# Patient Record
Sex: Male | Born: 1950 | Race: White | Hispanic: No | Marital: Married | Smoking: Former smoker
Health system: Southern US, Community
[De-identification: ages and names within clinical notes are randomized; demographics above are authoritative.]

## PROBLEM LIST (undated history)

## (undated) DIAGNOSIS — G459 Transient cerebral ischemic attack, unspecified: Secondary | ICD-10-CM

## (undated) DIAGNOSIS — E1140 Type 2 diabetes mellitus with diabetic neuropathy, unspecified: Secondary | ICD-10-CM

## (undated) DIAGNOSIS — R011 Cardiac murmur, unspecified: Secondary | ICD-10-CM

## (undated) DIAGNOSIS — I350 Nonrheumatic aortic (valve) stenosis: Secondary | ICD-10-CM

## (undated) DIAGNOSIS — K219 Gastro-esophageal reflux disease without esophagitis: Secondary | ICD-10-CM

## (undated) DIAGNOSIS — M7541 Impingement syndrome of right shoulder: Secondary | ICD-10-CM

## (undated) DIAGNOSIS — I872 Venous insufficiency (chronic) (peripheral): Secondary | ICD-10-CM

## (undated) DIAGNOSIS — N529 Male erectile dysfunction, unspecified: Secondary | ICD-10-CM

## (undated) DIAGNOSIS — I5189 Other ill-defined heart diseases: Secondary | ICD-10-CM

## (undated) DIAGNOSIS — IMO0001 Reserved for inherently not codable concepts without codable children: Secondary | ICD-10-CM

## (undated) DIAGNOSIS — R0989 Other specified symptoms and signs involving the circulatory and respiratory systems: Secondary | ICD-10-CM

## (undated) DIAGNOSIS — T4145XA Adverse effect of unspecified anesthetic, initial encounter: Secondary | ICD-10-CM

## (undated) DIAGNOSIS — E782 Mixed hyperlipidemia: Secondary | ICD-10-CM

## (undated) DIAGNOSIS — I639 Cerebral infarction, unspecified: Secondary | ICD-10-CM

## (undated) DIAGNOSIS — J189 Pneumonia, unspecified organism: Secondary | ICD-10-CM

## (undated) DIAGNOSIS — H547 Unspecified visual loss: Secondary | ICD-10-CM

## (undated) DIAGNOSIS — M1990 Unspecified osteoarthritis, unspecified site: Secondary | ICD-10-CM

## (undated) DIAGNOSIS — I2510 Atherosclerotic heart disease of native coronary artery without angina pectoris: Secondary | ICD-10-CM

## (undated) DIAGNOSIS — Z87891 Personal history of nicotine dependence: Secondary | ICD-10-CM

## (undated) DIAGNOSIS — R519 Headache, unspecified: Secondary | ICD-10-CM

## (undated) DIAGNOSIS — I6521 Occlusion and stenosis of right carotid artery: Secondary | ICD-10-CM

## (undated) DIAGNOSIS — G4733 Obstructive sleep apnea (adult) (pediatric): Secondary | ICD-10-CM

## (undated) DIAGNOSIS — R9439 Abnormal result of other cardiovascular function study: Secondary | ICD-10-CM

## (undated) DIAGNOSIS — E890 Postprocedural hypothyroidism: Secondary | ICD-10-CM

## (undated) DIAGNOSIS — R42 Dizziness and giddiness: Secondary | ICD-10-CM

## (undated) DIAGNOSIS — G709 Myoneural disorder, unspecified: Secondary | ICD-10-CM

## (undated) DIAGNOSIS — T8859XA Other complications of anesthesia, initial encounter: Secondary | ICD-10-CM

## (undated) DIAGNOSIS — T884XXA Failed or difficult intubation, initial encounter: Secondary | ICD-10-CM

## (undated) HISTORY — DX: Personal history of nicotine dependence: Z87.891

## (undated) HISTORY — DX: Unspecified osteoarthritis, unspecified site: M19.90

## (undated) HISTORY — DX: Mixed hyperlipidemia: E78.2

## (undated) HISTORY — DX: Other ill-defined heart diseases: I51.89

## (undated) HISTORY — DX: Male erectile dysfunction, unspecified: N52.9

## (undated) HISTORY — DX: Nonrheumatic aortic (valve) stenosis: I35.0

## (undated) HISTORY — PX: OTHER SURGICAL HISTORY: SHX169

## (undated) HISTORY — DX: Unspecified visual loss: H54.7

## (undated) HISTORY — DX: Venous insufficiency (chronic) (peripheral): I87.2

## (undated) HISTORY — PX: EYE SURGERY: SHX253

## (undated) HISTORY — DX: Abnormal result of other cardiovascular function study: R94.39

## (undated) HISTORY — DX: Impingement syndrome of right shoulder: M75.41

## (undated) HISTORY — DX: Gastro-esophageal reflux disease without esophagitis: K21.9

## (undated) HISTORY — DX: Type 2 diabetes mellitus with diabetic neuropathy, unspecified: E11.40

## (undated) HISTORY — DX: Atherosclerotic heart disease of native coronary artery without angina pectoris: I25.10

## (undated) HISTORY — DX: Other specified symptoms and signs involving the circulatory and respiratory systems: R09.89

## (undated) HISTORY — DX: Pneumonia, unspecified organism: J18.9

## (undated) HISTORY — PX: CATARACT EXTRACTION: SUR2

## (undated) HISTORY — DX: Obstructive sleep apnea (adult) (pediatric): G47.33

## (undated) HISTORY — PX: TRANSTHORACIC ECHOCARDIOGRAM: SHX275

## (undated) HISTORY — DX: Postprocedural hypothyroidism: E89.0

## (undated) HISTORY — PX: RETINAL DETACHMENT SURGERY: SHX105

## (undated) HISTORY — DX: Occlusion and stenosis of right carotid artery: I65.21

## (undated) HISTORY — DX: Transient cerebral ischemic attack, unspecified: G45.9

---

## 2000-01-04 HISTORY — PX: THYROID SURGERY: SHX805

## 2010-12-13 DIAGNOSIS — Z961 Presence of intraocular lens: Secondary | ICD-10-CM | POA: Insufficient documentation

## 2011-01-09 ENCOUNTER — Encounter: Payer: Self-pay | Admitting: *Deleted

## 2011-01-09 ENCOUNTER — Emergency Department (INDEPENDENT_AMBULATORY_CARE_PROVIDER_SITE_OTHER): Payer: PRIVATE HEALTH INSURANCE

## 2011-01-09 ENCOUNTER — Emergency Department (HOSPITAL_BASED_OUTPATIENT_CLINIC_OR_DEPARTMENT_OTHER)
Admission: EM | Admit: 2011-01-09 | Discharge: 2011-01-09 | Disposition: A | Discharge status: Home / Self Care | Payer: PRIVATE HEALTH INSURANCE | Attending: Emergency Medicine | Admitting: Emergency Medicine

## 2011-01-09 DIAGNOSIS — E119 Type 2 diabetes mellitus without complications: Secondary | ICD-10-CM | POA: Insufficient documentation

## 2011-01-09 DIAGNOSIS — R509 Fever, unspecified: Secondary | ICD-10-CM | POA: Insufficient documentation

## 2011-01-09 DIAGNOSIS — J111 Influenza due to unidentified influenza virus with other respiratory manifestations: Secondary | ICD-10-CM

## 2011-01-09 DIAGNOSIS — R05 Cough: Secondary | ICD-10-CM | POA: Insufficient documentation

## 2011-01-09 DIAGNOSIS — E7800 Pure hypercholesterolemia, unspecified: Secondary | ICD-10-CM | POA: Insufficient documentation

## 2011-01-09 DIAGNOSIS — R059 Cough, unspecified: Secondary | ICD-10-CM

## 2011-01-09 DIAGNOSIS — R0989 Other specified symptoms and signs involving the circulatory and respiratory systems: Secondary | ICD-10-CM

## 2011-01-09 DIAGNOSIS — E079 Disorder of thyroid, unspecified: Secondary | ICD-10-CM | POA: Insufficient documentation

## 2011-01-09 LAB — CBC
HCT: 40.1 % (ref 39.0–52.0)
MCH: 30.9 pg (ref 26.0–34.0)
MCV: 86.6 fL (ref 78.0–100.0)
RDW: 12.1 % (ref 11.5–15.5)
WBC: 9.1 10*3/uL (ref 4.0–10.5)

## 2011-01-09 LAB — COMPREHENSIVE METABOLIC PANEL
CO2: 27 mEq/L (ref 19–32)
Calcium: 8.2 mg/dL — ABNORMAL LOW (ref 8.4–10.5)
Creatinine, Ser: 0.9 mg/dL (ref 0.50–1.35)
GFR calc Af Amer: >90 mL/min (ref >90)
GFR calc non Af Amer: 89 mL/min — ABNORMAL LOW (ref >90)
Glucose, Bld: 231 mg/dL — ABNORMAL HIGH (ref 70–99)
Total Protein: 7 g/dL (ref 6.0–8.3)

## 2011-01-09 LAB — DIFFERENTIAL
Basophils Absolute: 0 10*3/uL (ref 0.0–0.1)
Eosinophils Absolute: 0.1 10*3/uL (ref 0.0–0.7)
Eosinophils Relative: 1 % (ref 0–5)
Lymphocytes Relative: 13 % (ref 12–46)
Lymphs Abs: 1.1 10*3/uL (ref 0.7–4.0)
Monocytes Absolute: 1.1 10*3/uL — ABNORMAL HIGH (ref 0.1–1.0)

## 2011-01-09 MED ORDER — OSELTAMIVIR PHOSPHATE 75 MG PO CAPS
75.0000 mg | ORAL_CAPSULE | Freq: Once | ORAL | Status: AC
  Administered 2011-01-09: 75 mg via ORAL
  Filled 2011-01-09: qty 1

## 2011-01-09 MED ORDER — OSELTAMIVIR PHOSPHATE 75 MG PO CAPS: 75.0000 mg | ORAL_CAPSULE | Freq: Two times a day (BID) | ORAL | Status: DC

## 2011-01-09 MED ORDER — ALBUTEROL SULFATE (5 MG/ML) 0.5% IN NEBU
5.0000 mg | INHALATION_SOLUTION | Freq: Once | RESPIRATORY_TRACT | Status: AC
  Administered 2011-01-09: 5 mg via RESPIRATORY_TRACT
  Filled 2011-01-09: qty 1

## 2011-01-09 MED ORDER — SODIUM CHLORIDE 0.9 % IV BOLUS (SEPSIS)
1000.0000 mL | Freq: Once | INTRAVENOUS | Status: AC
  Administered 2011-01-09: 1000 mL via INTRAVENOUS

## 2011-01-09 NOTE — ED Notes (Signed)
Patient states he has been experiencing congestion/cough since Wednesday, night chills, some loose stools, pale green mucus, took mucinex/ibuprofen last night around 20:00

## 2011-01-09 NOTE — ED Provider Notes (Signed)
History     CSN: [id number]  Arrival date & time 01/09/11  0818   First MD Initiated Contact with Patient 01/09/11 [phone]      Chief Complaint  Patient presents with  . URI    (Consider location/radiation/quality/duration/timing/severity/associated sxs/prior treatment) HPI Patient with cough, fever, uri symptoms began on Wednesday.  Cough with some productive tan sputum and some dyspnea.  Deep breaths cause coughing.  Patient with iddm with sugars running high 300s-400s.  Patient with decreased po, no nausea, vomiting, some loose stools.   Flu shot in October.  PMD- patient goes to Dr. Jenelle Mages at [facility] for diabetes management.   Past Medical History  Diagnosis Date  . Diabetes mellitus   . High cholesterol   . Thyroid disease     Past Surgical History  Procedure Date  . Thyroid surgery   . Lasix eye     No family history on file.  History  Substance Use Topics  . Smoking status: Former Games developer  . Smokeless tobacco: Not on file  . Alcohol Use: Yes      Review of Systems  All other systems reviewed and are negative.    Allergies  Review of patient's allergies indicates no known allergies.  Home Medications   Current Outpatient Rx  Name Route Sig Dispense Refill  . INSULIN ASPART 100 UNIT/ML [place] SOLN Subcutaneous Inject into the skin 3 (three) times daily before meals.      [name] LEVOTHROID PO Oral Take by mouth.      [name] SIMVASTATIN PO Oral Take by mouth.        BP 131/71  Pulse 108  Temp(Src) 100.4 F (38 C) (Oral)  Resp 20  SpO2 96%  Physical Exam  Nursing note and vitals reviewed. Constitutional: He is oriented to person, place, and time. He appears well-developed and well-nourished.  HENT:  Head: Normocephalic and atraumatic.  Right Ear: External ear normal.  Left Ear: External ear normal.  Nose: Nose normal.  Mouth/Throat: Oropharynx is clear and moist.  Eyes: Conjunctivae are normal. Pupils are equal, round, and reactive to light.       Right  eye to right of midline, finger counting only due to history of detached retina  Neck: Normal range of motion. Neck supple.  Cardiovascular: Normal rate, regular rhythm and normal heart sounds.   Pulmonary/Chest: Effort normal.       Coarse diffuse bs  Abdominal: Soft. Bowel sounds are normal.  Musculoskeletal: Normal range of motion.  Neurological: He is alert and oriented to person, place, and time. He has normal reflexes.  Skin: Skin is warm and dry.  Psychiatric: He has a normal mood and affect.    ED Course  Procedures (including critical care time)  Labs Reviewed - No data to display No results found.   No diagnosis found.    MDM  Patient without focal infiltrate on chest x-Gisela Lea. Labs are normal except for elevated blood sugar. He is given a liter of normal saline here. He is advised to watch his sugars more closely. He is able to take by mouth without difficulty. Given his influenza-like symptoms space and his diabetes, he is started on Tamiflu. He is advised that close followup with his primary care DrMarland Kitchen       Hilario Quarry, MD 01/09/11 [phone]

## 2011-01-12 ENCOUNTER — Ambulatory Visit (INDEPENDENT_AMBULATORY_CARE_PROVIDER_SITE_OTHER): Payer: PRIVATE HEALTH INSURANCE | Admitting: Family Medicine

## 2011-01-12 ENCOUNTER — Encounter: Payer: Self-pay | Admitting: Family Medicine

## 2011-01-12 DIAGNOSIS — E039 Hypothyroidism, unspecified: Secondary | ICD-10-CM

## 2011-01-12 DIAGNOSIS — R7309 Other abnormal glucose: Secondary | ICD-10-CM

## 2011-01-12 DIAGNOSIS — E109 Type 1 diabetes mellitus without complications: Secondary | ICD-10-CM

## 2011-01-12 DIAGNOSIS — R739 Hyperglycemia, unspecified: Secondary | ICD-10-CM

## 2011-01-12 DIAGNOSIS — R6889 Other general symptoms and signs: Secondary | ICD-10-CM

## 2011-01-12 DIAGNOSIS — J111 Influenza due to unidentified influenza virus with other respiratory manifestations: Secondary | ICD-10-CM

## 2011-01-12 LAB — POCT URINALYSIS DIPSTICK
Ketones, UA: 15
Leukocytes, UA: NEGATIVE
Protein, UA: NEGATIVE
pH, UA: 5.5

## 2011-01-12 LAB — COMPREHENSIVE METABOLIC PANEL
Albumin: 3.5 g/dL (ref 3.5–5.2)
Alkaline Phosphatase: 91 U/L (ref 39–117)
CO2: 31 mEq/L (ref 19–32)
Chloride: 103 mEq/L (ref 96–112)
Glucose, Bld: 288 mg/dL — ABNORMAL HIGH (ref 70–99)
Potassium: 5 mEq/L (ref 3.5–5.1)
Sodium: 140 mEq/L (ref 135–145)
Total Protein: 7 g/dL (ref 6.0–8.3)

## 2011-01-12 LAB — CBC WITH DIFFERENTIAL/PLATELET
Eosinophils Relative: 3.7 % (ref 0.0–5.0)
Lymphocytes Relative: 16.5 % (ref 12.0–46.0)
Monocytes Absolute: 0.7 10*3/uL (ref 0.1–1.0)
Monocytes Relative: 6.4 % (ref 3.0–12.0)
Neutrophils Relative %: 72.9 % (ref 43.0–77.0)
Platelets: 243 10*3/uL (ref 150.0–400.0)
WBC: 10.4 10*3/uL (ref 4.5–10.5)

## 2011-01-12 LAB — TSH: TSH: 3.44 u[IU]/mL (ref 0.35–5.50)

## 2011-01-12 MED ORDER — ALBUTEROL SULFATE HFA 108 (90 BASE) MCG/ACT IN AERS: 2.0000 | INHALATION_SPRAY | Freq: Four times a day (QID) | RESPIRATORY_TRACT | Status: DC | PRN

## 2011-01-12 MED ORDER — AZITHROMYCIN 250 MG PO TABS: ORAL_TABLET | ORAL | Status: DC

## 2011-01-12 MED ORDER — BENZONATATE 200 MG PO CAPS: 200.0000 mg | ORAL_CAPSULE | Freq: Three times a day (TID) | ORAL | Status: AC | PRN

## 2011-01-13 ENCOUNTER — Telehealth: Payer: Self-pay | Admitting: Family Medicine

## 2011-01-13 ENCOUNTER — Encounter: Payer: Self-pay | Admitting: Family Medicine

## 2011-01-13 DIAGNOSIS — E039 Hypothyroidism, unspecified: Secondary | ICD-10-CM | POA: Insufficient documentation

## 2011-01-13 DIAGNOSIS — E1049 Type 1 diabetes mellitus with other diabetic neurological complication: Secondary | ICD-10-CM | POA: Insufficient documentation

## 2011-01-13 DIAGNOSIS — J111 Influenza due to unidentified influenza virus with other respiratory manifestations: Secondary | ICD-10-CM | POA: Insufficient documentation

## 2011-01-13 NOTE — Progress Notes (Signed)
Office Note 01/13/2011  CC:  Chief Complaint  Patient presents with  . Establish Care    follow up cough, flu    HPI:  [name] is a 61 y.o. White male who is here to establish care, discuss acute flu-like illness as well. Patient's most recent primary MD: none.  He has seen a [facility] MD on occasion for acute illness, has seen an endocrinologist on and off over the years (lately more off than on)--latest endocrinologist is a Dr. Jenelle Mages in [place]. Old records from 01/09/11 E.D visit were reviewed prior to or during today's visit.  Pt with IDDM dx'd around age 78; had acute onset of severe cough, fever, loss of voice, and body aches 7d ago.  Slight diarrhea, no vomiting or abd pain.  Glucoses 300-400s.  Sx's persisted so he went to the ED 3 days ago and was dx'd with FLI and rx'd tamiflu. He feels like he has started to improve slightly over the last 24h.  Still no appetite.  Mucinex DM taken early in illness but none lately. Sounds like he has been either "lost to follow up" by his specialists or has fallen through the cracks regarding medical care, etc. Has not had a PMD, reason unclear other than he works all the time and doesn't make time for his health needs very well.  Past Medical History  Diagnosis Date  . Diabetes mellitus Dx'd age 4    Novolog via insulin pump; sub-optimal control x years  . High cholesterol     Takes 1/2 of 80mg  atorvastatin daily  . Hypothyroidism     Thyroidectomy 2002  . Erectile dysfunction     Sees urologist in [place]  . Retinal detachment, right     No vision in right eye  . History of tobacco abuse     Quit about 1990 (has 35 pack-yr hx)  . Heart murmur, systolic     Past Surgical History  Procedure Date  . Thyroid surgery 2002  . Lasix eye   . Eye surgery     Multiple laser surgeries for diabetic retinopathy, also cataract surgery OU.  [name] Retinal detachment surgery     Right eye    Family History  Problem Relation Age of Onset    . Cancer Mother     lung cancer  . Alcohol abuse Father   . Cancer Father     laryngeal cancer  . Cancer Brother     oldest brother had lung cancer and melanoma    History   Social History  . Marital Status: Married    Spouse Name: N/A    Number of Children: N/A  . Years of Education: N/A   Occupational History  . Not on file.   Social History Main Topics  . Smoking status: Former Games developer  . Smokeless tobacco: Never Used  . Alcohol Use: Yes  . Drug Use: No  . Sexually Active: Not on file   Other Topics Concern  . Not on file   Social History Narrative   Divorced x1, remarried.  Has 3 daughters from first marriage.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   MEDS: azithromycin, dulera, and tessalon listed below were rx'd at today's visit. Outpatient Encounter Prescriptions as of 01/12/2011  Medication Sig Dispense Refill  . atorvastatin (LIPITOR) 80 MG tablet Take  40 mg by mouth daily.       . insulin aspart (NOVOLOG) 100 UNIT/ML injection by Continuous infusion (non-IV) route.      [name] levothyroxine (SYNTHROID, LEVOTHROID) 137 MCG tablet Take 137 mcg by mouth daily.      [name] loperamide (IMODIUM) 2 MG capsule Take 2 mg by mouth as needed.      . mometasone-formoterol (DULERA) 100-5 MCG/ACT AERO Inhale 2 puffs into the lungs 2 (two) times daily.      [name] oseltamivir (TAMIFLU) 75 MG capsule Take 1 capsule (75 mg total) by mouth every 12 (twelve) hours.  10 capsule  0  . tadalafil (CIALIS) 20 MG tablet Take 20 mg by mouth daily as needed.      [name] albuterol (VENTOLIN HFA) 108 (90 BASE) MCG/ACT inhaler Inhale 2 puffs into the lungs every 6 (six) hours as needed for wheezing.  1 Inhaler  1  . azithromycin (ZITHROMAX) 250 MG tablet 2 tabs po qd x 1d, then 1 tab po qd x 4d  6 each  0  . benzonatate (TESSALON) 200  MG capsule Take 1 capsule (200 mg total) by mouth 3 (three) times daily as needed for cough.  30 capsule  1  . DISCONTD: insulin aspart (NOVOLOG) 100 UNIT/ML injection Inject into the skin 3 (three) times daily before meals.       [name] DISCONTD: Levothyroxine Sodium (LEVOTHROID PO) Take by mouth.       . DISCONTD: SIMVASTATIN PO Take by mouth.          No Known Allergies  ROS Review of Systems  Constitutional:       See hpi   HENT: Negative for ear pain, neck stiffness and dental problem.        See HPI  Eyes: Negative for discharge, redness and visual disturbance.  Respiratory:       See HPI  Cardiovascular: Negative for chest pain, palpitations and leg swelling.  Gastrointestinal: Positive for diarrhea (mild). Negative for nausea, vomiting, abdominal pain and blood in stool.  Genitourinary: Negative for dysuria, urgency, frequency, hematuria, flank pain and difficulty urinating.  Musculoskeletal: Negative for myalgias, back pain, joint swelling and arthralgias.  Skin: Negative for pallor and rash.  Neurological: Negative for dizziness, speech difficulty, weakness and headaches.  Hematological: Negative for adenopathy. Does not bruise/bleed easily.  Psychiatric/Behavioral: Negative for confusion and dysphoric mood. The patient is not nervous/anxious.     PE; Blood pressure 115/68, pulse 100, temperature 98.4 F (36.9 C), temperature source Temporal, weight 199 lb (90.266 kg), SpO2 94.00%.  Wt is 6 lb down from his reported baseline of 205 Gen: Alert, tired appearing but in NAD.  Patient is oriented to person, place, time, and situation.  Displays lucid thought and speech. VS: noted--normal. HEENT: eyes without injection, drainage, or swelling.  Ears: EACs clear, TMs with normal light reflex and landmarks.  Nose: Clear rhinorrhea, with some dried, crusty exudate adherent to mildly injected mucosa.  No purulent d/c.  No paranasal sinus TTP.  No facial swelling.  Throat and mouth without  focal lesion.  No pharyngial swelling, erythema, or exudate.   Neck: supple, no LAD.   LUNGS: CTA bilat, nonlabored resps.   CV: RRR, 1-2/6 systolic murmur heard best at apex EXT: no c/c/e SKIN: no rash  Pertinent labs:  None today  ASSESSMENT AND PLAN:   Influenza-like illness Not improving at day 7. Will start empiric abx: azithromycin x 5d.   Tessalon perles tid prn cough. Dulera 200/5,  2 puffs bid-sample given to last 2 wks.  Inhaler education done by nurse. Finish tamiflu.  DM type 1 (diabetes mellitus, type 1) Poor control, noncompliance, inadequate medical f/u, etc. Needs assistance with his insulin pump so he can at least know how to change his basal rate and have a good grasp of it's general functions. We called his diabetes educators at [place] and got an appt for him tomorrow at 9AM (they are the ones who actually taught him how to use the pump originally).  I recommended he start with at least a 15% increase in his basal novolog rate. I have ordered CBC, HbA1c, CMET, and TSH today.   Hypothyroidism Needs TSH monitoring: ordered today.   Heart murmur: will discuss further at future visits.  Colon cancer screening: has not had this.  Will discuss at future visit.  Prostate cancer screening: pt reports having this done at his urologist's, whom he sees for erectile dysfunction, in the summer of 2012--pt reports normal results.  Spent 45 min with pt today, with >50% of this time spent in counseling and care coordination for his diabetes  Return in about 1 week (around 01/19/2011) for f/u ILI and DM.

## 2011-01-13 NOTE — Assessment & Plan Note (Addendum)
Not improving at day 7. Will start empiric abx: azithromycin x 5d.   Tessalon perles tid prn cough. Dulera 200/5, 2 puffs bid-sample given to last 2 wks.  Inhaler education done by nurse. Finish tamiflu.

## 2011-01-13 NOTE — Telephone Encounter (Signed)
Pls request records from Dr. Jenelle Mages, and endocrinologist (male) in [place].  thx.

## 2011-01-13 NOTE — Assessment & Plan Note (Signed)
Poor control, noncompliance, inadequate medical f/u, etc. Needs assistance with his insulin pump so he can at least know how to change his basal rate and have a good grasp of it's general functions. We called his diabetes educators at [place] and got an appt for him tomorrow at 9AM (they are the ones who actually taught him how to use the pump originally).  I recommended he start with at least a 15% increase in his basal novolog rate. I have ordered CBC, HbA1c, CMET, and TSH today.

## 2011-01-13 NOTE — Assessment & Plan Note (Signed)
Needs TSH monitoring: ordered today.

## 2011-01-14 NOTE — Telephone Encounter (Signed)
Faxed request 01/14/11 °

## 2011-01-19 ENCOUNTER — Encounter: Payer: Self-pay | Admitting: Family Medicine

## 2011-01-19 ENCOUNTER — Ambulatory Visit (INDEPENDENT_AMBULATORY_CARE_PROVIDER_SITE_OTHER): Payer: PRIVATE HEALTH INSURANCE | Admitting: Family Medicine

## 2011-01-19 DIAGNOSIS — R6889 Other general symptoms and signs: Secondary | ICD-10-CM

## 2011-01-19 DIAGNOSIS — G609 Hereditary and idiopathic neuropathy, unspecified: Secondary | ICD-10-CM

## 2011-01-19 DIAGNOSIS — E1149 Type 2 diabetes mellitus with other diabetic neurological complication: Secondary | ICD-10-CM

## 2011-01-19 DIAGNOSIS — E1142 Type 2 diabetes mellitus with diabetic polyneuropathy: Secondary | ICD-10-CM | POA: Insufficient documentation

## 2011-01-19 DIAGNOSIS — E109 Type 1 diabetes mellitus without complications: Secondary | ICD-10-CM

## 2011-01-19 DIAGNOSIS — J111 Influenza due to unidentified influenza virus with other respiratory manifestations: Secondary | ICD-10-CM

## 2011-01-19 DIAGNOSIS — G2581 Restless legs syndrome: Secondary | ICD-10-CM

## 2011-01-19 MED ORDER — GABAPENTIN 300 MG PO CAPS: 300.0000 mg | ORAL_CAPSULE | Freq: Three times a day (TID) | ORAL | Status: DC

## 2011-01-19 NOTE — Assessment & Plan Note (Signed)
Lab Results  Component Value Date   HGBA1C 9.5* 01/12/2011   Working on slow up-titration of basal novolog insulin rate in his pump.  Discussed goal A1c of 6.5-7%. We'll check his urine microalb/cr today. He'll likely check in with his endocrinologist annually.  I'll see him again in 79mo and will check fasting lipids and recheck HbA1c.

## 2011-01-19 NOTE — Assessment & Plan Note (Signed)
Seems to have more daytime sx's than night time.  He also has excessive leg movement during sleep but it doesn't impair sleep initiation or maintenance.   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

## 2011-01-19 NOTE — Assessment & Plan Note (Signed)
Start neurontin 300mg  qd and titrate up over the next week to 300mg  tid. Therapeutic expectations and side effect profile of medication discussed today.  Patient's questions answered.

## 2011-01-19 NOTE — Assessment & Plan Note (Signed)
Resolving.  Expect residual cough to gradually subside over the next 1-2 wks.

## 2011-01-19 NOTE — Progress Notes (Signed)
OFFICE NOTE  01/19/2011  CC:  Chief Complaint  Patient presents with  . Follow-up    flu-like-illness, DM     HPI: Patient is a 61 y.o. Caucasian male who is here for f/u recent FLI, poor DM control. Feeling much improved, just some lingering cough and gravely voice.  Finished course of zithromax. Went to diab ed center in [place] as we arranged and he had his basal increased by 10% and his glucoses are coming down into the 200s.  He feels more confident now about adjusting his basal and bolus dosing with his pump, has f/u appt at diab ed center for future education as well.   Describes vague sensory paresthesias in both lower legs, some mild pressure/pain on bottom of feet, R>L, better when he gets up and walks around.  Vague unconfortable sensation in LL's diffusely, worse at rest, better with ambulation.  Interestingly, it doesn't bother him much when trying to sleep.  He says he moves his legs around a lot when sleeping per wife's report.  Has hx of OSA but could not tolerate CPAP.  Pertinent PMH:  Past Medical History  Diagnosis Date  . Diabetes mellitus Dx'd age 61    Novolog via insulin pump; sub-optimal control x years  . High cholesterol     Takes 1/2 of 80mg  atorvastatin daily  . Hypothyroidism     Thyroidectomy 2002  . Erectile dysfunction     Sees urologist in [place]  . Diabetic retinopathy     Hx of retinal detachment on right.  No vision in right eye  . History of tobacco abuse     Quit about 1990 (has 35 pack-yr hx)  . Heart murmur, systolic   . OSA (obstructive sleep apnea)     too claustrophobic to tolerate CPAP   Past surgical, social, and family history reviewed and no changes noted since last office visit.  MEDS:  Outpatient Prescriptions Prior to Visit  Medication Sig Dispense Refill  . atorvastatin (LIPITOR) 80 MG tablet Take 40 mg by mouth daily.       . benzonatate (TESSALON) 200 MG capsule Take 1 capsule (200 mg total) by mouth 3 (three) times daily as  needed for cough.  30 capsule  1  . insulin aspart (NOVOLOG) 100 UNIT/ML injection by Continuous infusion (non-IV) route.      [name] levothyroxine (SYNTHROID, LEVOTHROID) 137 MCG tablet Take 137 mcg by mouth daily.      [name] loperamide (IMODIUM) 2 MG capsule Take 2 mg by mouth as needed.      . mometasone-formoterol (DULERA) 100-5 MCG/ACT AERO Inhale 2 puffs into the lungs 2 (two) times daily.      . tadalafil (CIALIS) 20 MG tablet Take 20 mg by mouth daily as needed.      [name] albuterol (VENTOLIN HFA) 108 (90 BASE) MCG/ACT inhaler Inhale 2 puffs into the lungs every 6 (six) hours as needed for wheezing.  1 Inhaler  1  . azithromycin (ZITHROMAX) 250 MG tablet 2 tabs po qd x 1d, then 1 tab po qd x 4d  6 each  0  . oseltamivir (TAMIFLU) 75 MG capsule Take 1 capsule (75 mg total) by mouth every 12 (twelve) hours.  10 capsule  0    PE: Blood pressure 118/70, pulse 89, weight 199 lb (90.266 kg). His wt is unchanged from last week. Gen: Alert, well appearing.  Patient is oriented to person, place, time, and situation. ENT: Ears: EACs clear, normal epithelium.  TMs  with good light reflex and landmarks bilaterally.  Eyes: no injection, icteris, swelling, or exudate.  EOMI, PERRLA. Nose: no drainage or turbinate edema/swelling.  No injection or focal lesion.  Mouth: lips without lesion/swelling.  Oral mucosa pink and moist.  Dentition intact and without obvious caries or gingival swelling.  Oropharynx without erythema, exudate, or swelling.  Neck - No masses or thyromegaly or limitation in range of motion CV: RRR, no m/r/g.   LUNGS: CTA bilat, nonlabored resps, good aeration in all lung fields. EXT: no clubbing or cyanosis.  Trace edema bilat, a bit more hyperpigmentation/hemosiderin changes of venous stasis edema/dermatitis on right leg than left.  Full diabetic foot exam done: diminished sensation diffusely in both feet and lower legs with monofilament testing and with position sense.  Labs: none today    Chemistry      Component Value Date/Time   NA 140 01/12/2011 1136   K 5.0 01/12/2011 1136   CL 103 01/12/2011 1136   CO2 31 01/12/2011 1136   BUN 11 01/12/2011 1136   CREATININE 1.1 01/12/2011 1136      Component Value Date/Time   CALCIUM 8.5 01/12/2011 1136   ALKPHOS 91 01/12/2011 1136   AST 26 01/12/2011 1136   ALT 20 01/12/2011 1136   BILITOT 0.9 01/12/2011 1136     Lab Results  Component Value Date   WBC 10.4 01/12/2011   HGB 14.1 01/12/2011   HCT 40.6 01/12/2011   MCV 91.9 01/12/2011   PLT 243.0 01/12/2011   Lab Results  Component Value Date   TSH 3.44 01/12/2011   Lab Results  Component Value Date   HGBA1C 9.5* 01/12/2011    IMPRESSION AND PLAN: DM type 1 causing neurological disease, not at goal Lab Results  Component Value Date   HGBA1C 9.5* 01/12/2011   Working on slow up-titration of basal novolog insulin rate in his pump.  Discussed goal A1c of 6.5-7%. We'll check his urine microalb/cr today. He'll likely check in with his endocrinologist annually.  I'll see him again in 47mo and will check fasting lipids and recheck HbA1c.  Influenza-like illness Resolving.  Expect residual cough to gradually subside over the next 1-2 wks.  Diabetic peripheral neuropathy Start neurontin 300mg  qd and titrate up over the next week to 300mg  tid. Therapeutic expectations and side effect profile of medication discussed today.  Patient's questions answered.   Restless legs syndrome Seems to have more daytime sx's than night time.  He also has excessive leg movement during sleep but it doesn't impair sleep initiation or maintenance.   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      FOLLOW UP: 47mo

## 2011-03-21 ENCOUNTER — Encounter: Payer: Self-pay | Admitting: Family Medicine

## 2011-03-21 DIAGNOSIS — E785 Hyperlipidemia, unspecified: Secondary | ICD-10-CM | POA: Insufficient documentation

## 2011-04-20 ENCOUNTER — Ambulatory Visit: Payer: PRIVATE HEALTH INSURANCE | Admitting: Family Medicine

## 2011-04-21 ENCOUNTER — Encounter: Payer: Self-pay | Admitting: Family Medicine

## 2011-04-21 ENCOUNTER — Ambulatory Visit (INDEPENDENT_AMBULATORY_CARE_PROVIDER_SITE_OTHER): Payer: PRIVATE HEALTH INSURANCE | Admitting: Family Medicine

## 2011-04-21 VITALS — BP 129/81 | HR 91 | Temp 97.9°F | Wt 207.0 lb

## 2011-04-21 DIAGNOSIS — E1049 Type 1 diabetes mellitus with other diabetic neurological complication: Secondary | ICD-10-CM

## 2011-04-21 DIAGNOSIS — Z23 Encounter for immunization: Secondary | ICD-10-CM

## 2011-04-21 DIAGNOSIS — E1065 Type 1 diabetes mellitus with hyperglycemia: Secondary | ICD-10-CM

## 2011-04-21 DIAGNOSIS — Z1211 Encounter for screening for malignant neoplasm of colon: Secondary | ICD-10-CM

## 2011-04-21 DIAGNOSIS — E785 Hyperlipidemia, unspecified: Secondary | ICD-10-CM

## 2011-04-21 DIAGNOSIS — E1142 Type 2 diabetes mellitus with diabetic polyneuropathy: Secondary | ICD-10-CM

## 2011-04-21 DIAGNOSIS — R5381 Other malaise: Secondary | ICD-10-CM

## 2011-04-21 DIAGNOSIS — R5383 Other fatigue: Secondary | ICD-10-CM

## 2011-04-21 LAB — LIPID PANEL
Cholesterol: 245 mg/dL — ABNORMAL HIGH (ref 0–200)
HDL: 69.6 mg/dL (ref >39.00)
VLDL: 10 mg/dL (ref 0.0–40.0)

## 2011-04-21 LAB — HEMOGLOBIN A1C: Hgb A1c MFr Bld: 9.4 % — ABNORMAL HIGH (ref 4.6–6.5)

## 2011-04-21 LAB — VITAMIN B12: Vitamin B-12: 323 pg/mL (ref 211–911)

## 2011-04-21 NOTE — Assessment & Plan Note (Signed)
Likely related to poor control of DM, some deconditioning, mild emotional/mental apathy. However, will check vit B12 and testosterone levels today. His Hb and TSH were normal 01/2011 and this fatigue/lack of motivation has been a longstanding problem.

## 2011-04-21 NOTE — Assessment & Plan Note (Signed)
Due for recheck lipids today--he is fasting.

## 2011-04-21 NOTE — Assessment & Plan Note (Signed)
Control fair at best.   Check HbA1c today. Neuro sx's in feet not bothering him, he decided against trial of neurontin at this time. He'll try to be more consistent with bolusing his apidra.  We'll adjust basal rate if needed based on A1c result.

## 2011-04-21 NOTE — Assessment & Plan Note (Signed)
Refer to GI--ordered today.

## 2011-04-21 NOTE — Progress Notes (Signed)
Addended by: Luisa Dago on: 04/21/2011 08:58 AM   Modules accepted: Orders

## 2011-04-21 NOTE — Progress Notes (Signed)
OFFICE VISIT  04/21/2011   CC:  Chief Complaint  Patient presents with  . Follow-up    3 month, DM; no problems     HPI:    Patient is a 61 y.o. Caucasian male who presents for f/u DM 1, peripheral neuropathy. Feeling pretty good.  Just has c/o some longstanding "lack of get up and go", seems to have a lot he wants/needs to do but lacks the motivation/energy to get up and do it.  Denies outright depression. Reiterates he's not a person who wants to be on a bunch of meds unless he absolutely has to.  Says glucoses "okay" but very vague, cites some 200-300 numbers last 24h "b/c I forgot to bolus when I was supposed to" and apparently this is not that rare of an occurrence. Currently his apidra pump is set at basal rate of 0.8 U/hr.  Says he never filled the neurontin that I rx'd for his peripheral neuropathy sx's--says the drug store didn't have it ready when he needed it and he had to go and then he never picked it up.  Says his legs feel ok except restlessness when he's still and he says this really doesn't impair sleep so he's ok with doing nothing about it for now.  He says he is ready for colon cancer screening referral now.  Says he thinks he has gotten pneumovax in the distant past possibly (>5 yrs ago) but be for sure.    Past Medical History  Diagnosis Date  . Diabetes mellitus Dx'd age 25    Novolog via insulin pump; sub-optimal control x years  . High cholesterol     Takes 1/2 of 80mg  atorvastatin daily  . Hypothyroidism     Thyroidectomy 2002; multinodular goiter  . Erectile dysfunction     Se[place] urologist in [place]  . Diabetic retinopathy     Proliferative: Hx of retinal detachment on right-light perception only in right eye  . History of tobacco abuse     Quit about 1990 (has 35 pack-yr hx)  . Heart murmur, systolic   . OSA (obstructive sleep apnea)     too claustrophobic to tolerate CPAP  . Impaired vision     right eye light perception only; hx of retinal  detachment.  . Recurrent pneumonia   . GERD (gastroesophageal reflux disease)   . Venous insufficiency   . Diastolic dysfunction 2007    TEE with diastolic dysfunction, EF 74%.  . Diabetic neuropathy     Past Surgical History  Procedure Date  . Thyroid surgery 2002  . Lasix eye   . Eye surgery     Multiple laser surgeries for diabetic retinopathy, also cataract surgery OU.  [name] Retinal detachment surgery     Right eye  . Cataract extraction     left    Outpatient Prescriptions Prior to Visit  Medication Sig Dispense Refill  . albuterol (VENTOLIN HFA) 108 (90 BASE) MCG/ACT inhaler Inhale 2 puffs into the lungs every 6 (six) hours as needed for wheezing.  1 Inhaler  1  . atorvastatin (LIPITOR) 80 MG tablet Take 40 mg by mouth daily.       . insulin aspart (NOVOLOG) 100 UNIT/ML injection by Continuous infusion (non-IV) route.      [name] levothyroxine (SYNTHROID, LEVOTHROID) 137 MCG tablet Take 137 mcg by mouth daily.      [name] loperamide (IMODIUM) 2 MG capsule Take 2 mg by mouth as needed.      . tadalafil (CIALIS) 20  MG tablet Take 20 mg by mouth daily as needed.      . mometasone-formoterol (DULERA) 100-5 MCG/ACT AERO Inhale 2 puffs into the lungs 2 (two) times daily.      [name] gabapentin (NEURONTIN) 300 MG capsule Take 1 capsule (300 mg total) by mouth 3 (three) times daily.  90 capsule  3    Allergies  Allergen Reactions  . Tape Rash    Adhesive tape.  (Paper tape OK)    ROS As per HPI  PE: Blood pressure 129/81, pulse 91, temperature 97.9 F (36.6 C), temperature source Temporal, weight 207 lb (93.895 kg). Gen: Alert, well appearing.  Patient is oriented to person, place, time, and situation. CV: RRR, no m/r/g.   LUNGS: CTA bilat, nonlabored resps, good aeration in all lung fields.   LABS:  None today  IMPRESSION AND PLAN:  DM type 1 causing neurological disease, not at goal Control fair at best.   Check HbA1c today. Neuro sx's in feet not bothering him, he decided  against trial of neurontin at this time. He'll try to be more consistent with bolusing his apidra.  We'll adjust basal rate if needed based on A1c result.  Hyperlipidemia Due for recheck lipids today--he is fasting.  Fatigue Likely related to poor control of DM, some deconditioning, mild emotional/mental apathy. However, will check vit B12 and testosterone levels today. His Hb and TSH were normal 01/2011 and this fatigue/lack of motivation has been a longstanding problem.  Colon cancer screening Refer to GI--ordered today.   Preventative health: pneumovax given today.  FOLLOW UP: Return in about 4 months (around 08/21/2011) for f/u DM 1, hyperlipidemia, fatigue.

## 2011-04-22 ENCOUNTER — Encounter: Payer: Self-pay | Admitting: Gastroenterology

## 2011-05-09 ENCOUNTER — Encounter: Payer: Self-pay | Admitting: Family Medicine

## 2011-05-23 ENCOUNTER — Telehealth: Payer: Self-pay | Admitting: *Deleted

## 2011-05-23 DIAGNOSIS — G4733 Obstructive sleep apnea (adult) (pediatric): Secondary | ICD-10-CM

## 2011-05-23 NOTE — Telephone Encounter (Signed)
Message left at [facility] to return my call regarding titration requirements.

## 2011-05-23 NOTE — Telephone Encounter (Signed)
Pls check with [facility] and see if their sleep lab will accept the sleep study result from 1995 before doing their titration study or will they require a repeat of the sleep study?--thx

## 2011-05-23 NOTE — Telephone Encounter (Signed)
Pt called wanting to know if we received report from [facility] regarding sleep study.  Advised we have received report stating that he has mild to moderate sleep apnea, but he needs a CPAP titration.  Pt states he never had titration and has never had CPAP machine, but feels that he needs to do something now.   Please advise next step-repeat sleep study, overnight titration or auto titrate with home health company.

## 2011-05-26 NOTE — Telephone Encounter (Signed)
Patient called back to check to see if we found out anything about the CPAP machine. I advised that Judeth Cornfield had left a message at [facility] but had not heard back. He said that if he does have to have another sleep study done to please set it in [place] since he lives in [place].

## 2011-06-07 NOTE — Telephone Encounter (Signed)
I called [facility] earlier today and left another message.  No return call by the end of the day.  Pt would prefer not to have to start over, but is getting tired of waiting on [facility].  At this point can we see if a home auto titration can be done or if they will require full sleep study.  Please advise.

## 2011-06-07 NOTE — Telephone Encounter (Signed)
Please call [name] and ask if they will do home auto titration based on a sleep study report from 1995 that was done at [facility] won't call us back).  I suspect the answer is no, but want to find out before ordering a new sleep study.  Thx

## 2011-06-08 ENCOUNTER — Telehealth (HOSPITAL_BASED_OUTPATIENT_CLINIC_OR_DEPARTMENT_OTHER): Payer: Self-pay | Admitting: Family Medicine

## 2011-06-08 NOTE — Telephone Encounter (Signed)
Because the time lapse from a previous study is greater than 5 years, [facility] requires a new study before approving prescription for a CPAP titration and replacement of a CPAP devise. In addition, the patient's physiology has likely changed greatly since 1995 and a repeat study would be needed to capture the patient's current state.  We recommend an order for a Split Night study so that we can assess the patient's condition for two hours and then intervene with the appropriate CPAP therapy. We can also forward the results per your permission to [facility] so they can process the CPAP prescription once the study has been finalized.

## 2011-06-13 ENCOUNTER — Encounter: Payer: PRIVATE HEALTH INSURANCE | Admitting: Gastroenterology

## 2011-06-14 NOTE — Telephone Encounter (Signed)
Ok.  I just entered orders for sleep study at [facility] (+referral).  Needs to be split-night.--thx

## 2011-06-14 NOTE — Telephone Encounter (Signed)
Unfortunately the sleep study is too old

## 2011-06-17 ENCOUNTER — Other Ambulatory Visit: Payer: Self-pay | Admitting: Family Medicine

## 2011-06-17 MED ORDER — INSULIN ASPART 100 UNIT/ML ~~LOC~~ SOLN: SUBCUTANEOUS | Status: DC

## 2011-06-17 NOTE — Telephone Encounter (Signed)
RX sent

## 2011-07-06 ENCOUNTER — Ambulatory Visit (HOSPITAL_BASED_OUTPATIENT_CLINIC_OR_DEPARTMENT_OTHER): Payer: PRIVATE HEALTH INSURANCE | Attending: Family Medicine

## 2011-07-06 VITALS — Ht 74.0 in | Wt 210.0 lb

## 2011-07-06 DIAGNOSIS — G4733 Obstructive sleep apnea (adult) (pediatric): Secondary | ICD-10-CM | POA: Insufficient documentation

## 2011-07-06 DIAGNOSIS — Z79899 Other long term (current) drug therapy: Secondary | ICD-10-CM | POA: Insufficient documentation

## 2011-07-06 DIAGNOSIS — Z7901 Long term (current) use of anticoagulants: Secondary | ICD-10-CM | POA: Insufficient documentation

## 2011-07-06 DIAGNOSIS — I4891 Unspecified atrial fibrillation: Secondary | ICD-10-CM | POA: Insufficient documentation

## 2011-07-19 ENCOUNTER — Telehealth: Payer: Self-pay | Admitting: Family Medicine

## 2011-07-19 DIAGNOSIS — G4733 Obstructive sleep apnea (adult) (pediatric): Secondary | ICD-10-CM

## 2011-07-19 NOTE — Telephone Encounter (Signed)
Patient is checking to see if we got the results from his sleep study

## 2011-07-19 NOTE — Telephone Encounter (Signed)
Spoke to [place] at [facility].  Study has not been read yet.  He will move to top of stack and it should be read today or tomorrow.  Pt is notified of this.

## 2011-07-23 DIAGNOSIS — I4891 Unspecified atrial fibrillation: Secondary | ICD-10-CM

## 2011-07-23 DIAGNOSIS — Z7901 Long term (current) use of anticoagulants: Secondary | ICD-10-CM

## 2011-07-23 DIAGNOSIS — Z79899 Other long term (current) drug therapy: Secondary | ICD-10-CM

## 2011-07-23 DIAGNOSIS — G4733 Obstructive sleep apnea (adult) (pediatric): Secondary | ICD-10-CM

## 2011-07-24 NOTE — Procedures (Signed)
NAME:  [name], [name] NO.:  [id number]  MEDICAL RECORD NO.:  [id number]          PATIENT TYPE:  OUT  LOCATION:  SLEEP CENTER                 [facility]ACILITY[facility]  [facility]  PHYSICIAN:  Barbaraann Share, MD,FCCPDATE OF BIRTH:  05/16/48  DATE OF STUDY:                           NOCTURNAL POLYSOMNOGRAM  REFERRING PHYSICIAN:  [name], MD  LOCATION:  Sleep lab.  INDICATION FOR STUDY:  Hypersomnia with sleep apnea.  EPWORTH SLEEPINESS SCORE:  20.  SLEEP ARCHITECTURE:  The patient had a total sleep time of 300 minutes with no slow-wave sleep and decreased quantity of REM.  Sleep onset latency was normal at 2 minutes, and REM onset was very rapid at 10 minutes.  Sleep efficiency was 97% during the diagnostic portion of the study and only 75% during the titration portion of the study.  RESPIRATORY DATA:  The patient underwent a split night protocol where he was found to have 48 obstructive events in the first 124 minutes of sleep.  This gave him an apnea-hypopnea index of 23 events per hour during the diagnostic portion of the study.  The events occurred in all body positions and there was loud snoring noted throughout.  By split night protocol, the patient was fitted with a medium Respironics Amara full face mask, and CPAP titration was initiated.  At a CPAP pressure of 12 cm of water, the patient was found to have fairly good control of his obstructive events, however, all attempts to increase the patient's pressure beyond this level only resulted in pressure induced central apneas.  OXYGEN DATA:  The patient had O2 desaturation as low as 80%.  CARDIAC DATA:  Rare PAC noted, but no clinically significant arrhythmias were seen.  MOVEMENTS-PARASOMNIA:  The patient had no significant leg jerks or other abnormal behaviors noted.  IMPRESSION-RECOMMENDATION: 1. Split night study reveals moderate obstructive sleep apnea/hypopnea     syndrome, with an AHI of 23 events  per hour during the diagnostic     portion of the study, and O2 desaturation as low as 80%.  The     patient was then fitted with a medium Respironics Amara full face     mask, and found to have an optimal CPAP pressure of 12     cm of water.  The patient should also be encouraged to work     aggressively on weight loss. 2. Rare PAC noted, but no clinically significant arrhythmias were     seen.     Barbaraann Share, MD,FCCP Diplomate, American Board of Sleep Medicine    [facility]/MEDQ  D:  07/23/2011 15:51:30  T:  07/24/2011 01:31:24  Job:  [id number]

## 2011-07-25 ENCOUNTER — Encounter: Payer: Self-pay | Admitting: Family Medicine

## 2011-07-25 NOTE — Telephone Encounter (Signed)
Patient advised as instructed via telephone, I called [facility] and was advised that since patient has had a sleep study then he can be set up with [facility] because that is who they use.

## 2011-07-25 NOTE — Telephone Encounter (Signed)
Please notify pt that his sleep study confirmed obstructive sleep apnea and he needs CPAP set at 12 cm H2O. Will you call [facility] and see if they can set him up with this, or will we have to get an outside agency to get this??-thx

## 2011-07-25 NOTE — Telephone Encounter (Signed)
Pls set this up with [facility].--thx

## 2011-07-25 NOTE — Telephone Encounter (Signed)
Pt report is ready under Notes tabs.  Please advise followup.

## 2011-07-26 NOTE — Telephone Encounter (Signed)
Referral entered.  Melissa Stenson notified.

## 2011-08-01 ENCOUNTER — Other Ambulatory Visit: Payer: Self-pay | Admitting: Family Medicine

## 2011-08-02 MED ORDER — LEVOTHYROXINE SODIUM 137 MCG PO TABS: 137.0000 ug | ORAL_TABLET | Freq: Every day | ORAL | Status: DC

## 2011-08-02 NOTE — Telephone Encounter (Signed)
Refill request for Levothyroxine #100 to [facility] Last seen-04/21/11 Follow up -08/24/11 RX sent.

## 2011-08-24 ENCOUNTER — Ambulatory Visit: Payer: PRIVATE HEALTH INSURANCE | Admitting: Family Medicine

## 2011-08-24 ENCOUNTER — Telehealth: Payer: Self-pay | Admitting: *Deleted

## 2011-08-24 NOTE — Telephone Encounter (Signed)
Pt has follow up appt on 9/4.  He wants to know if he needs to bring his CPAP machine with him.  I advised him he does not need to bring machine.  Pt wants to know when you want to see usage information stored on chip in machine.  Pt has been using machine for 3-4 weeks.  Machine is through [facility].  Advised pt I would call [facility] to see how we would access info from machine.  Please advise when you want to see download info.

## 2011-08-29 ENCOUNTER — Telehealth: Payer: Self-pay | Admitting: Family Medicine

## 2011-08-29 DIAGNOSIS — G459 Transient cerebral ischemic attack, unspecified: Secondary | ICD-10-CM

## 2011-08-29 NOTE — Telephone Encounter (Signed)
I'd like to see the download info now please.-thx

## 2011-08-29 NOTE — Telephone Encounter (Signed)
Caller: [name]/Patient.  PCP: McGown,Philiip; Call regarding and episode of  neurolocial and vision changes.  Onset 08/28/11.  While pt was watching TV  he had an sudden onset of head pressure, a change of vision described as a blind spot in the center of his vision, and the inability to think or remember.  Urgent symptoms positive per  'Had recent episode (within the last 24 hrs) of confusion, disorientation,or change in behaviour and NOW RESOLVED'.  See ED immediately.  Instructed pt to go to the ED.  Pt says he is close to [facility] and he will head that way.

## 2011-09-04 DIAGNOSIS — I6521 Occlusion and stenosis of right carotid artery: Secondary | ICD-10-CM

## 2011-09-04 HISTORY — DX: Occlusion and stenosis of right carotid artery: I65.21

## 2011-09-07 ENCOUNTER — Ambulatory Visit: Payer: PRIVATE HEALTH INSURANCE | Admitting: Family Medicine

## 2011-09-12 NOTE — Telephone Encounter (Signed)
CPAP usage report received today and is on your desk.  Pt appt is on 09/28/11.

## 2011-09-13 ENCOUNTER — Encounter: Payer: Self-pay | Admitting: Family Medicine

## 2011-09-13 NOTE — Telephone Encounter (Signed)
Notified of CPAP report and pt is pleased.   Pt states he did not go to ER on 08/28/11.  Someone told him that if he was having mini-strokes they would not show up on any tests that were done in the ER.  Pt has not had any of those symptoms since that time. Pt had a previous episode 2 months ago at work.  Pt had nausea, visual disturbances and could not remember a person if told their name. Pt has started a regimen of 81 mg ASA and Fish Oil as a preventative.  Pt has decided if he has any further symptoms he will go to the ER immediately.

## 2011-09-13 NOTE — Telephone Encounter (Signed)
Ok.  Tell him I want him to get carotid dopplers and a transthoracic echo for further w/u of these symptoms. If he agrees, I would like to order these for him ASAP (before his next f/u visit with me). DEFINITELY keep scheduled appt with me later this month.  -thx

## 2011-09-13 NOTE — Telephone Encounter (Signed)
Pls call pt and see let him know that his CPAP compliance report was excellent. Also, see what came of these neurologic symptoms and if he did go to [facility] then please request records.-thx

## 2011-09-14 ENCOUNTER — Telehealth: Payer: Self-pay | Admitting: Family Medicine

## 2011-09-14 NOTE — Telephone Encounter (Signed)
Names of test documented by Dr. Milinda Cave in 08/29/11 phone note.  I will call Mr. Purohit with test names.

## 2011-09-14 NOTE — Telephone Encounter (Signed)
Caller: [name]/Patient; Patient Name: [name]; PCP: Earley Favor [facility]); Best Callback Phone Number: [phone].  Patient states Stephanine from office calle him today regarding further testing that needs to be done before his next appointment with Dr. Milinda Cave.  He does not know the names of the tests and wants to inform his wife. no notes found regarding this in Epic. Note to office for follow up per PCP Calls, No Triage protocol.

## 2011-09-14 NOTE — Telephone Encounter (Signed)
Pt is notified of recommendations and is agreeable with both of these tests.  Pt states he has had "chest [place]" about 5 years ago at [facility].  Pt states workup started after a spot on lung was seen on CXR while he had pneumonia.  During CT follow up of that spot a questionable calcification of the arteries was seen.  Tests were ordered through Dr. Cyndia Diver, and the pulmonary dept. Please enter orders.  Pt best days are 17th, 18, 23, 24.  Please schedule both tests on same day.

## 2011-09-21 ENCOUNTER — Ambulatory Visit (HOSPITAL_COMMUNITY)
Admission: RE | Admit: 2011-09-21 | Discharge: 2011-09-21 | Disposition: A | Discharge status: Home / Self Care | Payer: PRIVATE HEALTH INSURANCE | Source: Ambulatory Visit | Attending: Family Medicine | Admitting: Family Medicine

## 2011-09-21 DIAGNOSIS — G459 Transient cerebral ischemic attack, unspecified: Secondary | ICD-10-CM

## 2011-09-21 DIAGNOSIS — I359 Nonrheumatic aortic valve disorder, unspecified: Secondary | ICD-10-CM

## 2011-09-21 DIAGNOSIS — I6529 Occlusion and stenosis of unspecified carotid artery: Secondary | ICD-10-CM | POA: Insufficient documentation

## 2011-09-21 NOTE — Progress Notes (Signed)
*  PRELIMINARY RESULTS* Vascular Ultrasound Carotid Duplex (Doppler) has been completed.  Preliminary findings: Right= 60-79% ICA stenosis. Left= 40-59% ICA stenosis. Bilateral antegrade vertebral flow.  Farrel Demark, RDMS, RVT  09/21/2011, 2:36 PM

## 2011-09-28 ENCOUNTER — Encounter: Payer: Self-pay | Admitting: Family Medicine

## 2011-09-28 ENCOUNTER — Ambulatory Visit (INDEPENDENT_AMBULATORY_CARE_PROVIDER_SITE_OTHER): Payer: PRIVATE HEALTH INSURANCE | Admitting: Family Medicine

## 2011-09-28 VITALS — BP 138/75 | HR 80 | Wt 220.0 lb

## 2011-09-28 DIAGNOSIS — I350 Nonrheumatic aortic (valve) stenosis: Secondary | ICD-10-CM

## 2011-09-28 DIAGNOSIS — R29818 Other symptoms and signs involving the nervous system: Secondary | ICD-10-CM

## 2011-09-28 DIAGNOSIS — E1142 Type 2 diabetes mellitus with diabetic polyneuropathy: Secondary | ICD-10-CM

## 2011-09-28 DIAGNOSIS — E878 Other disorders of electrolyte and fluid balance, not elsewhere classified: Secondary | ICD-10-CM | POA: Insufficient documentation

## 2011-09-28 DIAGNOSIS — E1049 Type 1 diabetes mellitus with other diabetic neurological complication: Secondary | ICD-10-CM

## 2011-09-28 DIAGNOSIS — I6521 Occlusion and stenosis of right carotid artery: Secondary | ICD-10-CM | POA: Insufficient documentation

## 2011-09-28 DIAGNOSIS — I359 Nonrheumatic aortic valve disorder, unspecified: Secondary | ICD-10-CM

## 2011-09-28 DIAGNOSIS — R2990 Unspecified symptoms and signs involving the nervous system: Secondary | ICD-10-CM

## 2011-09-28 DIAGNOSIS — E039 Hypothyroidism, unspecified: Secondary | ICD-10-CM

## 2011-09-28 DIAGNOSIS — I872 Venous insufficiency (chronic) (peripheral): Secondary | ICD-10-CM

## 2011-09-28 DIAGNOSIS — I739 Peripheral vascular disease, unspecified: Secondary | ICD-10-CM | POA: Insufficient documentation

## 2011-09-28 DIAGNOSIS — I6529 Occlusion and stenosis of unspecified carotid artery: Secondary | ICD-10-CM

## 2011-09-28 MED ORDER — FUROSEMIDE 20 MG PO TABS: ORAL_TABLET | ORAL | Status: DC

## 2011-09-28 NOTE — Assessment & Plan Note (Signed)
I will refer to cardiologist for further evaluation and management of this condition.

## 2011-09-28 NOTE — Assessment & Plan Note (Signed)
Two brief episodes, very vague.  This sounds more like hypoglycemic rxn than TIA.  Migraine aura w/out HA is possible but this would be a very late age for onset of this disorder.  I don't see any need for neurologic imaging at this point.  Given his carotid doppler findings of about 70% stenosis on the right, it does make TIA a distinct possibility and I think it would be prudent to get a cardiologist's opinion on this problem.  In the meantime, he'll continue ASA daily.

## 2011-09-28 NOTE — Patient Instructions (Signed)
Eat low sodium diet. Wear knee high compression hose. Elevated legs above the level of the heart whenever possible. Take a fluid pill when your swelling is at it's worst and none of the other measures are helping (1-3 days in a row of fluid pill should be fine)

## 2011-09-28 NOTE — Assessment & Plan Note (Signed)
Discussed low Na diet, compression stockings, elevation of legs, and prn lasix use when things get the most severe. If he has to use lasix fairly regularly then I told him to arrange appt with our lab for a BMET.

## 2011-09-28 NOTE — Progress Notes (Signed)
OFFICE NOTE  09/28/2011  CC:  Chief Complaint  Patient presents with  . Follow-up    DM, hyperlipidemia     HPI: Patient is a 61 y.o. Caucasian male who is here for 74mo fu/ DM 1 and hyperlipidemia and recent vague neurologic sx's.  He went to his endocrinologist yesterday and got labs there, A1c a bit better at 8.6%, some other labs,too, sounds like CMET.  Apparently no titration of insulin pump was done yet b/c of some monitoring issues that made it unclear when he was having his highs.  They are in the process of straightening this out and he has further consult with dietitian again.  Has had some neurologic dysfunction lately: 1st episode: About 2 mo ago, he had onset of vision difficulty--"split image", no headache, felt nauseous and vomited once.  Some cognitive impairment that impaired his ability to talk---but he could physically talk fine.  He went home and slept x 1 hour and then woke up feeling normal.  BS unknown.  No focal sx's to indicate a stroke.  Second episode: Then about 1 mo later he had more vision changes, "brighter than normal", "wasn't feeling quite right", he went home and had the same memory problem/cognitive impairment like before.  He had no focal sx's to indicate a stroke.  No HA.  No nausea/vomiting.  BS was 140.  No headaches during or in between these episodes.  Due to these sx's reported to me by phone, I ordered carotid dopplers and a cardiac echo.  The dopplers showed about 70% stenosis on the right, less on the left. His echo showed LVH, diastolic dysfunction, normal EF, mild/mod aortic stenosis.  Of note, he does use his CPAP now and this seems to be going ok.  He has much less daytime somnolence.  C/o LE swelling for the last year at least, bothers him b/c legs/ankles/feet feel swollen.  He feels like right leg is a bit worse than left.  No pain or acute changes in this.    Pertinent PMH:  DM1 Hyperlipidemia Hypothyroidism Fatigue Diabetic  peripheral neuropathy OSA-uses CPAP  MEDS:  Outpatient Prescriptions Prior to Visit  Medication Sig Dispense Refill  . atorvastatin (LIPITOR) 80 MG tablet Take 40 mg by mouth daily.       . insulin aspart (NOVOLOG) 100 UNIT/ML injection As directed by continuous infusion (non-IV) route.  2 vial  3  . levothyroxine (SYNTHROID, LEVOTHROID) 137 MCG tablet Take 1 tablet (137 mcg total) by mouth daily.  100 tablet  0  . loperamide (IMODIUM) 2 MG capsule Take 2 mg by mouth as needed.      . tadalafil (CIALIS) 20 MG tablet Take 20 mg by mouth daily as needed.      [name] albuterol (VENTOLIN HFA) 108 (90 BASE) MCG/ACT inhaler Inhale 2 puffs into the lungs every 6 (six) hours as needed for wheezing.  1 Inhaler  1  . mometasone-formoterol (DULERA) 100-5 MCG/ACT AERO Inhale 2 puffs into the lungs 2 (two) times daily.      ASA 81mg  qd  PE: Blood pressure 138/75, pulse 80, weight 220 lb (99.791 kg). Gen: Alert, well appearing.  Patient is oriented to person, place, time, and situation. NECK: right carotid with faint bruit vs transmitted cardiac murmur.  Left carotid without bruit. CV: RRR, 2/6 systolic murmur heard best at base, no diastolic murmur. LUNGS: CTA bilat, nonlabored resps EXT: 2-3+ pitting edema bilat, without much stasis dermatitis to speak of.  IMPRESSION AND PLAN:  Neurological dysfunction Two brief episodes, very vague.  This sounds more like hypoglycemic rxn than TIA.  Migraine aura w/out HA is possible but this would be a very late age for onset of this disorder.  I don't see any need for neurologic imaging at this point.  Given his carotid doppler findings of about 70% stenosis on the right, it does make TIA a distinct possibility and I think it would be prudent to get a cardiologist's opinion on this problem.  In the meantime, he'll continue ASA daily.    Aortic stenosis, moderate I will refer to cardiologist for further evaluation and management of this condition.  Chronic venous  insufficiency Discussed low Na diet, compression stockings, elevation of legs, and prn lasix use when things get the most severe. If he has to use lasix fairly regularly then I told him to arrange appt with our lab for a BMET.  DM type 1 causing neurological disease, not at goal He is getting managed [place] He seems to be fairly unconcerned about his poor control.   He will continue f/u with endocrinology.  They got labs recently and he said they would send them to me.    Hypothyroidism His endo MD did labs yesterday and pt is unsure if TSH was done. Endo is supposed to send these to me.  If TSH was not done then he'll have to return for lab visit for TSH.     FOLLOW UP: 3 months

## 2011-09-28 NOTE — Assessment & Plan Note (Signed)
His endo MD did labs yesterday and pt is unsure if TSH was done. Endo is supposed to send these to me.  If TSH was not done then he'll have to return for lab visit for TSH.

## 2011-09-28 NOTE — Assessment & Plan Note (Signed)
He is getting managed by [place]ocrinologist [place]. He seems to be fairly unconcerned about his poor control.   He will continue f/u with endocrinology.  They got labs recently and he said they would send them to me.

## 2011-10-04 DIAGNOSIS — I2510 Atherosclerotic heart disease of native coronary artery without angina pectoris: Secondary | ICD-10-CM

## 2011-10-04 HISTORY — DX: Atherosclerotic heart disease of native coronary artery without angina pectoris: I25.10

## 2011-10-04 HISTORY — PX: CARDIAC CATHETERIZATION: SHX172

## 2011-10-14 DIAGNOSIS — I872 Venous insufficiency (chronic) (peripheral): Secondary | ICD-10-CM

## 2011-10-14 HISTORY — DX: Venous insufficiency (chronic) (peripheral): I87.2

## 2011-10-19 ENCOUNTER — Encounter: Payer: Self-pay | Admitting: Family Medicine

## 2011-10-27 ENCOUNTER — Other Ambulatory Visit: Payer: Self-pay | Admitting: Cardiovascular Disease

## 2011-10-27 ENCOUNTER — Ambulatory Visit
Admission: RE | Admit: 2011-10-27 | Discharge: 2011-10-27 | Disposition: A | Discharge status: Home / Self Care | Payer: PRIVATE HEALTH INSURANCE | Source: Ambulatory Visit | Attending: Cardiovascular Disease | Admitting: Cardiovascular Disease

## 2011-10-27 ENCOUNTER — Encounter (HOSPITAL_COMMUNITY): Payer: Self-pay | Admitting: Pharmacy Technician

## 2011-10-27 DIAGNOSIS — Z01811 Encounter for preprocedural respiratory examination: Secondary | ICD-10-CM

## 2011-10-27 DIAGNOSIS — R9439 Abnormal result of other cardiovascular function study: Secondary | ICD-10-CM

## 2011-10-27 HISTORY — DX: Abnormal result of other cardiovascular function study: R94.39

## 2011-10-28 ENCOUNTER — Ambulatory Visit (HOSPITAL_COMMUNITY)
Admission: RE | Admit: 2011-10-28 | Discharge: 2011-10-28 | Disposition: A | Discharge status: Home / Self Care | Payer: PRIVATE HEALTH INSURANCE | Source: Ambulatory Visit | Attending: Cardiovascular Disease | Admitting: Cardiovascular Disease

## 2011-10-28 ENCOUNTER — Encounter (HOSPITAL_COMMUNITY)
Admission: RE | Disposition: A | Discharge status: Home / Self Care | Payer: Self-pay | Source: Ambulatory Visit | Attending: Cardiovascular Disease

## 2011-10-28 DIAGNOSIS — Z794 Long term (current) use of insulin: Secondary | ICD-10-CM | POA: Insufficient documentation

## 2011-10-28 DIAGNOSIS — I350 Nonrheumatic aortic (valve) stenosis: Secondary | ICD-10-CM | POA: Diagnosis present

## 2011-10-28 DIAGNOSIS — E109 Type 1 diabetes mellitus without complications: Secondary | ICD-10-CM | POA: Insufficient documentation

## 2011-10-28 DIAGNOSIS — E1049 Type 1 diabetes mellitus with other diabetic neurological complication: Secondary | ICD-10-CM | POA: Diagnosis present

## 2011-10-28 DIAGNOSIS — E785 Hyperlipidemia, unspecified: Secondary | ICD-10-CM | POA: Diagnosis present

## 2011-10-28 DIAGNOSIS — I2510 Atherosclerotic heart disease of native coronary artery without angina pectoris: Secondary | ICD-10-CM | POA: Insufficient documentation

## 2011-10-28 DIAGNOSIS — R931 Abnormal findings on diagnostic imaging of heart and coronary circulation: Secondary | ICD-10-CM | POA: Diagnosis present

## 2011-10-28 DIAGNOSIS — I359 Nonrheumatic aortic valve disorder, unspecified: Secondary | ICD-10-CM | POA: Insufficient documentation

## 2011-10-28 DIAGNOSIS — E039 Hypothyroidism, unspecified: Secondary | ICD-10-CM | POA: Diagnosis present

## 2011-10-28 HISTORY — PX: LEFT AND RIGHT HEART CATHETERIZATION WITH CORONARY ANGIOGRAM: SHX5449

## 2011-10-28 LAB — GLUCOSE, CAPILLARY: Glucose-Capillary: 199 mg/dL — ABNORMAL HIGH (ref 70–99)

## 2011-10-28 LAB — POCT I-STAT 3, ART BLOOD GAS (G3+)
Acid-Base Excess: 1 mmol/L (ref 0.0–2.0)
Bicarbonate: 26.1 mEq/L — ABNORMAL HIGH (ref 20.0–24.0)
O2 Saturation: 95 %
pCO2 arterial: 42.5 mmHg (ref 35.0–45.0)
pO2, Arterial: 76 mmHg — ABNORMAL LOW (ref 80.0–100.0)

## 2011-10-28 LAB — POCT I-STAT 3, VENOUS BLOOD GAS (G3P V)
Bicarbonate: 24.8 mEq/L — ABNORMAL HIGH (ref 20.0–24.0)
TCO2: 26 mmol/L (ref 0–100)
pCO2, Ven: 47.9 mmHg (ref 45.0–50.0)
pH, Ven: 7.321 — ABNORMAL HIGH (ref 7.250–7.300)
pO2, Ven: 39 mmHg (ref 30.0–45.0)

## 2011-10-28 SURGERY — LEFT AND RIGHT HEART CATHETERIZATION WITH CORONARY ANGIOGRAM
Anesthesia: LOCAL

## 2011-10-28 MED ORDER — FENTANYL CITRATE 0.05 MG/ML IJ SOLN
INTRAMUSCULAR | Status: AC
  Filled 2011-10-28: qty 2

## 2011-10-28 MED ORDER — DIAZEPAM 5 MG PO TABS
5.0000 mg | ORAL_TABLET | ORAL | Status: AC
  Administered 2011-10-28: 5 mg via ORAL
  Filled 2011-10-28: qty 1

## 2011-10-28 MED ORDER — SODIUM CHLORIDE 0.9 % IV SOLN: INTRAVENOUS | Status: DC

## 2011-10-28 MED ORDER — MIDAZOLAM HCL 2 MG/2ML IJ SOLN
INTRAMUSCULAR | Status: AC
  Filled 2011-10-28: qty 2

## 2011-10-28 MED ORDER — SODIUM CHLORIDE 0.9 % IJ SOLN
3.0000 mL | Freq: Two times a day (BID) | INTRAMUSCULAR | Status: DC
Start: 2011-10-28 — End: 2011-10-28

## 2011-10-28 MED ORDER — ACETAMINOPHEN 325 MG PO TABS: 650.0000 mg | ORAL_TABLET | ORAL | Status: DC | PRN

## 2011-10-28 MED ORDER — NITROGLYCERIN 0.2 MG/ML ON CALL CATH LAB
INTRAVENOUS | Status: AC
  Filled 2011-10-28: qty 1

## 2011-10-28 MED ORDER — METOPROLOL TARTRATE 25 MG PO TABS: 25.0000 mg | ORAL_TABLET | Freq: Two times a day (BID) | ORAL | Status: DC

## 2011-10-28 MED ORDER — HEPARIN (PORCINE) IN NACL 2-0.9 UNIT/ML-% IJ SOLN
INTRAMUSCULAR | Status: AC
  Filled 2011-10-28: qty 1000

## 2011-10-28 MED ORDER — ASPIRIN EC 81 MG PO TBEC: 81.0000 mg | DELAYED_RELEASE_TABLET | Freq: Every day | ORAL | Status: DC

## 2011-10-28 MED ORDER — RANOLAZINE ER 500 MG PO TB12: 500.0000 mg | ORAL_TABLET | Freq: Two times a day (BID) | ORAL | Status: DC

## 2011-10-28 MED ORDER — ASPIRIN 81 MG PO CHEW
324.0000 mg | CHEWABLE_TABLET | ORAL | Status: AC
  Administered 2011-10-28: 324 mg via ORAL
  Filled 2011-10-28: qty 4

## 2011-10-28 MED ORDER — ONDANSETRON HCL 4 MG/2ML IJ SOLN: 4.0000 mg | Freq: Four times a day (QID) | INTRAMUSCULAR | Status: DC | PRN

## 2011-10-28 MED ORDER — LIDOCAINE HCL (PF) 1 % IJ SOLN
INTRAMUSCULAR | Status: AC
  Filled 2011-10-28: qty 30

## 2011-10-28 MED ORDER — SODIUM CHLORIDE 0.9 % IJ SOLN: 3.0000 mL | INTRAMUSCULAR | Status: DC | PRN

## 2011-10-28 MED ORDER — SODIUM CHLORIDE 0.9 % IV SOLN
INTRAVENOUS | Status: DC
  Administered 2011-10-28: 09:00:00 via INTRAVENOUS

## 2011-10-28 MED ORDER — SODIUM CHLORIDE 0.9 % IV SOLN: 250.0000 mL | INTRAVENOUS | Status: DC | PRN

## 2011-10-28 NOTE — H&P (Signed)
  Updated H&P: See complete office note from me yesterday 10/27/2011 and the initial note from Br. Berry on 10/12/2011. Patient presents for R and L heart cath after high risk nuclear stress test performed yesterday. No change in PEx. Labs reviewed. Discussed potential options with patient and family who understand risks/bemnefits of procedure. Jacquelynne Guedes A 10/28/2011 10:52 AM

## 2011-10-30 NOTE — CV Procedure (Signed)
Right and Left Heart Catheterization 10/28/2011  [name], 61 y.o., male  Full note dictated;  Se  Diagram in chart  DICTATION # [id number], [id number]  Mild AS with p-p gradient of 10 mm Hg and AVA 1.9 to 2.0 cm2  Calcified coronary arteries LM: superior calcification without significant luminal obstruction LAD: 20 - 30% ostial/prox, 70% mid LCX; diffuse 70 and 60% OM stenosis RCA: 50% mid, 50 - 60% before crux, 70% distal and 95% Ant RV branch with L to R collateral from LAD  Nl LV Fxn;  EF 60% No AR  Rec: initial medical therapy.   Lennette Bihari, MD, [facility] 10/30/2011 4:00 PM

## 2011-10-31 ENCOUNTER — Encounter: Payer: Self-pay | Admitting: Family Medicine

## 2011-10-31 NOTE — Cardiovascular Report (Signed)
NAME:  [name], [name] NO.:  [id number]  MEDICAL RECORD NO.:  [id number]  LOCATION:  [facility]                         FACILITY:  [facility]  PHYSICIAN:  Nicki Guadalajara, M.D.     DATE OF BIRTH:  12/18/1950  DATE OF PROCEDURE:  10/28/2011 DATE OF DISCHARGE:  10/28/2011                           CARDIAC CATHETERIZATION   PROCEDURE:  Right and left heart catheterization; Swan-Ganz catheterization; cardiac output determination; cine coronary angiography; left ventriculography, and central aortography.  [name] is a 61 year old gentleman who has a 50-year history of type 1 diabetes mellitus, currently on an insulin pump for approximately 10 years.  Recently, the patient has noticed shortness of breath and some vague symptoms.  He had initially been referred to Dr. [name] on October 12, 2011.  Subsequent echo Doppler study did show normal LV function, but raised the possibility of moderate aortic stenosis. The patient underwent a nuclear perfusion study in our office on October 27, 2011, which was high risk and early positive.  The patient did not develop any chest pain.  However, he did develop up to 4 mm of horizontal to downsloping ST-segment depression, which persisted for approximately 18 minutes into the recovery period.  Scintigraphic images suggested inferolateral ischemia.  I saw the patient in followup of his abnormal nuclear scan that same day, and scheduled him for right and left heart catheterization today.  Please refer to my dictated H and P from the office on October 27, 2011.  PROCEDURE:  After premedication with Versed 2 mg plus fentanyl 50 mcg, the patient was prepped and draped in usual fashion.  His right femoral artery and right femoral vein were punctured and a 6-French arterial sheath and 7-French venous sheath were inserted.  Initial attention was directed at the coronary arteries to make certain the patient did not have high-grade left main  equivalent disease in light of his early positive and profoundly abnormal stress test.  An FL4, 5-French catheter was used for selective angiography of the left coronary system.  A right FR4 catheter was used for selective angiography in the right coronary artery.  Swan-Ganz catheterization was then performed with hemodynamic recording in the RA, RV, PA, and PC positions.  Cardiac output by the thermodilution and Fick methods were obtained.  Pigtail catheter was inserted into the central aorta.  Using a straight wire, the aortic valve was able to be crossed and the pigtail was placed into the left ventricle.  Simultaneous left ventricular and pulmonary capillary wedge pressures were recorded.  Left ventriculography was then performed.  The catheter was then pulled back into the aorta for LVAO pressure recording.  Central aortography was performed to assess valve excursion and aortic insufficiency.  Hemostasis was obtained by direct manual pressure.  HEMODYNAMIC DATA:  RA pressure A-wave 13, V-wave 11, mean 8, RV pressure 42/8, PA pressure 42/13, mean pulmonary capillary wedge pressure 16.  On LV AO pullback, left ventricular pressure was 165/16, central aortic pressure 155/79 giving a peak pressure of 10 mm.  Initially, simultaneous LV and FA pressures were 158 and 141, giving a 17 mm initial gradient.  There was not found to be any difference in the FA AO  pressures once the LV catheter was pulled back into the AO, simultaneous AO FA pressures were recorded.  Cardiac output by thermodilution was 5.4, by the Fick method was 5.7 L/minute.  Cardiac index was 2.4 and 2.5 L/minute per metered square. Aortic valve estimated valve area was 1.9 to 2.0 cm2 concordant with only mild aortic stenosis.  ANGIOGRAPHIC DATA:  There was significant coronary calcification with calcification involving the superior aspect of the left main, calcification of the proximal LAD, circumflex, and right  coronary arteries.  The left main coronary artery did have a rim of superficial calcification, but did not have obstructive disease and bifurcated into an LAD and left circumflex system.  The LAD had 20% to 30% smooth area of proximal narrowing ostially to proximally.  There was 70% focal mid LAD stenosis.  The circumflex vessel had 20% ostial stenosis.  The circumflex marginal vessel had diffuse 70% stenosis before its bifurcation.  It was 60% narrowed diffusely for short duration after this branch bifurcation. The AV groove circumflex is free of significant disease.  The right coronary artery was calcified.  There was 50% stenosis proximal to the anterior RV marginal branch, 50% to 60% stenosis between the anterior RV marginal branch and acute margin with 70% stenosis in the distal RCA prior to the PDA takeoff.  The anterior marginal vessel had 95% stenosis and did appear to possibly have a flush and fill phenomena due to collateralization from the LAD system to this region.  Left ventriculography revealed an ejection fraction of 60%.  There was no focal segmental wall motion abnormalities.  Central aortography did not demonstrate any aortic insufficiency.  There was only very mild reduction of aortic valve excursion.  IMPRESSION: 1. Mild aortic valve stenosis and a trileaflet aortic valve with a     peak to peak gradient of approximately 10 mm and a calculated     aortic valve area of 1.9 to 2.0 cm2. 2. Significant coronary calcification involving all coronary arteries     with 20% to 30% ostial proximal left anterior descending narrowing,     70% mid left anterior descending narrowing, 20% ostial circumflex     stenosis, diffuse 70% circumflex marginal stenosis, and 50% to 60%     mid distal, and 70% distal RCA stenosis with 95% stenosis in the     anterior RV marginal branch with left-to-right collaterals.  RECOMMENDATION:  The patient will be started on medical therapy.   He was noted to have inferolateral ischemia on his nuclear scan, which is due to abnormalities in his RCA territory.  At present, his aortic stenosis is only mild.  Close followup will be done in the office on increased medical therapy.          ______________________________ Nicki Guadalajara, M.D.     TK/MEDQ  D:  10/30/2011  T:  10/31/2011  Job:  [id number]  cc:   [name], MD Nanetta Batty, M.D.

## 2011-11-01 ENCOUNTER — Encounter: Payer: Self-pay | Admitting: Family Medicine

## 2011-12-20 DIAGNOSIS — Z9889 Other specified postprocedural states: Secondary | ICD-10-CM | POA: Insufficient documentation

## 2011-12-26 ENCOUNTER — Ambulatory Visit: Payer: PRIVATE HEALTH INSURANCE | Admitting: Family Medicine

## 2012-01-06 ENCOUNTER — Encounter: Payer: Self-pay | Admitting: Family Medicine

## 2012-01-06 ENCOUNTER — Ambulatory Visit (INDEPENDENT_AMBULATORY_CARE_PROVIDER_SITE_OTHER): Payer: PRIVATE HEALTH INSURANCE | Admitting: Family Medicine

## 2012-01-06 VITALS — BP 151/77 | HR 85 | Ht 74.0 in | Wt 217.0 lb

## 2012-01-06 DIAGNOSIS — E785 Hyperlipidemia, unspecified: Secondary | ICD-10-CM

## 2012-01-06 DIAGNOSIS — I872 Venous insufficiency (chronic) (peripheral): Secondary | ICD-10-CM

## 2012-01-06 DIAGNOSIS — I2510 Atherosclerotic heart disease of native coronary artery without angina pectoris: Secondary | ICD-10-CM

## 2012-01-06 DIAGNOSIS — E039 Hypothyroidism, unspecified: Secondary | ICD-10-CM

## 2012-01-06 LAB — LIPID PANEL
LDL Cholesterol: 122 mg/dL — ABNORMAL HIGH (ref 0–99)
Total CHOL/HDL Ratio: 3
Triglycerides: 65 mg/dL (ref 0.0–149.0)

## 2012-01-06 LAB — COMPREHENSIVE METABOLIC PANEL
ALT: 30 U/L (ref 0–53)
AST: 23 U/L (ref 0–37)
CO2: 30 mEq/L (ref 19–32)
Calcium: 8.6 mg/dL (ref 8.4–10.5)
Chloride: 101 mEq/L (ref 96–112)
Creatinine, Ser: 0.9 mg/dL (ref 0.4–1.5)
Sodium: 137 mEq/L (ref 135–145)
Total Bilirubin: 1.1 mg/dL (ref 0.3–1.2)
Total Protein: 7.2 g/dL (ref 6.0–8.3)

## 2012-01-06 NOTE — Progress Notes (Signed)
OFFICE NOTE  01/06/2012  CC:  Chief Complaint  Patient presents with  . Follow-up    cardiac issues, venous insuff     HPI: Patient is a 62 y.o. Caucasian male who is here for 3 mo f/u LE venous insufficiency edema, vascular disease, hx of TIAs, and hyperlipidemia. He has been wearing compression hose and this has helped his edema significantly---he has not needed lasix at all. He has had some trouble with some of the meds that cardiology tried to get him to take (?renexa and metoprolol) and getting him off of these meds have helped (mood, sugars up). His PAD eval did not show any critical stenosis. His venous studies confirmed insufficiency.  Pertinent PMH:  Past Medical History  Diagnosis Date  . Diabetes mellitus Dx'd age 83    Novolog via insulin pump; sub-optimal control x years  . High cholesterol     Takes 1/2 of 80mg  atorvastatin daily  . Hypothyroidism     Thyroidectomy 2002; multinodular goiter  . Erectile dysfunction     [place]rolog[place]  . Diabetic retinopathy(362.0)     Proliferative: Hx of retinal detachment on right-light perception only in right eye  . History of tobacco abuse     Quit about 1990 (has 35 pack-yr hx)  . Heart murmur, systolic   . OSA (obstructive sleep apnea)     06/2011 sleep study: moderate OSA, CPAP at 12 cm H2O recommended.  . Impaired vision     right eye light perception only; hx of retinal detachment.  . Recurrent pneumonia   . GERD (gastroesophageal reflux disease)   . Venous insufficiency   . Diastolic dysfunction 2007    TEE with diastolic dysfunction, EF 74%.  . Diabetic neuropathy   . Aortic stenosis, mild Sept/Oct /2013    Mild/mod by echo; mild by cath 10/28/11.  . Carotid stenosis, right 09/2011    50-70% stenosis  . 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  Med mgmt recommended.    MEDS:  Outpatient Prescriptions Prior  to Visit  Medication Sig Dispense Refill  . aspirin EC 81 MG tablet Take 81 mg by mouth daily.      . Insulin Human (INSULIN PUMP) 100 unit/ml SOLN Inject 1 each into the skin continuous. Novolog in Pump      . levothyroxine (SYNTHROID, LEVOTHROID) 137 MCG tablet Take 1 tablet (137 mcg total) by mouth daily.  100 tablet  0  . Omega-3 Fatty Acids (FISH OIL) 500 MG CAPS Take 1 capsule by mouth daily.      . tadalafil (CIALIS) 20 MG tablet Take 20 mg by mouth daily as needed. For sexual activity      . [DISCONTINUED] rosuvastatin (CRESTOR) 5 MG tablet Take 5 mg by mouth every other day.      Last reviewed on 01/06/2012  8:16 AM by [name], MD  PE: Blood pressure 151/77, pulse 85, height 6\' 2"  (1.88 m), weight 217 lb (98.431 kg). Gen: Alert, well appearing.  Patient is oriented to person, place, time, and situation. LEGS: 2+ LE pitting edema, with minimal stasis skin changes.  IMPRESSION AND PLAN:  Hyperlipidemia Goal LDL <70.  Statin intolerance hx may make this unattainable.  Tolerating crestor 10 mg qd ok. Check FLP today.  Hypothyroidism He is unsure if his endocrinologist is checking this and he asks for TSH check today.  Chronic venous  insufficiency Improved. Continue compression hose.  Emphasized importance of low Na diet again. Elevate legs prn.  CAD (coronary artery disease) Diffuse disease, was not amenable to intervention so medical management maximization is of utmost importance. At this point he is on crestor and ASA.  He could not tolerate metoprolol due to increased irritability and increased glucoses.    An After Visit Summary was printed and given to the patient.  FOLLOW UP: 4 mo

## 2012-01-10 ENCOUNTER — Other Ambulatory Visit: Payer: Self-pay | Admitting: Family Medicine

## 2012-01-10 MED ORDER — ROSUVASTATIN CALCIUM 20 MG PO TABS: 20.0000 mg | ORAL_TABLET | Freq: Every day | ORAL | Status: DC

## 2012-01-10 NOTE — Progress Notes (Signed)
Crestor RX sent per MD instruction in lab result note.

## 2012-01-11 DIAGNOSIS — I2510 Atherosclerotic heart disease of native coronary artery without angina pectoris: Secondary | ICD-10-CM | POA: Insufficient documentation

## 2012-01-11 NOTE — Assessment & Plan Note (Signed)
Improved. Continue compression hose.  Emphasized importance of low Na diet again. Elevate legs prn.

## 2012-01-11 NOTE — Assessment & Plan Note (Signed)
He is unsure if his endocrinologist is checking this and he asks for TSH check today.

## 2012-01-11 NOTE — Assessment & Plan Note (Signed)
Diffuse disease, was not amenable to intervention so medical management maximization is of utmost importance. At this point he is on crestor and ASA.  He could not tolerate metoprolol due to increased irritability and increased glucoses.

## 2012-01-11 NOTE — Assessment & Plan Note (Addendum)
Goal LDL <70.  Statin intolerance hx may make this unattainable.  Tolerating crestor 10 mg qd ok. Check FLP today.

## 2012-01-13 ENCOUNTER — Telehealth: Payer: Self-pay | Admitting: *Deleted

## 2012-01-13 NOTE — Telephone Encounter (Signed)
Pt called stating that Crestor 20 mg tabs are too expensive.  States he has taken Atorvastatin 80 mg in the past and tolerated.  If this would be an option he would like to have this called to pharmacy.

## 2012-01-16 MED ORDER — ATORVASTATIN CALCIUM 80 MG PO TABS: 80.0000 mg | ORAL_TABLET | Freq: Every day | ORAL | Status: DC

## 2012-01-16 NOTE — Telephone Encounter (Signed)
RX sent.  D/C'd Crestor

## 2012-01-16 NOTE — Telephone Encounter (Signed)
Sure. D/C crestor. Please eRx Atorvastatin 80mg  qd, #30, RF x 2.  Recheck lipids 88mo.

## 2012-01-19 ENCOUNTER — Encounter: Payer: Self-pay | Admitting: *Deleted

## 2012-01-20 ENCOUNTER — Encounter: Payer: Self-pay | Admitting: Family Medicine

## 2012-01-20 ENCOUNTER — Ambulatory Visit (INDEPENDENT_AMBULATORY_CARE_PROVIDER_SITE_OTHER): Payer: PRIVATE HEALTH INSURANCE | Admitting: Family Medicine

## 2012-01-20 VITALS — BP 168/84 | HR 91 | Ht 74.0 in | Wt 218.0 lb

## 2012-01-20 DIAGNOSIS — L304 Erythema intertrigo: Secondary | ICD-10-CM

## 2012-01-20 DIAGNOSIS — L538 Other specified erythematous conditions: Secondary | ICD-10-CM

## 2012-01-20 MED ORDER — CLOTRIMAZOLE-BETAMETHASONE 1-0.05 % EX CREA: TOPICAL_CREAM | CUTANEOUS | Status: DC

## 2012-01-20 NOTE — Assessment & Plan Note (Signed)
Left groin crease, resolving with aeration and avoidance of friction. Will give lotrisone cream to have on hand for future flares.  Warned about possibility of bacterial secondary infxn if he lets the area get too macerated.  Pt expressed understanding.

## 2012-01-20 NOTE — Progress Notes (Signed)
OFFICE NOTE  01/20/2012  CC:  Chief Complaint  Patient presents with  . Follow-up    needs to talk to doctor about something     HPI: Patient is a 62 y.o. Caucasian male who is here for rash in left groin crease that came up a week ago, felt irritated when he walked, he let the area air-out and began wearing boxer/briefs that gave more aeration to the region and the rash is 75% better.  He describes it feeling wet but not sure if it was actually macerated or just sweaty.  He applied nothing to the area.    Pertinent PMH:  DM, insulin-requiring.  MEDS:  Outpatient Prescriptions Prior to Visit  Medication Sig Dispense Refill  . aspirin EC 81 MG tablet Take 81 mg by mouth daily.      [name] atorvastatin (LIPITOR) 80 MG tablet Take 1 tablet (80 mg total) by mouth daily.  30 tablet  2  . Coenzyme Q10 (CO Q 10) 100 MG CAPS Take 1 capsule by mouth daily.      . Insulin Human (INSULIN PUMP) 100 unit/ml SOLN Inject 1 each into the skin continuous. Novolog in Pump      . levothyroxine (SYNTHROID, LEVOTHROID) 137 MCG tablet Take 1 tablet (137 mcg total) by mouth daily.  100 tablet  0  . Omega-3 Fatty Acids (FISH OIL) 500 MG CAPS Take 1 capsule by mouth daily.      . tadalafil (CIALIS) 20 MG tablet Take 20 mg by mouth daily as needed. For sexual activity      . lisinopril (PRINIVIL,ZESTRIL) 2.5 MG tablet Take 2.5 mg by mouth daily.      . nebivolol (BYSTOLIC) 5 MG tablet Take 5 mg by mouth daily.      Last reviewed on 01/20/2012  8:23 AM by [name], MD  PE: Blood pressure 168/84, pulse 91, height 6\' 2"  (1.88 m), weight 218 lb (98.884 kg), SpO2 98.00%. Gen: Alert, well appearing.  Patient is oriented to person, place, time, and situation. Groin: left groin crease with mild pinkish macular rash without maceration or vesicles or pustules.   Testicles/scrotum/penis w/out rash or lesion.  IMPRESSION AND PLAN:  Intertrigo Left groin crease, resolving with aeration and avoidance of  friction. Will give lotrisone cream to have on hand for future flares.  Warned about possibility of bacterial secondary infxn if he lets the area get too macerated.  Pt expressed understanding.  An After Visit Summary was printed and given to the patient.  FOLLOW UP: prn

## 2012-02-18 ENCOUNTER — Other Ambulatory Visit: Payer: Self-pay

## 2012-05-01 ENCOUNTER — Other Ambulatory Visit (HOSPITAL_COMMUNITY): Payer: Self-pay | Admitting: Cardiovascular Disease

## 2012-05-01 DIAGNOSIS — I350 Nonrheumatic aortic (valve) stenosis: Secondary | ICD-10-CM

## 2012-05-01 DIAGNOSIS — I739 Peripheral vascular disease, unspecified: Secondary | ICD-10-CM

## 2012-05-08 ENCOUNTER — Ambulatory Visit (INDEPENDENT_AMBULATORY_CARE_PROVIDER_SITE_OTHER): Payer: PRIVATE HEALTH INSURANCE | Admitting: Family Medicine

## 2012-05-08 ENCOUNTER — Encounter: Payer: Self-pay | Admitting: Family Medicine

## 2012-05-08 VITALS — BP 144/78 | HR 84 | Temp 97.9°F | Resp 16 | Wt 215.2 lb

## 2012-05-08 DIAGNOSIS — E878 Other disorders of electrolyte and fluid balance, not elsewhere classified: Secondary | ICD-10-CM

## 2012-05-08 DIAGNOSIS — E1049 Type 1 diabetes mellitus with other diabetic neurological complication: Secondary | ICD-10-CM

## 2012-05-08 DIAGNOSIS — F09 Unspecified mental disorder due to known physiological condition: Secondary | ICD-10-CM

## 2012-05-08 DIAGNOSIS — G909 Disorder of the autonomic nervous system, unspecified: Secondary | ICD-10-CM

## 2012-05-08 DIAGNOSIS — E1042 Type 1 diabetes mellitus with diabetic polyneuropathy: Secondary | ICD-10-CM

## 2012-05-08 DIAGNOSIS — R4189 Other symptoms and signs involving cognitive functions and awareness: Secondary | ICD-10-CM

## 2012-05-08 DIAGNOSIS — R42 Dizziness and giddiness: Secondary | ICD-10-CM

## 2012-05-08 DIAGNOSIS — E785 Hyperlipidemia, unspecified: Secondary | ICD-10-CM

## 2012-05-08 LAB — COMPREHENSIVE METABOLIC PANEL
Albumin: 3.9 g/dL (ref 3.5–5.2)
BUN: 13 mg/dL (ref 6–23)
CO2: 30 mEq/L (ref 19–32)
Calcium: 8.8 mg/dL (ref 8.4–10.5)
GFR: 78.26 mL/min (ref >60.00)
Glucose, Bld: 302 mg/dL — ABNORMAL HIGH (ref 70–99)
Potassium: 5.2 mEq/L — ABNORMAL HIGH (ref 3.5–5.1)
Sodium: 134 mEq/L — ABNORMAL LOW (ref 135–145)
Total Protein: 6.9 g/dL (ref 6.0–8.3)

## 2012-05-08 LAB — LIPID PANEL
LDL Cholesterol: 120 mg/dL — ABNORMAL HIGH (ref 0–99)
Total CHOL/HDL Ratio: 3

## 2012-05-08 LAB — MICROALBUMIN / CREATININE URINE RATIO
Creatinine,U: 67.6 mg/dL
Microalb, Ur: 2.6 mg/dL — ABNORMAL HIGH (ref 0.0–1.9)

## 2012-05-08 NOTE — Progress Notes (Signed)
OFFICE NOTE  05/08/2012  CC:  Chief Complaint  Patient presents with  . Follow-up    5-mth [DM; Hypothyroid; Hyperlipid; CAD; Venous Insufficiency]     HPI: Patient is a 62 y.o. Caucasian male who is here for 5 mo f/u hyperlipidemia, CAD, DM 2. Taking 1/2 atorv 80 qd, tolerating it well.  Admits he is not eating a strict low chol/low fat and diabetic diet. Last visit I thought we had decided to get him on crestor but somehow this never happened--neither the patient or I can figure out what happened/why the confusion.  At any rate, he is due for repeat chol testing today.    He is no longer going to his endocrinologist, says he was not too impressed with the care there.  He still uses an insulin pump. I told him I did not feel comfortable managing an insulin pump so we agreed to refer him to a local endocrinologist for ongoing mgmt of his DM 1.  He is overdue for both HbA1c and TSH monitoring.  He has been compliant with his CPAP machine and says this has made him feel MUCH better regarding his excessive daytime somnolence.    New complaint today: approx 12-18 mo hx of "dizziness", best described as a sense of disequilibrium or "off balance" feeling, says it is constant.  Nothing in particular makes it better or worse.  No vertigo, no nausea, no hearing changes, no ringing in ears.  He says he feels like this feeling of imbalance is more in his head than in his feet.  He denies any feeling of numbness, tingling, or pain in his feet.   He is also concerned with a gradual decline in his short term memory over this same time period: things like word-finding difficulty and short term recall are at a level that he is getting worried that "something is wrong" in his head.  He is still functioning well at work, has no problems with ADLs.  Denies HAs.  Denies any new vision abnormalities but admits his chronic, permanent vision loss from his diabetic retinopathy is likely contributing some to his balance  complaints.    ROS: no focal weakness, no dysarthria, no swallowing problems, no tremor.   No postural dizziness/presyncope/syncope.   No CP, no SOB, no abd pain, no rash, no myalgias or arthralgias.  Pertinent PMH:  Past Medical History  Diagnosis Date  . Diabetes mellitus Dx'd age 62    Novolog via insulin pump; sub-optimal control x years  . High cholesterol     Takes 1/2 of 80mg  atorvastatin daily  . Hypothyroidism     Thyroidectomy 2002; multinodular goiter  . Erectile dysfunction     [place]rologist [place]  . Diabetic retinopathy(362.0)     Proliferative: Hx of retinal detachment on right-light perception only in right eye  . History of tobacco abuse     Quit about 1990 (has 35 pack-yr hx)  . Heart murmur, systolic   . OSA (obstructive sleep apnea)     06/2011 sleep study: moderate OSA, CPAP at 12 cm H2O recommended.  . Impaired vision     right eye light perception only; hx of retinal detachment.  . Recurrent pneumonia   . GERD (gastroesophageal reflux disease)   . Venous insufficiency   . Diastolic dysfunction 2007    TEE with diastolic dysfunction, EF 74%.  . Diabetic neuropathy   . Aortic stenosis, mild Sept/Oct /2013    Mild/mod by echo; mild by cath 10/28/11.  [name]  Carotid stenosis, right 09/2011    50-70% stenosis  . 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  Med mgmt recommended.  . Abnormal stress test 10/27/2011    cardiac cath 10/28/2011  . Venous reflux 10/14/2011    venous doppler-R GSV continuous reflux throughout; too small for VNUS closure  . Decreased pedal pulses     LE doppler 11/08/11- no evidence of arterial insufficiency    MEDS:  Outpatient Prescriptions Prior to Visit  Medication Sig Dispense Refill  . aspirin EC 81 MG tablet Take 81 mg by mouth daily.      [name] atorvastatin (LIPITOR) 80 MG tablet Take 1 tablet (80 mg total) by mouth daily.  30 tablet   2  . clotrimazole-betamethasone (LOTRISONE) cream Apply to affected area twice daily as needed for rash  15 g  2  . Coenzyme Q10 (CO Q 10) 100 MG CAPS Take 1 capsule by mouth daily.      . Insulin Human (INSULIN PUMP) 100 unit/ml SOLN Inject 1 each into the skin continuous. Novolog in Pump      . levothyroxine (SYNTHROID, LEVOTHROID) 137 MCG tablet Take 1 tablet (137 mcg total) by mouth daily.  100 tablet  0  . lisinopril (PRINIVIL,ZESTRIL) 2.5 MG tablet Take 2.5 mg by mouth daily.      . nebivolol (BYSTOLIC) 5 MG tablet Take 5 mg by mouth daily.      . Omega-3 Fatty Acids (FISH OIL) 500 MG CAPS Take 1 capsule by mouth daily.      . tadalafil (CIALIS) 20 MG tablet Take 20 mg by mouth daily as needed. For sexual activity       No facility-administered medications prior to visit.    PE: Blood pressure 144/78, pulse 84, temperature 97.9 F (36.6 C), temperature source Oral, resp. rate 16, weight 215 lb 4 oz (97.637 kg), SpO2 97.00%. Gen: Alert, well appearing.  Patient is oriented to person, place, time, and situation. AFFECT: pleasant, lucid thought and speech. Neck - No masses or thyromegaly or limitation in range of motion CV: RRR, 2/6 syst murmur, no diastolic murmur.  No rub or gallop. LUNGS: CTA bilat, nonlabored resps. EXT: trace to 1+ pitting edema bilat in pretibial regions/ankles. FEET: No skin breakdown or erythema.  Decreased sensation diffusely to monofilament testing and position sense testing.   MMSE: 29/30 (2 out of 3 on recall)  IMPRESSION AND PLAN:  Mild cognitive impairment Reassured pt. Could be combo of normal aging, neurologic microvascular ischemic dz, early alzheimer's dz, medication effects, and chronic erratic serum glucoses. However, since this has occurred coincidental with his gradually worsening disequilibrium problem, I feel neuroimaging is warranted to get more solid info on possible remote cerebellar CVA and/or evidence of microvascular neuro dz.   Will  order noncontrast brain MRI. He declines neurologic referral at this time.  Disequilibrium syndrome Likely multifactorial: hx of erratic/fluctuating serum glucoses, medication effects, vision impairment, and diabetic peripheral neuropathy. Would like to check noncontrast brain MRI to further eval for mass or remote cerebellar infarct.  DM type 1 with diabetic peripheral neuropathy Also with diabetic retinopathy. Patient has insulin pump and given his dissatisfaction with current endocrinologist in [place], he is ready for referral to new endocrinologist to manage his DM/insulin pump. Referral to [place] endo ordered today. Will check HbA1c and urine microalbumin/cr today.  Hyperlipidemia Due for repeat FLP today.   An After Visit  Summary was printed and given to the patient.  Spent 60 min with pt today, with >50% of this time spent in counseling and care coordination regarding the above problems.   FOLLOW UP: 37mo

## 2012-05-09 ENCOUNTER — Encounter: Payer: Self-pay | Admitting: Family Medicine

## 2012-05-09 DIAGNOSIS — E1059 Type 1 diabetes mellitus with other circulatory complications: Secondary | ICD-10-CM | POA: Insufficient documentation

## 2012-05-09 DIAGNOSIS — G3184 Mild cognitive impairment, so stated: Secondary | ICD-10-CM | POA: Insufficient documentation

## 2012-05-09 DIAGNOSIS — E1065 Type 1 diabetes mellitus with hyperglycemia: Secondary | ICD-10-CM | POA: Insufficient documentation

## 2012-05-09 NOTE — Assessment & Plan Note (Signed)
Also with diabetic retinopathy. Patient has insulin pump and given his dissatisfaction with current endocrinologist in [place], he is ready for referral to new endocrinologist to manage his DM/insulin pump. Referral to [place] endo ordered today. Will check HbA1c and urine microalbumin/cr today.

## 2012-05-09 NOTE — Assessment & Plan Note (Signed)
Due for repeat FLP today.

## 2012-05-09 NOTE — Assessment & Plan Note (Signed)
Reassured pt. Could be combo of normal aging, neurologic microvascular ischemic dz, early alzheimer's dz, medication effects, and chronic erratic serum glucoses. However, since this has occurred coincidental with his gradually worsening disequilibrium problem, I feel neuroimaging is warranted to get more solid info on possible remote cerebellar CVA and/or evidence of microvascular neuro dz.   Will order noncontrast brain MRI. He declines neurologic referral at this time.

## 2012-05-09 NOTE — Assessment & Plan Note (Signed)
Likely multifactorial: hx of erratic/fluctuating serum glucoses, medication effects, vision impairment, and diabetic peripheral neuropathy. Would like to check noncontrast brain MRI to further eval for mass or remote cerebellar infarct.

## 2012-05-10 ENCOUNTER — Telehealth: Payer: Self-pay | Admitting: *Deleted

## 2012-05-10 ENCOUNTER — Encounter: Payer: Self-pay | Admitting: *Deleted

## 2012-05-10 MED ORDER — ATORVASTATIN CALCIUM 40 MG PO TABS: 40.0000 mg | ORAL_TABLET | Freq: Every day | ORAL | Status: DC

## 2012-05-10 NOTE — Telephone Encounter (Signed)
Message copied by Regis Bill on Thu May 10, 2012  4:22 PM ------      Message from: [name]      Created: Thu May 10, 2012  2:18 PM       Pls notify pt that his recent labs showed a few abnormalities: his HbA1c was 10% (avg glucose over the last 3-4 months=240).  I recommend he increase his basal rate on his insulin pump by 15%.  He'll likely get further instructions regarding insulin adjustments when he sees the new endocrinologist.  Also, his potassium was borderline high--nothing to do at this time except make sure he is not taking any OTC potassium supplements.      Finally, his "bad cholesterol" number was 120.  He should TRY to take a whole 80mg  atorvastatin tab once daily.  If he doesn't tolerate this then he can go back to the 1/2 tab dosing.      All other tests were normal--thx ------

## 2012-05-10 NOTE — Telephone Encounter (Signed)
Patient informed, understood & agreed; new Rx for Atorvastatin to requested pharmacy [pt was previously taking 20 mg, provider informed & per VO, PHM, increase to 40 mg]/SLS

## 2012-05-14 ENCOUNTER — Telehealth: Payer: Self-pay | Admitting: Family Medicine

## 2012-05-14 MED ORDER — LISINOPRIL 2.5 MG PO TABS: 2.5000 mg | ORAL_TABLET | Freq: Every day | ORAL | Status: DC

## 2012-05-14 MED ORDER — ATORVASTATIN CALCIUM 40 MG PO TABS: 20.0000 mg | ORAL_TABLET | Freq: Every day | ORAL | Status: DC

## 2012-05-14 NOTE — Telephone Encounter (Addendum)
Spoke w/patient: reports flushing in face; lightheadedness; feeling pulse in top of head; SOB w/Exertion [NO H/A, Blurred vision, chest pain], pt also reports muscle pain believed to be d/t increase of Atorvastatin. Pt informed to Hold Bystolic & Atorvastatin until response from provider. Patient reports glucose: 118 am & 180 lunch [reports 240-250 is average]/SLS Patient understood & agreed/SLS

## 2012-05-14 NOTE — Telephone Encounter (Signed)
Per patient, d/c Bystolic & Atorvastatin in January '14 d/t increased CBGs; just resumed Bystolic last week [w/new dosage on Atorvastatin], not taking Lisinopril since January '14. Per provider VO, pt informed to resume lower dosage on atorvastatin [20 mg daily, changed in EMR], D/C Bystolic; if no symptoms and/or episodes for [3] days straight, then resume Lisinopril [new Rx to pharmacy]/SLS Patient understood & agreed/SLS

## 2012-05-14 NOTE — Telephone Encounter (Signed)
Patient informed, understood & agreed/SLS Patient states that he p/u medication [Bystolic & Atorvastatin] on Thursday or Friday of last week and the episodes began the following day "upon standing with movement, not really call it w/exertion"; the episodes now "seems to come with exertion"/SLS Please advise.

## 2012-05-14 NOTE — Telephone Encounter (Signed)
May resume prior (lower) dose of atorvastatin. Continue bystolic as per his normal b/c I don't think this med has anything to do with the episode. I am interested in knowing the sequence of events: did he begin to ambulate and THEN start having the flushing/SOB/dizziness/pulse in top of head, or was this episode "out of the blue"--without provocation like exertion or walking.  Also, have there been other episodes similar to this recently (in the last 3-4 wks)?

## 2012-05-29 ENCOUNTER — Ambulatory Visit: Payer: PRIVATE HEALTH INSURANCE | Admitting: Internal Medicine

## 2012-05-31 ENCOUNTER — Ambulatory Visit (HOSPITAL_COMMUNITY)
Admission: RE | Admit: 2012-05-31 | Discharge: 2012-05-31 | Disposition: A | Discharge status: Home / Self Care | Payer: PRIVATE HEALTH INSURANCE | Source: Ambulatory Visit | Attending: Cardiovascular Disease | Admitting: Cardiovascular Disease

## 2012-05-31 ENCOUNTER — Ambulatory Visit (HOSPITAL_BASED_OUTPATIENT_CLINIC_OR_DEPARTMENT_OTHER)
Admission: RE | Admit: 2012-05-31 | Discharge: 2012-05-31 | Disposition: A | Discharge status: Home / Self Care | Payer: PRIVATE HEALTH INSURANCE | Source: Ambulatory Visit | Attending: Cardiovascular Disease | Admitting: Cardiovascular Disease

## 2012-05-31 DIAGNOSIS — I739 Peripheral vascular disease, unspecified: Secondary | ICD-10-CM

## 2012-05-31 DIAGNOSIS — I517 Cardiomegaly: Secondary | ICD-10-CM | POA: Insufficient documentation

## 2012-05-31 DIAGNOSIS — I350 Nonrheumatic aortic (valve) stenosis: Secondary | ICD-10-CM

## 2012-05-31 DIAGNOSIS — I2510 Atherosclerotic heart disease of native coronary artery without angina pectoris: Secondary | ICD-10-CM | POA: Insufficient documentation

## 2012-05-31 DIAGNOSIS — I6529 Occlusion and stenosis of unspecified carotid artery: Secondary | ICD-10-CM

## 2012-05-31 DIAGNOSIS — I379 Nonrheumatic pulmonary valve disorder, unspecified: Secondary | ICD-10-CM | POA: Insufficient documentation

## 2012-05-31 DIAGNOSIS — I059 Rheumatic mitral valve disease, unspecified: Secondary | ICD-10-CM | POA: Insufficient documentation

## 2012-05-31 DIAGNOSIS — I6521 Occlusion and stenosis of right carotid artery: Secondary | ICD-10-CM

## 2012-05-31 DIAGNOSIS — I359 Nonrheumatic aortic valve disorder, unspecified: Secondary | ICD-10-CM

## 2012-05-31 NOTE — Progress Notes (Signed)
2D Echo Performed 05/31/2012    Ayden Hardwick, RCS  

## 2012-05-31 NOTE — Progress Notes (Signed)
Carotid Duplex Completed. Kavari Parrillo, RDMS, RVT  

## 2012-06-01 ENCOUNTER — Ambulatory Visit (INDEPENDENT_AMBULATORY_CARE_PROVIDER_SITE_OTHER): Payer: PRIVATE HEALTH INSURANCE | Admitting: Internal Medicine

## 2012-06-01 ENCOUNTER — Encounter: Payer: Self-pay | Admitting: Internal Medicine

## 2012-06-01 VITALS — BP 126/62 | HR 76 | Temp 97.6°F | Resp 12 | Ht 73.75 in | Wt 219.0 lb

## 2012-06-01 DIAGNOSIS — G909 Disorder of the autonomic nervous system, unspecified: Secondary | ICD-10-CM

## 2012-06-01 DIAGNOSIS — E1042 Type 1 diabetes mellitus with diabetic polyneuropathy: Secondary | ICD-10-CM

## 2012-06-01 DIAGNOSIS — E1049 Type 1 diabetes mellitus with other diabetic neurological complication: Secondary | ICD-10-CM

## 2012-06-01 MED ORDER — GLUCAGON (RDNA) 1 MG IJ KIT: 1.0000 mg | PACK | Freq: Once | INTRAMUSCULAR | Status: DC | PRN

## 2012-06-01 NOTE — Progress Notes (Signed)
Patient ID: [name], male   DOB: [birth year], 62 y.o.   MRN: [id number]  HPI: [name] is a 62 y.o.-year-old male, referred by his PCP, Dr. Milinda Cave, for management of DM1, uncontrolled, with complications (Coronary artery disease, proliferative diabetic retinopathy-history of retinal detachment OD, right carotid stenosis, PVD, peripheral neuropathy, ED).  Patient has been diagnosed with diabetes in 1964 (at 62 y/o); he has started insulin from dx. Last hemoglobin A1c was: Lab Results  Component Value Date   HGBA1C 10.0* 05/08/2012  previously 9.4, previously 9.5.  Pt is on an insulin pump: One Touch Ping [facility]) with NovoLog x 5 years.  Pump settings: - basal rate: 0.8 U/h - TDD19.45 - ICR: 14 - ISF: 12 am: 60, 5:30 am: 40, 10 pm: 60 - target 140 - IOB: 3 h  - primes ~q3 days He gets approximately 20 to 33 units TDD from boluses He boluses after he eats!  Pt checks his sugars 4-6 a day and they are: - am: 150-250 - before lunch: 180-275 - before dinner: 180-275 - bedtime: can go to 300s No lows. Lowest sugar was 50s - at night, 3 am; he has hypoglycemia awareness at 100. He had confusion x 3 this past year. He will have a brain MRI tomorrow.  Pt's meals are: - Breakfast: 1.5 scramble eggs, toast, milk - 65 g carbs - Lunch: sandwich, shaved chicken or [place] breast, cheese, mayo, chips, soda - 100 carbs - Dinner: meat, vegetables, bread, fruit, milk - 100 g carbs - Snacks: 2-3 a day: bagel midmorning, evening snack: chips, popcorn  Pt does not have chronic kidney disease, last BUN/creatinine was:  Lab Results  Component Value Date   BUN 13 05/08/2012   CREATININE 1.0 05/08/2012  Last microalbumin/creatinine ratio was 3.8.  Last set of lipids: Lab Results  Component Value Date   CHOL 200 05/08/2012   HDL 65.40 05/08/2012   LDLCALC 120* 05/08/2012   LDLDIRECT 159.5 04/21/2011   TRIG 71.0 05/08/2012   CHOLHDL 3 05/08/2012   Pt's last eye exam was in 12/2011. + DR - OD, s/p  detached retina. Has numbness and tingling in his legs.  I reviewed his chart and he also has a history of hyperlipidemia, disequilibrium, mild cognitive impairment, hypothyroidism-status post thyroidectomy for multinodular goiter - last TSH 0.78 on 05/08/2012, OSA-on CPAP, GERD.  No FH of DM.  ROS: Constitutional: + weight gain, +  fatigue, no subjective hyperthermia/hypothermia Eyes: no blurry vision, no xerophthalmia ENT: no sore throat, no nodules palpated in throat, no dysphagia/odynophagia, no hoarseness Cardiovascular: no CP/+SOB/palpitations/+ leg swelling - wears compression hoses Respiratory: no cough/+SOB Gastrointestinal: no N/V/D/C Musculoskeletal: + muscle aches/no joint aches Skin: no rashes Neurological: no tremors/numbness/tingling/dizziness Psychiatric: no depression/anxiety Diff. w/ erections Past Medical History  Diagnosis Date  . Diabetes mellitus Dx'd age 24    Novolog via insulin pump; sub-optimal control x years  . High cholesterol     Takes 1/2 of 80mg  atorvastatin daily  . Hypothyroidism     Thyroidectomy 2002; multinodular goiter  . Erectile dysfunction     [place]rologist [place]  . Diabetic retinopathy     Proliferative: Hx of retinal detachment on right-light perception only in right eye  . History of tobacco abuse     Quit about 1990 (has 35 pack-yr hx)  . Heart murmur, systolic   . OSA (obstructive sleep apnea)     06/2011 sleep study: moderate OSA, CPAP at 12 cm H2O.  . Impaired vision  right eye light perception only; hx of retinal detachment.  . Recurrent pneumonia   . GERD (gastroesophageal reflux disease)   . Venous insufficiency   . Diastolic dysfunction 2007    TEE with diastolic dysfunction, EF 74%.  . Diabetic neuropathy     fine touch and position sense affected  . Aortic stenosis, mild Sept/Oct /2013    Mild/mod by echo; mild by cath 10/28/11.  . Carotid stenosis, right 09/2011    50-70% stenosis  . CAD, multiple vessel 10/2011     Inferolateral perfusion defect + EKG abnormality during stress testing prompted Cath by [facility]; EF normal.  Med mgmt recommended.  . Abnormal stress test 10/27/2011    cardiac cath 10/28/2011  . Venous reflux 10/14/2011    venous doppler-R GSV continuous reflux throughout; too small for VNUS closure  . Decreased pedal pulses     LE doppler 11/08/11- no evidence of arterial insufficiency   Past Surgical History  Procedure Laterality Date  . Thyroid surgery  2002  . Lasix eye    . Eye surgery      Multiple laser surgeries for diabetic retinopathy, also cataract surgery [facility].  [name] Retinal detachment surgery      Right eye  . Cataract extraction      left  . Transthoracic echocardiogram  10/2011    mild/mod aortic stenosis--being followed by Dr. [name]  . Cardiac catheterization  10/2011    EF normal.  Diffuse 3 vessel CAD, no stents placed.  Medical mgmt per [facility].   History   Social History  . Marital Status: Married    Spouse Name: N/A    Number of Children: 3   Occupational History  . 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 Smoking status: Former Games developer, quit in 1989  . Smokeless tobacco: Never Used  . Alcohol Use: Yes, wine, once a  month  . Drug Use: No   Social History Narrative   Divorced x1, remarried.  Has 3 daughters from first marriage.   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   Distant tobacco abuse (35 pack-yr hx), no ETOH or drugs.   No exercise.   Current Outpatient Prescriptions on File Prior to Visit  Medication Sig Dispense Refill  . aspirin EC 81 MG tablet Take 81 mg by mouth daily.      [name] atorvastatin (LIPITOR) 40 MG tablet Take 0.5 tablets (20 mg total) by mouth daily.  30 tablet  2  . clotrimazole-betamethasone (LOTRISONE) cream Apply to affected area twice daily as needed for rash  15 g  2  .  Coenzyme Q10 (CO Q 10) 100 MG CAPS Take 1 capsule by mouth daily.      . Insulin Human (INSULIN PUMP) 100 unit/ml SOLN Inject 1 each into the skin continuous. Novolog in Pump      . levothyroxine (SYNTHROID, LEVOTHROID) 137 MCG tablet Take 1 tablet (137 mcg total) by mouth daily.  100 tablet  0  . lisinopril (PRINIVIL,ZESTRIL) 2.5 MG tablet Take 1 tablet (2.5 mg total) by mouth daily.  30 tablet  2  . Omega-3 Fatty Acids (FISH OIL) 500 MG CAPS Take 1 capsule by mouth daily.      . tadalafil (CIALIS) 20 MG tablet Take 20 mg by mouth daily as needed. For sexual activity  No current facility-administered medications on file prior to visit.   Allergies  Allergen Reactions  . Statins     Problem with higher dosages.  . Tape Rash    Adhesive tape.  (Paper tape OK)   Family History  Problem Relation Age of Onset  . Cancer Mother     lung cancer  . Alcohol abuse Father   . Cancer Father     laryngeal cancer  . Cancer Brother     oldest brother had lung cancer and melanoma   PE: BP 126/62  Pulse 76  Temp(Src) 97.6 F (36.4 C) (Oral)  Resp 12  Ht 6' 1.75" (1.873 m)  Wt 219 lb (99.338 kg)  BMI 28.32 kg/m2  SpO2 96% Wt Readings from Last 3 Encounters:  06/01/12 219 lb (99.338 kg)  05/08/12 215 lb 4 oz (97.637 kg)  01/20/12 218 lb (98.884 kg)   Constitutional: slightly overweight, in NAD Eyes: PERRLA, EOMI, no exophthalmos ENT: moist mucous membranes, no thyromegaly, no cervical lymphadenopathy Cardiovascular: RRR, No MRG Respiratory: CTA B Gastrointestinal: abdomen soft, NT, ND, BS+ Musculoskeletal: no deformities, strength intact in all 4 Skin: moist, warm, no rashes Neurological: no tremor with outstretched hands, DTR normal in all 4  ASSESSMENT: 1. DM1, uncontrolled, with complications - CAD, last 2-D echo 05/31/2012: EF 60-65%, mild LVF, moderate aortic stenosis, no LVWMA - proliferative diabetic retinopathy-history of retinal detachment OD - right carotid stenosis  - had carotid duplex study 05/31/2012 - PVD - peripheral neuropathy - ED  PLAN:  1.  Patient with long-standing type 1 diabetes, poorly controlled, with multiple complications, on an insulin pump, however without good understanding of the pump principles and settings. We obtained a settings from the pump together, and she did not quite know how to access them. - Sugars remained high throughout the day, but he might have some lows at night, although I am not quite sure how often these happen and if these do not happen due to his bolusing in the evening - he gets the same or more total daily dose of insulin from bolusing compared to basal, so he is not underbolusing, but more likely  the basal rate would need to be redistributed rather than decreased so that he does not get lows at night - The first thing that I advised him to do was to move his bolusing 15 minutes before he starts eating. He is bolusing after he finished eating, especially since he has slow boluses, I believe he was missing the after-meal CBG peaks - we also changed the insulin to carb ratio from 40 to 30. He had it set up for 60 in the middle of the night, however he does not eat then. - we kept the ISF at 14 for now - given sugar log and advised how to fill it and to bring it at next appt - given foot care handout and explained the principles - given instructions for hypoglycemia management "15-15 rule" - given general info Re: DM1 ( please see pt's instructions). - I sent a prescription for glucagon his pharmacy. He had a glucagon kit in the past, not anymore. He knows how to use it and already taught his wife how to do so - given brochure of One Touch Ping with forms and tel nrs needed to change his pump since he already has it for 5 years - at next visit we need to do a foot exam, and I would also need to refer him to diabetes  education - I will see him in 2 weeks with his sugar log. If he experiences any more lows at night, I  would decrease his basal rate from 12 to 3 or 4 AM and likely increase the morning basal rate

## 2012-06-01 NOTE — Patient Instructions (Addendum)
Please return in 2 weeks with your sugar log.  Please decrease the ICR to 30. Keep the ISF at 14. Increase the insulin on board (IOB) time to 4 h. Please start bolusing 15 minutes before a meal.   Basic Rules for Patients with Type I Diabetes Mellitus  1. The American Diabetes Association (ADA) recommended targets: - fasting sugar <130 - after meal sugar <180 - HbA1C <7%  2. Engage in ?150 min moderate exercise per week  3. Make sure you have ?8h of sleep every night as this helps both blood sugars and your weight.  4. Always keep a sugar log (not only record in your meter) and bring it to all appointments with [place].  5. If you are on a pump, know how to access the settings and to modify the parameters.  6.  Remember, you can always call the number on the back of the pump for emergencies related to the pump.  7. "15-15 rule" for hypoglycemia: if sugars are low, take 15 g of carbs** ("fast sugar" - e.g. 4 glucose tablets, 4 oz orange juice), wait 15 min, then check sugars again. If still <80, repeat. Continue  until your sugars >80, then eat a normal meal.   8. Teach family members and coworkers to inject glucagon. Have a glucagon set at home and one at work. They should call 911 after using the set.  9. If you are on a pump, set "insulin on board" time for 5 hours (if your sugars tend to be higher, can use 4 hours).   10. If you are on a pump, use the "dual wave bolus" setting for high fat foods (e.g. pizza). Start with a setting of 50%-50% (50% instant bolus and 50% prolonged bolus over 3h, for e.g.).    11. If you are on a pump, make sure the basal daily insulin dose is approximately equal (not larger) to the daily insulin you get from boluses, otherwise you are at risk for hypoglycemia.  12. Check sugar before driving. If <100, correct, and only start driving if sugars rise ?161. Check sugar every hour when on a long drive.  13. Check sugar before exercising. If <100, correct,  and only start exercising if sugars rise ?100. Check sugar every hour when on a long exercise routine and 1h after you finished exercising.   If >250, check urine for ketones. If you have moderate-large ketones in urine, do not start exercise. Hydrate yourself with clear liquids and correct the high sugar. Recheck sugars and ketones before attempting to exercise.  Be aware that you might need less insulin when exercising.  *intense, short, exercise bursts can increase your sugars, but  *less intense, longer (>1h), exercise routines can decrease your sugars.  If you are on a pump, you might need to decrease your basal rate by 10% or more (or even disconnect your pump) while you exercise to prevent low sugars. Do not disconnect your pump by more than 3 hours at a time! You also might need to decrease your insulin bolus for the meal prior to your exercise time by 20% or more.  14. Make sure you have a MedAlert bracelet or pendant mentioning "Type I Diabetes Mellitus". If you have a prior episode of severe hypoglycemia or hypoglycemia unawareness, it should also mention this.  15. Please do not walk barefoot. Inspect your feet for sores/cuts and let us know if you have them.  16. Please call [facility] with any questions and concerns (  [phone]).   **E.g. of "fast carbs":   first choice (15 g):  1 tube glucose gel, GlucoPouch 15, 2 oz glucose liquid   second choice (15-16 g):  3 or 4 glucose tablets (best taken  with water), 15 Dextrose Bits chewable   third choice (15-20 g):   cup fruit juice,  cup regular soda, 1 cup skim milk,  1 cup sports drink   fourth choice (15-20 g):  1 small tube [place] gel (not frosting), 2 tbsp raisins, 1 tbsp table sugar,  candy, jelly beans, gum drops - check package for carb amount   (adapted from: Juluis Rainier. "Insulin therapy and hypoglycemia" Endocrinol Metab Clin N Am 2012, 41: 57-87)

## 2012-06-02 ENCOUNTER — Ambulatory Visit (HOSPITAL_BASED_OUTPATIENT_CLINIC_OR_DEPARTMENT_OTHER)
Admission: RE | Admit: 2012-06-02 | Discharge: 2012-06-02 | Disposition: A | Discharge status: Home / Self Care | Payer: PRIVATE HEALTH INSURANCE | Source: Ambulatory Visit | Attending: Family Medicine | Admitting: Family Medicine

## 2012-06-02 DIAGNOSIS — R413 Other amnesia: Secondary | ICD-10-CM | POA: Insufficient documentation

## 2012-06-02 DIAGNOSIS — R9409 Abnormal results of other function studies of central nervous system: Secondary | ICD-10-CM | POA: Insufficient documentation

## 2012-06-02 DIAGNOSIS — R42 Dizziness and giddiness: Secondary | ICD-10-CM

## 2012-06-02 DIAGNOSIS — G3184 Mild cognitive impairment, so stated: Secondary | ICD-10-CM | POA: Insufficient documentation

## 2012-06-02 DIAGNOSIS — R269 Unspecified abnormalities of gait and mobility: Secondary | ICD-10-CM | POA: Insufficient documentation

## 2012-06-02 DIAGNOSIS — R4189 Other symptoms and signs involving cognitive functions and awareness: Secondary | ICD-10-CM

## 2012-06-18 ENCOUNTER — Encounter: Payer: Self-pay | Admitting: Cardiovascular Disease

## 2012-06-21 ENCOUNTER — Ambulatory Visit (INDEPENDENT_AMBULATORY_CARE_PROVIDER_SITE_OTHER): Payer: PRIVATE HEALTH INSURANCE | Admitting: Cardiovascular Disease

## 2012-06-21 ENCOUNTER — Encounter: Payer: Self-pay | Admitting: Cardiovascular Disease

## 2012-06-21 VITALS — BP 120/70 | HR 76 | Ht 74.0 in | Wt 216.0 lb

## 2012-06-21 DIAGNOSIS — I6529 Occlusion and stenosis of unspecified carotid artery: Secondary | ICD-10-CM

## 2012-06-21 DIAGNOSIS — E785 Hyperlipidemia, unspecified: Secondary | ICD-10-CM

## 2012-06-21 DIAGNOSIS — I350 Nonrheumatic aortic (valve) stenosis: Secondary | ICD-10-CM

## 2012-06-21 DIAGNOSIS — I6521 Occlusion and stenosis of right carotid artery: Secondary | ICD-10-CM

## 2012-06-21 DIAGNOSIS — Z79899 Other long term (current) drug therapy: Secondary | ICD-10-CM

## 2012-06-21 DIAGNOSIS — I359 Nonrheumatic aortic valve disorder, unspecified: Secondary | ICD-10-CM

## 2012-06-21 DIAGNOSIS — I2510 Atherosclerotic heart disease of native coronary artery without angina pectoris: Secondary | ICD-10-CM

## 2012-06-21 MED ORDER — EZETIMIBE 10 MG PO TABS: 10.0000 mg | ORAL_TABLET | Freq: Every day | ORAL | Status: DC

## 2012-06-21 NOTE — Assessment & Plan Note (Signed)
Recent 2-D echo reveals normal LV function with an aortic valve area of approximately 1 cm suggesting mild progression of disease. His mean gradient is 23 with a peak of 38. We'll continue to follow this on an annual basis.

## 2012-06-21 NOTE — Patient Instructions (Signed)
  Your physician wants you to follow-up with him in : 12 months                                            and with an extender in : 6 months                    You will receive a reminder letter in the mail one month in advance. If you don't receive a letter, please call our office to schedule the follow-up appointment.   Your physician recommends that you return for lab work in: 3 months, fasting   Your physician has recommended you make the following change in your medication: start Zetia 10mg  daily   Your physician has ordered the following tests: echocardiogram in 1 year and carotid dopplers in 6 months

## 2012-06-21 NOTE — Assessment & Plan Note (Signed)
Recent carotid Dopplers suggest moderate right ICA stenosis in the 50-70% range unchanged from his prior carotid Doppler study. He is on aspirin. He was neurologically asymptomatic. We'll continue to follow this on a semiannual basis.

## 2012-06-21 NOTE — Assessment & Plan Note (Signed)
The patient has known 3 vessel disease by cardiac catheterization performed by Dr. Daphene Jaeger 10/28/11. He had 50-70% blockages in his dominant RCA, circumflex obtuse marginal branch and mid LAD with normal LV function. A Myoview performed just prior to that cath did show mild lateral ischemia. He denies chest pain or shortness of breath. He is on adequate antianginal medications.

## 2012-06-21 NOTE — Assessment & Plan Note (Signed)
On atorvastatin 20 with lipid profile followed by his primary care physician. Apparently has not ankle. He did experience side effects when he was on Lipitor 40 mg a day. I'm going to add Zetia 10 mg and we'll recheck a lipid and liver profile in 2 months.

## 2012-06-21 NOTE — Progress Notes (Signed)
06/21/2012 [name]   Jan 26, 1950  [id number]  Primary Physician [name], MD Primary Cardiologist: Runell Gess MD [name]   HPI:  The patient returns today for followup. He is a 62 year old mildly overweight married Caucasian male, father of 3 and grandfather to 3 grandchildren, whom I initially saw October 12, 2011. He has a history of insulin-dependent diabetes on insulin pump, mild sleep apnea, and hyperlipidemia as well as mild aortic stenosis. He had a Myoview stress test that was abnormal which led to a heart catheterization performed by Dr. Tresa Endo in my absence revealing 70% disease in all 3 vessels with preserved LV function and mild AS. He also has moderate right ICA stenosis. He is neurologically asymptomatic. He was placed on beta-blocker and Ranexa. Apparently he is having mood swings, which may be a side effect of one of his medications. Telephone him 6 months ago he denies chest pain or shortness of breath. He has experienced some long-term memory loss and has had this worked up with an MRI of his brain. A concern this may be related to his statin therapy. We did recheck a 2-D echo which showed preserved LV function with mild progression of his aortic stenosis. Carotid Dopplers showed stable moderate right ICA stenosis.     Current Outpatient Prescriptions  Medication Sig Dispense Refill  . aspirin EC 81 MG tablet Take 81 mg by mouth daily.      [name] atorvastatin (LIPITOR) 40 MG tablet Take 0.5 tablets (20 mg total) by mouth daily.  30 tablet  2  . Coenzyme Q10 (CO Q 10) 100 MG CAPS Take 1 capsule by mouth daily.      . Insulin Human (INSULIN PUMP) 100 unit/ml SOLN Inject 1 each into the skin continuous. Novolog in Pump      . levothyroxine (SYNTHROID, LEVOTHROID) 137 MCG tablet Take 1 tablet (137 mcg total) by mouth daily.  100 tablet  0  . lisinopril (PRINIVIL,ZESTRIL) 2.5 MG tablet Take 1 tablet (2.5 mg total) by mouth daily.  30 tablet  2  .  Omega-3 Fatty Acids (FISH OIL) 500 MG CAPS Take 1 capsule by mouth daily.      . tadalafil (CIALIS) 20 MG tablet Take 20 mg by mouth daily as needed. For sexual activity      . clotrimazole-betamethasone (LOTRISONE) cream Apply to affected area twice daily as needed for rash  15 g  2  . glucagon (GLUCAGON EMERGENCY) 1 MG injection Inject 1 mg into the muscle once as needed.  2 each  prn   No current facility-administered medications for this visit.    Allergies  Allergen Reactions  . Statins     Problem with higher dosages.  . Tape Rash    Adhesive tape.  (Paper tape OK)    History   Social History  . Marital Status: Married    Spouse Name: N/A    Number of Children: N/A  . Years of Education: N/A   Occupational History  . Not on file.   Social History Main Topics  . Smoking status: Former Games developer  . Smokeless tobacco: Former Neurosurgeon    Quit date: 06/22/1987  . Alcohol Use: Yes  . Drug Use: No  . Sexually Active: Not on file   Other Topics Concern  . Not on file   Social History Narrative   Divorced x1, remarried.  Has 3 daughters from first marriage.   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  He admits he is a "workaholic".   Distant tobacco abuse (35 pack-yr hx), quit in the late 1990s, no ETOH or drugs.   No exercise.     Review of Systems: General: negative for chills, fever, night sweats or weight changes.  Cardiovascular: negative for chest pain, dyspnea on exertion, edema, orthopnea, palpitations, paroxysmal nocturnal dyspnea or shortness of breath Dermatological: negative for rash Respiratory: negative for cough or wheezing Urologic: negative for hematuria Abdominal: negative for nausea, vomiting, diarrhea, bright red blood per rectum, melena, or hematemesis Neurologic: negative for visual changes, syncope, or dizziness All other systems reviewed and are otherwise negative except as  noted above.    Blood pressure 120/70, pulse 76, height 6\' 2"  (1.88 m), weight 216 lb (97.977 kg).  General appearance: alert and no distress Neck: no adenopathy, no JVD, supple, symmetrical, trachea midline, thyroid not enlarged, symmetric, no tenderness/mass/nodules and mild bilateral internal carotid artery stenosis. Lungs: clear to auscultation bilaterally Heart: regular rate and rhythm and soft outflow tract murmur Extremities: extremities normal, atraumatic, no cyanosis or edema  EKG normal sinus rhythm at 76 with nonspecific ST and T wave changes  ASSESSMENT AND PLAN:   CAD (coronary artery disease) The patient has known 3 vessel disease by cardiac catheterization performed by Dr. Daphene Jaeger 10/28/11. He had 50-70% blockages in his dominant RCA, circumflex obtuse marginal branch and mid LAD with normal LV function. A Myoview performed just prior to that cath did show mild lateral ischemia. He denies chest pain or shortness of breath. He is on adequate antianginal medications.  Aortic stenosis, moderate Recent 2-D echo reveals normal LV function with an aortic valve area of approximately 1 cm suggesting mild progression of disease. His mean gradient is 23 with a peak of 38. We'll continue to follow this on an annual basis.  Hyperlipidemia On atorvastatin 20 with lipid profile followed by his primary care physician. Apparently has not ankle. He did experience side effects when he was on Lipitor 40 mg a day. I'm going to add Zetia 10 mg and we'll recheck a lipid and liver profile in 2 months.  Carotid stenosis, right Recent carotid Dopplers suggest moderate right ICA stenosis in the 50-70% range unchanged from his prior carotid Doppler study. He is on aspirin. He was neurologically asymptomatic. We'll continue to follow this on a semiannual basis.      Runell Gess MD FACP,FACC,FAHA, [facility] 06/21/2012 9:10 AM

## 2012-07-02 ENCOUNTER — Telehealth: Payer: Self-pay | Admitting: Family Medicine

## 2012-07-02 ENCOUNTER — Encounter: Payer: Self-pay | Admitting: Family Medicine

## 2012-07-02 NOTE — Telephone Encounter (Signed)
Pt states he needs his levothyroxine refill for 90 days Also, Pt concerned with memory loss.  Patient states that on Friday he had an episode where he even forgot what things were used for, such as lettuce.  He said that this episode lasted for about 1.5 hours.  Patient wondered if this could be associated with migraines?  Please advise what to tell patient.  He does not have an upcoming appt.

## 2012-07-03 MED ORDER — LEVOTHYROXINE SODIUM 137 MCG PO TABS: 137.0000 ug | ORAL_TABLET | Freq: Every day | ORAL | Status: DC

## 2012-07-03 NOTE — Telephone Encounter (Signed)
Levothyroxine RF'd x 90d.  Needs to keep September f/u appt so we can do routine TSH monitoring (this was last done about 7 mo ago). As far as the cognitive impairment he mentions, I suppose this could be a atypical symptom of migraine headache but this would be a stretch. Ask him to make sure he checks his glucose and blood pressure when this happens and also note any other symptoms that are occuring with these episodes (such as weakness in any area of his body, numbness or tingling anywhere, difficulty with speech or swallowing, palpitations, or headaches, etc.).--thx

## 2012-07-03 NOTE — Telephone Encounter (Signed)
Patient states that he does check his sugars and they're usually in the 150 range.  He does not check his BP.  I advised him to check BP if it occurs again.  Pt states that he gets "split vision" when this happens.   Only other symptom that patient notices is that it's hard to hold a conversation because he can't think of words to say.  Denies heart palpitations, body numbness, tingling or weakness.   I advised pt that if it occurs again and worries him to go to [facility] ER if he feels it's bad enough, so at least someone could see him and get an understanding of what is happening.  Please advise if you want him to come in or have any other recommendations.

## 2012-07-04 NOTE — Telephone Encounter (Signed)
Noted. No further recommendations.  

## 2012-07-05 NOTE — Telephone Encounter (Signed)
Left message for patient to follow my recommendations to report to ER if worse and to make sure to follow up with Dr. Milinda Cave in Sept.

## 2012-07-11 DIAGNOSIS — N400 Enlarged prostate without lower urinary tract symptoms: Secondary | ICD-10-CM | POA: Insufficient documentation

## 2012-07-11 DIAGNOSIS — N529 Male erectile dysfunction, unspecified: Secondary | ICD-10-CM | POA: Insufficient documentation

## 2012-07-11 DIAGNOSIS — F528 Other sexual dysfunction not due to a substance or known physiological condition: Secondary | ICD-10-CM | POA: Insufficient documentation

## 2012-07-12 ENCOUNTER — Other Ambulatory Visit: Payer: Self-pay

## 2012-07-18 ENCOUNTER — Other Ambulatory Visit: Payer: Self-pay | Admitting: Family Medicine

## 2012-07-18 NOTE — Telephone Encounter (Signed)
Refilled patients novolog for insulin pump x 6 refills per Dr Milinda Cave.

## 2012-07-25 ENCOUNTER — Telehealth: Payer: Self-pay | Admitting: Family Medicine

## 2012-07-25 NOTE — Telephone Encounter (Signed)
[facility]s call [facility] and ask how this is usually handled.  The patient got his sleep study through [facility] sleep lab 06/2011 as a split-night study and his setting was done at that time.  Can they do a home titration study now with the new nasal pillow?

## 2012-07-25 NOTE — Telephone Encounter (Signed)
Please advise 

## 2012-07-25 NOTE — Telephone Encounter (Signed)
Patient needs a new setting for his CPAP. He has switched to a nose pillow & the air is coming out too hard. He got his equipment through [facility].

## 2012-07-26 NOTE — Telephone Encounter (Signed)
I spoke with Melissa at [facility].  I ordered what she said to order, you just have to put in the pressure setting range and what kind of mask you want patient to have.  Please advise and then I will send Melissa a staff message for her to get this taken care of for Patient.

## 2012-08-15 ENCOUNTER — Telehealth: Payer: Self-pay | Admitting: Emergency Medicine

## 2012-08-15 NOTE — Telephone Encounter (Signed)
Patient left a VM for me stating that we need to rx a new pressure for his CPAP and fax it to [phone].  I thought we needed them to reevaluate this in order to decide what pressure to use.  Please advise.

## 2012-08-15 NOTE — Telephone Encounter (Signed)
Patient states he needs reduce pressure on his machine in the evenings and would like to speak to someone today as he has made several attempts,please advise pt.

## 2012-08-16 DIAGNOSIS — G4733 Obstructive sleep apnea (adult) (pediatric): Secondary | ICD-10-CM | POA: Insufficient documentation

## 2012-08-16 NOTE — Telephone Encounter (Signed)
Spoke to someone at [facility] and she stated to write a rx stating change pressure 4-20 for trial x 2 weeks with download and put our fax number on it so they will fax [place] results.  She stated that the results would basically state what pressure the patient needs to be on.

## 2012-08-16 NOTE — Telephone Encounter (Signed)
Done

## 2012-08-20 ENCOUNTER — Other Ambulatory Visit: Payer: Self-pay | Admitting: *Deleted

## 2012-08-20 MED ORDER — LISINOPRIL 2.5 MG PO TABS: 2.5000 mg | ORAL_TABLET | Freq: Every day | ORAL | Status: DC

## 2012-08-24 ENCOUNTER — Other Ambulatory Visit: Payer: Self-pay | Admitting: Family Medicine

## 2012-08-24 NOTE — Telephone Encounter (Signed)
Medication already refilled.

## 2012-08-27 ENCOUNTER — Other Ambulatory Visit: Payer: Self-pay | Admitting: Family Medicine

## 2012-08-27 MED ORDER — LISINOPRIL 2.5 MG PO TABS: 2.5000 mg | ORAL_TABLET | Freq: Every day | ORAL | Status: DC

## 2012-09-18 ENCOUNTER — Encounter: Payer: Self-pay | Admitting: Family Medicine

## 2012-09-18 ENCOUNTER — Ambulatory Visit (INDEPENDENT_AMBULATORY_CARE_PROVIDER_SITE_OTHER): Payer: PRIVATE HEALTH INSURANCE | Admitting: Family Medicine

## 2012-09-18 VITALS — BP 153/76 | HR 76 | Temp 98.2°F | Resp 16 | Ht 74.0 in | Wt 214.0 lb

## 2012-09-18 DIAGNOSIS — E1049 Type 1 diabetes mellitus with other diabetic neurological complication: Secondary | ICD-10-CM

## 2012-09-18 DIAGNOSIS — G909 Disorder of the autonomic nervous system, unspecified: Secondary | ICD-10-CM

## 2012-09-18 DIAGNOSIS — E785 Hyperlipidemia, unspecified: Secondary | ICD-10-CM

## 2012-09-18 DIAGNOSIS — E1042 Type 1 diabetes mellitus with diabetic polyneuropathy: Secondary | ICD-10-CM

## 2012-09-18 DIAGNOSIS — G3184 Mild cognitive impairment, so stated: Secondary | ICD-10-CM

## 2012-09-18 DIAGNOSIS — G4733 Obstructive sleep apnea (adult) (pediatric): Secondary | ICD-10-CM

## 2012-09-18 NOTE — Progress Notes (Signed)
OFFICE NOTE  09/18/2012  CC:  Chief Complaint  Patient presents with  . Diabetes  . Hypertension     HPI: Patient is a 62 y.o. Caucasian male who is here for 4 mo f/u DM, hyperlipidemia, hypothyroidism, OSA on CPAP. Saw Dr. Lafe Garin (Endo) for DM and got some pump adjustments, also has been adjusting diet quite a bit more than his usual plus he's been going to [employer] for workouts 3-5 times per week.  Compliance with his other meds has been an issue:  Not taking zetia for unclear reasons.  He associates his atorvastatin with memory and other cognitive problems and since d/c'ing this med 3 wks ago he has felt much better regarding these issues, says wife and coworkers have noticed as well. Has recently gotten home autotitration for 2 wks via [facility] (8/20-9/03, 2014), but has not received any info regarding recommendations from them about change in CPAP settings. Has stopped his lisinopril for unclear reason: he recalls being told by Dr. [name] to take this med in combo with his statin to try to help his cholesterol (?).   Has been taking "feverfew" for migraine prevention, although he denies any headaches.  He is interpreting some odd neurologic sx's as possibly migraine aura phenomenon: says he gets "brightening of lights in visual field, then memory issues/cognition problems such as "not knowing what coal is" for example.  NO HAs.  Pertinent PMH:  Past Medical History  Diagnosis Date  . Diabetes mellitus Dx'd age 17    Novolog via insulin pump; sub-optimal control x years  . High cholesterol     Takes 1/2 of 80mg  atorvastatin daily  . Hypothyroidism     Thyroidectomy 2002; multinodular goiter  . Erectile dysfunction     [place]  . Diabetic retinopathy     Proliferative: Hx of retinal detachment on right-light perception only in right eye  . History of tobacco abuse     Quit about 1990 (has 35 pack-yr hx)  . OSA (obstructive sleep apnea)     06/2011 sleep study: moderate OSA, CPAP at 12 cm H2O.  . Impaired vision     right eye light perception only; hx of retinal detachment.  . Recurrent pneumonia   . GERD (gastroesophageal reflux disease)   . Venous insufficiency   . Diastolic dysfunction 2007    TEE with diastolic dysfunction, EF 74%.  . Diabetic neuropathy     fine touch and position sense affected  . Aortic stenosis, mild Sept/Oct /2013    Mild/mod by echo; mild by cath 10/28/11.  Mild progression by echo 05/2012--f/u echo annually recommended (patient asymptomatic).  . Carotid stenosis, right 09/2011    50-70% stenosis, stable on repeat dopplers 05/2012--repeat 61mo  . 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  Med mgmt recommended.  . Abnormal stress test 10/27/2011    lat isch--cardiac cath 10/28/2011  . Venous reflux 10/14/2011    venous doppler-R GSV continuous reflux throughout; too small for VNUS closure  . Decreased pedal pulses     LE doppler 11/08/11- no evidence of arterial insufficiency   Past Surgical History  Procedure Laterality Date  . Thyroid surgery  2002  . Lasix eye    . Eye surgery      Multiple laser surgeries for diabetic retinopathy, also cataract surgery [facility].  [name] Retinal detachment surgery  Right eye  . Cataract extraction      left  . Transthoracic echocardiogram  10/2011    mild/mod aortic stenosis--being followed by Dr. [name]  . Cardiac catheterization  10/2011    EF normal.  Diffuse 3 vessel CAD, no stents placed.  Medical mgmt per [facility].   Past family and social history reviewed and there are no changes since the patient's last office visit with me.  MEDS:  Outpatient Prescriptions Prior to Visit  Medication Sig Dispense Refill  . aspirin EC 81 MG tablet Take 81 mg by mouth daily.      . clotrimazole-betamethasone (LOTRISONE) cream Apply to affected area twice daily as  needed for rash  15 g  2  . Coenzyme Q10 (CO Q 10) 100 MG CAPS Take 1 capsule by mouth daily.      . Insulin Human (INSULIN PUMP) 100 unit/ml SOLN Inject 1 each into the skin continuous. Novolog in Pump      . levothyroxine (SYNTHROID, LEVOTHROID) 137 MCG tablet Take 1 tablet (137 mcg total) by mouth daily.  90 tablet  0  . Omega-3 Fatty Acids (FISH OIL) 500 MG CAPS Take 1 capsule by mouth daily.      . tadalafil (CIALIS) 20 MG tablet Take 20 mg by mouth daily as needed. For sexual activity      . atorvastatin (LIPITOR) 40 MG tablet Take 0.5 tablets (20 mg total) by mouth daily.  30 tablet  2  . ezetimibe (ZETIA) 10 MG tablet Take 1 tablet (10 mg total) by mouth daily.  90 tablet  3  . glucagon (GLUCAGON EMERGENCY) 1 MG injection Inject 1 mg into the muscle once as needed.  2 each  prn  . lisinopril (PRINIVIL,ZESTRIL) 2.5 MG tablet Take 1 tablet (2.5 mg total) by mouth daily.  30 tablet  2   No facility-administered medications prior to visit.    PE: Blood pressure 153/76, pulse 76, temperature 98.2 F (36.8 C), temperature source Temporal, resp. rate 16, height 6\' 2"  (1.88 m), weight 214 lb (97.07 kg), SpO2 96.00%. Gen: Alert, well appearing.  Patient is oriented to person, place, time, and situation. No further exam today.  IMPRESSION AND PLAN:  1) DM 1 with insulin pump: pt doing better with compliance with bolusing at meals, also better with diabetic diet and regular exercise. He has plan to f/u with Dr. Lafe Garin, although he admits he has not called to set this appt up yet.   No labs today for this: will plan on him getting all routine diabetic care and monitoring through endo MD.  2) Hyperlipidemia: question of cognitive impairment from statin.  Short term trial off med so far is encouraging. He'll continue off med for now.  He seems resistant to addressing his hyperlipidemia with meds at this time. Fortunately, diet and exercise have improved.    3) Brief periods of mild cognitive  impairment: question of med related (statin).  I encouraged a neurologist's evaluation for these periods to help r/o partial seizures but he cites financial constraints and feels like he wants to simply continue off of his statin and continue "feverfew" and see how this goes.  4) OSA: will contact his CPAP supplier who recently did home auto-titrate and see if anything is needed from me to expedite a change in his settings.   Not fasting today: no labs done.  Pt declined flu vaccine today.  Spent 25 min with pt today, with >50% of this time spent in  counseling and care coordination regarding the above problems.  FOLLOW UP: 4 mo

## 2012-10-05 ENCOUNTER — Encounter: Payer: Self-pay | Admitting: Family Medicine

## 2012-10-08 ENCOUNTER — Other Ambulatory Visit: Payer: Self-pay | Admitting: Family Medicine

## 2012-10-08 MED ORDER — LEVOTHYROXINE SODIUM 137 MCG PO TABS: 137.0000 ug | ORAL_TABLET | Freq: Every day | ORAL | Status: DC

## 2012-11-08 ENCOUNTER — Other Ambulatory Visit: Payer: Self-pay

## 2012-11-20 ENCOUNTER — Telehealth (HOSPITAL_COMMUNITY): Payer: Self-pay | Admitting: *Deleted

## 2012-12-06 ENCOUNTER — Ambulatory Visit (HOSPITAL_COMMUNITY)
Admission: RE | Admit: 2012-12-06 | Discharge: 2012-12-06 | Disposition: A | Discharge status: Home / Self Care | Payer: PRIVATE HEALTH INSURANCE | Source: Ambulatory Visit | Attending: Cardiovascular Disease | Admitting: Cardiovascular Disease

## 2012-12-06 DIAGNOSIS — I2510 Atherosclerotic heart disease of native coronary artery without angina pectoris: Secondary | ICD-10-CM | POA: Insufficient documentation

## 2012-12-06 DIAGNOSIS — I359 Nonrheumatic aortic valve disorder, unspecified: Secondary | ICD-10-CM | POA: Insufficient documentation

## 2012-12-06 DIAGNOSIS — E785 Hyperlipidemia, unspecified: Secondary | ICD-10-CM | POA: Insufficient documentation

## 2012-12-06 DIAGNOSIS — I350 Nonrheumatic aortic (valve) stenosis: Secondary | ICD-10-CM

## 2012-12-06 NOTE — Progress Notes (Signed)
2D Echo Performed 12/06/2012    Yoshimi Sarr, RCS  

## 2012-12-10 ENCOUNTER — Telehealth: Payer: Self-pay | Admitting: *Deleted

## 2012-12-10 ENCOUNTER — Encounter: Payer: Self-pay | Admitting: *Deleted

## 2012-12-10 DIAGNOSIS — I359 Nonrheumatic aortic valve disorder, unspecified: Secondary | ICD-10-CM

## 2012-12-10 NOTE — Telephone Encounter (Signed)
Order placed for repeat echocardiogram in 1 year 

## 2012-12-10 NOTE — Telephone Encounter (Signed)
Message copied by Marella Bile on Mon Dec 10, 2012  4:32 PM ------      Message from: Runell Gess      Created: Sun Dec 09, 2012  7:24 PM       No change from prior study. Repeat in 12 months. ------

## 2012-12-18 LAB — HM DIABETES EYE EXAM

## 2012-12-21 ENCOUNTER — Encounter (HOSPITAL_COMMUNITY): Payer: PRIVATE HEALTH INSURANCE

## 2013-01-07 ENCOUNTER — Ambulatory Visit: Payer: PRIVATE HEALTH INSURANCE | Admitting: Internal Medicine

## 2013-01-08 ENCOUNTER — Ambulatory Visit: Payer: PRIVATE HEALTH INSURANCE | Admitting: Internal Medicine

## 2013-01-11 ENCOUNTER — Ambulatory Visit (HOSPITAL_COMMUNITY)
Admission: RE | Admit: 2013-01-11 | Discharge: 2013-01-11 | Disposition: A | Discharge status: Home / Self Care | Payer: PRIVATE HEALTH INSURANCE | Source: Ambulatory Visit | Attending: Cardiovascular Disease | Admitting: Cardiovascular Disease

## 2013-01-11 DIAGNOSIS — I6529 Occlusion and stenosis of unspecified carotid artery: Secondary | ICD-10-CM

## 2013-01-11 DIAGNOSIS — I672 Cerebral atherosclerosis: Secondary | ICD-10-CM | POA: Insufficient documentation

## 2013-01-11 DIAGNOSIS — I6521 Occlusion and stenosis of right carotid artery: Secondary | ICD-10-CM

## 2013-01-11 NOTE — Progress Notes (Signed)
Carotid duplex completed. GMG 

## 2013-01-15 ENCOUNTER — Ambulatory Visit (INDEPENDENT_AMBULATORY_CARE_PROVIDER_SITE_OTHER): Payer: PRIVATE HEALTH INSURANCE | Admitting: Internal Medicine

## 2013-01-15 ENCOUNTER — Encounter: Payer: Self-pay | Admitting: Internal Medicine

## 2013-01-15 VITALS — BP 118/66 | HR 81 | Temp 97.8°F | Resp 12 | Wt 216.0 lb

## 2013-01-15 DIAGNOSIS — E039 Hypothyroidism, unspecified: Secondary | ICD-10-CM

## 2013-01-15 DIAGNOSIS — E1049 Type 1 diabetes mellitus with other diabetic neurological complication: Secondary | ICD-10-CM

## 2013-01-15 DIAGNOSIS — E1042 Type 1 diabetes mellitus with diabetic polyneuropathy: Secondary | ICD-10-CM

## 2013-01-15 DIAGNOSIS — E1142 Type 2 diabetes mellitus with diabetic polyneuropathy: Secondary | ICD-10-CM

## 2013-01-15 LAB — TSH: TSH: 3.16 u[IU]/mL (ref 0.35–5.50)

## 2013-01-15 LAB — HEMOGLOBIN A1C: Hgb A1c MFr Bld: 9.6 % — ABNORMAL HIGH (ref 4.6–6.5)

## 2013-01-15 MED ORDER — INSULIN PUMP: 1.0000 | SUBCUTANEOUS | Status: DC

## 2013-01-15 NOTE — Progress Notes (Signed)
Patient ID: [name], male   DOB: 01/08/1950, 63 y.o.   MRN: [id number]  HPI: [name] is a 63 y.o.-year-old male, returning for f/u for DM1, dx 80 (63 y/o), uncontrolled, with complications (CAD, proliferative diabetic retinopathy-history of retinal detachment OD, right carotid stenosis, PVD, peripheral neuropathy, ED). He returns after an absence of 8 months.   Last hemoglobin A1c was: Lab Results  Component Value Date   HGBA1C 10.0* 05/08/2012   HGBA1C 9.4* 04/21/2011   HGBA1C 9.5* 01/12/2011   Pt is on an insulin pump: One Touch Ping ([employer]) with NovoLog. He recently got a new pump.  Pump settings: - basal rate:  MN 0.8 U/h 2 am: 0.85 7 am: 0.8 - ICR: 14 - ISF: 30 - target: pre-meal 70-110; postmeal 90-140: MN: 140 5:30: 120 22: 150 - IOB: 4 h  - primes ~q3 days - boluses 1-4x a day - TDD from boluses: 7+7+8 = 22 units He gets approximately 45 TDD. He boluses after he eats! Despite advice to bolus 15 min before.   Pt checks his sugars 4 a day and they are: - am: 150-250 >> 110-250s (yesterday: 55) - before lunch: 180-275 >>  200s - before dinner: 180-275 >> 200s - bedtime: can go to 300s >> 300-400s No lows. Lowest sugar was 50s in am; he has hypoglycemia awareness at 100. He had few 400s (sometimes b/c insertion site pbs). Highest 508 x 1.   Pt's meals are: - Breakfast: 1.5 scramble eggs, toast, milk - 65 g carbs - Lunch: sandwich, shaved chicken or [place] breast, cheese, mayo, chips, soda - 100 carbs - Dinner: meat, vegetables, bread, fruit, milk - 100 g carbs - Snacks: 2-3 a day: bagel midmorning, evening snack: chips, popcorn  Pt does not have chronic kidney disease, last BUN/creatinine was:  Lab Results  Component Value Date   BUN 13 05/08/2012   CREATININE 1.0 05/08/2012  Last microalbumin/creatinine ratio was 3.8.  Last set of lipids: Lab Results  Component Value Date   CHOL 200 05/08/2012   HDL 65.40 05/08/2012   LDLCALC 120* 05/08/2012   LDLDIRECT  159.5 04/21/2011   TRIG 71.0 05/08/2012   CHOLHDL 3 05/08/2012   Pt's last eye exam was in 12/2012. + DR - OD, s/p detached retina, but stable. Has numbness and tingling in his legs.  He also has a history of hyperlipidemia, disequilibrium, mild cognitive impairment, hypothyroidism-status post thyroidectomy for multinodular goiter, OSA-on CPAP, GERD.  I reviewed pt's medications, allergies, PMH, social hx, family hx and no changes required, except as mentioned above and also: "I stopped all heart and cholesterol medicines".   ROS: Constitutional: no weight gain, no fatigue, no subjective hyperthermia/hypothermia Eyes: no blurry vision, no xerophthalmia ENT: no sore throat, no nodules palpated in throat, no dysphagia/odynophagia, no hoarseness, + tinnitus Cardiovascular: no CP/SOB/palpitations/+ leg swelling - wears compression hoses Respiratory: no cough/SOB Gastrointestinal: no N/V/D/C Musculoskeletal: no muscle aches/+ joint aches Skin: no rashes Neurological: no tremors/numbness/tingling/dizziness Diff. w/ erections  PE: BP 118/66  Pulse 81  Temp(Src) 97.8 F (36.6 C) (Oral)  Resp 12  Wt 216 lb (97.977 kg)  SpO2 96% Wt Readings from Last 3 Encounters:  01/15/13 216 lb (97.977 kg)  09/18/12 214 lb (97.07 kg)  06/21/12 216 lb (97.977 kg)  Body mass index is 27.72 kg/(m^2).  Constitutional: slightly overweight, in NAD Eyes: PERRLA, EOMI, no exophthalmos ENT: moist mucous membranes, no thyromegaly, no cervical lymphadenopathy Cardiovascular: RRR, No MRG Respiratory: CTA B Gastrointestinal: abdomen soft, NT,  ND, BS+ Musculoskeletal: no deformities, strength intact in all 4 Skin: moist, warm, no rashes  ASSESSMENT: 1. DM1, uncontrolled, with complications - CAD, last 2-D echo 05/31/2012: EF 60-65%, mild LVF, moderate aortic stenosis, no LVWMA - proliferative diabetic retinopathy-history of retinal detachment OD - right carotid stenosis - had carotid duplex study  05/31/2012 - PVD - peripheral neuropathy - ED  PLAN:  1.  Patient with long-standing type 1 diabetes, poorly controlled, with multiple complications, on an insulin pump, however without good understanding of the pump principles and settings. Sugars are  high throughout the day, higher as the day goes on. At last visit, he had some low CBGs at night, not anymore. - he gets ~ the same total daily dose of insulin from bolusing as from basal, so he is not necessarily underbolusing - at last visit I advised him to move his bolusing 15 minutes before he starts eating, but he did not do this... He is bolusing after he finished eating, especially since he has slow boluses, I believe he was missing the after-meal CBG peaks.  - I advised him to change the following; Patient Instructions  Pump settings: - basal rate:  MN 0.8 U/h 2 am: 0.85 7 am: 0.8 >> 0.85 11 am: 0.85 - ICR: 14  - ISF: 30 >> MN: 30  11 am: 25 - target:  MN: 140 5:30: 120 22: 150 - IOB: 4 h  Please bolus for all your main meals - please bolus 15 min before you eat.  - I referred him to diabetes education - will check A1c and TSH today - I will see him in 1 mo  Office Visit on 01/15/2013  Component Date Value Range Status  . Hemoglobin A1C 01/15/2013 9.6* 4.6 - 6.5 % Final   Glycemic Control Guidelines for People with Diabetes:Non Diabetic:  <6%Goal of Therapy: <7%Additional Action Suggested:  >8%   . TSH 01/15/2013 3.16  0.35 - 5.50 uIU/mL Final  . HM Diabetic Eye Exam 12/18/2012 DR, stable - [facility] (Dr Marvis Repressraig Greven)   Final   Will let pt know.

## 2013-01-15 NOTE — Patient Instructions (Addendum)
Please return in 1 month with your sugar log.   Pump settings: - basal rate:  MN 0.8 U/h 2 am: 0.85 7 am: 0.8 >> 0.85 11 am: 0.85 - ICR: 14  - ISF: 30 >> MN: 30  11 am: 25 - target:  MN: 140 5:30: 120 22: 150 - IOB: 4 h   Please bolus for all your main meals - please bolus 15 min before you eat.

## 2013-01-16 ENCOUNTER — Telehealth: Payer: Self-pay | Admitting: Cardiovascular Disease

## 2013-01-16 ENCOUNTER — Telehealth: Payer: Self-pay | Admitting: *Deleted

## 2013-01-16 DIAGNOSIS — I6529 Occlusion and stenosis of unspecified carotid artery: Secondary | ICD-10-CM

## 2013-01-16 NOTE — Telephone Encounter (Signed)
Order placed for repeat carotid dopplers in 6 months  

## 2013-01-16 NOTE — Telephone Encounter (Signed)
Message copied by Marella BileVOGEL, Kentrel Clevenger W. on Wed Jan 16, 2013  1:00 PM ------      Message from: Runell GessBERRY, JONATHAN J      Created: Wed Jan 16, 2013  9:51 AM       Mild increase in LICA velocities. Repeat in 6 months ------

## 2013-01-16 NOTE — Telephone Encounter (Signed)
I spoke with patient and reviewed results.

## 2013-01-16 NOTE — Telephone Encounter (Signed)
Returning your call concerning his test results. °

## 2013-01-17 ENCOUNTER — Encounter: Payer: Self-pay | Admitting: Family Medicine

## 2013-01-18 ENCOUNTER — Ambulatory Visit (INDEPENDENT_AMBULATORY_CARE_PROVIDER_SITE_OTHER): Payer: PRIVATE HEALTH INSURANCE | Admitting: Family Medicine

## 2013-01-18 ENCOUNTER — Encounter: Payer: Self-pay | Admitting: Family Medicine

## 2013-01-18 VITALS — BP 145/83 | HR 79 | Temp 97.8°F | Ht 74.0 in | Wt 218.0 lb

## 2013-01-18 DIAGNOSIS — G3184 Mild cognitive impairment, so stated: Secondary | ICD-10-CM

## 2013-01-18 DIAGNOSIS — M76899 Other specified enthesopathies of unspecified lower limb, excluding foot: Secondary | ICD-10-CM

## 2013-01-18 DIAGNOSIS — M7062 Trochanteric bursitis, left hip: Secondary | ICD-10-CM | POA: Insufficient documentation

## 2013-01-18 DIAGNOSIS — Z23 Encounter for immunization: Secondary | ICD-10-CM

## 2013-01-18 DIAGNOSIS — E785 Hyperlipidemia, unspecified: Secondary | ICD-10-CM

## 2013-01-18 DIAGNOSIS — Z0000 Encounter for general adult medical examination without abnormal findings: Secondary | ICD-10-CM

## 2013-01-18 NOTE — Assessment & Plan Note (Signed)
Prevnar 13 given IM today. He declined flu once again today.

## 2013-01-18 NOTE — Progress Notes (Signed)
Pre-visit discussion using our clinic review tool. No additional management support is needed unless otherwise documented below in the visit note.  

## 2013-01-18 NOTE — Assessment & Plan Note (Signed)
Reassured. Relative rest, stretches handout given today to pt.

## 2013-01-18 NOTE — Progress Notes (Signed)
OFFICE NOTE  01/18/2013  CC:  Chief Complaint  Patient presents with  . Follow-up    4 month     HPI: Patient is a 63 y.o. Caucasian male who is here for 4 mo f/u hypothyroidism, hx of mild cognitive impairment, essentially multiple chronic med issues.  He does see an endocrinologist and a cardiologist. Has appt with nutritionist soon.  Says he has some new year's resolutions regarding TLC, esp given recent A1c and mild worsening in carotid dopplers. Wearing CPAP now, not real thrilled with it.  It does help him sleep.  Building a fish pond lately, lifting and digging a lot.  Feels pain in left lateral hip some.  Walking for a while helps it.  It hurts to lie on it and it hurts after prolonged sitting.  Pertinent PMH:  Past Medical History  Diagnosis Date  . Diabetes mellitus Dx'd age 19    Novolog via insulin pump; sub-optimal control x years  . High cholesterol     Takes 1/2 of 80mg  atorvastatin daily  . Hypothyroidism     Thyroidectomy 2002; multinodular goiter  . Erectile dysfunction     [place]  . Diabetic retinopathy     Proliferative: Hx of retinal detachment on right-light perception only in right eye  . History of tobacco abuse     Quit about 1990 (has 35 pack-yr hx)  . OSA (obstructive sleep apnea)     06/2011 sleep study: moderate OSA, CPAP at 12 cm H2O.  . Impaired vision     right eye light perception only; hx of retinal detachment.  . Recurrent pneumonia   . GERD (gastroesophageal reflux disease)   . Venous insufficiency   . Diastolic dysfunction 2007    TEE with diastolic dysfunction, EF 74%.  . Diabetic neuropathy     fine touch and position sense affected  . Aortic stenosis, mild Sept/Oct /2013    Mild/mod by echo; mild by cath 10/28/11.  Mild progression by echo 05/2012--f/u echo annually recommended (patient asymptomatic).  . Carotid stenosis, right 09/2011    50-70% stenosis, stable on repeat dopplers 05/2012 but left sided velocities  increased 01/2013. Dr. [name] suggests f/u doppler in 6 mo.  . 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  Med mgmt recommended.  . Abnormal stress test 10/27/2011    lat isch--cardiac cath 10/28/2011  . Venous reflux 10/14/2011    venous doppler-R GSV continuous reflux throughout; too small for VNUS closure  . Decreased pedal pulses     LE doppler 11/08/11- no evidence of arterial insufficiency   Past Surgical History  Procedure Laterality Date  . Thyroid surgery  2002  . Lasix eye    . Eye surgery      Multiple laser surgeries for diabetic retinopathy, also cataract surgery [facility].  [name] Retinal detachment surgery      Right eye  . Cataract extraction      left  . Transthoracic echocardiogram  10/2011;12/2012    Mod AS, mild LVH, EF 60-65%, no RWMA-being followed by Dr. [name]  . Cardiac catheterization  10/2011    EF normal.  Diffuse 3 vessel CAD, no stents placed.  Medical mgmt per [facility].    MEDS:  Outpatient Prescriptions Prior to Visit  Medication Sig Dispense Refill  . aspirin EC 81 MG tablet Take 81 mg by mouth daily.      [name]  clotrimazole-betamethasone (LOTRISONE) cream Apply to affected area twice daily as needed for rash  15 g  2  . glucagon (GLUCAGON EMERGENCY) 1 MG injection Inject 1 mg into the muscle once as needed.  2 each  prn  . Insulin Human (INSULIN PUMP) 100 unit/ml SOLN Inject 1 each into the skin continuous. Novolog - ~50 units a day.  60 each  3  . levothyroxine (SYNTHROID, LEVOTHROID) 137 MCG tablet Take 1 tablet (137 mcg total) by mouth daily.  90 tablet  1  . Omega-3 Fatty Acids (FISH OIL) 500 MG CAPS Take 1 capsule by mouth daily.      . tadalafil (CIALIS) 20 MG tablet Take 20 mg by mouth daily as needed. For sexual activity      . atorvastatin (LIPITOR) 40 MG tablet Take 0.5 tablets (20 mg total) by mouth daily.  30 tablet  2  . Coenzyme Q10 (CO Q 10) 100 MG  CAPS Take 1 capsule by mouth daily.      [name]. ezetimibe (ZETIA) 10 MG tablet Take 1 tablet (10 mg total) by mouth daily.  90 tablet  3  . lisinopril (PRINIVIL,ZESTRIL) 2.5 MG tablet Take 1 tablet (2.5 mg total) by mouth daily.  30 tablet  2   No facility-administered medications prior to visit.    PE: Blood pressure 145/83, pulse 79, temperature 97.8 F (36.6 C), temperature source Temporal, height 6\' 2"  (1.88 m), weight 218 lb (98.884 kg), SpO2 95.00%. Gen: Alert, well appearing.  Patient is oriented to person, place, time, and situation. AFFECT: pleasant, lucid thought and speech. LB and glut regions nontender.  ROM intact in L-spine. He has point tenderness over left greater trochanter region.  Mild pain with extreme of left hip external rotation. No pain in hip joint with active or passive ROM. LE strength 5/5 prox/dist bilat.  IMPRESSION AND PLAN:  Trochanteric bursitis of left hip Reassured. Relative rest, stretches handout given today to pt.  Preventative health care Prevnar 13 given IM today. He declined flu once again today.  Hyperlipidemia He will be trying harder with TLC over the next 57mo so we'll recheck chol at f/u in 57mo. He has been intolerant of statins and zetia historically.  Says he feels much better cognitively OFF of these meds.   An After Visit Summary was printed and given to the patient.  FOLLOW UP: 57mo

## 2013-01-18 NOTE — Assessment & Plan Note (Signed)
He will be trying harder with TLC over the next 77mo so we'll recheck chol at f/u in 77mo. He has been intolerant of statins and zetia historically.  Says he feels much better cognitively OFF of these meds.

## 2013-01-27 ENCOUNTER — Emergency Department (HOSPITAL_BASED_OUTPATIENT_CLINIC_OR_DEPARTMENT_OTHER)
Admission: EM | Admit: 2013-01-27 | Discharge: 2013-01-27 | Disposition: A | Discharge status: Home / Self Care | Payer: PRIVATE HEALTH INSURANCE | Attending: Emergency Medicine | Admitting: Emergency Medicine

## 2013-01-27 ENCOUNTER — Encounter (HOSPITAL_BASED_OUTPATIENT_CLINIC_OR_DEPARTMENT_OTHER): Payer: Self-pay | Admitting: Emergency Medicine

## 2013-01-27 ENCOUNTER — Emergency Department (HOSPITAL_BASED_OUTPATIENT_CLINIC_OR_DEPARTMENT_OTHER): Payer: PRIVATE HEALTH INSURANCE

## 2013-01-27 DIAGNOSIS — Z79899 Other long term (current) drug therapy: Secondary | ICD-10-CM | POA: Insufficient documentation

## 2013-01-27 DIAGNOSIS — Z8701 Personal history of pneumonia (recurrent): Secondary | ICD-10-CM | POA: Insufficient documentation

## 2013-01-27 DIAGNOSIS — Z87448 Personal history of other diseases of urinary system: Secondary | ICD-10-CM | POA: Insufficient documentation

## 2013-01-27 DIAGNOSIS — Z794 Long term (current) use of insulin: Secondary | ICD-10-CM | POA: Insufficient documentation

## 2013-01-27 DIAGNOSIS — I2510 Atherosclerotic heart disease of native coronary artery without angina pectoris: Secondary | ICD-10-CM | POA: Insufficient documentation

## 2013-01-27 DIAGNOSIS — E1139 Type 2 diabetes mellitus with other diabetic ophthalmic complication: Secondary | ICD-10-CM | POA: Insufficient documentation

## 2013-01-27 DIAGNOSIS — E1142 Type 2 diabetes mellitus with diabetic polyneuropathy: Secondary | ICD-10-CM | POA: Insufficient documentation

## 2013-01-27 DIAGNOSIS — S6000XA Contusion of unspecified finger without damage to nail, initial encounter: Secondary | ICD-10-CM | POA: Insufficient documentation

## 2013-01-27 DIAGNOSIS — Z8719 Personal history of other diseases of the digestive system: Secondary | ICD-10-CM | POA: Insufficient documentation

## 2013-01-27 DIAGNOSIS — G4733 Obstructive sleep apnea (adult) (pediatric): Secondary | ICD-10-CM | POA: Insufficient documentation

## 2013-01-27 DIAGNOSIS — Z87891 Personal history of nicotine dependence: Secondary | ICD-10-CM | POA: Insufficient documentation

## 2013-01-27 DIAGNOSIS — E11319 Type 2 diabetes mellitus with unspecified diabetic retinopathy without macular edema: Secondary | ICD-10-CM | POA: Insufficient documentation

## 2013-01-27 DIAGNOSIS — Z23 Encounter for immunization: Secondary | ICD-10-CM | POA: Insufficient documentation

## 2013-01-27 DIAGNOSIS — Y939 Activity, unspecified: Secondary | ICD-10-CM | POA: Insufficient documentation

## 2013-01-27 DIAGNOSIS — Z7982 Long term (current) use of aspirin: Secondary | ICD-10-CM | POA: Insufficient documentation

## 2013-01-27 DIAGNOSIS — Z9981 Dependence on supplemental oxygen: Secondary | ICD-10-CM | POA: Insufficient documentation

## 2013-01-27 DIAGNOSIS — S60029A Contusion of unspecified index finger without damage to nail, initial encounter: Secondary | ICD-10-CM

## 2013-01-27 DIAGNOSIS — E039 Hypothyroidism, unspecified: Secondary | ICD-10-CM | POA: Insufficient documentation

## 2013-01-27 DIAGNOSIS — Z95818 Presence of other cardiac implants and grafts: Secondary | ICD-10-CM | POA: Insufficient documentation

## 2013-01-27 DIAGNOSIS — Y929 Unspecified place or not applicable: Secondary | ICD-10-CM | POA: Insufficient documentation

## 2013-01-27 DIAGNOSIS — W230XXA Caught, crushed, jammed, or pinched between moving objects, initial encounter: Secondary | ICD-10-CM | POA: Insufficient documentation

## 2013-01-27 DIAGNOSIS — E1149 Type 2 diabetes mellitus with other diabetic neurological complication: Secondary | ICD-10-CM | POA: Insufficient documentation

## 2013-01-27 MED ORDER — TETANUS-DIPHTH-ACELL PERTUSSIS 5-2.5-18.5 LF-MCG/0.5 IM SUSP
0.5000 mL | Freq: Once | INTRAMUSCULAR | Status: AC
  Administered 2013-01-27: 0.5 mL via INTRAMUSCULAR
  Filled 2013-01-27: qty 0.5

## 2013-01-27 MED ORDER — HYDROCODONE-ACETAMINOPHEN 5-325 MG PO TABS: 1.0000 | ORAL_TABLET | ORAL | Status: DC | PRN

## 2013-01-27 NOTE — Discharge Instructions (Signed)
Contusion °A contusion is a deep bruise. Contusions are the result of an injury that caused bleeding under the skin. The contusion may turn blue, purple, or yellow. Minor injuries will give you a painless contusion, but more severe contusions may stay painful and swollen for a few weeks.  °CAUSES  °A contusion is usually caused by a blow, trauma, or direct force to an area of the body. °SYMPTOMS  °· Swelling and redness of the injured area. °· Bruising of the injured area. °· Tenderness and soreness of the injured area. °· Pain. °DIAGNOSIS  °The diagnosis can be made by taking a history and physical exam. An X-ray, CT scan, or MRI may be needed to determine if there were any associated injuries, such as fractures. °TREATMENT  °Specific treatment will depend on what area of the body was injured. In general, the best treatment for a contusion is resting, icing, elevating, and applying cold compresses to the injured area. Over-the-counter medicines may also be recommended for pain control. Ask your caregiver what the best treatment is for your contusion. °HOME CARE INSTRUCTIONS  °· Put ice on the injured area. °· Put ice in a plastic bag. °· Place a towel between your skin and the bag. °· Leave the ice on for 15-20 minutes, 03-04 times a day. °· Only take over-the-counter or prescription medicines for pain, discomfort, or fever as directed by your caregiver. Your caregiver may recommend avoiding anti-inflammatory medicines (aspirin, ibuprofen, and naproxen) for 48 hours because these medicines may increase bruising. °· Rest the injured area. °· If possible, elevate the injured area to reduce swelling. °SEEK IMMEDIATE MEDICAL CARE IF:  °· You have increased bruising or swelling. °· You have pain that is getting worse. °· Your swelling or pain is not relieved with medicines. °MAKE SURE YOU:  °· Understand these instructions. °· Will watch your condition. °· Will get help right away if you are not doing well or get  worse. °Document Released: 09/29/2004 Document Revised: 03/14/2011 Document Reviewed: 10/25/2010 °ExitCare® Patient Information ©2014 [employer]. ° °Fingertip Injuries and Amputations °Fingertip injuries are common and often get injured because they are last to escape when pulling your hand out of harm's way. You have amputated (cut off) part of your finger. How this turns out depends largely on how much was amputated. If just the tip is amputated, often the end of the finger will grow back and the finger may return to much the same as it was before the injury.  °If more of the finger is missing, your caregiver has done the best with the tissue remaining to allow you to keep as much finger as is possible. Your caregiver after checking your injury has tried to leave you with a painless fingertip that has durable, feeling skin. If possible, your caregiver has tried to maintain the finger's length and appearance and preserve its fingernail.  °Please read the instructions outlined below and refer to this sheet in the next few weeks. These instructions provide you with general information on caring for yourself. Your caregiver may also give you specific instructions. While your treatment has been done according to the most current medical practices available, unavoidable complications occasionally occur. If you have any problems or questions after discharge, please call your caregiver. °HOME CARE INSTRUCTIONS  °· You may resume normal diet and activities as directed or allowed. °· Keep your hand elevated above the level of your heart. This helps decrease pain and swelling. °· Keep ice packs (or a   bag of ice wrapped in a towel) on the injured area for 15-20 minutes, 03-04 times per day, for the first two days. °· Change dressings if necessary or as directed. °· Clean the wound daily or as directed. °· Only take over-the-counter or prescription medicines for pain, discomfort, or fever as directed by your  caregiver. °· Keep appointments as directed. °SEEK IMMEDIATE MEDICAL CARE IF: °· You develop redness, swelling, numbness or increasing pain in the wound. °· There is pus coming from the wound. °· You develop an unexplained oral temperature above 102° F (38.9° C) or as your caregiver suggests. °· There is a foul (bad) smell coming from the wound or dressing. °· There is a breaking open of the wound (edges not staying together) after sutures or staples have been removed. °MAKE SURE YOU:  °· Understand these instructions. °· Will watch your condition. °· Will get help right away if you are not doing well or get worse. °Document Released: 11/10/2004 Document Revised: 03/14/2011 Document Reviewed: 10/10/2007 °ExitCare® Patient Information ©2014 [employer]. ° °

## 2013-01-27 NOTE — ED Provider Notes (Signed)
Medical screening examination/treatment/procedure(s) were performed by non-physician practitioner and as supervising physician I was immediately available for consultation/collaboration.  EKG Interpretation   None         Shelda JakesScott W. Annabel Gibeau, MD 01/27/13 [phone]

## 2013-01-27 NOTE — ED Notes (Signed)
Left pointer finger slammed in a car door. Bleeding controlled at this time.

## 2013-01-27 NOTE — ED Provider Notes (Signed)
CSN: [id number]     Arrival date & time 01/27/13  1754 History   First MD Initiated Contact with Patient 01/27/13 1805     Chief Complaint  Patient presents with  . Finger Injury   (Consider location/radiation/quality/duration/timing/severity/associated sxs/prior Treatment) HPI Comments: Pt is a 63 y/o male who presents to the ED complaining of left index finger pain after slamming it in a car door. Pt states he slammed the tip of his finger and it started bleeding and throbbing, now starting to feel numb. No swelling but some bruising. Unsure of last tetanus shot.  The history is provided by the patient and the spouse.    Past Medical History  Diagnosis Date  . Diabetes mellitus Dx'd age 63    Novolog via insulin pump; sub-optimal control x years  . High cholesterol     Takes 1/2 of 80mg  atorvastatin daily  . Hypothyroidism     Thyroidectomy 2002; multinodular goiter  . Erectile dysfunction     Se[place]rologist [place]  . Diabetic retinopathy     Proliferative: Hx of retinal detachment on right-light perception only in right eye  . History of tobacco abuse     Quit about 1990 (has 35 pack-yr hx)  . OSA (obstructive sleep apnea)     06/2011 sleep study: moderate OSA, CPAP at 12 cm H2O.  . Impaired vision     right eye light perception only; hx of retinal detachment.  . Recurrent pneumonia   . GERD (gastroesophageal reflux disease)   . Venous insufficiency   . Diastolic dysfunction 2007    TEE with diastolic dysfunction, EF 74%.  . Diabetic neuropathy     fine touch and position sense affected  . Aortic stenosis, mild Sept/Oct /2013    Mild/mod by echo; mild by cath 10/28/11.  Mild progression by echo 05/2012--f/u echo annually recommended (patient asymptomatic).  . Carotid stenosis, right 09/2011    50-70% stenosis, stable on repeat dopplers 05/2012 but left sided velocities increased 01/2013. Dr. [name] suggests f/u doppler in 6 mo.  . 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  Med mgmt recommended.  . Abnormal stress test 10/27/2011    lat isch--cardiac cath 10/28/2011  . Venous reflux 10/14/2011    venous doppler-R GSV continuous reflux throughout; too small for VNUS closure  . Decreased pedal pulses     LE doppler 11/08/11- no evidence of arterial insufficiency   Past Surgical History  Procedure Laterality Date  . Thyroid surgery  2002  . Lasix eye    . Eye surgery      Multiple laser surgeries for diabetic retinopathy, also cataract surgery [facility].  [name] Retinal detachment surgery      Right eye  . Cataract extraction      left  . Transthoracic echocardiogram  10/2011;12/2012    Mod AS, mild LVH, EF 60-65%, no RWMA-being followed by Dr. [name]  . Cardiac catheterization  10/2011    EF normal.  Diffuse 3 vessel CAD, no stents placed.  Medical mgmt per [facility].   Family History  Problem Relation Age of Onset  . Cancer Mother     lung cancer  . Alcohol abuse Father   . Cancer Father     laryngeal cancer  . Cancer Brother     oldest brother had lung cancer and melanoma   History  Substance Use Topics  .  Smoking status: Former Games developer  . Smokeless tobacco: Former Neurosurgeon    Quit date: 06/22/1987  . Alcohol Use: Yes    Review of Systems  Constitutional: Negative.   Gastrointestinal: Negative for nausea.  Musculoskeletal:       Positive for left index finger pain.  Skin: Positive for color change and wound.  Neurological: Positive for numbness.  Hematological: Does not bruise/bleed easily.    Allergies  Statins and Tape  Home Medications   Current Outpatient Rx  Name  Route  Sig  Dispense  Refill  . aspirin EC 81 MG tablet   Oral   Take 81 mg by mouth daily.         . clotrimazole-betamethasone (LOTRISONE) cream      Apply to affected area twice daily as needed for rash   15 g   2   . glucagon (GLUCAGON EMERGENCY) 1 MG injection    Intramuscular   Inject 1 mg into the muscle once as needed.   2 each   prn   . Insulin Human (INSULIN PUMP) 100 unit/ml SOLN   Subcutaneous   Inject 1 each into the skin continuous. Novolog - ~50 units a day.   60 each   3   . levothyroxine (SYNTHROID, LEVOTHROID) 137 MCG tablet   Oral   Take 1 tablet (137 mcg total) by mouth daily.   90 tablet   1   . Omega-3 Fatty Acids (FISH OIL) 500 MG CAPS   Oral   Take 1 capsule by mouth daily.         . tadalafil (CIALIS) 20 MG tablet   Oral   Take 20 mg by mouth daily as needed. For sexual activity          BP 160/72  Pulse 74  Temp(Src) 97.5 F (36.4 C) (Oral)  Resp 18  SpO2 98% Physical Exam  Nursing note and vitals reviewed. Constitutional: He is oriented to person, place, and time. He appears well-developed and well-nourished. No distress.  HENT:  Head: Normocephalic and atraumatic.  Mouth/Throat: Oropharynx is clear and moist.  Eyes: Conjunctivae are normal.  Neck: Normal range of motion. Neck supple.  Cardiovascular: Normal rate, regular rhythm and normal heart sounds.   Pulmonary/Chest: Effort normal and breath sounds normal.  Musculoskeletal:       Hands: Neurological: He is alert and oriented to person, place, and time.  Skin: Skin is warm and dry. He is not diaphoretic.  Psychiatric: He has a normal mood and affect. His behavior is normal.    ED Course  Procedures (including critical care time) Labs Review Labs Reviewed - No data to display Imaging Review Dg Finger Index Left  01/27/2013   CLINICAL DATA:  Car door injury, laceration  EXAM: LEFT INDEX FINGER 2+V  COMPARISON:  None.  FINDINGS: There is no evidence of fracture or dislocation. There is no evidence of arthropathy or other focal bone abnormality. Soft tissues are unremarkable.  IMPRESSION: Negative.   Electronically Signed   By: Ruel Favors M.D.   On: 01/27/2013 18:50    EKG Interpretation   None       MDM   1. Contusion of index  finger    Neurovascularly intact. Xray normal, no fracture. Superficial laceration appears as a small cut, no wound to suture. Tdap given. Wound care given. Finger splint for comfort. Stable for discharge. Return precautions given. Patient states understanding of treatment care plan and is agreeable.     Melina Schools  Judeth CornfieldM Albert, PA-C 01/27/13 1906

## 2013-01-28 ENCOUNTER — Encounter: Payer: Self-pay | Admitting: Nutrition

## 2013-01-28 ENCOUNTER — Encounter: Payer: PRIVATE HEALTH INSURANCE | Attending: Internal Medicine | Admitting: Nutrition

## 2013-01-28 VITALS — BP 130/68

## 2013-01-28 DIAGNOSIS — E1042 Type 1 diabetes mellitus with diabetic polyneuropathy: Secondary | ICD-10-CM

## 2013-01-28 DIAGNOSIS — Z713 Dietary counseling and surveillance: Secondary | ICD-10-CM | POA: Insufficient documentation

## 2013-01-28 DIAGNOSIS — E1049 Type 1 diabetes mellitus with other diabetic neurological complication: Secondary | ICD-10-CM | POA: Insufficient documentation

## 2013-01-28 NOTE — Progress Notes (Signed)
Pt. Is here to review self care skills and blood sugar readings after pump changes.   Pt. Did not want to download his meter, but wanted to discuss the high blood sugar he had 2 nights ago. " My blood sugar went over 500 for no reason." At 6PM blood sugar was 287, at 11:30PM, it was 320.  He corrected this again.   At 3:15AM, blood sugar was 500,( he took 11u per pump),  at 3:30 it was 558.  At 4AM: 472, at 6:30 421.  He then changed his infusion set, but his cannula was not bent.  He was not sure why this happened.  We reviewed the protocol for high blood sugars.( One correction from the pump and then a infusion set change if no blood sugar drop.)  We also discussed the need to give a syringe of insulin after 2 corrections boluses.  He says that he has syringes.  Written protocol for this was given to him.  We also discussed the need to wait 2 hours before seeing a blood sugar drop, when he has not been getting basal insulin.  He reported good understanding of this, but said that he had never realized this.  Also discussed the need to give more that the correction bolus when he has not been getting basal insulin.  We agreed on 25% more.   We also reviewed all of the things to look at when blood sugars are going high and the cannula is not bent.  A list of these things was also given to him.   Discussed the importance of new sites to use for his infusion sets.  He admits that his legs have "some hardened tissues that he sometimes inject into".  Other options were given to him.   He has scheduled another appt. With me in 2 weeks to review blood sugar readings.

## 2013-01-29 NOTE — Patient Instructions (Signed)
Read over information given for high blood sugar protocols.  Call if quesitons

## 2013-01-30 ENCOUNTER — Telehealth: Payer: Self-pay | Admitting: Family Medicine

## 2013-01-30 MED ORDER — TERBINAFINE HCL 250 MG PO TABS: 250.0000 mg | ORAL_TABLET | Freq: Every day | ORAL | Status: DC

## 2013-01-30 NOTE — Telephone Encounter (Signed)
Ps call pt and tell him I sent a rx for a pill that he'll need to take every day for the next 3 months.  This will treat his toenail fungus.-thx

## 2013-01-30 NOTE — Telephone Encounter (Signed)
Patient aware.

## 2013-01-30 NOTE — Telephone Encounter (Signed)
Patient Information:  Caller Name: Gerlene BurdockRichard  Phone: [phone]  Patient: [name], [name]  Gender: Male  DOB: 10/04/1950  Age: [age] Years  PCP: Earley FavorMcGowen, [facility](Family Practice)  Office Follow Up:  Does the office need to follow up with this patient?: Yes  Instructions For The Office: Please follow up on script request - see note for details.  RN Note:  States toenails are yellow, thick, and brittle.  Patient states that this is something that has been ongoing for several years off and on with most recent episode starting inthe last six months.  States that wife has been trying to treat at home soaking in epsom salt and cleaning out from underneath toenails.  Patient spoke with pharmacist to get over the counter treatment recommendations and pharmacist informed patient that over the counter medications would not treat and he would need a prescription.  Wife removed a significant amount of stuff from under toenail the other day that patient states was unpleasant.  No redness, pain, tenderness to skin surrounding toe only toenail affected.  Symptoms  Reason For Call & Symptoms: Right large Toenail infection, lifting of the nail  Reviewed Health History In EMR: Yes  Reviewed Medications In EMR: Yes  Reviewed Allergies In EMR: Yes  Reviewed Surgeries / Procedures: Yes  Date of Onset of Symptoms: 07/03/2012  Treatments Tried: soaking in epsom salt, cleaning out drainage underneath the nail  Treatments Tried Worked: No  Guideline(s) Used:  No Protocol Available - Sick Adult  Disposition Per Guideline:   Discuss with PCP and Callback by Nurse Today  Reason For Disposition Reached:   Nursing judgment  Advice Given:  Call Back If:  New symptoms develop  You become worse.  Patient Refused Recommendation:  Patient Requests Prescription  Patient is requesting a script for treatment of fungal infection of toenail.  States that if evaluation is recommended before script today is only day he  can come in.

## 2013-01-30 NOTE — Telephone Encounter (Signed)
Please advise if patient needs OV or medication.

## 2013-02-21 ENCOUNTER — Ambulatory Visit: Payer: PRIVATE HEALTH INSURANCE | Admitting: Internal Medicine

## 2013-02-25 ENCOUNTER — Ambulatory Visit: Payer: PRIVATE HEALTH INSURANCE | Admitting: Nutrition

## 2013-02-28 ENCOUNTER — Ambulatory Visit: Payer: PRIVATE HEALTH INSURANCE | Admitting: Internal Medicine

## 2013-03-05 ENCOUNTER — Ambulatory Visit: Payer: PRIVATE HEALTH INSURANCE | Admitting: Nutrition

## 2013-03-18 ENCOUNTER — Ambulatory Visit (INDEPENDENT_AMBULATORY_CARE_PROVIDER_SITE_OTHER): Payer: PRIVATE HEALTH INSURANCE | Admitting: Cardiovascular Disease

## 2013-03-18 ENCOUNTER — Encounter: Payer: Self-pay | Admitting: Cardiovascular Disease

## 2013-03-18 VITALS — BP 132/72 | HR 77 | Ht 74.0 in | Wt 219.7 lb

## 2013-03-18 DIAGNOSIS — I6521 Occlusion and stenosis of right carotid artery: Secondary | ICD-10-CM

## 2013-03-18 DIAGNOSIS — Z79899 Other long term (current) drug therapy: Secondary | ICD-10-CM

## 2013-03-18 DIAGNOSIS — I359 Nonrheumatic aortic valve disorder, unspecified: Secondary | ICD-10-CM

## 2013-03-18 DIAGNOSIS — E785 Hyperlipidemia, unspecified: Secondary | ICD-10-CM

## 2013-03-18 DIAGNOSIS — I739 Peripheral vascular disease, unspecified: Secondary | ICD-10-CM

## 2013-03-18 DIAGNOSIS — I2510 Atherosclerotic heart disease of native coronary artery without angina pectoris: Secondary | ICD-10-CM

## 2013-03-18 DIAGNOSIS — I10 Essential (primary) hypertension: Secondary | ICD-10-CM | POA: Insufficient documentation

## 2013-03-18 DIAGNOSIS — I6529 Occlusion and stenosis of unspecified carotid artery: Secondary | ICD-10-CM

## 2013-03-18 DIAGNOSIS — I350 Nonrheumatic aortic (valve) stenosis: Secondary | ICD-10-CM

## 2013-03-18 NOTE — Assessment & Plan Note (Signed)
Patient has pain in his left calf at night while sleeping relieved with dependency. He has diminished pedal pulses bilaterally. He denies claudication. I am going to get lower extremity arterial Doppler studies.

## 2013-03-18 NOTE — Progress Notes (Signed)
03/18/2013 [name]   08-26-1950  [id number]  Primary Physician [name], MD Primary Cardiologist: Runell Gess MD [name]   HPI:  The patient returns today for followup. He is a 63 year old mildly overweight married Caucasian male, father of 3 and grandfather to 3 grandchildren, whom I initially saw October 12, 2011. He has a history of insulin-dependent diabetes on insulin pump, mild sleep apnea, and hyperlipidemia as well as mild aortic stenosis. He had a Myoview stress test that was abnormal which led to a heart catheterization performed by Dr. Tresa Endo in my absence revealing 70% disease in all 3 vessels with preserved LV function and mild AS. He also has moderate right ICA stenosis. He is neurologically asymptomatic. He was placed on beta-blocker and Ranexa. Apparently he is having mood swings, which may be a side effect of one of his medications. Telephone him 6 months ago he denies chest pain or shortness of breath. He has experienced some long-term memory loss and has had this worked up with an MRI of his brain. A concern this may be related to his statin therapy. We did recheck a 2-D echo which showed preserved LV function with mild progression of his aortic stenosis. Carotid Dopplers showed stable moderate right ICA stenosis. Since I saw him last 9 months ago he's remained critically stable. His only complaint is of discomfort in his left And night while sleeping relieved with dependency. He did stop the statin Cardizem because of intolerable side effects. He denies chest pain or shortness of breath. A 2-D echo revealed moderate aortic stenosis the valve area of 1.12 m and normal LV function. Carotid Dopplers remain stable.     Current Outpatient Prescriptions  Medication Sig Dispense Refill  . aspirin EC 81 MG tablet Take 81 mg by mouth daily.      . clotrimazole-betamethasone (LOTRISONE) cream Apply to affected area twice daily as needed for rash  15 g   2  . glucagon (GLUCAGON EMERGENCY) 1 MG injection Inject 1 mg into the muscle once as needed.  2 each  prn  . Insulin Human (INSULIN PUMP) 100 unit/ml SOLN Inject 1 each into the skin continuous. Novolog - ~50 units a day.  60 each  3  . levothyroxine (SYNTHROID, LEVOTHROID) 137 MCG tablet Take 1 tablet (137 mcg total) by mouth daily.  90 tablet  1  . Omega-3 Fatty Acids (FISH OIL) 500 MG CAPS Take 1 capsule by mouth daily.      . tadalafil (CIALIS) 20 MG tablet Take 20 mg by mouth daily as needed. For sexual activity       No current facility-administered medications for this visit.    Allergies  Allergen Reactions  . Statins     Problem with higher dosages.  . Tape Rash    Adhesive tape.  (Paper tape OK)    History   Social History  . Marital Status: Married    Spouse Name: N/A    Number of Children: N/A  . Years of Education: N/A   Occupational History  . Not on file.   Social History Main Topics  . Smoking status: Former Games developer  . Smokeless tobacco: Former Neurosurgeon    Quit date: 06/22/1987  . Alcohol Use: Yes  . Drug Use: No  . Sexual Activity: Not on file   Other Topics Concern  . Not on file   Social History Narrative   Divorced x1, remarried.  Has 3 daughters from first marriage.  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   Distant tobacco abuse (35 pack-yr hx), quit in the late 1990s, no ETOH or drugs.   No exercise.     Review of Systems: General: negative for chills, fever, night sweats or weight changes.  Cardiovascular: negative for chest pain, dyspnea on exertion, edema, orthopnea, palpitations, paroxysmal nocturnal dyspnea or shortness of breath Dermatological: negative for rash Respiratory: negative for cough or wheezing Urologic: negative for hematuria Abdominal: negative for nausea, vomiting, diarrhea, bright red blood per rectum, melena, or  hematemesis Neurologic: negative for visual changes, syncope, or dizziness All other systems reviewed and are otherwise negative except as noted above.    Blood pressure 132/72, pulse 77, height 6\' 2"  (1.88 m), weight 219 lb 11.2 oz (99.655 kg).  General appearance: alert and no distress Neck: no adenopathy, no JVD, supple, symmetrical, trachea midline, thyroid not enlarged, symmetric, no tenderness/mass/nodules and bilateral carotid bruits Lungs: clear to auscultation bilaterally Heart: 2/6 systolic ejection murmur consistent with aortic stenosis Extremities: diminished pedal pulses bilaterally  EKG not performed today  ASSESSMENT AND PLAN:   CAD (coronary artery disease) Known CAD by cath performed by Dr. Tresa EndoKelly 10/30/11 with moderate three-vessel disease and preserved LV function. Medical therapy was recommended. The patient denies chest pain or shortness of breath.  Aortic stenosis, moderate The patient has moderate aortic stenosis by 2-D echo which was recently was performed 12/10/12 revealed a normal early systolic function with an aortic valve area of 1.1 cm, peak gradient of 38 and mean of 21 mm mercury. This will be rechecked on an annual basis.  Carotid stenosis, right Patient has moderate stable bilateral carotid artery stenosis by duplex ultrasound was recently performed 01/11/13 with moderate right and mild to moderate left ICA stenosis. He is neurologically asymptomatic  Hyperlipidemia The patient does have hyperlipidemia and was on atorvastatin 40 mg a day back in June of last year. He stopped this on his own because of side effects. We will recheck a lipid and liver profile  Essential hypertension Controlled on current medications  Restless legs syndrome Patient has pain in his left calf at night while sleeping relieved with dependency. He has diminished pedal pulses bilaterally. He denies claudication. I am going to get lower extremity arterial Doppler  studies.      Runell GessJonathan J. Ameisha Mcclellan MD FACP,FACC,FAHA, [facility] 03/18/2013 3:01 PM

## 2013-03-18 NOTE — Assessment & Plan Note (Addendum)
The patient has moderate aortic stenosis by 2-D echo which was recently was performed 12/10/12 revealed a normal early systolic function with an aortic valve area of 1.1 cm, peak gradient of 38 and mean of 21 mm mercury. This will be rechecked on an annual basis.

## 2013-03-18 NOTE — Assessment & Plan Note (Signed)
Controlled on current medications 

## 2013-03-18 NOTE — Assessment & Plan Note (Signed)
Known CAD by cath performed by Dr. Tresa EndoKelly 10/30/11 with moderate three-vessel disease and preserved LV function. Medical therapy was recommended. The patient denies chest pain or shortness of breath.

## 2013-03-18 NOTE — Assessment & Plan Note (Signed)
The patient does have hyperlipidemia and was on atorvastatin 40 mg a day back in June of last year. He stopped this on his own because of side effects. We will recheck a lipid and liver profile

## 2013-03-18 NOTE — Assessment & Plan Note (Signed)
Patient has moderate stable bilateral carotid artery stenosis by duplex ultrasound was recently performed 01/11/13 with moderate right and mild to moderate left ICA stenosis. He is neurologically asymptomatic

## 2013-03-18 NOTE — Patient Instructions (Signed)
  Your physician wants you to follow-up with him in : 1 year with Dr Allyson SabalBerry                                            and with an extender in : 6 months                     You will receive a reminder letter in the mail one month in advance. If you don't receive a letter, please call our office to schedule the follow-up appointment.   Your physician recommends that you return for lab work in: 1-2 weeks, fasting   Your physician has ordered the following tests:  1. Lower extremity arterial dopplers 2. Carotid dopplers to be done in June 2015

## 2013-04-02 ENCOUNTER — Encounter: Payer: Self-pay | Admitting: Internal Medicine

## 2013-04-02 ENCOUNTER — Ambulatory Visit (INDEPENDENT_AMBULATORY_CARE_PROVIDER_SITE_OTHER): Payer: PRIVATE HEALTH INSURANCE | Admitting: Internal Medicine

## 2013-04-02 VITALS — BP 128/72 | HR 87 | Temp 97.9°F | Resp 12 | Wt 216.0 lb

## 2013-04-02 DIAGNOSIS — E1049 Type 1 diabetes mellitus with other diabetic neurological complication: Secondary | ICD-10-CM

## 2013-04-02 DIAGNOSIS — E1142 Type 2 diabetes mellitus with diabetic polyneuropathy: Secondary | ICD-10-CM

## 2013-04-02 DIAGNOSIS — E1042 Type 1 diabetes mellitus with diabetic polyneuropathy: Secondary | ICD-10-CM

## 2013-04-02 NOTE — Progress Notes (Signed)
Patient ID: [name], male   DOB: 1950/11/03, 63 y.o.   MRN: [id number]  HPI: [name] is a 63 y.o.-year-old male, returning for f/u for DM1, dx 8 (63 y/o), uncontrolled, with complications (CAD, proliferative diabetic retinopathy-history of retinal detachment OD, right carotid stenosis, PVD, peripheral neuropathy, ED). Last visit 2.5 mo ago, did not return after 1 mo as advised.  He has sinus drainage. No fever/HA. Has had drainage for 3 weeks. Also had a toe nail fungal infection >> tx with Lotrisone.  Last hemoglobin A1c was: Lab Results  Component Value Date   HGBA1C 9.6* 01/15/2013   HGBA1C 10.0* 05/08/2012   HGBA1C 9.4* 04/21/2011   Pt is on an insulin pump: One Touch Ping (Animas) with NovoLog.   He now uses the dual wave boluses after meeting with Cristy Folks for DM education.   He recently got a new pump.  Pump settings  - basal rate:  MN 0.8 U/h >> 0.7 2 am: 0.85 >> 0.75 7 am: 0.8 >> 0.85  11 am: 0.85 - ICR: 14  - ISF: 30 >> MN: 30  11 am: 25 - target:  MN: 140 5:30: 120 22: 150 - IOB: 4 h  TDD bolus 12-17 TDD basal 19 He boluses right before or right after he eats! Despite advice to bolus 15 min before.   Pt checks his sugars 4 a day and they are: - am: 150-250 >> 110-250s (yesterday: 55) >> 150-200 - 2h after b'fast: n/c - before lunch: 180-275 >>  200s >> 175-250 - 2h after lunch: 295 x 1 - before dinner: 180-275 >> 200s >> 175-250, few 300s - bedtime: can go to 300s >> 300-400s >> 112-450 No lows. Lowest sugar was 40s at 3 am; had a 76 at night  he has hypoglycemia awareness at 100. He had 400s x 4 (sometimes b/c insertion site pbs).   He has a new meter: [employer]. Pt's meals are: - Breakfast: 1.5 scramble eggs, toast, milk - 65 g carbs - Lunch: sandwich, shaved chicken or [place] breast, cheese, mayo, chips, soda - 100 carbs - Dinner: meat, vegetables, bread, fruit, milk - 100 g carbs - Snacks: 2-3 a day: bagel midmorning, evening snack:  chips, popcorn  Pt does not have chronic kidney disease, last BUN/creatinine was:  Lab Results  Component Value Date   BUN 13 05/08/2012   CREATININE 1.0 05/08/2012  Last microalbumin/creatinine ratio was 3.8.  Last set of lipids: Lab Results  Component Value Date   CHOL 200 05/08/2012   HDL 65.40 05/08/2012   LDLCALC 120* 05/08/2012   LDLDIRECT 159.5 04/21/2011   TRIG 71.0 05/08/2012   CHOLHDL 3 05/08/2012  He is not on a statin, this is listed as an intolerance. Pt's last eye exam was in 12/2012. + DR - OD, s/p detached retina, but stable. Has numbness and tingling in his legs.  He also has a history of hyperlipidemia, disequilibrium, mild cognitive impairment, hypothyroidism-status post thyroidectomy for multinodular goiter - last TSH in 01/2013, normal. He also has OSA-on CPAP, GERD.  I reviewed pt's medications, allergies, PMH, social hx, family hx and no changes required, except as mentioned above.  ROS: Constitutional: no weight gain, no fatigue, no subjective hyperthermia/hypothermia Eyes: no blurry vision, no xerophthalmia ENT: no sore throat, no nodules palpated in throat, no dysphagia/odynophagia, no hoarseness, + tinnitus Cardiovascular: no CP/SOB/palpitations/+ leg swelling - wears compression hoses Respiratory: no cough/SOB Gastrointestinal: no N/V/D/C Musculoskeletal: no muscle aches/+ joint aches Skin: no rashes Neurological:  no tremors/numbness/tingling/dizziness Diff. w/ erections  PE: BP 128/72  Pulse 87  Temp(Src) 97.9 F (36.6 C) (Oral)  Resp 12  Wt 216 lb (97.977 kg)  SpO2 96% Wt Readings from Last 3 Encounters:  04/02/13 216 lb (97.977 kg)  03/18/13 219 lb 11.2 oz (99.655 kg)  01/18/13 218 lb (98.884 kg)  There is no weight on file to calculate BMI.  Constitutional: slightly overweight, in NAD Eyes: PERRLA, EOMI, no exophthalmos ENT: moist mucous membranes, no thyromegaly, no cervical lymphadenopathy Cardiovascular: RRR, + 2/6 SEM, no RG Respiratory:  CTA B Gastrointestinal: abdomen soft, NT, ND, BS+ Musculoskeletal: no deformities, strength intact in all 4 Skin: moist, warm, no rashes  ASSESSMENT: 1. 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  Patient with long-standing type 1 diabetes, poorly controlled, with multiple complications, on an insulin pump, however without good understanding of the pump principles and settings. Sugars are  high throughout the day. At last visit, he had some low CBGs at night, not after decreasing the basal rates at night. - he gets ~ the same total daily dose of insulin from bolusing as from basal, so he is not necessarily underbolusing - at last visit I advised him to move his bolusing 15 minutes before he starts eating, but he only moved this right before eating or right after. He saw an improvement in sugars with this.  - I advised him to change the following; Patient Instructions  Pump settings  - basal rate:  MN: 0.7 2 am:  0.75 7 am: 0.85 >> 0.9 11 am: 0.85 >> 0.9 - ICR: 14 >> 12 - ISF: MN: 30   11 am: 25 - target:  MN: 140 5:30 am: 120 >> 110 22: 150 - IOB: 4 h  Please move the bolus insulin to 15 min before a meal.  - I referred him to diabetes education. He saw [name] once, did not return for f/u after 2 weeks as advised. - I will see him in 1.6 mo

## 2013-04-02 NOTE — Patient Instructions (Signed)
Pump settings  - basal rate:  MN: 0.7 2 am:  0.75 7 am: 0.85 >> 0.9 11 am: 0.85 >> 0.9 - ICR: 14 >> 12 - ISF: MN: 30   11 am: 25 - target:  MN: 140 5:30 am: 120 >> 110 22: 150 - IOB: 4 h  Please move the bolus insulin to 15 min before a meal.

## 2013-04-09 ENCOUNTER — Encounter: Payer: Self-pay | Admitting: Internal Medicine

## 2013-04-19 ENCOUNTER — Other Ambulatory Visit: Payer: Self-pay | Admitting: Family Medicine

## 2013-04-19 NOTE — Telephone Encounter (Signed)
Patient called requesting refill of terbinafine but no need for medication refill after 90 days the course is complete.  Patient aware that fungus may still be noticeable for months after medication use.

## 2013-04-22 ENCOUNTER — Other Ambulatory Visit: Payer: Self-pay | Admitting: Family Medicine

## 2013-04-22 MED ORDER — LEVOTHYROXINE SODIUM 137 MCG PO TABS: 137.0000 ug | ORAL_TABLET | Freq: Every day | ORAL | Status: DC

## 2013-04-22 NOTE — Telephone Encounter (Signed)
Patient called requesting 90 day Rx [facility] [place]  Rx sent per [place] protocol.

## 2013-05-10 ENCOUNTER — Telehealth: Payer: Self-pay | Admitting: Family Medicine

## 2013-05-10 ENCOUNTER — Telehealth: Payer: Self-pay | Admitting: Cardiovascular Disease

## 2013-05-10 NOTE — Telephone Encounter (Signed)
Patient Information:  Caller Name: Gerlene BurdockRichard  Phone: [phone]  Patient: [name], [name]  Gender: Male  DOB: 10/23/1950  Age: [age] Years  PCP: Earley FavorMcGowen, Phillip [facility](Family Practice)  Office Follow Up:  Does the office need to follow up with this patient?: Yes  Instructions For The Office: Pls see RN note.  RN Note:  Fainting spell, onset 5-6. Pt was walking across floor at work when first episode occured and again standing in [employer] on 5-7, Pt held on to shelving to stop him from falling. Pt states he heard buzzing in Ears, felt dizzy as though to pass out, Pt caught himself before falling w/ both episodes.  Pt works in lab setting, very calm environment per Pt.   Fainting protocol used, ED dispo d/t Fainting speels w/ hx of Heart problems.  Pt has 30% Carotid Artery and 70% Heart blockages w/ PAD.  Pt has appt w/ Cards in June for Vein Doppler study.  Offered to consulted w/ PCP, Pt is unable to leave work until 5pm on 5-8.  Advised Pt to f/u w/ Cards as well as go to ED for evaluation after he gets off work.  Please f/u w/ Pt if PCP wants to see Pt after 5pm on 5-8, otherwise, Pt will go to ED for eval after 5pm.  Symptoms  Reason For Call & Symptoms: Fainting spell, onset 5-6.  Reviewed Health History In EMR: Yes  Reviewed Medications In EMR: Yes  Reviewed Allergies In EMR: Yes  Reviewed Surgeries / Procedures: Yes  Date of Onset of Symptoms: 05/08/2013  Guideline(s) Used:  Fainting  Disposition Per Guideline:   Go to ED Now  Reason For Disposition Reached:   History of heart problems or congestive heart failure  Advice Given:  N/A  Patient Will Follow Care Advice:  YES

## 2013-05-10 NOTE — Telephone Encounter (Signed)
Returned call and pt verified x 2.  Pt with complaints below.  Wanted to know if he needed a sooner appt b/c PCPs office said to call here.  Denied passing out.  Appt scheduled for Monday at 4 pm w/ Wilburt FinlayBryan Hager, PA-C.  Pt advised to have someone take him or call 911 to ER for loss of consciousness or worsening symptoms.  Pt verbalized understanding and agreed w/ plan.

## 2013-05-10 NOTE — Telephone Encounter (Signed)
Please call, had 2 near fainting spells on Wednesday and Thursday.

## 2013-05-10 NOTE — Telephone Encounter (Signed)
Please advise 

## 2013-05-13 ENCOUNTER — Encounter: Payer: Self-pay | Admitting: Physician Assistant

## 2013-05-13 ENCOUNTER — Ambulatory Visit (INDEPENDENT_AMBULATORY_CARE_PROVIDER_SITE_OTHER): Payer: PRIVATE HEALTH INSURANCE | Admitting: Physician Assistant

## 2013-05-13 VITALS — BP 142/74 | Ht 74.0 in | Wt 217.0 lb

## 2013-05-13 DIAGNOSIS — I10 Essential (primary) hypertension: Secondary | ICD-10-CM

## 2013-05-13 DIAGNOSIS — R55 Syncope and collapse: Secondary | ICD-10-CM

## 2013-05-13 DIAGNOSIS — I6521 Occlusion and stenosis of right carotid artery: Secondary | ICD-10-CM

## 2013-05-13 DIAGNOSIS — I6529 Occlusion and stenosis of unspecified carotid artery: Secondary | ICD-10-CM

## 2013-05-13 NOTE — Assessment & Plan Note (Signed)
Patient reports 2 episodes one last Wednesday approximately 7:30 in the evening and the others the following Thursday approximately 1230 hours where he felt like he was going to pass out. He was walking and both times he felt his vision going black. He has not noticed any other associated symptoms.  We'll place a CardioNet monitor for 2 weeks. Also if his carotid Dopplers which were scheduled for June moved up next available. Check a magnesium and and basic metabolic panel. EKG shows normal sinus rhythm with a rate of 70 beats per minute nonspecific ST changes inferiorly and laterally

## 2013-05-13 NOTE — Progress Notes (Signed)
Date:  05/13/2013   ID:  [name], DOB 08/02/1950, MRN [id number]  PCP:  Jeoffrey MassedMCGOWEN,PHILIP H, MD  Primary Cardiologist:  Allyson SabalBerry     History of Present Illness: [name] is a 63 y.o.  mildly overweight married Caucasian male, father of 3 and grandfather to 3 grandchildren, whom saw Dr. Allyson SabalBerry initially on October 12, 2011. He has a history of insulin-dependent diabetes on insulin pump, mild sleep apnea, and hyperlipidemia as well as mild aortic stenosis. He had a Myoview stress test that was abnormal which led to a heart catheterization performed by Dr. Tresa EndoKelly in my absence revealing 70% disease in all 3 vessels with preserved LV function and mild AS. He also has moderate right ICA stenosis.  He was placed on beta-blocker and Ranexa. Apparently he is having mood swings, which may be a side effect of one of his medications.  He has experienced some long-term memory loss and has had this worked up with an MRI of his brain. A concern this may be related to his statin therapy. We did recheck a 2-D echo which showed preserved LV function with mild progression of his aortic stenosis. Carotid Dopplers showed stable moderate right ICA stenosis. Since I saw him last 9 months ago he's remained critically stable. A 2-D echo revealed moderate aortic stenosis the valve area of 1.12 m and normal LV function. Carotid Dopplers remain stable.  Patient reports 2 episodes of dizziness, one last Wednesday at approximately 7:30 in the evening and the other the following Thursday at approximately 1230 hours.  Each time he felt like he was going to pass out. He was walking both times and felt his vision going black. He has not noticed any other associated symptoms.  The patient currently denies nausea, vomiting, fever, chest pain, shortness of breath, orthopnea, PND, cough, congestion, abdominal pain, hematochezia, melena, lower extremity edema, claudication.  Wt Readings from Last 3 Encounters:  05/13/13 217 lb  (98.431 kg)  04/02/13 216 lb (97.977 kg)  03/18/13 219 lb 11.2 oz (99.655 kg)     Past Medical History  Diagnosis Date  . Diabetes mellitus Dx'd age 63    Novolog via insulin pump; sub-optimal control x years  . High cholesterol     Takes 1/2 of 80mg  atorvastatin daily  . Hypothyroidism     Thyroidectomy 2002; multinodular goiter  . Erectile dysfunction     [place]  . Diabetic retinopathy     Proliferative: Hx of retinal detachment on right-light perception only in right eye  . History of tobacco abuse     Quit about 1990 (has 35 pack-yr hx)  . OSA (obstructive sleep apnea)     06/2011 sleep study: moderate OSA, CPAP at 12 cm H2O.  . Impaired vision     right eye light perception only; hx of retinal detachment.  . Recurrent pneumonia   . GERD (gastroesophageal reflux disease)   . Venous insufficiency   . Diastolic dysfunction 2007    TEE with diastolic dysfunction, EF 74%.  . Diabetic neuropathy     fine touch and position sense affected  . Aortic stenosis, mild Sept/Oct /2013    Mild/mod by echo; mild by cath 10/28/11.  Mild progression by echo 05/2012--f/u echo annually recommended (patient asymptomatic).  . Carotid stenosis, right 09/2011    50-70% stenosis, stable on repeat dopplers 05/2012 but left sided velocities increased 01/2013. Dr. Allyson SabalBerry suggests f/u doppler in 6 mo.  . CAD, multiple vessel 10/2011  Inferolateral perfusion defect + EKG abnormality during stress testing prompted Cath by [facility]; 3V CAD, EF normal.  Med mgmt recommended.  . Abnormal stress test 10/27/2011    lat isch--cardiac cath 10/28/2011  . Venous reflux 10/14/2011    venous doppler-R GSV continuous reflux throughout; too small for VNUS closure  . Decreased pedal pulses     LE doppler 11/08/11- no evidence of arterial insufficiency    Current Outpatient Prescriptions  Medication Sig Dispense Refill  . aspirin EC 81 MG tablet Take 81 mg by mouth daily.      .  clotrimazole-betamethasone (LOTRISONE) cream Apply to affected area twice daily as needed for rash  15 g  2  . glucagon (GLUCAGON EMERGENCY) 1 MG injection Inject 1 mg into the muscle once as needed.  2 each  prn  . Insulin Human (INSULIN PUMP) 100 unit/ml SOLN Inject 1 each into the skin continuous. Novolog - ~50 units a day.  60 each  3  . levothyroxine (SYNTHROID, LEVOTHROID) 137 MCG tablet Take 1 tablet (137 mcg total) by mouth daily.  90 tablet  1  . Omega-3 Fatty Acids (FISH OIL) 500 MG CAPS Take 1 capsule by mouth daily.      . tadalafil (CIALIS) 20 MG tablet Take 20 mg by mouth daily as needed. For sexual activity       No current facility-administered medications for this visit.    Allergies:    Allergies  Allergen Reactions  . Statins     Problem with higher dosages.  . Tape Rash    Adhesive tape.  (Paper tape OK)    Social History:  The patient  reports that he has quit smoking. He quit smokeless tobacco use about 25 years ago. He reports that he drinks alcohol. He reports that he does not use illicit drugs.   Family history:   Family History  Problem Relation Age of Onset  . Cancer Mother     lung cancer  . Alcohol abuse Father   . Cancer Father     laryngeal cancer  . Cancer Brother     oldest brother had lung cancer and melanoma    ROS:  Please see the history of present illness.  All other systems reviewed and negative.   PHYSICAL EXAM: VS:  BP 142/74  Ht 6\' 2"  (1.88 m)  Wt 217 lb (98.431 kg)  BMI 27.85 kg/m2 Well nourished, well developed, in no acute distress HEENT: Pupils are equal round react to light accommodation extraocular movements are intact.  Neck: no JVDbilateral carotid bruits. Cardiac: Regular rate and rhythm with 2 out 6 systolic murmur Lungs:  clear to auscultation bilaterally, no wheezing, rhonchi or rales Ext: no lower extremity edema.  2+ radial and dorsalis pedis pulses. Skin: warm and dry Neuro:  Grossly normal  EKG:  Normal sinus  rhythm with nonspecific T wave changes inferiorly and laterally. Rate 78 beats per minute  ASSESSMENT AND PLAN:  Problem List Items Addressed This Visit   Carotid stenosis, right     Rechecking carotid Dopplers sooner than planned previously    Essential hypertension     Blood pressures mildly elevated but given neurological symptoms, I am not going to make any adjustments.    Pre-syncope     Patient reports 2 episodes one last Wednesday approximately 7:30 in the evening and the others the following Thursday approximately 1230 hours where he felt like he was going to pass out. He was walking and both times  he felt his vision going black. He has not noticed any other associated symptoms.  We'll place a CardioNet monitor for 2 weeks. Also if his carotid Dopplers which were scheduled for June moved up next available. Check a magnesium and and basic metabolic panel. EKG shows normal sinus rhythm with a rate of 70 beats per minute nonspecific ST changes inferiorly and laterally     Other Visit Diagnoses   Syncope    -  Primary    Relevant Orders       EKG 12-Lead       Basic Metabolic Panel (BMET)       Magnesium

## 2013-05-13 NOTE — Patient Instructions (Addendum)
1. Will check magnesium and basic metabolic panel. 2. Cardionet monitor for 2 weeks. 3. Carotid Dopplers at next available appointment. 4 schedule followup appointment

## 2013-05-13 NOTE — Assessment & Plan Note (Signed)
Blood pressures mildly elevated but given neurological symptoms, I am not going to make any adjustments.

## 2013-05-13 NOTE — Assessment & Plan Note (Signed)
Rechecking carotid Dopplers sooner than planned previously

## 2013-05-14 ENCOUNTER — Ambulatory Visit: Payer: PRIVATE HEALTH INSURANCE | Admitting: Internal Medicine

## 2013-05-14 LAB — LIPID PANEL
CHOL/HDL RATIO: 3.9 ratio
Cholesterol: 264 mg/dL — ABNORMAL HIGH (ref 0–200)
HDL: 67 mg/dL (ref >39)
LDL CALC: 183 mg/dL — AB (ref 0–99)
Triglycerides: 68 mg/dL (ref <150)
VLDL: 14 mg/dL (ref 0–40)

## 2013-05-14 LAB — BASIC METABOLIC PANEL
BUN: 15 mg/dL (ref 6–23)
CHLORIDE: 99 meq/L (ref 96–112)
CO2: 28 meq/L (ref 19–32)
Calcium: 8.9 mg/dL (ref 8.4–10.5)
Creat: 1.04 mg/dL (ref 0.50–1.35)
GLUCOSE: 256 mg/dL — AB (ref 70–99)
Potassium: 5 mEq/L (ref 3.5–5.3)
SODIUM: 135 meq/L (ref 135–145)

## 2013-05-14 LAB — HEPATIC FUNCTION PANEL
ALT: 16 U/L (ref 0–53)
AST: 18 U/L (ref 0–37)
Albumin: 3.8 g/dL (ref 3.5–5.2)
Alkaline Phosphatase: 75 U/L (ref 39–117)
BILIRUBIN DIRECT: 0.2 mg/dL (ref 0.0–0.3)
BILIRUBIN TOTAL: 0.8 mg/dL (ref 0.2–1.2)
Indirect Bilirubin: 0.6 mg/dL (ref 0.2–1.2)
Total Protein: 6.5 g/dL (ref 6.0–8.3)

## 2013-05-14 LAB — MAGNESIUM: Magnesium: 1.8 mg/dL (ref 1.5–2.5)

## 2013-05-15 ENCOUNTER — Telehealth: Payer: Self-pay | Admitting: *Deleted

## 2013-05-15 DIAGNOSIS — E785 Hyperlipidemia, unspecified: Secondary | ICD-10-CM

## 2013-05-15 NOTE — Telephone Encounter (Signed)
Message copied by Marella BileVOGEL, Brody Bonneau W. on Wed May 15, 2013  2:36 PM ------      Message from: Runell GessBERRY, JONATHAN J      Created: Tue May 14, 2013  9:21 PM       Refer to [employer]remy Smart ------

## 2013-05-15 NOTE — Telephone Encounter (Signed)
Noted  

## 2013-05-16 NOTE — Telephone Encounter (Signed)
I spoke with [name] and he will call and set up appt with [name] at a time that is convenient with his schedule.  He did not want me to set up an appt at this time.

## 2013-05-21 ENCOUNTER — Ambulatory Visit (HOSPITAL_COMMUNITY)
Admission: RE | Admit: 2013-05-21 | Discharge: 2013-05-21 | Disposition: A | Discharge status: Home / Self Care | Payer: PRIVATE HEALTH INSURANCE | Source: Ambulatory Visit | Attending: Internal Medicine | Admitting: Internal Medicine

## 2013-05-21 DIAGNOSIS — I6529 Occlusion and stenosis of unspecified carotid artery: Secondary | ICD-10-CM | POA: Insufficient documentation

## 2013-05-21 NOTE — Progress Notes (Signed)
Carotid Duplex Completed. °Brianna L Mazza,RVT °

## 2013-05-23 ENCOUNTER — Ambulatory Visit: Payer: PRIVATE HEALTH INSURANCE | Admitting: Pharmacist

## 2013-05-30 NOTE — Addendum Note (Signed)
Addended by: Vincenza Hews on: 05/30/2013 07:34 AM   Modules accepted: Orders

## 2013-06-06 ENCOUNTER — Other Ambulatory Visit: Payer: Self-pay

## 2013-06-06 DIAGNOSIS — R55 Syncope and collapse: Secondary | ICD-10-CM

## 2013-06-11 ENCOUNTER — Ambulatory Visit (INDEPENDENT_AMBULATORY_CARE_PROVIDER_SITE_OTHER): Payer: PRIVATE HEALTH INSURANCE | Admitting: Pharmacist

## 2013-06-11 ENCOUNTER — Encounter (HOSPITAL_COMMUNITY): Payer: PRIVATE HEALTH INSURANCE

## 2013-06-11 ENCOUNTER — Ambulatory Visit (HOSPITAL_COMMUNITY)
Admission: RE | Admit: 2013-06-11 | Discharge: 2013-06-11 | Disposition: A | Discharge status: Home / Self Care | Payer: PRIVATE HEALTH INSURANCE | Source: Ambulatory Visit | Attending: Cardiovascular Disease | Admitting: Cardiovascular Disease

## 2013-06-11 VITALS — Wt 218.0 lb

## 2013-06-11 DIAGNOSIS — Z79899 Other long term (current) drug therapy: Secondary | ICD-10-CM

## 2013-06-11 DIAGNOSIS — I739 Peripheral vascular disease, unspecified: Secondary | ICD-10-CM

## 2013-06-11 DIAGNOSIS — E785 Hyperlipidemia, unspecified: Secondary | ICD-10-CM

## 2013-06-11 MED ORDER — PRAVASTATIN SODIUM 10 MG PO TABS: 10.0000 mg | ORAL_TABLET | Freq: Every day | ORAL | Status: DC

## 2013-06-11 NOTE — Progress Notes (Signed)
Lower Extremity Arterial Duplex Completed. °Brianna L Mazza,RVT °

## 2013-06-11 NOTE — Progress Notes (Signed)
Patient is a 63 y.o. WM referred to lipid clinic by Dr. [name].  Patient has a h/o multi-vessel disease with >50% obstruction in multiple vessels on cath.  He also has a h/o Type 1 diabetes.  Patient took Lipitor 20 mg qd from 01/2012 to 12/2012 and eventually had to stop due to memory impairment.  He tells me it was so bad he nearly lost his job because he couldn't function.  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  It took nearly 3 months for his memory issues to resolve.  He stopped Zetia on his own.  Crestor was suggested at one point, but wasn't taken due to cost.  Patient is fearful of having memory issues with medication again, so is hesitant about taking cholesterol medication. Family history is strong for cancer, however it appears only his father had any history of CAD, but was a heavy smoker and died of cancer.    LDL goal < 70, non-HDL goal < 100 Meds:  Not on lipid lowering therapy currently. Intolerant:  Lipitor 20 mg qd (memory impairment)  Diet:  Breakfast consists of 2 fried eggs and toast. Lunch and dinner are typically sandwich from home.  He doesn't eat vegetables.  His wife is caring for her father in an assisted living facility, so he fends for himself for meals. Exercise:  None currently.  Goes to the gym only when his wife goes, but hasn't been going as she is caring for her father. Social history:  Denies alcohol use.  Quit smoking 30 years ago.  Labs: 05/2013:  TC 264, TG 68, HDL 67, LDL 183, LFTs normal (not on lipid lowering therapy)  Current Outpatient Prescriptions  Medication Sig Dispense Refill  . aspirin EC 81 MG tablet Take 81 mg by mouth daily.      . clotrimazole-betamethasone (LOTRISONE) cream Apply to affected area twice daily as needed for rash  15 g  2  . glucagon (GLUCAGON EMERGENCY) 1 MG injection Inject 1 mg into the muscle once as needed.  2 each  prn  . Insulin Human (INSULIN PUMP) 100 unit/ml SOLN Inject 1 each into the skin continuous. Novolog -  ~50 units a day.  60 each  3  . levothyroxine (SYNTHROID, LEVOTHROID) 137 MCG tablet Take 1 tablet (137 mcg total) by mouth daily.  90 tablet  1  . Omega-3 Fatty Acids (FISH OIL) 500 MG CAPS Take 1 capsule by mouth daily.      . tadalafil (CIALIS) 20 MG tablet Take 20 mg by mouth daily as needed. For sexual activity       No current facility-administered medications for this visit.   Allergies  Allergen Reactions  . Statins     Problem with higher dosages (Lipitor caused memory issues)  . Tape Rash    Adhesive tape.  (Paper tape OK)   Family History  Problem Relation Age of Onset  . Cancer Mother     lung cancer  . Alcohol abuse Father   . Cancer Father     laryngeal cancer  . Cancer Brother     oldest brother had lung cancer and melanoma

## 2013-06-11 NOTE — Assessment & Plan Note (Addendum)
Given patient's severe memory reaction to lipitor, and it almost costing him his job, he is hesitant to take lipid lowering therapy.  PCSK-9 inhibitor, and high potency statins will be avoided due to potential for memory problems.  Patient is agreeable to low dose pravastatin, and the possibility of increasing it in the future if LDL remains elevated and he tolerates it well.  Will also change breakfast as he needs to do a high fiber breakfast, or at least change to eggs whites.  Patient is to hopefully try and increase aerobic activity since high potency statin is likely not an option. Plan: 1.  Stop eating fried eggs in morning. Prefer high fiber cereal or oatmeal.  If need to do eggs, only use egg whites, and use Metamucil between breakfast and lunch.  1 tablespoon Metamucil powder with 8 ounces of water and drink. 2.  Start pravastatin 10 mg once daily in the evening. 3.  Recheck cholesterol and liver in 2-3 months (08/26/13 - fasting labs, show up anytime after 7:30 am), and see [name] the next day on 08/27/13 at 4:00 pm.

## 2013-06-11 NOTE — Patient Instructions (Signed)
1.  Stop eating fried eggs in morning. Prefer high fiber cereal or oatmeal.  If need to do eggs, only use egg whites, and use Metamucil between breakfast and lunch.  1 tablespoon Metamucil powder with 8 ounces of water and drink. 2.  Start pravastatin 10 mg once daily in the evening. 3.  Recheck cholesterol and liver in 2-3 months (08/26/13 - fasting labs, show up anytime after 7:30 am), and see Alfonse Ras the next day on 08/27/13 at 4:00 pm.

## 2013-06-21 ENCOUNTER — Ambulatory Visit (HOSPITAL_COMMUNITY): Payer: PRIVATE HEALTH INSURANCE

## 2013-06-24 ENCOUNTER — Telehealth: Payer: Self-pay | Admitting: Family Medicine

## 2013-06-24 MED ORDER — LEVOTHYROXINE SODIUM 137 MCG PO TABS: 137.0000 ug | ORAL_TABLET | Freq: Every day | ORAL | Status: DC

## 2013-06-24 NOTE — Telephone Encounter (Signed)
Pt requesting rf of levothyroxine.  Pt would like 90 day supply w/ enough rf's for a year.  Please advise.

## 2013-06-24 NOTE — Telephone Encounter (Signed)
Rx sent to pharmacy   

## 2013-07-01 ENCOUNTER — Telehealth: Payer: Self-pay | Admitting: Family Medicine

## 2013-07-01 MED ORDER — LEVOTHYROXINE SODIUM 137 MCG PO TABS: 137.0000 ug | ORAL_TABLET | Freq: Every day | ORAL | Status: DC

## 2013-07-01 NOTE — Telephone Encounter (Signed)
Patient will need to contact his new pharmacy.  They transfer Rx's.

## 2013-07-01 NOTE — Telephone Encounter (Signed)
Thank you, patient has a fu appt scheduled 07/17/13.

## 2013-07-01 NOTE — Telephone Encounter (Signed)
Patient states he already contacted the pharmacy. They told him they would fax the request. I've left a message on the pharmacy VM to call me back.

## 2013-07-01 NOTE — Telephone Encounter (Signed)
[facility]  Sent in 90 day supply of levothyroxine.  Patient will need f/u appointment in July.

## 2013-07-01 NOTE — Telephone Encounter (Signed)
[facility] [address] [place] It is much cheaper for patient to get it there.

## 2013-07-03 ENCOUNTER — Ambulatory Visit (INDEPENDENT_AMBULATORY_CARE_PROVIDER_SITE_OTHER): Payer: PRIVATE HEALTH INSURANCE | Admitting: Cardiovascular Disease

## 2013-07-03 ENCOUNTER — Encounter: Payer: Self-pay | Admitting: Cardiovascular Disease

## 2013-07-03 VITALS — BP 137/77 | HR 80 | Ht 74.0 in | Wt 218.7 lb

## 2013-07-03 DIAGNOSIS — E785 Hyperlipidemia, unspecified: Secondary | ICD-10-CM

## 2013-07-03 DIAGNOSIS — I10 Essential (primary) hypertension: Secondary | ICD-10-CM

## 2013-07-03 DIAGNOSIS — I359 Nonrheumatic aortic valve disorder, unspecified: Secondary | ICD-10-CM

## 2013-07-03 DIAGNOSIS — I6521 Occlusion and stenosis of right carotid artery: Secondary | ICD-10-CM

## 2013-07-03 DIAGNOSIS — I6529 Occlusion and stenosis of unspecified carotid artery: Secondary | ICD-10-CM

## 2013-07-03 DIAGNOSIS — I2583 Coronary atherosclerosis due to lipid rich plaque: Secondary | ICD-10-CM

## 2013-07-03 DIAGNOSIS — I350 Nonrheumatic aortic (valve) stenosis: Secondary | ICD-10-CM

## 2013-07-03 DIAGNOSIS — I2510 Atherosclerotic heart disease of native coronary artery without angina pectoris: Secondary | ICD-10-CM

## 2013-07-03 NOTE — Progress Notes (Signed)
07/03/2013 [name]   10/29/1950  [id number]  Primary Physician Jeoffrey MassedMCGOWEN,PHILIP H, MD Primary Cardiologist: Runell GessJonathan J. Alona Danford MD Roseanne RenoFACP,FACC,FAHA, FSCAI   HPI:  The patient returns today for followup. He is a 63 year old mildly overweight married Caucasian male, father of 3 and grandfather to 3 grandchildren, whom I initially saw October 12, 2011.I last saw him in the office 03/18/13. He has a history of insulin-dependent diabetes on insulin pump, mild sleep apnea, and hyperlipidemia as well as mild aortic stenosis. He had a Myoview stress test that was abnormal which led to a heart catheterization performed by Dr. Tresa EndoKelly in my absence revealing 70% disease in all 3 vessels with preserved LV function and mild AS. He also has moderate right ICA stenosis. He is neurologically asymptomatic. He was placed on beta-blocker and Ranexa. Apparently he is having mood swings, which may be a side effect of one of his medications. Telephone him 6 months ago he denies chest pain or shortness of breath. He has experienced some long-term memory loss and has had this worked up with an MRI of his brain. A concern this may be related to his statin therapy. We did recheck a 2-D echo which showed preserved LV function with mild progression of his aortic stenosis. Carotid Dopplers showed stable moderate right ICA stenosis. Since I saw him last 9 months ago he's remained critically stable. He had lower extremity Dopplers performed recently that did show progression of his left popliteal artery stenosis with a decline in his left ABI from 1-29. I cannot elicit symptoms of claudication however. He says several episodes of dizziness and saw Huey BienenstockBrian Hager PA-C for this. A. One month event monitor  was unrevealing. Carotid carotid Dopplers showed no progression of disease.    Current Outpatient Prescriptions  Medication Sig Dispense Refill  . aspirin EC 81 MG tablet Take 81 mg by mouth daily.      . clotrimazole-betamethasone  (LOTRISONE) cream Apply to affected area twice daily as needed for rash  15 g  2  . glucagon (GLUCAGON EMERGENCY) 1 MG injection Inject 1 mg into the muscle once as needed.  2 each  prn  . Insulin Human (INSULIN PUMP) 100 unit/ml SOLN Inject 1 each into the skin continuous. Novolog - ~50 units a day.  60 each  3  . levothyroxine (SYNTHROID, LEVOTHROID) 137 MCG tablet Take 1 tablet (137 mcg total) by mouth daily.  90 tablet  0  . Omega-3 Fatty Acids (FISH OIL) 500 MG CAPS Take 1 capsule by mouth daily.      . pravastatin (PRAVACHOL) 10 MG tablet Take 1 tablet (10 mg total) by mouth at bedtime.  30 tablet  5  . tadalafil (CIALIS) 20 MG tablet Take 20 mg by mouth daily as needed. For sexual activity       No current facility-administered medications for this visit.    Allergies  Allergen Reactions  . Statins     Problem with higher dosages (Lipitor caused memory issues)  . Tape Rash    Adhesive tape.  (Paper tape OK)    History   Social History  . Marital Status: Married    Spouse Name: N/A    Number of Children: N/A  . Years of Education: N/A   Occupational History  . Not on file.   Social History Main Topics  . Smoking status: Former Games developermoker  . Smokeless tobacco: Former NeurosurgeonUser    Quit date: 06/22/1987  . Alcohol Use: Yes  .  Drug Use: No  . Sexual Activity: Not on file   Other Topics Concern  . Not on file   Social History Narrative   Divorced x1, remarried.  Has 3 daughters from first marriage.   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   Distant tobacco abuse (35 pack-yr hx), quit in the late 1990s, no ETOH or drugs.   No exercise.     Review of Systems: General: negative for chills, fever, night sweats or weight changes.  Cardiovascular: negative for chest pain, dyspnea on exertion, edema, orthopnea, palpitations, paroxysmal nocturnal dyspnea or shortness of  breath Dermatological: negative for rash Respiratory: negative for cough or wheezing Urologic: negative for hematuria Abdominal: negative for nausea, vomiting, diarrhea, bright red blood per rectum, melena, or hematemesis Neurologic: negative for visual changes, syncope, or dizziness All other systems reviewed and are otherwise negative except as noted above.    Blood pressure 137/77, pulse 80, height 6\' 2"  (1.88 m), weight 218 lb 11.2 oz (99.202 kg).  General appearance: alert and no distress Neck: no adenopathy, no JVD, supple, symmetrical, trachea midline, thyroid not enlarged, symmetric, no tenderness/mass/nodules and soft bilateral carotid bruits Lungs: clear to auscultation bilaterally Heart: soft outflow tract murmur Extremities: extremities normal, atraumatic, no cyanosis or edema  EKG not performed today  ASSESSMENT AND PLAN:   Essential hypertension Controlled on current medications  Carotid stenosis, right Moderate right, mild left ICA stenosis by duplex ultrasound which follow on an annual basis. He is neurologically asymptomatic  Aortic stenosis, moderate Last 2-D echo performed December of last year showed normal LV function, aortic valve area of 1.2 cm,peak gradient of 30 mm of mercury, mean of 21. We will recheck this on an annual basis  CAD (coronary artery disease) Moderate coronary artery disease documented angiographically by Dr. Tresa EndoKelly to medically. The patient denies chest pain  Hyperlipidemia On low-dose statin therapy. He [facility] placed him on low-dose pravastatin. He admits to dietary indiscretion.      Runell GessJonathan J. Joee Iovine MD FACP,FACC,FAHA, [facility]SCAI 07/03/2013 4:22 PM

## 2013-07-03 NOTE — Assessment & Plan Note (Signed)
Moderate coronary artery disease documented angiographically by Dr. Tresa EndoKelly to medically. The patient denies chest pain

## 2013-07-03 NOTE — Assessment & Plan Note (Signed)
Controlled on current medications 

## 2013-07-03 NOTE — Assessment & Plan Note (Addendum)
Last 2-D echo performed December of last year showed normal LV function, aortic valve area of 1.2 cm,peak gradient of 30 mm of mercury, mean of 21. We will recheck this on an annual basis

## 2013-07-03 NOTE — Assessment & Plan Note (Signed)
Moderate right, mild left ICA stenosis by duplex ultrasound which follow on an annual basis. He is neurologically asymptomatic

## 2013-07-03 NOTE — Patient Instructions (Signed)
Your physician recommends that you schedule a follow-up appointment in: SIX MONTHS with Wilburt FinlayBryan Hager PA-C and ONE YEAR with Dr.Berry

## 2013-07-03 NOTE — Assessment & Plan Note (Signed)
On low-dose statin therapy. He has [facility] him on low-dose pravastatin. He admits to dietary indiscretion.

## 2013-07-17 ENCOUNTER — Ambulatory Visit (INDEPENDENT_AMBULATORY_CARE_PROVIDER_SITE_OTHER): Payer: PRIVATE HEALTH INSURANCE | Admitting: Family Medicine

## 2013-07-17 ENCOUNTER — Telehealth: Payer: Self-pay | Admitting: Family Medicine

## 2013-07-17 ENCOUNTER — Encounter: Payer: Self-pay | Admitting: Family Medicine

## 2013-07-17 VITALS — BP 138/79 | HR 78 | Temp 98.2°F | Resp 18 | Ht 74.0 in | Wt 218.0 lb

## 2013-07-17 DIAGNOSIS — E039 Hypothyroidism, unspecified: Secondary | ICD-10-CM

## 2013-07-17 DIAGNOSIS — R55 Syncope and collapse: Secondary | ICD-10-CM

## 2013-07-17 DIAGNOSIS — E1142 Type 2 diabetes mellitus with diabetic polyneuropathy: Secondary | ICD-10-CM

## 2013-07-17 DIAGNOSIS — E1049 Type 1 diabetes mellitus with other diabetic neurological complication: Secondary | ICD-10-CM

## 2013-07-17 DIAGNOSIS — E1042 Type 1 diabetes mellitus with diabetic polyneuropathy: Secondary | ICD-10-CM

## 2013-07-17 DIAGNOSIS — I10 Essential (primary) hypertension: Secondary | ICD-10-CM

## 2013-07-17 DIAGNOSIS — Z1211 Encounter for screening for malignant neoplasm of colon: Secondary | ICD-10-CM

## 2013-07-17 LAB — HEMOGLOBIN A1C: Hgb A1c MFr Bld: 9.6 % — ABNORMAL HIGH (ref 4.6–6.5)

## 2013-07-17 LAB — BASIC METABOLIC PANEL
BUN: 15 mg/dL (ref 6–23)
CALCIUM: 8.8 mg/dL (ref 8.4–10.5)
CHLORIDE: 100 meq/L (ref 96–112)
CO2: 30 meq/L (ref 19–32)
Creatinine, Ser: 0.9 mg/dL (ref 0.4–1.5)
GFR: 90.07 mL/min (ref >60.00)
GLUCOSE: 254 mg/dL — AB (ref 70–99)
POTASSIUM: 4.6 meq/L (ref 3.5–5.1)
SODIUM: 135 meq/L (ref 135–145)

## 2013-07-17 LAB — MICROALBUMIN / CREATININE URINE RATIO
CREATININE, U: 97 mg/dL
Microalb Creat Ratio: 2.5 mg/g (ref 0.0–30.0)
Microalb, Ur: 2.4 mg/dL — ABNORMAL HIGH (ref 0.0–1.9)

## 2013-07-17 LAB — TSH: TSH: 6.16 u[IU]/mL — ABNORMAL HIGH (ref 0.35–4.50)

## 2013-07-17 NOTE — Progress Notes (Signed)
Pre visit review using our clinic review tool, if applicable. No additional management support is needed unless otherwise documented below in the visit note. 

## 2013-07-17 NOTE — Patient Instructions (Signed)
Make appt with your endocrinologist, Dr. Elvera LennoxGherghe, in the next 4-6 weeks.

## 2013-07-17 NOTE — Telephone Encounter (Signed)
Relevant patient education assigned to patient using Emmi. ° °

## 2013-07-17 NOTE — Progress Notes (Signed)
OFFICE VISIT  07/17/2013   CC:  Chief Complaint  Patient presents with  . Follow-up    fasting   HPI:    Patient is a 63 y.o. Caucasian male who presents for f/u HTN and hyperlipidemia. His DM is followed by his endocrinologist, Dr. Cruzita Lederer. Saw lipidologist b/c of hx of statin intolerance (memory, energy, joints)--decided to try low dose pravastatin and says he just started it w/in the last week. Says he finished lamisil about 3 mo ago and he feels like he still has nail fungus. Says diet is going ok--"eating healthier" but sounds like nothing drastic.  Not exercising. Still hx of poor diabetic control --reviewed endo last note 03/2013.  Due for HbA1c and urine microalb/cr. Says most recent eye exam 12/2012 showed "no change"--has hx of diab retpthy.  No recent acute vision changes.  He had a routine/follow up urology appt last week and says prostate was fine.  Recent LE art doppler was ok given his lack of claudication sx's.  Dr.  Gwenlyn Found follows him for this. Has had presyncope problem that no cause was found for: he had cardiac monitoring that was unremarkable and repeat carotid dopplers that were stable compared to prev study--repeat 1 yr recommended by Dr. Gwenlyn Found.   Past Medical History  Diagnosis Date  . Diabetes mellitus Dx'd age 63    Novolog via insulin pump; sub-optimal control x years  . High cholesterol     Takes 1/2 of 19m atorvastatin daily  . Hypothyroidism     Thyroidectomy 2002; multinodular goiter  . Erectile dysfunction     [place]  . Diabetic retinopathy     Proliferative: Hx of retinal detachment on right-light perception only in right eye  . History of tobacco abuse     Quit about 1990 (has 35 pack-yr hx)  . OSA (obstructive sleep apnea)     06/2011 sleep study: moderate OSA, CPAP at 12 cm H2O.  . Impaired vision     right eye light perception only; hx of retinal detachment.  . Recurrent pneumonia   . GERD (gastroesophageal reflux disease)    . Venous insufficiency   . Diastolic dysfunction 24034   TEE with diastolic dysfunction, EF 774%  . Diabetic neuropathy     fine touch and position sense affected  . Aortic stenosis, mild Sept/Oct /2013    Mild/mod by echo; mild by cath 10/28/11.  Mild progression by echo 05/2012--f/u echo annually recommended (patient asymptomatic).  . Carotid stenosis, right 09/2011    50-70% stenosis, stable on repeat dopplers 05/2012 but left sided velocities increased 01/2013. Dr. BGwenlyn Foundsuggests f/u doppler in 6 mo.  . 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  Med mgmt recommended.  . Abnormal stress test 10/27/2011    lat isch--cardiac cath 10/28/2011  . Venous reflux 10/14/2011    venous doppler-R GSV continuous reflux throughout; too small for VNUS closure  . Decreased pedal pulses     LE doppler 11/08/11- no evidence of arterial insufficiency    Past Surgical History  Procedure Laterality Date  . Thyroid surgery  2002  . Lasix eye    . Eye surgery      Multiple laser surgeries for diabetic retinopathy, also cataract surgery [facility].  .Marland KitchenRetinal detachment surgery      Right eye  . Cataract extraction      left  .  Transthoracic echocardiogram  10/2011;12/2012    Mod AS, mild LVH, EF 60-65%, no RWMA-being followed by Dr. Gwenlyn Found  . Cardiac catheterization  10/2011    EF normal.  Diffuse 3 vessel CAD, no stents placed.  Medical mgmt per [facility].    Outpatient Prescriptions Prior to Visit  Medication Sig Dispense Refill  . aspirin EC 81 MG tablet Take 81 mg by mouth daily.      . clotrimazole-betamethasone (LOTRISONE) cream Apply to affected area twice daily as needed for rash  15 g  2  . Insulin Human (INSULIN PUMP) 100 unit/ml SOLN Inject 1 each into the skin continuous. Novolog - ~50 units a day.  60 each  3  . levothyroxine (SYNTHROID, LEVOTHROID) 137 MCG tablet Take 1 tablet (137 mcg  total) by mouth daily.  90 tablet  0  . Omega-3 Fatty Acids (FISH OIL) 500 MG CAPS Take 1 capsule by mouth daily.      . pravastatin (PRAVACHOL) 10 MG tablet Take 1 tablet (10 mg total) by mouth at bedtime.  30 tablet  5  . glucagon (GLUCAGON EMERGENCY) 1 MG injection Inject 1 mg into the muscle once as needed.  2 each  prn  . tadalafil (CIALIS) 20 MG tablet Take 20 mg by mouth daily as needed. For sexual activity       No facility-administered medications prior to visit.    Allergies  Allergen Reactions  . Statins     Problem with higher dosages (Lipitor caused memory issues)  . Tape Rash    Adhesive tape.  (Paper tape OK)   ROS As per HPI  PE: Blood pressure 138/79, pulse 78, temperature 98.2 F (36.8 C), temperature source Temporal, resp. rate 18, height 6' 2"  (1.88 m), weight 218 lb (98.884 kg), SpO2 97.00%. Gen: Alert, well appearing.  Patient is oriented to person, place, time, and situation. CV: RRR, 3/6 crescendo/decrescendo murmur.  No rub or gallop.  No diastolic murmur. Chest is clear, no wheezing or rales. Normal symmetric air entry throughout both lung fields. No chest wall deformities or tenderness. Toes: some thickening in great toenails but health nail about the prox 1/4 of nail, other nails normal.  LABS:  Lab Results  Component Value Date   HGBA1C 9.6* 01/15/2013   Lab Results  Component Value Date   CHOL 264* 05/14/2013   HDL 67 05/14/2013   LDLCALC 183* 05/14/2013   LDLDIRECT 159.5 04/21/2011   TRIG 68 05/14/2013   CHOLHDL 3.9 05/14/2013     Chemistry      Component Value Date/Time   NA 135 05/13/2013 0802   K 5.0 05/13/2013 0802   CL 99 05/13/2013 0802   CO2 28 05/13/2013 0802   BUN 15 05/13/2013 0802   CREATININE 1.04 05/13/2013 0802   CREATININE 1.0 05/08/2012 0907      Component Value Date/Time   CALCIUM 8.9 05/13/2013 0802   ALKPHOS 75 05/14/2013 0803   AST 18 05/14/2013 0803   ALT 16 05/14/2013 0803   BILITOT 0.8 05/14/2013 0803     Lab Results   Component Value Date   TSH 3.16 01/15/2013     IMPRESSION AND PLAN:  1) DM 1, poor control.  Managed per endo but pt often gets labs here b/c of infrequent/inconsistent endo f/u. Hba1c, urine microalb/cr today.  No changes in meds made today.  Encourage better TLC.  2) Hyperlipidemia: long hx of statin intol; just recently started prava at the urging of a lipidologist. We'll give  him a chance to see if he tolerates this before rechecking lipids.  3) HTN: stable off meds at this time.  4) Hypothyroidism: due for TSH recheck today.  5) Hx of onychomycosis: slowly resolving s/p lamisil PO treatment.  Pt reassured, emphasized patience with results as nails grow out.  6) hx of presyncope: resolved.  W/u unremarkable.  7) Colon ca screening: has once again declined colonoscopy but he agreed to iFOB today so kit was given.  An After Visit Summary was printed and given to the patient.  FOLLOW UP: 6 mo

## 2013-08-08 ENCOUNTER — Encounter: Payer: Self-pay | Admitting: Internal Medicine

## 2013-08-08 ENCOUNTER — Ambulatory Visit (INDEPENDENT_AMBULATORY_CARE_PROVIDER_SITE_OTHER): Payer: PRIVATE HEALTH INSURANCE | Admitting: Internal Medicine

## 2013-08-08 VITALS — BP 120/68 | HR 91 | Temp 97.8°F | Resp 12 | Wt 215.0 lb

## 2013-08-08 DIAGNOSIS — E1042 Type 1 diabetes mellitus with diabetic polyneuropathy: Secondary | ICD-10-CM

## 2013-08-08 DIAGNOSIS — E1142 Type 2 diabetes mellitus with diabetic polyneuropathy: Secondary | ICD-10-CM

## 2013-08-08 DIAGNOSIS — E1049 Type 1 diabetes mellitus with other diabetic neurological complication: Secondary | ICD-10-CM

## 2013-08-08 NOTE — Patient Instructions (Addendum)
Please change the following settings: - basal rate:  MN: 0.7 >> 0.55 2 am:  0.75 >> 0.55 7 am: 0.85   11 am: 0.85  - ICR: 14 >> 12   - ISF: MN: 30   11 am: 25 - target:  MN: 140 5:30 am: 120 >> 110  10 pm: 150 - IOB: 4 h   Please try not to take sugar at night.  Please return in 1 month with your sugar log.

## 2013-08-08 NOTE — Progress Notes (Signed)
Patient ID: [name], male   DOB: 05-15-1950, 63 y.o.   MRN: [id number]  HPI: [name] is a 63 y.o.-year-old male, returning for f/u for DM1, dx 63 (63 y/o), uncontrolled, with complications (CAD, proliferative diabetic retinopathy-history of retinal detachment OD, right carotid stenosis, PVD, peripheral neuropathy, ED). Last visit 4 mo ago.  Last hemoglobin A1c was: Lab Results  Component Value Date   HGBA1C 9.6* 07/17/2013   HGBA1C 9.6* 01/15/2013   HGBA1C 10.0* 05/08/2012   Pt is on an insulin pump: One Touch Ping ([employer]) with NovoLog.   Pump settings  - he did not change the settings as I suggested as he was afraid he would drop his sugars! He also takes sugar at night to avoid dropping sugars. - basal rate:  MN: 0.7 2 am:  0.75 7 am: 0.85 >> 0.9  (did not change as advised) 11 am: 0.85 >> 0.9 (did not change as advised) - ICR: 14 >> 12  (did not change as advised) - ISF: MN: 30   11 am: 25 - target:  MN: 140 5:30 am: 120 >> 110  (did not change as advised) 22: 150 - IOB: 4 h  TDD bolus 12-17 TDD basal 19 He boluses right before or right after he eats! Despite advice to bolus 15 min before.  He now uses the dual wave boluses after meeting with Cristy Folks for DM education.   He takes 2 tablespoons of sugar at night  Pt checks his sugars 3-4 a day and they are: - am: 150-250 >> 110-250s (yesterday: 55) >> 150-200 >> 98-160-261 - 2h after b'fast: n/c >> 191-284 - before lunch: 180-275 >>  200s >> 175-250 >> 125-230-291 - 2h after lunch: 295 x 1 >> 65 x1, 281, 410 x1 - before dinner: 180-275 >> 200s >> 175-250, few 300s >> 73-170-251 - bedtime: can go to 300s >> 300-400s >> 112-450 >> 84-315 - nighttime: 66 + lows. Lowest sugar was 65, 66  he has hypoglycemia awareness at 100. Highest: 315.    He has a new meter: [employer].  Pt's meals are: - Breakfast: 1.5 scramble eggs, toast, milk - 65 g carbs - Lunch: sandwich, shaved chicken or [place] breast, cheese,  mayo, chips, soda - 100 carbs - Dinner: meat, vegetables, bread, fruit, milk - 100 g carbs - Snacks: 2-3 a day: bagel midmorning, evening snack: chips, popcorn  Pt does not have chronic kidney disease, last BUN/creatinine was:  Lab Results  Component Value Date   BUN 15 07/17/2013   CREATININE 0.9 07/17/2013  Last microalbumin/creatinine ratio was 3.8.  Last set of lipids: Lab Results  Component Value Date   CHOL 264* 05/14/2013   HDL 67 05/14/2013   LDLCALC 183* 05/14/2013   LDLDIRECT 159.5 04/21/2011   TRIG 68 05/14/2013   CHOLHDL 3.9 05/14/2013  He is not on a statin, this is listed as an intolerance. Pt's last eye exam was in 12/2012. + DR - OD, s/p detached retina, but stable. Has numbness and tingling in his legs.  He also has a history of hyperlipidemia, disequilibrium, mild cognitive impairment, hypothyroidism-status post thyroidectomy for multinodular goiter - last TSH in 01/2013, normal. He also has OSA-on CPAP, GERD.  I reviewed pt's medications, allergies, PMH, social hx, family hx and no changes required, except as mentioned above.  ROS: Constitutional: no weight gain, no fatigue, no subjective hyperthermia/hypothermia Eyes: no blurry vision, no xerophthalmia ENT: no sore throat, no nodules palpated in throat, no dysphagia/odynophagia, no hoarseness  Cardiovascular: no CP/SOB/palpitations/+ leg swelling - wears compression hoses Respiratory: no cough/SOB Gastrointestinal: no N/V/D/C Musculoskeletal: no muscle aches/+ joint aches Skin: no rashes Neurological: no tremors/numbness/tingling/dizziness  PE: BP 120/68  Pulse 91  Temp(Src) 97.8 F (36.6 C) (Oral)  Resp 12  Wt 215 lb (97.523 kg)  SpO2 95% Wt Readings from Last 3 Encounters:  08/08/13 215 lb (97.523 kg)  07/17/13 218 lb (98.884 kg)  07/03/13 218 lb 11.2 oz (99.202 kg)  Body mass index is 27.59 kg/(m^2).  Constitutional: slightly overweight, in NAD Eyes: PERRLA, EOMI, no exophthalmos ENT: moist mucous  membranes, no thyromegaly, no cervical lymphadenopathy Cardiovascular: RRR, + 2/6 SEM, no RG Respiratory: CTA B Gastrointestinal: abdomen soft, NT, ND, BS+ Musculoskeletal: no deformities, strength intact in all 4 Skin: moist, warm, no rashes  ASSESSMENT: 1. 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  Patient with long-standing type 1 diabetes, poorly controlled, with multiple complications, on an insulin pump, however without good understanding of the pump principles and settings. He did not implement the changes that I suggested as he was afraid he would drop his sugars at night. He also takes 2 tablespoons of sugar at night even if he is in the 190s.  - at last visit I advised him to move his bolusing 15 minutes before he starts eating >> he is doing this now - I advised him to change the following; Patient Instructions  Please change the following settings: - basal rate:  MN: 0.7 >> 0.55 2 am:  0.75 >> 0.55 7 am: 0.85   11 am: 0.85  - ICR: 14 >> 12   - ISF: MN: 30   11 am: 25 - target:  MN: 140 5:30 am: 120 >> 110  10 pm: 150 - IOB: 4 h   Please try not to take sugar at night.  Please return in 1 month with your sugar log.  - I referred him to diabetes education. He saw [name] once, did not return for f/u after 2 weeks as advised. He needs hep with basal rate and ICR validation. - I will see him in 1 mo

## 2013-08-09 ENCOUNTER — Other Ambulatory Visit: Payer: Self-pay | Admitting: Family Medicine

## 2013-08-26 ENCOUNTER — Other Ambulatory Visit: Payer: PRIVATE HEALTH INSURANCE

## 2013-08-27 ENCOUNTER — Ambulatory Visit: Payer: PRIVATE HEALTH INSURANCE | Admitting: Pharmacist

## 2013-09-16 ENCOUNTER — Encounter: Payer: PRIVATE HEALTH INSURANCE | Attending: Internal Medicine | Admitting: Nutrition

## 2013-09-16 DIAGNOSIS — E1042 Type 1 diabetes mellitus with diabetic polyneuropathy: Secondary | ICD-10-CM

## 2013-09-16 DIAGNOSIS — E1049 Type 1 diabetes mellitus with other diabetic neurological complication: Secondary | ICD-10-CM | POA: Diagnosis not present

## 2013-09-16 DIAGNOSIS — E1142 Type 2 diabetes mellitus with diabetic polyneuropathy: Secondary | ICD-10-CM | POA: Diagnosis not present

## 2013-09-16 DIAGNOSIS — Z794 Long term (current) use of insulin: Secondary | ICD-10-CM | POA: Insufficient documentation

## 2013-09-16 DIAGNOSIS — Z713 Dietary counseling and surveillance: Secondary | ICD-10-CM | POA: Diagnosis not present

## 2013-09-16 NOTE — Progress Notes (Signed)
Patient is here today because his Hgb A 1C is over 9%.  He says it is because he is not very diligent in taking care of himself.   Problems identified: 1.  Drinking sweet drinks 2.  Not rotating sites appropriately, and using scared sites for infusion set sites. 3.  Taking insulin after meals, or not at all because he is forgetting.  Says he "forgets people's names and words he wants to say more frequently now", He says that he will ususally take his insulin at work after lunch, and sometimes is call back to work quickly, and forgets to take his insulin for that meal--"more than Half the time". 4.  Has not made changes in his basal rates per Dr. Charlean Sanfilippo order because he knows that if he starts taking better care of his diabetes, he will drop low. 5.  Is worried about dying of heart disease.  Family history of heart attacks at age 72--and he is now 63 yo.   6.  He thinks that his basal rates are not right and wants to know how to know if they are set correctly.  Plan:   1.  Stop the sweet drinks.  Explained to him that sweet drinks raise blood sugar so quickly, that the insulin he takes for this, doesn't work fast enough, causing his blood sugars to rise higher, making his insulin less effective--requiring more insulin, resulting in weight gain.  He admits he is gaining more weight.  Also told him the this will raise his triglycerides-cholesterol causing blockages.  He is at 75% blockage of aortic,and 70% 2 other major vessels.   2. He was shown different sites to use in upper buttocks, side of hips, and inner thighs.   3.  Discussed that the insulin dose not begin to work for 15-20 minutes after he takes it, and by then, his blood sugar has risen from his meal, and the higher his blood sugar goes, the less potent the insulin gets.  He agreed to take it before he eats.   4.  I explained that we test basal rates during the night first, and to do that requires a reading at bedtime, 3AM and morning.  He  agreed to do this and call results to me in the AM.

## 2013-09-17 ENCOUNTER — Telehealth: Payer: Self-pay | Admitting: Nutrition

## 2013-09-17 NOTE — Patient Instructions (Signed)
1.  Stop drinking sweet drinks 2.  Take insulin before all meals. 3. Test blood sugars before meals and at bedtime 4.  Do a night time basal test:  Test at bedtime (4 hours after last bolus, at 3AM and again in the AM)  Call those results.

## 2013-09-17 NOTE — Telephone Encounter (Signed)
Phone call from patient.  He tested his night time basal insulin:  MN: ( 4.5 hours after last bolus): 208,   3AM: 156, 7AM: 192 Blood sugars dropping 52 points from MN to 3AM, and rising 36 points from 3AM to 7AM:   Current basal rates:  MN: 0.55,  2AM: 0.50,  7AM: 0.85,  11AM: 0.85,   5PM: 0.60,  11PM: 0.50 PLan: Change  Basal rates:  MN: 0.45,  2AM: 0.60,  7AM: 0.85,  11AM: 0.85   5PM: 0.60,  11PM: 0.40  Do another night time basal testing at HS, 3AM, and 7AM  And call results in the AM.

## 2013-09-18 ENCOUNTER — Telehealth: Payer: Self-pay | Admitting: Nutrition

## 2013-09-18 NOTE — Telephone Encounter (Signed)
Reviewed. No change. Thank you, Bonita Quin.

## 2013-09-18 NOTE — Telephone Encounter (Signed)
Patient reports that he ate a large cheeseburger for supper and had water to drink.  Blood sugar at HS: 11PM: (4 hrs. After supper bolus): 157                                  4AM: 189,                                      6AM: 205 Believe blood sugar rise at 4AM is due to high fat supper meal with no additional insulin.   Plan:  Increase basal rate from 2AM to 6AM by 0.05u/hr  Or 0.65 Pt. Will retest and call me the results.   He reported that he would normally eat something when he goes to bed with a blood sugar of 157.  Discussed the fact that he should not have to eat something, and that this was why we are doing this.  To let him know that his blood sugars will not drop during the night.  Goal is to have blood sugars fluxuate less than 25 points from HS to morning.  Discussed the need to not eat high fat tonight.  He agreed to do this.  Pt. Very appreciative of the attention to this matter.

## 2013-09-24 ENCOUNTER — Ambulatory Visit: Payer: PRIVATE HEALTH INSURANCE | Admitting: Internal Medicine

## 2013-10-17 ENCOUNTER — Ambulatory Visit: Payer: PRIVATE HEALTH INSURANCE | Admitting: Internal Medicine

## 2013-10-28 ENCOUNTER — Telehealth: Payer: Self-pay | Admitting: Family Medicine

## 2013-10-28 NOTE — Telephone Encounter (Signed)
[facility] for all CPAP supplies and it asks me to LIST them on the rx. Pls call patient and get him to list every supply that he needs  (such as mask, headgear, cushions, tubing, filters, water chamber, etc).  Also, pls call [facility] and tell them this patient suspects that his pressure is set too high.   Ask if we can get a tech to check on this or see what I need to order so that we can confirm that he is on the right pressure.--thx!

## 2013-10-29 NOTE — Telephone Encounter (Signed)
Sent Henderson NewcomerMelissa Stenson staff message to find out what we need to do to get tech out to patient's house to check pressure.  Also, left detailed message on pt's phone [facility] needed for [facility].

## 2013-10-29 NOTE — Telephone Encounter (Signed)
Patient states he needs all supplies listed plus chin strap and nose pillows.

## 2013-10-30 NOTE — Telephone Encounter (Signed)
As per CMA, Faythe GheeLisa Albright, [facility] will take care of the pressure issue by sending a tech to pt's home. I wrote rx for all the supplies needed.

## 2013-11-07 ENCOUNTER — Telehealth: Payer: Self-pay | Admitting: Family Medicine

## 2013-11-07 NOTE — Telephone Encounter (Signed)
Sent staff message to Henderson [facility]com[facility]issa [facility] at [facility] stating : "We received the compliance report for patient and Dr. Milinda CaveMcGowen would like to change pressure to 5CM oxygen and then get a repeat compliance report in one month.    The paper that we received did not have anywhere to fax back these orders. Please let me know if this will work.   Thanks,  Misty StanleyLisa, CMA"  I'm not awaiting her response.

## 2013-11-07 NOTE — Telephone Encounter (Signed)
Pls call pt's resp therapy/CPAP provider and recommend that pressure be decreased to 5 cm H2O and repeat the compliance report in 1 month.-thx

## 2013-11-08 NOTE — Telephone Encounter (Signed)
Staff message from [employer]. [employer] Penney[employer]issa Stenson: "Got it [place]. In the future, if you want to put instructions like this on a referral order then In Basket me to let me know it's there, that will work too. That way the order will be electronically signed by the MD.   I'm forwarding this message to our respiratory department so this will work also.   Melissa"

## 2013-11-12 ENCOUNTER — Ambulatory Visit (INDEPENDENT_AMBULATORY_CARE_PROVIDER_SITE_OTHER): Payer: PRIVATE HEALTH INSURANCE | Admitting: Internal Medicine

## 2013-11-12 ENCOUNTER — Encounter: Payer: Self-pay | Admitting: Internal Medicine

## 2013-11-12 VITALS — BP 138/70 | HR 80 | Temp 97.5°F | Resp 12 | Wt 220.0 lb

## 2013-11-12 DIAGNOSIS — G990 Autonomic neuropathy in diseases classified elsewhere: Secondary | ICD-10-CM

## 2013-11-12 DIAGNOSIS — E1042 Type 1 diabetes mellitus with diabetic polyneuropathy: Secondary | ICD-10-CM

## 2013-11-12 LAB — HEMOGLOBIN A1C: Hgb A1c MFr Bld: 8.9 % — ABNORMAL HIGH (ref 4.6–6.5)

## 2013-11-12 NOTE — Patient Instructions (Signed)
Please change the pump settings as follows: - basal rate:  MN: 0.45 2 am: 0.65 7 am: 0.85 >> 0.95 11 am: 0.85 >> 0.95 5 PM: 0.60 >> 0.75 11pm 0.40 >> 0.50 - ICR:  12 am: 15 6 am: 12 10 am 14 >> 12 4 pm: 14 >> 12 - ISF:   MN: 30   11 am: 25 - target:  MN: 140 5:30 am: 120 >> 110  10 pm: 150 >> 140 - IOB: 4 h   Please stop at the lab. Please continue to see [name] Please return in 3 months.

## 2013-11-12 NOTE — Progress Notes (Signed)
Patient ID: [name], male   DOB: 04/30/1950, 63 y.o.   MRN: [id number]  HPI: [name] is a 63 y.o.-year-old male, returning for f/u for DM1, dx [id number] 62(63 y/o), uncontrolled, with complications (CAD, proliferative diabetic retinopathy-history of retinal detachment OD, right carotid stenosis, PVD, peripheral neuropathy, ED). Last visit 3 mo ago.  Last hemoglobin A1c was: Lab Results  Component Value Date   HGBA1C 9.6* 07/17/2013   HGBA1C 9.6* 01/15/2013   HGBA1C 10.0* 05/08/2012   Pt is on an insulin pump: One Touch Ping (Animas) with NovoLog. He got a replacement pump b/c errors in the old pump.   Pump settings  - he did not change the settings as I suggested as he was afraid he would drop his sugars! He also takes sugar at night to avoid dropping sugars. - basal rate:  MN: 0.7 >> 0.55 >> 0.45 2 am:  0.75 >> 0.55 >> 0.65 7 am: 0.85  11 am: 0.85  5 PM: 0.60 11pm: 0.40 - ICR: 14 >> 12 >> 12 am: 15 10 am 14 4 pm: 14 - ISF:   MN: 30   11 am: 25 - target:  MN: 140 5:30 am: 120 >> 110  10 pm: 150 - IOB: 4 h  He boluses right before or right after he eats! Despite advice to bolus 15 min before.  He now uses the dual wave boluses after meeting with [name] for DM education.   Pt checks his sugars 3-4 a day and they are worse!: - am: 150-250 >> 110-250s (yesterday: 55) >> 150-200 >> 98-160-261 >> 56-180, 236  - 2h after b'fast: n/c >> 191-284 >> 190, 441 - before lunch: 180-275 >>  200s >> 175-250 >> 125-230-291 >> 170-311 - 2h after lunch: 295 x 1 >> 65 x1, 281, 410 x1 >> 197-301 - before dinner: 180-275 >> 200s >> 175-250, few 300s >> 73-170-251 >> 190-466 - bedtime: can go to 300s >> 300-400s >> 112-450 >> 84-315 >> 125-391 - nighttime: 66 >> 56, 190-407 + few lows. Lowest sugar was 65, 66 >> 54 (after correction of a high); he has hypoglycemia awareness at 100. Highest: 315 >> 400 (pizza) - also changed the site after this.    He has a new meter:  [employer].  Pt's meals are: - Breakfast: 1.5 scramble eggs, toast, milk - 65 g carbs - Lunch: sandwich, shaved chicken or Mal[place]urkey breast, cheese, mayo, chips, soda - 100 carbs - Dinner: meat, vegetables, bread, fruit, milk - 100 g carbs - Snacks: 2-3 a day: bagel midmorning, evening snack: chips, popcorn  Pt does not have chronic kidney disease, last BUN/creatinine was:  Lab Results  Component Value Date   BUN 15 07/17/2013   CREATININE 0.9 07/17/2013  Last microalbumin/creatinine ratio was 3.8.  Last set of lipids: Lab Results  Component Value Date   CHOL 264* 05/14/2013   HDL 67 05/14/2013   LDLCALC 183* 05/14/2013   LDLDIRECT 159.5 04/21/2011   TRIG 68 05/14/2013   CHOLHDL 3.9 05/14/2013  He is not on a statin, this is listed as an intolerance. Pt's last eye exam was in 12/2012. + DR - OD, s/p detached retina, but stable. Has numbness and tingling in his legs.  He also has a history of hyperlipidemia, disequilibrium, mild cognitive impairment, hypothyroidism-status post thyroidectomy for multinodular goiter - last TSH in 01/2013, normal. He also has OSA-on CPAP, GERD.  I reviewed pt's medications, allergies, PMH, social hx, family hx and no changes required,  except as mentioned above.  ROS: Constitutional: no weight gain, no fatigue, no subjective hyperthermia/hypothermia Eyes: no blurry vision, no xerophthalmia ENT: no sore throat, no nodules palpated in throat, no dysphagia/odynophagia, no hoarseness Cardiovascular: no CP/SOB/palpitations/+ leg swelling - wears compression hoses Respiratory: no cough/SOB Gastrointestinal: no N/V/D/C Musculoskeletal: no muscle aches/+ joint aches Skin: no rashes Neurological: no tremors/numbness/tingling/dizziness  PE: BP 138/70 mmHg  Pulse 80  Temp(Src) 97.5 F (36.4 C) (Oral)  Resp 12  Wt 220 lb (99.791 kg)  SpO2 95% Wt Readings from Last 3 Encounters:  11/12/13 220 lb (99.791 kg)  08/08/13 215 lb (97.523 kg)  07/17/13 218 lb  (98.884 kg)  Body mass index is 28.23 kg/(m^2).  Constitutional: slightly overweight, in NAD Eyes: PERRLA, EOMI, no exophthalmos ENT: moist mucous membranes, no thyromegaly, no cervical lymphadenopathy Cardiovascular: RRR, + 2/6 SEM, no RG Respiratory: CTA B Gastrointestinal: abdomen soft, NT, ND, BS+ Musculoskeletal: no deformities, strength intact in all 4 Skin: moist, warm, no rashes  ASSESSMENT: 1. 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  Patient with long-standing type 1 diabetes, poorly controlled, with multiple complications, on an insulin pump, however without good understanding of the pump principles and settings. He is also working with [name] (DM education). - his sugars are better in am but increasing throughout the day - he is working on trying new pump sites: hip, arm as he had some scarring on his abdomen - at last visits I advised him to move his bolusing 15 minutes before he starts eating >> he is doing this now (mostly) - I advised him to change the following; Patient Instructions  Please change the pump settings as follows: - basal rate:  MN: 0.45 2 am: 0.65 7 am: 0.85 >> 0.95 11 am: 0.85 >> 0.95 5 PM: 0.60 >> 0.75 11pm 0.40 >> 0.50 - ICR:  12 am: 15 6 am: 12 10 am 14 >> 12 4 pm: 14 >> 12 - ISF:   MN: 30   11 am: 25 - target:  MN: 140 5:30 am: 120 >> 110  10 pm: 150 >> 140 - IOB: 4 h   Please stop at the lab. Please continue to see [name] Please return in 3 months. . - refuses flu vaccine today  - I will see him in 1 mo - will check a hemoglobin A1c today  - time spent with the patient: 40 min, of which >50% was spent in reviewing his pump and meter downloads, discussing  his hypo- and hyper-glycemic episodes, reviewing his previous labs and pump settings and developing a plan to avoid hypo- an hyper-glycemia.   Office Visit on 11/12/2013  Component Date Value Ref Range Status  . Hgb A1c MFr Bld 11/12/2013 8.9* 4.6 - 6.5 % Final   Glycemic Control Guidelines for People with Diabetes:Non Diabetic:  <6%Goal of Therapy: <7%Additional Action Suggested:  >8%    HbA1c is better!

## 2013-11-20 NOTE — Telephone Encounter (Signed)
New rx written and I put it on your keyboard.  Pls fax to his resp therapy/HH agency.-thx

## 2013-11-20 NOTE — Telephone Encounter (Signed)
Pt called back and needs another order for his CPap machine. He states his wife haqs noticed him snoring more and thinks 5CM is too low for him. Can we send an order to try 6 or 7Cms? Please advise.

## 2013-11-21 NOTE — Telephone Encounter (Signed)
Lisa faxed over order.

## 2013-11-25 ENCOUNTER — Telehealth (HOSPITAL_COMMUNITY): Payer: Self-pay | Admitting: *Deleted

## 2013-11-25 NOTE — Telephone Encounter (Signed)
Patient states they adjusted his CPAP from 5 to 6 on Friday 11/22/13. He feels like 6 is too low. Can orders be sent in to change it to 7?

## 2013-11-26 NOTE — Telephone Encounter (Signed)
Rx order faxed.  I also sent a staff message to Henderson NewcomerMelissa Stenson with [facility].

## 2013-11-26 NOTE — Telephone Encounter (Signed)
OK, will write order to change to 7 cm H2O..[name]

## 2013-11-26 NOTE — Telephone Encounter (Signed)
Left detailed message on pt's cell 

## 2013-11-27 ENCOUNTER — Telehealth (HOSPITAL_COMMUNITY): Payer: Self-pay | Admitting: *Deleted

## 2013-12-02 ENCOUNTER — Other Ambulatory Visit (HOSPITAL_COMMUNITY): Payer: Self-pay | Admitting: Cardiovascular Disease

## 2013-12-02 DIAGNOSIS — I359 Nonrheumatic aortic valve disorder, unspecified: Secondary | ICD-10-CM

## 2013-12-12 ENCOUNTER — Encounter (HOSPITAL_COMMUNITY): Payer: Self-pay | Admitting: Cardiovascular Disease

## 2013-12-16 ENCOUNTER — Other Ambulatory Visit (HOSPITAL_COMMUNITY): Payer: Self-pay | Admitting: Cardiovascular Disease

## 2013-12-16 DIAGNOSIS — I739 Peripheral vascular disease, unspecified: Secondary | ICD-10-CM

## 2013-12-18 ENCOUNTER — Ambulatory Visit (HOSPITAL_COMMUNITY): Payer: PRIVATE HEALTH INSURANCE

## 2013-12-18 ENCOUNTER — Encounter (HOSPITAL_COMMUNITY): Payer: PRIVATE HEALTH INSURANCE

## 2014-01-06 ENCOUNTER — Other Ambulatory Visit: Payer: Self-pay | Admitting: Family Medicine

## 2014-01-06 MED ORDER — CLOTRIMAZOLE-BETAMETHASONE 1-0.05 % EX CREA: TOPICAL_CREAM | CUTANEOUS | Status: DC

## 2014-01-07 ENCOUNTER — Ambulatory Visit (HOSPITAL_COMMUNITY)
Admission: RE | Admit: 2014-01-07 | Discharge: 2014-01-07 | Disposition: A | Discharge status: Home / Self Care | Payer: PRIVATE HEALTH INSURANCE | Source: Ambulatory Visit | Attending: Cardiovascular Disease | Admitting: Cardiovascular Disease

## 2014-01-07 DIAGNOSIS — I517 Cardiomegaly: Secondary | ICD-10-CM

## 2014-01-07 DIAGNOSIS — I359 Nonrheumatic aortic valve disorder, unspecified: Secondary | ICD-10-CM | POA: Insufficient documentation

## 2014-01-07 DIAGNOSIS — I739 Peripheral vascular disease, unspecified: Secondary | ICD-10-CM | POA: Insufficient documentation

## 2014-01-07 NOTE — Progress Notes (Signed)
Lower Extremity Arterial Duplex Completed. °Brianna L Mazza,RVT °

## 2014-01-07 NOTE — Progress Notes (Signed)
2D Echo Performed 01/07/2014    Reeve Turnley, RCS  

## 2014-01-12 ENCOUNTER — Inpatient Hospital Stay (HOSPITAL_BASED_OUTPATIENT_CLINIC_OR_DEPARTMENT_OTHER)
Admission: EM | Admit: 2014-01-12 | Discharge: 2014-01-17 | DRG: 039 | Disposition: A | Discharge status: Home / Self Care | Payer: PRIVATE HEALTH INSURANCE | Attending: Internal Medicine | Admitting: Internal Medicine

## 2014-01-12 ENCOUNTER — Encounter (HOSPITAL_BASED_OUTPATIENT_CLINIC_OR_DEPARTMENT_OTHER): Payer: Self-pay | Admitting: *Deleted

## 2014-01-12 ENCOUNTER — Emergency Department (HOSPITAL_BASED_OUTPATIENT_CLINIC_OR_DEPARTMENT_OTHER): Payer: PRIVATE HEALTH INSURANCE

## 2014-01-12 DIAGNOSIS — Z9641 Presence of insulin pump (external) (internal): Secondary | ICD-10-CM | POA: Diagnosis present

## 2014-01-12 DIAGNOSIS — G459 Transient cerebral ischemic attack, unspecified: Secondary | ICD-10-CM | POA: Diagnosis present

## 2014-01-12 DIAGNOSIS — E785 Hyperlipidemia, unspecified: Secondary | ICD-10-CM | POA: Diagnosis present

## 2014-01-12 DIAGNOSIS — Z7982 Long term (current) use of aspirin: Secondary | ICD-10-CM

## 2014-01-12 DIAGNOSIS — I10 Essential (primary) hypertension: Secondary | ICD-10-CM | POA: Diagnosis present

## 2014-01-12 DIAGNOSIS — R4781 Slurred speech: Secondary | ICD-10-CM | POA: Diagnosis present

## 2014-01-12 DIAGNOSIS — E890 Postprocedural hypothyroidism: Secondary | ICD-10-CM | POA: Diagnosis present

## 2014-01-12 DIAGNOSIS — E1059 Type 1 diabetes mellitus with other circulatory complications: Secondary | ICD-10-CM | POA: Diagnosis present

## 2014-01-12 DIAGNOSIS — R200 Anesthesia of skin: Secondary | ICD-10-CM | POA: Diagnosis not present

## 2014-01-12 DIAGNOSIS — I639 Cerebral infarction, unspecified: Secondary | ICD-10-CM

## 2014-01-12 DIAGNOSIS — I6521 Occlusion and stenosis of right carotid artery: Secondary | ICD-10-CM | POA: Diagnosis not present

## 2014-01-12 DIAGNOSIS — E10319 Type 1 diabetes mellitus with unspecified diabetic retinopathy without macular edema: Secondary | ICD-10-CM | POA: Diagnosis present

## 2014-01-12 DIAGNOSIS — G4733 Obstructive sleep apnea (adult) (pediatric): Secondary | ICD-10-CM | POA: Diagnosis present

## 2014-01-12 DIAGNOSIS — Z6827 Body mass index (BMI) 27.0-27.9, adult: Secondary | ICD-10-CM

## 2014-01-12 DIAGNOSIS — E1065 Type 1 diabetes mellitus with hyperglycemia: Secondary | ICD-10-CM | POA: Diagnosis present

## 2014-01-12 DIAGNOSIS — E663 Overweight: Secondary | ICD-10-CM | POA: Diagnosis present

## 2014-01-12 DIAGNOSIS — R739 Hyperglycemia, unspecified: Secondary | ICD-10-CM

## 2014-01-12 DIAGNOSIS — I959 Hypotension, unspecified: Secondary | ICD-10-CM | POA: Diagnosis not present

## 2014-01-12 DIAGNOSIS — Z87891 Personal history of nicotine dependence: Secondary | ICD-10-CM

## 2014-01-12 DIAGNOSIS — I6529 Occlusion and stenosis of unspecified carotid artery: Secondary | ICD-10-CM | POA: Insufficient documentation

## 2014-01-12 DIAGNOSIS — I350 Nonrheumatic aortic (valve) stenosis: Secondary | ICD-10-CM

## 2014-01-12 DIAGNOSIS — I2510 Atherosclerotic heart disease of native coronary artery without angina pectoris: Secondary | ICD-10-CM | POA: Diagnosis present

## 2014-01-12 DIAGNOSIS — K219 Gastro-esophageal reflux disease without esophagitis: Secondary | ICD-10-CM | POA: Diagnosis present

## 2014-01-12 LAB — RAPID URINE DRUG SCREEN, HOSP PERFORMED
Amphetamines: NOT DETECTED
Barbiturates: NOT DETECTED
Benzodiazepines: NOT DETECTED
Cocaine: NOT DETECTED
Opiates: NOT DETECTED
TETRAHYDROCANNABINOL: NOT DETECTED

## 2014-01-12 LAB — DIFFERENTIAL
BASOS ABS: 0 10*3/uL (ref 0.0–0.1)
BASOS PCT: 0 % (ref 0–1)
Eosinophils Absolute: 0.2 10*3/uL (ref 0.0–0.7)
Eosinophils Relative: 4 % (ref 0–5)
LYMPHS ABS: 2 10*3/uL (ref 0.7–4.0)
Lymphocytes Relative: 28 % (ref 12–46)
Monocytes Absolute: 0.5 10*3/uL (ref 0.1–1.0)
Monocytes Relative: 8 % (ref 3–12)
NEUTROS PCT: 60 % (ref 43–77)
Neutro Abs: 4.1 10*3/uL (ref 1.7–7.7)

## 2014-01-12 LAB — CBC
HCT: 40.1 % (ref 39.0–52.0)
Hemoglobin: 14.3 g/dL (ref 13.0–17.0)
MCH: 30.9 pg (ref 26.0–34.0)
MCHC: 35.7 g/dL (ref 30.0–36.0)
MCV: 86.6 fL (ref 78.0–100.0)
PLATELETS: 213 10*3/uL (ref 150–400)
RBC: 4.63 MIL/uL (ref 4.22–5.81)
RDW: 12.5 % (ref 11.5–15.5)
WBC: 6.9 10*3/uL (ref 4.0–10.5)

## 2014-01-12 LAB — COMPREHENSIVE METABOLIC PANEL
ALBUMIN: 3.8 g/dL (ref 3.5–5.2)
ALT: 18 U/L (ref 0–53)
AST: 23 U/L (ref 0–37)
Alkaline Phosphatase: 81 U/L (ref 39–117)
Anion gap: 10 (ref 5–15)
BUN: 21 mg/dL (ref 6–23)
CO2: 24 mmol/L (ref 19–32)
Calcium: 8.9 mg/dL (ref 8.4–10.5)
Chloride: 99 mEq/L (ref 96–112)
Creatinine, Ser: 1.01 mg/dL (ref 0.50–1.35)
GFR calc non Af Amer: 76 mL/min — ABNORMAL LOW (ref >90)
GFR, EST AFRICAN AMERICAN: 88 mL/min — AB (ref >90)
Glucose, Bld: 286 mg/dL — ABNORMAL HIGH (ref 70–99)
Potassium: 4.1 mmol/L (ref 3.5–5.1)
SODIUM: 133 mmol/L — AB (ref 135–145)
TOTAL PROTEIN: 7 g/dL (ref 6.0–8.3)
Total Bilirubin: 0.5 mg/dL (ref 0.3–1.2)

## 2014-01-12 LAB — URINE MICROSCOPIC-ADD ON

## 2014-01-12 LAB — URINALYSIS, ROUTINE W REFLEX MICROSCOPIC
Bilirubin Urine: NEGATIVE
Glucose, UA: >1000 mg/dL — AB
Hgb urine dipstick: NEGATIVE
KETONES UR: NEGATIVE mg/dL
LEUKOCYTES UA: NEGATIVE
Nitrite: NEGATIVE
Protein, ur: NEGATIVE mg/dL
Specific Gravity, Urine: 1.029 (ref 1.005–1.030)
Urobilinogen, UA: 1 mg/dL (ref 0.0–1.0)
pH: 5.5 (ref 5.0–8.0)

## 2014-01-12 LAB — CBG MONITORING, ED
Glucose-Capillary: 259 mg/dL — ABNORMAL HIGH (ref 70–99)
Glucose-Capillary: 351 mg/dL — ABNORMAL HIGH (ref 70–99)

## 2014-01-12 LAB — APTT: aPTT: 26 seconds (ref 24–37)

## 2014-01-12 LAB — TROPONIN I

## 2014-01-12 LAB — PROTIME-INR
INR: 0.9 (ref 0.00–1.49)
Prothrombin Time: 12.2 seconds (ref 11.6–15.2)

## 2014-01-12 LAB — ETHANOL: Alcohol, Ethyl (B): <5 mg/dL (ref 0–9)

## 2014-01-12 MED ORDER — INSULIN REGULAR HUMAN 100 UNIT/ML IJ SOLN
5.0000 [IU] | Freq: Once | INTRAMUSCULAR | Status: AC
  Administered 2014-01-12: 100 [IU] via SUBCUTANEOUS
  Filled 2014-01-12: qty 1

## 2014-01-12 MED ORDER — ONDANSETRON HCL 4 MG/2ML IJ SOLN: 4.0000 mg | Freq: Three times a day (TID) | INTRAMUSCULAR | Status: AC | PRN

## 2014-01-12 NOTE — ED Notes (Signed)
No changes. [facility] eating[facility]

## 2014-01-12 NOTE — ED Notes (Signed)
Pt just ate evening meal, alert, NAD, calm, interactive, no changes.

## 2014-01-12 NOTE — ED Provider Notes (Signed)
CSN: [id number]     Arrival date & time 01/12/14  1603 History  This chart was scribed for Nathaniel RollerBrian D Idrees Quam, MD by Evon Slackerrance Branch, ED Scribe. This patient was seen in room MHT14/MHT14 and the patient's care was started at 4:17 PM.      Chief Complaint  Patient presents with  . Numbness   The history is provided by the patient. No language interpreter was used.   HPI Comments: Nathaniel DemarkRichard Carter is a 64 y.o. male with PMHx of diabetes, high cholesterol, carotid stenosis and aortic stenosis who presents to the Emergency Department complaining of resolved sudden left arm numbness onset today at 11 AM. Pt describes the feeling as "weird and uncomfortable " but was still able to use the arm. Pt states PTA at 3:30 PM his jaw and tongue felt numb and swollen that is gradually improving almost completely resolved with no associated symptoms including CP or difficulty breathing. Pt states that he feels as if his speech is slightly slurred. Denies new vision changes, gait problem or weakness. Pt states that he has been compliant with taking all his medications. Pt has known coronary blockages and followed by cardiology and known carotid blockages up to 70 %.   Past Medical History  Diagnosis Date  . Diabetes mellitus Dx'd age 64    Novolog via insulin pump; sub-optimal control x years  . High cholesterol     Takes 1/2 of 80mg  atorvastatin daily  . Hypothyroidism     Thyroidectomy 2002; multinodular goiter  . Erectile dysfunction     Se[place]rologist [place]  . Diabetic retinopathy     Proliferative: Hx of retinal detachment on right-light perception only in right eye  . History of tobacco abuse     Quit about 1990 (has 35 pack-yr hx)  . OSA (obstructive sleep apnea)     06/2011 sleep study: moderate OSA, CPAP at 12 cm H2O.  . Impaired vision     right eye light perception only; hx of retinal detachment.  . Recurrent pneumonia   . GERD (gastroesophageal reflux disease)   . Venous insufficiency   .  Diastolic dysfunction 2007    TEE with diastolic dysfunction, EF 74%.  . Diabetic neuropathy     fine touch and position sense affected  . Aortic stenosis, mild Sept/Oct /2013    Mild/mod by echo; mild by cath 10/28/11.  Mild progression by echo 05/2012--f/u echo annually recommended (patient asymptomatic).  . Carotid stenosis, right 09/2011    50-70% stenosis, stable on repeat dopplers 05/2012 but left sided velocities increased 01/2013. Dr. Allyson SabalBerry suggests f/u doppler in 6 mo.  . 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  Med mgmt recommended.  . Abnormal stress test 10/27/2011    lat isch--cardiac cath 10/28/2011  . Venous reflux 10/14/2011    venous doppler-R GSV continuous reflux throughout; too small for VNUS closure  . Decreased pedal pulses     LE doppler 11/08/11- no evidence of arterial insufficiency   Past Surgical History  Procedure Laterality Date  . Thyroid surgery  2002  . Lasix eye    . Eye surgery      Multiple laser surgeries for diabetic retinopathy, also cataract surgery [facility].  [name]. Retinal detachment surgery      Right eye  . Cataract extraction      left  . Transthoracic echocardiogram  10/2011;12/2012  Mod AS, mild LVH, EF 60-65%, no RWMA-being followed by Dr. [name]  . Cardiac catheterization  10/2011    EF normal.  Diffuse 3 vessel CAD, no stents placed.  Medical mgmt per [facility].  [name] Left and right heart catheterization with coronary angiogram N/A 10/28/2011    Procedure: LEFT AND RIGHT HEART CATHETERIZATION WITH CORONARY ANGIOGRAM;  Surgeon: Lennette Bihari, MD;  Location: [facility] CATH LAB;  Service: Cardiovascular;  Laterality: N/A;   Family History  Problem Relation Age of Onset  . Cancer Mother     lung cancer  . Alcohol abuse Father   . Cancer Father     laryngeal cancer  . Cancer Brother     oldest brother had lung cancer and melanoma   History   Substance Use Topics  . Smoking status: Former Games developer  . Smokeless tobacco: Former Neurosurgeon    Quit date: 06/22/1987  . Alcohol Use: Yes    Review of Systems  All other systems reviewed and are negative.     Allergies  Statins and Tape  Home Medications   Prior to Admission medications   Medication Sig Start Date End Date Taking? Authorizing Provider  aspirin EC 81 MG tablet Take 81 mg by mouth daily.    Historical Provider, MD  clotrimazole-betamethasone (LOTRISONE) cream Apply to affected area twice daily as needed for rash 01/06/14   [name], MD  glucagon (GLUCAGON EMERGENCY) 1 MG injection Inject 1 mg into the muscle once as needed. 06/01/12   Carlus Pavlov, MD  Insulin Human (INSULIN PUMP) 100 unit/ml SOLN Inject 1 each into the skin continuous. Novolog - ~50 units a day. 01/15/13   Carlus Pavlov, MD  levothyroxine (SYNTHROID, LEVOTHROID) 137 MCG tablet Take 1 tablet (137 mcg total) by mouth daily. 07/01/13   [name], MD  NOVOLOG 100 UNIT/ML injection PUMP PARAMETERS: BASAL RATE 0.8 U/H, MEAL: 1:11, 1:12, 1:15 BF, L, D, 1 U/75 ABOVE 130 08/09/13   [name], MD  Omega-3 Fatty Acids (FISH OIL) 500 MG CAPS Take 1 capsule by mouth daily.    Historical Provider, MD  pravastatin (PRAVACHOL) 10 MG tablet Take 1 tablet (10 mg total) by mouth at bedtime. 06/11/13   Runell Gess, MD  tadalafil (CIALIS) 20 MG tablet Take 20 mg by mouth daily as needed. For sexual activity    Historical Provider, MD   Triage Vitals: BP 163/68 mmHg  Pulse 77  Temp(Src) 97.8 F (36.6 C) (Oral)  Resp 20  SpO2 96%  Physical Exam  Constitutional: He appears well-developed and well-nourished. No distress.  HENT:  Head: Normocephalic and atraumatic.  Mouth/Throat: Oropharynx is clear and moist. No oropharyngeal exudate.  Eyes: Conjunctivae and EOM are normal. Pupils are equal, round, and reactive to light. Right eye exhibits no discharge. Left eye exhibits no discharge. No scleral  icterus.  Neck: Normal range of motion. Neck supple. No JVD present. No thyromegaly present.  Cardiovascular: Normal rate, regular rhythm and intact distal pulses.  Exam reveals no gallop and no friction rub.   Murmur ( soft murmur) heard. CArotid Bruit L>R  Pulmonary/Chest: Effort normal and breath sounds normal. No respiratory distress. He has no wheezes. He has no rales.  Abdominal: Soft. Bowel sounds are normal. He exhibits no distension and no mass. There is no tenderness.  Musculoskeletal: Normal range of motion. He exhibits no edema or tenderness.  Lymphadenopathy:    He has no cervical adenopathy.  Neurological: He is alert. Coordination normal.  Neurologic exam:  Speech clear, pupils equal round reactive to light, extraocular movements intact  Normal peripheral visual fields Cranial nerves III through XII normal including no facial droop Follows commands, moves all extremities x4, normal strength to bilateral upper and lower extremities at all major muscle groups including grip Sensation normal to light touch and pinprick Coordination intact, no limb ataxia, finger-nose-finger normal Rapid alternating movements normal No pronator drift Gait normal   Skin: Skin is warm and dry. No rash noted. No erythema.  Psychiatric: He has a normal mood and affect. His behavior is normal.  Nursing note and vitals reviewed.   ED Course  Procedures (including critical care time) DIAGNOSTIC STUDIES: Oxygen Saturation is 96% on RA, adequate by my interpretation.    COORDINATION OF CARE: 4:27 PM-Discussed treatment plan with pt at bedside and pt agreed to plan.     Labs Review Labs Reviewed  COMPREHENSIVE METABOLIC PANEL - Abnormal; Notable for the following:    Sodium 133 (*)    Glucose, Bld 286 (*)    GFR calc non Af Amer 76 (*)    GFR calc Af Amer 88 (*)    All other components within normal limits  URINALYSIS, ROUTINE W REFLEX MICROSCOPIC - Abnormal; Notable for the following:     Glucose, UA >1000 (*)    All other components within normal limits  CBG MONITORING, ED - Abnormal; Notable for the following:    Glucose-Capillary 259 (*)    All other components within normal limits  ETHANOL  PROTIME-INR  APTT  CBC  DIFFERENTIAL  URINE RAPID DRUG SCREEN (HOSP PERFORMED)  TROPONIN I  URINE MICROSCOPIC-ADD ON  I-STAT TROPOININ, ED    Imaging Review Ct Head Wo Contrast  01/12/2014   CLINICAL DATA:  Numbness, slurred speech  EXAM: CT HEAD WITHOUT CONTRAST  TECHNIQUE: Contiguous axial images were obtained from the base of the skull through the vertex without intravenous contrast.  COMPARISON:  MRI brain 06/02/2012  FINDINGS: There is no evidence of mass effect, midline shift or extra-axial fluid collections. There is no evidence of a space-occupying lesion or intracranial hemorrhage. There is no evidence of a cortical-based area of acute infarction.  The ventricles and sulci are appropriate for the patient's age. The basal cisterns are patent.  Visualized portions of the orbits are unremarkable. The visualized portions of the paranasal sinuses and mastoid air cells are unremarkable.  The osseous structures are unremarkable.  IMPRESSION: Normal CT of the brain without intravenous contrast.   Electronically Signed   By: Elige Ko   On: 01/12/2014 16:53     EKG Interpretation   Date/Time:  Sunday January 12 2014 16:27:45 EST Ventricular Rate:  79 PR Interval:  160 QRS Duration: 88 QT Interval:  362 QTC Calculation: 415 R Axis:   30 Text Interpretation:  Normal sinus rhythm Nonspecific ST and T wave  abnormality Abnormal ECG No old tracing to compare Confirmed by Anysha Frappier   MD, Karrie Fluellen ([id number]) on 01/12/2014 4:35:18 PM      MDM   Final diagnoses:  Stroke  Hyperglycemia   Pt still has some ongoing facial numbness - feels like it is intraoral at this point - Stroke scale of 0 otherwise.  Will contact neurology.   Labs show hyperglycemia, CT scan without acute  findings, vital signs reassuring and only mild hypertension is present, discussed with the neurologist who recommends inpatient medical treatment and evaluation, the patient has not had a decompensation and appears stable, discussed with hospitalist at  [facility] who will admit.   I personally performed the services described in this documentation, which was scribed in my presence. The recorded information has been reviewed and is accurate.      Nathaniel Roller, MD 01/12/14 [phone]

## 2014-01-12 NOTE — ED Notes (Addendum)
At church his left arm became numb for about 20 min. Then they were driving and the left side of his face became numb, his tongue became numb. Symptoms in face continue, pt states he feels like his speech is slurred.

## 2014-01-12 NOTE — ED Notes (Signed)
Out with [facility], no changes, alert, NAD, calm, interactive. To 4N 08

## 2014-01-12 NOTE — Progress Notes (Signed)
Patient arrived to 4N09 from [facility]. Patient in a pleasant mood. Wife at bedside. Admissions paged about patients arrival. Orienting patient to room and unit. Waiting on admissions orders. Will continue to monitor patient closely. Monia PouchShakenna Siddalee Vanderheiden, RN

## 2014-01-12 NOTE — ED Notes (Signed)
[facility]. No changes. Pt alert, NAD, calm, interactive. VSS.

## 2014-01-13 ENCOUNTER — Observation Stay (HOSPITAL_COMMUNITY): Payer: PRIVATE HEALTH INSURANCE

## 2014-01-13 ENCOUNTER — Telehealth: Payer: Self-pay | Admitting: *Deleted

## 2014-01-13 ENCOUNTER — Encounter (HOSPITAL_COMMUNITY): Payer: Self-pay | Admitting: Radiology

## 2014-01-13 DIAGNOSIS — G459 Transient cerebral ischemic attack, unspecified: Secondary | ICD-10-CM

## 2014-01-13 DIAGNOSIS — G990 Autonomic neuropathy in diseases classified elsewhere: Secondary | ICD-10-CM

## 2014-01-13 DIAGNOSIS — I6521 Occlusion and stenosis of right carotid artery: Secondary | ICD-10-CM

## 2014-01-13 DIAGNOSIS — I350 Nonrheumatic aortic (valve) stenosis: Secondary | ICD-10-CM

## 2014-01-13 DIAGNOSIS — G451 Carotid artery syndrome (hemispheric): Secondary | ICD-10-CM

## 2014-01-13 DIAGNOSIS — I10 Essential (primary) hypertension: Secondary | ICD-10-CM

## 2014-01-13 DIAGNOSIS — I639 Cerebral infarction, unspecified: Secondary | ICD-10-CM

## 2014-01-13 DIAGNOSIS — E1042 Type 1 diabetes mellitus with diabetic polyneuropathy: Secondary | ICD-10-CM

## 2014-01-13 LAB — GLUCOSE, CAPILLARY
GLUCOSE-CAPILLARY: 200 mg/dL — AB (ref 70–99)
GLUCOSE-CAPILLARY: 335 mg/dL — AB (ref 70–99)
GLUCOSE-CAPILLARY: 231 mg/dL — AB (ref 70–99)
GLUCOSE-CAPILLARY: 141 mg/dL — AB (ref 70–99)

## 2014-01-13 LAB — HEMOGLOBIN A1C
Hgb A1c MFr Bld: 9.5 % — ABNORMAL HIGH (ref <5.7)
MEAN PLASMA GLUCOSE: 226 mg/dL — AB (ref <117)

## 2014-01-13 LAB — LIPID PANEL
CHOL/HDL RATIO: 4.5 ratio
Cholesterol: 298 mg/dL — ABNORMAL HIGH (ref 0–200)
HDL: 66 mg/dL (ref >39)
LDL Cholesterol: 211 mg/dL — ABNORMAL HIGH (ref 0–99)
Triglycerides: 105 mg/dL (ref <150)
VLDL: 21 mg/dL (ref 0–40)

## 2014-01-13 MED ORDER — IOHEXOL 350 MG/ML SOLN
50.0000 mL | Freq: Once | INTRAVENOUS | Status: AC | PRN
  Administered 2014-01-13: 50 mL via INTRAVENOUS

## 2014-01-13 MED ORDER — ASPIRIN 300 MG RE SUPP: 300.0000 mg | Freq: Every day | RECTAL | Status: DC

## 2014-01-13 MED ORDER — INSULIN PUMP
Freq: Three times a day (TID) | SUBCUTANEOUS | Status: DC
  Administered 2014-01-13 – 2014-01-14 (×6): via SUBCUTANEOUS
  Administered 2014-01-14: 11.75 via SUBCUTANEOUS
  Administered 2014-01-15 (×4): via SUBCUTANEOUS
  Administered 2014-01-16: 4 via SUBCUTANEOUS
  Administered 2014-01-17 (×2): via SUBCUTANEOUS
  Filled 2014-01-13: qty 1

## 2014-01-13 MED ORDER — HEPARIN SODIUM (PORCINE) 5000 UNIT/ML IJ SOLN
5000.0000 [IU] | Freq: Three times a day (TID) | INTRAMUSCULAR | Status: DC
  Administered 2014-01-13 – 2014-01-15 (×7): 5000 [IU] via SUBCUTANEOUS
  Filled 2014-01-13 (×7): qty 1

## 2014-01-13 MED ORDER — LEVOTHYROXINE SODIUM 137 MCG PO TABS
137.0000 ug | ORAL_TABLET | Freq: Every day | ORAL | Status: DC
  Administered 2014-01-13 – 2014-01-17 (×4): 137 ug via ORAL
  Filled 2014-01-13 (×8): qty 1

## 2014-01-13 MED ORDER — PRAVASTATIN SODIUM 10 MG PO TABS
10.0000 mg | ORAL_TABLET | Freq: Every day | ORAL | Status: DC
  Filled 2014-01-13 (×2): qty 1

## 2014-01-13 MED ORDER — STROKE: EARLY STAGES OF RECOVERY BOOK
Freq: Once | Status: AC
  Administered 2014-01-13: 1

## 2014-01-13 MED ORDER — ASPIRIN 325 MG PO TABS
325.0000 mg | ORAL_TABLET | Freq: Every day | ORAL | Status: DC
  Administered 2014-01-13 – 2014-01-14 (×2): 325 mg via ORAL
  Filled 2014-01-13 (×2): qty 1

## 2014-01-13 NOTE — Telephone Encounter (Signed)
Left detailed message for patient. Will leave encounter open for 24 hours in the even patient calls back.

## 2014-01-13 NOTE — H&P (Addendum)
[facility] History and Physical  [name] DOB: 10/01/1950 DOA: 01/12/2014  Referring physician: EDP PCP: Nathaniel MassedMCGOWEN,PHILIP H, MD   Chief Complaint: Right hand and face numbness   HPI: [name] is a 64 y.o. male with atherosclerotic vascular disease including a known 50-69% stenosis of his RIGHT carotid artery at the bulb and first part of the ICA.  [facility] after 2 episodes of LEFT sided numbness.  He states the first episode onset at 7611 AM, was located in his left arm, was of quality "numbness and tingling", this resolved.  Then later at 3:30 PM today he developed LEFT facial numbness of jaw and tongue which prompted him to go to the ED.  Symptoms gradually improved to resolved.  Felt like his speech was slightly slurred.  No vision changes, no gait abnormalities or weakness.  Review of Systems: Systems reviewed.  As above, otherwise negative  Past Medical History  Diagnosis Date  . Diabetes mellitus Dx'd age 64    Novolog via insulin pump; sub-optimal control x years  . High cholesterol     Takes 1/2 of 80mg  atorvastatin daily  . Hypothyroidism     Thyroidectomy 2002; multinodular goiter  . Erectile dysfunction     Se[place]rologist in [place]  . Diabetic retinopathy     Proliferative: Hx of retinal detachment on right-light perception only in right eye  . History of tobacco abuse     Quit about 1990 (has 35 pack-yr hx)  . OSA (obstructive sleep apnea)     06/2011 sleep study: moderate OSA, CPAP at 12 cm H2O.  . Impaired vision     right eye light perception only; hx of retinal detachment.  . Recurrent pneumonia   . GERD (gastroesophageal reflux disease)   . Venous insufficiency   . Diastolic dysfunction 2007    TEE with diastolic dysfunction, EF 74%.  . Diabetic neuropathy     fine touch and position sense affected  . Aortic stenosis, mild Sept/Oct /2013    Mild/mod by echo; mild by cath 10/28/11.  Mild progression by echo  05/2012--f/u echo annually recommended (patient asymptomatic).  . Carotid stenosis, right 09/2011    50-70% stenosis, stable on repeat dopplers 05/2012 but left sided velocities increased 01/2013. Dr. Allyson SabalBerry suggests f/u doppler in 6 mo.  . 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  Med mgmt recommended.  . Abnormal stress test 10/27/2011    lat isch--cardiac cath 10/28/2011  . Venous reflux 10/14/2011    venous doppler-R GSV continuous reflux throughout; too small for VNUS closure  . Decreased pedal pulses     LE doppler 11/08/11- no evidence of arterial insufficiency   Past Surgical History  Procedure Laterality Date  . Thyroid surgery  2002  . Lasix eye    . Eye surgery      Multiple laser surgeries for diabetic retinopathy, also cataract surgery [facility].  [name]. Retinal detachment surgery      Right eye  . Cataract extraction      left  . Transthoracic echocardiogram  10/2011;12/2012    Mod AS, mild LVH, EF 60-65%, no RWMA-being followed by Dr. Allyson SabalBerry  . Cardiac catheterization  10/2011    EF normal.  Diffuse 3 vessel CAD, no stents placed.  Medical mgmt per [facility].  [name]. Left and right heart catheterization with coronary angiogram N/A 10/28/2011  Procedure: LEFT AND RIGHT HEART CATHETERIZATION WITH CORONARY ANGIOGRAM;  Surgeon: Lennette Bihari, MD;  Location: [facility] CATH LAB;  Service: Cardiovascular;  Laterality: N/A;   Social History:  reports that he has quit smoking. He quit smokeless tobacco use about 26 years ago. He reports that he drinks alcohol. He reports that he does not use illicit drugs.  Allergies  Allergen Reactions  . Statins     Problem with higher dosages (Lipitor caused memory issues)  . Tape Rash    Adhesive tape.  (Paper tape OK)    Family History  Problem Relation Age of Onset  . Cancer Mother     lung cancer  . Alcohol abuse Father   . Cancer Father     laryngeal  cancer  . Cancer Brother     oldest brother had lung cancer and melanoma     Prior to Admission medications   Medication Sig Start Date End Date Taking? Authorizing Provider  aspirin EC 81 MG tablet Take 81 mg by mouth daily.    Historical Provider, MD  clotrimazole-betamethasone (LOTRISONE) cream Apply to affected area twice daily as needed for rash 01/06/14   Nathaniel Massed, MD  glucagon (GLUCAGON EMERGENCY) 1 MG injection Inject 1 mg into the muscle once as needed. 06/01/12   Carlus Pavlov, MD  Insulin Human (INSULIN PUMP) 100 unit/ml SOLN Inject 1 each into the skin continuous. Novolog - ~50 units a day. 01/15/13   Carlus Pavlov, MD  levothyroxine (SYNTHROID, LEVOTHROID) 137 MCG tablet Take 1 tablet (137 mcg total) by mouth daily. 07/01/13   Nathaniel Massed, MD  NOVOLOG 100 UNIT/ML injection PUMP PARAMETERS: BASAL RATE 0.8 U/Carter, MEAL: 1:11, 1:12, 1:15 BF, L, D, 1 U/75 ABOVE 130 08/09/13   Nathaniel Massed, MD  Omega-3 Fatty Acids (FISH OIL) 500 MG CAPS Take 1 capsule by mouth daily.    Historical Provider, MD  pravastatin (PRAVACHOL) 10 MG tablet Take 1 tablet (10 mg total) by mouth at bedtime. 06/11/13   Runell Gess, MD  tadalafil (CIALIS) 20 MG tablet Take 20 mg by mouth daily as needed. For sexual activity    Historical Provider, MD   Physical Exam: Filed Vitals:   01/12/14 2300  BP: 153/66  Pulse: 75  Temp: 98.2 F (36.8 C)  Resp: 16    BP 153/66 mmHg  Pulse 75  Temp(Src) 98.2 F (36.8 C) (Oral)  Resp 16  SpO2 94%  General Appearance:    Alert, oriented, no distress, appears stated age  Head:    Normocephalic, atraumatic  Eyes:    PERRL, EOMI, sclera non-icteric        Nose:   Nares without drainage or epistaxis. Mucosa, turbinates normal  Throat:   Moist mucous membranes. Oropharynx without erythema or exudate.  Neck:   Supple. No carotid bruits.  No thyromegaly.  No lymphadenopathy.   Back:     No CVA tenderness, no spinal tenderness  Lungs:     Clear to  auscultation bilaterally, without wheezes, rhonchi or rales  Chest wall:    No tenderness to palpitation  Heart:    Regular rate and rhythm without murmurs, gallops, rubs  Abdomen:     Soft, non-tender, nondistended, normal bowel sounds, no organomegaly  Genitalia:    deferred  Rectal:    deferred  Extremities:   No clubbing, cyanosis or edema.  Pulses:   2+ and symmetric all extremities  Skin:   Skin color, texture, turgor normal, no rashes  or lesions  Lymph nodes:   Cervical, supraclavicular, and axillary nodes normal  Neurologic:   CNII-XII intact. Normal strength, sensation and reflexes      throughout    Labs on Admission:  Basic Metabolic Panel:  Recent Labs Lab 01/12/14 1625  NA 133*  K 4.1  CL 99  CO2 24  GLUCOSE 286*  BUN 21  CREATININE 1.01  CALCIUM 8.9   Liver Function Tests:  Recent Labs Lab 01/12/14 1625  AST 23  ALT 18  ALKPHOS 81  BILITOT 0.5  PROT 7.0  ALBUMIN 3.8   No results for input(s): LIPASE, AMYLASE in the last 168 hours. No results for input(s): AMMONIA in the last 168 hours. CBC:  Recent Labs Lab 01/12/14 1625  WBC 6.9  NEUTROABS 4.1  HGB 14.3  HCT 40.1  MCV 86.6  PLT 213   Cardiac Enzymes:  Recent Labs Lab 01/12/14 1625  TROPONINI <0.03    BNP (last 3 results) No results for input(s): PROBNP in the last 8760 hours. CBG:  Recent Labs Lab 01/12/14 1624 01/12/14 2119  GLUCAP 259* 351*    Radiological Exams on Admission: Ct Head Wo Contrast  01/12/2014   CLINICAL DATA:  Numbness, slurred speech  EXAM: CT HEAD WITHOUT CONTRAST  TECHNIQUE: Contiguous axial images were obtained from the base of the skull through the vertex without intravenous contrast.  COMPARISON:  MRI brain 06/02/2012  FINDINGS: There is no evidence of mass effect, midline shift or extra-axial fluid collections. There is no evidence of a space-occupying lesion or intracranial hemorrhage. There is no evidence of a cortical-based area of acute infarction.   The ventricles and sulci are appropriate for the patient's age. The basal cisterns are patent.  Visualized portions of the orbits are unremarkable. The visualized portions of the paranasal sinuses and mastoid air cells are unremarkable.  The osseous structures are unremarkable.  IMPRESSION: Normal CT of the brain without intravenous contrast.   Electronically Signed   By: Elige Ko   On: 01/12/2014 16:53    EKG: Independently reviewed.  Assessment/Plan Principal Problem:   TIA (transient ischemic attack) Active Problems:   Aortic stenosis, moderate   Carotid stenosis, right   DM type 1 with diabetic peripheral neuropathy   Essential hypertension   1. TIA / Stroke - Patients LEFT sided symptoms are highly suspicious for his known RIGHT sided carotid stenosis causing a RIGHT sided stroke or TIA. 1. ASA dose increased to 325 daily 2. MRI/MRA ordered 3. Repeat carotid dopplers ordered, especially to take a better look at the R sided atherosclerotic disease which is already known to be in the 50-69% range as of last year. 4. Have held off on ordering a repeat of his 2d echo, as he just had one done 6 days ago (which demonstrated just grade 1 diastolic dysfunction and didn't even demonstrate aortic stenosis despite a prior history of moderate AS on his chart, does demonstrate moderately calcified aortic valve).  Order this if neurology feels it is needed. 5. Based on results of testing, may warrant Vascular surgery consultation for evaluation regarding R sided CEA. 2. DM1, with poor control - continue insulin pump    Code Status: Full Code  Family Communication: Family at bedside Disposition Plan: Admit to inpatient, converted from obs to inpatient given high suspicion of symptomatic carotid stenosis as discussed above.   Time spent: 70 min  Cristan Hout M. [facility] Pager [phone]  If 7AM-7PM, please contact the day team taking care  of the patient [email or web address] Password  [email or web address] 01/13/2014, 12:11 AM

## 2014-01-13 NOTE — Progress Notes (Signed)
Spoke with patient about his insulin pump. Has One Touch Ping. Sees Dr. Lafe GarinGherge as endocrinologist. Patient signed patient contract for insulin pump therapy in hospital and it was placed on shadow chart. Patient gave me his rates on the pump: Basal rates: 12A 0.45   2 am  0.65 U/hr.   7 am  0.95 U/hr.   11 am 0.95 U/hr.   5 pm  0.75 U/ hr.   11 pm 0.50 U/hr.  Total basal: 18.65 units CHO ratio: 12 am   1U/15 gms   10 am  1U/12 gms   4 pm 1U/13 gms ISF:  5:30 am  1U/30 mg  11 am  1U/25 mg  4 PM  1U/30 mg Target: 12 am: 140 mg/dl  1:615:30 am  096110 mg/dl  10 pm  045140 mg/dl  Patient is having wife get supplies from home and will be changing his site when she returns today.  States that he feels that his site needs changing and why blood sugar is elevated.  Will continue to follow while in hospital. Smith MinceKendra Sheri Gatchel RN BSN CDE

## 2014-01-13 NOTE — Consult Note (Addendum)
New Carotid Patient  Referred by:  [facility]  Reason for referral: TIA and R carotid stenosis  History of Present Illness  [name] is a 64 y.o. (02-10-1950) male who presents with chief complaint: possible mini-stroke.  Pt notes On Sunday developing left hand numbness <1 hour which resolved.  He then developed last night 3 hours of left facial anesthesia.   Previous carotid studies with Dr. Hazle Coca office (5/15) demonstrated: RICA 50-69% stenosis, LICA 0-49% stenosis.  Pt has had prior episode of problems with word finding.  The patient has never had amaurosis fugax or monocular blindness.  The patient has never had facial drooping or hemiplegia.  The patient's previous neurologic deficits have resolved.  The patient's risks factors for carotid disease include: IDDM, Hyperlipidemia and prior smoker.  Past Medical History  Diagnosis Date  . Diabetes mellitus Dx'd age 74    Novolog via insulin pump; sub-optimal control x years  . High cholesterol     Takes 1/2 of 80mg  atorvastatin daily  . Hypothyroidism     Thyroidectomy 2002; multinodular goiter  . Erectile dysfunction     [place]  . Diabetic retinopathy     Proliferative: Hx of retinal detachment on right-light perception only in right eye  . History of tobacco abuse     Quit about 1990 (has 35 pack-yr hx)  . OSA (obstructive sleep apnea)     06/2011 sleep study: moderate OSA, CPAP at 12 cm H2O.  . Impaired vision     right eye light perception only; hx of retinal detachment.  . Recurrent pneumonia   . GERD (gastroesophageal reflux disease)   . Venous insufficiency   . Diastolic dysfunction 2007    TEE with diastolic dysfunction, EF 74%.  . Diabetic neuropathy     fine touch and position sense affected  . Aortic stenosis, mild Sept/Oct /2013    Mild/mod by echo; mild by cath 10/28/11.  Mild progression by echo 05/2012--f/u echo annually recommended (patient asymptomatic).  . Carotid stenosis,  right 09/2011    50-70% stenosis, stable on repeat dopplers 05/2012 but left sided velocities increased 01/2013. Dr. [name] suggests f/u doppler in 6 mo.  . 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  Med mgmt recommended.  . Abnormal stress test 10/27/2011    lat isch--cardiac cath 10/28/2011  . Venous reflux 10/14/2011    venous doppler-R GSV continuous reflux throughout; too small for VNUS closure  . Decreased pedal pulses     LE doppler 11/08/11- no evidence of arterial insufficiency    Past Surgical History  Procedure Laterality Date  . Thyroid surgery  2002  . Lasix eye    . Eye surgery      Multiple laser surgeries for diabetic retinopathy, also cataract surgery [facility].  [name] Retinal detachment surgery      Right eye  . Cataract extraction      left  . Transthoracic echocardiogram  10/2011;12/2012    Mod AS, mild LVH, EF 60-65%, no RWMA-being followed by Dr. [name]  . Cardiac catheterization  10/2011    EF normal.  Diffuse 3 vessel CAD, no stents placed.  Medical mgmt per [facility].  [name] Left and right heart catheterization with coronary angiogram N/A 10/28/2011    Procedure: LEFT AND R0IHT HEART CATHETERIZATION WITH CORONARY ANGIOGRAM;  Surgeon: Lennette Bihari, MD;  Location: [facility] CATH  LAB;  Service: Cardiovascular;  Laterality: N/A;    History   Social History  . Marital Status: Married    Spouse Name: N/A    Number of Children: N/A  . Years of Education: N/A   Occupational History  . Not on file.   Social History Main Topics  . Smoking status: Former Games developermoker  . Smokeless tobacco: Former NeurosurgeonUser    Quit date: 06/22/1987  . Alcohol Use: Yes  . Drug Use: No  . Sexual Activity: Not on file   Other Topics Concern  . Not on file   Social History Narrative   Divorced x1, remarried.  Has 3 daughters from first marriage.   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   Distant tobacco abuse (35 pack-yr hx), quit in the late 1990s, no ETOH or drugs.   No exercise.    Family History  Problem Relation Age of Onset  . Cancer Mother     lung cancer  . Alcohol abuse Father   . Cancer Father     laryngeal cancer  . Cancer Brother     oldest brother had lung cancer and melanoma    No current facility-administered medications on file prior to encounter.   Current Outpatient Prescriptions on File Prior to Encounter  Medication Sig Dispense Refill  . aspirin EC 81 MG tablet Take 81 mg by mouth daily.    . clotrimazole-betamethasone (LOTRISONE) cream Apply to affected area twice daily as needed for rash (Patient taking differently: Apply 1 application topically 2 (two) times daily as needed (rash). ) 15 g 2  . levothyroxine (SYNTHROID, LEVOTHROID) 137 MCG tablet Take 1 tablet (137 mcg total) by mouth daily. 90 tablet 0  . tadalafil (CIALIS) 20 MG tablet Take 20 mg by mouth daily as needed for erectile dysfunction.     [name]. glucagon (GLUCAGON EMERGENCY) 1 MG injection Inject 1 mg into the muscle once as needed. (Patient not taking: Reported on 01/13/2014) 2 each prn  . Insulin Human (INSULIN PUMP) 100 unit/ml SOLN Inject 1 each into the skin continuous. Novolog - ~50 units a day. (Patient not taking: Reported on 01/13/2014) 60 each 3  . NOVOLOG 100 UNIT/ML injection PUMP PARAMETERS: BASAL RATE 0.8 U/H, MEAL: 1:11, 1:12, 1:15 BF, L, D, 1 U/75 ABOVE 130 (Patient not taking: Reported on 01/13/2014) 60 mL 1    Allergies  Allergen Reactions  . Statins Other (See Comments)    Problem with higher dosages (Lipitor caused memory issues), also caused leg pains and lethargy  . Tape Rash    Adhesive tape.  (Paper tape OK)    REVIEW OF SYSTEMS:  (Positives checked otherwise negative)  CARDIOVASCULAR:  []  chest pain, []  chest pressure, []  palpitations, []  shortness of breath when  laying flat, []  shortness of breath with exertion,  []  pain in feet when walking, []  pain in feet when laying flat, []  history of blood clot in veins (DVT), []  history of phlebitis, []  swelling in legs, []  varicose veins  PULMONARY:  []  productive cough, []  asthma, []  wheezing  NEUROLOGIC:  []  weakness in arms or legs, [x]  numbness in arms or legs, [x]  difficulty speaking or slurred speech, []  temporary loss of vision in one eye, []  dizziness  HEMATOLOGIC:  []  bleeding problems, []  problems with blood clotting too easily  MUSCULOSKEL:  []  joint pain, []  joint swelling  GASTROINTEST:  []   vomiting blood, []  blood in stool     GENITOURINARY:  []  burning with urination, []  blood in urine  PSYCHIATRIC:  []  history of major depression  INTEGUMENTARY:  []  rashes, []  ulcers  CONSTITUTIONAL:  []  fever, []  chills   For VQI Use Only  PRE-ADM LIVING: Home  AMB STATUS: Ambulatory  CAD Sx: History of MI, but no symptoms No MI within 6 months  PRIOR CHF: None  STRESS TEST: [x]  No, [ ]  Normal, [ ]  + ischemia, [ ]  + MI, [ ]  Both   Physical Examination  Filed Vitals:   01/13/14 0055 01/13/14 0328 01/13/14 0547 01/13/14 0952  BP: 141/70 122/58 117/55 134/62  Pulse: 71 64 65 71  Temp: 98.1 F (36.7 C) 97.9 F (36.6 C) 97.7 F (36.5 C) 98.2 F (36.8 C)  TempSrc: Oral Oral Oral Oral  Resp: 16 16 16 20   SpO2: 94% 97% 95% 94%   There is no weight on file to calculate BMI.  General: A&O x 3, WDWN  Head: [place]/AT  Ear/Nose/Throat: Hearing grossly intact, nares w/o erythema or drainage, oropharynx w/o Erythema/Exudate, Mallampati score: 3  Eyes: PERRLA, EOMI  Neck: Supple, no nuchal rigidity, no palpable LAD  Pulmonary: Sym exp, good air movt, CTAB, no rales, rhonchi, & wheezing  Cardiac: RRR, Nl S1, S2, no Murmurs, rubs or gallops  Vascular: Vessel Right Left  Radial Palpable Palpable  Brachial Palpable Palpable  Carotid Palpable, without bruit Palpable, without bruit  Aorta   Not palpable N/A  Femoral Palpable Palpable  Popliteal Not palpable Not palpable  PT Not Palpable Not Palpable  DP Not Palpable Not Palpable   Gastrointestinal: soft, NTND, -G/R, - HSM, - masses, - CVAT B  Musculoskeletal: M/S 5/5 throughout , Extremities without ischemic changes   Neurologic: CN 2-12 intact , Pain and light touch intact in extremities , Motor exam as listed above  Psychiatric: Judgment intact, Mood & affect appropriate for pt's clinical situation  Dermatologic: See M/S exam for extremity exam, no rashes otherwise noted  Lymph : No Cervical, Axillary, or Inguinal lymphadenopathy   Non-Invasive Vascular Imaging  Preliminary CAROTID DUPLEX (Date: 01/13/2014):   R ICA stenosis: 60-79%  L ICA stenosis: 1-39%  "No change from previous study done 05/21/13."  Radiology: Ct Head Wo Contrast  01/12/2014   CLINICAL DATA:  Numbness, slurred speech  EXAM: CT HEAD WITHOUT CONTRAST  TECHNIQUE: Contiguous axial images were obtained from the base of the skull through the vertex without intravenous contrast.  COMPARISON:  MRI brain 06/02/2012  FINDINGS: There is no evidence of mass effect, midline shift or extra-axial fluid collections. There is no evidence of a space-occupying lesion or intracranial hemorrhage. There is no evidence of a cortical-based area of acute infarction.  The ventricles and sulci are appropriate for the patient's age. The basal cisterns are patent.  Visualized portions of the orbits are unremarkable. The visualized portions of the paranasal sinuses and mastoid air cells are unremarkable.  The osseous structures are unremarkable.  IMPRESSION: Normal CT of the brain without intravenous contrast.   Electronically Signed   By: Elige Ko   On: 01/12/2014 16:53   Mr Brain Wo Contrast  01/13/2014   CLINICAL DATA:  Two episodes of LEFT-sided numbness today, gradually resolved. Possible slurred speech. Known RIGHT carotid artery stenosis.  EXAM: MRI HEAD WITHOUT  CONTRAST  MRA HEAD WITHOUT CONTRAST  TECHNIQUE: Multiplanar, multiecho pulse sequences of the brain and surrounding structures were obtained without intravenous contrast. Angiographic images of  the head were obtained using MRA technique without contrast.  COMPARISON:  CT of the head January 12, 2014 at 1641 hr and MRI of the head Jun 02, 2012  FINDINGS: MRI HEAD FINDINGS  The ventricles and sulci are normal for patient's age. No abnormal parenchymal signal, mass lesions, mass effect. No reduced diffusion to suggest acute ischemia. A few scattered subcentimeter foci of T2 hyperintense signal, less than expected for age. No susceptibility artifact to suggest hemorrhage.  No abnormal extra-axial fluid collections. No extra-axial masses though, contrast enhanced sequences would be more sensitive. Normal major intracranial vascular flow voids seen at the skull base.  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 No abnormal sellar expansion. Visualized paranasal sinuses and mastoid air cells are well-aerated. No suspicious calvarial bone marrow signal. No abnormal sellar expansion. Craniocervical junction maintained.  MRA HEAD FINDINGS  Anterior circulation: Normal flow related enhancement of the included cervical, petrous, cavernous and supra clinoid internal carotid arteries. Patent anterior communicating artery. Normal flow related enhancement of the anterior and middle cerebral arteries, including more distal segments. Supernumerary anterior cerebral artery arising from LEFT A1 2 junction. Very mild intracranial mid to distal vessel luminal irregularity.  No large vessel occlusion, hemodynamically significant stenosis, aneurysm.  Posterior circulation: LEFT vertebral artery is dominant. Basilar artery is patent, with normal flow related enhancement of the main branch vessels. Normal flow related enhancement of the posterior cerebral arteries. RIGHT posterior communicating artery  flow void present.  No large vessel occlusion, hemodynamically significant stenosis, aneurysm.  IMPRESSION: MRI HEAD: No acute intracranial process, specifically no acute ischemia. Normal noncontrast MRI of the brain for age.  MRA HEAD: No acute vascular process. Mild luminal irregularity can be seen with intracranial atherosclerosis without stenosis.   Electronically Signed   By: Awilda Metro   On: 01/13/2014 02:57   Mr Maxine Glenn Head/brain Wo Cm  01/13/2014   CLINICAL DATA:  Two episodes of LEFT-sided numbness today, gradually resolved. Possible slurred speech. Known RIGHT carotid artery stenosis.  EXAM: MRI HEAD WITHOUT CONTRAST  MRA HEAD WITHOUT CONTRAST  TECHNIQUE: Multiplanar, multiecho pulse sequences of the brain and surrounding structures were obtained without intravenous contrast. Angiographic images of the head were obtained using MRA technique without contrast.  COMPARISON:  CT of the head January 12, 2014 at 1641 hr and MRI of the head Jun 02, 2012  FINDINGS: MRI HEAD FINDINGS  The ventricles and sulci are normal for patient's age. No abnormal parenchymal signal, mass lesions, mass effect. No reduced diffusion to suggest acute ischemia. A few scattered subcentimeter foci of T2 hyperintense signal, less than expected for age. No susceptibility artifact to suggest hemorrhage.  No abnormal extra-axial fluid collections. No extra-axial masses though, contrast enhanced sequences would be more sensitive. Normal major intracranial vascular flow voids seen at the skull base.  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 No abnormal sellar expansion. Visualized paranasal sinuses and mastoid air cells are well-aerated. No suspicious calvarial bone marrow signal. No abnormal sellar expansion. Craniocervical junction maintained.  MRA HEAD FINDINGS  Anterior circulation: Normal flow related enhancement of the included cervical, petrous, cavernous and supra clinoid internal  carotid arteries. Patent anterior communicating artery. Normal flow related enhancement of the anterior and middle cerebral arteries, including more distal segments. Supernumerary anterior cerebral artery arising from LEFT A1 2 junction. Very mild intracranial mid to distal vessel luminal irregularity.  No large vessel occlusion, hemodynamically significant stenosis, aneurysm.  Posterior circulation:  LEFT vertebral artery is dominant. Basilar artery is patent, with normal flow related enhancement of the main branch vessels. Normal flow related enhancement of the posterior cerebral arteries. RIGHT posterior communicating artery flow void present.  No large vessel occlusion, hemodynamically significant stenosis, aneurysm.  IMPRESSION: MRI HEAD: No acute intracranial process, specifically no acute ischemia. Normal noncontrast MRI of the brain for age.  MRA HEAD: No acute vascular process. Mild luminal irregularity can be seen with intracranial atherosclerosis without stenosis.   Electronically Signed   By: Awilda Metro   On: 01/13/2014 02:57    Laboratory: CBC:    Component Value Date/Time   WBC 6.9 01/12/2014 1625   RBC 4.63 01/12/2014 1625   HGB 14.3 01/12/2014 1625   HCT 40.1 01/12/2014 1625   PLT 213 01/12/2014 1625   MCV 86.6 01/12/2014 1625   MCH 30.9 01/12/2014 1625   MCHC 35.7 01/12/2014 1625   RDW 12.5 01/12/2014 1625   LYMPHSABS 2.0 01/12/2014 1625   MONOABS 0.5 01/12/2014 1625   EOSABS 0.2 01/12/2014 1625   BASOSABS 0.0 01/12/2014 1625    BMP:    Component Value Date/Time   NA 133* 01/12/2014 1625   K 4.1 01/12/2014 1625   CL 99 01/12/2014 1625   CO2 24 01/12/2014 1625   GLUCOSE 286* 01/12/2014 1625   BUN 21 01/12/2014 1625   CREATININE 1.01 01/12/2014 1625   CREATININE 1.04 05/13/2013 0802   CALCIUM 8.9 01/12/2014 1625   GFRNONAA 76* 01/12/2014 1625   GFRAA 88* 01/12/2014 1625    Coagulation: Lab Results  Component Value Date   INR 0.90 01/12/2014   No results  found for: PTT   Medical Decision Making  Quavon [name] is a 64 y.o. male who presents with: possible sx R ICA stenosis >70%, history of possible L TIA, L ICA stenosis <50%, IDDM, CAD, chronic PAD   This patient is being seen long-term wise by Dr. [name].  The patient has a clear cut preference to AVOID SURGERY due to prolonged post-op intubation during his thyroidectomy, so he would preferentially pick carotid stenting (done by Dr. [name] also) over carotid endarterectomy.   At this point, I would confirm the carotid studies with a CTA Neck.  This will also given anatomic information necessary for a carotid stent.  I discussed in depth with the patient the nature of atherosclerosis, and emphasized the importance of maximal medical management including strict control of blood pressure, blood glucose, and lipid levels, obtaining regular exercise, antiplatelet agents, and cessation of smoking.   The patient is currently not on a statin: due to allergy.  The patient is currently on an anti-platelet: ASA,  The patient is aware that without maximal medical management the underlying atherosclerotic disease process will progress, limiting the benefit of any interventions.  The choice of procedure will need to take into account his cardiac status and patient preference, so I would get Dr. Hazle Coca input also.  Thank you for allowing [place] to participate in this patient's care.  Leonides Sake, MD [facility] Office: [phone] Pager: [phone]  01/13/2014, 5:17 PM

## 2014-01-13 NOTE — Telephone Encounter (Signed)
Order placed for repeat 2DEcho in 12 months (01/2015), assoc with [facility]

## 2014-01-13 NOTE — Progress Notes (Signed)
STROKE TEAM PROGRESS NOTE   HISTORY [name] is an 64 y.o. male who awakened normal today 01/12/2014. He went to church and at church had the acute onset of left arm numbness at about 1045. His symptoms resolved after about 30 minutes. Later in the day at about 1500 01/12/2014 (LKW) he had numbness on the left side of his tongue and left side of his lower face. These symptoms lasted for a few hours. There was no associated facial droop or change in speech. Symptoms again spontaneously resolved. Patient presented to [facility] for evaluation. Work up at [facility] was unremarkable but patient was transferred here for further evaluation. Patient followed by cardiology. He has had a recent echocardiogram that showed a normal EF. His last carotid dopplers were in July of 2015 and showed minimal stenosis on the left and moderate stenosis on the right. Patient was not administered TPA secondary to resolution of symptoms. He was admitted for further evaluation and treatment.   SUBJECTIVE (INTERVAL HISTORY) His wife is at the bedside.  Overall he feels his condition is completely resolved. He has known history of right 70% carotid stenosis is being followed by Dr. [name] cardiologist.   OBJECTIVE Temp:  [97.6 F (36.4 C)-98.2 F (36.8 C)] 98.2 F (36.8 C) (01/11 0952) Pulse Rate:  [64-78] 71 (01/11 0952) Cardiac Rhythm:  [-] Normal sinus rhythm (01/10 2259) Resp:  [12-20] 20 (01/11 0952) BP: (117-167)/(55-77) 134/62 mmHg (01/11 0952) SpO2:  [94 %-97 %] 94 % (01/11 0952)   Recent Labs Lab 01/12/14 1624 01/12/14 2119 01/13/14 0633  GLUCAP 259* 351* 200*    Recent Labs Lab 01/12/14 1625  NA 133*  K 4.1  CL 99  CO2 24  GLUCOSE 286*  BUN 21  CREATININE 1.01  CALCIUM 8.9    Recent Labs Lab 01/12/14 1625  AST 23  ALT 18  ALKPHOS 81  BILITOT 0.5  PROT 7.0  ALBUMIN 3.8    Recent Labs Lab 01/12/14 1625  WBC 6.9  NEUTROABS 4.1  HGB 14.3  HCT 40.1  MCV 86.6  PLT 213     Recent Labs Lab 01/12/14 1625  TROPONINI <0.03    Recent Labs  01/12/14 1625  LABPROT 12.2  INR 0.90    Recent Labs  01/12/14 1649  COLORURINE YELLOW  LABSPEC 1.029  PHURINE 5.5  GLUCOSEU >1000*  HGBUR NEGATIVE  BILIRUBINUR NEGATIVE  KETONESUR NEGATIVE  PROTEINUR NEGATIVE  UROBILINOGEN 1.0  NITRITE NEGATIVE  LEUKOCYTESUR NEGATIVE       Component Value Date/Time   CHOL 298* 01/13/2014 0623   TRIG 105 01/13/2014 0623   HDL 66 01/13/2014 0623   CHOLHDL 4.5 01/13/2014 0623   VLDL 21 01/13/2014 0623   LDLCALC 211* 01/13/2014 0623   Lab Results  Component Value Date   HGBA1C 8.9* 11/12/2013      Component Value Date/Time   LABOPIA NONE DETECTED 01/12/2014 1649   COCAINSCRNUR NONE DETECTED 01/12/2014 1649   LABBENZ NONE DETECTED 01/12/2014 1649   AMPHETMU NONE DETECTED 01/12/2014 1649   THCU NONE DETECTED 01/12/2014 1649   LABBARB NONE DETECTED 01/12/2014 1649     Recent Labs Lab 01/12/14 1625  ETH <5    Ct Head Wo Contrast  01/12/2014   CLINICAL DATA:  Numbness, slurred speech  EXAM: CT HEAD WITHOUT CONTRAST  TECHNIQUE: Contiguous axial images were obtained from the base of the skull through the vertex without intravenous contrast.  COMPARISON:  MRI brain 06/02/2012  FINDINGS: There is no evidence of mass  effect, midline shift or extra-axial fluid collections. There is no evidence of a space-occupying lesion or intracranial hemorrhage. There is no evidence of a cortical-based area of acute infarction.  The ventricles and sulci are appropriate for the patient's age. The basal cisterns are patent.  Visualized portions of the orbits are unremarkable. The visualized portions of the paranasal sinuses and mastoid air cells are unremarkable.  The osseous structures are unremarkable.  IMPRESSION: Normal CT of the brain without intravenous contrast.   Electronically Signed   By: Elige Ko   On: 01/12/2014 16:53   Mr Brain Wo Contrast  01/13/2014   CLINICAL DATA:   Two episodes of LEFT-sided numbness today, gradually resolved. Possible slurred speech. Known RIGHT carotid artery stenosis.  EXAM: MRI HEAD WITHOUT CONTRAST  MRA HEAD WITHOUT CONTRAST  TECHNIQUE: Multiplanar, multiecho pulse sequences of the brain and surrounding structures were obtained without intravenous contrast. Angiographic images of the head were obtained using MRA technique without contrast.  COMPARISON:  CT of the head January 12, 2014 at 1641 hr and MRI of the head Jun 02, 2012  FINDINGS: MRI HEAD FINDINGS  The ventricles and sulci are normal for patient's age. No abnormal parenchymal signal, mass lesions, mass effect. No reduced diffusion to suggest acute ischemia. A few scattered subcentimeter foci of T2 hyperintense signal, less than expected for age. No susceptibility artifact to suggest hemorrhage.  No abnormal extra-axial fluid collections. No extra-axial masses though, contrast enhanced sequences would be more sensitive. Normal major intracranial vascular flow voids seen at the skull base.  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 No abnormal sellar expansion. Visualized paranasal sinuses and mastoid air cells are well-aerated. No suspicious calvarial bone marrow signal. No abnormal sellar expansion. Craniocervical junction maintained.  MRA HEAD FINDINGS  Anterior circulation: Normal flow related enhancement of the included cervical, petrous, cavernous and supra clinoid internal carotid arteries. Patent anterior communicating artery. Normal flow related enhancement of the anterior and middle cerebral arteries, including more distal segments. Supernumerary anterior cerebral artery arising from LEFT A1 2 junction. Very mild intracranial mid to distal vessel luminal irregularity.  No large vessel occlusion, hemodynamically significant stenosis, aneurysm.  Posterior circulation: LEFT vertebral artery is dominant. Basilar artery is patent, with normal flow  related enhancement of the main branch vessels. Normal flow related enhancement of the posterior cerebral arteries. RIGHT posterior communicating artery flow void present.  No large vessel occlusion, hemodynamically significant stenosis, aneurysm.  IMPRESSION: MRI HEAD: No acute intracranial process, specifically no acute ischemia. Normal noncontrast MRI of the brain for age.  MRA HEAD: No acute vascular process. Mild luminal irregularity can be seen with intracranial atherosclerosis without stenosis.   Electronically Signed   By: Awilda Metro   On: 01/13/2014 02:57   Mr Maxine Glenn Head/brain Wo Cm  01/13/2014   CLINICAL DATA:  Two episodes of LEFT-sided numbness today, gradually resolved. Possible slurred speech. Known RIGHT carotid artery stenosis.  EXAM: MRI HEAD WITHOUT CONTRAST  MRA HEAD WITHOUT CONTRAST  TECHNIQUE: Multiplanar, multiecho pulse sequences of the brain and surrounding structures were obtained without intravenous contrast. Angiographic images of the head were obtained using MRA technique without contrast.  COMPARISON:  CT of the head January 12, 2014 at 1641 hr and MRI of the head Jun 02, 2012  FINDINGS: MRI HEAD FINDINGS  The ventricles and sulci are normal for patient's age. No abnormal parenchymal signal, mass lesions, mass effect. No reduced diffusion to suggest acute ischemia. A few scattered  subcentimeter foci of T2 hyperintense signal, less than expected for age. No susceptibility artifact to suggest hemorrhage.  No abnormal extra-axial fluid collections. No extra-axial masses though, contrast enhanced sequences would be more sensitive. Normal major intracranial vascular flow voids seen at the skull base.  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 No abnormal sellar expansion. Visualized paranasal sinuses and mastoid air cells are well-aerated. No suspicious calvarial bone marrow signal. No abnormal sellar expansion. Craniocervical junction  maintained.  MRA HEAD FINDINGS  Anterior circulation: Normal flow related enhancement of the included cervical, petrous, cavernous and supra clinoid internal carotid arteries. Patent anterior communicating artery. Normal flow related enhancement of the anterior and middle cerebral arteries, including more distal segments. Supernumerary anterior cerebral artery arising from LEFT A1 2 junction. Very mild intracranial mid to distal vessel luminal irregularity.  No large vessel occlusion, hemodynamically significant stenosis, aneurysm.  Posterior circulation: LEFT vertebral artery is dominant. Basilar artery is patent, with normal flow related enhancement of the main branch vessels. Normal flow related enhancement of the posterior cerebral arteries. RIGHT posterior communicating artery flow void present.  No large vessel occlusion, hemodynamically significant stenosis, aneurysm.  IMPRESSION: MRI HEAD: No acute intracranial process, specifically no acute ischemia. Normal noncontrast MRI of the brain for age.  MRA HEAD: No acute vascular process. Mild luminal irregularity can be seen with intracranial atherosclerosis without stenosis.   Electronically Signed   By: Awilda Metro   On: 01/13/2014 02:57     PHYSICAL EXAM Pleasant  Obese middle aged Caucasian male not in distress.Awake alert. Afebrile. Head is nontraumatic. Neck is supple with soft right carotid bruit. Hearing is normal. Cardiac exam no murmur or gallop. Lungs are clear to auscultation. Distal pulses are well felt. Neurological Exam ;  Awake  Alert oriented x 3. Normal speech and language.eye movements full without nystagmus.fundi were not visualized. Vision acuity and fields appear normal. Hearing is normal. Palatal movements are normal. Face symmetric. Tongue midline. Normal strength, tone, reflexes and coordination. Normal sensation. Gait deferred .ASSESSMENT/PLAN [name] is a 64 y.o. male with history of type I diabetes and R ICA  stenosis presenting with left sided numbness. He did not receive IV t-PA due to resolution of symptoms.   Right brain TIA, concern for R ICA involvement given known R ICA stenosis  Resultant  Left sided numbness resolved  MRI  No acute stroke  MRA  Unremarkable   Carotid Doppler  Pending. known R ICA stenosis per pt.   2D Echo  pending   Heparin 5000 units sq tid for VTE prophylaxis  Diet Carb Modified thin liquids  aspirin 81 mg orally every day prior to admission, now on aspirin 325 mg orally every day. ,ay recommend change to plavix - await carotid doppler prior to making recommendation as pt may need surgery/stent. Pt has been followed by Dr. [name].  Patient counseled to be compliant with his antithrombotic medications  Ongoing aggressive stroke risk factor management  Therapy recommendations:  pending   Disposition:  Anticipate return home  Hypertension  Stable  Hyperlipidemia  Home meds:  Fish oil  LDL 211, goal < 70  Has been intolerant to statins in the past - has tried 3-4 different ones - it makes him less mentally sharpe and he needs to maintain his sharpness for his job.  Recommend statin or new injection that he can get as an OP. Have referred him to his primary MD as documentation of past fails  is required.  At this time will not add statin due to intolerance.  Diabetes, type 1 (since 1963)  HgbA1c 8.9 in Nov 2015,, goal < 7.0  Uncontrolled  On insulin pump  Other Stroke Risk Factors  Former Cigarette smoker, quit in 1990  ETOH use  Hx TIA 1 year ago with expressive aphasia. He did not see a neurologist at that time. Saw his GP.  Obstructive sleep apnea, on CPAP at home  Hospital day # 1  Rhoderick MoodyBIBY,SHARON  [facility] See Amion for Pager information 01/13/2014 10:39 AM  I have personally examined this patient, reviewed notes, independently viewed imaging studies, participated in medical decision making and plan of care. I have  made any additions or clarifications directly to the above note. Agree with note above. Suspect right brain TIA is symptomatic from moderate proximal right ICA stenosis. If stenosis greater than 70% is confirmed on carotid ultrasound May consider revascularization. I have discussed treatment options including carotid endarterectomy and angioplasty stenting with the patient. We will defer to Dr. Allyson SabalBerry and patient to decide on which procedure. Continue risk factor modification and stroke workup.  Delia HeadyPramod Sethi, MD Medical Director [facility] Pager: [phone] 01/13/2014 3:50 PM    To contact Stroke Continuity provider, please refer to [email or web address]. After hours, contact General Neurology

## 2014-01-13 NOTE — Progress Notes (Addendum)
Patient Demographics  [name], is a 64 y.o. male, DOB - May 14, 1950, [id number]  Admit date - 01/12/2014   Admitting Physician Eduard Clos, MD  Outpatient Primary MD for the patient is [name], MD  LOS - 1   Chief Complaint  Patient presents with  . Numbness      Admission history of present illness/brief narrative:  [name] is a 64 y.o. male with atherosclerotic vascular disease including a known 50-69% stenosis of his RIGHT carotid artery at the bulb and first part of the ICA. Patient presents to [facility]  after 2 episodes of LEFT sided numbness. He states the first episode onset at 72 AM, was located in his left arm, was of quality "numbness and tingling", this resolved. Then later at 3:30 PM today he developed LEFT facial numbness of jaw and tongue which prompted him to go to the ED. Symptoms gradually improved to resolved. Felt like his speech was slightly slurred. No vision changes, no gait abnormalities or weakness. MRI brain, MRA head, did not show any acute intracranial process, and no acute vascular process. Repeat carotid Doppler showing worsening of right ICA stenosis at 60-79 percent.  Subjective:   [name] today has, No headache, No chest pain, No abdominal pain - No Nausea, No new weakness tingling or numbness, No Cough - SOB.   Assessment & Plan    Principal Problem:   TIA (transient ischemic attack) Active Problems:   Aortic stenosis, moderate   Carotid stenosis, right   DM type 1 with diabetic peripheral neuropathy   Essential hypertension   Stroke  Left side numbness/TIA - Neurology consult appreciated - No acute finding on MRI brain, MRA head - Carotid Doppler showing worsening stenosis of right internal carotid artery 60-79 %. - 2-D echo done prior to admission showing grade 1 diastolic  dysfunction. - Continue his aspirin 325 mg oral daily - Vascular surgery consulted regarding right ICA stenosis.  Diabetes mellitus - Patient has insulin pump, CBGs are uncontrolled in the 300s, patient reports that site was not working right, he reinserted in a different site. - Continue with CBG monitoring  Hypertension - Blood pressure acceptable, continue to monitor.  Iatrogenic hypothyroidism status post thyroidectomy - Continue with Synthroid  Code Status: Full  Family Communication: None at bedside  Disposition Plan: Remains on telemetry   Procedures  None   Consults   Neurology Vascular surgery   Medications  Scheduled Meds: . aspirin  300 mg Rectal Daily   Or  . aspirin  325 mg Oral Daily  . heparin  5,000 Units Subcutaneous 3 times per day  . insulin pump   Subcutaneous TID AC, HS, 0200  . levothyroxine  137 mcg Oral QAC breakfast   Continuous Infusions:  PRN Meds:.  DVT Prophylaxis   Heparin   Lab Results  Component Value Date   PLT 213 01/12/2014    Antibiotics    Anti-infectives    None          Objective:   Filed Vitals:   01/13/14 0055 01/13/14 0328 01/13/14 0547 01/13/14 0952  BP: 141/70 122/58 117/55 134/62  Pulse: 71 64 65 71  Temp: 98.1 F (36.7 C) 97.9 F (36.6 C) 97.7 F (36.5  C) 98.2 F (36.8 C)  TempSrc: Oral Oral Oral Oral  Resp: 16 16 16 20   SpO2: 94% 97% 95% 94%    Wt Readings from Last 3 Encounters:  11/12/13 99.791 kg (220 lb)  08/08/13 97.523 kg (215 lb)  07/17/13 98.884 kg (218 lb)    No intake or output data in the 24 hours ending 01/13/14 1527   Physical Exam  Awake Alert, Oriented X 3, No new F.N deficits, Normal affect [place].AT,PERRAL Supple Neck,No JVD, No cervical lymphadenopathy appriciated.  Symmetrical Chest wall movement, Good air movement bilaterally, CTAB RRR,No Gallops,Rubs or new Murmurs, No Parasternal Heave +ve B.Sounds, Abd Soft, No tenderness, No organomegaly appriciated, No  rebound - guarding or rigidity. No Cyanosis, Clubbing or edema, No new Rash or bruise     Data Review   Micro Results No results found for this or any previous visit (from the past 240 hour(s)).  Radiology Reports Ct Head Wo Contrast  01/12/2014   CLINICAL DATA:  Numbness, slurred speech  EXAM: CT HEAD WITHOUT CONTRAST  TECHNIQUE: Contiguous axial images were obtained from the base of the skull through the vertex without intravenous contrast.  COMPARISON:  MRI brain 06/02/2012  FINDINGS: There is no evidence of mass effect, midline shift or extra-axial fluid collections. There is no evidence of a space-occupying lesion or intracranial hemorrhage. There is no evidence of a cortical-based area of acute infarction.  The ventricles and sulci are appropriate for the patient's age. The basal cisterns are patent.  Visualized portions of the orbits are unremarkable. The visualized portions of the paranasal sinuses and mastoid air cells are unremarkable.  The osseous structures are unremarkable.  IMPRESSION: Normal CT of the brain without intravenous contrast.   Electronically Signed   By: Elige Ko   On: 01/12/2014 16:53   Mr Brain Wo Contrast  01/13/2014   CLINICAL DATA:  Two episodes of LEFT-sided numbness today, gradually resolved. Possible slurred speech. Known RIGHT carotid artery stenosis.  EXAM: MRI HEAD WITHOUT CONTRAST  MRA HEAD WITHOUT CONTRAST  TECHNIQUE: Multiplanar, multiecho pulse sequences of the brain and surrounding structures were obtained without intravenous contrast. Angiographic images of the head were obtained using MRA technique without contrast.  COMPARISON:  CT of the head January 12, 2014 at 1641 hr and MRI of the head Jun 02, 2012  FINDINGS: MRI HEAD FINDINGS  The ventricles and sulci are normal for patient's age. No abnormal parenchymal signal, mass lesions, mass effect. No reduced diffusion to suggest acute ischemia. A few scattered subcentimeter foci of T2 hyperintense signal,  less than expected for age. No susceptibility artifact to suggest hemorrhage.  No abnormal extra-axial fluid collections. No extra-axial masses though, contrast enhanced sequences would be more sensitive. Normal major intracranial vascular flow voids seen at the skull base.  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 No abnormal sellar expansion. Visualized paranasal sinuses and mastoid air cells are well-aerated. No suspicious calvarial bone marrow signal. No abnormal sellar expansion. Craniocervical junction maintained.  MRA HEAD FINDINGS  Anterior circulation: Normal flow related enhancement of the included cervical, petrous, cavernous and supra clinoid internal carotid arteries. Patent anterior communicating artery. Normal flow related enhancement of the anterior and middle cerebral arteries, including more distal segments. Supernumerary anterior cerebral artery arising from LEFT A1 2 junction. Very mild intracranial mid to distal vessel luminal irregularity.  No large vessel occlusion, hemodynamically significant stenosis, aneurysm.  Posterior circulation: LEFT vertebral artery is dominant. Basilar artery is patent,  with normal flow related enhancement of the main branch vessels. Normal flow related enhancement of the posterior cerebral arteries. RIGHT posterior communicating artery flow void present.  No large vessel occlusion, hemodynamically significant stenosis, aneurysm.  IMPRESSION: MRI HEAD: No acute intracranial process, specifically no acute ischemia. Normal noncontrast MRI of the brain for age.  MRA HEAD: No acute vascular process. Mild luminal irregularity can be seen with intracranial atherosclerosis without stenosis.   Electronically Signed   By: Awilda Metroourtnay  Bloomer   On: 01/13/2014 02:57   Mr Maxine GlennMra Head/brain Wo Cm  01/13/2014   CLINICAL DATA:  Two episodes of LEFT-sided numbness today, gradually resolved. Possible slurred speech. Known RIGHT carotid artery  stenosis.  EXAM: MRI HEAD WITHOUT CONTRAST  MRA HEAD WITHOUT CONTRAST  TECHNIQUE: Multiplanar, multiecho pulse sequences of the brain and surrounding structures were obtained without intravenous contrast. Angiographic images of the head were obtained using MRA technique without contrast.  COMPARISON:  CT of the head January 12, 2014 at 1641 hr and MRI of the head Jun 02, 2012  FINDINGS: MRI HEAD FINDINGS  The ventricles and sulci are normal for patient's age. No abnormal parenchymal signal, mass lesions, mass effect. No reduced diffusion to suggest acute ischemia. A few scattered subcentimeter foci of T2 hyperintense signal, less than expected for age. No susceptibility artifact to suggest hemorrhage.  No abnormal extra-axial fluid collections. No extra-axial masses though, contrast enhanced sequences would be more sensitive. Normal major intracranial vascular flow voids seen at the skull base.  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 No abnormal sellar expansion. Visualized paranasal sinuses and mastoid air cells are well-aerated. No suspicious calvarial bone marrow signal. No abnormal sellar expansion. Craniocervical junction maintained.  MRA HEAD FINDINGS  Anterior circulation: Normal flow related enhancement of the included cervical, petrous, cavernous and supra clinoid internal carotid arteries. Patent anterior communicating artery. Normal flow related enhancement of the anterior and middle cerebral arteries, including more distal segments. Supernumerary anterior cerebral artery arising from LEFT A1 2 junction. Very mild intracranial mid to distal vessel luminal irregularity.  No large vessel occlusion, hemodynamically significant stenosis, aneurysm.  Posterior circulation: LEFT vertebral artery is dominant. Basilar artery is patent, with normal flow related enhancement of the main branch vessels. Normal flow related enhancement of the posterior cerebral arteries. RIGHT  posterior communicating artery flow void present.  No large vessel occlusion, hemodynamically significant stenosis, aneurysm.  IMPRESSION: MRI HEAD: No acute intracranial process, specifically no acute ischemia. Normal noncontrast MRI of the brain for age.  MRA HEAD: No acute vascular process. Mild luminal irregularity can be seen with intracranial atherosclerosis without stenosis.   Electronically Signed   By: Awilda Metroourtnay  Bloomer   On: 01/13/2014 02:57    CBC  Recent Labs Lab 01/12/14 1625  WBC 6.9  HGB 14.3  HCT 40.1  PLT 213  MCV 86.6  MCH 30.9  MCHC 35.7  RDW 12.5  LYMPHSABS 2.0  MONOABS 0.5  EOSABS 0.2  BASOSABS 0.0    Chemistries   Recent Labs Lab 01/12/14 1625  NA 133*  K 4.1  CL 99  CO2 24  GLUCOSE 286*  BUN 21  CREATININE 1.01  CALCIUM 8.9  AST 23  ALT 18  ALKPHOS 81  BILITOT 0.5   ------------------------------------------------------------------------------------------------------------------ CrCl cannot be calculated (Unknown ideal weight.). ------------------------------------------------------------------------------------------------------------------ No results for input(s): HGBA1C in the last 72 hours. ------------------------------------------------------------------------------------------------------------------  Recent Labs  01/13/14 0623  CHOL 298*  HDL 66  LDLCALC 211*  TRIG  105  CHOLHDL 4.5   ------------------------------------------------------------------------------------------------------------------ No results for input(s): TSH, T4TOTAL, T3FREE, THYROIDAB in the last 72 hours.  Invalid input(s): FREET3 ------------------------------------------------------------------------------------------------------------------ No results for input(s): VITAMINB12, FOLATE, FERRITIN, TIBC, IRON, RETICCTPCT in the last 72 hours.  Coagulation profile  Recent Labs Lab 01/12/14 1625  INR 0.90    No results for input(s): DDIMER in the last  72 hours.  Cardiac Enzymes  Recent Labs Lab 01/12/14 1625  TROPONINI <0.03   ------------------------------------------------------------------------------------------------------------------ Invalid input(s): POCBNP     Time Spent in minutes   30 minutes   Antonius Hartlage M.D on 01/13/2014 at 3:27 PM  Between 7am to 7pm - Pager - [phone]  After 7pm go to [email or web address] - password [email or web address]  And look for the night coverage person covering for me after hours  [facility] Office  [phone]   **Disclaimer: This note may have been dictated with voice recognition software. Similar sounding words can inadvertently be transcribed and this note may contain transcription errors which may not have been corrected upon publication of note.**

## 2014-01-13 NOTE — Consult Note (Signed)
Referring Physician: Hyacinth Meeker    Chief Complaint: Left sided numbness  HPI: [name] is an 64 y.o. male who awakened normal today.  He went to church and at church had the acute onset of left arm numbness at about 1045.  His symptoms resolved after about 30 minutes.  Later in the day at about 1500 he had numbness on the left side of his tongue and left side of his lower face.  These symptoms lasted for a few hours.  There was no associated facial droop or change in speech.  Symptoms again spontaneously resolved.  Patient presented to [facility] for evaluation.  Work up at [facility] was unremarkable but patient was transferred here for further evaluation.   Patient followed by cardiology.  He has had a recent echocardiogram that showed a normal EF.  His last carotid dopplers were in July of 2015 and showed minimal stenosis on the left and moderate stenosis on the right.    Date last known well: Date: 01/12/2014 Time last known well: Time: 15:00 tPA Given: No: Resolution of symptoms  Past Medical History  Diagnosis Date  . Diabetes mellitus Dx'd age 87    Novolog via insulin pump; sub-optimal control x years  . High cholesterol     Takes 1/2 of 80mg  atorvastatin daily  . Hypothyroidism     Thyroidectomy 2002; multinodular goiter  . Erectile dysfunction     [place]rolog[place]  . Diabetic retinopathy     Proliferative: Hx of retinal detachment on right-light perception only in right eye  . History of tobacco abuse     Quit about 1990 (has 35 pack-yr hx)  . OSA (obstructive sleep apnea)     06/2011 sleep study: moderate OSA, CPAP at 12 cm H2O.  . Impaired vision     right eye light perception only; hx of retinal detachment.  . Recurrent pneumonia   . GERD (gastroesophageal reflux disease)   . Venous insufficiency   . Diastolic dysfunction 2007    TEE with diastolic dysfunction, EF 74%.  . Diabetic neuropathy     fine touch and position sense affected  . Aortic stenosis, mild Sept/Oct /2013     Mild/mod by echo; mild by cath 10/28/11.  Mild progression by echo 05/2012--f/u echo annually recommended (patient asymptomatic).  . Carotid stenosis, right 09/2011    50-70% stenosis, stable on repeat dopplers 05/2012 but left sided velocities increased 01/2013. Dr. [name] suggests f/u doppler in 6 mo.  . 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  Med mgmt recommended.  . Abnormal stress test 10/27/2011    lat isch--cardiac cath 10/28/2011  . Venous reflux 10/14/2011    venous doppler-R GSV continuous reflux throughout; too small for VNUS closure  . Decreased pedal pulses     LE doppler 11/08/11- no evidence of arterial insufficiency    Past Surgical History  Procedure Laterality Date  . Thyroid surgery  2002  . Lasix eye    . Eye surgery      Multiple laser surgeries for diabetic retinopathy, also cataract surgery [facility].  [name] Retinal detachment surgery      Right eye  . Cataract extraction      left  . Transthoracic echocardiogram  10/2011;12/2012    Mod AS, mild LVH, EF 60-65%, no RWMA-being followed by Dr. [name]  . Cardiac catheterization  10/2011    EF normal.  Diffuse 3 vessel CAD, no stents placed.  Medical mgmt per [facility].  [name]. Left and right heart catheterization with coronary angiogram N/A 10/28/2011    Procedure: LEFT AND R0IHT HEART CATHETERIZATION WITH CORONARY ANGIOGRAM;  Surgeon: Lennette Biharihomas A Kelly, MD;  [facility];  Service: Cardiovascular;  Laterality: N/A;    Family History  Problem Relation Age of Onset  . Cancer Mother     lung cancer  . Alcohol abuse Father   . Cancer Father     laryngeal cancer  . Cancer Brother     oldest brother had lung cancer and melanoma   Social History:  reports that he has quit smoking. He quit smokeless tobacco use about 26 years ago. He reports that he drinks alcohol. He reports that he does not use illicit  drugs.  Allergies:  Allergies  Allergen Reactions  . Statins     Problem with higher dosages (Lipitor caused memory issues)  . Tape Rash    Adhesive tape.  (Paper tape OK)    Medications:  I have reviewed the patient's current medications. Prior to Admission:  Prescriptions prior to admission  Medication Sig Dispense Refill Last Dose  . aspirin EC 81 MG tablet Take 81 mg by mouth daily.   Taking  . clotrimazole-betamethasone (LOTRISONE) cream Apply to affected area twice daily as needed for rash 15 g 2   . glucagon (GLUCAGON EMERGENCY) 1 MG injection Inject 1 mg into the muscle once as needed. 2 each prn Taking  . Insulin Human (INSULIN PUMP) 100 unit/ml SOLN Inject 1 each into the skin continuous. Novolog - ~50 units a day. 60 each 3 Taking  . levothyroxine (SYNTHROID, LEVOTHROID) 137 MCG tablet Take 1 tablet (137 mcg total) by mouth daily. 90 tablet 0 Taking  . NOVOLOG 100 UNIT/ML injection PUMP PARAMETERS: BASAL RATE 0.8 U/H, MEAL: 1:11, 1:12, 1:15 BF, L, D, 1 U/75 ABOVE 130 60 mL 1 Taking  . Omega-3 Fatty Acids (FISH OIL) 500 MG CAPS Take 1 capsule by mouth daily.   Taking  . pravastatin (PRAVACHOL) 10 MG tablet Take 1 tablet (10 mg total) by mouth at bedtime. 30 tablet 5 Taking  . tadalafil (CIALIS) 20 MG tablet Take 20 mg by mouth daily as needed. For sexual activity   Taking   Scheduled: .  stroke: mapping our early stages of recovery book   Does not apply Once  . aspirin  300 mg Rectal Daily   Or  . aspirin  325 mg Oral Daily  . heparin  5,000 Units Subcutaneous 3 times per day  . insulin pump   Subcutaneous TID AC, HS, 0200  . levothyroxine  137 mcg Oral QAC breakfast  . pravastatin  10 mg Oral QHS    ROS: History obtained from the patient  General ROS: negative for - chills, fatigue, fever, night sweats, weight gain or weight loss Psychological ROS: negative for - behavioral disorder, hallucinations, memory difficulties, mood swings or suicidal ideation Ophthalmic  ROS: decreased vision right eye ENT ROS: negative for - epistaxis, nasal discharge, oral lesions, sore throat, tinnitus or vertigo Allergy and Immunology ROS: negative for - hives or itchy/watery eyes Hematological and Lymphatic ROS: negative for - bleeding problems, bruising or swollen lymph nodes Endocrine ROS: negative for - galactorrhea, hair pattern changes, polydipsia/polyuria or temperature intolerance Respiratory ROS: negative for - cough, hemoptysis, shortness of breath or wheezing Cardiovascular ROS: negative for - chest pain, dyspnea on exertion, edema or irregular heartbeat Gastrointestinal ROS:  negative for - abdominal pain, diarrhea, hematemesis, nausea/vomiting or stool incontinence Genito-Urinary ROS: negative for - dysuria, hematuria, incontinence or urinary frequency/urgency Musculoskeletal ROS: right leg pain Neurological ROS: as noted in HPI Dermatological ROS: negative for rash and skin lesion changes  Physical Examination: Blood pressure 153/66, pulse 75, temperature 98.2 F (36.8 C), temperature source Oral, resp. rate 16, SpO2 94 %.  HEENT-  Normocephalic, no lesions, without obvious abnormality.  Normal external eye and conjunctiva.  Normal TM's bilaterally.  Normal auditory canals and external ears. Normal external nose, mucus membranes and septum.  Normal pharynx. Cardiovascular- S1, S2 normal, pulses palpable throughout   Lungs- chest clear, no wheezing, rales, normal symmetric air entry Abdomen- soft, non-tender; bowel sounds normal; no masses,  no organomegaly Extremities- mild pedal edema Lymph-no adenopathy palpable Musculoskeletal-bilateral elbow deformities Skin-warm and dry, no hyperpigmentation, vitiligo, or suspicious lesions  Neurological Examination Mental Status: Alert, oriented, thought content appropriate.  Speech fluent without evidence of aphasia.  Able to follow 3 step commands without difficulty. Cranial Nerves: II: Discs flat bilaterally;  Visual fields grossly normal in the left eye.  Vision decreased in the right eye, left pupil 2mm and briskly reactive, right pupil 3mm and minimally reactive III,IV, VI: ptosis not present, extra-ocular motions intact bilaterally V,VII: smile symmetric, facial light touch sensation normal bilaterally VIII: hearing normal bilaterally IX,X: gag reflex present XI: bilateral shoulder shrug XII: midline tongue extension Motor: Right : Upper extremity   5/5    Left:     Upper extremity   5/5  Lower extremity   5/5     Lower extremity   5/5 Tone and bulk:normal tone throughout; no atrophy noted Sensory: Pinprick and light touch decreased in the lower extremities to mid-calf in a stocking distribution Deep Tendon Reflexes: 2+ in the upper extremities and absent in the lower extremities Plantars: Right: mute   Left: mute Cerebellar: normal finger-to-nose and normal heel-to-shin testing bilaterally   Laboratory Studies:  Basic Metabolic Panel:  Recent Labs Lab 01/12/14 1625  NA 133*  K 4.1  CL 99  CO2 24  GLUCOSE 286*  BUN 21  CREATININE 1.01  CALCIUM 8.9    Liver Function Tests:  Recent Labs Lab 01/12/14 1625  AST 23  ALT 18  ALKPHOS 81  BILITOT 0.5  PROT 7.0  ALBUMIN 3.8   No results for input(s): LIPASE, AMYLASE in the last 168 hours. No results for input(s): AMMONIA in the last 168 hours.  CBC:  Recent Labs Lab 01/12/14 1625  WBC 6.9  NEUTROABS 4.1  HGB 14.3  HCT 40.1  MCV 86.6  PLT 213    Cardiac Enzymes:  Recent Labs Lab 01/12/14 1625  TROPONINI <0.03    BNP: Invalid input(s): POCBNP  CBG:  Recent Labs Lab 01/12/14 1624 01/12/14 2119  GLUCAP 259* 351*    Microbiology: No results found for this or any previous visit.  Coagulation Studies:  Recent Labs  01/12/14 1625  LABPROT 12.2  INR 0.90    Urinalysis:  Recent Labs Lab 01/12/14 1649  COLORURINE YELLOW  LABSPEC 1.029  PHURINE 5.5  GLUCOSEU >1000*  HGBUR NEGATIVE   BILIRUBINUR NEGATIVE  KETONESUR NEGATIVE  PROTEINUR NEGATIVE  UROBILINOGEN 1.0  NITRITE NEGATIVE  LEUKOCYTESUR NEGATIVE    Lipid Panel:    Component Value Date/Time   CHOL 264* 05/14/2013 0803   TRIG 68 05/14/2013 0803   HDL 67 05/14/2013 0803   CHOLHDL 3.9 05/14/2013 0803   VLDL 14 05/14/2013 0803   LDLCALC  183* 05/14/2013 0803    HgbA1C:  Lab Results  Component Value Date   HGBA1C 8.9* 11/12/2013    Urine Drug Screen:     Component Value Date/Time   LABOPIA NONE DETECTED 01/12/2014 1649   COCAINSCRNUR NONE DETECTED 01/12/2014 1649   LABBENZ NONE DETECTED 01/12/2014 1649   AMPHETMU NONE DETECTED 01/12/2014 1649   THCU NONE DETECTED 01/12/2014 1649   LABBARB NONE DETECTED 01/12/2014 1649    Alcohol Level:  Recent Labs Lab 01/12/14 1625  ETH <5    Other results: EKG: normal sinus rhythm at 79 bpm.  Imaging: Ct Head Wo Contrast  01/12/2014   CLINICAL DATA:  Numbness, slurred speech  EXAM: CT HEAD WITHOUT CONTRAST  TECHNIQUE: Contiguous axial images were obtained from the base of the skull through the vertex without intravenous contrast.  COMPARISON:  MRI brain 06/02/2012  FINDINGS: There is no evidence of mass effect, midline shift or extra-axial fluid collections. There is no evidence of a space-occupying lesion or intracranial hemorrhage. There is no evidence of a cortical-based area of acute infarction.  The ventricles and sulci are appropriate for the patient's age. The basal cisterns are patent.  Visualized portions of the orbits are unremarkable. The visualized portions of the paranasal sinuses and mastoid air cells are unremarkable.  The osseous structures are unremarkable.  IMPRESSION: Normal CT of the brain without intravenous contrast.   Electronically Signed   By: Elige Ko   On: 01/12/2014 16:53    Assessment: 64 y.o. male presenting with two episodes of left sided numbness, one in the arm and the other on the face.  Both have now resolved.  Head CT  personally reviewed and shows no acute changes.  Patient on ASA at home.  Has multiple vascular risk factors, including a moderate right ICA stenosis.  TIA likely.  Further work up recommended.   Echocardiogram recently performed on 1/5 and shows no intracardiac masses or thrombi.   .   Stroke Risk Factors - carotid stenosis, diabetes mellitus and hyperlipidemia  Plan: 1. HgbA1c, fasting lipid panel 2. MRI, MRA  of the brain without contrast 3. PT consult, OT consult, Speech consult 4. Carotid dopplers 5. Prophylactic therapy-Continue ASA 6. NPO until RN stroke swallow screen 7. Telemetry monitoring 8. Frequent neuro checks    Thana Farr, MD [facility] [phone] 01/13/2014, 12:36 AM

## 2014-01-13 NOTE — Progress Notes (Signed)
VASCULAR LAB PRELIMINARY  PRELIMINARY  PRELIMINARY  PRELIMINARY  Carotid Dopplers completed.    Preliminary report:  60-79% right ICA stenosis and 1-39% left ICA stenosis.  Vertebral artery flow is antegrade.  No change from previous study done 05/21/13.    Aerika Groll, RVT 01/13/2014, 12:28 PM

## 2014-01-13 NOTE — Progress Notes (Signed)
UR completed 

## 2014-01-13 NOTE — Telephone Encounter (Signed)
-----   Message from Runell GessJonathan J Berry, MD sent at 01/08/2014  5:07 PM EST ----- Mild worsening in aortic stenosis with valve area but decreased from 1.17 cm 2.93 cm squared. There is normal LV function. Repeat in one year

## 2014-01-14 DIAGNOSIS — I6529 Occlusion and stenosis of unspecified carotid artery: Secondary | ICD-10-CM | POA: Insufficient documentation

## 2014-01-14 LAB — GLUCOSE, CAPILLARY
Glucose-Capillary: 119 mg/dL — ABNORMAL HIGH (ref 70–99)
Glucose-Capillary: 250 mg/dL — ABNORMAL HIGH (ref 70–99)
Glucose-Capillary: 79 mg/dL (ref 70–99)
Glucose-Capillary: 188 mg/dL — ABNORMAL HIGH (ref 70–99)

## 2014-01-14 LAB — CBC
HCT: 42.6 % (ref 39.0–52.0)
Hemoglobin: 14.9 g/dL (ref 13.0–17.0)
MCH: 31.2 pg (ref 26.0–34.0)
MCHC: 35 g/dL (ref 30.0–36.0)
MCV: 89.1 fL (ref 78.0–100.0)
PLATELETS: 202 10*3/uL (ref 150–400)
RBC: 4.78 MIL/uL (ref 4.22–5.81)
RDW: 12.7 % (ref 11.5–15.5)
WBC: 6.9 10*3/uL (ref 4.0–10.5)

## 2014-01-14 LAB — BASIC METABOLIC PANEL
Anion gap: 7 (ref 5–15)
BUN: 16 mg/dL (ref 6–23)
CALCIUM: 8.6 mg/dL (ref 8.4–10.5)
CO2: 29 mmol/L (ref 19–32)
Chloride: 99 mEq/L (ref 96–112)
Creatinine, Ser: 1.04 mg/dL (ref 0.50–1.35)
GFR calc Af Amer: 85 mL/min — ABNORMAL LOW (ref >90)
GFR, EST NON AFRICAN AMERICAN: 73 mL/min — AB (ref >90)
GLUCOSE: 136 mg/dL — AB (ref 70–99)
Potassium: 4.3 mmol/L (ref 3.5–5.1)
SODIUM: 135 mmol/L (ref 135–145)

## 2014-01-14 MED ORDER — ASPIRIN 81 MG PO CHEW
81.0000 mg | CHEWABLE_TABLET | ORAL | Status: AC
  Administered 2014-01-16: 81 mg via ORAL
  Filled 2014-01-14: qty 1

## 2014-01-14 MED ORDER — CLOPIDOGREL BISULFATE 75 MG PO TABS: 75.0000 mg | ORAL_TABLET | Freq: Every day | ORAL | Status: DC

## 2014-01-14 MED ORDER — SODIUM CHLORIDE 0.9 % IV SOLN: 250.0000 mL | INTRAVENOUS | Status: DC | PRN

## 2014-01-14 MED ORDER — PANTOPRAZOLE SODIUM 40 MG PO TBEC
40.0000 mg | DELAYED_RELEASE_TABLET | Freq: Every day | ORAL | Status: DC
  Administered 2014-01-14 – 2014-01-17 (×3): 40 mg via ORAL
  Filled 2014-01-14 (×3): qty 1

## 2014-01-14 MED ORDER — SODIUM CHLORIDE 0.9 % IJ SOLN: 3.0000 mL | INTRAMUSCULAR | Status: DC | PRN

## 2014-01-14 MED ORDER — SODIUM CHLORIDE 0.9 % IJ SOLN
3.0000 mL | Freq: Two times a day (BID) | INTRAMUSCULAR | Status: DC
  Administered 2014-01-14 – 2014-01-15 (×2): 3 mL via INTRAVENOUS

## 2014-01-14 MED ORDER — ASPIRIN EC 81 MG PO TBEC
81.0000 mg | DELAYED_RELEASE_TABLET | Freq: Every day | ORAL | Status: DC
  Administered 2014-01-15: 81 mg via ORAL
  Filled 2014-01-14: qty 1

## 2014-01-14 MED ORDER — CLOPIDOGREL BISULFATE 75 MG PO TABS
600.0000 mg | ORAL_TABLET | Freq: Once | ORAL | Status: AC
  Administered 2014-01-14: 600 mg via ORAL
  Filled 2014-01-14: qty 8

## 2014-01-14 MED ORDER — CLOPIDOGREL BISULFATE 75 MG PO TABS: 600.0000 mg | ORAL_TABLET | Freq: Every day | ORAL | Status: DC

## 2014-01-14 MED ORDER — CLOPIDOGREL BISULFATE 75 MG PO TABS
75.0000 mg | ORAL_TABLET | Freq: Every day | ORAL | Status: DC
  Administered 2014-01-15 – 2014-01-17 (×3): 75 mg via ORAL
  Filled 2014-01-14 (×3): qty 1

## 2014-01-14 NOTE — Progress Notes (Signed)
   Daily Progress Note  Will be by later to see pt.  CTA demonstrates focal very high grade stenosis of R ICA.  Pt will need R CEA vs CAS.  Pt has already expressed preference for CAS.  He will need evaluation with Dr. Allyson SabalBerry (per pt's preference).    - can be discharged and the rest of the work-up completed as an outpatient once follow up with Dr. Allyson SabalBerry arranged - Dr. Darrick PennaFields will also review the case (per institutional requirements, vascular surgeon credentialed to do carotid stents must review case also)   Leonides SakeBrian Tomi Grandpre, MD [facility] Office: [phone] Pager: [phone]  01/14/2014, 10:14 AM

## 2014-01-14 NOTE — Progress Notes (Addendum)
Patient Demographics  [name], is a 64 y.o. male, DOB - 04/21/1950, HYQ:[id number]  Admit date - 01/12/2014   Admitting Physician Eduard Clos, MD  Outpatient Primary MD for the patient is [name], MD  LOS - 2   Chief Complaint  Patient presents with  . Numbness      Admission history of present illness/brief narrative:  [name] is a 64 y.o. male with atherosclerotic vascular disease including a known 50-69% stenosis of his RIGHT carotid artery at the bulb and first part of the ICA. Patient presents to [facility]  after 2 episodes of LEFT sided numbness. He states the first episode onset at 59 AM, was located in his left arm, was of quality "numbness and tingling", this resolved. Then later at 3:30 PM today he developed LEFT facial numbness of jaw and tongue which prompted him to go to the ED. Symptoms gradually improved to resolved. Felt like his speech was slightly slurred. No vision changes, no gait abnormalities or weakness. MRI brain, MRA head, did not show any acute intracranial process, and no acute vascular process. Repeat carotid Doppler showing worsening of right ICA stenosis at 60-79 percent. Vascular surgery were consulted for revascularization options, and they recommend carotid stenting, which will be performed by Dr. [name] on Thursday.  Subjective:   [name] today has, No headache, No chest pain, No abdominal pain - No Nausea, No new weakness tingling or numbness, No Cough - SOB.   Assessment & Plan    Principal Problem:   TIA (transient ischemic attack) Active Problems:   Aortic stenosis, moderate   Carotid stenosis, right   DM type 1 with diabetic peripheral neuropathy   Essential hypertension   Stroke  Left side numbness/TIA - Neurology consult appreciated - No acute finding on MRI brain, MRA head -  Carotid Doppler showing worsening stenosis of right internal carotid artery 60-79 %. - 2-D echo done prior to admission showing grade 1 diastolic dysfunction. - Will have right internal carotid artery stent by Dr. [name] this Thursday. - Aspirin was changed to 81 mg oral daily after patient started on Plavix. - LDL is 211, unfortunately has allergy to statin  Right internal carotid artery stenosis - Vascular surgery consult appreciated. - Given its symptomatic causing TIA symptoms X2, plan is for stenting by Dr. [name] Thursday a.m.[name] - Old with Plavix 600 mg 1/12 , continue with aspirin and Plavix.  Diabetes mellitus - Patient has insulin pump, CBGs are uncontrolled in the 300s, patient reports that site was not working right, he reinserted in a different site. - A1c is 9.5, generally uncontrolled, discussed with patient that he needed better control of his glucose, patient has endocrinologist, she will follow with on discharge. - Continue with CBG monitoring  Hypertension - Blood pressure acceptable, continue to monitor.  Hyperlipidemia - Unfortunately has allergy to statin  Iatrogenic hypothyroidism status post thyroidectomy - Continue with Synthroid  Code Status: Full  Family Communication: None at bedside  Disposition Plan: Remains on telemetry   Procedures  None   Consults   Neurology Vascular surgery Cardiology   Medications  Scheduled Meds: . [START ON 01/15/2014] aspirin EC  81 mg Oral Daily  . clopidogrel  600 mg Oral Once  .  clopidogrel  600 mg Oral Daily  . [START ON 01/15/2014] clopidogrel  75 mg Oral Daily  . [START ON 01/15/2014] clopidogrel  75 mg Oral Daily  . heparin  5,000 Units Subcutaneous 3 times per day  . insulin pump   Subcutaneous TID AC, HS, 0200  . levothyroxine  137 mcg Oral QAC breakfast  . pantoprazole  40 mg Oral Daily   Continuous Infusions:  PRN Meds:.  DVT Prophylaxis   Heparin   Lab Results  Component Value Date   PLT 202  01/14/2014    Antibiotics    Anti-infectives    None          Objective:   Filed Vitals:   01/13/14 1813 01/14/14 0500 01/14/14 0525 01/14/14 0932  BP: 113/57  138/65 107/49  Pulse: 72  67 80  Temp: 98.4 F (36.9 C)  97.8 F (36.6 C) 97.8 F (36.6 C)  TempSrc: Oral  Oral Oral  Resp: Height:   (1.88 m)    Weight:  96.616 kg (213 lb)    SpO2: 96%  96% 95%    Wt Readings from Last 3 Encounters:  01/14/14 96.616 kg (213 lb)  11/12/13 99.791 kg (220 lb)  08/08/13 97.523 kg (215 lb)    No intake or output data in the 24 hours ending 01/14/14 1202   Physical Exam  Awake Alert, Oriented X 3, No new F.N deficits, Normal affect [place].AT,PERRAL Supple Neck,No JVD, No cervical lymphadenopathy appriciated.  Symmetrical Chest wall movement, Good air movement bilaterally, CTAB RRR,No Gallops,Rubs or new Murmurs, No Parasternal Heave +ve B.Sounds, Abd Soft, No tenderness, No organomegaly appriciated, No rebound - guarding or rigidity. No Cyanosis, Clubbing or edema, No new Rash or bruise     Data Review   Micro Results No results found for this or any previous visit (from the past 240 hour(s)).  Radiology Reports Ct Head Wo Contrast  01/12/2014   CLINICAL DATA:  Numbness, slurred speech  EXAM: CT HEAD WITHOUT CONTRAST  TECHNIQUE: Contiguous axial images were obtained from the base of the skull through the vertex without intravenous contrast.  COMPARISON:  MRI brain 06/02/2012  FINDINGS: There is no evidence of mass effect, midline shift or extra-axial fluid collections. There is no evidence of a space-occupying lesion or intracranial hemorrhage. There is no evidence of a cortical-based area of acute infarction.  The ventricles and sulci are appropriate for the patient's age. The basal cisterns are patent.  Visualized portions of the orbits are unremarkable. The visualized portions of the paranasal sinuses and mastoid air cells are unremarkable.  The osseous  structures are unremarkable.  IMPRESSION: Normal CT of the brain without intravenous contrast.   Electronically Signed   By: Elige Ko   On: 01/12/2014 16:53   Ct Angio Neck W/cm &/or Wo/cm  01/13/2014   CLINICAL DATA:  64 year old male with episodes of left side numbness and slurred speech. Right carotid stenosis. Initial encounter.  EXAM: CT ANGIOGRAPHY NECK  TECHNIQUE: Multidetector CT imaging of the neck was performed using the standard protocol during bolus administration of intravenous contrast. Multiplanar CT image reconstructions and MIPs were obtained to evaluate the vascular anatomy. Carotid stenosis measurements (when applicable) are obtained utilizing NASCET criteria, using the distal internal carotid diameter as the denominator.  CONTRAST:  50mL OMNIPAQUE IOHEXOL 350 MG/ML SOLN  COMPARISON:  Brain MRI and intracranial MRA from 0145 hr today.  FINDINGS: Negative lung apices. No superior mediastinal lymphadenopathy. Small surgical  clips at the thyroid bed, minimal residual thyroid parenchyma. Larynx, pharynx, parapharyngeal spaces, retropharyngeal space, sublingual space, submandibular glands, and parotid glands are within normal limits. Stable orbits. Visualized paranasal sinuses and mastoids are clear. No acute osseous abnormality identified. No cervical lymphadenopathy.  VASCULAR FINDINGS:  Aortic arch: 3 vessel configuration. Minimal arch atherosclerosis and no great vessel origin stenosis.  Right carotid system: Negative right CCA proximal to the right carotid bifurcation. At the carotid bifurcation there is bulky mostly soft but partially calcified plaque posteriorly tracking into the right ICA origin and bulb. Subsequent high-grade stenosis (radiographic string sign series 404, image 90). However, the right ICA remains patent and is otherwise negative to the skullbase. There is extensive calcified right ICA siphon plaque.  Left carotid system: Negative left CCA proximal to the left carotid  bifurcation. Mostly calcified plaque at the posterior left carotid bifurcation, left ICA origin and bulb. Subsequent left ICA stenosis of less than 50 % with respect to the distal vessel. beyond the bulb, the cervical left ICA is negative. Extensive calcified plaque in the visible left ICA siphon.  Vertebral arteries:Calcified right and soft left subclavian artery atherosclerosis but no hemodynamically significant proximal subclavian stenosis. Normal right vertebral artery origin. Negative right vertebral artery to the basilar. Normal right PICA origin.  Minimal calcified plaque at the left vertebral artery origin, no stenosis. Negative left vertebral artery otherwise to the basilar. Dominant appearing left AICA. Negative visible basilar artery.  IMPRESSION: 1. Right ICA origin high-grade stenosis (radiographic string sign) related tube bulky mostly soft plaque in the posterior bifurcation and continuing to the right ICA bulb. 2. No left cervical carotid hemodynamically significant stenosis. Negative vertebral arteries. 3. Heavily calcified ICA siphons.   Electronically Signed   By: Augusto Gamble M.D.   On: 01/13/2014 19:44   Mr Brain Wo Contrast  01/13/2014   CLINICAL DATA:  Two episodes of LEFT-sided numbness today, gradually resolved. Possible slurred speech. Known RIGHT carotid artery stenosis.  EXAM: MRI HEAD WITHOUT CONTRAST  MRA HEAD WITHOUT CONTRAST  TECHNIQUE: Multiplanar, multiecho pulse sequences of the brain and surrounding structures were obtained without intravenous contrast. Angiographic images of the head were obtained using MRA technique without contrast.  COMPARISON:  CT of the head January 12, 2014 at 1641 hr and MRI of the head Jun 02, 2012  FINDINGS: MRI HEAD FINDINGS  The ventricles and sulci are normal for patient's age. No abnormal parenchymal signal, mass lesions, mass effect. No reduced diffusion to suggest acute ischemia. A few scattered subcentimeter foci of T2 hyperintense signal, less  than expected for age. No susceptibility artifact to suggest hemorrhage.  No abnormal extra-axial fluid collections. No extra-axial masses though, contrast enhanced sequences would be more sensitive. Normal major intracranial vascular flow voids seen at the skull base.  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 No abnormal sellar expansion. Visualized paranasal sinuses and mastoid air cells are well-aerated. No suspicious calvarial bone marrow signal. No abnormal sellar expansion. Craniocervical junction maintained.  MRA HEAD FINDINGS  Anterior circulation: Normal flow related enhancement of the included cervical, petrous, cavernous and supra clinoid internal carotid arteries. Patent anterior communicating artery. Normal flow related enhancement of the anterior and middle cerebral arteries, including more distal segments. Supernumerary anterior cerebral artery arising from LEFT A1 2 junction. Very mild intracranial mid to distal vessel luminal irregularity.  No large vessel occlusion, hemodynamically significant stenosis, aneurysm.  Posterior circulation: LEFT vertebral artery is dominant. Basilar artery is patent, with  normal flow related enhancement of the main branch vessels. Normal flow related enhancement of the posterior cerebral arteries. RIGHT posterior communicating artery flow void present.  No large vessel occlusion, hemodynamically significant stenosis, aneurysm.  IMPRESSION: MRI HEAD: No acute intracranial process, specifically no acute ischemia. Normal noncontrast MRI of the brain for age.  MRA HEAD: No acute vascular process. Mild luminal irregularity can be seen with intracranial atherosclerosis without stenosis.   Electronically Signed   By: Awilda Metroourtnay  Bloomer   On: 01/13/2014 02:57   Mr Maxine GlennMra Head/brain Wo Cm  01/13/2014   CLINICAL DATA:  Two episodes of LEFT-sided numbness today, gradually resolved. Possible slurred speech. Known RIGHT carotid artery  stenosis.  EXAM: MRI HEAD WITHOUT CONTRAST  MRA HEAD WITHOUT CONTRAST  TECHNIQUE: Multiplanar, multiecho pulse sequences of the brain and surrounding structures were obtained without intravenous contrast. Angiographic images of the head were obtained using MRA technique without contrast.  COMPARISON:  CT of the head January 12, 2014 at 1641 hr and MRI of the head Jun 02, 2012  FINDINGS: MRI HEAD FINDINGS  The ventricles and sulci are normal for patient's age. No abnormal parenchymal signal, mass lesions, mass effect. No reduced diffusion to suggest acute ischemia. A few scattered subcentimeter foci of T2 hyperintense signal, less than expected for age. No susceptibility artifact to suggest hemorrhage.  No abnormal extra-axial fluid collections. No extra-axial masses though, contrast enhanced sequences would be more sensitive. Normal major intracranial vascular flow voids seen at the skull base.  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 No abnormal sellar expansion. Visualized paranasal sinuses and mastoid air cells are well-aerated. No suspicious calvarial bone marrow signal. No abnormal sellar expansion. Craniocervical junction maintained.  MRA HEAD FINDINGS  Anterior circulation: Normal flow related enhancement of the included cervical, petrous, cavernous and supra clinoid internal carotid arteries. Patent anterior communicating artery. Normal flow related enhancement of the anterior and middle cerebral arteries, including more distal segments. Supernumerary anterior cerebral artery arising from LEFT A1 2 junction. Very mild intracranial mid to distal vessel luminal irregularity.  No large vessel occlusion, hemodynamically significant stenosis, aneurysm.  Posterior circulation: LEFT vertebral artery is dominant. Basilar artery is patent, with normal flow related enhancement of the main branch vessels. Normal flow related enhancement of the posterior cerebral arteries. RIGHT  posterior communicating artery flow void present.  No large vessel occlusion, hemodynamically significant stenosis, aneurysm.  IMPRESSION: MRI HEAD: No acute intracranial process, specifically no acute ischemia. Normal noncontrast MRI of the brain for age.  MRA HEAD: No acute vascular process. Mild luminal irregularity can be seen with intracranial atherosclerosis without stenosis.   Electronically Signed   By: Awilda Metroourtnay  Bloomer   On: 01/13/2014 02:57    CBC  Recent Labs Lab 01/12/14 1625 01/14/14 0528  WBC 6.9 6.9  HGB 14.3 14.9  HCT 40.1 42.6  PLT 213 202  MCV 86.6 89.1  MCH 30.9 31.2  MCHC 35.7 35.0  RDW 12.5 12.7  LYMPHSABS 2.0  --   MONOABS 0.5  --   EOSABS 0.2  --   BASOSABS 0.0  --     Chemistries   Recent Labs Lab 01/12/14 1625 01/14/14 0528  NA 133* 135  K 4.1 4.3  CL 99 99  CO2 24 29  GLUCOSE 286* 136*  BUN 21 16  CREATININE 1.01 1.04  CALCIUM 8.9 8.6  AST 23  --   ALT 18  --   ALKPHOS 81  --   BILITOT  0.5  --    ------------------------------------------------------------------------------------------------------------------ estimated creatinine clearance is 82.3 mL/min (by C-G formula based on Cr of 1.04). ------------------------------------------------------------------------------------------------------------------  Recent Labs  01/13/14 0623  HGBA1C 9.5*   ------------------------------------------------------------------------------------------------------------------  Recent Labs  01/13/14 0623  CHOL 298*  HDL 66  LDLCALC 211*  TRIG 105  CHOLHDL 4.5   ------------------------------------------------------------------------------------------------------------------ No results for input(s): TSH, T4TOTAL, T3FREE, THYROIDAB in the last 72 hours.  Invalid input(s): FREET3 ------------------------------------------------------------------------------------------------------------------ No results for input(s): VITAMINB12, FOLATE,  FERRITIN, TIBC, IRON, RETICCTPCT in the last 72 hours.  Coagulation profile  Recent Labs Lab 01/12/14 1625  INR 0.90    No results for input(s): DDIMER in the last 72 hours.  Cardiac Enzymes  Recent Labs Lab 01/12/14 1625  TROPONINI <0.03   ------------------------------------------------------------------------------------------------------------------ Invalid input(s): POCBNP     Time Spent in minutes   30 minutes   Corrinne Benegas M.D on 01/14/2014 at 12:02 PM  Between 7am to 7pm - Pager - [phone]  After 7pm go to [email or web address] - password [email or web address]  And look for the night coverage person covering for me after hours  [facility] Office  [phone]   **Disclaimer: This note may have been dictated with voice recognition software. Similar sounding words can inadvertently be transcribed and this note may contain transcription errors which may not have been corrected upon publication of note.**

## 2014-01-14 NOTE — Consult Note (Signed)
Reason for Consult: Symptomatic RICA stenosis for CAS  Requesting Physician: Pearlean Brownie  HPI:  Mr Robison is a 64 -year-old mildly overweight married Caucasian male, father of 3 and grandfather to 3 grandchildren, whom I initially saw October 12, 2011.I last saw him in the office 6 months ago He has a history of insulin-dependent diabetes on insulin pump, mild sleep apnea, and hyperlipidemia as well as mild aortic stenosis. He had a Myoview stress test that was abnormal which led to a heart catheterization performed by Dr. Tresa Endo in my absence revealing 70% disease in all 3 vessels with preserved LV function and mild AS. He also has moderate right ICA stenosis. He is neurologically asymptomatic. He was placed on beta-blocker and Ranexa. Apparently he is having mood swings, which may be a side effect of one of his medications.since I saw him 6 months ago he denies chest pain or shortness of breath. He has experienced some long-term memory loss and has had this worked up with an MRI of his brain. A concern this may be related to his statin therapy. We did recheck a 2-D echo which showed preserved LV function with mild progression of his aortic stenosis. Carotid Dopplers showed stable moderate right ICA stenosis. He was admitted to [facility] with 2 sequential TIAs. Carotid Dopplers did show moderate right ICA stenosis. The patient was seen by Dr. Pearlean Brownie, stroke neurologist, asked Dr. Imogene Burn, vascular surgeon for consultation. Dr. Imogene Burn ordered a CTA which showed high-grade ostial right internal carotid artery stenosis. I am asked to see the patient for consideration of carotid artery stenting. The patient was offered carotid endarterectomy. [name] apparently had a bad experience remotely being intubated at the time of the parathyroid operation and does not want to be reintubated again if at all possible making carotid stenting the best option for revascularization.   Problem List: Patient Active  Problem List   Diagnosis Date Noted  . Stroke   . TIA (transient ischemic attack) 01/12/2014  . Pre-syncope 05/13/2013  . Essential hypertension 03/18/2013  . Trochanteric bursitis of left hip 01/18/2013  . Preventative health care 01/18/2013  . OSA (obstructive sleep apnea)   . Mild cognitive impairment 05/09/2012  . DM type 1 with diabetic peripheral neuropathy 05/09/2012  . CAD (coronary artery disease) 01/11/2012  . Abnormal nuclear cardiac imaging test, 10/2011 10/28/2011  . Chronic venous insufficiency 09/28/2011  . 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 Aortic stenosis, moderate 09/28/2011  . Carotid stenosis, right 09/28/2011  . Colon cancer screening 04/21/2011  . Fatigue 04/21/2011  . Hyperlipidemia 03/21/2011  . Diabetic peripheral neuropathy 01/19/2011  . Restless legs syndrome 01/19/2011  . Hypothyroidism 01/13/2011    PMHx:  Past Medical History  Diagnosis Date  . Diabetes mellitus Dx'd age 57    Novolog via insulin pump; sub-optimal control x years  . High cholesterol     Takes 1/2 of 80mg  atorvastatin daily  . Hypothyroidism     Thyroidectomy 2002; multinodular goiter  . Erectile dysfunction     Se[place] urologist in [place]  . Diabetic retinopathy     Proliferative: Hx of retinal detachment on right-light perception only in right eye  . History of tobacco abuse     Quit about 1990 (has 35 pack-yr hx)  . OSA (obstructive sleep apnea)     06/2011 sleep study: moderate OSA, CPAP at 12 cm H2O.  . Impaired vision     right eye light perception only; hx of retinal detachment.  [name]  Recurrent pneumonia   . GERD (gastroesophageal reflux disease)   . Venous insufficiency   . Diastolic dysfunction 2007    TEE with diastolic dysfunction, EF 74%.  . Diabetic neuropathy     fine touch and position sense affected  . Aortic stenosis, mild Sept/Oct /2013    Mild/mod by echo; mild by cath 10/28/11.  Mild progression by echo 05/2012--f/u echo annually recommended (patient  asymptomatic).  . Carotid stenosis, right 09/2011    50-70% stenosis, stable on repeat dopplers 05/2012 but left sided velocities increased 01/2013. Dr. [name] suggests f/u doppler in 6 mo.  . 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  Med mgmt recommended.  . Abnormal stress test 10/27/2011    lat isch--cardiac cath 10/28/2011  . Venous reflux 10/14/2011    venous doppler-R GSV continuous reflux throughout; too small for VNUS closure  . Decreased pedal pulses     LE doppler 11/08/11- no evidence of arterial insufficiency   Past Surgical History  Procedure Laterality Date  . Thyroid surgery  2002  . Lasix eye    . Eye surgery      Multiple laser surgeries for diabetic retinopathy, also cataract surgery [facility].  [name] Retinal detachment surgery      Right eye  . Cataract extraction      left  . Transthoracic echocardiogram  10/2011;12/2012    Mod AS, mild LVH, EF 60-65%, no RWMA-being followed by Dr. [name]  . Cardiac catheterization  10/2011    EF normal.  Diffuse 3 vessel CAD, no stents placed.  Medical mgmt per [facility].  [name] Left and right heart catheterization with coronary angiogram N/A 10/28/2011    Procedure: LEFT AND R0IHT HEART CATHETERIZATION WITH CORONARY ANGIOGRAM;  Surgeon: Lennette Bihari, MD;  Location: [facility] CATH LAB;  Service: Cardiovascular;  Laterality: N/A;    FAMHx: Family History  Problem Relation Age of Onset  . Cancer Mother     lung cancer  . Alcohol abuse Father   . Cancer Father     laryngeal cancer  . Cancer Brother     oldest brother had lung cancer and melanoma    SOCHx:  reports that he has quit smoking. He quit smokeless tobacco use about 26 years ago. He reports that he drinks alcohol. He reports that he does not use illicit drugs.  ALLERGIES: Allergies  Allergen Reactions  . Statins Other (See Comments)    Problem with higher dosages (Lipitor caused  memory issues), also caused leg pains and lethargy  . Tape Rash    Adhesive tape.  (Paper tape OK)    ROS: Pertinent items are noted in HPI.  HOME MEDICATIONS: Prescriptions prior to admission  Medication Sig Dispense Refill Last Dose  . aspirin EC 81 MG tablet Take 81 mg by mouth daily.   01/12/2014 at Unknown time  . clotrimazole-betamethasone (LOTRISONE) cream Apply to affected area twice daily as needed for rash (Patient taking differently: Apply 1 application topically 2 (two) times daily as needed (rash). ) 15 g 2 01/09/2014  . Feverfew 380 MG CAPS Take 380 mg by mouth daily. For migraine headaches   01/12/2014 at Unknown time  . Insulin Human (INSULIN PUMP) SOLN Inject into the skin continuous. Basal rate .95/hr, current pump settings: 12am .45, 2am .65, 7am .95, 11am .95, 5pm .75, 11pm .5   01/13/2014 at Unknown time  . levothyroxine (SYNTHROID,  LEVOTHROID) 137 MCG tablet Take 1 tablet (137 mcg total) by mouth daily. 90 tablet 0 01/12/2014 at Unknown time  . Omega-3 Fatty Acids (FISH OIL) 1000 MG CAPS Take 1,000 mg by mouth daily.   01/12/2014 at Unknown time  . ranitidine (ZANTAC) 150 MG tablet Take 150 mg by mouth daily as needed for heartburn.   01/11/2014  . tadalafil (CIALIS) 20 MG tablet Take 20 mg by mouth daily as needed for erectile dysfunction.    2 months ago  . glucagon (GLUCAGON EMERGENCY) 1 MG injection Inject 1 mg into the muscle once as needed. (Patient not taking: Reported on 01/13/2014) 2 each prn Not Taking at Unknown time  . Insulin Human (INSULIN PUMP) 100 unit/ml SOLN Inject 1 each into the skin continuous. Novolog - ~50 units a day. (Patient not taking: Reported on 01/13/2014) 60 each 3 Not Taking at Unknown time  . NOVOLOG 100 UNIT/ML injection PUMP PARAMETERS: BASAL RATE 0.8 U/H, MEAL: 1:11, 1:12, 1:15 BF, L, D, 1 U/75 ABOVE 130 (Patient not taking: Reported on 01/13/2014) 60 mL 1 Not Taking at Unknown time    HOSPITAL MEDICATIONS: I have reviewed the patient's current  medications.  VITALS: Blood pressure 107/49, pulse 80, temperature 97.8 F (36.6 C), temperature source Oral, resp. rate 18, height 6\' 2"  (1.88 m), weight 213 lb (96.616 kg), SpO2 95 %.  INPUT/OUTPUT        PHYSICAL EXAM: General appearance: alert and no distress Neck: no adenopathy, no JVD, supple, symmetrical, trachea midline, thyroid not enlarged, symmetric, no tenderness/mass/nodules and bilateral carotid bruits Lungs: clear to auscultation bilaterally Heart: soft outflow tract murmur Extremities: extremities normal, atraumatic, no cyanosis or edema  LABS:  BMP  Recent Labs  07/17/13 0826 01/12/14 1625 01/14/14 0528  NA 135 133* 135  K 4.6 4.1 4.3  CL 100 99 99  CO2 30 24 29   GLUCOSE 254* 286* 136*  BUN 15 21 16   CREATININE 0.9 1.01 1.04  CALCIUM 8.8 8.9 8.6  GFRNONAA  --  76* 73*  GFRAA  --  88* 85*    CBC  Recent Labs Lab 01/14/14 0528  WBC 6.9  RBC 4.78  HGB 14.9  HCT 42.6  PLT 202  MCV 89.1    HEMOGLOBIN A1C Lab Results  Component Value Date   HGBA1C 9.5* 01/13/2014   MPG 226* 01/13/2014    Cardiac Panel (last 3 results)  Recent Labs  01/12/14 1625  TROPONINI <0.03    BNP (last 3 results) No results for input(s): PROBNP in the last 8760 hours.  TSH  Recent Labs  01/15/13 0839 07/17/13 0826  TSH 3.16 6.16*    CHOLESTEROL  Recent Labs  05/14/13 0803 01/13/14 0623  CHOL 264* 298*    Hepatic Function Panel  Recent Labs  05/14/13 0803 01/12/14 1625  PROT 6.5 7.0  ALBUMIN 3.8 3.8  AST 18 23  ALT 16 18  ALKPHOS 75 81  BILITOT 0.8 0.5  BILIDIR 0.2  --   IBILI 0.6  --     IMAGING: Ct Head Wo Contrast  01/12/2014   CLINICAL DATA:  Numbness, slurred speech  EXAM: CT HEAD WITHOUT CONTRAST  TECHNIQUE: Contiguous axial images were obtained from the base of the skull through the vertex without intravenous contrast.  COMPARISON:  MRI brain 06/02/2012  FINDINGS: There is no evidence of mass effect, midline shift or  extra-axial fluid collections. There is no evidence of a space-occupying lesion or intracranial hemorrhage. There is no evidence of  a cortical-based area of acute infarction.  The ventricles and sulci are appropriate for the patient's age. The basal cisterns are patent.  Visualized portions of the orbits are unremarkable. The visualized portions of the paranasal sinuses and mastoid air cells are unremarkable.  The osseous structures are unremarkable.  IMPRESSION: Normal CT of the brain without intravenous contrast.   Electronically Signed   By: Elige Ko   On: 01/12/2014 16:53   Ct Angio Neck W/cm &/or Wo/cm  01/13/2014   CLINICAL DATA:  64 year old male with episodes of left side numbness and slurred speech. Right carotid stenosis. Initial encounter.  EXAM: CT ANGIOGRAPHY NECK  TECHNIQUE: Multidetector CT imaging of the neck was performed using the standard protocol during bolus administration of intravenous contrast. Multiplanar CT image reconstructions and MIPs were obtained to evaluate the vascular anatomy. Carotid stenosis measurements (when applicable) are obtained utilizing NASCET criteria, using the distal internal carotid diameter as the denominator.  CONTRAST:  50mL OMNIPAQUE IOHEXOL 350 MG/ML SOLN  COMPARISON:  Brain MRI and intracranial MRA from 0145 hr today.  FINDINGS: Negative lung apices. No superior mediastinal lymphadenopathy. Small surgical clips at the thyroid bed, minimal residual thyroid parenchyma. Larynx, pharynx, parapharyngeal spaces, retropharyngeal space, sublingual space, submandibular glands, and parotid glands are within normal limits. Stable orbits. Visualized paranasal sinuses and mastoids are clear. No acute osseous abnormality identified. No cervical lymphadenopathy.  VASCULAR FINDINGS:  Aortic arch: 3 vessel configuration. Minimal arch atherosclerosis and no great vessel origin stenosis.  Right carotid system: Negative right CCA proximal to the right carotid bifurcation. At  the carotid bifurcation there is bulky mostly soft but partially calcified plaque posteriorly tracking into the right ICA origin and bulb. Subsequent high-grade stenosis (radiographic string sign series 404, image 90). However, the right ICA remains patent and is otherwise negative to the skullbase. There is extensive calcified right ICA siphon plaque.  Left carotid system: Negative left CCA proximal to the left carotid bifurcation. Mostly calcified plaque at the posterior left carotid bifurcation, left ICA origin and bulb. Subsequent left ICA stenosis of less than 50 % with respect to the distal vessel. beyond the bulb, the cervical left ICA is negative. Extensive calcified plaque in the visible left ICA siphon.  Vertebral arteries:Calcified right and soft left subclavian artery atherosclerosis but no hemodynamically significant proximal subclavian stenosis. Normal right vertebral artery origin. Negative right vertebral artery to the basilar. Normal right PICA origin.  Minimal calcified plaque at the left vertebral artery origin, no stenosis. Negative left vertebral artery otherwise to the basilar. Dominant appearing left AICA. Negative visible basilar artery.  IMPRESSION: 1. Right ICA origin high-grade stenosis (radiographic string sign) related tube bulky mostly soft plaque in the posterior bifurcation and continuing to the right ICA bulb. 2. No left cervical carotid hemodynamically significant stenosis. Negative vertebral arteries. 3. Heavily calcified ICA siphons.   Electronically Signed   By: Augusto Gamble M.D.   On: 01/13/2014 19:44   Mr Brain Wo Contrast  01/13/2014   CLINICAL DATA:  Two episodes of LEFT-sided numbness today, gradually resolved. Possible slurred speech. Known RIGHT carotid artery stenosis.  EXAM: MRI HEAD WITHOUT CONTRAST  MRA HEAD WITHOUT CONTRAST  TECHNIQUE: Multiplanar, multiecho pulse sequences of the brain and surrounding structures were obtained without intravenous contrast.  Angiographic images of the head were obtained using MRA technique without contrast.  COMPARISON:  CT of the head January 12, 2014 at 1641 hr and MRI of the head Jun 02, 2012  FINDINGS: MRI HEAD FINDINGS  The ventricles and sulci are normal for patient's age. No abnormal parenchymal signal, mass lesions, mass effect. No reduced diffusion to suggest acute ischemia. A few scattered subcentimeter foci of T2 hyperintense signal, less than expected for age. No susceptibility artifact to suggest hemorrhage.  No abnormal extra-axial fluid collections. No extra-axial masses though, contrast enhanced sequences would be more sensitive. Normal major intracranial vascular flow voids seen at the skull base.  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 No abnormal sellar expansion. Visualized paranasal sinuses and mastoid air cells are well-aerated. No suspicious calvarial bone marrow signal. No abnormal sellar expansion. Craniocervical junction maintained.  MRA HEAD FINDINGS  Anterior circulation: Normal flow related enhancement of the included cervical, petrous, cavernous and supra clinoid internal carotid arteries. Patent anterior communicating artery. Normal flow related enhancement of the anterior and middle cerebral arteries, including more distal segments. Supernumerary anterior cerebral artery arising from LEFT A1 2 junction. Very mild intracranial mid to distal vessel luminal irregularity.  No large vessel occlusion, hemodynamically significant stenosis, aneurysm.  Posterior circulation: LEFT vertebral artery is dominant. Basilar artery is patent, with normal flow related enhancement of the main branch vessels. Normal flow related enhancement of the posterior cerebral arteries. RIGHT posterior communicating artery flow void present.  No large vessel occlusion, hemodynamically significant stenosis, aneurysm.  IMPRESSION: MRI HEAD: No acute intracranial process, specifically no acute  ischemia. Normal noncontrast MRI of the brain for age.  MRA HEAD: No acute vascular process. Mild luminal irregularity can be seen with intracranial atherosclerosis without stenosis.   Electronically Signed   By: Awilda Metroourtnay  Bloomer   On: 01/13/2014 02:57   Mr Maxine GlennMra Head/brain Wo Cm  01/13/2014   CLINICAL DATA:  Two episodes of LEFT-sided numbness today, gradually resolved. Possible slurred speech. Known RIGHT carotid artery stenosis.  EXAM: MRI HEAD WITHOUT CONTRAST  MRA HEAD WITHOUT CONTRAST  TECHNIQUE: Multiplanar, multiecho pulse sequences of the brain and surrounding structures were obtained without intravenous contrast. Angiographic images of the head were obtained using MRA technique without contrast.  COMPARISON:  CT of the head January 12, 2014 at 1641 hr and MRI of the head Jun 02, 2012  FINDINGS: MRI HEAD FINDINGS  The ventricles and sulci are normal for patient's age. No abnormal parenchymal signal, mass lesions, mass effect. No reduced diffusion to suggest acute ischemia. A few scattered subcentimeter foci of T2 hyperintense signal, less than expected for age. No susceptibility artifact to suggest hemorrhage.  No abnormal extra-axial fluid collections. No extra-axial masses though, contrast enhanced sequences would be more sensitive. Normal major intracranial vascular flow voids seen at the skull base.  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 No abnormal sellar expansion. Visualized paranasal sinuses and mastoid air cells are well-aerated. No suspicious calvarial bone marrow signal. No abnormal sellar expansion. Craniocervical junction maintained.  MRA HEAD FINDINGS  Anterior circulation: Normal flow related enhancement of the included cervical, petrous, cavernous and supra clinoid internal carotid arteries. Patent anterior communicating artery. Normal flow related enhancement of the anterior and middle cerebral arteries, including more distal segments.  Supernumerary anterior cerebral artery arising from LEFT A1 2 junction. Very mild intracranial mid to distal vessel luminal irregularity.  No large vessel occlusion, hemodynamically significant stenosis, aneurysm.  Posterior circulation: LEFT vertebral artery is dominant. Basilar artery is patent, with normal flow related enhancement of the main branch vessels. Normal flow related enhancement of the posterior cerebral arteries. RIGHT posterior communicating artery flow void present.  No large vessel occlusion, hemodynamically significant stenosis, aneurysm.  IMPRESSION: MRI HEAD: No acute intracranial process, specifically no acute ischemia. Normal noncontrast MRI of the brain for age.  MRA HEAD: No acute vascular process. Mild luminal irregularity can be seen with intracranial atherosclerosis without stenosis.   Electronically Signed   By: Awilda Metro   On: 01/13/2014 02:57    IMPRESSION: 1. Symptomatic right internal carotid artery stenosis-we have been following Mr. Mcgurn carotid duplex in the office which has remained moderate in severity and stable. He presented with TIAs in the right carotid distribution with MR I/MRAs that did not show stroke. He was evaluated by Dr. Pearlean Brownie felt that he needed revascularization and given the patient's preference to not be intubated carotid artery stenting would be the most appropriate option. I have gone over in detail the procedure with the patient and his wife including risk and benefits and agreed to proceed. 2. Moderate aortic stenosis-currently stable and asymptomatic 3. Moderate coronary artery disease-documented by cardiac catheterization and currently asymptomatic 4. Insulin-dependent diabetes 5. Hyperlipidemia   RECOMMENDATION: 1. I'm going to perform right internal carotid artery stenting first case Thursday morning. I have told Dr. Randol Kern to load Mr. Bailon was 600 mg of Plavix by mouth today followed by 75 mg daily. He is already on  aspirin.  Time Spent Directly with Patient: 45 minutes  BERRY,JONATHAN J 01/14/2014, 11:39 AM

## 2014-01-14 NOTE — Progress Notes (Signed)
STROKE TEAM PROGRESS NOTE   HISTORY Nathaniel DemarkRichard Carter is an 64 y.o. male who awakened normal today 01/12/2014. He went to church and at church had [name] acute onset of left arm numbness at about 1045. His symptoms resolved after about 30 minutes. Later in [name] day at about 1500 01/12/2014 (LKW) he had numbness on [name] left side of his tongue and left side of his lower face. These symptoms lasted for a few hours. There was no associated facial droop or change in speech. Symptoms again spontaneously resolved. Patient [facility]. [facility] was unremarkable but patient was transferred here for further evaluation. Patient followed by cardiology. He has had a recent echocardiogram that showed a normal EF. His last carotid dopplers were in July of 2015 and showed minimal stenosis on [name] left and moderate stenosis on [name] right. Patient was not administered TPA secondary to resolution of symptoms. He was admitted for further evaluation and treatment.   SUBJECTIVE (INTERVAL HISTORY) His wife is at [name] bedside.  Overall he feels his condition is completely resolved. He has known history of right 70% carotid stenosis is being followed by Dr. Allyson SabalBerry cardiologist. No recurrent neurovascular symptoms. Neurologically stable. Repeat carotid ultrasound shows 6-79% right ICA stenosis but heavily calcified plaque may mask higher velocities. Appreciate Dr. Nicky Pughhen's vascular surgery consult. Patient has indicated he clearly does not want carotid endarterectomy and would prefer carotid stent. CT angiogram of [name] neck was performed last night and films personally reviewed which shows significant plaque at [name] right carotid bifurcation with near string sign indicative of severe stenosis   OBJECTIVE Temp:  [97.8 F (36.6 C)-98.4 F (36.9 C)] 97.8 F (36.6 C) (01/12 0932) Pulse Rate:  [67-80] 80 (01/12 0932) Cardiac Rhythm:  [-] Normal sinus rhythm (01/11 2000) Resp:  [18-20] 18 (01/12 0932) BP:  (107-138)/(49-65) 107/49 mmHg (01/12 0932) SpO2:  [95 %-96 %] 95 % (01/12 0932) Weight:  [213 lb (96.616 kg)] 213 lb (96.616 kg) (01/12 0500)   Recent Labs Lab 01/13/14 1129 01/13/14 1630 01/13/14 2206 01/14/14 0657 01/14/14 1137  GLUCAP 335* 231* 141* 119* 250*    Recent Labs Lab 01/12/14 1625 01/14/14 0528  NA 133* 135  K 4.1 4.3  CL 99 99  CO2 24 29  GLUCOSE 286* 136*  BUN 21 16  CREATININE 1.01 1.04  CALCIUM 8.9 8.6    Recent Labs Lab 01/12/14 1625  AST 23  ALT 18  ALKPHOS 81  BILITOT 0.5  PROT 7.0  ALBUMIN 3.8    Recent Labs Lab 01/12/14 1625 01/14/14 0528  WBC 6.9 6.9  NEUTROABS 4.1  --   HGB 14.3 14.9  HCT 40.1 42.6  MCV 86.6 89.1  PLT 213 202    Recent Labs Lab 01/12/14 1625  TROPONINI <0.03    Recent Labs  01/12/14 1625  LABPROT 12.2  INR 0.90    Recent Labs  01/12/14 1649  COLORURINE YELLOW  LABSPEC 1.029  PHURINE 5.5  GLUCOSEU >1000*  HGBUR NEGATIVE  BILIRUBINUR NEGATIVE  KETONESUR NEGATIVE  PROTEINUR NEGATIVE  UROBILINOGEN 1.0  NITRITE NEGATIVE  LEUKOCYTESUR NEGATIVE       Component Value Date/Time   CHOL 298* 01/13/2014 0623   TRIG 105 01/13/2014 0623   HDL 66 01/13/2014 0623   CHOLHDL 4.5 01/13/2014 0623   VLDL 21 01/13/2014 0623   LDLCALC 211* 01/13/2014 0623   Lab Results  Component Value Date   HGBA1C 9.5* 01/13/2014      Component Value  Date/Time   LABOPIA NONE DETECTED 01/12/2014 1649   COCAINSCRNUR NONE DETECTED 01/12/2014 1649   LABBENZ NONE DETECTED 01/12/2014 1649   AMPHETMU NONE DETECTED 01/12/2014 1649   THCU NONE DETECTED 01/12/2014 1649   LABBARB NONE DETECTED 01/12/2014 1649     Recent Labs Lab 01/12/14 1625  ETH <5    Ct Head Wo Contrast  01/12/2014   CLINICAL DATA:  Numbness, slurred speech  EXAM: CT HEAD WITHOUT CONTRAST  TECHNIQUE: Contiguous axial images were obtained from [name] base of [name] skull through [name] vertex without intravenous contrast.  COMPARISON:  MRI brain  06/02/2012  FINDINGS: There is no evidence of mass effect, midline shift or extra-axial fluid collections. There is no evidence of a space-occupying lesion or intracranial hemorrhage. There is no evidence of a cortical-based area of acute infarction.  [name] ventricles and sulci are appropriate for [name] patient's age. [name] basal cisterns are patent.  Visualized portions of [name] orbits are unremarkable. [name] visualized portions of [name] paranasal sinuses and mastoid air cells are unremarkable.  [name] osseous structures are unremarkable.  IMPRESSION: Normal CT of [name] brain without intravenous contrast.   Electronically Signed   By: Elige Ko   On: 01/12/2014 16:53   Ct Angio Neck W/cm &/or Wo/cm  01/13/2014   CLINICAL DATA:  64 year old male with episodes of left side numbness and slurred speech. Right carotid stenosis. Initial encounter.  EXAM: CT ANGIOGRAPHY NECK  TECHNIQUE: Multidetector CT imaging of [name] neck was performed using [name] standard protocol during bolus administration of intravenous contrast. Multiplanar CT image reconstructions and MIPs were obtained to evaluate [name] vascular anatomy. Carotid stenosis measurements (when applicable) are obtained utilizing NASCET criteria, using [name] distal internal carotid diameter as [name] denominator.  CONTRAST:  50mL OMNIPAQUE IOHEXOL 350 MG/ML SOLN  COMPARISON:  Brain MRI and intracranial MRA from 0145 hr today.  FINDINGS: Negative lung apices. No superior mediastinal lymphadenopathy. Small surgical clips at [name] thyroid bed, minimal residual thyroid parenchyma. Larynx, pharynx, parapharyngeal spaces, retropharyngeal space, sublingual space, submandibular glands, and parotid glands are within normal limits. Stable orbits. Visualized paranasal sinuses and mastoids are clear. No acute osseous abnormality identified. No cervical lymphadenopathy.  VASCULAR FINDINGS:  Aortic arch: 3 vessel configuration. Minimal arch atherosclerosis and no great vessel origin stenosis.  Right  carotid system: Negative right CCA proximal to [name] right carotid bifurcation. At [name] carotid bifurcation there is bulky mostly soft but partially calcified plaque posteriorly tracking into [name] right ICA origin and bulb. Subsequent high-grade stenosis (radiographic string sign series 404, image 90). However, [name] right ICA remains patent and is otherwise negative to [name] skullbase. There is extensive calcified right ICA siphon plaque.  Left carotid system: Negative left CCA proximal to [name] left carotid bifurcation. Mostly calcified plaque at [name] posterior left carotid bifurcation, left ICA origin and bulb. Subsequent left ICA stenosis of less than 50 % with respect to [name] distal vessel. beyond [name] bulb, [name] cervical left ICA is negative. Extensive calcified plaque in [name] visible left ICA siphon.  Vertebral arteries:Calcified right and soft left subclavian artery atherosclerosis but no hemodynamically significant proximal subclavian stenosis. Normal right vertebral artery origin. Negative right vertebral artery to [name] basilar. Normal right PICA origin.  Minimal calcified plaque at [name] left vertebral artery origin, no stenosis. Negative left vertebral artery otherwise to [name] basilar. Dominant appearing left AICA. Negative visible basilar artery.  IMPRESSION: 1. Right ICA origin high-grade stenosis (radiographic string sign) related tube bulky mostly soft plaque in [name] posterior bifurcation and  continuing to [name] right ICA bulb. 2. No left cervical carotid hemodynamically significant stenosis. Negative vertebral arteries. 3. Heavily calcified ICA siphons.   Electronically Signed   By: Augusto Gamble M.D.   On: 01/13/2014 19:44   Mr Brain Wo Contrast  01/13/2014   CLINICAL DATA:  Two episodes of LEFT-sided numbness today, gradually resolved. Possible slurred speech. Known RIGHT carotid artery stenosis.  EXAM: MRI HEAD WITHOUT CONTRAST  MRA HEAD WITHOUT CONTRAST  TECHNIQUE: Multiplanar, multiecho pulse sequences of [name] brain  and surrounding structures were obtained without intravenous contrast. Angiographic images of [name] head were obtained using MRA technique without contrast.  COMPARISON:  CT of [name] head January 12, 2014 at 1641 hr and MRI of [name] head Jun 02, 2012  FINDINGS: MRI HEAD FINDINGS  [name] ventricles and sulci are normal for patient's age. No abnormal parenchymal signal, mass lesions, mass effect. No reduced diffusion to suggest acute ischemia. A few scattered subcentimeter foci of T2 hyperintense signal, less than expected for age. No susceptibility artifact to suggest hemorrhage.  No abnormal extra-axial fluid collections. No extra-axial masses though, contrast enhanced sequences would be more sensitive. Normal major intracranial vascular flow voids seen at [name] skull base.  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 No abnormal sellar expansion. Visualized paranasal sinuses and mastoid air cells are well-aerated. No suspicious calvarial bone marrow signal. No abnormal sellar expansion. Craniocervical junction maintained.  MRA HEAD FINDINGS  Anterior circulation: Normal flow related enhancement of [name] included cervical, petrous, cavernous and supra clinoid internal carotid arteries. Patent anterior communicating artery. Normal flow related enhancement of [name] anterior and middle cerebral arteries, including more distal segments. Supernumerary anterior cerebral artery arising from LEFT A1 2 junction. Very mild intracranial mid to distal vessel luminal irregularity.  No large vessel occlusion, hemodynamically significant stenosis, aneurysm.  Posterior circulation: LEFT vertebral artery is dominant. Basilar artery is patent, with normal flow related enhancement of [name] main branch vessels. Normal flow related enhancement of [name] posterior cerebral arteries. RIGHT posterior communicating artery flow void present.  No large vessel occlusion, hemodynamically significant stenosis, aneurysm.   IMPRESSION: MRI HEAD: No acute intracranial process, specifically no acute ischemia. Normal noncontrast MRI of [name] brain for age.  MRA HEAD: No acute vascular process. Mild luminal irregularity can be seen with intracranial atherosclerosis without stenosis.   Electronically Signed   By: Awilda Metro   On: 01/13/2014 02:57   Mr Maxine Glenn Head/brain Wo Cm  01/13/2014   CLINICAL DATA:  Two episodes of LEFT-sided numbness today, gradually resolved. Possible slurred speech. Known RIGHT carotid artery stenosis.  EXAM: MRI HEAD WITHOUT CONTRAST  MRA HEAD WITHOUT CONTRAST  TECHNIQUE: Multiplanar, multiecho pulse sequences of [name] brain and surrounding structures were obtained without intravenous contrast. Angiographic images of [name] head were obtained using MRA technique without contrast.  COMPARISON:  CT of [name] head January 12, 2014 at 1641 hr and MRI of [name] head Jun 02, 2012  FINDINGS: MRI HEAD FINDINGS  [name] ventricles and sulci are normal for patient's age. No abnormal parenchymal signal, mass lesions, mass effect. No reduced diffusion to suggest acute ischemia. A few scattered subcentimeter foci of T2 hyperintense signal, less than expected for age. No susceptibility artifact to suggest hemorrhage.  No abnormal extra-axial fluid collections. No extra-axial masses though, contrast enhanced sequences would be more sensitive. Normal major intracranial vascular flow voids seen at [name] skull base.  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 No  abnormal sellar expansion. Visualized paranasal sinuses and mastoid air cells are well-aerated. No suspicious calvarial bone marrow signal. No abnormal sellar expansion. Craniocervical junction maintained.  MRA HEAD FINDINGS  Anterior circulation: Normal flow related enhancement of [name] included cervical, petrous, cavernous and supra clinoid internal carotid arteries. Patent anterior communicating artery. Normal flow related enhancement of [name]  anterior and middle cerebral arteries, including more distal segments. Supernumerary anterior cerebral artery arising from LEFT A1 2 junction. Very mild intracranial mid to distal vessel luminal irregularity.  No large vessel occlusion, hemodynamically significant stenosis, aneurysm.  Posterior circulation: LEFT vertebral artery is dominant. Basilar artery is patent, with normal flow related enhancement of [name] main branch vessels. Normal flow related enhancement of [name] posterior cerebral arteries. RIGHT posterior communicating artery flow void present.  No large vessel occlusion, hemodynamically significant stenosis, aneurysm.  IMPRESSION: MRI HEAD: No acute intracranial process, specifically no acute ischemia. Normal noncontrast MRI of [name] brain for age.  MRA HEAD: No acute vascular process. Mild luminal irregularity can be seen with intracranial atherosclerosis without stenosis.   Electronically Signed   By: Awilda Metro   On: 01/13/2014 02:57     PHYSICAL EXAM Pleasant  Obese middle aged Caucasian male not in distress.Awake alert. Afebrile. Head is nontraumatic. Neck is supple with soft right carotid bruit. Hearing is normal. Cardiac exam no murmur or gallop. Lungs are clear to auscultation. Distal pulses are well felt. Neurological Exam ;  Awake  Alert oriented x 3. Normal speech and language.eye movements full without nystagmus.fundi were not visualized. Vision acuity and fields appear normal. Hearing is normal. Palatal movements are normal. Face symmetric. Tongue midline. Normal strength, tone, reflexes and coordination. Normal sensation. Gait deferred .ASSESSMENT/PLAN [name] Carter is a 64 y.o. male with history of type I diabetes and R ICA stenosis presenting with left sided numbness. He did not receive IV t-PA due to resolution of symptoms.   Right brain TIA, concern for R ICA involvement given known R ICA stenosis  Resultant  Left sided numbness resolved  MRI  No acute  stroke  MRA  Unremarkable   Carotid Doppler 60-79& RICA stenosis but heavily calcified plaque may mask higher velocities  2D Echo Left ventricle: [name] cavity size was normal. Wall thickness was increased in a pattern of mild LVH. Doppler parameters are consistent with abnormal left ventricular relaxation (grade 1 diastolic dysfunction).  Heparin 5000 units sq tid for VTE prophylaxis Diet Carb Modified Diet NPO time specified Except for: Sips with Meds  Diet NPO time specified Except for: Sips with Meds thin liquids  aspirin 81 mg orally every day prior to admission, now on aspirin 325 mg orally every day. ,ay recommend change to plavix - await carotid doppler prior to making recommendation as pt may need surgery/stent. Pt has been followed by Dr. [name].  Patient counseled to be compliant with his antithrombotic medications  Ongoing aggressive stroke risk factor management  Therapy recommendations:  Home  Disposition:  Anticipate return home  Hypertension  Stable  Hyperlipidemia  Home meds:  Fish oil  LDL 211, goal < 70  Has been intolerant to statins in [name] past - has tried 3-4 different ones - it makes him less mentally sharpe and he needs to maintain his sharpness for his job.  Recommend statin or new injection that he can get as an OP. Have referred him to his primary MD as documentation of past fails is required.  At this time will not add statin due to intolerance.  Diabetes, type 1 (since 1963)  HgbA1c 8.9 in Nov 2015,, goal < 7.0  Uncontrolled  On insulin pump  Other Stroke Risk Factors  Former Cigarette smoker, quit in 1990  ETOH use  Hx TIA 1 year ago with expressive aphasia. He did not see a neurologist at that time. Saw his GP.  Obstructive sleep apnea, on CPAP at home  Hospital day # 2  Ciria Bernardini  [facility] See Amion for Pager information 01/14/2014 1:24 PM  I have personally examined this patient, reviewed notes,  independently viewed imaging studies, participated in medical decision making and plan of care.  Suspect right brain TIA is symptomatic from sever proximal right ICA stenosis.[name] patient is at significant risk for recurrent strokes over [name] next few weeks and may benefit with emergent revascularization with angioplasty stenting which he prefers. I recommend he stay in [name] hospital. Until this procedure is done over [name] next few days Long d/w patient, Dr [name] and Myra Gianotti and answered questions. Start Plavix and aspirin prior to [name] stent and continue for 3 months postprocedure and then stop aspirin. Continue risk factor modification .[name]  Delia Heady, MD Medical Director [facility] Pager: [phone] 01/14/2014 1:24 PM    To contact Stroke Continuity provider, please refer to [email or web address]. After hours, contact General Neurology

## 2014-01-14 NOTE — Progress Notes (Signed)
UR comopleted.

## 2014-01-15 ENCOUNTER — Ambulatory Visit: Payer: PRIVATE HEALTH INSURANCE | Admitting: Family Medicine

## 2014-01-15 LAB — BASIC METABOLIC PANEL
Anion gap: 5 (ref 5–15)
BUN: 15 mg/dL (ref 6–23)
CHLORIDE: 101 meq/L (ref 96–112)
CO2: 30 mmol/L (ref 19–32)
Calcium: 8.7 mg/dL (ref 8.4–10.5)
Creatinine, Ser: 1.16 mg/dL (ref 0.50–1.35)
GFR calc Af Amer: 75 mL/min — ABNORMAL LOW (ref >90)
GFR, EST NON AFRICAN AMERICAN: 64 mL/min — AB (ref >90)
GLUCOSE: 120 mg/dL — AB (ref 70–99)
Potassium: 4.7 mmol/L (ref 3.5–5.1)
SODIUM: 136 mmol/L (ref 135–145)

## 2014-01-15 LAB — CBC
HCT: 39.6 % (ref 39.0–52.0)
Hemoglobin: 14.1 g/dL (ref 13.0–17.0)
MCH: 30.9 pg (ref 26.0–34.0)
MCHC: 35.6 g/dL (ref 30.0–36.0)
MCV: 86.7 fL (ref 78.0–100.0)
PLATELETS: 180 10*3/uL (ref 150–400)
RBC: 4.57 MIL/uL (ref 4.22–5.81)
RDW: 12.6 % (ref 11.5–15.5)
WBC: 6.4 10*3/uL (ref 4.0–10.5)

## 2014-01-15 LAB — GLUCOSE, CAPILLARY
GLUCOSE-CAPILLARY: 76 mg/dL (ref 70–99)
Glucose-Capillary: 161 mg/dL — ABNORMAL HIGH (ref 70–99)
Glucose-Capillary: 294 mg/dL — ABNORMAL HIGH (ref 70–99)
Glucose-Capillary: 133 mg/dL — ABNORMAL HIGH (ref 70–99)

## 2014-01-15 NOTE — Progress Notes (Addendum)
PROGRESS NOTE  Alanzo Carter UJW:[id number] DOB: Oct 04, 1950 DOA: 01/12/2014 PCP: [name], MD  HPI: [name] is an 64 y.o. male who awakened normal on Sunday 01/12/2014. He went to church and at church had the acute onset of left arm numbness at about 1045. His symptoms resolved after about 30 minutes. Later in the day at about 1500 01/12/2014 (LKW) he had numbness on the left side of his tongue and left side of his lower face. These symptoms lasted for a few hours. There was no associated facial droop or change in speech. Symptoms again spontaneously resolved. Patient presented to [facility] for evaluation. Work up at [facility] was unremarkable but patient was transferred here for further evaluation. Patient followed by cardiology. He has had a recent echocardiogram that showed a normal EF. His last carotid dopplers were in July of 2015 and showed minimal stenosis on the left and moderate stenosis on the right. Patient was not administered TPA secondary to resolution of symptoms. He was admitted for further evaluation and treatment.  Subjective Patient feels well today and feels that symptoms have resolved. He waits for stent placement tomorrow. He explained that he prefers carotid stent over carotid endarterectomy because he had a bad experience several years ago with thyroid surgery, intubation, bleeding, and an ICU stay. In short, he prefers a less invasive option.   Assessment/Plan: Principal Problem:  TIA (transient ischemic attack) Active Problems:  Aortic stenosis, moderate  Carotid stenosis, right  DM type 1 with diabetic peripheral neuropathy  Essential hypertension  Stroke  Left side numbness/TIA - Neurology consult 01/14/2014. - No acute finding on MRI brain, MRA head 01/13/2014. - Carotid Doppler showing worsening stenosis of right internal carotid artery 60-79 %. 01/13/2014. - 2-D echo done prior to admission showing grade 1 diastolic dysfunction. - LDL  was 211 on 01/13/2014, pt has allergy to statin. - Will have right internal carotid artery stent by Dr. [name] this Thursday AM. - Continue aspirin 81 mg po qd and Plavix.  Right internal carotid artery stenosis - Vascular surgery consult appreciated. - stenting by Dr. [name] Thursday a.m. - Continue aspirin 81 mg po qd and Plavix.   Diabetes mellitus - Patient has insulin pump, CBGs are uncontrolled in the 300s, patient reported earlier that site was not working right, he reinserted in a different site. - A1c is 9.5, generally uncontrolled, earlier provider discussed with patient that he needed better control of his glucose, patient has endocrinologist.  Hypertension - Blood pressure acceptable, continue to monitor.  Hyperlipidemia - Unfortunately has allergy to statin.  Iatrogenic hypothyroidism status post thyroidectomy - Continue with Synthroid 137 mcg PO QAC breakfast.  DVT Prophylaxis:  Heparin 5,000 units, [place], Q8H  Code Status: Full code Family Communication: None. Disposition Plan: Inpatient; discharge to home next 48 hours.   Procedures:  None today.  Carotid stent planned for tomorrow 01/16/2014.  Objective: Filed Vitals:   01/14/14 1426 01/14/14 1825 01/14/14 2218 01/15/14 0904  BP: 120/57 134/75 128/65 102/59  Pulse: 70 78 72 76  Temp: 97.6 F (36.4 C) 98.1 F (36.7 C) 98.1 F (36.7 C) 98 F (36.7 C)  TempSrc: Oral Oral Oral Oral  Resp: Height:      Weight:      SpO2: 96% 96% 97% 96%   No intake or output data in the 24 hours ending 01/15/14 1218 Filed Weights   01/14/14 0500  Weight: 96.616 kg (213 lb)   Exam: General: Well developed, well  nourished, NAD, appears stated age  HEENT:  Anicteic Sclera, MMM.   Neck: Supple, no JVD.  Cardiovascular: RRR, S1 S2 auscultated, no rubs, murmurs or gallops.   Respiratory: Clear to auscultation bilaterally with equal chest rise  Abdomen: Soft, nontender, nondistended. Extremities: warm dry  without cyanosis clubbing or edema.  Neuro: AAOx3. Strength 5/5 in upper and lower extremities  Psych: Normal affect and demeanor with intact judgement and insight  Data Reviewed: Basic Metabolic Panel:  Recent Labs Lab 01/12/14 1625 01/14/14 0528 01/15/14 0330  NA 133* 135 136  K 4.1 4.3 4.7  CL 99 99 101  CO2 24 29 30   GLUCOSE 286* 136* 120*  BUN 21 16 15   CREATININE 1.01 1.04 1.16  CALCIUM 8.9 8.6 8.7   Liver Function Tests:  Recent Labs Lab 01/12/14 1625  AST 23  ALT 18  ALKPHOS 81  BILITOT 0.5  PROT 7.0  ALBUMIN 3.8   CBC:  Recent Labs Lab 01/12/14 1625 01/14/14 0528 01/15/14 0330  WBC 6.9 6.9 6.4  NEUTROABS 4.1  --   --   HGB 14.3 14.9 14.1  HCT 40.1 42.6 39.6  MCV 86.6 89.1 86.7  PLT 213 202 180   Cardiac Enzymes:  Recent Labs Lab 01/12/14 1625  TROPONINI <0.03   CBG:  Recent Labs Lab 01/14/14 1137 01/14/14 1634 01/14/14 2214 01/15/14 0658 01/15/14 1126  GLUCAP 250* 79 188* 161* 294*   Studies: Ct Angio Neck W/cm &/or Wo/cm  01/13/2014   CLINICAL DATA:  64 year old male with episodes of left side numbness and slurred speech. Right carotid stenosis. Initial encounter.  EXAM: CT ANGIOGRAPHY NECK  TECHNIQUE: Multidetector CT imaging of the neck was performed using the standard protocol during bolus administration of intravenous contrast. Multiplanar CT image reconstructions and MIPs were obtained to evaluate the vascular anatomy. Carotid stenosis measurements (when applicable) are obtained utilizing NASCET criteria, using the distal internal carotid diameter as the denominator.  CONTRAST:  50mL OMNIPAQUE IOHEXOL 350 MG/ML SOLN  COMPARISON:  Brain MRI and intracranial MRA from 0145 hr today.  FINDINGS: Negative lung apices. No superior mediastinal lymphadenopathy. Small surgical clips at the thyroid bed, minimal residual thyroid parenchyma. Larynx, pharynx, parapharyngeal spaces, retropharyngeal space, sublingual space, submandibular glands, and  parotid glands are within normal limits. Stable orbits. Visualized paranasal sinuses and mastoids are clear. No acute osseous abnormality identified. No cervical lymphadenopathy.  VASCULAR FINDINGS:  Aortic arch: 3 vessel configuration. Minimal arch atherosclerosis and no great vessel origin stenosis.  Right carotid system: Negative right CCA proximal to the right carotid bifurcation. At the carotid bifurcation there is bulky mostly soft but partially calcified plaque posteriorly tracking into the right ICA origin and bulb. Subsequent high-grade stenosis (radiographic string sign series 404, image 90). However, the right ICA remains patent and is otherwise negative to the skullbase. There is extensive calcified right ICA siphon plaque.  Left carotid system: Negative left CCA proximal to the left carotid bifurcation. Mostly calcified plaque at the posterior left carotid bifurcation, left ICA origin and bulb. Subsequent left ICA stenosis of less than 50 % with respect to the distal vessel. beyond the bulb, the cervical left ICA is negative. Extensive calcified plaque in the visible left ICA siphon.  Vertebral arteries:Calcified right and soft left subclavian artery atherosclerosis but no hemodynamically significant proximal subclavian stenosis. Normal right vertebral artery origin. Negative right vertebral artery to the basilar. Normal right PICA origin.  Minimal calcified plaque at the left vertebral artery origin, no stenosis. Negative left vertebral  artery otherwise to the basilar. Dominant appearing left AICA. Negative visible basilar artery.  IMPRESSION: 1. Right ICA origin high-grade stenosis (radiographic string sign) related tube bulky mostly soft plaque in the posterior bifurcation and continuing to the right ICA bulb. 2. No left cervical carotid hemodynamically significant stenosis. Negative vertebral arteries. 3. Heavily calcified ICA siphons.   Electronically Signed   By: Augusto Gamble M.D.   On: 01/13/2014  19:44    Scheduled Meds: . aspirin  81 mg Oral Pre-Cath  . aspirin EC  81 mg Oral Daily  . clopidogrel  75 mg Oral Daily  . heparin  5,000 Units Subcutaneous 3 times per day  . insulin pump   Subcutaneous TID AC, HS, 0200  . levothyroxine  137 mcg Oral QAC breakfast  . pantoprazole  40 mg Oral Daily  . sodium chloride  3 mL Intravenous Q12H   Continuous Infusions:   Principal Problem:   TIA (transient ischemic attack) Active Problems:   Aortic stenosis, moderate   Carotid stenosis, right   DM type 1 with diabetic peripheral neuropathy   Essential hypertension   Stroke   Carotid stenosis    Jamison Neighbor, PA-S  [facility] Pager [phone]. If 7PM-7AM, please contact night-coverage at [email or web address], password [facility] 01/15/2014, 12:18 PM  LOS: 3 days     Sophiah Rolin M. Elvera Lennox, MD [facility] [phone]

## 2014-01-15 NOTE — Progress Notes (Signed)
UR completed 

## 2014-01-15 NOTE — Progress Notes (Signed)
Chaplain initiated visit with pt anticipating surgery tomorrow. Pt feels very calm about the procedure and is thankful that is was not a more severe stroke. Pt is supported by church family and wife. Pt is a person of faith. Chaplain offered to keep pt in her prayers. Page chaplain as needed.    01/15/14 1000  Clinical Encounter Type  Visited With Patient  Visit Type Initial;Spiritual support  Spiritual Encounters  Spiritual Needs Prayer;Emotional  Stress Factors  Patient Stress Factors Health changes  [name], Mayer MaskerCourtney F, Chaplain 01/15/2014 10:13 AM

## 2014-01-15 NOTE — Progress Notes (Addendum)
Inpatient Diabetes Program Recommendations  AACE/ADA: New Consensus Statement on Inpatient Glycemic Control (2013)  Target Ranges:  Prepandial:   less than 140 mg/dL      Peak postprandial:   less than 180 mg/dL (1-2 hours)      Critically ill patients:  140 - 180 mg/dL     Results for [name], [name] (MRN [id number]) as of 01/15/2014 13:53  Ref. Range 01/14/2014 06:57 01/14/2014 11:37 01/14/2014 16:34 01/14/2014 22:14  Glucose-Capillary Latest Range: 70-99 mg/dL 409119 (H) 811250 (H) 79 914188 (H)    Results for [name], [name] (MRN [id number]) as of 01/15/2014 13:53  Ref. Range 01/15/2014 06:58 01/15/2014 11:26  Glucose-Capillary Latest Range: 70-99 mg/dL 086161 (H) 578294 (H)    **Note patient scheduled for right internal carotid artery stenting first caseThursday morning.  **Currently using insulin pump to self-manage glucose levels.  Patient has history of Type 1 diabetes diagnosed at age 64 and is followed by Dr. Carlus Pavlovristina Gherghe with [facility].  Last saw Dr. Elvera LennoxGherghe on 11/12/13.  **Recommend that patient be allowed to use his insulin pump during his procedure tomorrow at the programmed basal rates.  Concern that if patient stops his insulin pump for the procedure that he could develop hyperglycemia quickly.  Please allow patient to wear his pump to the scheduled procedure but make sure patient places the insulin pump underneath his lead apron if imaging is done during the procedure.  **If for some reason patient removes his insulin pump for the procedure tomorrow, please have him reconnect insulin pump immediately after procedure.   Addendum 1410: Spoke with patient by phone.  Patient very knowledgeable about his insulin pump.  Explained to patient that if he wears his insulin pump to the procedure tomorrow to make sure he places the pump underneath the lead apron in case there are imaging studies done.  Told patient that if he removes his pump for the procedure that he should reconnect right  away after the procedure is complete.  Patient told me that if he removes his pump tomorrow, he will use new set/site immediately after procedure.  Has all needed insulin supplies at bedside.  Patient appreciative of my call.   Will follow Ambrose FinlandJeannine Johnston Imanie Darrow RN, MSN, CDE Diabetes Coordinator Inpatient Diabetes Program Team Pager: [phone] (8a-10p)

## 2014-01-15 NOTE — Progress Notes (Signed)
STROKE TEAM PROGRESS NOTE   HISTORY [name] is an 64 y.o. male who awakened normal today 01/12/2014. He went to church and at church had the acute onset of left arm numbness at about 1045. His symptoms resolved after about 30 minutes. Later in the day at about 1500 01/12/2014 (LKW) he had numbness on the left side of his tongue and left side of his lower face. These symptoms lasted for a few hours. There was no associated facial droop or change in speech. Symptoms again spontaneously resolved. Patient presented to [facility] for evaluation. Work up at [facility] was unremarkable but patient was transferred here for further evaluation. Patient followed by cardiology. He has had a recent echocardiogram that showed a normal EF. His last carotid dopplers were in July of 2015 and showed minimal stenosis on the left and moderate stenosis on the right. Patient was not administered TPA secondary to resolution of symptoms. He was admitted for further evaluation and treatment.   SUBJECTIVE (INTERVAL HISTORY) His wife is not at the bedside.  Overall he feels his condition is completely resolved. He awaits Rt carotid stent in am  OBJECTIVE Temp:  [97.6 F (36.4 C)-98.1 F (36.7 C)] 98 F (36.7 C) (01/13 0904) Pulse Rate:  [70-78] 76 (01/13 0904) Cardiac Rhythm:  [-]  Resp:  [18-20] 18 (01/13 0904) BP: (102-134)/(57-75) 102/59 mmHg (01/13 0904) SpO2:  [96 %-97 %] 96 % (01/13 0904)   Recent Labs Lab 01/14/14 1137 01/14/14 1634 01/14/14 2214 01/15/14 0658 01/15/14 1126  GLUCAP 250* 79 188* 161* 294*    Recent Labs Lab 01/12/14 1625 01/14/14 0528 01/15/14 0330  NA 133* 135 136  K 4.1 4.3 4.7  CL 99 99 101  CO2 GLUCOSE 286* 136* 120*  BUN CREATININE 1.01 1.04 1.16  CALCIUM 8.9 8.6 8.7    Recent Labs Lab 01/12/14 1625  AST 23  ALT 18  ALKPHOS 81  BILITOT 0.5  PROT 7.0  ALBUMIN 3.8    Recent Labs Lab 01/12/14 1625 01/14/14 0528 01/15/14 0330  WBC  6.9 6.9 6.4  NEUTROABS 4.1  --   --   HGB 14.3 14.9 14.1  HCT 40.1 42.6 39.6  MCV 86.6 89.1 86.7  PLT 213 202 180    Recent Labs Lab 01/12/14 1625  TROPONINI <0.03    Recent Labs  01/12/14 1625  LABPROT 12.2  INR 0.90    Recent Labs  01/12/14 1649  COLORURINE YELLOW  LABSPEC 1.029  PHURINE 5.5  GLUCOSEU >1000*  HGBUR NEGATIVE  BILIRUBINUR NEGATIVE  KETONESUR NEGATIVE  PROTEINUR NEGATIVE  UROBILINOGEN 1.0  NITRITE NEGATIVE  LEUKOCYTESUR NEGATIVE       Component Value Date/Time   CHOL 298* 01/13/2014 0623   TRIG 105 01/13/2014 0623   HDL 66 01/13/2014 0623   CHOLHDL 4.5 01/13/2014 0623   VLDL 21 01/13/2014 0623   LDLCALC 211* 01/13/2014 0623   Lab Results  Component Value Date   HGBA1C 9.5* 01/13/2014      Component Value Date/Time   LABOPIA NONE DETECTED 01/12/2014 1649   COCAINSCRNUR NONE DETECTED 01/12/2014 1649   LABBENZ NONE DETECTED 01/12/2014 1649   AMPHETMU NONE DETECTED 01/12/2014 1649   THCU NONE DETECTED 01/12/2014 1649   LABBARB NONE DETECTED 01/12/2014 1649     Recent Labs Lab 01/12/14 1625  ETH <5    Ct Angio Neck W/cm &/or Wo/cm  01/13/2014   CLINICAL DATA:  64 year old male with episodes of left side  numbness and slurred speech. Right carotid stenosis. Initial encounter.  EXAM: CT ANGIOGRAPHY NECK  TECHNIQUE: Multidetector CT imaging of the neck was performed using the standard protocol during bolus administration of intravenous contrast. Multiplanar CT image reconstructions and MIPs were obtained to evaluate the vascular anatomy. Carotid stenosis measurements (when applicable) are obtained utilizing NASCET criteria, using the distal internal carotid diameter as the denominator.  CONTRAST:  50mL OMNIPAQUE IOHEXOL 350 MG/ML SOLN  COMPARISON:  Brain MRI and intracranial MRA from 0145 hr today.  FINDINGS: Negative lung apices. No superior mediastinal lymphadenopathy. Small surgical clips at the thyroid bed, minimal residual thyroid  parenchyma. Larynx, pharynx, parapharyngeal spaces, retropharyngeal space, sublingual space, submandibular glands, and parotid glands are within normal limits. Stable orbits. Visualized paranasal sinuses and mastoids are clear. No acute osseous abnormality identified. No cervical lymphadenopathy.  VASCULAR FINDINGS:  Aortic arch: 3 vessel configuration. Minimal arch atherosclerosis and no great vessel origin stenosis.  Right carotid system: Negative right CCA proximal to the right carotid bifurcation. At the carotid bifurcation there is bulky mostly soft but partially calcified plaque posteriorly tracking into the right ICA origin and bulb. Subsequent high-grade stenosis (radiographic string sign series 404, image 90). However, the right ICA remains patent and is otherwise negative to the skullbase. There is extensive calcified right ICA siphon plaque.  Left carotid system: Negative left CCA proximal to the left carotid bifurcation. Mostly calcified plaque at the posterior left carotid bifurcation, left ICA origin and bulb. Subsequent left ICA stenosis of less than 50 % with respect to the distal vessel. beyond the bulb, the cervical left ICA is negative. Extensive calcified plaque in the visible left ICA siphon.  Vertebral arteries:Calcified right and soft left subclavian artery atherosclerosis but no hemodynamically significant proximal subclavian stenosis. Normal right vertebral artery origin. Negative right vertebral artery to the basilar. Normal right PICA origin.  Minimal calcified plaque at the left vertebral artery origin, no stenosis. Negative left vertebral artery otherwise to the basilar. Dominant appearing left AICA. Negative visible basilar artery.  IMPRESSION: 1. Right ICA origin high-grade stenosis (radiographic string sign) related tube bulky mostly soft plaque in the posterior bifurcation and continuing to the right ICA bulb. 2. No left cervical carotid hemodynamically significant stenosis. Negative  vertebral arteries. 3. Heavily calcified ICA siphons.   Electronically Signed   By: Augusto Gamble M.D.   On: 01/13/2014 19:44     PHYSICAL EXAM Pleasant  Obese middle aged Caucasian male not in distress.Awake alert. Afebrile. Head is nontraumatic. Neck is supple with soft right carotid bruit. Hearing is normal. Cardiac exam no murmur or gallop. Lungs are clear to auscultation. Distal pulses are well felt. Neurological Exam ;  Awake  Alert oriented x 3. Normal speech and language.eye movements full without nystagmus.fundi were not visualized. Vision acuity and fields appear normal. Hearing is normal. Palatal movements are normal. Face symmetric. Tongue midline. Normal strength, tone, reflexes and coordination. Normal sensation. Gait deferred .ASSESSMENT/PLAN [name] is a 64 y.o. male with history of type I diabetes and R ICA stenosis presenting with left sided numbness. He did not receive IV t-PA due to resolution of symptoms.   Right brain TIA, concern for R ICA involvement given known R ICA stenosis  Resultant  Left sided numbness resolved  MRI  No acute stroke  MRA  Unremarkable   Carotid Doppler 60-79& RICA stenosis but heavily calcified plaque may mask higher velocities  2D Echo Left ventricle: The cavity size was normal. Wall thickness was increased  in a pattern of mild LVH. Doppler parameters are consistent with abnormal left ventricular relaxation (grade 1 diastolic dysfunction).  Heparin 5000 units sq tid for VTE prophylaxis Diet NPO time specified Except for: Sips with Meds  Diet Carb Modified thin liquids  aspirin 81 mg orally every day prior to admission, now on aspirin 325 mg orally every day. ,ay recommend change to plavix - await carotid doppler prior to making recommendation as pt may need surgery/stent. Pt has been followed by Dr. Allyson SabalBerry.  Patient counseled to be compliant with his antithrombotic medications  Ongoing aggressive stroke risk factor  management  Therapy recommendations:  Home  Disposition:  Anticipate return home  Hypertension  Stable  Hyperlipidemia  Home meds:  Fish oil  LDL 211, goal < 70  Has been intolerant to statins in the past - has tried 3-4 different ones - it makes him less mentally sharpe and he needs to maintain his sharpness for his job.  Recommend statin or new injection that he can get as an OP. Have referred him to his primary MD as documentation of past fails is required.  At this time will not add statin due to intolerance.  Diabetes, type 1 (since 1963)  HgbA1c 8.9 in Nov 2015,, goal < 7.0  Uncontrolled  On insulin pump  Other Stroke Risk Factors  Former Cigarette smoker, quit in 1990  ETOH use  Hx TIA 1 year ago with expressive aphasia. He did not see a neurologist at that time. Saw his GP.  Obstructive sleep apnea, on CPAP at home  Hospital day # 3    Await Rt carotid PTA/stenting tomorrow. Continue aspirin plus plavix. Delia HeadyPramod Sethi, MD Medical Director [facility] Pager: [phone] 01/15/2014 1:47 PM    To contact Stroke Continuity provider, please refer to [email or web address]. After hours, contact General Neurology

## 2014-01-16 ENCOUNTER — Encounter (HOSPITAL_COMMUNITY)
Admission: EM | Disposition: A | Discharge status: Home / Self Care | Payer: PRIVATE HEALTH INSURANCE | Source: Home / Self Care | Attending: Internal Medicine

## 2014-01-16 DIAGNOSIS — E1065 Type 1 diabetes mellitus with hyperglycemia: Secondary | ICD-10-CM | POA: Diagnosis present

## 2014-01-16 DIAGNOSIS — I350 Nonrheumatic aortic (valve) stenosis: Secondary | ICD-10-CM | POA: Diagnosis present

## 2014-01-16 DIAGNOSIS — E10319 Type 1 diabetes mellitus with unspecified diabetic retinopathy without macular edema: Secondary | ICD-10-CM | POA: Diagnosis present

## 2014-01-16 DIAGNOSIS — G4733 Obstructive sleep apnea (adult) (pediatric): Secondary | ICD-10-CM | POA: Diagnosis present

## 2014-01-16 DIAGNOSIS — I10 Essential (primary) hypertension: Secondary | ICD-10-CM | POA: Diagnosis present

## 2014-01-16 DIAGNOSIS — K219 Gastro-esophageal reflux disease without esophagitis: Secondary | ICD-10-CM | POA: Diagnosis present

## 2014-01-16 DIAGNOSIS — Z87891 Personal history of nicotine dependence: Secondary | ICD-10-CM | POA: Diagnosis not present

## 2014-01-16 DIAGNOSIS — Z6827 Body mass index (BMI) 27.0-27.9, adult: Secondary | ICD-10-CM | POA: Diagnosis not present

## 2014-01-16 DIAGNOSIS — I6521 Occlusion and stenosis of right carotid artery: Secondary | ICD-10-CM | POA: Diagnosis present

## 2014-01-16 DIAGNOSIS — I959 Hypotension, unspecified: Secondary | ICD-10-CM | POA: Diagnosis not present

## 2014-01-16 DIAGNOSIS — E663 Overweight: Secondary | ICD-10-CM | POA: Diagnosis present

## 2014-01-16 DIAGNOSIS — Z7982 Long term (current) use of aspirin: Secondary | ICD-10-CM | POA: Diagnosis not present

## 2014-01-16 DIAGNOSIS — I2510 Atherosclerotic heart disease of native coronary artery without angina pectoris: Secondary | ICD-10-CM | POA: Diagnosis present

## 2014-01-16 DIAGNOSIS — E890 Postprocedural hypothyroidism: Secondary | ICD-10-CM | POA: Diagnosis present

## 2014-01-16 DIAGNOSIS — R200 Anesthesia of skin: Secondary | ICD-10-CM | POA: Diagnosis present

## 2014-01-16 DIAGNOSIS — R4781 Slurred speech: Secondary | ICD-10-CM | POA: Diagnosis present

## 2014-01-16 DIAGNOSIS — E785 Hyperlipidemia, unspecified: Secondary | ICD-10-CM | POA: Diagnosis present

## 2014-01-16 DIAGNOSIS — Z9641 Presence of insulin pump (external) (internal): Secondary | ICD-10-CM | POA: Diagnosis present

## 2014-01-16 HISTORY — PX: CAROTID STENT INSERTION: SHX5505

## 2014-01-16 LAB — POCT ACTIVATED CLOTTING TIME: Activated Clotting Time: 343 seconds

## 2014-01-16 LAB — GLUCOSE, CAPILLARY
GLUCOSE-CAPILLARY: 232 mg/dL — AB (ref 70–99)
GLUCOSE-CAPILLARY: 127 mg/dL — AB (ref 70–99)
Glucose-Capillary: 191 mg/dL — ABNORMAL HIGH (ref 70–99)
Glucose-Capillary: 190 mg/dL — ABNORMAL HIGH (ref 70–99)

## 2014-01-16 LAB — MRSA PCR SCREENING: MRSA by PCR: NEGATIVE

## 2014-01-16 SURGERY — CAROTID STENT INSERTION
Laterality: Right

## 2014-01-16 MED ORDER — ONDANSETRON HCL 4 MG/2ML IJ SOLN: 4.0000 mg | Freq: Four times a day (QID) | INTRAMUSCULAR | Status: DC | PRN

## 2014-01-16 MED ORDER — ACETAMINOPHEN 325 MG PO TABS: 650.0000 mg | ORAL_TABLET | ORAL | Status: DC | PRN

## 2014-01-16 MED ORDER — ASPIRIN EC 325 MG PO TBEC
325.0000 mg | DELAYED_RELEASE_TABLET | Freq: Every day | ORAL | Status: DC
  Administered 2014-01-17: 325 mg via ORAL
  Filled 2014-01-16: qty 1

## 2014-01-16 MED ORDER — CLOPIDOGREL BISULFATE 75 MG PO TABS: 75.0000 mg | ORAL_TABLET | Freq: Every day | ORAL | Status: DC

## 2014-01-16 MED ORDER — SODIUM CHLORIDE 0.9 % IV SOLN
INTRAVENOUS | Status: AC
  Administered 2014-01-16: 18:00:00 via INTRAVENOUS

## 2014-01-16 MED ORDER — ATROPINE SULFATE 0.1 MG/ML IJ SOLN
INTRAMUSCULAR | Status: AC
  Filled 2014-01-16: qty 10

## 2014-01-16 MED ORDER — HEPARIN (PORCINE) IN NACL 2-0.9 UNIT/ML-% IJ SOLN
INTRAMUSCULAR | Status: AC
  Filled 2014-01-16: qty 1000

## 2014-01-16 MED ORDER — NOREPINEPHRINE BITARTRATE 1 MG/ML IV SOLN
INTRAVENOUS | Status: AC
  Filled 2014-01-16: qty 4

## 2014-01-16 MED ORDER — BIVALIRUDIN 250 MG IV SOLR
INTRAVENOUS | Status: AC
  Filled 2014-01-16: qty 250

## 2014-01-16 MED ORDER — LIDOCAINE HCL (PF) 1 % IJ SOLN
INTRAMUSCULAR | Status: AC
  Filled 2014-01-16: qty 30

## 2014-01-16 NOTE — H&P (View-Only) (Signed)
   Reason for Consult: Symptomatic RICA stenosis for CAS  Requesting Physician: Sethi  HPI:  [name] is a 63 -year-old mildly overweight married Caucasian male, father of 3 and grandfather to 3 grandchildren, whom I initially saw October 12, 2011.I last saw him in the office 6 months ago He has a history of insulin-dependent diabetes on insulin pump, mild sleep apnea, and hyperlipidemia as well as mild aortic stenosis. He had a Myoview stress test that was abnormal which led to a heart catheterization performed by Dr. Kelly in my absence revealing 70% disease in all 3 vessels with preserved LV function and mild AS. He also has moderate right ICA stenosis. He is neurologically asymptomatic. He was placed on beta-blocker and Ranexa. Apparently he is having mood swings, which may be a side effect of one of his medications.since I saw him 6 months ago he denies chest pain or shortness of breath. He has experienced some long-term memory loss and has had this worked up with an MRI of his brain. A concern this may be related to his statin therapy. We did recheck a 2-D echo which showed preserved LV function with mild progression of his aortic stenosis. Carotid Dopplers showed stable moderate right ICA stenosis. He was admitted to [facility] with 2 sequential TIAs. Carotid Dopplers did show moderate right ICA stenosis. The patient was seen by Dr. Sethi, stroke neurologist, asked Dr. Chen, vascular surgeon for consultation. Dr. Chen ordered a CTA which showed high-grade ostial right internal carotid artery stenosis. I am asked to see the patient for consideration of carotid artery stenting. The patient was offered carotid endarterectomy. Nathaniel. Carter apparently had a bad experience remotely being intubated at the time of the parathyroid operation and does not want to be reintubated again if at all possible making carotid stenting the best option for revascularization.   Problem List: Patient Active  Problem List   Diagnosis Date Noted  . Stroke   . TIA (transient ischemic attack) 01/12/2014  . Pre-syncope 05/13/2013  . Essential hypertension 03/18/2013  . Trochanteric bursitis of left hip 01/18/2013  . Preventative health care 01/18/2013  . OSA (obstructive sleep apnea)   . Mild cognitive impairment 05/09/2012  . DM type 1 with diabetic peripheral neuropathy 05/09/2012  . CAD (coronary artery disease) 01/11/2012  . Abnormal nuclear cardiac imaging test, 10/2011 10/28/2011  . Chronic venous insufficiency 09/28/2011  . 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 Aortic stenosis, moderate 09/28/2011  . Carotid stenosis, right 09/28/2011  . Colon cancer screening 04/21/2011  . Fatigue 04/21/2011  . Hyperlipidemia 03/21/2011  . Diabetic peripheral neuropathy 01/19/2011  . Restless legs syndrome 01/19/2011  . Hypothyroidism 01/13/2011    PMHx:  Past Medical History  Diagnosis Date  . Diabetes mellitus Dx'd age 11    Novolog via insulin pump; sub-optimal control x years  . High cholesterol     Takes 1/2 of 80mg atorvastatin daily  . Hypothyroidism     Thyroidectomy 2002; multinodular goiter  . Erectile dysfunction     Se[place]rologist in [place]  . Diabetic retinopathy     Proliferative: Hx of retinal detachment on right-light perception only in right eye  . History of tobacco abuse     Quit about 1990 (has 35 pack-yr hx)  . OSA (obstructive sleep apnea)     06/2011 sleep study: moderate OSA, CPAP at 12 cm H2O.  . Impaired vision     right eye light perception only; hx of retinal detachment.  .   Recurrent pneumonia   . GERD (gastroesophageal reflux disease)   . Venous insufficiency   . Diastolic dysfunction 2007    TEE with diastolic dysfunction, EF 74%.  . Diabetic neuropathy     fine touch and position sense affected  . Aortic stenosis, mild Sept/Oct /2013    Mild/mod by echo; mild by cath 10/28/11.  Mild progression by echo 05/2012--f/u echo annually recommended (patient  asymptomatic).  . Carotid stenosis, right 09/2011    50-70% stenosis, stable on repeat dopplers 05/2012 but left sided velocities increased 01/2013. Dr. Azuree Minish suggests f/u doppler in 6 mo.  . 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  Med mgmt recommended.  . Abnormal stress test 10/27/2011    lat isch--cardiac cath 10/28/2011  . Venous reflux 10/14/2011    venous doppler-R GSV continuous reflux throughout; too small for VNUS closure  . Decreased pedal pulses     LE doppler 11/08/11- no evidence of arterial insufficiency   Past Surgical History  Procedure Laterality Date  . Thyroid surgery  2002  . Lasix eye    . Eye surgery      Multiple laser surgeries for diabetic retinopathy, also cataract surgery [facility].  . Retinal detachment surgery      Right eye  . Cataract extraction      left  . Transthoracic echocardiogram  10/2011;12/2012    Mod AS, mild LVH, EF 60-65%, no RWMA-being followed by Dr. Huxton Glaus  . Cardiac catheterization  10/2011    EF normal.  Diffuse 3 vessel CAD, no stents placed.  Medical mgmt per [facility].  . Left and right heart catheterization with coronary angiogram N/A 10/28/2011    Procedure: LEFT AND R0IHT HEART CATHETERIZATION WITH CORONARY ANGIOGRAM;  Surgeon: Thomas A Kelly, MD;  Location: [facility] CATH LAB;  Service: Cardiovascular;  Laterality: N/A;    FAMHx: Family History  Problem Relation Age of Onset  . Cancer Mother     lung cancer  . Alcohol abuse Father   . Cancer Father     laryngeal cancer  . Cancer Brother     oldest brother had lung cancer and melanoma    SOCHx:  reports that he has quit smoking. He quit smokeless tobacco use about 26 years ago. He reports that he drinks alcohol. He reports that he does not use illicit drugs.  ALLERGIES: Allergies  Allergen Reactions  . Statins Other (See Comments)    Problem with higher dosages (Lipitor caused  memory issues), also caused leg pains and lethargy  . Tape Rash    Adhesive tape.  (Paper tape OK)    ROS: Pertinent items are noted in HPI.  HOME MEDICATIONS: Prescriptions prior to admission  Medication Sig Dispense Refill Last Dose  . aspirin EC 81 MG tablet Take 81 mg by mouth daily.   01/12/2014 at Unknown time  . clotrimazole-betamethasone (LOTRISONE) cream Apply to affected area twice daily as needed for rash (Patient taking differently: Apply 1 application topically 2 (two) times daily as needed (rash). ) 15 g 2 01/09/2014  . Feverfew 380 MG CAPS Take 380 mg by mouth daily. For migraine headaches   01/12/2014 at Unknown time  . Insulin Human (INSULIN PUMP) SOLN Inject into the skin continuous. Basal rate .95/hr, current pump settings: 12am .45, 2am .65, 7am .95, 11am .95, 5pm .75, 11pm .5   01/13/2014 at Unknown time  . levothyroxine (SYNTHROID,   LEVOTHROID) 137 MCG tablet Take 1 tablet (137 mcg total) by mouth daily. 90 tablet 0 01/12/2014 at Unknown time  . Omega-3 Fatty Acids (FISH OIL) 1000 MG CAPS Take 1,000 mg by mouth daily.   01/12/2014 at Unknown time  . ranitidine (ZANTAC) 150 MG tablet Take 150 mg by mouth daily as needed for heartburn.   01/11/2014  . tadalafil (CIALIS) 20 MG tablet Take 20 mg by mouth daily as needed for erectile dysfunction.    2 months ago  . glucagon (GLUCAGON EMERGENCY) 1 MG injection Inject 1 mg into the muscle once as needed. (Patient not taking: Reported on 01/13/2014) 2 each prn Not Taking at Unknown time  . Insulin Human (INSULIN PUMP) 100 unit/ml SOLN Inject 1 each into the skin continuous. Novolog - ~50 units a day. (Patient not taking: Reported on 01/13/2014) 60 each 3 Not Taking at Unknown time  . NOVOLOG 100 UNIT/ML injection PUMP PARAMETERS: BASAL RATE 0.8 U/H, MEAL: 1:11, 1:12, 1:15 BF, L, D, 1 U/75 ABOVE 130 (Patient not taking: Reported on 01/13/2014) 60 mL 1 Not Taking at Unknown time    HOSPITAL MEDICATIONS: I have reviewed the patient's current  medications.  VITALS: Blood pressure 107/49, pulse 80, temperature 97.8 F (36.6 C), temperature source Oral, resp. rate 18, height 6' 2" (1.88 m), weight 213 lb (96.616 kg), SpO2 95 %.  INPUT/OUTPUT        PHYSICAL EXAM: General appearance: alert and no distress Neck: no adenopathy, no JVD, supple, symmetrical, trachea midline, thyroid not enlarged, symmetric, no tenderness/mass/nodules and bilateral carotid bruits Lungs: clear to auscultation bilaterally Heart: soft outflow tract murmur Extremities: extremities normal, atraumatic, no cyanosis or edema  LABS:  BMP  Recent Labs  07/17/13 0826 01/12/14 1625 01/14/14 0528  NA 135 133* 135  K 4.6 4.1 4.3  CL 100 99 99  CO2 30 24 29  GLUCOSE 254* 286* 136*  BUN 15 21 16  CREATININE 0.9 1.01 1.04  CALCIUM 8.8 8.9 8.6  GFRNONAA  --  76* 73*  GFRAA  --  88* 85*    CBC  Recent Labs Lab 01/14/14 0528  WBC 6.9  RBC 4.78  HGB 14.9  HCT 42.6  PLT 202  MCV 89.1    HEMOGLOBIN A1C Lab Results  Component Value Date   HGBA1C 9.5* 01/13/2014   MPG 226* 01/13/2014    Cardiac Panel (last 3 results)  Recent Labs  01/12/14 1625  TROPONINI <0.03    BNP (last 3 results) No results for input(s): PROBNP in the last 8760 hours.  TSH  Recent Labs  01/15/13 0839 07/17/13 0826  TSH 3.16 6.16*    CHOLESTEROL  Recent Labs  05/14/13 0803 01/13/14 0623  CHOL 264* 298*    Hepatic Function Panel  Recent Labs  05/14/13 0803 01/12/14 1625  PROT 6.5 7.0  ALBUMIN 3.8 3.8  AST 18 23  ALT 16 18  ALKPHOS 75 81  BILITOT 0.8 0.5  BILIDIR 0.2  --   IBILI 0.6  --     IMAGING: Ct Head Wo Contrast  01/12/2014   CLINICAL DATA:  Numbness, slurred speech  EXAM: CT HEAD WITHOUT CONTRAST  TECHNIQUE: Contiguous axial images were obtained from the base of the skull through the vertex without intravenous contrast.  COMPARISON:  MRI brain 06/02/2012  FINDINGS: There is no evidence of mass effect, midline shift or  extra-axial fluid collections. There is no evidence of a space-occupying lesion or intracranial hemorrhage. There is no evidence of   a cortical-based area of acute infarction.  The ventricles and sulci are appropriate for the patient's age. The basal cisterns are patent.  Visualized portions of the orbits are unremarkable. The visualized portions of the paranasal sinuses and mastoid air cells are unremarkable.  The osseous structures are unremarkable.  IMPRESSION: Normal CT of the brain without intravenous contrast.   Electronically Signed   By: Hetal  Patel   On: 01/12/2014 16:53   Ct Angio Neck W/cm &/or Wo/cm  01/13/2014   CLINICAL DATA:  63-year-old male with episodes of left side numbness and slurred speech. Right carotid stenosis. Initial encounter.  EXAM: CT ANGIOGRAPHY NECK  TECHNIQUE: Multidetector CT imaging of the neck was performed using the standard protocol during bolus administration of intravenous contrast. Multiplanar CT image reconstructions and MIPs were obtained to evaluate the vascular anatomy. Carotid stenosis measurements (when applicable) are obtained utilizing NASCET criteria, using the distal internal carotid diameter as the denominator.  CONTRAST:  50mL OMNIPAQUE IOHEXOL 350 MG/ML SOLN  COMPARISON:  Brain MRI and intracranial MRA from 0145 hr today.  FINDINGS: Negative lung apices. No superior mediastinal lymphadenopathy. Small surgical clips at the thyroid bed, minimal residual thyroid parenchyma. Larynx, pharynx, parapharyngeal spaces, retropharyngeal space, sublingual space, submandibular glands, and parotid glands are within normal limits. Stable orbits. Visualized paranasal sinuses and mastoids are clear. No acute osseous abnormality identified. No cervical lymphadenopathy.  VASCULAR FINDINGS:  Aortic arch: 3 vessel configuration. Minimal arch atherosclerosis and no great vessel origin stenosis.  Right carotid system: Negative right CCA proximal to the right carotid bifurcation. At  the carotid bifurcation there is bulky mostly soft but partially calcified plaque posteriorly tracking into the right ICA origin and bulb. Subsequent high-grade stenosis (radiographic string sign series 404, image 90). However, the right ICA remains patent and is otherwise negative to the skullbase. There is extensive calcified right ICA siphon plaque.  Left carotid system: Negative left CCA proximal to the left carotid bifurcation. Mostly calcified plaque at the posterior left carotid bifurcation, left ICA origin and bulb. Subsequent left ICA stenosis of less than 50 % with respect to the distal vessel. beyond the bulb, the cervical left ICA is negative. Extensive calcified plaque in the visible left ICA siphon.  Vertebral arteries:Calcified right and soft left subclavian artery atherosclerosis but no hemodynamically significant proximal subclavian stenosis. Normal right vertebral artery origin. Negative right vertebral artery to the basilar. Normal right PICA origin.  Minimal calcified plaque at the left vertebral artery origin, no stenosis. Negative left vertebral artery otherwise to the basilar. Dominant appearing left AICA. Negative visible basilar artery.  IMPRESSION: 1. Right ICA origin high-grade stenosis (radiographic string sign) related tube bulky mostly soft plaque in the posterior bifurcation and continuing to the right ICA bulb. 2. No left cervical carotid hemodynamically significant stenosis. Negative vertebral arteries. 3. Heavily calcified ICA siphons.   Electronically Signed   By: Lee  Hall M.D.   On: 01/13/2014 19:44   Nathaniel Brain Wo Contrast  01/13/2014   CLINICAL DATA:  Two episodes of LEFT-sided numbness today, gradually resolved. Possible slurred speech. Known RIGHT carotid artery stenosis.  EXAM: MRI HEAD WITHOUT CONTRAST  MRA HEAD WITHOUT CONTRAST  TECHNIQUE: Multiplanar, multiecho pulse sequences of the brain and surrounding structures were obtained without intravenous contrast.  Angiographic images of the head were obtained using MRA technique without contrast.  COMPARISON:  CT of the head January 12, 2014 at 1641 hr and MRI of the head Jun 02, 2012  FINDINGS: MRI HEAD FINDINGS    The ventricles and sulci are normal for patient's age. No abnormal parenchymal signal, mass lesions, mass effect. No reduced diffusion to suggest acute ischemia. A few scattered subcentimeter foci of T2 hyperintense signal, less than expected for age. No susceptibility artifact to suggest hemorrhage.  No abnormal extra-axial fluid collections. No extra-axial masses though, contrast enhanced sequences would be more sensitive. Normal major intracranial vascular flow voids seen at the skull base.  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 No abnormal sellar expansion. Visualized paranasal sinuses and mastoid air cells are well-aerated. No suspicious calvarial bone marrow signal. No abnormal sellar expansion. Craniocervical junction maintained.  MRA HEAD FINDINGS  Anterior circulation: Normal flow related enhancement of the included cervical, petrous, cavernous and supra clinoid internal carotid arteries. Patent anterior communicating artery. Normal flow related enhancement of the anterior and middle cerebral arteries, including more distal segments. Supernumerary anterior cerebral artery arising from LEFT A1 2 junction. Very mild intracranial mid to distal vessel luminal irregularity.  No large vessel occlusion, hemodynamically significant stenosis, aneurysm.  Posterior circulation: LEFT vertebral artery is dominant. Basilar artery is patent, with normal flow related enhancement of the main branch vessels. Normal flow related enhancement of the posterior cerebral arteries. RIGHT posterior communicating artery flow void present.  No large vessel occlusion, hemodynamically significant stenosis, aneurysm.  IMPRESSION: MRI HEAD: No acute intracranial process, specifically no acute  ischemia. Normal noncontrast MRI of the brain for age.  MRA HEAD: No acute vascular process. Mild luminal irregularity can be seen with intracranial atherosclerosis without stenosis.   Electronically Signed   By: Courtnay  Bloomer   On: 01/13/2014 02:57   Nathaniel Mra Head/brain Wo Cm  01/13/2014   CLINICAL DATA:  Two episodes of LEFT-sided numbness today, gradually resolved. Possible slurred speech. Known RIGHT carotid artery stenosis.  EXAM: MRI HEAD WITHOUT CONTRAST  MRA HEAD WITHOUT CONTRAST  TECHNIQUE: Multiplanar, multiecho pulse sequences of the brain and surrounding structures were obtained without intravenous contrast. Angiographic images of the head were obtained using MRA technique without contrast.  COMPARISON:  CT of the head January 12, 2014 at 1641 hr and MRI of the head Jun 02, 2012  FINDINGS: MRI HEAD FINDINGS  The ventricles and sulci are normal for patient's age. No abnormal parenchymal signal, mass lesions, mass effect. No reduced diffusion to suggest acute ischemia. A few scattered subcentimeter foci of T2 hyperintense signal, less than expected for age. No susceptibility artifact to suggest hemorrhage.  No abnormal extra-axial fluid collections. No extra-axial masses though, contrast enhanced sequences would be more sensitive. Normal major intracranial vascular flow voids seen at the skull base.  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 No abnormal sellar expansion. Visualized paranasal sinuses and mastoid air cells are well-aerated. No suspicious calvarial bone marrow signal. No abnormal sellar expansion. Craniocervical junction maintained.  MRA HEAD FINDINGS  Anterior circulation: Normal flow related enhancement of the included cervical, petrous, cavernous and supra clinoid internal carotid arteries. Patent anterior communicating artery. Normal flow related enhancement of the anterior and middle cerebral arteries, including more distal segments.  Supernumerary anterior cerebral artery arising from LEFT A1 2 junction. Very mild intracranial mid to distal vessel luminal irregularity.  No large vessel occlusion, hemodynamically significant stenosis, aneurysm.  Posterior circulation: LEFT vertebral artery is dominant. Basilar artery is patent, with normal flow related enhancement of the main branch vessels. Normal flow related enhancement of the posterior cerebral arteries. RIGHT posterior communicating artery flow void present.    No large vessel occlusion, hemodynamically significant stenosis, aneurysm.  IMPRESSION: MRI HEAD: No acute intracranial process, specifically no acute ischemia. Normal noncontrast MRI of the brain for age.  MRA HEAD: No acute vascular process. Mild luminal irregularity can be seen with intracranial atherosclerosis without stenosis.   Electronically Signed   By: Courtnay  Bloomer   On: 01/13/2014 02:57    IMPRESSION: 1. Symptomatic right internal carotid artery stenosis-we have been following [name] carotid duplex in the office which has remained moderate in severity and stable. He presented with TIAs in the right carotid distribution with Nathaniel I/MRAs that did not show stroke. He was evaluated by Dr. Sethi felt that he needed revascularization and given the patient's preference to not be intubated carotid artery stenting would be the most appropriate option. I have gone over in detail the procedure with the patient and his wife including risk and benefits and agreed to proceed. 2. Moderate aortic stenosis-currently stable and asymptomatic 3. Moderate coronary artery disease-documented by cardiac catheterization and currently asymptomatic 4. Insulin-dependent diabetes 5. Hyperlipidemia   RECOMMENDATION: 1. I'm going to perform right internal carotid artery stenting first case Thursday morning. I have told Dr. Elgergawy to load [name] was 600 mg of Plavix by mouth today followed by 75 mg daily. He is already on  aspirin.  Time Spent Directly with Patient: 45 minutes  Jeziah Kretschmer J 01/14/2014, 11:39 AM 

## 2014-01-16 NOTE — Progress Notes (Signed)
Report given to Pat/lunch relief

## 2014-01-16 NOTE — Interval H&P Note (Signed)
History and Physical Interval Note:  01/16/2014 7:40 AM  [name]  has presented today for surgery, with the diagnosis of carotid stenosis  The various methods of treatment have been discussed with the patient and family. After consideration of risks, benefits and other options for treatment, the patient has consented to  Procedure(s): CAROTID STENT INSERTION (N/A) as a surgical intervention .  The patient's history has been reviewed, patient examined, no change in status, stable for surgery.  I have reviewed the patient's chart and labs.  Questions were answered to the patient's satisfaction.     Runell GessBERRY,Solana Coggin J

## 2014-01-16 NOTE — Progress Notes (Signed)
Dr. Allyson SabalBerry and Dr. Pearlean BrownieSethi by to see patient. Neuro assessment: speech clear and appropriate; upper and lower extremities equal bilaterally in strength; smile symmetrical, tongue midline; lt pupil reacts briskly to light. Rt pupil has little reaction to light(patient has had retinal detachment and states he has little vision in rt eye). States he can feel where stent was placed in carotid artery. Alert and oriented person, place and time.

## 2014-01-16 NOTE — Progress Notes (Signed)
STROKE TEAM PROGRESS NOTE   HISTORY [name] is an 64 y.o. male who awakened normal today 01/12/2014. He went to church and at church had the acute onset of left arm numbness at about 1045. His symptoms resolved after about 30 minutes. Later in the day at about 1500 01/12/2014 (LKW) he had numbness on the left side of his tongue and left side of his lower face. These symptoms lasted for a few hours. There was no associated facial droop or change in speech. Symptoms again spontaneously resolved. Patient presented to [facility] for evaluation. Work up at [facility] was unremarkable but patient was transferred here for further evaluation. Patient followed by cardiology. He has had a recent echocardiogram that showed a normal EF. His last carotid dopplers were in July of 2015 and showed minimal stenosis on the left and moderate stenosis on the right. Patient was not administered TPA secondary to resolution of symptoms. He was admitted for further evaluation and treatment.   SUBJECTIVE (INTERVAL HISTORY) His wife is not at the bedside. Patient examined in cardiology recovery room. He had  Rt carotid stent this am and is doing well.  OBJECTIVE Temp:  [97.5 F (36.4 C)-98 F (36.7 C)] 97.5 F (36.4 C) (01/14 0709) Pulse Rate:  [70-84] 70 (01/14 0955) Cardiac Rhythm:  [-] Normal sinus rhythm (01/13 2015) Resp:  [17-18] 17 (01/14 0955) BP: (97-141)/(49-77) 105/49 mmHg (01/14 0955) SpO2:  [94 %-98 %] 95 % (01/14 0955)   Recent Labs Lab 01/15/14 1126 01/15/14 1642 01/15/14 2142 01/16/14 0659 01/16/14 0921  GLUCAP 294* 76 133* 232* 191*    Recent Labs Lab 01/12/14 1625 01/14/14 0528 01/15/14 0330  NA 133* 135 136  K 4.1 4.3 4.7  CL 99 99 101  CO2 GLUCOSE 286* 136* 120*  BUN CREATININE 1.01 1.04 1.16  CALCIUM 8.9 8.6 8.7    Recent Labs Lab 01/12/14 1625  AST 23  ALT 18  ALKPHOS 81  BILITOT 0.5  PROT 7.0  ALBUMIN 3.8    Recent Labs Lab  01/12/14 1625 01/14/14 0528 01/15/14 0330  WBC 6.9 6.9 6.4  NEUTROABS 4.1  --   --   HGB 14.3 14.9 14.1  HCT 40.1 42.6 39.6  MCV 86.6 89.1 86.7  PLT 213 202 180    Recent Labs Lab 01/12/14 1625  TROPONINI <0.03   No results for input(s): LABPROT, INR in the last 72 hours. No results for input(s): COLORURINE, LABSPEC, PHURINE, GLUCOSEU, HGBUR, BILIRUBINUR, KETONESUR, PROTEINUR, UROBILINOGEN, NITRITE, LEUKOCYTESUR in the last 72 hours.  Invalid input(s): APPERANCEUR     Component Value Date/Time   CHOL 298* 01/13/2014 0623   TRIG 105 01/13/2014 0623   HDL 66 01/13/2014 0623   CHOLHDL 4.5 01/13/2014 0623   VLDL 21 01/13/2014 0623   LDLCALC 211* 01/13/2014 0623   Lab Results  Component Value Date   HGBA1C 9.5* 01/13/2014      Component Value Date/Time   LABOPIA NONE DETECTED 01/12/2014 1649   COCAINSCRNUR NONE DETECTED 01/12/2014 1649   LABBENZ NONE DETECTED 01/12/2014 1649   AMPHETMU NONE DETECTED 01/12/2014 1649   THCU NONE DETECTED 01/12/2014 1649   LABBARB NONE DETECTED 01/12/2014 1649     Recent Labs Lab 01/12/14 1625  ETH <5    No results found.   PHYSICAL EXAM Pleasant  Obese middle aged Caucasian male not in distress.Awake alert. Afebrile. Head is nontraumatic. Neck is supple with soft right carotid bruit. Hearing is normal. Cardiac  exam no murmur or gallop. Lungs are clear to auscultation. Distal pulses are well felt. Neurological Exam ;  Awake  Alert oriented x 3. Normal speech and language.eye movements full without nystagmus.fundi were not visualized. Vision acuity and fields appear normal. Hearing is normal. Palatal movements are normal. Face symmetric. Tongue midline. Normal strength, tone, reflexes and coordination. Normal sensation. Gait deferred .ASSESSMENT/PLAN Mr. Theodore DemarkRichard Kai is a 64 y.o. male with history of type I diabetes and R ICA stenosis presenting with left sided numbness. He did not receive IV t-PA due to resolution of symptoms.    Right brain TIA, concern for R ICA involvement given known R ICA stenosis  Resultant  Left sided numbness resolved  MRI  No acute stroke  MRA  Unremarkable   Carotid Doppler 60-79& RICA stenosis but heavily calcified plaque may mask higher velocities  2D Echo Left ventricle: The cavity size was normal. Wall thickness was increased in a pattern of mild LVH. Doppler parameters are consistent with abnormal left ventricular relaxation (grade 1 diastolic dysfunction).  Heparin 5000 units sq tid for VTE prophylaxis    thin liquids  aspirin 81 mg orally every day prior to admission, now on aspirin 325 mg orally every day. ,ay recommend change to plavix - await carotid doppler prior to making recommendation as pt may need surgery/stent. Pt has been followed by Dr. Allyson SabalBerry.  Patient counseled to be compliant with his antithrombotic medications  Ongoing aggressive stroke risk factor management  Therapy recommendations:  Home  Disposition:  Anticipate return home  Hypertension  Stable  Hyperlipidemia  Home meds:  Fish oil  LDL 211, goal < 70  Has been intolerant to statins in the past - has tried 3-4 different ones - it makes him less mentally sharpe and he needs to maintain his sharpness for his job.  Recommend statin or new injection that he can get as an OP. Have referred him to his primary MD as documentation of past fails is required.  At this time will not add statin due to intolerance.  Diabetes, type 1 (since 1963)  HgbA1c 8.9 in Nov 2015,, goal < 7.0  Uncontrolled  On insulin pump  Other Stroke Risk Factors  Former Cigarette smoker, quit in 1990  ETOH use  Hx TIA 1 year ago with expressive aphasia. He did not see a neurologist at that time. Saw his GP.  Obstructive sleep apnea, on CPAP at home  Hospital day # 4    S/P successful  PTA/stenting today. Continue aspirin plus plavix.mobilize gradually as tolerated. Hopefully discharge in am Delia HeadyPramod  Hafiz Irion, MD Medical Director [facility] Pager: [phone] 01/16/2014 12:06 PM    To contact Stroke Continuity provider, please refer to [email or web address]. After hours, contact General Neurology

## 2014-01-16 NOTE — Progress Notes (Signed)
Site area: RFA Site Prior to Removal:  Level 0 Pressure Applied For:30 min Manual:   yes Patient Status During Pull:  stable Post Pull Site:  Level 0 Post Pull Instructions Given:  yes Post Pull Pulses Present: palpable Dressing Applied:  clear Bedrest begins @ 1140 Comments:

## 2014-01-16 NOTE — Progress Notes (Signed)
Pt shows firm and equal upper and lower limb strength. Rt fem arterial sheath sutured in place  With site level zero. RT PT and DP palpable.

## 2014-01-16 NOTE — Progress Notes (Signed)
PROGRESS NOTE  Nathaniel DemarkRichard Frances ZOX:[id number] DOB: 05/21/1950 DOA: 01/12/2014 PCP: Jeoffrey MassedMCGOWEN,PHILIP H, MD  HPI: Nathaniel DemarkRichard Brickner is an 64 y.o. male who awakened normal on Sunday 01/12/2014. He went to church and at church had the acute onset of left arm numbness at about 1045. His symptoms resolved after about 30 minutes. Later in the day at about 1500 01/12/2014 (LKW) he had numbness on the left side of his tongue and left side of his lower face. These symptoms lasted for a few hours. There was no associated facial droop or change in speech. Symptoms again spontaneously resolved. [facility] but patient was transferred here for further evaluation. Patient followed by cardiology. He has had a recent echocardiogram that showed a normal EF. His last carotid dopplers were in July of 2015 and showed minimal stenosis on the left and moderate stenosis on the right. Patient was not administered TPA secondary to resolution of symptoms. He was admitted for further evaluation and treatment.  Subjective Patient seen after stenting, somewhat hypotensive post procedure and on bedrest, asymptomatic.   Assessment/Plan: Principal Problem:  TIA (transient ischemic attack) Active Problems:  Aortic stenosis, moderate  Carotid stenosis, right  DM type 1 with diabetic peripheral neuropathy  Essential hypertension  Stroke  Left side numbness/TIA - Neurology consult 01/14/2014. - No acute finding on MRI brain, MRA head 01/13/2014. - Carotid Doppler showing worsening stenosis of right internal carotid artery 60-79 %. 01/13/2014. - 2-D echo done prior to admission showing grade 1 diastolic dysfunction. - LDL was 211 on 01/13/2014, pt has allergy to statin. - s/p right internal carotid artery stent 1/14 - Continue aspirin 81 mg po qd and Plavix.  Right internal carotid artery stenosis - s/p stenting - Continue aspirin 81 mg po qd and  Plavix.   Diabetes mellitus - Patient has insulin pump, CBGs are uncontrolled in the 300s, patient reported earlier that site was not working right, he reinserted in a different site. - A1c is 9.5, generally uncontrolled, earlier provider discussed with patient that he needed better control of his glucose, patient has endocrinologist.  Hypertension - hypotensive following procedure, closely monitor  Hyperlipidemia - Unfortunately has allergy to statin.  Iatrogenic hypothyroidism status post thyroidectomy - Continue with Synthroid 137 mcg PO QAC breakfast.  DVT Prophylaxis:  Heparin 5,000 units, [place], Q8H  Code Status: Full code Family Communication: None. Disposition Plan: Inpatient; discharge to home when stable  Procedures:  Carotid stent 01/16/2014.  Objective: Filed Vitals:   01/16/14 1430 01/16/14 1500 01/16/14 1600 01/16/14 1633  BP: 79/45 99/48 96/54    Pulse: 64 64 62   Temp:   97.3 F (36.3 C)   TempSrc:   Oral   Resp: 15 13 18    Height:      Weight:      SpO2: 97% 98% 97% 99%    Intake/Output Summary (Last 24 hours) at 01/16/14 1645 Last data filed at 01/16/14 1600  Gross per 24 hour  Intake    300 ml  Output      0 ml  Net    300 ml   Filed Weights   01/14/14 0500 01/16/14 1420  Weight: 96.616 kg (213 lb) 96.616 kg (213 lb)   Exam: General: Well developed, well nourished, NAD, appears stated age  HEENT:  Anicteic Sclera, MMM.   Neck: Supple, no JVD.  Cardiovascular: RRR, S1 S2 auscultated, no rubs, murmurs or gallops.   Respiratory: Clear to auscultation  bilaterally with equal chest rise  Abdomen: Soft, nontender, nondistended. Extremities: warm dry without cyanosis clubbing or edema.  Neuro: AAOx3. Strength 5/5 in upper and lower extremities  Psych: Normal affect and demeanor with intact judgement and insight  Data Reviewed: Basic Metabolic Panel:  Recent Labs Lab 01/12/14 1625 01/14/14 0528 01/15/14 0330  NA 133* 135 136  K 4.1 4.3 4.7    CL 99 99 101  CO2 GLUCOSE 286* 136* 120*  BUN CREATININE 1.01 1.04 1.16  CALCIUM 8.9 8.6 8.7   Liver Function Tests:  Recent Labs Lab 01/12/14 1625  AST 23  ALT 18  ALKPHOS 81  BILITOT 0.5  PROT 7.0  ALBUMIN 3.8   CBC:  Recent Labs Lab 01/12/14 1625 01/14/14 0528 01/15/14 0330  WBC 6.9 6.9 6.4  NEUTROABS 4.1  --   --   HGB 14.3 14.9 14.1  HCT 40.1 42.6 39.6  MCV 86.6 89.1 86.7  PLT 213 202 180   Cardiac Enzymes:  Recent Labs Lab 01/12/14 1625  TROPONINI <0.03   CBG:  Recent Labs Lab 01/15/14 1126 01/15/14 1642 01/15/14 2142 01/16/14 0659 01/16/14 0921  GLUCAP 294* 76 133* 232* 191*   Studies: No results found.  Scheduled Meds: . [START ON 01/17/2014] aspirin EC  325 mg Oral Daily  . clopidogrel  75 mg Oral Daily  . insulin pump   Subcutaneous TID AC, HS, 0200  . levothyroxine  137 mcg Oral QAC breakfast  . pantoprazole  40 mg Oral Daily   Continuous Infusions: . sodium chloride 100 mL/hr at 01/16/14 5409    Principal Problem:   TIA (transient ischemic attack) Active Problems:   Aortic stenosis, moderate   Carotid stenosis, right   DM type 1 with diabetic peripheral neuropathy   Essential hypertension   Stroke   Carotid stenosis   Carotid occlusion, right  Farouk Vivero M. Elvera Lennox, MD [facility] [phone]  If 7PM-7AM, please contact night-coverage at [email or web address], password [facility] 01/16/2014, 4:45 PM  LOS: 4 days

## 2014-01-16 NOTE — Progress Notes (Signed)
o2 sat 97. Sat probe removed at pt's request.

## 2014-01-16 NOTE — CV Procedure (Signed)
Shannon Kirkendall is a 64 y.o. male    [id number] LOCATION:  FACILITY: [facility]  PHYSICIAN: Nanetta Batty, M.D. [birth year]   DATE OF PROCEDURE:  01/16/2014  DATE OF DISCHARGE:     PV Angiogram/Intervention    History obtained from chart review.Mr Parkerson is a 5 -year-old mildly overweight married Caucasian male, father of 3 and grandfather to 3 grandchildren, whom I initially saw October 12, 2011.  He has a history of insulin-dependent diabetes on insulin pump, mild sleep apnea, and hyperlipidemia as well as mild aortic stenosis. He had a Myoview stress test that was abnormal which led to a heart catheterization performed by Dr. Tresa Endo in my absence revealing 70% disease in all 3 vessels with preserved LV function and mild AS. He also had moderate right ICA stenosis. He was neurologically asymptomatic. He was placed on beta-blocker and Ranexa Carotid Dopplers showed stable moderate right ICA stenosis. He was admitted to [facility] with 2 sequential TIAs. Carotid Dopplers did show moderate right ICA stenosis. The patient was seen by Dr. Pearlean Brownie, stroke neurologist, asked Dr. Imogene Burn, vascular surgeon for consultation. Dr. Imogene Burn ordered a CTA which showed high-grade ostial right internal carotid artery stenosis. I am asked to see the patient for consideration of carotid artery stenting. The patient was offered carotid endarterectomy. [name] apparently had a bad experience remotely being intubated at the time of the parathyroid operation and does not want to be reintubated again if at all possible making carotid stenting the best option for revascularization. Mr. Helvey presents now for right internal carotid artery stenting for symptomatic high risk ostial right ICA stenosis.  Operators: Dr. Runell Gess, Dr. Durene Cal    PROCEDURE DESCRIPTION:   The patient was brought to the second floor [place] [facility] cath lab in the postabsorptive state. He was not premedicated . His right  groinwas prepped and shaved in usual sterile fashion. Xylocaine 1% was used  for local anesthesia. A 5 French sheath was inserted into the right common femoral artery using standard Seldinger technique. A 5 French pigtail catheter was placed in the aortic aortic arch. Aortic arch angiography was performed in the LAO view using 30 mL of contrast. A 5 French JB1 catheter was used for right carotid angiography obtaining intra-and extracranial views. The intracranial angiograms will be interpreted by neuro interventional radiology. Anticipate the entirety of the case. Retrograde aortic pressure was monitored during the case.   HEMODYNAMICS:    AO SYSTOLIC/AO DIASTOLIC: 181/92   Angiographic Data:   1: Arch aortogram-this was a type I arch  2: Right carotid artery-there was an 80% focal, fluoroscopically calcified, ostial right internal carotid artery stenosis.  IMPRESSION:[name] has a high-grade ostial right internal carotid artery stenosis with a stenosis severity graded in the 80+ percent range. We will proceed with PTA and stenting using appetite nitinol self-expanding stent and a NAV 6 distal protection device with Angiomax anticoagulation. The patient is on dual antiplatelet therapy.  Procedure Description:the 5 French sheath was exchanged over the 0.25 cm Versicore wire for a 6 French 90 cm long shuttle sheath. The ACT was measured at 343. Total contrast administered during the case was 200 mL. Using a JB1 catheter I gained access to the right common carotid artery. This was done over a 0.35 Versicore wire. I then advanced the sterile sheath over the catheter and placed it approximately 3 cm proximal to the stenosis. Following this a large distal protection filter (NAV 6)  was then advanced across  the lesion into the distal  Extracranial carotid artery. Predilatation was performed with a 3 mm x 2 cm balloon and a 7/9 mm x 3 cm Abbott Nitinol Exact  Self expanding stent was then carefully  positioned fluoroscopically and deployed. Prior to postdilatation 1 mg of atropine was administered intravenously. The stent was then postdilated with a 5 mm x 2 cm balloon at nominal pressures resulting reduction of an 80+ percent ostial right internal carotid artery stenosis to 0% residual with excellent flow and no dissection. The patient did become somewhat hypotensive and was given IV fluid resuscitation. Intravenous pressors were not administered. He was asymptomatic at the end of the case except for some mild pressure in his neck. The shuttle sheath was then exchanged over the same wire for a short 6 French sheath and the patient left the lab in stable condition. The sheath will be removed and pressure held in several hours. He will remain recumbent for 6 hours thereafter and hydrated. He will be discharged home in the morning.  Final Impression: successful right internal carotid artery PTA and stenting using distal protection for an 80+ percent ostial lesion in a symptomatic patient at high risk for endarterectomy. The patient tolerated the procedure well. He'll be discharged home in the morning on Ancef therapy and we will obtain carotid Dopplers in [place] office next week and I will see him back the week after for follow-up.    Runell GessBERRY,Orlene Salmons J. MD, [facility] 01/16/2014 9:12 AM

## 2014-01-16 NOTE — Progress Notes (Signed)
UR completed 

## 2014-01-17 ENCOUNTER — Encounter (HOSPITAL_COMMUNITY): Payer: Self-pay | Admitting: Cardiovascular Disease

## 2014-01-17 DIAGNOSIS — G458 Other transient cerebral ischemic attacks and related syndromes: Secondary | ICD-10-CM

## 2014-01-17 LAB — BASIC METABOLIC PANEL WITH GFR
Anion gap: 6 (ref 5–15)
BUN: 16 mg/dL (ref 6–23)
CO2: 28 mmol/L (ref 19–32)
Calcium: 8 mg/dL — ABNORMAL LOW (ref 8.4–10.5)
Chloride: 103 meq/L (ref 96–112)
Creatinine, Ser: 1.13 mg/dL (ref 0.50–1.35)
GFR calc Af Amer: 77 mL/min — ABNORMAL LOW
GFR calc non Af Amer: 66 mL/min — ABNORMAL LOW
Glucose, Bld: 180 mg/dL — ABNORMAL HIGH (ref 70–99)
Potassium: 4.2 mmol/L (ref 3.5–5.1)
Sodium: 137 mmol/L (ref 135–145)

## 2014-01-17 LAB — CBC
HCT: 36.9 % — ABNORMAL LOW (ref 39.0–52.0)
Hemoglobin: 12.7 g/dL — ABNORMAL LOW (ref 13.0–17.0)
MCH: 30.2 pg (ref 26.0–34.0)
MCHC: 34.4 g/dL (ref 30.0–36.0)
MCV: 87.9 fL (ref 78.0–100.0)
PLATELETS: 171 10*3/uL (ref 150–400)
RBC: 4.2 MIL/uL — ABNORMAL LOW (ref 4.22–5.81)
RDW: 12.8 % (ref 11.5–15.5)
WBC: 7 10*3/uL (ref 4.0–10.5)

## 2014-01-17 LAB — GLUCOSE, CAPILLARY
Glucose-Capillary: 234 mg/dL — ABNORMAL HIGH (ref 70–99)
Glucose-Capillary: 285 mg/dL — ABNORMAL HIGH (ref 70–99)

## 2014-01-17 MED ORDER — ASPIRIN 325 MG PO TBEC: 325.0000 mg | DELAYED_RELEASE_TABLET | Freq: Every day | ORAL | Status: DC

## 2014-01-17 MED ORDER — CLOPIDOGREL BISULFATE 75 MG PO TABS: 75.0000 mg | ORAL_TABLET | Freq: Every day | ORAL | Status: DC

## 2014-01-17 MED FILL — Sodium Chloride IV Soln 0.9%: INTRAVENOUS | Qty: 50 | Status: AC

## 2014-01-17 NOTE — Progress Notes (Signed)
STROKE TEAM PROGRESS NOTE   HISTORY [name] is an 64 y.o. male who awakened normal today 01/12/2014. He went to church and at church had the acute onset of left arm numbness at about 1045. His symptoms resolved after about 30 minutes. Later in the day at about 1500 01/12/2014 (LKW) he had numbness on the left side of his tongue and left side of his lower face. These symptoms lasted for a few hours. There was no associated facial droop or change in speech. Symptoms again spontaneously resolved. Patient presented to [facility] for evaluation. Work up at [facility] was unremarkable but patient was transferred here for further evaluation. Patient followed by cardiology. He has had a recent echocardiogram that showed a normal EF. His last carotid dopplers were in July of 2015 and showed minimal stenosis on the left and moderate stenosis on the right. Patient was not administered TPA secondary to resolution of symptoms. He was admitted for further evaluation and treatment.   SUBJECTIVE (INTERVAL HISTORY) His wife is not at the bedside.continue sto have fluctuating low BP and mild orthostasis. No neurological deficits OBJECTIVE Temp:  [97.3 F (36.3 C)-98.3 F (36.8 C)] 98.3 F (36.8 C) (01/15 0700) Pulse Rate:  [62-72] 72 (01/15 0700) Cardiac Rhythm:  [-] Normal sinus rhythm (01/15 0800) Resp:  [11-29] 17 (01/15 0827) BP: (71-129)/(40-55) 129/49 mmHg (01/15 0827) SpO2:  [93 %-99 %] 94 % (01/15 0700) Weight:  [213 lb (96.616 kg)] 213 lb (96.616 kg) (01/14 1420)   Recent Labs Lab 01/16/14 0659 01/16/14 0921 01/16/14 1634 01/16/14 2018 01/17/14 0729  GLUCAP 232* 191* 190* 127* 234*    Recent Labs Lab 01/12/14 1625 01/14/14 0528 01/15/14 0330 01/17/14 0308  NA 133* 135 136 137  K 4.1 4.3 4.7 4.2  CL 99 99 101 103  CO2 GLUCOSE 286* 136* 120* 180*  BUN CREATININE 1.01 1.04 1.16 1.13  CALCIUM 8.9 8.6 8.7 8.0*    Recent Labs Lab 01/12/14 1625  AST  23  ALT 18  ALKPHOS 81  BILITOT 0.5  PROT 7.0  ALBUMIN 3.8    Recent Labs Lab 01/12/14 1625 01/14/14 0528 01/15/14 0330 01/17/14 0308  WBC 6.9 6.9 6.4 7.0  NEUTROABS 4.1  --   --   --   HGB 14.3 14.9 14.1 12.7*  HCT 40.1 42.6 39.6 36.9*  MCV 86.6 89.1 86.7 87.9  PLT 213 202 180 171    Recent Labs Lab 01/12/14 1625  TROPONINI <0.03   No results for input(s): LABPROT, INR in the last 72 hours. No results for input(s): COLORURINE, LABSPEC, PHURINE, GLUCOSEU, HGBUR, BILIRUBINUR, KETONESUR, PROTEINUR, UROBILINOGEN, NITRITE, LEUKOCYTESUR in the last 72 hours.  Invalid input(s): APPERANCEUR     Component Value Date/Time   CHOL 298* 01/13/2014 0623   TRIG 105 01/13/2014 0623   HDL 66 01/13/2014 0623   CHOLHDL 4.5 01/13/2014 0623   VLDL 21 01/13/2014 0623   LDLCALC 211* 01/13/2014 0623   Lab Results  Component Value Date   HGBA1C 9.5* 01/13/2014      Component Value Date/Time   LABOPIA NONE DETECTED 01/12/2014 1649   COCAINSCRNUR NONE DETECTED 01/12/2014 1649   LABBENZ NONE DETECTED 01/12/2014 1649   AMPHETMU NONE DETECTED 01/12/2014 1649   THCU NONE DETECTED 01/12/2014 1649   LABBARB NONE DETECTED 01/12/2014 1649     Recent Labs Lab 01/12/14 1625  ETH <5    No results found.   PHYSICAL EXAM Pleasant  Obese  middle aged Caucasian male not in distress.Awake alert. Afebrile. Head is nontraumatic. Neck is supple with soft right carotid bruit. Hearing is normal. Cardiac exam no murmur or gallop. Lungs are clear to auscultation. Distal pulses are well felt. Neurological Exam ;  Awake  Alert oriented x 3. Normal speech and language.eye movements full without nystagmus.fundi were not visualized. Vision acuity and fields appear normal. Hearing is normal. Palatal movements are normal. Face symmetric. Tongue midline. Normal strength, tone, reflexes and coordination. Normal sensation. Gait deferred .ASSESSMENT/PLAN [name] is a 64 y.o. male with history of  type I diabetes and R ICA stenosis presenting with left sided numbness. He did not receive IV t-PA due to resolution of symptoms.   Right brain TIA, symptomatic from known R ICA stenosis s/p RICA stent 01/16/14  Resultant  Left sided numbness resolved  MRI  No acute stroke  MRA  Unremarkable   Carotid Doppler 60-79& RICA stenosis but heavily calcified plaque may mask higher velocities  2D Echo Left ventricle: The cavity size was normal. Wall thickness was increased in a pattern of mild LVH. Doppler parameters are consistent with abnormal left ventricular relaxation (grade 1 diastolic dysfunction).  Heparin 5000 units sq tid for VTE prophylaxis  Diet Carb Modified thin liquids  aspirin 81 mg orally every day prior to admission, now on aspirin 325 mg orally every day. ,ay recommend change to plavix - await carotid doppler prior to making recommendation as pt may need surgery/stent. Pt has been followed by Dr. Allyson SabalBerry.  Patient counseled to be compliant with his antithrombotic medications  Ongoing aggressive stroke risk factor management  Therapy recommendations:  Home  Disposition:  Anticipate return home  Hypertension  Stable  Hyperlipidemia  Home meds:  Fish oil  LDL 211, goal < 70  Has been intolerant to statins in the past - has tried 3-4 different ones - it makes him less mentally sharpe and he needs to maintain his sharpness for his job.  Recommend statin or new injection that he can get as an OP. Have referred him to his primary MD as documentation of past fails is required.  At this time will not add statin due to intolerance.  Diabetes, type 1 (since 1963)  HgbA1c 8.9 in Nov 2015,, goal < 7.0  Uncontrolled  On insulin pump  Other Stroke Risk Factors  Former Cigarette smoker, quit in 1990  ETOH use  Hx TIA 1 year ago with expressive aphasia. He did not see a neurologist at that time. Saw his GP.  Obstructive sleep apnea, on CPAP at  home  Hospital day # 5    Mobilize as tolerated. Orthostatic tolerance exercises. Continue aspirin plus plavix.mobilize gradually as tolerated. Hopefully discharge in next 1-2 days. Stroke team will sign off. Follow up as outpatient in 2 months. Call for questions. Delia HeadyPramod Bina Veenstra, MD Medical Director [facility] Pager: [phone] 01/17/2014 10:05 AM    To contact Stroke Continuity provider, please refer to [email or web address]. After hours, contact General Neurology

## 2014-01-17 NOTE — Evaluation (Signed)
Physical Therapy Evaluation and D/C Patient Details Name: [name] MRN: [id number] DOB: [birth year] Today's Date: 01/17/2014   History of Present Illness  Pt admit for TIA.  Had right ICA stent.  Orthostatic hypotension post op.    Clinical Impression  Pt admitted with above diagnosis. Pt currently without significant functional limitations and reports he is approaching his baseline status with a little residual weakness that does not affect function.  Pt will no longer need skilled PT. Will sign off.    Follow Up Recommendations No PT follow up    Equipment Recommendations  None recommended by PT    Recommendations for Other Services       Precautions / Restrictions Precautions Precautions: None Restrictions Weight Bearing Restrictions: No      Mobility  Bed Mobility Overal bed mobility: Independent                Transfers Overall transfer level: Independent                  Ambulation/Gait Ambulation/Gait assistance: Independent Ambulation Distance (Feet): 450 Feet Assistive device: None Gait Pattern/deviations: Step-through pattern;Decreased stride length   Gait velocity interpretation: <1.8 ft/sec, indicative of risk for recurrent falls General Gait Details: Pt ambulated well with challenges without LOB.  Did have a little dizziness with head looking up and down.  May have vertigo issue but do not want to test at present due to recent procedure.  Educated pt that if he continues to have issues with dizziness with head nods to f/u once healed at Outpt PT.  Pt understands.    Stairs Stairs: Yes Stairs assistance: Supervision Stair Management: One rail Right;Alternating pattern;Forwards Number of Stairs: 13 General stair comments: No difficulties on stairs.    Wheelchair Mobility    Modified Rankin (Stroke Patients Only) Modified Rankin (Stroke Patients Only) Pre-Morbid Rankin Score: No symptoms Modified Rankin: Moderate disability      Balance Overall balance assessment: Needs assistance;History of Falls         Standing balance support: No upper extremity supported;During functional activity Standing balance-Leahy Scale: Good               High level balance activites: Turns;Sudden stops;Head turns;Direction changes High Level Balance Comments: Can do all of above without LOB.              Pertinent Vitals/Pain Pain Assessment: No/denies pain  BP 106/55 after walk.  95/42 standing.  No dizziness throughout evaluation.      Home Living Family/patient expects to be discharged to:: Private residence Living Arrangements: Spouse/significant other Available Help at Discharge: Family;Available 24 hours/day Type of Home: House Home Access: Stairs to enter Entrance Stairs-Rails: None Entrance Stairs-Number of Steps: 3 Home Layout: Two level Home Equipment: None      Prior Function Level of Independence: Independent               Hand Dominance        Extremity/Trunk Assessment   Upper Extremity Assessment: Defer to OT evaluation           Lower Extremity Assessment: Generalized weakness      Cervical / Trunk Assessment: Normal  Communication   Communication: No difficulties  Cognition Arousal/Alertness: Awake/alert Behavior During Therapy: WFL for tasks assessed/performed Overall Cognitive Status: Within Functional Limits for tasks assessed                      General Comments  Exercises General Exercises - Lower Extremity Ankle Circles/Pumps: AROM;Both;10 reps;Seated Long Arc Quad: AROM;Both;10 reps;Seated Hip Flexion/Marching: AROM;Both;10 reps;Seated      Assessment/Plan    PT Assessment Patent does not need any further PT services  PT Diagnosis Generalized weakness   PT Problem List    PT Treatment Interventions     PT Goals (Current goals can be found in the Care Plan section) Acute Rehab PT Goals PT Goal Formulation: All assessment and  education complete, DC therapy    Frequency     Barriers to discharge        Co-evaluation               End of Session Equipment Utilized During Treatment: Gait belt Activity Tolerance: Patient tolerated treatment well Patient left: in chair;with call bell/phone within reach Nurse Communication: Mobility status         Time: [id number] PT Time Calculation (min) (ACUTE ONLY): 23 min   Charges:   PT Evaluation $Initial PT Evaluation Tier I: 1 Procedure PT Treatments $Gait Training: 8-22 mins   PT G CodesBerline Lopes:        Madinah Quarry F 01/17/2014, 1:26 PM Laporsha Grealish,PT Acute Rehabilitation [phone] (pager)

## 2014-01-17 NOTE — Discharge Summary (Signed)
Physician Discharge Summary  [name]:[id number] DOB: 1950-01-07 DOA: 01/12/2014  PCP: [name], MD  Admit date: 01/12/2014 Discharge date: 01/17/2014  Time spent: 45 minutes  Recommendations for Outpatient Follow-up:  1. Follow up with Dr. [name] in a week 2. Follow up with Dr. Pearlean Brownie in 1 month 3. Follow up with PCP in 1-2 weeks 4. Follow up with Dr. Elvera Lennox as scheduled   Discharge Diagnoses:  Principal Problem:   TIA (transient ischemic attack) Active Problems:   Aortic stenosis, moderate   Carotid stenosis, right   DM type 1 with diabetic peripheral neuropathy   Essential hypertension   Stroke   Carotid stenosis   Carotid occlusion, right  Discharge Condition: stable  Diet recommendation: heart healthy, diabetic  Filed Weights   01/14/14 0500 01/16/14 1420  Weight: 96.616 kg (213 lb) 96.616 kg (213 lb)   History of present illness:  [name] is a 64 y.o. male with atherosclerotic vascular disease including a known 50-69% stenosis of his RIGHT carotid artery at the bulb and first part of the ICA. Patient presents to [facility] today after 2 episodes of LEFT sided numbness. He states the first episode onset at 23 AM, was located in his left arm, was of quality "numbness and tingling", this resolved. Then later at 3:30 PM today he developed LEFT facial numbness of jaw and tongue which prompted him to go to the ED. Symptoms gradually improved to resolved. Felt like his speech was slightly slurred. No vision changes, no gait abnormalities or weakness.  Hospital Course:  Patient was admitted to the neurology floor with left-sided numbness and concern for CVA. He underwent an MRI brain on 1/11 which was negative for acute findings, carotid Doppler showed worsening stenosis of the right internal carotid artery on 1/11. Given constellation of symptoms and his stenosis, it was felt like he would benefit from either right carotid endarterectomy or stent  placement. After discussing with cardiology, and given patient's personal history of difficulties with intubation and a prolonged ICU stay in the past, he elected against carotid endarterectomy and chose to have internal carotid artery stent placement. This was done on 1/14, and patient was monitored in step down afterwards. He recovered well post procedurally, was initially hypotensive into the 70-80s systolic, he was monitored overnight after the procedure. The next day, his blood pressure has improved, patient was able to work with the physical therapy and felt like he return close to his normal self. He was discharged home in stable condition, with close outpatient follow-up with his cardiologist, neurologist, and PCP. Neurology will was consulted and have followed patient while hospitalized. He will be discharged home on dual antiplatelet therapy with aspirin and Plavix. Patient's diabetes was controlled by use of his home pump, his hemoglobin A1c was found to be elevated at 9.5, he has close outpatient follow-up with his endocrinologist. His other medical problems have been stable during this hospitalization, and no further medication changes were made to his chronic regimen.  Procedures:  Right ICA stenting  Consultations:  Cardiology  Neurology  Discharge Exam: Filed Vitals:   01/17/14 1136 01/17/14 1138 01/17/14 1200 01/17/14 1600  BP: 106/55 95/42  119/32  Pulse:    67  Temp:    97.5 F (36.4 C)  TempSrc:    Oral  Resp:   16 18  Height:      Weight:      SpO2:   97% 97%   General: NAD Cardiovascular: RRR Respiratory: CTA biL  Discharge Instructions     Medication List    TAKE these medications        aspirin 325 MG EC tablet  Take 1 tablet (325 mg total) by mouth daily.     clopidogrel 75 MG tablet  Commonly known as:  PLAVIX  Take 1 tablet (75 mg total) by mouth daily.     clotrimazole-betamethasone cream  Commonly known as:  LOTRISONE  Apply to affected area  twice daily as needed for rash     Feverfew 380 MG Caps  Take 380 mg by mouth daily. For migraine headaches     Fish Oil 1000 MG Caps  Take 1,000 mg by mouth daily.     glucagon 1 MG injection  Commonly known as:  GLUCAGON EMERGENCY  Inject 1 mg into the muscle once as needed.     insulin pump Soln  Inject into the skin continuous. Basal rate .95/hr, current pump settings: 12am .45, 2am .65, 7am .95, 11am .95, 5pm .75, 11pm .5     insulin pump Soln  Inject 1 each into the skin continuous. Novolog - ~50 units a day.     levothyroxine 137 MCG tablet  Commonly known as:  SYNTHROID, LEVOTHROID  Take 1 tablet (137 mcg total) by mouth daily.     NOVOLOG 100 UNIT/ML injection  Generic drug:  insulin aspart  PUMP PARAMETERS: BASAL RATE 0.8 U/H, MEAL: 1:11, 1:12, 1:15 BF, L, D, 1 U/75 ABOVE 130     ranitidine 150 MG tablet  Commonly known as:  ZANTAC  Take 150 mg by mouth daily as needed for heartburn.     tadalafil 20 MG tablet  Commonly known as:  CIALIS  Take 20 mg by mouth daily as needed for erectile dysfunction.           Follow-up Information    Follow up with SETHI,PRAMOD, MD In 2 months.   Specialties:  Neurology, Radiology   Why:  stroke clinic, office will call you for follow up appointment   Contact information:   [address] [place] [address] [phone]       Follow up with [name], MD. Schedule an appointment as soon as possible for a visit in 3 weeks.   Specialty:  Family Medicine   Contact information:   [address] [place] [address] [phone]       Follow up with Runell Gess, MD. Schedule an appointment as soon as possible for a visit in 1 week.   Specialty:  Cardiology   Contact information:   [address] [place] [address] [phone]       The results of significant diagnostics from this hospitalization (including imaging, microbiology, ancillary and laboratory) are listed  below for reference.    Significant Diagnostic Studies: Ct Head Wo Contrast  01/12/2014   CLINICAL DATA:  Numbness, slurred speech  EXAM: CT HEAD WITHOUT CONTRAST  TECHNIQUE: Contiguous axial images were obtained from the base of the skull through the vertex without intravenous contrast.  COMPARISON:  MRI brain 06/02/2012  FINDINGS: There is no evidence of mass effect, midline shift or extra-axial fluid collections. There is no evidence of a space-occupying lesion or intracranial hemorrhage. There is no evidence of a cortical-based area of acute infarction.  The ventricles and sulci are appropriate for the patient's age. The basal cisterns are patent.  Visualized portions of the orbits are unremarkable. The visualized portions of the paranasal sinuses and mastoid  air cells are unremarkable.  The osseous structures are unremarkable.  IMPRESSION: Normal CT of the brain without intravenous contrast.   Electronically Signed   By: Elige KoHetal  Patel   On: 01/12/2014 16:53   Ct Angio Neck W/cm &/or Wo/cm  01/13/2014   CLINICAL DATA:  64 year old male with episodes of left side numbness and slurred speech. Right carotid stenosis. Initial encounter.  EXAM: CT ANGIOGRAPHY NECK  TECHNIQUE: Multidetector CT imaging of the neck was performed using the standard protocol during bolus administration of intravenous contrast. Multiplanar CT image reconstructions and MIPs were obtained to evaluate the vascular anatomy. Carotid stenosis measurements (when applicable) are obtained utilizing NASCET criteria, using the distal internal carotid diameter as the denominator.  CONTRAST:  50mL OMNIPAQUE IOHEXOL 350 MG/ML SOLN  COMPARISON:  Brain MRI and intracranial MRA from 0145 hr today.  FINDINGS: Negative lung apices. No superior mediastinal lymphadenopathy. Small surgical clips at the thyroid bed, minimal residual thyroid parenchyma. Larynx, pharynx, parapharyngeal spaces, retropharyngeal space, sublingual space, submandibular glands,  and parotid glands are within normal limits. Stable orbits. Visualized paranasal sinuses and mastoids are clear. No acute osseous abnormality identified. No cervical lymphadenopathy.  VASCULAR FINDINGS:  Aortic arch: 3 vessel configuration. Minimal arch atherosclerosis and no great vessel origin stenosis.  Right carotid system: Negative right CCA proximal to the right carotid bifurcation. At the carotid bifurcation there is bulky mostly soft but partially calcified plaque posteriorly tracking into the right ICA origin and bulb. Subsequent high-grade stenosis (radiographic string sign series 404, image 90). However, the right ICA remains patent and is otherwise negative to the skullbase. There is extensive calcified right ICA siphon plaque.  Left carotid system: Negative left CCA proximal to the left carotid bifurcation. Mostly calcified plaque at the posterior left carotid bifurcation, left ICA origin and bulb. Subsequent left ICA stenosis of less than 50 % with respect to the distal vessel. beyond the bulb, the cervical left ICA is negative. Extensive calcified plaque in the visible left ICA siphon.  Vertebral arteries:Calcified right and soft left subclavian artery atherosclerosis but no hemodynamically significant proximal subclavian stenosis. Normal right vertebral artery origin. Negative right vertebral artery to the basilar. Normal right PICA origin.  Minimal calcified plaque at the left vertebral artery origin, no stenosis. Negative left vertebral artery otherwise to the basilar. Dominant appearing left AICA. Negative visible basilar artery.  IMPRESSION: 1. Right ICA origin high-grade stenosis (radiographic string sign) related tube bulky mostly soft plaque in the posterior bifurcation and continuing to the right ICA bulb. 2. No left cervical carotid hemodynamically significant stenosis. Negative vertebral arteries. 3. Heavily calcified ICA siphons.   Electronically Signed   By: Augusto GambleLee  Hall M.D.   On:  01/13/2014 19:44   Mr Brain Wo Contrast  01/13/2014   CLINICAL DATA:  Two episodes of LEFT-sided numbness today, gradually resolved. Possible slurred speech. Known RIGHT carotid artery stenosis.  EXAM: MRI HEAD WITHOUT CONTRAST  MRA HEAD WITHOUT CONTRAST  TECHNIQUE: Multiplanar, multiecho pulse sequences of the brain and surrounding structures were obtained without intravenous contrast. Angiographic images of the head were obtained using MRA technique without contrast.  COMPARISON:  CT of the head January 12, 2014 at 1641 hr and MRI of the head Jun 02, 2012  FINDINGS: MRI HEAD FINDINGS  The ventricles and sulci are normal for patient's age. No abnormal parenchymal signal, mass lesions, mass effect. No reduced diffusion to suggest acute ischemia. A few scattered subcentimeter foci of T2 hyperintense signal, less than expected for age. No  susceptibility artifact to suggest hemorrhage.  No abnormal extra-axial fluid collections. No extra-axial masses though, contrast enhanced sequences would be more sensitive. Normal major intracranial vascular flow voids seen at the skull base.  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 No abnormal sellar expansion. Visualized paranasal sinuses and mastoid air cells are well-aerated. No suspicious calvarial bone marrow signal. No abnormal sellar expansion. Craniocervical junction maintained.  MRA HEAD FINDINGS  Anterior circulation: Normal flow related enhancement of the included cervical, petrous, cavernous and supra clinoid internal carotid arteries. Patent anterior communicating artery. Normal flow related enhancement of the anterior and middle cerebral arteries, including more distal segments. Supernumerary anterior cerebral artery arising from LEFT A1 2 junction. Very mild intracranial mid to distal vessel luminal irregularity.  No large vessel occlusion, hemodynamically significant stenosis, aneurysm.  Posterior circulation: LEFT  vertebral artery is dominant. Basilar artery is patent, with normal flow related enhancement of the main branch vessels. Normal flow related enhancement of the posterior cerebral arteries. RIGHT posterior communicating artery flow void present.  No large vessel occlusion, hemodynamically significant stenosis, aneurysm.  IMPRESSION: MRI HEAD: No acute intracranial process, specifically no acute ischemia. Normal noncontrast MRI of the brain for age.  MRA HEAD: No acute vascular process. Mild luminal irregularity can be seen with intracranial atherosclerosis without stenosis.   Electronically Signed   By: Awilda Metro   On: 01/13/2014 02:57   Mr Maxine Glenn Head/brain Wo Cm  01/13/2014   CLINICAL DATA:  Two episodes of LEFT-sided numbness today, gradually resolved. Possible slurred speech. Known RIGHT carotid artery stenosis.  EXAM: MRI HEAD WITHOUT CONTRAST  MRA HEAD WITHOUT CONTRAST  TECHNIQUE: Multiplanar, multiecho pulse sequences of the brain and surrounding structures were obtained without intravenous contrast. Angiographic images of the head were obtained using MRA technique without contrast.  COMPARISON:  CT of the head January 12, 2014 at 1641 hr and MRI of the head Jun 02, 2012  FINDINGS: MRI HEAD FINDINGS  The ventricles and sulci are normal for patient's age. No abnormal parenchymal signal, mass lesions, mass effect. No reduced diffusion to suggest acute ischemia. A few scattered subcentimeter foci of T2 hyperintense signal, less than expected for age. No susceptibility artifact to suggest hemorrhage.  No abnormal extra-axial fluid collections. No extra-axial masses though, contrast enhanced sequences would be more sensitive. Normal major intracranial vascular flow voids seen at the skull base.  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 No abnormal sellar expansion. Visualized paranasal sinuses and mastoid air cells are well-aerated. No suspicious calvarial bone  marrow signal. No abnormal sellar expansion. Craniocervical junction maintained.  MRA HEAD FINDINGS  Anterior circulation: Normal flow related enhancement of the included cervical, petrous, cavernous and supra clinoid internal carotid arteries. Patent anterior communicating artery. Normal flow related enhancement of the anterior and middle cerebral arteries, including more distal segments. Supernumerary anterior cerebral artery arising from LEFT A1 2 junction. Very mild intracranial mid to distal vessel luminal irregularity.  No large vessel occlusion, hemodynamically significant stenosis, aneurysm.  Posterior circulation: LEFT vertebral artery is dominant. Basilar artery is patent, with normal flow related enhancement of the main branch vessels. Normal flow related enhancement of the posterior cerebral arteries. RIGHT posterior communicating artery flow void present.  No large vessel occlusion, hemodynamically significant stenosis, aneurysm.  IMPRESSION: MRI HEAD: No acute intracranial process, specifically no acute ischemia. Normal noncontrast MRI of the brain for age.  MRA HEAD: No acute vascular process. Mild luminal irregularity can  be seen with intracranial atherosclerosis without stenosis.   Electronically Signed   By: Awilda Metro   On: 01/13/2014 02:57    Microbiology: Recent Results (from the past 240 hour(s))  MRSA PCR Screening     Status: None   Collection Time: 01/16/14  2:22 PM  Result Value Ref Range Status   MRSA by PCR NEGATIVE NEGATIVE Final    Comment:        The GeneXpert MRSA Assay (FDA approved for NASAL specimens only), is one component of a comprehensive MRSA colonization surveillance program. It is not intended to diagnose MRSA infection nor to guide or monitor treatment for MRSA infections.      Labs: Basic Metabolic Panel:  Recent Labs Lab 01/12/14 1625 01/14/14 0528 01/15/14 0330 01/17/14 0308  NA 133* 135 136 137  K 4.1 4.3 4.7 4.2  CL 99 99 101 103   CO2 24 29 30 28   GLUCOSE 286* 136* 120* 180*  BUN 21 16 15 16   CREATININE 1.01 1.04 1.16 1.13  CALCIUM 8.9 8.6 8.7 8.0*   Liver Function Tests:  Recent Labs Lab 01/12/14 1625  AST 23  ALT 18  ALKPHOS 81  BILITOT 0.5  PROT 7.0  ALBUMIN 3.8   CBC:  Recent Labs Lab 01/12/14 1625 01/14/14 0528 01/15/14 0330 01/17/14 0308  WBC 6.9 6.9 6.4 7.0  NEUTROABS 4.1  --   --   --   HGB 14.3 14.9 14.1 12.7*  HCT 40.1 42.6 39.6 36.9*  MCV 86.6 89.1 86.7 87.9  PLT 213 202 180 171   Cardiac Enzymes:  Recent Labs Lab 01/12/14 1625  TROPONINI <0.03   CBG:  Recent Labs Lab 01/16/14 0921 01/16/14 1634 01/16/14 2018 01/17/14 0729 01/17/14 1259  GLUCAP 191* 190* 127* 234* 285*   Signed:  GHERGHE, COSTIN  [facility] 01/17/2014, 7:02 PM

## 2014-01-17 NOTE — Progress Notes (Signed)
Patient Name: [name] Date of Encounter: 01/17/2014     Principal Problem:   TIA (transient ischemic attack) Active Problems:   Aortic stenosis, moderate   Carotid stenosis, right   DM type 1 with diabetic peripheral neuropathy   Essential hypertension   Stroke   Carotid stenosis   Carotid occlusion, right    SUBJECTIVE  No chest pain or shortness of breath.  He was a little dizzy during the night when he stood up.  Blood pressure low during the night but has improved this morning.  He is not on any blood pressure medication.He has not had any TIA symptoms post procedure.  CURRENT MEDS . aspirin EC  325 mg Oral Daily  . clopidogrel  75 mg Oral Daily  . insulin pump   Subcutaneous TID AC, HS, 0200  . levothyroxine  137 mcg Oral QAC breakfast  . pantoprazole  40 mg Oral Daily    OBJECTIVE  Filed Vitals:   01/17/14 0016 01/17/14 0443 01/17/14 0700 01/17/14 0827  BP: 107/45 110/45 84/52 129/49  Pulse: 67 72 72   Temp: 97.5 F (36.4 C) 98 F (36.7 C) 98.3 F (36.8 C)   TempSrc: Oral Oral Oral   Resp: Height:      Weight:      SpO2: 93% 96% 94%     Intake/Output Summary (Last 24 hours) at 01/17/14 1025 Last data filed at 01/17/14 0700  Gross per 24 hour  Intake    600 ml  Output   1025 ml  Net   -425 ml   Filed Weights   01/14/14 0500 01/16/14 1420  Weight: 213 lb (96.616 kg) 213 lb (96.616 kg)    PHYSICAL EXAM  General: Pleasant, NAD. Neuro: Alert and oriented X 3. Moves all extremities spontaneously. Psych: Normal affect. HEENT:  Normal  Neck: Supple without bruits or JVD. Lungs:  Resp regular and unlabored, CTA. Heart: RRR no s3, s4, or murmurs. Abdomen: Soft, non-tender, non-distended, BS + x 4.  groin reveals no ecchymosis.  No bruit.   Extremities: No clubbing, cyanosis or edema. DP/PT/Radials 2+ and equal bilaterally.  Accessory Clinical Findings  CBC  Recent Labs  01/15/14 0330 01/17/14 0308  WBC 6.4 7.0  HGB 14.1  12.7*  HCT 39.6 36.9*  MCV 86.7 87.9  PLT 180 171   Basic Metabolic Panel  Recent Labs  01/15/14 0330 01/17/14 0308  NA 136 137  K 4.7 4.2  CL 101 103  CO2 30 28  GLUCOSE 120* 180*  BUN 15 16  CREATININE 1.16 1.13  CALCIUM 8.7 8.0*   Liver Function Tests No results for input(s): AST, ALT, ALKPHOS, BILITOT, PROT, ALBUMIN in the last 72 hours. No results for input(s): LIPASE, AMYLASE in the last 72 hours. Cardiac Enzymes No results for input(s): CKTOTAL, CKMB, CKMBINDEX, TROPONINI in the last 72 hours. BNP Invalid input(s): POCBNP D-Dimer No results for input(s): DDIMER in the last 72 hours. Hemoglobin A1C No results for input(s): HGBA1C in the last 72 hours. Fasting Lipid Panel No results for input(s): CHOL, HDL, LDLCALC, TRIG, CHOLHDL, LDLDIRECT in the last 72 hours. Thyroid Function Tests No results for input(s): TSH, T4TOTAL, T3FREE, THYROIDAB in the last 72 hours.  Invalid input(s): FREET3  TELE  Normal sinus rhythm   ECG    Radiology/Studies  Ct Head Wo Contrast  01/12/2014   CLINICAL DATA:  Numbness, slurred speech  EXAM: CT HEAD WITHOUT CONTRAST  TECHNIQUE: Contiguous axial images were  obtained from the base of the skull through the vertex without intravenous contrast.  COMPARISON:  MRI brain 06/02/2012  FINDINGS: There is no evidence of mass effect, midline shift or extra-axial fluid collections. There is no evidence of a space-occupying lesion or intracranial hemorrhage. There is no evidence of a cortical-based area of acute infarction.  The ventricles and sulci are appropriate for the patient's age. The basal cisterns are patent.  Visualized portions of the orbits are unremarkable. The visualized portions of the paranasal sinuses and mastoid air cells are unremarkable.  The osseous structures are unremarkable.  IMPRESSION: Normal CT of the brain without intravenous contrast.   Electronically Signed   By: Elige Ko   On: 01/12/2014 16:53   Ct Angio Neck  W/cm &/or Wo/cm  01/13/2014   CLINICAL DATA:  64 year old male with episodes of left side numbness and slurred speech. Right carotid stenosis. Initial encounter.  EXAM: CT ANGIOGRAPHY NECK  TECHNIQUE: Multidetector CT imaging of the neck was performed using the standard protocol during bolus administration of intravenous contrast. Multiplanar CT image reconstructions and MIPs were obtained to evaluate the vascular anatomy. Carotid stenosis measurements (when applicable) are obtained utilizing NASCET criteria, using the distal internal carotid diameter as the denominator.  CONTRAST:  50mL OMNIPAQUE IOHEXOL 350 MG/ML SOLN  COMPARISON:  Brain MRI and intracranial MRA from 0145 hr today.  FINDINGS: Negative lung apices. No superior mediastinal lymphadenopathy. Small surgical clips at the thyroid bed, minimal residual thyroid parenchyma. Larynx, pharynx, parapharyngeal spaces, retropharyngeal space, sublingual space, submandibular glands, and parotid glands are within normal limits. Stable orbits. Visualized paranasal sinuses and mastoids are clear. No acute osseous abnormality identified. No cervical lymphadenopathy.  VASCULAR FINDINGS:  Aortic arch: 3 vessel configuration. Minimal arch atherosclerosis and no great vessel origin stenosis.  Right carotid system: Negative right CCA proximal to the right carotid bifurcation. At the carotid bifurcation there is bulky mostly soft but partially calcified plaque posteriorly tracking into the right ICA origin and bulb. Subsequent high-grade stenosis (radiographic string sign series 404, image 90). However, the right ICA remains patent and is otherwise negative to the skullbase. There is extensive calcified right ICA siphon plaque.  Left carotid system: Negative left CCA proximal to the left carotid bifurcation. Mostly calcified plaque at the posterior left carotid bifurcation, left ICA origin and bulb. Subsequent left ICA stenosis of less than 50 % with respect to the distal  vessel. beyond the bulb, the cervical left ICA is negative. Extensive calcified plaque in the visible left ICA siphon.  Vertebral arteries:Calcified right and soft left subclavian artery atherosclerosis but no hemodynamically significant proximal subclavian stenosis. Normal right vertebral artery origin. Negative right vertebral artery to the basilar. Normal right PICA origin.  Minimal calcified plaque at the left vertebral artery origin, no stenosis. Negative left vertebral artery otherwise to the basilar. Dominant appearing left AICA. Negative visible basilar artery.  IMPRESSION: 1. Right ICA origin high-grade stenosis (radiographic string sign) related tube bulky mostly soft plaque in the posterior bifurcation and continuing to the right ICA bulb. 2. No left cervical carotid hemodynamically significant stenosis. Negative vertebral arteries. 3. Heavily calcified ICA siphons.   Electronically Signed   By: Augusto Gamble M.D.   On: 01/13/2014 19:44   Mr Brain Wo Contrast  01/13/2014   CLINICAL DATA:  Two episodes of LEFT-sided numbness today, gradually resolved. Possible slurred speech. Known RIGHT carotid artery stenosis.  EXAM: MRI HEAD WITHOUT CONTRAST  MRA HEAD WITHOUT CONTRAST  TECHNIQUE: Multiplanar, multiecho pulse sequences  of the brain and surrounding structures were obtained without intravenous contrast. Angiographic images of the head were obtained using MRA technique without contrast.  COMPARISON:  CT of the head January 12, 2014 at 1641 hr and MRI of the head Jun 02, 2012  FINDINGS: MRI HEAD FINDINGS  The ventricles and sulci are normal for patient's age. No abnormal parenchymal signal, mass lesions, mass effect. No reduced diffusion to suggest acute ischemia. A few scattered subcentimeter foci of T2 hyperintense signal, less than expected for age. No susceptibility artifact to suggest hemorrhage.  No abnormal extra-axial fluid collections. No extra-axial masses though, contrast enhanced sequences would  be more sensitive. Normal major intracranial vascular flow voids seen at the skull base.  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 No abnormal sellar expansion. Visualized paranasal sinuses and mastoid air cells are well-aerated. No suspicious calvarial bone marrow signal. No abnormal sellar expansion. Craniocervical junction maintained.  MRA HEAD FINDINGS  Anterior circulation: Normal flow related enhancement of the included cervical, petrous, cavernous and supra clinoid internal carotid arteries. Patent anterior communicating artery. Normal flow related enhancement of the anterior and middle cerebral arteries, including more distal segments. Supernumerary anterior cerebral artery arising from LEFT A1 2 junction. Very mild intracranial mid to distal vessel luminal irregularity.  No large vessel occlusion, hemodynamically significant stenosis, aneurysm.  Posterior circulation: LEFT vertebral artery is dominant. Basilar artery is patent, with normal flow related enhancement of the main branch vessels. Normal flow related enhancement of the posterior cerebral arteries. RIGHT posterior communicating artery flow void present.  No large vessel occlusion, hemodynamically significant stenosis, aneurysm.  IMPRESSION: MRI HEAD: No acute intracranial process, specifically no acute ischemia. Normal noncontrast MRI of the brain for age.  MRA HEAD: No acute vascular process. Mild luminal irregularity can be seen with intracranial atherosclerosis without stenosis.   Electronically Signed   By: Awilda Metro   On: 01/13/2014 02:57   Mr Maxine Glenn Head/brain Wo Cm  01/13/2014   CLINICAL DATA:  Two episodes of LEFT-sided numbness today, gradually resolved. Possible slurred speech. Known RIGHT carotid artery stenosis.  EXAM: MRI HEAD WITHOUT CONTRAST  MRA HEAD WITHOUT CONTRAST  TECHNIQUE: Multiplanar, multiecho pulse sequences of the brain and surrounding structures were obtained without  intravenous contrast. Angiographic images of the head were obtained using MRA technique without contrast.  COMPARISON:  CT of the head January 12, 2014 at 1641 hr and MRI of the head Jun 02, 2012  FINDINGS: MRI HEAD FINDINGS  The ventricles and sulci are normal for patient's age. No abnormal parenchymal signal, mass lesions, mass effect. No reduced diffusion to suggest acute ischemia. A few scattered subcentimeter foci of T2 hyperintense signal, less than expected for age. No susceptibility artifact to suggest hemorrhage.  No abnormal extra-axial fluid collections. No extra-axial masses though, contrast enhanced sequences would be more sensitive. Normal major intracranial vascular flow voids seen at the skull base.  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 No abnormal sellar expansion. Visualized paranasal sinuses and mastoid air cells are well-aerated. No suspicious calvarial bone marrow signal. No abnormal sellar expansion. Craniocervical junction maintained.  MRA HEAD FINDINGS  Anterior circulation: Normal flow related enhancement of the included cervical, petrous, cavernous and supra clinoid internal carotid arteries. Patent anterior communicating artery. Normal flow related enhancement of the anterior and middle cerebral arteries, including more distal segments. Supernumerary anterior cerebral artery arising from LEFT A1 2 junction. Very mild intracranial mid to distal vessel  luminal irregularity.  No large vessel occlusion, hemodynamically significant stenosis, aneurysm.  Posterior circulation: LEFT vertebral artery is dominant. Basilar artery is patent, with normal flow related enhancement of the main branch vessels. Normal flow related enhancement of the posterior cerebral arteries. RIGHT posterior communicating artery flow void present.  No large vessel occlusion, hemodynamically significant stenosis, aneurysm.  IMPRESSION: MRI HEAD: No acute intracranial process,  specifically no acute ischemia. Normal noncontrast MRI of the brain for age.  MRA HEAD: No acute vascular process. Mild luminal irregularity can be seen with intracranial atherosclerosis without stenosis.   Electronically Signed   By: Awilda Metroourtnay  Bloomer   On: 01/13/2014 02:57    ASSESSMENT AND PLAN 1.  Status post stenting of tight right internal carotid stenosis, doing well. 2.  Blood pressure soft during the night, improving now.  Plan: Should be able to be discharged today [facility] carotid Dopplers and subsequent visit with Dr. Allyson SabalBerry. Home on aspirin and Plavix. Signed, Cassell Clementhomas Neo Yepiz MD

## 2014-01-17 NOTE — Discharge Instructions (Signed)
Follow with MCGOWEN,PHILIP H, MD in 5-7 days  Please get a complete blood count and chemistry panel checked by your Primary MD at your next visit, and again as instructed by your Primary MD. Please get your medications reviewed and adjusted by your Primary MD.  Please request your Primary MD to go over all Hospital Tests and Procedure/Radiological results at the follow up, please get all Hospital records sent to your Prim MD by signing hospital release before you go home.  If you had Pneumonia of Lung problems at the Hospital: Please get a 2 view Chest X ray done in 6-8 weeks after hospital discharge or sooner if instructed by your Primary MD.  If you have Congestive Heart Failure: Please call your Cardiologist or Primary MD anytime you have any of the following symptoms:  1) 3 pound weight gain in 24 hours or 5 pounds in 1 week  2) shortness of breath, with or without a dry hacking cough  3) swelling in the hands, feet or stomach  4) if you have to sleep on extra pillows at night in order to breathe  Follow cardiac low salt diet and 1.5 lit/day fluid restriction.  If you have diabetes Accuchecks 4 times/day, Once in AM empty stomach and then before each meal. Log in all results and show them to your primary doctor at your next visit. If any glucose reading is under 80 or above 300 call your primary MD immediately.  If you have Seizure/Convulsions/Epilepsy: Please do not drive, operate heavy machinery, participate in activities at heights or participate in high speed sports until you have seen by Primary MD or a Neurologist and advised to do so again.  If you had Gastrointestinal Bleeding: Please ask your Primary MD to check a complete blood count within one week of discharge or at your next visit. Your endoscopic/colonoscopic biopsies that are pending at the time of discharge, will also need to followed by your Primary MD.  Get Medicines reviewed and adjusted. Please take all your  medications with you for your next visit with your Primary MD  Please request your Primary MD to go over all hospital tests and procedure/radiological results at the follow up, please ask your Primary MD to get all Hospital records sent to his/her office.  If you experience worsening of your admission symptoms, develop shortness of breath, life threatening emergency, suicidal or homicidal thoughts you must seek medical attention immediately by calling 911 or calling your MD immediately  if symptoms less severe.  You must read complete instructions/literature along with all the possible adverse reactions/side effects for all the Medicines you take and that have been prescribed to you. Take any new Medicines after you have completely understood and accpet all the possible adverse reactions/side effects.   Do not drive or operate heavy machinery when taking Pain medications.   Do not take more than prescribed Pain, Sleep and Anxiety Medications  Special Instructions: If you have smoked or chewed Tobacco  in the last 2 yrs please stop smoking, stop any regular Alcohol  and or any Recreational drug use.  Wear Seat belts while driving.  Please note You were cared for by a hospitalist during your hospital stay. If you have any questions about your discharge medications or the care you received while you were in the hospital after you are discharged, you can call the unit and asked to speak with the hospitalist on call if the hospitalist that took care of you is not available. Once  you are discharged, your primary care physician will handle any further medical issues. Please note that NO REFILLS for any discharge medications will be authorized once you are discharged, as it is imperative that you return to your primary care physician (or establish a relationship with a primary care physician if you do not have one) for your aftercare needs so that they can reassess your need for medications and monitor your  lab values.  You can reach the hospitalist office at phone [phone] or fax [phone]   If you do not have a primary care physician, you can call [phone] for a physician referral.  Activity: As tolerated with Full fall precautions use walker/cane & assistance as needed  Diet: diabetic  Disposition Home

## 2014-01-20 ENCOUNTER — Telehealth: Payer: Self-pay | Admitting: *Deleted

## 2014-01-20 ENCOUNTER — Telehealth: Payer: Self-pay | Admitting: Cardiovascular Disease

## 2014-01-20 DIAGNOSIS — I6529 Occlusion and stenosis of unspecified carotid artery: Secondary | ICD-10-CM

## 2014-01-20 DIAGNOSIS — I350 Nonrheumatic aortic (valve) stenosis: Secondary | ICD-10-CM

## 2014-01-20 LAB — GLUCOSE, CAPILLARY: Glucose-Capillary: 279 mg/dL — ABNORMAL HIGH (ref 70–99)

## 2014-01-20 NOTE — Telephone Encounter (Signed)
-----   Message from Jonathan J Berry, MD sent at 01/08/2014  5:07 PM EST ----- Mild worsening in aortic stenosis with valve area but decreased from 1.17 cm 2.93 cm squared. There is normal LV function. Repeat in one year 

## 2014-01-20 NOTE — Telephone Encounter (Signed)
Pt called in stating that he had some stents placed on 1/10 and was told to follow up with Dr. Allyson SabalBerry this week. He also stated that he had some FMLA paperwork that needed to be signed by Dr. Allyson SabalBerry so that he may return to work on 1/25.   *Dr. Allyson SabalBerry is not in the office at all this week, is there someone else that can fullfill these duties??  Please call Thanks

## 2014-01-20 NOTE — Telephone Encounter (Signed)
Returned call to patient Dr.Berry out of office this week will send message to Dr.Berry's nurse.

## 2014-01-20 NOTE — Telephone Encounter (Signed)
I spoke with patient.  I explained the process for FMLA papers.  He will come by the office with the papers and his money and speak with medical records.    The carotid stent note mentioned a follow up carotid doppler and a follow up office visit.  I will place the order for the doppler and send a message to a scheduler for follow up.  Patient voiced understanding.

## 2014-01-20 NOTE — Telephone Encounter (Signed)
Patient notified of results  Order placed for repeat echo in 1 year.

## 2014-01-21 ENCOUNTER — Telehealth: Payer: Self-pay | Admitting: Cardiovascular Disease

## 2014-01-21 NOTE — Consult Note (Signed)
NAME:  [name], [name]              ACCOUNT NO.:  [id number]  MEDICAL RECORD NO.:  [id number]  LOCATION:  [id number]                        FACILITY:  [facility]  PHYSICIAN:  Larsen Dungan K. Brandee Markin, M.D.DATE OF BIRTH:  [birth year]  DATE OF CONSULTATION: DATE OF DISCHARGE:  01/17/2014                                CONSULTATION   CLINICAL HISTORY:  Right carotid stenosis.  EXAMINATION:    Intracranial interpretation of right common carotid arteriogram pre and post stent placement of the right internal carotid artery proximally.  FINDINGS:     The pre-stent placement right common carotid arteriogram demonstrates normal opacification of the right internal carotid artery in the features of the cavernous and the supraclinoid segments.  The right middle cerebral artery is seen to opacify into the capillary and venous phases.  Relatively poor filling is seen of the right anterior cerebral artery into the A1 segment and distally into the A2 segment with suggestion of cross filling from the contralateral left internal carotid artery.  The post stent placement right common carotid arteriogram shows  Significantly improved hemodynamic flow throughout the right internal carotid artery, petrous, cavernous, and supraclinoid segments.  There is robust filling of the right middle and the right anterior cerebral arteries into the capillary and venous phases.  No abnormal intraluminal filling defects or narrowing or lesions are seen.  IMPRESSION:    Significantly improved caliber  and hemodynamic flow into the right anterior circulation .         ______________________________ Grandville SilosSanjeev K. Corliss Skainseveshwar, M.D.     SKD/MEDQ  D:  01/21/2014  T:  01/21/2014  Job:  [id number]

## 2014-01-21 NOTE — Telephone Encounter (Signed)
Patient brought FMLA paperwork by for himself and his wife for Dr Allyson SabalBerry to complete and sign.  He completed release and decided to take forms, release and payment [facility]  Advised [facility] that he was bringing forms by their office

## 2014-01-21 NOTE — Telephone Encounter (Signed)
Closed encounter °

## 2014-01-22 ENCOUNTER — Encounter: Payer: Self-pay | Admitting: Family Medicine

## 2014-01-28 ENCOUNTER — Telehealth (HOSPITAL_COMMUNITY): Payer: Self-pay | Admitting: *Deleted

## 2014-01-29 NOTE — Telephone Encounter (Signed)
Forms completed and signed by Dr Allyson SabalBerry.  [place].

## 2014-01-30 ENCOUNTER — Ambulatory Visit (HOSPITAL_COMMUNITY)
Admission: RE | Admit: 2014-01-30 | Discharge: 2014-01-30 | Disposition: A | Discharge status: Home / Self Care | Payer: PRIVATE HEALTH INSURANCE | Source: Ambulatory Visit | Attending: Cardiovascular Disease | Admitting: Cardiovascular Disease

## 2014-01-30 DIAGNOSIS — I6523 Occlusion and stenosis of bilateral carotid arteries: Secondary | ICD-10-CM | POA: Insufficient documentation

## 2014-01-30 DIAGNOSIS — I6529 Occlusion and stenosis of unspecified carotid artery: Secondary | ICD-10-CM

## 2014-01-30 NOTE — Telephone Encounter (Signed)
Received signed forms back from Dr Allyson SabalBerry.  Contacted patient to let him know paperwork was completed and available for pick up

## 2014-01-30 NOTE — Progress Notes (Signed)
Carotid Duplex Completed. °Brianna L Mazza,RVT °

## 2014-01-31 ENCOUNTER — Telehealth: Payer: Self-pay | Admitting: Cardiovascular Disease

## 2014-01-31 NOTE — Telephone Encounter (Signed)
FMLA forms for [name] [facility] and FMLA forms for spouse Mrs [name] were faxed to [employer].  Per patient request a copy of procedure by Dr Allyson SabalBerry [facility]. lp

## 2014-02-05 ENCOUNTER — Encounter: Payer: Self-pay | Admitting: Cardiology

## 2014-02-05 ENCOUNTER — Ambulatory Visit (INDEPENDENT_AMBULATORY_CARE_PROVIDER_SITE_OTHER): Payer: PRIVATE HEALTH INSURANCE | Admitting: Cardiology

## 2014-02-05 VITALS — BP 134/76 | HR 82 | Ht 74.0 in | Wt 219.4 lb

## 2014-02-05 DIAGNOSIS — I739 Peripheral vascular disease, unspecified: Secondary | ICD-10-CM | POA: Diagnosis not present

## 2014-02-05 DIAGNOSIS — I639 Cerebral infarction, unspecified: Secondary | ICD-10-CM

## 2014-02-05 NOTE — Progress Notes (Signed)
02/06/2014 [name]   02/08/1950  [id number]  Primary Physician Jeoffrey MassedMCGOWEN,PHILIP H, MD Primary Cardiologist: Dr. Allyson SabalBerry  Reason for Visit/ CC: Follow-up for Carotid Artery Disease   HPI: [name] presents to clinic today for post hospital f/u. He was admitted for TIA subsequent to severe carotid artery disease. He has a history of insulin-dependent diabetes on insulin pump, mild sleep apnea, and hyperlipidemia as well as mild aortic stenosis. He had a Myoview stress test that was abnormal which led to a heart catheterization performed by Dr. Tresa EndoKelly in my absence revealing 70% disease in all 3 vessels with preserved LV function and mild AS. He also had moderate right ICA stenosis. He was neurologically asymptomatic. He was placed on beta-blocker and Ranexa Carotid Dopplers showed stable moderate right ICA stenosis.   He was [facility]/14 with 2 sequential TIAs. Carotid Dopplers did show moderate right ICA stenosis. The patient was seen by Dr. Pearlean BrownieSethi, stroke neurologist, and Dr. Imogene Burnhen, vascular surgeon for consultation. Dr. Imogene Burnhen ordered a CTA which showed high-grade ostial right internal carotid artery stenosis. After consideration between carotid endarterectomy and carotid stenting, the decision was made to proceed with stenting. This was performed by Dr. Allyson SabalBerry. Angiography revealed an 80% focal floroscopically calcified ostial right internal carotid artery stenosis. This was successfully treated with PTA and stenting using distal protection. He tolerated the procedure well. He was placed on DAPT with aspirin plus Plavix. Follow-up carotid Dopplers revealed signficant improvement with 1-49% diameter reduction in the right ICA.  Today in follow-up, he reports that he has done very well since discharge. He denies any recurrent signs/symptoms of TIA or stroke. No syncope/near-syncope. His femoral cath access site is well-healed without complications. He reports full medication  compliance with his medications. We also reviewed his labs obtained during his hospitalization. His diabetes has been poorly controlled. Hemoglobin A1c was 9.5. He states he has an insulin pump and this is managed by his PCP. Cholesterol was also severely elevated. LDL was calculated at 211. He reports severe intolerance to statins in the past, mainly noting severe memory impairment. He refuses to go back on statin therapy, despite his severe vascular disease.     Current Outpatient Prescriptions  Medication Sig Dispense Refill  . aspirin 325 MG EC tablet Take 1 tablet (325 mg total) by mouth daily. 30 tablet 1  . clopidogrel (PLAVIX) 75 MG tablet Take 1 tablet (75 mg total) by mouth daily. 30 tablet 1  . clotrimazole-betamethasone (LOTRISONE) cream Apply to affected area twice daily as needed for rash (Patient taking differently: Apply 1 application topically 2 (two) times daily as needed (rash). ) 15 g 2  . Feverfew 380 MG CAPS Take 380 mg by mouth daily. For migraine headaches    . Insulin Human (INSULIN PUMP) SOLN Inject into the skin continuous. Basal rate .95/hr, current pump settings: 12am .45, 2am .65, 7am .95, 11am .95, 5pm .75, 11pm .5    . levothyroxine (SYNTHROID, LEVOTHROID) 137 MCG tablet Take 1 tablet (137 mcg total) by mouth daily. 90 tablet 0  . NOVOLOG 100 UNIT/ML injection PUMP PARAMETERS: BASAL RATE 0.8 U/H, MEAL: 1:11, 1:12, 1:15 BF, L, D, 1 U/75 ABOVE 130 60 mL 1  . Omega-3 Fatty Acids (FISH OIL) 1000 MG CAPS Take 1,000 mg by mouth daily.    . ranitidine (ZANTAC) 150 MG tablet Take 150 mg by mouth daily as needed for heartburn.    . tadalafil (CIALIS) 20 MG tablet Take 20  mg by mouth daily as needed for erectile dysfunction.      No current facility-administered medications for this visit.    Allergies  Allergen Reactions  . Statins Other (See Comments)    Problem with higher dosages (Lipitor caused memory issues), also caused leg pains and lethargy  . Tape Rash     Adhesive tape.  (Paper tape OK)    History   Social History  . Marital Status: Married    Spouse Name: N/A    Number of Children: N/A  . Years of Education: N/A   Occupational History  . Not on file.   Social History Main Topics  . Smoking status: Former Games developer  . Smokeless tobacco: Former Neurosurgeon    Quit date: 06/22/1987  . Alcohol Use: Yes  . Drug Use: No  . Sexual Activity: Not on file   Other Topics Concern  . Not on file   Social History Narrative   Divorced x1, remarried.  Has 3 daughters from first marriage.   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   Distant tobacco abuse (35 pack-yr hx), quit in the late 1990s, no ETOH or drugs.   No exercise.    Family History  Problem Relation Age of Onset  . Cancer Mother     lung cancer  . Alcohol abuse Father   . Cancer Father     laryngeal cancer  . Cancer Brother     oldest brother had lung cancer and melanoma     Review of Systems: General: negative for chills, fever, night sweats or weight changes.  Cardiovascular: negative for chest pain, dyspnea on exertion, edema, orthopnea, palpitations, paroxysmal nocturnal dyspnea or shortness of breath Dermatological: negative for rash Respiratory: negative for cough or wheezing Urologic: negative for hematuria Abdominal: negative for nausea, vomiting, diarrhea, bright red blood per rectum, melena, or hematemesis Neurologic: negative for visual changes, syncope, or dizziness All other systems reviewed and are otherwise negative except as noted above.    Blood pressure 134/76, pulse 82, height  (1.88 m), weight 219 lb 6.4 oz (99.519 kg).  General appearance: alert, cooperative and no distress Neck: no carotid bruit and no JVD Lungs: clear to auscultation bilaterally Heart: regular rate and rhythm, S1, S2 normal, 2/6 systolic murmur, no click, rub or  gallop Extremities: no LEE Pulses: 2+ and symmetric Skin: warm and dry Neurologic: Grossly normal  EKG Not performed.   ASSESSMENT AND PLAN:   1. Right carotid artery disease: s/p successful right internal carotid artery PTA and stenting using distal protection for an 80+ percent ostial lesion. Repeat Dopplers showed significant improvement with 1-49% diameter reduction of the right ICA. He has been asymptomatic since undergoing the procedure without any recurrent signs/ symptoms of TIA or stroke. Continue dual antiplatelet therapy with aspirin plus Plavix. Repeat carotid Dopplers in 6 months.  2. HLD: We discussed the importance of better control of his lipids given his severe vascular disease and history of TIA and CAD. LDL was 211 at most recent assessment. He refuses to restart statin therapy due to severe intolerances in the past, mainly with memory impairment with these agents. I have discussed this in detail with Dr. [name]. He has recommended consultation with our office pharmacist to discuss possibility of starting one of the new PCSK9 inhibitors for LDL reduction.The patient is open to this idea. Will schedule an appointment  in one to 2 weeks.   3. CAD: stable w/o recurrent CP. Continue medical therapy and risk factor modification.   PLAN   Continue medical therapy. Referral for consultation with office pharmacist to discuss PCSK9 inhibitors has been placed. Repeat carotid Dopplers n 6 months. Follow-up with Dr. [name] in 6 months.  Dineen Kid 02/06/2014 6:48 PM

## 2014-02-05 NOTE — Patient Instructions (Addendum)
Your physician wants you to follow-up in: 6 months with Dr.Berry. You will receive a reminder letter in the mail two months in advance. If you don't receive a letter, please call our office to schedule the follow-up appointment.  Your physician has requested that you have a carotid duplex. This test is an ultrasound of the carotid arteries in your neck. It looks at blood flow through these arteries that supply the brain with blood. Allow one hour for this exam. There are no restrictions or special instructions. This will be in 6 months BEFORE you come back for your visit with Dr.Berry.    You also need to schedule a 30min with Joya GaskinsKristin Avlstad PH-D for PCSK9

## 2014-02-06 ENCOUNTER — Telehealth: Payer: Self-pay | Admitting: Cardiovascular Disease

## 2014-02-06 NOTE — Telephone Encounter (Signed)
[facility].  As I understand it, this is for caller's FMLA paperwork to approve absences and provide care for the patient. She can be called for clarification on anything.  She needs the leave to be INTERMITTENT (not continuous) and anything filled out in Section 4 for Continuous leave applied to Intermittent leave instead.  Also requesting  Leave for 1 yr (to exp 01/14/2015) and for absence as needed for taking pt to appts and giving care. Patient's wife spoke w/ Calla KicksLynn Klett on the phone, was advised that any changes which needed to be made would have to be done by Dr. Hazle CocaBerry's nurse.  Will route to [place] to handle.

## 2014-02-06 NOTE — Telephone Encounter (Signed)
Please call, she needs to discuss FMLA papers.

## 2014-02-11 ENCOUNTER — Ambulatory Visit (INDEPENDENT_AMBULATORY_CARE_PROVIDER_SITE_OTHER): Payer: PRIVATE HEALTH INSURANCE | Admitting: Internal Medicine

## 2014-02-11 ENCOUNTER — Encounter: Payer: Self-pay | Admitting: Internal Medicine

## 2014-02-11 VITALS — BP 110/62 | HR 79 | Temp 97.4°F | Resp 12 | Wt 219.8 lb

## 2014-02-11 DIAGNOSIS — I639 Cerebral infarction, unspecified: Secondary | ICD-10-CM

## 2014-02-11 DIAGNOSIS — G990 Autonomic neuropathy in diseases classified elsewhere: Secondary | ICD-10-CM

## 2014-02-11 DIAGNOSIS — E1042 Type 1 diabetes mellitus with diabetic polyneuropathy: Secondary | ICD-10-CM

## 2014-02-11 NOTE — Progress Notes (Signed)
Patient ID: [name], male   DOB: 05/28/1950, 64 y.o.   MRN: [id number]  HPI: [name] is a 64 y.o.-year-old male, returning for f/u for DM1, dx 221964 71(64 y/o), uncontrolled, with complications (CAD, proliferative diabetic retinopathy-history of retinal detachment OD, right carotid stenosis, PVD, peripheral neuropathy, ED). Last visit 3 mo ago.  He had a TIA in 01/2014. No neurologic deficits.   Last hemoglobin A1c was: Lab Results  Component Value Date   HGBA1C 9.5* 01/13/2014   HGBA1C 8.9* 11/12/2013   HGBA1C 9.6* 07/17/2013   Pt is on an insulin pump: One Touch Ping (Animas) with NovoLog. He got a replacement pump b/c errors in the old pump.   At last visit, we made several changes in his pump settings, however, he only change the basal rates and the rest of the parameters: Pump settings   - basal rate:  MN: 0.45 2 am: 0.65 7 am: 0.85 >> 0.95 11 am: 0.85 >> 0.95 5 PM: 0.60 >> 0.75 11pm 0.40 >> 0.50 - ICR:  12 am: 15 6 am: 15 instead of 12 10 am 14 >> 12 4 pm: 14 >> 13 instead of 12 - ISF:   MN: 30   11 am: 25  10 pm: 30 instead of 25 - target:  MN: 140 5:30 am: 120 >> 110  10 pm: 150 >> 140   - IOB: 4 h   Pt checks his sugars 3-4 a day and they are still high: - am: 1150-200 >> 98-160-261 >> 56-180, 236 >> 54, 87-265, 377 - 2h after b'fast: n/c >> 191-284 >> 190, 441 >> n/c - before lunch: 180-275 >>  200s >> 175-250 >> 125-230-291 >> 170-311 >> 119-228, 326 - 2h after lunch: 295 x 1 >> 65 x1, 281, 410 x1 >> 197-301 >> n/c - before dinner: 180-275 >> 200s >> 175-250, few 300s >> 73-170-251 >> 190-466 >> 58, 100-179 - bedtime: can go to 300s >> 300-400s >> 112-450 >> 84-315 >> 125-391 >> 161-388 - nighttime: 66 >> 56, 190-407 >> 275, 172 + few lows. Lowest sugar was 65, 66 >> 54 (after correction of a high); he has hypoglycemia awareness at 100. Highest: 400s, site problem  He has a new meter: [employer].  Pt's meals are: - Breakfast: 1.5 scramble eggs,  toast, milk - 65 g carbs - Lunch: sandwich, shaved chicken or [place]key breast, cheese, mayo, chips, soda - 100 carbs - Dinner: meat, vegetables, bread, fruit, milk - 100 g carbs - Snacks: 2-3 a day: bagel midmorning, evening snack: chips, popcorn  Pt does have mild chronic kidney disease, last BUN/creatinine was:  Lab Results  Component Value Date   BUN 16 01/17/2014   CREATININE 1.13 01/17/2014  Last microalbumin/creatinine ratio was 3.8.  Last set of lipids: Lab Results  Component Value Date   CHOL 298* 01/13/2014   HDL 66 01/13/2014   LDLCALC 211* 01/13/2014   LDLDIRECT 159.5 04/21/2011   TRIG 105 01/13/2014   CHOLHDL 4.5 01/13/2014  He is not on a statin, this is listed as an intolerance. Pt's last eye exam was in 12/2012. + DR - OD, s/p detached retina, but stable. Has numbness and tingling in his legs.  He also has a history of hyperlipidemia, disequilibrium, mild cognitive impairment, hypothyroidism-status post thyroidectomy for multinodular goiter. He also has OSA-on CPAP, GERD.  I reviewed pt's medications, allergies, PMH, social hx, family hx, and changes were documented in the history of present illness. Otherwise, unchanged from my initial  visit note. ROS: Constitutional: no weight gain, no fatigue, no subjective hyperthermia/hypothermia Eyes: + blurry vision, no xerophthalmia ENT: no sore throat, no nodules palpated in throat, no dysphagia/odynophagia, no hoarseness Cardiovascular: no CP/SOB/palpitations/ leg swelling Respiratory: no cough/SOB Gastrointestinal: no N/V/D/C Musculoskeletal: no muscle aches/joint aches Skin: no rashes Neurological: no tremors/numbness/tingling/dizziness  PE: BP 110/62 mmHg  Pulse 79  Temp(Src) 97.4 F (36.3 C) (Oral)  Resp 12  Wt 219 lb 12.8 oz (99.701 kg)  SpO2 95% Wt Readings from Last 3 Encounters:  02/11/14 219 lb 12.8 oz (99.701 kg)  02/05/14 219 lb 6.4 oz (99.519 kg)  01/16/14 213 lb (96.616 kg)  Body mass index is  28.21 kg/(m^2).  Constitutional: slightly overweight, in NAD Eyes: PERRLA, EOMI, no exophthalmos ENT: moist mucous membranes, no thyromegaly, no cervical lymphadenopathy Cardiovascular: RRR, + 2/6 SEM, no RG Respiratory: CTA B Gastrointestinal: abdomen soft, NT, ND, BS+ Musculoskeletal: no deformities, strength intact in all 4 Skin: moist, warm, no rashes  ASSESSMENT: 1. 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  Patient with long-standing type 1 diabetes, poorly controlled, with multiple complications, on an insulin pump, however without good understanding of the pump principles and settings. Since last visit, he did not make all of the changes that I suggested in his insulin pump settings. He is not sure why he did not do them. He was also working with Cristy Folks (DM education), not recently. I advised him to return to see her. His sugars are high in the morning, before lunch and after dinner >> will suggest to decrease insulin to carb ratio with breakfast and with dinner, and also decrease his sensitivity factor and CBG target after 10 PM. - I advised him to change the following; Patient Instructions  Please schedule an appt with Cristy Folks.  Please change the pump settings as follows: - basal rate:  MN: 0.45 2 am: 0.65 7 am: 0.95 11 am: 0.95 5 PM: 0.75 11pm 0.50 - ICR:  12 am: 15 6 am: 15 >> 12 10 am 12 4 pm: 13 >> 10 - ISF:   MN: 30   11 am: 25  10 pm: 30 >> 25 - target:  MN: 140 5:30 am: 110  10 pm: 140 >> 120   - IOB: 4 h   Please return in 1.5 months. - refused flu vaccine  - I will see him in 3 mo - will check a hemoglobin A1c today  - time spent with the patient: 40 min, of which >50%  was spent in reviewing his pump and meter downloads, discussing his hypo- and hyper-glycemic episodes, reviewing his previous labs and pump settings and developing a plan to avoid hypo- an hyper-glycemia.

## 2014-02-11 NOTE — Patient Instructions (Signed)
Please schedule an appt with Nathaniel FolksLinda Carter.  Please change the pump settings as follows: - basal rate:  MN: 0.45 2 am: 0.65 7 am: 0.95 11 am: 0.95 5 PM: 0.75 11pm 0.50 - ICR:  12 am: 15 6 am: 15 >> 12 10 am 12 4 pm: 13 >> 10 - ISF:   MN: 30   11 am: 25  10 pm: 30 >> 25 - target:  MN: 140 5:30 am: 110  10 pm: 140 >> 120   - IOB: 4 h   Please return in 1.5 months.

## 2014-02-12 NOTE — Telephone Encounter (Signed)
Paperwork has been corrected and ready for pick up.  I left a message for Ms Doneta Publicarkes.

## 2014-02-13 NOTE — Telephone Encounter (Signed)
I spoke with Ms Doneta Publicarkes and I faxed papers at her request

## 2014-02-14 ENCOUNTER — Other Ambulatory Visit: Payer: Self-pay | Admitting: Family Medicine

## 2014-02-18 ENCOUNTER — Other Ambulatory Visit: Payer: Self-pay | Admitting: Family Medicine

## 2014-02-18 MED ORDER — LEVOTHYROXINE SODIUM 137 MCG PO TABS: 137.0000 ug | ORAL_TABLET | Freq: Every day | ORAL | Status: DC

## 2014-02-19 ENCOUNTER — Ambulatory Visit (INDEPENDENT_AMBULATORY_CARE_PROVIDER_SITE_OTHER): Payer: PRIVATE HEALTH INSURANCE | Admitting: Pharmacist Clinician (PhC)/ Clinical Pharmacy Specialist

## 2014-02-19 VITALS — Ht 74.0 in | Wt 219.8 lb

## 2014-02-19 DIAGNOSIS — E785 Hyperlipidemia, unspecified: Secondary | ICD-10-CM

## 2014-02-19 DIAGNOSIS — I639 Cerebral infarction, unspecified: Secondary | ICD-10-CM

## 2014-02-19 NOTE — Patient Instructions (Signed)
Start Zetia 5 mg (1/2 tablet) daily.  Try for 2 weeks.  Call in 7-10 days to let us know how you're tolerating this.  Consider taking Cholestoff Ashby Dawes(Nature Made) - 2 capsules daily

## 2014-02-20 ENCOUNTER — Other Ambulatory Visit: Payer: Self-pay | Admitting: Pharmacist Clinician (PhC)/ Clinical Pharmacy Specialist

## 2014-02-20 ENCOUNTER — Encounter: Payer: Self-pay | Admitting: Pharmacist Clinician (PhC)/ Clinical Pharmacy Specialist

## 2014-02-20 MED ORDER — CLOPIDOGREL BISULFATE 75 MG PO TABS: 75.0000 mg | ORAL_TABLET | Freq: Every day | ORAL | Status: DC

## 2014-02-20 NOTE — Progress Notes (Signed)
02/20/2014 [name] 08/13/1950 [id number]   HPI:  Nathaniel DemarkRichard Bolotin is a 64 y.o. male patient of Dr Allyson SabalBerry, who presents today for a lipid clinic evaluation.   RF:  DM1 (diagnosed in 1963), PVD, CVD, carotid stent, TIA  Meds:  Fish oil 1 gm daily   Intolerant: statins - memory issues and myalgias  Tried in the past: rosuvastatin (1/14); atorvastatin 1/14 to 5/14 (80 --> 20 mg); pravastatin 10mg  (6/15 to 1/16)   Family history: father had some heart disease, died from cancer, no other significant history  Diet: pt is DM type 1, on insulin pump, however poorly controlled, last A1c 9.5; pt eats fried egg and toast for breakfast daily, sandwiches for lunch.  Dinner protein is mostly chicken, however eats canned vegetables, breads regularly.  Doesn't have much desire to change diet.  Exercise: none    Labs:  Results for Nathaniel DemarkRKES, Navarro (MRN [id number]) as of 02/20/2014 13:01  Ref. Range 01/13/2014 06:23  Cholesterol Latest Range: 0-200 mg/dL 782298 (H)  Triglycerides Latest Range: <150 mg/dL 956105  HDL Latest Range: >39 mg/dL 66  LDL (calc) Latest Range: 0-99 mg/dL 213211 (H)  VLDL Latest Range: 0-40 mg/dL 21  Total CHOL/HDL Ratio Latest Units: RATIO 4.5     Current Outpatient Prescriptions  Medication Sig Dispense Refill  . aspirin 325 MG EC tablet Take 1 tablet (325 mg total) by mouth daily. 30 tablet 1  . clopidogrel (PLAVIX) 75 MG tablet Take 1 tablet (75 mg total) by mouth daily. 30 tablet 1  . clotrimazole-betamethasone (LOTRISONE) cream Apply to affected area twice daily as needed for rash (Patient taking differently: Apply 1 application topically 2 (two) times daily as needed (rash). ) 15 g 2  . Feverfew 380 MG CAPS Take 380 mg by mouth daily. For migraine headaches    . Insulin Human (INSULIN PUMP) SOLN Inject into the skin continuous. Basal rate .95/hr, current pump settings: 12am .45, 2am .65, 7am .95, 11am .95, 5pm .75, 11pm .5    . levothyroxine (SYNTHROID, LEVOTHROID) 137 MCG  tablet Take 1 tablet (137 mcg total) by mouth daily. 90 tablet 1  . NOVOLOG 100 UNIT/ML injection PUMP PARAMETERS: BASAL RATE 0.8 U/H, MEAL: 1:11, 1:12, 1:15 BF, L, D, 1 U/75 ABOVE 130 60 mL 1  . Omega-3 Fatty Acids (FISH OIL) 1000 MG CAPS Take 1,000 mg by mouth daily.    . ranitidine (ZANTAC) 150 MG tablet Take 150 mg by mouth daily as needed for heartburn.    . tadalafil (CIALIS) 20 MG tablet Take 20 mg by mouth daily as needed for erectile dysfunction.      No current facility-administered medications for this visit.    Allergies  Allergen Reactions  . Statins Other (See Comments)    Problem with higher dosages (Lipitor caused memory issues), also caused leg pains and lethargy  . Tape Rash    Adhesive tape.  (Paper tape OK)    Past Medical History  Diagnosis Date  . Diabetes mellitus Dx'd age 64    Novolog via insulin pump; sub-optimal control x years  . High cholesterol     Takes 1/2 of 80mg  atorvastatin daily  . Hypothyroidism     Thyroidectomy 2002; multinodular goiter  . Erectile dysfunction     Se[place]rologist in [place]  . Diabetic retinopathy     Proliferative: Hx of retinal detachment on right-light perception only in right eye  . History of tobacco abuse     Quit about 1990 (has 35 pack-yr  hx)  . OSA (obstructive sleep apnea)     06/2011 sleep study: moderate OSA, CPAP at 12 cm H2O.  . Impaired vision     right eye light perception only; hx of retinal detachment.  . Recurrent pneumonia   . GERD (gastroesophageal reflux disease)   . Venous insufficiency   . Diastolic dysfunction 2007    TEE with diastolic dysfunction, EF 74%.  . Diabetic neuropathy     fine touch and position sense affected  . Aortic stenosis, mild Sept/Oct /2013    Mild/mod by echo; mild by cath 10/28/11.  Mild progression by echo 05/2012--f/u echo annually recommended (patient asymptomatic).  . Carotid stenosis, right 09/2011    50-70% stenosis, stable on repeat dopplers 05/2012 but left sided  velocities increased 01/2013. Dr. [name] suggests f/u doppler in 6 mo.  . 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  Med mgmt recommended.  . Abnormal stress test 10/27/2011    lat isch--cardiac cath 10/28/2011  . Venous reflux 10/14/2011    venous doppler-R GSV continuous reflux throughout; too small for VNUS closure  . Decreased pedal pulses     LE doppler 11/08/11- no evidence of arterial insufficiency  . TIA (transient ischemic attack) 2015/16    with R carotid dz: pt got R carotid stent 01/2014 by Dr. [name] and was put on dual antiplatelet therapy with ASA  and plavix  qd afterwards    Height  (1.88 m), weight 219 lb 12.8 oz (99.701 kg).   [name] PharmD CPP [facility]

## 2014-02-20 NOTE — Assessment & Plan Note (Addendum)
Pt with LDL at 211, sequential TIAs in January with right internal carotid stent placed 1.14.16.  He has been shown to be statin intolerant in the past, having tired rosuvastatin, atorvastatin and pravastatin, all causing muscle and memory problems.  We discussed his diet, particularly on increasing his fiber intake and decreasing high carbohydrate foods.  Pt does not appear interested in altering his diet, as he worries how it will affect his insulin pump settings.  When I suggested that he find another breakfast item to replace the fried eggs and toast, he suggested that maybe he would eat a sausage biscuit occasionally instead of the egg.    Pt does not recall reaction he previously had to Zetia and is willing to try it again.  I recommended starting at 5 mg (1/2 tablet) daily for the first week or two and if he can tolerate, will increase to 10 mg daily.  I also suggested he try Cholestoff from [facility], which has plant stanols, but he didn't want to try two products, so he said he would consider it after the 2 weeks on Zetia.    We discussed the option of a PCSK-9 inhibitor, but he is somewhat resistant to that at this time.  If he can tolerate the 2 weeks on Zetia, will increase him to 10 mg daily and repeat labs in 10-12 weeks.  After that we could apply for PCSK-9 inhibitor (Praluent per his insurance).

## 2014-02-28 ENCOUNTER — Other Ambulatory Visit: Payer: Self-pay | Admitting: Pharmacist Clinician (PhC)/ Clinical Pharmacy Specialist

## 2014-02-28 LAB — HM DIABETES EYE EXAM

## 2014-02-28 MED ORDER — EZETIMIBE 10 MG PO TABS: 10.0000 mg | ORAL_TABLET | Freq: Every day | ORAL | Status: DC

## 2014-03-05 ENCOUNTER — Other Ambulatory Visit: Payer: Self-pay | Admitting: Family Medicine

## 2014-03-05 MED ORDER — CLOPIDOGREL BISULFATE 75 MG PO TABS: 75.0000 mg | ORAL_TABLET | Freq: Every day | ORAL | Status: DC

## 2014-03-05 NOTE — Telephone Encounter (Signed)
Rx sent per patient request  

## 2014-03-06 ENCOUNTER — Telehealth: Payer: Self-pay | Admitting: Pharmacist Clinician (PhC)/ Clinical Pharmacy Specialist

## 2014-03-06 NOTE — Telephone Encounter (Signed)
LMOM - VM, for patient, wondering how he is doing on Zetia.  Asked that he call back at his convenience

## 2014-03-06 NOTE — Telephone Encounter (Signed)
-----   Message from Phillips HayKristin Emmagene Ortner, RPH-CPP sent at 02/20/2014  1:15 PM EST ----- Call to see if tolerating zetia

## 2014-03-07 ENCOUNTER — Telehealth: Payer: Self-pay | Admitting: Pharmacist Clinician (PhC)/ Clinical Pharmacy Specialist

## 2014-03-07 MED ORDER — CLOPIDOGREL BISULFATE 75 MG PO TABS: 75.0000 mg | ORAL_TABLET | Freq: Every day | ORAL | Status: DC

## 2014-03-07 NOTE — Telephone Encounter (Signed)
Pt called to state doing well on Zetia, will increase to 10 mg daily.  Concerned about cost, refuses to get if too expensive

## 2014-03-26 ENCOUNTER — Encounter: Payer: Self-pay | Admitting: Internal Medicine

## 2014-03-26 ENCOUNTER — Ambulatory Visit (INDEPENDENT_AMBULATORY_CARE_PROVIDER_SITE_OTHER): Payer: PRIVATE HEALTH INSURANCE | Admitting: Internal Medicine

## 2014-03-26 VITALS — BP 122/68 | HR 89 | Temp 97.9°F | Resp 12 | Wt 219.0 lb

## 2014-03-26 DIAGNOSIS — E1042 Type 1 diabetes mellitus with diabetic polyneuropathy: Secondary | ICD-10-CM | POA: Diagnosis not present

## 2014-03-26 DIAGNOSIS — I639 Cerebral infarction, unspecified: Secondary | ICD-10-CM

## 2014-03-26 DIAGNOSIS — G990 Autonomic neuropathy in diseases classified elsewhere: Secondary | ICD-10-CM | POA: Diagnosis not present

## 2014-03-26 NOTE — Patient Instructions (Addendum)
  Please change the pump settings as follows: - basal rate:  MN: 0.45 >> 0.55 2 am: 0.65 >> 0.75 7 am: 0.95 >> 1.1 11 am: 0.95 >> 1.1 5 PM: 0.75 >> 0.85 11pm 0.50 >> 0.65 - ICR:  12 am: 15 6 am: 12 10 am 12 4 pm: 10 - ISF:   MN: 30   11 am: 25  10 pm: 25 - target:  MN: 140 5:30 am: 110  10 pm: 120    - IOB: 4 h

## 2014-03-26 NOTE — Progress Notes (Signed)
Patient ID: [name], male   DOB: 1950/01/27, 64 y.o.   MRN: [id number]  HPI: [name] is a 64 y.o.-year-old male, returning for f/u for DM1, dx 1964 (64 y/o), uncontrolled, with complications (CAD, proliferative diabetic retinopathy-history of retinal detachment OD, right carotid stenosis, cerebrovascular disease - s/p TIA 01/2014, PVD, peripheral neuropathy, ED). Last visit 1.5 mo ago.  Last hemoglobin A1c was: Lab Results  Component Value Date   HGBA1C 9.5* 01/13/2014   HGBA1C 8.9* 11/12/2013   HGBA1C 9.6* 07/17/2013   Pt is on an insulin pump: One Touch Ping ([employer]) with NovoLog. He got a replacement pump b/c errors in the old pump.   At last visit, we made several changes in his pump settings, however, he did not make all of th changes that I suggested. However, since last visit, he changed the settings, as advised. - basal rate:  MN: 0.45 2 am: 0.65 7 am: 0.95 11 am: 0.95 5 PM: 0.75 11pm 0.50 - ICR:  12 am: 15 6 am: 15 >> 12 10 am 12 4 pm: 13 >> 10 - ISF:   MN: 30   11 am: 25  10 pm: 30 >> 25 - target:  MN: 140 5:30 am: 110  10 pm: 140 >> 120  - IOB: 4 h  TDD basal 39% TDD bolus 61% TDD 45 units  Pt checks his sugars 3-4 a day and they are still high, but a little improved - ave 198. SD 83 (approximately 50% of sugars are at target): - am: 1150-200 >> 98-160-261 >> 56-180, 236 >> 54, 87-265, 377 >> 112-266, 300, 343 - 2h after b'fast: n/c >> 191-284 >> 190, 441 >> n/c >> 110-315 - before lunch: 180-275 >>  200s >> 175-250 >> 125-230-291 >> 170-311 >> 119-228, 326 >> 141-208 - 2h after lunch: 295 x 1 >> 65 x1, 281, 410 x1 >> 197-301 >> n/c >> 99-212, 409 - before dinner: 175-250, few 300s >> 73-170-251 >> 190-466 >> 58, 100-179 >> 57x1 (over correction), 102-214, 352 - bedtime: can go to 300s >> 300-400s >> 112-450 >> 84-315 >> 125-391 >> 161-388 >> 139-333 - nighttime: 66 >> 56, 190-407 >> 275, 172 >> 110, 193, 393 + few lows. Lowest sugar was 65, 66 >>  54 (after correction of a high); he has hypoglycemia awareness at 100. Highest: 400s, site problem  He has a new meter: [employer].  Pt's meals are: - Breakfast: 1.5 scramble eggs, toast, milk - 65 g carbs - Lunch: sandwich, shaved chicken or [place] breast, cheese, mayo, chips, soda - 100 carbs - Dinner: meat, vegetables, bread, fruit, milk - 100 g carbs - Snacks: 2-3 a day: bagel midmorning, evening snack: chips, popcorn  Pt does have mild chronic kidney disease, last BUN/creatinine was:  Lab Results  Component Value Date   BUN 16 01/17/2014   CREATININE 1.13 01/17/2014  Last microalbumin/creatinine ratio was 3.8.  Last set of lipids: Lab Results  Component Value Date   CHOL 298* 01/13/2014   HDL 66 01/13/2014   LDLCALC 211* 01/13/2014   LDLDIRECT 159.5 04/21/2011   TRIG 105 01/13/2014   CHOLHDL 4.5 01/13/2014  He is not on a statin, this is listed as an intolerance. Pt's last eye exam was in 02/28/2014 ([facility]). He has proliferative DR, s/p detached retina. He was advised to return in a year. Has numbness and tingling in his legs.  He also has a history of hyperlipidemia, disequilibrium, mild cognitive impairment, hypothyroidism-status post  thyroidectomy for multinodular goiter. He also has OSA-on CPAP, GERD.  I reviewed pt's medications, allergies, PMH, social hx, family hx, and changes were documented in the history of present illness. Otherwise, unchanged from my initial visit note. ROS: Constitutional: no weight gain, no fatigue, no subjective hyperthermia/hypothermia Eyes: no blurry vision, no xerophthalmia ENT: no sore throat, no nodules palpated in throat, no dysphagia/odynophagia, no hoarseness Cardiovascular: no CP/SOB/palpitations/ leg swelling Respiratory: no cough/SOB Gastrointestinal: no N/V/D/C Musculoskeletal: no muscle aches/joint aches Skin: no rashes Neurological: no tremors/numbness/tingling/dizziness  PE: BP 122/68 mmHg  Pulse 89   Temp(Src) 97.9 F (36.6 C) (Oral)  Resp 12  Wt 219 lb (99.338 kg)  SpO2 95% Wt Readings from Last 3 Encounters:  03/26/14 219 lb (99.338 kg)  02/19/14 219 lb 12.8 oz (99.701 kg)  02/11/14 219 lb 12.8 oz (99.701 kg)  Body mass index is 28.11 kg/(m^2).  Constitutional: slightly overweight, in NAD Eyes: PERRLA, EOMI, no exophthalmos ENT: moist mucous membranes, no thyromegaly, no cervical lymphadenopathy Cardiovascular: RRR, + 2/6 SEM, no RG Respiratory: CTA B Gastrointestinal: abdomen soft, NT, ND, BS+ Musculoskeletal: no deformities, strength intact in all 4 Skin: moist, warm, no rashes  ASSESSMENT: 1. 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  Patient with long-standing type 1 diabetes, poorly controlled, with multiple complications, on an insulin pump, however without good understanding of the pump principles and settings. Since last visit, he did start to make the changes that I suggested in his insulin pump settings and also worked with Cristy Folks (DM education).  There are still several problems in his diabetes management: - I believe that he basal rates are slightly too low, so that he is now getting only 39% of his total daily dose of insulin from basal rates and 61% from boluses. Subsequently, he has higher sugars in a.m. and throughout the day, necessitating bolusing between meals - a he tells me that [facility] times he has to much Insulin on Board so he has to eat something sweet during the day or before he goes to bed to avoid sugar drops. I believe that this is happening because he is bolusing whenever he checks his sugars, boluses a lot between  meals. For example, if he wakes up in the morning and his sugars are high, he will bolus for that, then checks again his sugars in an hour and boluses for breakfast >> he may get a low blood sugar later, or feels the need to eat something sweet. - he overcorrects his lows - We addressed all of the problems above and I advised him to: - Increase his insulin rates as mentioned below - Try to bolus 90% of times with meals and use the postprandials as this information for bolus adjustment - I advised him not to eat preemptively, but if he is worried that he may drop his sugars later on to check more frequently and address the lows as needed  - I advised him to try to stay with the recommended 15 g of carbs whenever he is low Patient Instructions  Please change the pump settings as follows: - basal rate:  MN: 0.45 >> 0.55 2 am: 0.65 >> 0.75 7 am: 0.95 >>  1.1 11 am: 0.95 >> 1.1 5 PM: 0.75 >> 0.85 11pm 0.50 >> 0.65 - ICR:  12 am: 15 6 am: 12 10 am 12 4 pm: 10 - ISF:   MN: 30   11 am: 25  10 pm: 25 - target:  MN: 140 5:30 am: 110  10 pm: 120    - IOB: 4 h  - I will see him in 1.5 mo - will check a hemoglobin A1c at next visit  - time spent with the patient: 40 min, of which >50% was spent in reviewing his pump and meter downloads, discussing his hypo- and hyper-glycemic episodes, reviewing his previous labs and pump settings and developing a plan to avoid hypo- an hyper-glycemia.  Please also see topics discussed above.

## 2014-03-27 ENCOUNTER — Other Ambulatory Visit: Payer: Self-pay | Admitting: *Deleted

## 2014-03-27 MED ORDER — INSULIN ASPART 100 UNIT/ML ~~LOC~~ SOLN: SUBCUTANEOUS | Status: DC

## 2014-03-31 ENCOUNTER — Ambulatory Visit: Payer: PRIVATE HEALTH INSURANCE | Admitting: Family Medicine

## 2014-04-09 ENCOUNTER — Encounter: Payer: Self-pay | Admitting: Internal Medicine

## 2014-04-09 ENCOUNTER — Encounter: Payer: Self-pay | Admitting: Family Medicine

## 2014-04-09 ENCOUNTER — Ambulatory Visit (INDEPENDENT_AMBULATORY_CARE_PROVIDER_SITE_OTHER): Payer: PRIVATE HEALTH INSURANCE | Admitting: Family Medicine

## 2014-04-09 VITALS — BP 120/80 | HR 79 | Temp 97.9°F | Ht 74.0 in | Wt 219.0 lb

## 2014-04-09 DIAGNOSIS — I6521 Occlusion and stenosis of right carotid artery: Secondary | ICD-10-CM | POA: Diagnosis not present

## 2014-04-09 DIAGNOSIS — E039 Hypothyroidism, unspecified: Secondary | ICD-10-CM

## 2014-04-09 DIAGNOSIS — Z79899 Other long term (current) drug therapy: Secondary | ICD-10-CM

## 2014-04-09 DIAGNOSIS — E785 Hyperlipidemia, unspecified: Secondary | ICD-10-CM | POA: Diagnosis not present

## 2014-04-09 DIAGNOSIS — Z1211 Encounter for screening for malignant neoplasm of colon: Secondary | ICD-10-CM

## 2014-04-09 LAB — TSH: TSH: 5.94 u[IU]/mL — AB (ref 0.35–4.50)

## 2014-04-09 NOTE — Progress Notes (Signed)
Pre visit review using our clinic review tool, if applicable. No additional management support is needed unless otherwise documented below in the visit note. 

## 2014-04-09 NOTE — Progress Notes (Signed)
OFFICE VISIT  04/09/2014   CC:  Chief Complaint  Patient presents with  . Follow-up   HPI:    Patient is a 64 y.o. Caucasian male who presents for 6 mo f/u chronic medical problems/polypharmacy:  Pt with CAD on medical mgmt, carotid artery dz with hx of TIA and is now s/p carotid stent 01/2014-supposed to be on ASA and plavix.  Has DM and hypothyroidism managed by Dr. Elvera Lennox in endocrinology-insulin pump. Hyperlipidemia, hx of statin intolerance-now supposed to be on zetia.  However, zetia caused muscle and joint aches so he stopped it as well. He has not had cardiology f/u since the stent procedure in R carotid 01/2014. He was on DAPT for 3 mo after procedure but then only continued plavix b/c he says his aspirin rx was not renewed.  Reviewed notes from hospitalization 01/2014 today and it appears he is to stay on DAPT so I told him to restart ASA today and he needs a routine f/u with Dr. [name].  Has fallen a couple of times due to abnl eyesight/depth perception, fell onto right hand about 10d ago and it is still sore.   Cognitively he has some word finding difficulty still--chronic.  However, no significant new probs with thinking or memory.    Discussed screening colonoscopy today.  Explained benefits and risks of colonoscopy and he is finally agreeable to referral to GI for this.  Urology regularly checks PSA and does DRE--due this summer.  Past Medical History  Diagnosis Date  . Diabetes mellitus Dx'd age 21    Novolog via insulin pump; sub-optimal control x years  . High cholesterol     Takes 1/2 of  atorvastatin daily  . Hypothyroidism     Thyroidectomy 2002; multinodular goiter  . Erectile dysfunction     [place]  . Diabetic retinopathy     Proliferative: Hx of retinal detachment on right-light perception only in right eye  . History of tobacco abuse     Quit about 1990 (has 35 pack-yr hx)  . OSA (obstructive sleep apnea)     06/2011 sleep study: moderate  OSA, CPAP at 12 cm H2O.  . Impaired vision     right eye light perception only; hx of retinal detachment.  . Recurrent pneumonia   . GERD (gastroesophageal reflux disease)   . Venous insufficiency   . Diastolic dysfunction 2007    TEE with diastolic dysfunction, EF 74%.  . Diabetic neuropathy     fine touch and position sense affected  . Aortic stenosis, mild Sept/Oct /2013    Mild/mod by echo; mild by cath 10/28/11.  Mild progression by echo 05/2012--f/u echo annually recommended (patient asymptomatic).  . Carotid stenosis, right 09/2011    50-70% stenosis, stable on repeat dopplers 05/2012 but left sided velocities increased 01/2013. Dr. [name] suggests f/u doppler in 6 mo.  . 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  Med mgmt recommended.  . Abnormal stress test 10/27/2011    lat isch--cardiac cath 10/28/2011  . Venous reflux 10/14/2011    venous doppler-R GSV continuous reflux throughout; too small for VNUS closure  . Decreased pedal pulses     LE doppler 11/08/11- no evidence of arterial insufficiency  . TIA (transient ischemic attack) 2015/16    with R carotid dz: pt got R carotid stent 01/2014 by Dr. [name] and was put on dual antiplatelet  therapy with ASA 325mg  and plavix 75mg  qd afterwards    Past Surgical History  Procedure Laterality Date  . Thyroid surgery  2002  . Lasix eye    . Eye surgery      Multiple laser surgeries for diabetic retinopathy, also cataract surgery [facility].  [name]. Retinal detachment surgery      Right eye  . Cataract extraction      left  . Transthoracic echocardiogram  10/2011;12/2012    Mod AS, mild LVH, EF 60-65%, no RWMA-being followed by Dr. Allyson SabalBerry  . Cardiac catheterization  10/2011    EF normal.  Diffuse 3 vessel CAD, no stents placed.  Medical mgmt per [facility].  [name]. Left and right heart catheterization with coronary angiogram N/A 10/28/2011     Procedure: LEFT AND R0IHT HEART CATHETERIZATION WITH CORONARY ANGIOGRAM;  Surgeon: Lennette Biharihomas A Kelly, MD;  [facility];  Service: Cardiovascular;  Laterality: N/A;  . Carotid stent insertion Right 01/16/2014    Procedure: CAROTID STENT INSERTION;  Surgeon: Runell GessJonathan J Berry, MD;  [facility];  Service: Cardiovascular;  Laterality: Right;    Outpatient Prescriptions Prior to Visit  Medication Sig Dispense Refill  . clopidogrel (PLAVIX) 75 MG tablet Take 1 tablet (75 mg total) by mouth daily. 90 tablet 1  . clotrimazole-betamethasone (LOTRISONE) cream Apply to affected area twice daily as needed for rash (Patient taking differently: Apply 1 application topically 2 (two) times daily as needed (rash). ) 15 g 2  . Feverfew 380 MG CAPS Take 380 mg by mouth daily. For migraine headaches    . insulin aspart (NOVOLOG) 100 UNIT/ML injection PUMP PARAMETERS: BASAL RATE 0.8 U/H, MEAL: 1:11, 1:12, 1:15 BF, L, D, 1 U/75 ABOVE 130 60 mL 1  . Insulin Human (INSULIN PUMP) SOLN Inject into the skin continuous. Basal rate .95/hr, current pump settings: 12am .45, 2am .65, 7am .95, 11am .95, 5pm .75, 11pm .5    . levothyroxine (SYNTHROID, LEVOTHROID) 137 MCG tablet Take 1 tablet (137 mcg total) by mouth daily. 90 tablet 1  . Omega-3 Fatty Acids (FISH OIL) 1000 MG CAPS Take 1,000 mg by mouth daily.    . ranitidine (ZANTAC) 150 MG tablet Take 150 mg by mouth daily as needed for heartburn.    . tadalafil (CIALIS) 20 MG tablet Take 20 mg by mouth daily as needed for erectile dysfunction.     [name]. aspirin 325 MG EC tablet Take 1 tablet (325 mg total) by mouth daily. (Patient not taking: Reported on 04/09/2014) 30 tablet 1  . ezetimibe (ZETIA) 10 MG tablet Take 1 tablet (10 mg total) by mouth daily. (Patient not taking: Reported on 04/09/2014) 30 tablet 3   No facility-administered medications prior to visit.    Allergies  Allergen Reactions  . Statins Other (See Comments)    Problem with higher dosages  (Lipitor caused memory issues), also caused leg pains and lethargy  . Tape Rash    Adhesive tape.  (Paper tape OK)    ROS As per HPI  PE: Blood pressure 120/80, pulse 79, temperature 97.9 F (36.6 C), temperature source Oral, height 6\' 2"  (1.88 m), weight 219 lb (99.338 kg), SpO2 97 %. Gen: Alert, well appearing.  Patient is oriented to person, place, time, and situation. AFFECT: pleasant, lucid thought and speech. No further exam today.  LABS:  Lab Results  Component Value Date   HGBA1C 9.5* 01/13/2014    Lab Results  Component Value Date   TSH 6.16* 07/17/2013  Lab Results  Component Value Date   WBC 7.0 01/17/2014   HGB 12.7* 01/17/2014   HCT 36.9* 01/17/2014   MCV 87.9 01/17/2014   PLT 171 01/17/2014   Lab Results  Component Value Date   CREATININE 1.13 01/17/2014   BUN 16 01/17/2014   NA 137 01/17/2014   K 4.2 01/17/2014   CL 103 01/17/2014   CO2 28 01/17/2014   Lab Results  Component Value Date   ALT 18 01/12/2014   AST 23 01/12/2014   ALKPHOS 81 01/12/2014   BILITOT 0.5 01/12/2014   Lab Results  Component Value Date   CHOL 298* 01/13/2014   Lab Results  Component Value Date   HDL 66 01/13/2014   Lab Results  Component Value Date   LDLCALC 211* 01/13/2014   Lab Results  Component Value Date   TRIG 105 01/13/2014   Lab Results  Component Value Date   CHOLHDL 4.5 01/13/2014    IMPRESSION AND PLAN:  1) R carotid stenosis, hx of TIAs: he is now s/p R carotid stenting. Reviewed cardiology notes and it does appear he SHOULD still be on ASA + plavix, so I told him to restart ASA qd.  He is to arrange routine f/u with Dr. [name] soon.  2) Hypothyroidism: due for TSH monitoring today.  3) Hyperlipidemia: intolerant of statins and zetia.  At next f/u with Dr. [name] he can discuss possible new injection lipid-lowering therapy as an option.  4) Polypharmacy: reviewed meds and made sure pt understood everything he should be taking.  5) Colon  ca screening: he agrees to GI referral for this now.  An After Visit Summary was printed and given to the patient.   FOLLOW UP: Return in about 6 months (around 10/09/2014) for routine chronic illness f/u.

## 2014-04-09 NOTE — Patient Instructions (Signed)
Restart a daily aspirin (either 81mg  or 325mg  is fine).

## 2014-04-11 ENCOUNTER — Other Ambulatory Visit: Payer: Self-pay | Admitting: *Deleted

## 2014-04-11 DIAGNOSIS — E039 Hypothyroidism, unspecified: Secondary | ICD-10-CM

## 2014-04-15 ENCOUNTER — Telehealth: Payer: Self-pay | Admitting: Family Medicine

## 2014-04-15 NOTE — Telephone Encounter (Signed)
Pt requesting medication for neuropathy in legs.  Can you prescribe anything for pt?

## 2014-04-16 ENCOUNTER — Other Ambulatory Visit: Payer: Self-pay | Admitting: Family Medicine

## 2014-04-16 MED ORDER — GABAPENTIN 300 MG PO CAPS: ORAL_CAPSULE | ORAL | Status: DC

## 2014-04-16 NOTE — Telephone Encounter (Signed)
Patient called back to office, stated that he bought OTC vostirx cream and tylenol extra strength and he would like to have an Rx for them so he can get reimbursed on his flex spending account.   He also would still like an RX for something else because neither of those products helped his pain.

## 2014-04-16 NOTE — Telephone Encounter (Signed)
Tell him sorry I don't write any rx's for OTC meds.  I'm afraid this is an out of pocket cost he just has to absorb. I will send in gabapentin to try for his neuropathy pain in legs.-thx

## 2014-04-17 NOTE — Telephone Encounter (Signed)
Patient aware.  He will begin gabapentin.

## 2014-04-23 ENCOUNTER — Ambulatory Visit (INDEPENDENT_AMBULATORY_CARE_PROVIDER_SITE_OTHER): Payer: PRIVATE HEALTH INSURANCE | Admitting: Family Medicine

## 2014-04-23 ENCOUNTER — Encounter: Payer: Self-pay | Admitting: Family Medicine

## 2014-04-23 VITALS — BP 115/74 | HR 81 | Temp 98.7°F | Resp 18 | Ht 74.0 in | Wt 220.0 lb

## 2014-04-23 DIAGNOSIS — J069 Acute upper respiratory infection, unspecified: Secondary | ICD-10-CM

## 2014-04-23 DIAGNOSIS — J208 Acute bronchitis due to other specified organisms: Secondary | ICD-10-CM

## 2014-04-23 MED ORDER — HYDROCODONE-HOMATROPINE 5-1.5 MG/5ML PO SYRP: ORAL_SOLUTION | ORAL | Status: DC

## 2014-04-23 MED ORDER — ALBUTEROL SULFATE HFA 108 (90 BASE) MCG/ACT IN AERS: 1.0000 | INHALATION_SPRAY | RESPIRATORY_TRACT | Status: DC | PRN

## 2014-04-23 NOTE — Progress Notes (Signed)
Pre visit review using our clinic review tool, if applicable. No additional management support is needed unless otherwise documented below in the visit note. 

## 2014-04-23 NOTE — Patient Instructions (Signed)
Buy OTC mucinex DM and take as directed on packaging for daytime cough.  Try to save your hydrocodone cough syrup for night-time use. Buy OTC saline nasal spray (generic) and use 2-3 sprays in each nostril 2-3 times a day to irrigate and moisturize nasal passages (esp before going to bed).

## 2014-04-23 NOTE — Progress Notes (Signed)
OFFICE NOTE  04/23/2014  CC:  Chief Complaint  Patient presents with  . Nasal Congestion    x Monday night  . Cough    HPI: Patient is a 64 y.o. Caucasian male who is here for respiratory complaints. Onset 2 days ago with bad ST, voice has started to go out, some runny nose, nonproductive cough, no fevers. Definite fatigue, minimal appetite, no body aches.  No rash.  HA onset today.  No pain in upper teeth or face. ST not as bad as it was at the onset.  No SOB or chest tightness or CP.   Pertinent PMH:  Past medical, surgical, social, and family history reviewed and no changes are noted since last office visit.  MEDS:  Outpatient Prescriptions Prior to Visit  Medication Sig Dispense Refill  . clopidogrel (PLAVIX) 75 MG tablet Take 1 tablet (75 mg total) by mouth daily. 90 tablet 1  . clotrimazole-betamethasone (LOTRISONE) cream Apply to affected area twice daily as needed for rash (Patient taking differently: Apply 1 application topically 2 (two) times daily as needed (rash). ) 15 g 2  . Feverfew 380 MG CAPS Take 380 mg by mouth daily. For migraine headaches    . gabapentin (NEURONTIN) 300 MG capsule 1 tab po tid 90 capsule 6  . insulin aspart (NOVOLOG) 100 UNIT/ML injection PUMP PARAMETERS: BASAL RATE 0.8 U/H, MEAL: 1:11, 1:12, 1:15 BF, L, D, 1 U/75 ABOVE 130 60 mL 1  . levothyroxine (SYNTHROID, LEVOTHROID) 137 MCG tablet Take 1 tablet (137 mcg total) by mouth daily. 90 tablet 1  . Omega-3 Fatty Acids (FISH OIL) 1000 MG CAPS Take 1,000 mg by mouth daily.    . ranitidine (ZANTAC) 150 MG tablet Take 150 mg by mouth daily as needed for heartburn.    . Insulin Human (INSULIN PUMP) SOLN Inject into the skin continuous. Basal rate .95/hr, current pump settings: 12am .45, 2am .65, 7am .95, 11am .95, 5pm .75, 11pm .5    . tadalafil (CIALIS) 20 MG tablet Take 20 mg by mouth daily as needed for erectile dysfunction.     [name] aspirin 325 MG EC tablet Take 1 tablet (325 mg total) by mouth daily.  (Patient not taking: Reported on 04/23/2014) 30 tablet 1   No facility-administered medications prior to visit.    PE: Blood pressure 115/74, pulse 81, temperature 98.7 F (37.1 C), temperature source Temporal, resp. rate 18, height  (1.88 m), weight 220 lb (99.791 kg), SpO2 96 %. VS: noted--normal. Gen: alert, NAD, NONTOXIC APPEARING. HEENT: eyes without injection, drainage, or swelling.  Ears: EACs clear, TMs with normal light reflex and landmarks.  Nose: Clear rhinorrhea, with some dried, crusty exudate adherent to mildly injected mucosa.  No purulent d/c.  No paranasal sinus TTP.  No facial swelling.  Throat and mouth without focal lesion.  No pharyngial swelling, erythema, or exudate.   Neck: supple, no LAD.   LUNGS: CTA bilat, nonlabored resps.  Some mild post-exhalation coughing and a coarse rhonchorus sound at the end of a forced expiratory maneuver. CV: RRR, no m/r/g. EXT: no c/c/e SKIN: no rash   IMPRESSION AND PLAN:  Viral URI with acute bronchitis.  Minimal sign of RAD, but I don't recommend prednisone at this time, esp since pt has poorly controlled DM. Symptomatic care discussed. Hycodan syrup, 1-2 tsp po bid prn. Albuterol inhaler rx'd to use 1-2 p q4h prn. Signs/symptoms to call or return for were reviewed and pt expressed understanding.  An After Visit Summary  was printed and given to the patient.  FOLLOW UP: prn

## 2014-04-24 ENCOUNTER — Encounter: Payer: Self-pay | Admitting: Internal Medicine

## 2014-05-01 ENCOUNTER — Ambulatory Visit (HOSPITAL_BASED_OUTPATIENT_CLINIC_OR_DEPARTMENT_OTHER)
Admission: RE | Admit: 2014-05-01 | Discharge: 2014-05-01 | Disposition: A | Discharge status: Home / Self Care | Payer: PRIVATE HEALTH INSURANCE | Source: Ambulatory Visit | Attending: Family Medicine | Admitting: Family Medicine

## 2014-05-01 ENCOUNTER — Ambulatory Visit (INDEPENDENT_AMBULATORY_CARE_PROVIDER_SITE_OTHER): Payer: PRIVATE HEALTH INSURANCE | Admitting: Family Medicine

## 2014-05-01 ENCOUNTER — Encounter: Payer: Self-pay | Admitting: Family Medicine

## 2014-05-01 VITALS — BP 117/71 | HR 88 | Temp 98.1°F | Resp 18 | Ht 74.0 in | Wt 218.0 lb

## 2014-05-01 DIAGNOSIS — M25561 Pain in right knee: Secondary | ICD-10-CM

## 2014-05-01 DIAGNOSIS — M79645 Pain in left finger(s): Secondary | ICD-10-CM

## 2014-05-01 DIAGNOSIS — J069 Acute upper respiratory infection, unspecified: Secondary | ICD-10-CM

## 2014-05-01 DIAGNOSIS — M79644 Pain in right finger(s): Secondary | ICD-10-CM

## 2014-05-01 DIAGNOSIS — M25562 Pain in left knee: Secondary | ICD-10-CM | POA: Diagnosis not present

## 2014-05-01 DIAGNOSIS — M7989 Other specified soft tissue disorders: Secondary | ICD-10-CM | POA: Insufficient documentation

## 2014-05-01 DIAGNOSIS — M778 Other enthesopathies, not elsewhere classified: Secondary | ICD-10-CM | POA: Diagnosis not present

## 2014-05-01 DIAGNOSIS — R936 Abnormal findings on diagnostic imaging of limbs: Secondary | ICD-10-CM | POA: Diagnosis not present

## 2014-05-01 DIAGNOSIS — J208 Acute bronchitis due to other specified organisms: Secondary | ICD-10-CM

## 2014-05-01 NOTE — Progress Notes (Addendum)
OFFICE NOTE  05/01/2014  CC:  Chief Complaint  Patient presents with  . Wrist Injury    fell at work 06/12/13  . Knee Pain   HPI: Patient is a 64 y.o. Caucasian male who is here for ongoing R>L thumb pain s/p fall down steps in parking lot at work about 6 mo ago.  He simply lost his footing on the last step of a flight of steps.  He had no preceding dizziness or weakness or other symptoms. Landed on both palms and anterior aspect of both knees.   Has ongoing uncomfortable feeling at patellar tendon regions bilat as well as the discomfort ongoing in both thumb regions.  He is filing a workman's comp claim "just in case"-says this is what his employer advised him to do and he says he is just following their instructions.  Recent resp illness: sx's staying about the same.  Voice returning some today. Says cough "still pretty brutal".  No fever or SOB.  Pertinent PMH:  Past medical, surgical, social, and family history reviewed and no changes are noted since last office visit.  MEDS:  Outpatient Prescriptions Prior to Visit  Medication Sig Dispense Refill  . albuterol (VENTOLIN HFA) 108 (90 BASE) MCG/ACT inhaler Inhale 1-2 puffs into the lungs every 4 (four) hours as needed for wheezing or shortness of breath. 1 Inhaler 0  . aspirin 81 MG tablet Take 81 mg by mouth daily.    . clopidogrel (PLAVIX) 75 MG tablet Take 1 tablet (75 mg total) by mouth daily. 90 tablet 1  . clotrimazole-betamethasone (LOTRISONE) cream Apply to affected area twice daily as needed for rash (Patient taking differently: Apply 1 application topically 2 (two) times daily as needed (rash). ) 15 g 2  . Feverfew 380 MG CAPS Take 380 mg by mouth daily. For migraine headaches    . gabapentin (NEURONTIN) 300 MG capsule 1 tab po tid 90 capsule 6  . HYDROcodone-homatropine (HYCODAN) 5-1.5 MG/5ML syrup 1-2 tsp po bid prn 180 mL 0  . insulin aspart (NOVOLOG) 100 UNIT/ML injection PUMP PARAMETERS: BASAL RATE 0.8 U/H, MEAL:  1:11, 1:12, 1:15 BF, L, D, 1 U/75 ABOVE 130 60 mL 1  . Insulin Human (INSULIN PUMP) SOLN Inject into the skin continuous. Basal rate .95/hr, current pump settings: 12am .45, 2am .65, 7am .95, 11am .95, 5pm .75, 11pm .5    . levothyroxine (SYNTHROID, LEVOTHROID) 137 MCG tablet Take 1 tablet (137 mcg total) by mouth daily. 90 tablet 1  . Omega-3 Fatty Acids (FISH OIL) 1000 MG CAPS Take 1,000 mg by mouth daily.    . ranitidine (ZANTAC) 150 MG tablet Take 150 mg by mouth daily as needed for heartburn.    . tadalafil (CIALIS) 20 MG tablet Take 20 mg by mouth daily as needed for erectile dysfunction.      No facility-administered medications prior to visit.    PE: Blood pressure 117/71, pulse 88, temperature 98.1 F (36.7 C), temperature source Temporal, resp. rate 18, height 6\' 2"  (1.88 m), weight 218 lb (98.884 kg), SpO2 95 %. Gen: Alert, well appearing.  Patient is oriented to person, place, time, and situation. ZOX:WRUEENT:Eyes: no injection, icteris, swelling, or exudate.  EOMI, PERRLA. Mouth: lips without lesion/swelling.  Oral mucosa pink and moist. Oropharynx without erythema, exudate, or swelling.  CV: RRR, systolic murmur LUNGS: CTA bilat except occ rhonchi that clear with coughing.  Breathing nonlabored, exp phase not prolonged. Wrists: ROM fully intact, without tenderness to palpation, without erythema. Thumbs: R>L  mild TTP at proximal aspect of thumb, not specifically located over/on a joint, but conspicuously absent tenderness along thumb extensors/abductors, negative finkelstein's testing.  ROM of both thumbs fully intact.  No swelling, erythema, or warmth of thumbs or any joints of the hands. Knees: no swelling/effusion, no warmth, no erythema.  No tenderness, just mild "uncomfortable" feeling to palpation over patellar tendon region of both knees.  No bruising.  ROM fully intact bilat, no instability.  IMPRESSION AND PLAN:  1) Bilat thumb pain/strain and bilat anterior knee pain--slow to  resolve s/p fall onto these areas approx 6 mo ago. Pt fitted with R wrist/thumb spica splint today, instructions given, we'll see how he's feeling in 1 mo---sports med referral if not improving at that time. Will x-ray both thumbs and both knees to make sure no acute bony/joint abnormality is evident.  2) URI/bronchitis, suspect viral etiology. No sign of bacterial infection or RAD at this time. Continue symptomatic care.  An After Visit Summary was printed and given to the patient.  FOLLOW UP: 1 mo  Upon review of patient's thumb and knee x-rays once again, it appears he may have some changes consistent with pseudogout. He has chondrocalcinosis in both knees as well as a significant amount of joint space narrowing/DJD changes in the first carpometacarpal articulations bilat.  Unclear significance of the possible heterotopic ossification noted by the radiologist, and I cannot really identify any abnormality where this finding is being pointed out on the x-ray. Discussed options with pt: he can return and I can attempt to aspirate synovial fluid from one of his knees for analysis to try to confirm CPP crystals or I can asks a rheumatologist to evaluate him further. Pt is concerned b/c his sx's have been going on for at least a year now.  He chose to go ahead and see a rheumatologist for further evaluation.  Entered referral order today--PM.

## 2014-05-01 NOTE — Progress Notes (Signed)
Pre visit review using our clinic review tool, if applicable. No additional management support is needed unless otherwise documented below in the visit note. 

## 2014-05-02 ENCOUNTER — Other Ambulatory Visit: Payer: Self-pay | Admitting: Family Medicine

## 2014-05-02 DIAGNOSIS — M79645 Pain in left finger(s): Secondary | ICD-10-CM

## 2014-05-02 DIAGNOSIS — M79644 Pain in right finger(s): Secondary | ICD-10-CM

## 2014-05-02 DIAGNOSIS — M25562 Pain in left knee: Secondary | ICD-10-CM

## 2014-05-02 DIAGNOSIS — M1120 Other chondrocalcinosis, unspecified site: Secondary | ICD-10-CM

## 2014-05-02 DIAGNOSIS — M25561 Pain in right knee: Secondary | ICD-10-CM

## 2014-05-07 ENCOUNTER — Ambulatory Visit (INDEPENDENT_AMBULATORY_CARE_PROVIDER_SITE_OTHER): Payer: PRIVATE HEALTH INSURANCE | Admitting: Internal Medicine

## 2014-05-07 ENCOUNTER — Encounter: Payer: Self-pay | Admitting: Internal Medicine

## 2014-05-07 VITALS — BP 122/68 | HR 86 | Temp 97.7°F | Resp 12 | Wt 218.0 lb

## 2014-05-07 DIAGNOSIS — E1042 Type 1 diabetes mellitus with diabetic polyneuropathy: Secondary | ICD-10-CM

## 2014-05-07 DIAGNOSIS — I639 Cerebral infarction, unspecified: Secondary | ICD-10-CM

## 2014-05-07 DIAGNOSIS — G990 Autonomic neuropathy in diseases classified elsewhere: Secondary | ICD-10-CM | POA: Diagnosis not present

## 2014-05-07 LAB — HEMOGLOBIN A1C: Hgb A1c MFr Bld: 8.2 % — ABNORMAL HIGH (ref 4.6–6.5)

## 2014-05-07 NOTE — Patient Instructions (Addendum)
Please change the pump settings as follows: - basal rate:  MN: 0.55 2 am: 0.75 7 am: 1.1 11 am: 1.1 5 PM: 0.85 11pm 0.65 - ICR:  12 am: 15 6 am: 12 10 am 12 4 pm: 10 - ISF:   MN: 30   11 am: 25  9 pm: 25 >> 40 - target:  MN: 140 5:30 am: 110  10 pm: 120 >> 140   - IOB: 4 h   Please stop at the lab.  Please come back for a follow-up appointment in 3 months.

## 2014-05-07 NOTE — Progress Notes (Signed)
Patient ID: [name], male   DOB: 11/04/1950, 64 y.o.   MRN: [id number]  HPI: [name] is a 64 y.o.-year-old male, returning for f/u for DM1, dx 1964 (64 y/o), uncontrolled, with complications (CAD, proliferative diabetic retinopathy-history of retinal detachment OD, right carotid stenosis, cerebrovascular disease - s/p TIA 01/2014, PVD, peripheral neuropathy, ED). Last visit 1.5 mo ago.  He had bronchitis in the last 2 weeks. He did not have to take antibiotics or steroids. His sugars were higher, especially later in the day, but he did not adjust his basal rates or insulin to carb ratios based on this. He brought me the pump download before he changed his pump 2 weeks ago and I did send him a message with pump settings changes >> he did not read it and did not make this changes.  Last hemoglobin A1c was: Lab Results  Component Value Date   HGBA1C 9.5* 01/13/2014   HGBA1C 8.9* 11/12/2013   HGBA1C 9.6* 07/17/2013   Pt is on an insulin pump: One Touch Ping ([employer]) with NovoLog. He got a replacement pump b/c errors in the old pump.   At last visit, we made several changes in his pump settings: - basal rate:  MN: 0.45 >> 0.55 2 am: 0.65 >> 0.75 7 am: 0.95 >> 1.1 11 am: 0.95 >> 1.1 5 PM: 0.75 >> 0.85 11 pm 0.50 >> 0.65 - ICR:  12 am: 15 6 am: 12 10 am 12 4 pm: 10 - ISF:   MN: 30   11 am: 25  10 pm: 25 - target:  MN: 140 5:30 am: 110  10 pm: 120    - IOB: 4 h  TDD bolus 50% TDD basal 50% Total daily dose 40.8 units  Pt checks his sugars 4.9x a day and they are still high, but a little improved - ave 207+/-86. However, the high average is due to his recent period of bronchitis with higher sugars, in the 200s and 300s. 4 days ago, his study to improve and he sugars are definitely improved.  - am: 98-160-261 >> 56-180, 236 >> 54, 87-265, 377 >> 112-266, 300, 343 >> 66-204 - 2h after b'fast: n/c >> 191-284 >> 190, 441 >> n/c >> 110-315 >> 130-140 - before lunch:175-250  >> 125-230-291 >> 170-311 >> 119-228, 326 >> 141-208 >> 52-183, 355 when sick - 2h after lunch: 295 x 1 >> 65 x1, 281, 410 x1 >> 197-301 >> n/c >> 99-212, 409 >> 128, 337 when sick - before dinner: 190-466 >> 58, 100-179 >> 57x1 (over correction), 102-214, 352 >> 91-321, higher values when sick  - bedtime: 300-400s >> 112-450 >> 84-315 >> 125-391 >> 161-388 >> 139-333 >> 87, 116-282  - nighttime: 66 >> 56, 190-407 >> 275, 172 >> 110, 193, 393 >> 110-190, 295  + few lows. Lowest sugar was 40s (after physical exertion, since he did not adjust his insulin dosing); he has hypoglycemia awareness at 100. Highest: 400s  He has a new meter: [employer].  Pt's meals are: - Breakfast: 1.5 scramble eggs, toast, milk - 65 g carbs - Lunch: sandwich, shaved chicken or [place] breast, cheese, mayo, chips, soda - 100 carbs - Dinner: meat, vegetables, bread, fruit, milk - 100 g carbs - Snacks: 2-3 a day: bagel midmorning, evening snack: chips, popcorn  Pt does have mild chronic kidney disease, last BUN/creatinine was:  Lab Results  Component Value Date   BUN 16 01/17/2014   CREATININE 1.13 01/17/2014  Last microalbumin/creatinine ratio  was 3.8.  Last set of lipids: Lab Results  Component Value Date   CHOL 298* 01/13/2014   HDL 66 01/13/2014   LDLCALC 211* 01/13/2014   LDLDIRECT 159.5 04/21/2011   TRIG 105 01/13/2014   CHOLHDL 4.5 01/13/2014  He is not on a statin, this is listed as an intolerance. Pt's [facility] He has proliferative DR, s/p detached retina. He was advised to return in a year. Has numbness and tingling in his legs.  He also has a history of hyperlipidemia, disequilibrium, mild cognitive impairment, hypothyroidism-status post thyroidectomy for multinodular goiter. He also has OSA-on CPAP, GERD.  I reviewed pt's medications, allergies, PMH, social hx, family hx, and changes were documented in the history of present illness. Otherwise,  unchanged from my initial visit note. ROS: Constitutional: no weight gain, no fatigue, no subjective hyperthermia/hypothermia Eyes: no blurry vision, no xerophthalmia ENT: no sore throat, no nodules palpated in throat, no dysphagia/odynophagia, no hoarseness Cardiovascular: no CP/SOB/palpitations/ leg swelling Respiratory: no cough/SOB Gastrointestinal: no N/V/D/C Musculoskeletal: no muscle aches/joint aches Skin: no rashes Neurological: no tremors/numbness/tingling/dizziness  PE: BP 122/68 mmHg  Pulse 86  Temp(Src) 97.7 F (36.5 C) (Oral)  Resp 12  Wt 218 lb (98.884 kg)  SpO2 95% Body mass index is 27.98 kg/(m^2). Wt Readings from Last 3 Encounters:  05/07/14 218 lb (98.884 kg)  05/01/14 218 lb (98.884 kg)  04/23/14 220 lb (99.791 kg)  There is no weight on file to calculate BMI.  Constitutional: slightly overweight, in NAD Eyes: PERRLA, EOMI, no exophthalmos ENT: moist mucous membranes, no thyromegaly, no cervical lymphadenopathy Cardiovascular: RRR, + 2/6 SEM, no RG Respiratory: CTA B Gastrointestinal: abdomen soft, NT, ND, BS+ Musculoskeletal: no deformities, strength intact in all 4 Skin: moist, warm, no rashes  ASSESSMENT: 1. 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  Patient with long-standing type 1 diabetes, poorly controlled, with multiple complications, on an insulin pump, however without good understanding of the pump principles and settings. He did not make the pump settings changes that I suggested 2 weeks ago. His sugars improved after his bronchitis episode, but he tells me that he is advised to bolus even if he is in the 130s  at bedtime. We will increase the insulin sensitivity factor to 40 and also his target blood sugar 140 at night. We also discussed about temporary basal rates which are needed if he stays active (start with a 10-20% decrease) or if he is sick (10-20% increase). [name], our diabetes educator, showed him how to make these changes. Patient Instructions  Please change the pump settings as follows: - basal rate:  MN: 0.55 2 am: 0.75 7 am: 1.1 11 am: 1.1 5 PM: 0.85 11pm 0.65 - ICR:  12 am: 15 6 am: 12 10 am 12 4 pm: 10 - ISF:   MN: 30   11 am: 25  9 pm: 25 >> 40 - target:  MN: 140 5:30 am: 110  10 pm: 120 >> 140   - IOB: 4 h   Please stop at the lab.  Please come back for a follow-up appointment in 3 months.   - I will see  him in 3 mo - will check a hemoglobin A1c today, but this would be higher due to recent higher sugars with his bronchitis   - time spent with the patient: 40 min, of which >50% was spent in reviewing his pump downloads, discussing his hypo- and hyper-glycemic episodes, reviewing his previous labs and pump settings and developing a plan to avoid hypo- an hyper-glycemia. Please also see topics discussed above.  Office Visit on 05/07/2014  Component Date Value Ref Range Status  . Hgb A1c MFr Bld 05/07/2014 8.2* 4.6 - 6.5 % Final   Glycemic Control Guidelines for People with Diabetes:Non Diabetic:  <6%Goal of Therapy: <7%Additional Action Suggested:  >8%   Surprisingly, hemoglobin A1c is better.

## 2014-05-12 ENCOUNTER — Ambulatory Visit: Payer: Self-pay | Admitting: Family Medicine

## 2014-06-06 ENCOUNTER — Encounter: Payer: Self-pay | Admitting: Internal Medicine

## 2014-06-06 ENCOUNTER — Ambulatory Visit (INDEPENDENT_AMBULATORY_CARE_PROVIDER_SITE_OTHER): Payer: PRIVATE HEALTH INSURANCE | Admitting: Internal Medicine

## 2014-06-06 VITALS — BP 130/68 | HR 70 | Ht 74.0 in | Wt 220.0 lb

## 2014-06-06 DIAGNOSIS — Z794 Long term (current) use of insulin: Secondary | ICD-10-CM | POA: Diagnosis not present

## 2014-06-06 DIAGNOSIS — I779 Disorder of arteries and arterioles, unspecified: Secondary | ICD-10-CM

## 2014-06-06 DIAGNOSIS — Z1211 Encounter for screening for malignant neoplasm of colon: Secondary | ICD-10-CM

## 2014-06-06 DIAGNOSIS — E109 Type 1 diabetes mellitus without complications: Secondary | ICD-10-CM | POA: Diagnosis not present

## 2014-06-06 DIAGNOSIS — Z7902 Long term (current) use of antithrombotics/antiplatelets: Secondary | ICD-10-CM | POA: Diagnosis not present

## 2014-06-06 DIAGNOSIS — I739 Peripheral vascular disease, unspecified: Principal | ICD-10-CM

## 2014-06-06 NOTE — Progress Notes (Addendum)
Referred by: [name], MD  Subjective:    Patient ID: [name], male    DOB: [birth year], 64 y.o.   MRN: [id number] Chief complaint: Colon cancer screening HPI The patient is a very nice 64 year old man here to discuss colon cancer screening. He has comorbidities of carotid artery disease status post a right carotid artery stent because of TIAs, coronary artery disease, and diabetes mellitus type 1. He takes clopidogrel and is on insulin pump. He has no active GI symptoms at this time except for rare heartburn he uses as needed ranitidine. Occasional flatulence.  He has been concerned about having a colonoscopy because he had family members and other acquaintances that of suffered perforations and complications. He is prepared to have a colonoscopy at this time. Allergies  Allergen Reactions  . Statins Other (See Comments)    Problem with higher dosages (Lipitor caused memory issues), also caused leg pains and lethargy  . Tape Rash    Adhesive tape.  (Paper tape OK)   Outpatient Prescriptions Prior to Visit  Medication Sig Dispense Refill  . albuterol (VENTOLIN HFA) 108 (90 BASE) MCG/ACT inhaler Inhale 1-2 puffs into the lungs every 4 (four) hours as needed for wheezing or shortness of breath. 1 Inhaler 0  . aspirin 81 MG tablet Take 81 mg by mouth daily.    . clopidogrel (PLAVIX) 75 MG tablet Take 1 tablet (75 mg total) by mouth daily. 90 tablet 1  . clotrimazole-betamethasone (LOTRISONE) cream Apply to affected area twice daily as needed for rash (Patient taking differently: Apply 1 application topically 2 (two) times daily as needed (rash). ) 15 g 2  . Feverfew 380 MG CAPS Take 380 mg by mouth daily. For migraine headaches    . gabapentin (NEURONTIN) 300 MG capsule 1 tab po tid 90 capsule 6  . HYDROcodone-homatropine (HYCODAN) 5-1.5 MG/5ML syrup 1-2 tsp po bid prn 180 mL 0  . insulin aspart (NOVOLOG) 100 UNIT/ML injection PUMP PARAMETERS: BASAL RATE 0.8 U/H, MEAL: 1:11,  1:12, 1:15 BF, L, D, 1 U/75 ABOVE 130 60 mL 1  . Insulin Human (INSULIN PUMP) SOLN Inject into the skin continuous. Basal rate .95/hr, current pump settings: 12am .45, 2am .65, 7am .95, 11am .95, 5pm .75, 11pm .5    . levothyroxine (SYNTHROID, LEVOTHROID) 137 MCG tablet Take 1 tablet (137 mcg total) by mouth daily. 90 tablet 1  . Omega-3 Fatty Acids (FISH OIL) 1000 MG CAPS Take 1,000 mg by mouth daily.    . ranitidine (ZANTAC) 150 MG tablet Take 150 mg by mouth daily as needed for heartburn.    . tadalafil (CIALIS) 20 MG tablet Take 20 mg by mouth daily as needed for erectile dysfunction.      No facility-administered medications prior to visit.   Past Medical History  Diagnosis Date  . Diabetes mellitus Dx'd age 12    Novolog via insulin pump; sub-optimal control x years  . High cholesterol     Takes 1/2 of  atorvastatin daily  . Hypothyroidism     Thyroidectomy 2002; multinodular goiter  . Erectile dysfunction     Se[place] urologist in [place]  . Diabetic retinopathy     Proliferative: Hx of retinal detachment on right-light perception only in right eye  . History of tobacco abuse     Quit about 1990 (has 35 pack-yr hx)  . OSA (obstructive sleep apnea)     06/2011 sleep study: moderate OSA, CPAP at 12 cm H2O.  . Impaired vision  right eye light perception only; hx of retinal detachment.  . Recurrent pneumonia   . GERD (gastroesophageal reflux disease)   . Venous insufficiency   . Diastolic dysfunction 2007    TEE with diastolic dysfunction, EF 74%.  . Diabetic neuropathy     fine touch and position sense affected  . Aortic stenosis, mild Sept/Oct /2013    Mild/mod by echo; mild by cath 10/28/11.  Mild progression by echo 05/2012--f/u echo annually recommended (patient asymptomatic).  . Carotid stenosis, right 09/2011    50-70% stenosis, stable on repeat dopplers 05/2012 but left sided velocities increased 01/2013. Dr. [name] suggests f/u doppler in 6 mo.  . CAD, multiple vessel  10/2011    Inferolateral perfusion defect + EKG abnormality during stress testing prompted Cath by [facility]; 3V CAD, EF normal.  Med mgmt recommended.  . Abnormal stress test 10/27/2011    lat isch--cardiac cath 10/28/2011  . Venous reflux 10/14/2011    venous doppler-R GSV continuous reflux throughout; too small for VNUS closure  . Decreased pedal pulses     LE doppler 11/08/11- no evidence of arterial insufficiency  . TIA (transient ischemic attack) 2015/16    with R carotid dz: pt got R carotid stent 01/2014 by Dr. [name] and was put on dual antiplatelet therapy with ASA  and plavix  qd afterwards   Past Surgical History  Procedure Laterality Date  . Thyroid surgery  2002  . Lasix eye    . Eye surgery      Multiple laser surgeries for diabetic retinopathy, also cataract surgery [facility].  [name] Retinal detachment surgery      Right eye  . Cataract extraction      left  . Transthoracic echocardiogram  10/2011;12/2012    Mod AS, mild LVH, EF 60-65%, no RWMA-being followed by Dr. [name]  . Cardiac catheterization  10/2011    EF normal.  Diffuse 3 vessel CAD, no stents placed.  Medical mgmt per [facility].  [name] Left and right heart catheterization with coronary angiogram N/A 10/28/2011    Procedure: LEFT AND R0IHT HEART CATHETERIZATION WITH CORONARY ANGIOGRAM;  Surgeon: Lennette Bihari, MD;  Location: [facility] CATH LAB;  Service: Cardiovascular;  Laterality: N/A;  . Carotid stent insertion Right 01/16/2014    Procedure: CAROTID STENT INSERTION;  Surgeon: Runell Gess, MD;  Location: [facility] CATH LAB;  Service: Cardiovascular;  Laterality: Right;   History   Social History  . Marital Status: Married    Spouse Name: N/A  . Number of Children: N/A  . Years of Education: N/A   Social History Main Topics  . Smoking status: Former Games developer  . Smokeless tobacco: Former Neurosurgeon    Quit date: 06/22/1987  . Alcohol Use: 0.0 oz/week    0 Standard drinks or equivalent per week  . Drug Use: No        Social  History Narrative   Divorced x1, remarried.  Has 3 daughters from first marriage.   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   Distant tobacco abuse (35 pack-yr hx), quit in the late 1990s, no ETOH or drugs.   No exercise.   Family History  Problem Relation Age of Onset  . Cancer Mother     lung cancer  . Alcohol abuse Father   . Cancer Father     laryngeal cancer  . Cancer Brother  oldest brother had lung cancer and melanoma    Review of Systems Some worsening memory issues. All other review of systems are negative or as per history of present illness.    Objective:   Physical Exam @BP  130/68 mmHg  Pulse 70  Ht 6\' 2"  (1.88 m)  Wt 220 lb (99.791 kg)  BMI 28.23 kg/m2@  General:  Well-developed, well-nourished and in no acute distress Eyes:  anicteric. Neck:   supple w/o thyromegaly or mass.  Lungs: Clear to auscultation bilaterally. Anterior Heart:  S1S2, no rubs, there is a soft systolic ejection murmur at the right upper sternal border no gallops. Rectal: Deferred until colonoscopy Lymph:  no cervical or supraclavicular adenopathy. Psych:  appropriate mood and  Affect.   Data Reviewed: January 2016 hospitalization records, cardiology notes from 2016, recent primary care notes, endocrinology notes hemoglobin A1c was 8.2% recently.     Assessment & Plan:   1. Right-sided carotid artery disease   2. Insulin dependent type 1 diabetes mellitus   3. Long term current use of antithrombotics/antiplatelets   4. Long term current use of insulin   5. Colon cancer screening    We discussed the various options to screen for colon cancer including Hemoccults, CT colonoscopy, optical colonoscopy, and fecal DNA testing. He is prepared to have a colonoscopy. Full discussion of the risks benefits and indications were explained. He does have some increased risk of problems  related to his insulin pump, and possibility of hypoglycemia, so we would need help from his endocrinologist regarding that. He does also need to know when it's reasonable for him to come off clopidogrel given that he has a carotid stent. It might be that he should not stop that electively for a year. I will find out about that from his cardiovascular physician Dr. Allyson SabalBerry.  He takes aspirin and clopidogrel daily. He is at increased risk of bleeding peptic ulcer disease because of this. We talked about using a daily PPI. He has some concerns about the new reports of memory loss with that and the fact that he has memory loss already. In general I put patients like this on a daily low-dose PPI. Taking into consideration his situation I recommended he take ranitidine 150 mg daily.  I appreciate the opportunity to care for this patient. CC: Jeoffrey MassedMCGOWEN,PHILIP H, MD Nathaniel BattyJonathan Carter M.D. And Carlus Pavlovristina Gherghe, MD  We have ok to hold clopidogrel and endocrinologist response below   Dr Leone PayorGessner,    I will let pt know that he can continue the same pump basal rates during the prep and just not take the boluses until he eats a normal meal.    Thank you for the update,    Silvestre MesiCristina    06/18/2014 10:20 PM Iva Booparl E. Gessner, MD, Clementeen GrahamFACG

## 2014-06-06 NOTE — Patient Instructions (Addendum)
  Dr. Stan Headarl Gessner is going to consult with Dr. Jeri CosJohnathan Berry about you and get back in touch with you.    Take your ranitidine daily.   I appreciate the opportunity to care for you. Stan Headarl Gessner, M.D., [facility]

## 2014-06-10 ENCOUNTER — Telehealth: Payer: Self-pay

## 2014-06-10 NOTE — Telephone Encounter (Signed)
-----   Message from Iva Booparl E Gessner, MD sent at 06/06/2014  5:39 PM EDT ----- Regarding: FW: ? re: clopidogrel - holding for colonoscopy OK for him to stop clopidogrel per Dr. Allyson SabalBerry  Let patient know  He will need a previsit and will also need advice from Dr. Elvera LennoxGherghe (endocrinologist) about adjusting insulin pump   ----- Message -----    From: Runell GessJonathan J Berry, MD    Sent: 06/06/2014   5:18 PM      To: Iva Booparl E Gessner, MD Subject: RE: ? re: clopidogrel - holding for colonosc#  Okay to stop clopidogrel for colonoscopy ----- Message -----    From: Iva Booparl E Gessner, MD    Sent: 06/06/2014   9:58 AM      To: Runell GessJonathan J Berry, MD Subject: ? re: clopidogrel - holding for colonoscopy    I saw him today re: screening colonoscopy  He is willing to have a screening colonoscopy - you stented R carotid 01/2014   When would it be ok to hold clopidogrel but leave on ASA for 5-7 d prior to an elective colonoscopy.   Its fine to wait 1 year from stent before we scope.  Can also do on clopidogrel if better to not ever come off. Though usu prefer off to reduce bleeding risks.

## 2014-06-11 NOTE — Telephone Encounter (Signed)
Informed patient of Dr. Hazle CocaBerry's recommendations regarding holding his Plavix prior to a Colonoscopy. Asked patient if we could go ahead and schedule now his Colonoscopy and nurse visit. Patient states he wants to wait after the 4th of July. I told patient that we can go ahead and schedule after that time or even August if he wanted. Patient states he would like to call back. Informed patient that was fine. Also told him that when he does schedule the procedure at the time of the nurse visit we will have to send a letter to his Endocrinologist to get recommendations for his insulin pump prior to the Colonoscopy. Pt verbalized understanding.

## 2014-06-25 ENCOUNTER — Telehealth (HOSPITAL_COMMUNITY): Payer: Self-pay | Admitting: *Deleted

## 2014-06-25 ENCOUNTER — Other Ambulatory Visit: Payer: Self-pay | Admitting: Cardiovascular Disease

## 2014-06-25 DIAGNOSIS — I739 Peripheral vascular disease, unspecified: Secondary | ICD-10-CM

## 2014-06-27 ENCOUNTER — Other Ambulatory Visit: Payer: Self-pay | Admitting: *Deleted

## 2014-06-27 MED ORDER — INSULIN ASPART 100 UNIT/ML ~~LOC~~ SOLN: SUBCUTANEOUS | Status: DC

## 2014-06-30 ENCOUNTER — Other Ambulatory Visit: Payer: Self-pay

## 2014-07-01 ENCOUNTER — Other Ambulatory Visit: Payer: Self-pay | Admitting: *Deleted

## 2014-07-01 MED ORDER — INSULIN ASPART 100 UNIT/ML ~~LOC~~ SOLN: SUBCUTANEOUS | Status: DC

## 2014-07-01 NOTE — Telephone Encounter (Signed)
Ins required a specific amount of units daily.

## 2014-07-10 ENCOUNTER — Other Ambulatory Visit: Payer: Self-pay | Admitting: Cardiovascular Disease

## 2014-07-10 DIAGNOSIS — I739 Peripheral vascular disease, unspecified: Secondary | ICD-10-CM

## 2014-07-14 ENCOUNTER — Telehealth: Payer: Self-pay | Admitting: Cardiovascular Disease

## 2014-07-14 NOTE — Telephone Encounter (Signed)
Spoke with patient regarding appointment for Carotid doppler that was scheduled for 08/16/14----actually not due until January 2017.   Patient voiced his understanding.

## 2014-07-17 ENCOUNTER — Other Ambulatory Visit: Payer: Self-pay | Admitting: *Deleted

## 2014-07-17 MED ORDER — LEVOTHYROXINE SODIUM 137 MCG PO TABS: 137.0000 ug | ORAL_TABLET | Freq: Every day | ORAL | Status: DC

## 2014-07-17 NOTE — Telephone Encounter (Signed)
Pt LMOM stating that he needed RF for Levothyroxine. LOV: 05/01/14 NOV: 10/09/14 Last written: 02/18/14 #90 w/ 1RF.

## 2014-07-25 ENCOUNTER — Encounter (HOSPITAL_COMMUNITY): Payer: Self-pay | Admitting: *Deleted

## 2014-08-01 ENCOUNTER — Telehealth: Payer: Self-pay | Admitting: Cardiovascular Disease

## 2014-08-01 NOTE — Telephone Encounter (Signed)
Closed encounter °

## 2014-08-06 ENCOUNTER — Encounter (HOSPITAL_COMMUNITY): Payer: PRIVATE HEALTH INSURANCE

## 2014-08-06 ENCOUNTER — Ambulatory Visit (HOSPITAL_COMMUNITY)
Admission: RE | Admit: 2014-08-06 | Discharge: 2014-08-06 | Disposition: A | Discharge status: Home / Self Care | Payer: PRIVATE HEALTH INSURANCE | Source: Ambulatory Visit | Attending: Cardiovascular Disease | Admitting: Cardiovascular Disease

## 2014-08-06 ENCOUNTER — Inpatient Hospital Stay (HOSPITAL_COMMUNITY): Admission: RE | Admit: 2014-08-06 | Payer: PRIVATE HEALTH INSURANCE | Source: Ambulatory Visit

## 2014-08-06 DIAGNOSIS — I739 Peripheral vascular disease, unspecified: Secondary | ICD-10-CM | POA: Diagnosis not present

## 2014-08-07 ENCOUNTER — Other Ambulatory Visit: Payer: Self-pay | Admitting: *Deleted

## 2014-08-07 MED ORDER — INSULIN LISPRO 100 UNIT/ML ~~LOC~~ SOLN: SUBCUTANEOUS | Status: DC

## 2014-08-07 NOTE — Telephone Encounter (Signed)
Ins no longer covers Novolog. Switching to Humalog.

## 2014-08-13 ENCOUNTER — Ambulatory Visit (INDEPENDENT_AMBULATORY_CARE_PROVIDER_SITE_OTHER): Payer: PRIVATE HEALTH INSURANCE | Admitting: Internal Medicine

## 2014-08-13 ENCOUNTER — Encounter: Payer: Self-pay | Admitting: Internal Medicine

## 2014-08-13 ENCOUNTER — Other Ambulatory Visit (INDEPENDENT_AMBULATORY_CARE_PROVIDER_SITE_OTHER): Payer: PRIVATE HEALTH INSURANCE | Admitting: *Deleted

## 2014-08-13 VITALS — BP 132/68 | HR 78 | Temp 97.8°F | Resp 12 | Wt 215.8 lb

## 2014-08-13 DIAGNOSIS — G990 Autonomic neuropathy in diseases classified elsewhere: Secondary | ICD-10-CM

## 2014-08-13 DIAGNOSIS — E1042 Type 1 diabetes mellitus with diabetic polyneuropathy: Secondary | ICD-10-CM

## 2014-08-13 DIAGNOSIS — I639 Cerebral infarction, unspecified: Secondary | ICD-10-CM

## 2014-08-13 LAB — POCT GLYCOSYLATED HEMOGLOBIN (HGB A1C): HEMOGLOBIN A1C: 8.4

## 2014-08-13 NOTE — Patient Instructions (Addendum)
Please change the pump settings as follows: - basal rate:  MN: 0.55 2 am: 0.75 7 am: 1.1 11 am: 1.1 5 PM: 0.85 11pm 0.65 - ICR:  12 am: 15 6 am: 12 >> 10 10 am 12 4 pm: 10 - ISF:   MN: 30   11 am: 25  9 pm: 40 - target:  MN: 140 5:30 am: 110  10 pm: 140   - IOB: 4 h   Please stop drinking sweet tea.  Please bolus for lunch every day.  Please come back for a follow-up appointment in 3 months.

## 2014-08-13 NOTE — Progress Notes (Signed)
Patient ID: [name], male   DOB: August 15, 1950, 64 y.o.   MRN: [id number]  HPI: [name] is a 64 y.o.-year-old male, returning for f/u for DM1, dx 1964 (64 y/o), uncontrolled, with complications (CAD, proliferative diabetic retinopathy-history of retinal detachment OD, right carotid stenosis, cerebrovascular disease - s/p TIA 01/2014, PVD, peripheral neuropathy, ED). Last visit 3 mo ago.  Last hemoglobin A1c was: Lab Results  Component Value Date   HGBA1C 8.2* 05/07/2014   HGBA1C 9.5* 01/13/2014   HGBA1C 8.9* 11/12/2013   Pt is on an insulin pump: One Touch Ping (Anim[employer]) with NovoLog. He got a replacement pump b/c errors in the old pump.   At last visit, we made several changes in his pump settings: - basal rate:  MN: 0.55 2 am: 0.75 7 am: 1.1 11 am: 1.1 5 PM: 0.85 11pm 0.65 - ICR:  12 am: 15 6 am: 12 10 am 12 4 pm: 10 - ISF:   MN: 30   11 am: 25  9 pm: 25 >> 40 - target:  MN: 140 5:30 am: 110  10 pm: 120 >> 140   - IOB: 4 h  TDD bolus ave 45% (16.8 units) TDD basal ave 55% (20.4 units)  Sugars: 70% above target, 2% below target, 28% within target  Pt checks his sugars 4.1x a day and they are still high - ave 207+/-86 >> 229+/-71 over the last month. We downloaded his pump and his meter:  - am: [phone] >> 56-180, 236 >> 54, 87-265, 377 >> 112-266, 300, 343 >> 66-204 >> 56, 62-195, 258, 360 - 2h after b'fast:190, 441 >> n/c >> 110-315 >> 130-140 >> 202-367 - before lunch:170-311 >> 119-228, 326 >> 141-208 >> 52-183, 355 when sick >> 61, 96-294, 330 - 2h after lunch: 65 x1, 281, 410 x1 >> 197-301 >> n/c >> 99-212, 409 >> 128, 337 when sick >> 324, 325 - before dinner: 57x1 (over correction), 102-214, 352 >> 91-321, higher values when sick >> 77-330 - bedtime: 300-400s >> 112-450 >> 84-315 >> 125-391 >> 161-388 >> 139-333 >> 87, 116-282 >> 108-289 - nighttime: 66 >> 56, 190-407 >> 275, 172 >> 110, 193, 393 >> 110-190, 295 >> 261, 263 + few lows. Lowest sugar  was 40s (after physical exertion) >> 56; he has hypoglycemia awareness at 100. Highest: 439  Glucometer: [employer].  Pt's meals are: - Breakfast: 1.5 scramble eggs, toast, milk - 65 g carbs - Lunch: sandwich, shaved chicken or [place] breast, cheese, mayo, chips, soda - 100 carbs - Dinner: meat, vegetables, bread, fruit, milk - 100 g carbs - Snacks: 2-3 a day: bagel midmorning, evening snack: chips, popcorn  Pt does have mild chronic kidney disease, last BUN/creatinine was:  Lab Results  Component Value Date   BUN 16 01/17/2014   CREATININE 1.13 01/17/2014  Last microalbumin/creatinine ratio was 3.8.  Last set of lipids: Lab Results  Component Value Date   CHOL 298* 01/13/2014   HDL 66 01/13/2014   LDLCALC 211* 01/13/2014   LDLDIRECT 159.5 04/21/2011   TRIG 105 01/13/2014   CHOLHDL 4.5 01/13/2014  He is not on a statin, this is listed as an intolerance. Pt's last eye exam was in 02/28/2014 [facility] He has proliferative DR, s/p detached retina. He was advised to return in a year. Has numbness and tingling in his legs.  He also has a history of hyperlipidemia, disequilibrium, mild cognitive impairment, hypothyroidism-status post thyroidectomy for multinodular goiter. He also has OSA-on CPAP,  GERD.  I reviewed pt's medications, allergies, PMH, social hx, family hx, and changes were documented in the history of present illness. Otherwise, unchanged from my initial visit note.  ROS: Constitutional: no weight gain, no fatigue, no subjective hyperthermia/hypothermia Eyes: no blurry vision, no xerophthalmia ENT: no sore throat, no nodules palpated in throat, no dysphagia/odynophagia, no hoarseness Cardiovascular: no CP/SOB/palpitations/ leg swelling Respiratory: no cough/SOB Gastrointestinal: no N/V/D/C Musculoskeletal: no muscle aches/joint aches Skin: no rashes Neurological: no tremors/numbness/tingling/dizziness  PE: BP 132/68 mmHg  Pulse 78  Temp(Src) 97.8  F (36.6 C) (Oral)  Resp 12  Wt 215 lb 12.8 oz (97.886 kg)  SpO2 95% Body mass index is 27.7 kg/(m^2). Wt Readings from Last 3 Encounters:  06/06/14 220 lb (99.791 kg)  05/07/14 218 lb (98.884 kg)  05/01/14 218 lb (98.884 kg)  There is no weight on file to calculate BMI.  Constitutional: slightly overweight, in NAD Eyes: PERRLA, EOMI, no exophthalmos ENT: moist mucous membranes, no thyromegaly, no cervical lymphadenopathy Cardiovascular: RRR, + 2/6 SEM, no RG Respiratory: CTA B Gastrointestinal: abdomen soft, NT, ND, BS+ Musculoskeletal: no deformities, strength intact in all 4 Skin: moist, warm, no rashes  ASSESSMENT: 1. 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  Patient with long-standing type 1 diabetes, poorly controlled, with multiple complications, on an insulin pump, however without good understanding of the pump principles and settings.  - At last visit, I advised him to increase the insulin sensitivity factor to 40 and also his target blood sugar 140 at night. We also discussed about temporary basal rates which are needed if he stays active (start with a 10-20% decrease) or if he is sick (10-20% increase). He is using a reduced basal rate for when he is active outside.  - Sugars now high after b'fast and well into late pm: he sips sweet tea all day and does not always bolus with lunch >> advised him to do so.  Patient Instructions  Please change the pump settings as follows: - basal rate:  MN: 0.55 2 am: 0.75 7 am: 1.1 11 am: 1.1 5 PM: 0.85 11pm 0.65 - ICR:  12 am: 15 6 am: 12 >> 10 10 am 12 4 pm: 10 - ISF:   MN: 30   11 am: 25  9  pm: 40 - target:  MN: 140 5:30 am: 110  10 pm: 140   - IOB: 4 h   Please stop drinking sweet tea.  Please bolus for lunch every day.  Please come back for a follow-up appointment in 3 months.  - will check a hemoglobin A1c today >> 8.4% (slightly higher) - I will see him in 3 mo  - time spent with the patient: 40 min, of which >50% was spent in reviewing his pump downloads, discussing his hypo- and hyper-glycemic episodes, reviewing his previous labs and pump settings and developing a plan to avoid hypo- an hyper-glycemia.

## 2014-08-21 ENCOUNTER — Other Ambulatory Visit: Payer: Self-pay | Admitting: *Deleted

## 2014-08-21 ENCOUNTER — Encounter: Payer: Self-pay | Admitting: Internal Medicine

## 2014-08-21 MED ORDER — INSULIN LISPRO 100 UNIT/ML ~~LOC~~ SOLN: SUBCUTANEOUS | Status: DC

## 2014-08-21 NOTE — Telephone Encounter (Signed)
Opened encounter in error  

## 2014-08-22 ENCOUNTER — Telehealth: Payer: Self-pay | Admitting: Internal Medicine

## 2014-08-22 ENCOUNTER — Other Ambulatory Visit: Payer: Self-pay | Admitting: *Deleted

## 2014-08-25 ENCOUNTER — Encounter: Payer: Self-pay | Admitting: Internal Medicine

## 2014-08-26 ENCOUNTER — Telehealth: Payer: Self-pay | Admitting: Endocrinology

## 2014-08-26 NOTE — Telephone Encounter (Signed)
Pharmacy called stating that the wrong needles were called in   Rx: Syringe with needles for vials   Pharmacy: [facility]  Thank you

## 2014-08-26 NOTE — Telephone Encounter (Signed)
Which [facility]?

## 2014-08-27 ENCOUNTER — Other Ambulatory Visit: Payer: Self-pay | Admitting: *Deleted

## 2014-08-27 MED ORDER — INSULIN LISPRO 100 UNIT/ML ~~LOC~~ SOLN: SUBCUTANEOUS | Status: DC

## 2014-08-27 NOTE — Telephone Encounter (Signed)
Message via MyChart: Let's try for humalog since that is the way they are trying to make me do. Can you send a prescription to [facility] [address] for this? Ok to send per Dr Elvera Lennox.

## 2014-10-03 ENCOUNTER — Telehealth: Payer: Self-pay | Admitting: Internal Medicine

## 2014-10-03 MED ORDER — TADALAFIL 20 MG PO TABS: 20.0000 mg | ORAL_TABLET | Freq: Every day | ORAL | Status: DC | PRN

## 2014-10-03 NOTE — Telephone Encounter (Signed)
OK to call in

## 2014-10-03 NOTE — Telephone Encounter (Signed)
Pt needs cialis 20 mg called [facility] [place]

## 2014-10-03 NOTE — Telephone Encounter (Signed)
Sent in qty#10, 1 r/f.

## 2014-10-03 NOTE — Telephone Encounter (Signed)
Please read message below and advise.  

## 2014-10-09 ENCOUNTER — Ambulatory Visit (INDEPENDENT_AMBULATORY_CARE_PROVIDER_SITE_OTHER): Payer: PRIVATE HEALTH INSURANCE | Admitting: Family Medicine

## 2014-10-09 ENCOUNTER — Encounter: Payer: Self-pay | Admitting: Family Medicine

## 2014-10-09 VITALS — BP 135/75 | HR 77 | Temp 98.2°F | Resp 16 | Ht 74.0 in | Wt 216.0 lb

## 2014-10-09 DIAGNOSIS — E039 Hypothyroidism, unspecified: Secondary | ICD-10-CM | POA: Diagnosis not present

## 2014-10-09 DIAGNOSIS — I6521 Occlusion and stenosis of right carotid artery: Secondary | ICD-10-CM | POA: Diagnosis not present

## 2014-10-09 DIAGNOSIS — Z1211 Encounter for screening for malignant neoplasm of colon: Secondary | ICD-10-CM | POA: Diagnosis not present

## 2014-10-09 DIAGNOSIS — I639 Cerebral infarction, unspecified: Secondary | ICD-10-CM

## 2014-10-09 DIAGNOSIS — E785 Hyperlipidemia, unspecified: Secondary | ICD-10-CM | POA: Diagnosis not present

## 2014-10-09 LAB — LIPID PANEL
CHOLESTEROL: 244 mg/dL — AB (ref 0–200)
HDL: 64.2 mg/dL (ref >39.00)
LDL CALC: 162 mg/dL — AB (ref 0–99)
NonHDL: 179.69
Total CHOL/HDL Ratio: 4
Triglycerides: 86 mg/dL (ref 0.0–149.0)
VLDL: 17.2 mg/dL (ref 0.0–40.0)

## 2014-10-09 LAB — TSH: TSH: 4.22 u[IU]/mL (ref 0.35–4.50)

## 2014-10-09 NOTE — Progress Notes (Signed)
OFFICE VISIT  10/09/2014   CC:  Chief Complaint  Patient presents with  . Follow-up    Pt is fasting.    HPI:    Patient is a 64 y.o. Caucasian male who presents for 6 mo f/u HLD, hypothyroidism, relatively recent R carotid stent placed 01/2014--on DAPT.  His diabetes is managed by Dr. Elvera Lennox with endo.  TSH was up slightly 04/2014 and he was taking his levothyroxine incorrectly so we had him start taking it correctly and he is due for recheck TSH.  He saw GI since I last saw him and it was decided he would get a colonoscopy for colon cancer screening but this has been put off for a while due to his wife having an upcoming major surgery.  No acute complaints today. Feet: no ulcers.    Past Medical History  Diagnosis Date  . Diabetes mellitus Dx'd age 85    Novolog via insulin pump; sub-optimal control x years  . High cholesterol     Takes 1/2 of  atorvastatin daily  . Hypothyroidism     Thyroidectomy 2002; multinodular goiter  . Erectile dysfunction     Se[place]rologist in [place]  . Diabetic retinopathy     Proliferative: Hx of retinal detachment on right-light perception only in right eye  . History of tobacco abuse     Quit about 1990 (has 35 pack-yr hx)  . OSA (obstructive sleep apnea)     06/2011 sleep study: moderate OSA, CPAP at 12 cm H2O.  . Impaired vision     right eye light perception only; hx of retinal detachment.  . Recurrent pneumonia   . GERD (gastroesophageal reflux disease)   . Venous insufficiency   . Diastolic dysfunction 2007    TEE with diastolic dysfunction, EF 74%.  . Diabetic neuropathy (HCC)     fine touch and position sense affected  . Aortic stenosis, mild Sept/Oct /2013    Mild/mod by echo; mild by cath 10/28/11.  Mild progression by echo 05/2012--f/u echo annually recommended (patient asymptomatic).  . Carotid stenosis, right 09/2011    50-70% stenosis, stable on repeat dopplers 05/2012 but left sided velocities increased 01/2013. Dr. [name]  suggests f/u doppler in 6 mo.  . 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  Med mgmt recommended.  . Abnormal stress test 10/27/2011    lat isch--cardiac cath 10/28/2011  . Venous reflux 10/14/2011    venous doppler-R GSV continuous reflux throughout; too small for VNUS closure  . Decreased pedal pulses     LE doppler 11/08/11- no evidence of arterial insufficiency  . TIA (transient ischemic attack) 2015/16    with R carotid dz: pt got R carotid stent 01/2014 by Dr. [name] and was put on dual antiplatelet therapy with ASA  and plavix  qd afterwards    Past Surgical History  Procedure Laterality Date  . Thyroid surgery  2002  . Lasix eye    . Eye surgery      Multiple laser surgeries for diabetic retinopathy, also cataract surgery [facility].  [name] Retinal detachment surgery      Right eye  . Cataract extraction      left  . Transthoracic echocardiogram  10/2011;12/2012    Mod AS, mild LVH, EF 60-65%, no RWMA-being followed by Dr. [name]  . Cardiac catheterization  10/2011    EF normal.  Diffuse 3 vessel CAD,  no stents placed.  Medical mgmt per [facility].  [name] Left and right heart catheterization with coronary angiogram N/A 10/28/2011    Procedure: LEFT AND R0IHT HEART CATHETERIZATION WITH CORONARY ANGIOGRAM;  Surgeon: Lennette Bihari, MD;  Location: [facility] CATH LAB;  Service: Cardiovascular;  Laterality: N/A;  . Carotid stent insertion Right 01/16/2014    Procedure: CAROTID STENT INSERTION;  Surgeon: Runell Gess, MD;  Location: [facility] CATH LAB;  Service: Cardiovascular;  Laterality: Right;    Outpatient Prescriptions Prior to Visit  Medication Sig Dispense Refill  . albuterol (VENTOLIN HFA) 108 (90 BASE) MCG/ACT inhaler Inhale 1-2 puffs into the lungs every 4 (four) hours as needed for wheezing or shortness of breath. 1 Inhaler 0  . aspirin 81 MG tablet Take 81 mg by mouth daily.    .  clopidogrel (PLAVIX) 75 MG tablet Take 1 tablet (75 mg total) by mouth daily. 90 tablet 1  . clotrimazole-betamethasone (LOTRISONE) cream Apply to affected area twice daily as needed for rash (Patient taking differently: Apply 1 application topically 2 (two) times daily as needed (rash). ) 15 g 2  . Feverfew 380 MG CAPS Take 380 mg by mouth daily. For migraine headaches    . gabapentin (NEURONTIN) 300 MG capsule 1 tab po tid 90 capsule 6  . Insulin Human (INSULIN PUMP) SOLN Inject into the skin continuous. Basal rate .95/hr, current pump settings: 12am .45, 2am .65, 7am .95, 11am .95, 5pm .75, 11pm .5    . insulin lispro (HUMALOG) 100 UNIT/ML injection PUMP PARAMETERS: BASAL RATE 0.8 U/H, MEAL: 1:11, 1:12, 1:15 BF, L, D, 1 U/75 ABOVE 130; approx 60 units daily in insulin pump. 60 mL 2  . levothyroxine (SYNTHROID, LEVOTHROID) 137 MCG tablet Take 1 tablet (137 mcg total) by mouth daily. 90 tablet 1  . Omega-3 Fatty Acids (FISH OIL) 1000 MG CAPS Take 1,000 mg by mouth daily.    . ranitidine (ZANTAC) 150 MG tablet Take 150 mg by mouth daily as needed for heartburn.    . tadalafil (CIALIS) 20 MG tablet Take 1 tablet (20 mg total) by mouth daily as needed for erectile dysfunction. 10 tablet 1  . HYDROcodone-homatropine (HYCODAN) 5-1.5 MG/5ML syrup 1-2 tsp po bid prn (Patient not taking: Reported on 10/09/2014) 180 mL 0  . insulin aspart (NOVOLOG) 100 UNIT/ML injection PUMP PARAMETERS: BASAL RATE 0.8 U/H, MEAL: 1:11, 1:12, 1:15 BF, L, D, 1 U/75 ABOVE 130; approx 60 units daily in insulin pump. (Patient not taking: Reported on 10/09/2014) 60 mL 1   No facility-administered medications prior to visit.    Allergies  Allergen Reactions  . Statins Other (See Comments)    Problem with higher dosages (Lipitor caused memory issues), also caused leg pains and lethargy  . Tape Rash    Adhesive tape.  (Paper tape OK)    ROS As per HPI  PE: Blood pressure 135/75, pulse 77, temperature 98.2 F (36.8 C),  temperature source Oral, resp. rate 16, height  (1.88 m), weight 216 lb (97.977 kg), SpO2 95 %. Gen: Alert, well appearing.  Patient is oriented to person, place, time, and situation. CV: 2/6 systolic murmur heard best at cardiac base, RRR, no  Rub or gallop. Chest is clear, no wheezing or rales. Normal symmetric air entry throughout both lung fields. No chest wall deformities or tenderness. EXT: 2+ pitting edema in LL's, R>L.  No cyanosis or clubbing. Feet: no rash or skin breakdown or calluses.  LABS:  Lab Results  Component  Value Date   HGBA1C 8.4 08/13/2014   Lab Results  Component Value Date   TSH 5.94* 04/09/2014   Lab Results  Component Value Date   CHOL 298* 01/13/2014   HDL 66 01/13/2014   LDLCALC 211* 01/13/2014   LDLDIRECT 159.5 04/21/2011   TRIG 105 01/13/2014   CHOLHDL 4.5 01/13/2014     Chemistry      Component Value Date/Time   NA 137 01/17/2014 0308   K 4.2 01/17/2014 0308   CL 103 01/17/2014 0308   CO2 28 01/17/2014 0308   BUN 16 01/17/2014 0308   CREATININE 1.13 01/17/2014 0308   CREATININE 1.04 05/13/2013 0802      Component Value Date/Time   CALCIUM 8.0* 01/17/2014 0308   ALKPHOS 81 01/12/2014 1625   AST 23 01/12/2014 1625   ALT 18 01/12/2014 1625   BILITOT 0.5 01/12/2014 1625     Lab Results  Component Value Date   WBC 7.0 01/17/2014   HGB 12.7* 01/17/2014   HCT 36.9* 01/17/2014   MCV 87.9 01/17/2014   PLT 171 01/17/2014   IMPRESSION AND PLAN:  1) Hyperlipidemia: intolerant of statins and zetia.  After discussion of PCSK9 inhibitor possibility with his cardiologist pt decided this was too expensive.  He has not done a remarkable amount of TLC since his last lipid panel 01/2014.  Check FLP today and CMET.  2) Hypothyroidism: due for recheck TSH today.  3) CAD + carotid artery dz: s/p R carotid stent 01/2014.  Dr. [name] following. Continue ASA and plavix.  4) Colon ca screening: has plans for colonoscopy but will have to schedule  this.  5) DM : as per endo.  He declined flu vaccine today.  An After Visit Summary was printed and given to the patient.  FOLLOW UP: Return in about 6 months (around 04/09/2015) for AWV.

## 2014-10-09 NOTE — Progress Notes (Signed)
Pre visit review using our clinic review tool, if applicable. No additional management support is needed unless otherwise documented below in the visit note. 

## 2014-11-20 ENCOUNTER — Ambulatory Visit: Payer: Self-pay | Admitting: Internal Medicine

## 2014-12-05 ENCOUNTER — Telehealth: Payer: Self-pay | Admitting: *Deleted

## 2014-12-05 NOTE — Telephone Encounter (Signed)
[facility]. I advised pt that we have not received fax but I will contact [facility] and see if they can refax form. [facility] [place] [place] [facility] and [facility], form was signed and faxed back. Tried calling pt NA and unable to leave a message.

## 2014-12-15 ENCOUNTER — Other Ambulatory Visit: Payer: Self-pay | Admitting: Cardiovascular Disease

## 2014-12-15 ENCOUNTER — Other Ambulatory Visit: Payer: Self-pay | Admitting: Cardiology

## 2014-12-15 DIAGNOSIS — I6523 Occlusion and stenosis of bilateral carotid arteries: Secondary | ICD-10-CM

## 2014-12-18 ENCOUNTER — Telehealth: Payer: Self-pay | Admitting: *Deleted

## 2014-12-18 NOTE — Telephone Encounter (Signed)
If he has not tried Mucinex DM or robitussin DM then he should try one of these.-thx

## 2014-12-18 NOTE — Telephone Encounter (Signed)
Pt LMOM on 12/18/14 at 10:40am stating that he did a TeleDoc OV a few days ago. He stated that he advised them to send us the records but I did not see any in his chart. He stated that they told him he had a viral cold and that he could take some otc meds along with Rx Benzonatate 200mg  cap take 1 TID for cough. He stated that the benzonatate is not helping his cough. I advised him that he may need an ov do have any further medications sent in. Pt stated that he could not make it in for an ov and would just have to deal the the symptoms if nothing else is going to be sent in. Please advise. Thanks.

## 2014-12-18 NOTE — Telephone Encounter (Signed)
Pt stated that he has been taking Mucinex D but will try one of the medications recommended below.

## 2014-12-22 ENCOUNTER — Encounter: Payer: Self-pay | Admitting: Cardiovascular Disease

## 2014-12-22 ENCOUNTER — Ambulatory Visit (INDEPENDENT_AMBULATORY_CARE_PROVIDER_SITE_OTHER): Payer: PRIVATE HEALTH INSURANCE | Admitting: Cardiovascular Disease

## 2014-12-22 VITALS — BP 140/64 | HR 80 | Ht 74.0 in | Wt 220.0 lb

## 2014-12-22 DIAGNOSIS — I10 Essential (primary) hypertension: Secondary | ICD-10-CM | POA: Diagnosis not present

## 2014-12-22 DIAGNOSIS — I350 Nonrheumatic aortic (valve) stenosis: Secondary | ICD-10-CM | POA: Diagnosis not present

## 2014-12-22 DIAGNOSIS — I6521 Occlusion and stenosis of right carotid artery: Secondary | ICD-10-CM | POA: Diagnosis not present

## 2014-12-22 NOTE — Assessment & Plan Note (Signed)
History of moderate aortic stenosis by 2-D echo most recently performed 01/07/14. He had normal LV systolic function with a valve area 0.9 cm2 and a peak gradient of 35 mmHg. We will recheck a 2-D echocardiogram

## 2014-12-22 NOTE — Assessment & Plan Note (Signed)
History of high-grade right ICA stenosis status post right carotid artery stenting by myself 01/16/14. Carotid Dopplers performed 2 weeks after that revealed a widely patent carotid artery. We'll continue to follow him by duplex ultrasound on an annual basis

## 2014-12-22 NOTE — Assessment & Plan Note (Signed)
History of hyperlipidemia intolerant to statin therapy with his most recent lipid profile performed 10/09/58 referred to the hospital to 44, LDL 162 and HDL of 64. He is statin intolerant. We will explore the use of PC SK9 monoclonal injectables

## 2014-12-22 NOTE — Assessment & Plan Note (Signed)
History of CAD status post cardiac catheterization performed by Dr. Tresa EndoKelly revealing moderate three-vessel disease with preserved LV function. Medical therapy was recommended. He denies chest pain or shortness of breath.

## 2014-12-22 NOTE — Assessment & Plan Note (Signed)
History of hypertension blood pressure measured at 140/64. He is on no antihypertensive medications.

## 2014-12-22 NOTE — Progress Notes (Signed)
12/22/2014 [name]   October 24, 1950  [id number]  Primary Physician [name], MD Primary Cardiologist: Runell Gess MD [name]   HPI:  .[name] is a 64 year old mildly overweight married Caucasian male, father of 3 and grandfather to 3 grandchildren, I last saw at the time of his carotid stent 01/16/14.[name] He has a history of insulin-dependent diabetes on insulin pump, mild sleep apnea, and hyperlipidemia as well as mild aortic stenosis. He had a Myoview stress test that was abnormal which led to a heart catheterization performed by Dr. Tresa Endo in my absence revealing 70% disease in all 3 vessels with preserved LV function and mild AS. He also had moderate right ICA stenosis. He was neurologically asymptomatic. He was placed on beta-blocker and Ranexa Carotid Dopplers showed stable moderate right ICA stenosis. He was admitted to [facility] with 2 sequential TIAs. Carotid Dopplers did show moderate right ICA stenosis. The patient was seen by Dr. Pearlean Brownie, stroke neurologist, asked Dr. Imogene Burn, vascular surgeon for consultation. Dr. Imogene Burn ordered a CTA which showed high-grade ostial right internal carotid artery stenosis. I am asked to see the patient for consideration of carotid artery stenting. The patient was offered carotid endarterectomy. [name] apparently had a bad experience remotely being intubated at the time of the parathyroid operation and does not want to be reintubated again if at all possible making carotid stenting the best option for revascularization. I perform this on 01/16/14 successfully without complication. His follow-up Doppler 2 weeks later showed his carotid artery widely patent. Since I saw him last almost a year ago he denies chest pain or shortness of breath.  Current Outpatient Prescriptions  Medication Sig Dispense Refill  . albuterol (VENTOLIN HFA) 108 (90 BASE) MCG/ACT inhaler Inhale 1-2 puffs into the lungs every 4 (four) hours as  needed for wheezing or shortness of breath. 1 Inhaler 0  . aspirin 81 MG tablet Take 81 mg by mouth daily.    . clopidogrel (PLAVIX) 75 MG tablet Take 1 tablet (75 mg total) by mouth daily. 90 tablet 1  . clotrimazole-betamethasone (LOTRISONE) cream Apply 1 application topically daily as needed (RASH).    . Feverfew 380 MG CAPS Take 380 mg by mouth daily. For migraine headaches    . gabapentin (NEURONTIN) 300 MG capsule 1 tab po tid 90 capsule 6  . Insulin Human (INSULIN PUMP) SOLN Inject into the skin continuous. Basal rate .95/hr, current pump settings: 12am .45, 2am .65, 7am .95, 11am .95, 5pm .75, 11pm .5    . insulin lispro (HUMALOG) 100 UNIT/ML injection PUMP PARAMETERS: BASAL RATE 0.8 U/H, MEAL: 1:11, 1:12, 1:15 BF, L, D, 1 U/75 ABOVE 130; approx 60 units daily in insulin pump. 60 mL 2  . levothyroxine (SYNTHROID, LEVOTHROID) 137 MCG tablet Take 1 tablet (137 mcg total) by mouth daily. 90 tablet 1  . Omega-3 Fatty Acids (FISH OIL) 1000 MG CAPS Take 1,000 mg by mouth daily.    . ranitidine (ZANTAC) 150 MG tablet Take 150 mg by mouth daily as needed for heartburn.    . tadalafil (CIALIS) 20 MG tablet Take 1 tablet (20 mg total) by mouth daily as needed for erectile dysfunction. 10 tablet 1   No current facility-administered medications for this visit.    Allergies  Allergen Reactions  . Statins Other (See Comments)    Problem with higher dosages (Lipitor caused memory issues), also caused leg pains and lethargy  . Tape Rash    Adhesive tape.  (  Paper tape OK)    Social History   Social History  . Marital Status: Married    Spouse Name: N/A  . Number of Children: N/A  . Years of Education: N/A   Occupational History  . Not on file.   Social History Main Topics  . Smoking status: Former Games developermoker  . Smokeless tobacco: Former NeurosurgeonUser    Quit date: 06/22/1987  . Alcohol Use: 0.0 oz/week    0 Standard drinks or equivalent per week  . Drug Use: No  . Sexual Activity: Not on file    Other Topics Concern  . Not on file   Social History Narrative   Divorced x1, remarried.  Has 3 daughters from first marriage.   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   Distant tobacco abuse (35 pack-yr hx), quit in the late 1990s, no ETOH or drugs.   No exercise.     Review of Systems: General: negative for chills, fever, night sweats or weight changes.  Cardiovascular: negative for chest pain, dyspnea on exertion, edema, orthopnea, palpitations, paroxysmal nocturnal dyspnea or shortness of breath Dermatological: negative for rash Respiratory: negative for cough or wheezing Urologic: negative for hematuria Abdominal: negative for nausea, vomiting, diarrhea, bright red blood per rectum, melena, or hematemesis Neurologic: negative for visual changes, syncope, or dizziness All other systems reviewed and are otherwise negative except as noted above.    Blood pressure 140/64, pulse 80, height 6\' 2"  (1.88 m), weight 220 lb (99.791 kg).  General appearance: alert and no distress Neck: no adenopathy, no JVD, supple, symmetrical, trachea midline, thyroid not enlarged, symmetric, no tenderness/mass/nodules and bbilateral carotid bruits versus a transmitted murmur Lungs: clear to auscultation bilaterally Heart: 2/6 outflow tract murmur consistent with aortic stenosis Extremities: extremities normal, atraumatic, no cyanosis or edema  EKG not performed today  ASSESSMENT AND PLAN:   Hyperlipidemia History of hyperlipidemia intolerant to statin therapy with his most recent lipid profile performed 10/09/58 referred to the hospital to 42, LDL 162 and HDL of 64. He is statin intolerant. We will explore the use of PC SK9 monoclonal injectables  Essential hypertension History of hypertension blood pressure measured at 140/64. He is on no antihypertensive medications.  Carotid  stenosis History of high-grade right ICA stenosis status post right carotid artery stenting by myself 01/16/14. Carotid Dopplers performed 2 weeks after that revealed a widely patent carotid artery. We'll continue to follow him by duplex ultrasound on an annual basis  CAD (coronary artery disease) History of CAD status post cardiac catheterization performed by Dr. Tresa EndoKelly revealing moderate three-vessel disease with preserved LV function. Medical therapy was recommended. He denies chest pain or shortness of breath.  Aortic stenosis, moderate History of moderate aortic stenosis by 2-D echo most recently performed 01/07/14. He had normal LV systolic function with a valve area 0.9 cm2 and a peak gradient of 35 mmHg. We will recheck a 2-D echocardiogram      Runell GessJonathan J. Haroldine Redler MD [facility] Regional Medical CenterFACP,FACC,FAHA, Indian[facility] Med CtrFSCAI 12/22/2014 3:54 PM

## 2014-12-22 NOTE — Patient Instructions (Addendum)
Medication Instructions:  Your physician recommends that you continue on your current medications as directed. Please refer to the Current Medication list given to you today.  Labwork: Your physician recommends that you return for lab work in: second week in January, Lipid and Liver panel.   Testing/Procedures: Your physician has requested that you have a carotid duplex before the end of 2016. This test is an ultrasound of the carotid arteries in your neck. It looks at blood flow through these arteries that supply the brain with blood. Allow one hour for this exam. There are no restrictions or special instructions.  Your physician has requested that you have an echocardiogram before the end of 2016. Echocardiography is a painless test that uses sound waves to create images of your heart. It provides your doctor with information about the size and shape of your heart and how well your heart's chambers and valves are working. This procedure takes approximately one hour. There are no restrictions for this procedure.  Your physician has requested that you have a lower  arterial duplex before the end of 2016. This test is an ultrasound of the arteries in the legs. It looks at arterial blood flow in the legs and arms. Allow one hour for Lower  Arterial scans. There are no restrictions or special instructions  Follow-Up: We request that you follow-up in:  12 months with Dr San MorelleBerry You will receive a reminder letter in the mail two months in advance. If you don't receive a letter, please call our office to schedule the follow-up appointment.  Your physician recommends that you schedule a follow-up appointment in: Third week in January with Belenda CruiseKristin the Pharmacist    If you need a refill on your cardiac medications before your next appointment, please call your pharmacy.

## 2015-01-01 ENCOUNTER — Other Ambulatory Visit: Payer: Self-pay | Admitting: Cardiovascular Disease

## 2015-01-01 DIAGNOSIS — I739 Peripheral vascular disease, unspecified: Secondary | ICD-10-CM

## 2015-01-02 ENCOUNTER — Encounter (HOSPITAL_COMMUNITY): Payer: Self-pay

## 2015-01-02 ENCOUNTER — Inpatient Hospital Stay (HOSPITAL_COMMUNITY): Admission: RE | Admit: 2015-01-02 | Payer: Self-pay | Source: Ambulatory Visit

## 2015-01-13 ENCOUNTER — Ambulatory Visit (HOSPITAL_COMMUNITY)
Admission: RE | Admit: 2015-01-13 | Discharge: 2015-01-13 | Disposition: A | Discharge status: Home / Self Care | Payer: PRIVATE HEALTH INSURANCE | Source: Ambulatory Visit | Attending: Cardiology | Admitting: Cardiology

## 2015-01-13 ENCOUNTER — Ambulatory Visit (HOSPITAL_BASED_OUTPATIENT_CLINIC_OR_DEPARTMENT_OTHER)
Admission: RE | Admit: 2015-01-13 | Discharge: 2015-01-13 | Disposition: A | Discharge status: Home / Self Care | Payer: PRIVATE HEALTH INSURANCE | Source: Ambulatory Visit | Attending: Cardiovascular Disease | Admitting: Cardiovascular Disease

## 2015-01-13 ENCOUNTER — Ambulatory Visit (HOSPITAL_COMMUNITY)
Admission: RE | Admit: 2015-01-13 | Discharge: 2015-01-13 | Disposition: A | Discharge status: Home / Self Care | Payer: PRIVATE HEALTH INSURANCE | Source: Ambulatory Visit | Attending: Cardiovascular Disease | Admitting: Cardiovascular Disease

## 2015-01-13 ENCOUNTER — Telehealth: Payer: Self-pay

## 2015-01-13 DIAGNOSIS — E7800 Pure hypercholesterolemia, unspecified: Secondary | ICD-10-CM | POA: Insufficient documentation

## 2015-01-13 DIAGNOSIS — Z87891 Personal history of nicotine dependence: Secondary | ICD-10-CM | POA: Insufficient documentation

## 2015-01-13 DIAGNOSIS — I6523 Occlusion and stenosis of bilateral carotid arteries: Secondary | ICD-10-CM | POA: Diagnosis not present

## 2015-01-13 DIAGNOSIS — I10 Essential (primary) hypertension: Secondary | ICD-10-CM

## 2015-01-13 DIAGNOSIS — R938 Abnormal findings on diagnostic imaging of other specified body structures: Secondary | ICD-10-CM | POA: Diagnosis not present

## 2015-01-13 DIAGNOSIS — E785 Hyperlipidemia, unspecified: Secondary | ICD-10-CM | POA: Insufficient documentation

## 2015-01-13 DIAGNOSIS — I350 Nonrheumatic aortic (valve) stenosis: Secondary | ICD-10-CM

## 2015-01-13 DIAGNOSIS — E1140 Type 2 diabetes mellitus with diabetic neuropathy, unspecified: Secondary | ICD-10-CM | POA: Insufficient documentation

## 2015-01-13 DIAGNOSIS — I70202 Unspecified atherosclerosis of native arteries of extremities, left leg: Secondary | ICD-10-CM | POA: Insufficient documentation

## 2015-01-13 DIAGNOSIS — E113599 Type 2 diabetes mellitus with proliferative diabetic retinopathy without macular edema, unspecified eye: Secondary | ICD-10-CM | POA: Diagnosis not present

## 2015-01-13 DIAGNOSIS — I517 Cardiomegaly: Secondary | ICD-10-CM | POA: Insufficient documentation

## 2015-01-13 DIAGNOSIS — E119 Type 2 diabetes mellitus without complications: Secondary | ICD-10-CM | POA: Insufficient documentation

## 2015-01-13 DIAGNOSIS — I739 Peripheral vascular disease, unspecified: Secondary | ICD-10-CM | POA: Diagnosis not present

## 2015-01-13 NOTE — Telephone Encounter (Signed)
-----   Message from Runell GessJonathan J Berry, MD sent at 01/13/2015  2:22 PM EST ----- Mild progression of AS. If asymptomatic repeat 12 months

## 2015-01-14 ENCOUNTER — Telehealth: Payer: Self-pay

## 2015-01-14 DIAGNOSIS — I6521 Occlusion and stenosis of right carotid artery: Secondary | ICD-10-CM

## 2015-01-14 DIAGNOSIS — I739 Peripheral vascular disease, unspecified: Secondary | ICD-10-CM

## 2015-01-14 NOTE — Telephone Encounter (Signed)
-----   Message from Runell GessJonathan J Berry, MD sent at 01/14/2015  3:45 PM EST ----- No change from prior study. Repeat in 12 months.

## 2015-01-21 ENCOUNTER — Ambulatory Visit: Payer: Self-pay | Admitting: Internal Medicine

## 2015-02-02 ENCOUNTER — Encounter: Payer: Self-pay | Admitting: Internal Medicine

## 2015-02-03 ENCOUNTER — Telehealth: Payer: Self-pay | Admitting: Internal Medicine

## 2015-02-03 MED ORDER — INSULIN LISPRO 100 UNIT/ML ~~LOC~~ SOLN: SUBCUTANEOUS | Status: DC

## 2015-02-03 NOTE — Telephone Encounter (Signed)
Please advise if ok to change from Novolog to Humalog. Thank you. Form faxed to [facility] yesterday.

## 2015-02-03 NOTE — Telephone Encounter (Signed)
OK 

## 2015-02-03 NOTE — Telephone Encounter (Signed)
Patient stated that he need the Novalog to changed to Humalog [facility] [address]. He also need prescriptions for [employer] quick set infusion set 43 inch 9mm 3 boxes for three months, Insulin Human (INSULIN PUMP) SOLN, send to [facility] [phone]

## 2015-02-20 ENCOUNTER — Encounter: Payer: Self-pay | Admitting: Family Medicine

## 2015-02-20 ENCOUNTER — Ambulatory Visit (INDEPENDENT_AMBULATORY_CARE_PROVIDER_SITE_OTHER): Payer: PRIVATE HEALTH INSURANCE | Admitting: Family Medicine

## 2015-02-20 VITALS — BP 125/66 | HR 104 | Temp 99.8°F | Resp 16 | Ht 74.0 in | Wt 219.8 lb

## 2015-02-20 DIAGNOSIS — J111 Influenza due to unidentified influenza virus with other respiratory manifestations: Secondary | ICD-10-CM

## 2015-02-20 DIAGNOSIS — J1189 Influenza due to unidentified influenza virus with other manifestations: Secondary | ICD-10-CM | POA: Diagnosis not present

## 2015-02-20 DIAGNOSIS — I6523 Occlusion and stenosis of bilateral carotid arteries: Secondary | ICD-10-CM

## 2015-02-20 LAB — POCT INFLUENZA A/B
Influenza A, POC: POSITIVE — AB
Influenza B, POC: POSITIVE — AB

## 2015-02-20 MED ORDER — OSELTAMIVIR PHOSPHATE 75 MG PO CAPS: 75.0000 mg | ORAL_CAPSULE | Freq: Two times a day (BID) | ORAL | Status: DC

## 2015-02-20 MED ORDER — HYDROCODONE-HOMATROPINE 5-1.5 MG/5ML PO SYRP: ORAL_SOLUTION | ORAL | Status: DC

## 2015-02-20 NOTE — Progress Notes (Signed)
Pre visit review using our clinic review tool, if applicable. No additional management support is needed unless otherwise documented below in the visit note. 

## 2015-02-20 NOTE — Progress Notes (Signed)
OFFICE VISIT  02/20/2015   CC:  Chief Complaint  Patient presents with  . URI    x 2 days   HPI:    Patient is a 65 y.o. Caucasian male who presents for respiratory complaints. Onset 2 d/a, nasal congestion/drainage, some chills x first 2 days, quite a bit of cough, very tired/fatigued. Poor appetite.  No n/v/d.  +ST and HA as well.  Taking excedrin, mucinex  Dm and sudafed OTC. Took proair some and this helped a little.  No wheezing or SOB or chest tightness. He has not had flu vaccine this season b/c he declined it.  Past Medical History  Diagnosis Date  . Diabetes mellitus Dx'd age 52    Novolog via insulin pump; sub-optimal control x years  . High cholesterol     Takes 1/2 of  atorvastatin daily  . Hypothyroidism     Thyroidectomy 2002; multinodular goiter  . Erectile dysfunction     [place]  . Diabetic retinopathy     Proliferative: Hx of retinal detachment on right-light perception only in right eye  . History of tobacco abuse     Quit about 1990 (has 35 pack-yr hx)  . OSA (obstructive sleep apnea)     06/2011 sleep study: moderate OSA, CPAP at 12 cm H2O.  . Impaired vision     right eye light perception only; hx of retinal detachment.  . Recurrent pneumonia   . GERD (gastroesophageal reflux disease)   . Venous insufficiency   . Diastolic dysfunction 2007; 2016    TEE with diastolic dysfunction, EF 74%.  . Diabetic neuropathy (HCC)     fine touch and position sense affected  . Aortic stenosis, moderate Sept/Oct /2013    Mild/mod by echo; mild by cath 10/28/11.  Mild progression by echo 05/2012.  Further mild progression on 01/2014 echo (valve area 0.93 cm2)  . Carotid stenosis, right 09/2011    Dr. [name] did R carotid stenting 01/2014  . 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  Med mgmt recommended.  . Abnormal stress test 10/27/2011    lat  isch--cardiac cath 10/28/2011  . Venous reflux 10/14/2011    venous doppler-R GSV continuous reflux throughout; too small for VNUS closure  . Decreased pedal pulses     LE doppler 11/08/11- no evidence of arterial insufficiency  . TIA (transient ischemic attack) 2015/16    with R carotid dz: pt got R carotid stent 01/2014 by Dr. [name] and was put on dual antiplatelet therapy with ASA  and plavix  qd afterwards    Past Surgical History  Procedure Laterality Date  . Thyroid surgery  2002  . Lasix eye    . Eye surgery      Multiple laser surgeries for diabetic retinopathy, also cataract surgery [facility].  [name] Retinal detachment surgery      Right eye  . Cataract extraction      left  . Transthoracic echocardiogram  10/2011;12/2012    Mod AS, mild LVH, EF 60-65%, no RWMA-being followed by Dr. [name]  . Cardiac catheterization  10/2011    EF normal.  Diffuse 3 vessel CAD, no stents placed.  Medical mgmt per [facility].  [name] Left and right heart catheterization with coronary angiogram N/A 10/28/2011    Procedure: LEFT AND R0IHT HEART CATHETERIZATION WITH CORONARY ANGIOGRAM;  Surgeon: Lennette Bihari, MD;  Location:  [facility] CATH LAB;  Service: Cardiovascular;  Laterality: N/A;  . Carotid stent insertion Right 01/16/2014    Procedure: CAROTID STENT INSERTION;  Surgeon: Runell Gess, MD;  Location: [facility] CATH LAB;  Service: Cardiovascular;  Laterality: Right;    Outpatient Prescriptions Prior to Visit  Medication Sig Dispense Refill  . albuterol (VENTOLIN HFA) 108 (90 BASE) MCG/ACT inhaler Inhale 1-2 puffs into the lungs every 4 (four) hours as needed for wheezing or shortness of breath. 1 Inhaler 0  . aspirin 81 MG tablet Take 81 mg by mouth daily.    . clopidogrel (PLAVIX) 75 MG tablet Take 1 tablet (75 mg total) by mouth daily. 90 tablet 1  . clotrimazole-betamethasone (LOTRISONE) cream Apply 1 application topically daily as needed (RASH).    . Feverfew 380 MG CAPS Take 380 mg by mouth daily. For  migraine headaches    . gabapentin (NEURONTIN) 300 MG capsule 1 tab po tid 90 capsule 6  . Insulin Human (INSULIN PUMP) SOLN Inject into the skin continuous. Basal rate .95/hr, current pump settings: 12am .45, 2am .65, 7am .95, 11am .95, 5pm .75, 11pm .5    . insulin lispro (HUMALOG) 100 UNIT/ML injection PUMP PARAMETERS: BASAL RATE 0.8 U/H, MEAL: 1:11, 1:12, 1:15 BF, L, D, 1 U/75 ABOVE 130; approx 60 units daily in insulin pump. 60 mL 2  . levothyroxine (SYNTHROID, LEVOTHROID) 137 MCG tablet Take 1 tablet (137 mcg total) by mouth daily. 90 tablet 1  . Omega-3 Fatty Acids (FISH OIL) 1000 MG CAPS Take 1,000 mg by mouth daily.    . ranitidine (ZANTAC) 150 MG tablet Take 150 mg by mouth daily as needed for heartburn.    . tadalafil (CIALIS) 20 MG tablet Take 1 tablet (20 mg total) by mouth daily as needed for erectile dysfunction. 10 tablet 1   No facility-administered medications prior to visit.    Allergies  Allergen Reactions  . Statins Other (See Comments)    Problem with higher dosages (Lipitor caused memory issues), also caused leg pains and lethargy  . Tape Rash    Adhesive tape.  (Paper tape OK)    ROS As per HPI  PE: Blood pressure 125/66, pulse 104, temperature 99.8 F (37.7 C), temperature source Oral, resp. rate 16, height  (1.88 m), weight 219 lb 12 oz (99.678 kg), SpO2 93 %. VS: noted--normal. Gen: alert, NAD, NONTOXIC APPEARING. HEENT: eyes without injection, drainage, or swelling.  Ears: EACs clear, TMs with normal light reflex and landmarks.  Nose: Clear rhinorrhea, with some dried, crusty exudate adherent to mildly injected mucosa.  No purulent d/c.  No paranasal sinus TTP.  No facial swelling.  Throat and mouth without focal lesion.  No pharyngial swelling, erythema, or exudate.   Neck: supple, no LAD.   LUNGS: CTA bilat, nonlabored resps.   CV: RRR, 2/6 syst murm, no r/g. EXT: no c/c/e SKIN: no rash  LABS:  Flu test today: positive (nonspecific--could be type  A or B)  IMPRESSION AND PLAN:  Influenza + respiratory illness.  Start tamiflu  bid x 5d. Get otc generic robitussin DM OR Mucinex DM and use as directed on the packaging for cough and congestion. Use otc generic saline nasal spray 2-3 times per day to irrigate/moisturize your nasal passages. Hycodan syrup 1-2 tsp qhs prn cough, #141ml.  Therapeutic expectations and side effect profile of medication discussed today.  Patient's questions answered.  An After Visit Summary was printed and given to the patient.  FOLLOW UP: Return  if symptoms worsen or fail to improve.

## 2015-02-27 ENCOUNTER — Telehealth: Payer: Self-pay | Admitting: Family Medicine

## 2015-02-27 ENCOUNTER — Other Ambulatory Visit: Payer: Self-pay | Admitting: Family Medicine

## 2015-02-27 MED ORDER — HYDROCODONE-HOMATROPINE 5-1.5 MG/5ML PO SYRP: ORAL_SOLUTION | ORAL | Status: DC

## 2015-02-27 NOTE — Telephone Encounter (Signed)
Please advise. Thanks.  

## 2015-02-27 NOTE — Telephone Encounter (Signed)
Pt advised and voiced understanding.   

## 2015-02-27 NOTE — Telephone Encounter (Signed)
Patient is out of cough syrup. Can an Rx be sent to [facility]?

## 2015-02-27 NOTE — Telephone Encounter (Signed)
I can print a rx for him to pick up but it cannot be faxed (hydrocodone).

## 2015-03-09 ENCOUNTER — Ambulatory Visit (INDEPENDENT_AMBULATORY_CARE_PROVIDER_SITE_OTHER): Payer: PRIVATE HEALTH INSURANCE | Admitting: Internal Medicine

## 2015-03-09 ENCOUNTER — Encounter: Payer: Self-pay | Admitting: Internal Medicine

## 2015-03-09 ENCOUNTER — Other Ambulatory Visit (INDEPENDENT_AMBULATORY_CARE_PROVIDER_SITE_OTHER): Payer: PRIVATE HEALTH INSURANCE | Admitting: *Deleted

## 2015-03-09 VITALS — BP 112/60 | HR 90 | Temp 98.1°F | Resp 12 | Wt 219.0 lb

## 2015-03-09 DIAGNOSIS — E1042 Type 1 diabetes mellitus with diabetic polyneuropathy: Secondary | ICD-10-CM | POA: Diagnosis not present

## 2015-03-09 DIAGNOSIS — I6523 Occlusion and stenosis of bilateral carotid arteries: Secondary | ICD-10-CM

## 2015-03-09 LAB — POCT GLYCOSYLATED HEMOGLOBIN (HGB A1C): Hemoglobin A1C: 8.9

## 2015-03-09 MED ORDER — INSULIN ASPART 100 UNIT/ML ~~LOC~~ SOLN: SUBCUTANEOUS | Status: DC

## 2015-03-09 NOTE — Progress Notes (Signed)
Patient ID: [name], male   DOB: [birth year], 65 y.o.   MRN: [id number]  HPI: [name] is a 65 y.o.-year-old male, returning for f/u for DM1, dx 1964 (65 y/o), uncontrolled, with complications (CAD, proliferative diabetic retinopathy-history of retinal detachment OD, right carotid stenosis, cerebrovascular disease - s/p TIA 01/2014, PVD, peripheral neuropathy, ED). Last visit 6 mo ago.  Last hemoglobin A1c was: Lab Results  Component Value Date   HGBA1C 8.4 08/13/2014   HGBA1C 8.2* 05/07/2014   HGBA1C 9.5* 01/13/2014   Pt is on an insulin pump: One Touch Ping (Animas) with NovoLog. He got a replacement pump in 10/2014.  Pump settings: - basal rate:  MN: 0.55 2 am: 0.75 7 am: 1.1 11 am: 1.1 5 PM: 0.85 11pm 0.65 - ICR:  12 am: 15 6 am: 12 >> 10 (did not change this as advised at last visit...) 10 am 12 4 pm: 10 - ISF:   MN: 30   11 am: 25  9 pm: 40 - target:  MN: 140 5:30 am: 110  10 pm: 140   - IOB: 4 h  TDD bolus ave 45% (16.8 units) TDD basal ave 55% (20.4 units)  Sugars: 70 >> 68% above target, 2 >> 0% below target, 28 >> 32% within target  Pt checks his sugars 4.1x a day and they are still high - ave 207+/-86 >> 229+/-71 >> 236. We downloaded his pump and his meter:  Prev:  - am: 56-180, 236 >> 54, 87-265, 377 >> 112-266, 300, 343 >> 66-204 >> 56, 62-195, 258, 360 >> 76, 98-253, 397 - 2h after b'fast:190, 441 >> n/c >> 110-315 >> 130-140 >> 202-367 >> 134, 266 - before lunch: 119-228, 326 >> 141-208 >> 52-183, 355 when sick >> 61, 96-294, 330 >> 147-296, 481 - 2h after lunch: 197-301 >> n/c >> 99-212, 409 >> 128, 337 when sick >> 324, 325 >> 232-423 - before dinner: 57x1 (over correction), 102-214, 352 >> 91-321, higher values when sick >> 77-330 >> 111-262 - bedtime: 112-450 >> 84-315 >> 125-391 >> 161-388 >> 139-333 >> 87, 116-282 >> 108-289 >> 123-356 - nighttime: 66 >> 56, 190-407 >> 275, 172 >> 110, 193, 393 >> 110-190, 295 >> 261, 263 >> 115,  122 + few lows. Lowest sugar was 40s (after physical exertion) >> 56 >> 76; he has hypoglycemia awareness at 100. Highest: 439 >> 481  Glucometer: [employer].  Pt's meals are: - Breakfast: 1.5 scramble eggs, toast, milk - 65 g carbs - Lunch: sandwich, shaved chicken or [place] breast, cheese, mayo, chips, soda - 100 carbs - Dinner: meat, vegetables, bread, fruit, milk - 100 g carbs - Snacks: 2-3 a day: bagel midmorning, evening snack: chips, popcorn  Pt does have mild chronic kidney disease, last BUN/creatinine was:  Lab Results  Component Value Date   BUN 16 01/17/2014   CREATININE 1.13 01/17/2014  Last microalbumin/creatinine ratio was 3.8.  Last set of lipids: Lab Results  Component Value Date   CHOL 244* 10/09/2014   HDL 64.20 10/09/2014   LDLCALC 162* 10/09/2014   LDLDIRECT 159.5 04/21/2011   TRIG 86.0 10/09/2014   CHOLHDL 4 10/09/2014  He is not on a statin, this is listed as an intolerance. Pt's last eye exam was in 02/28/2014 [facility] He has proliferative DR, s/p detached retina. He was advised to return in a year. Has numbness and tingling in his legs.  He also has a history of hyperlipidemia, disequilibrium, mild cognitive impairment.  Also has hypothyroidism-status post thyroidectomy for multinodular goiter. Last TSH: Lab Results  Component Value Date   TSH 4.22 10/09/2014   He also has OSA-on CPAP, GERD.  I reviewed pt's medications, allergies, PMH, social hx, family hx, and changes were documented in the history of present illness. Otherwise, unchanged from my initial visit note.  ROS: Constitutional: no weight gain, no fatigue, no subjective hyperthermia/hypothermia Eyes: no blurry vision, no xerophthalmia ENT: no sore throat, no nodules palpated in throat, no dysphagia/odynophagia, no hoarseness Cardiovascular: no CP/SOB/palpitations/ leg swelling Respiratory: no cough/SOB Gastrointestinal: no N/V/D/C Musculoskeletal: no muscle  aches/joint aches Skin: no rashes Neurological: no tremors/numbness/tingling/dizziness  PE: BP 112/60 mmHg  Pulse 90  Temp(Src) 98.1 F (36.7 C) (Oral)  Resp 12  Wt 219 lb (99.338 kg)  SpO2 96% Body mass index is 28.11 kg/(m^2). Wt Readings from Last 3 Encounters:  03/09/15 219 lb (99.338 kg)  02/20/15 219 lb 12 oz (99.678 kg)  12/22/14 220 lb (99.791 kg)  Body mass index is 28.11 kg/(m^2).  Constitutional: slightly overweight, in NAD Eyes: PERRLA, EOMI, no exophthalmos ENT: moist mucous membranes, no thyromegaly, no cervical lymphadenopathy Cardiovascular: RRR, + 2/6 SEM, no RG Respiratory: CTA B Gastrointestinal: abdomen soft, NT, ND, BS+ Musculoskeletal: no deformities, strength intact in all 4 Skin: moist, warm, no rashes  ASSESSMENT: 1. 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  Patient with long-standing type 1 diabetes, poorly controlled, with multiple complications, on an insulin pump.  Sugars are higher over the winter. At last visit, I advised him to increase the insulin sensitivity factor to 40 and also his target blood sugar 140 at night. We also discussed about temporary basal rates which are needed if he stays active (start with a 10-20% decrease) or if he is sick (10-20% increase).  However, the above changes are not enough , as most of his sugars are in the hypoglycemic range, despite stopping his sweet tea sipping throughout the day. We'll need to increase his  basal rates, decrease his insulin to carb ratios, and also his targets. -   He feels that part of the problem is his Humalog, which is not as efficient as NovoLog.  Will send  Novolog to his pharmacy  And will need a preauthorization form  If the insurance does not approve this. -  I advised him to: Patient Instructions  Please change the pump settings as follows: - basal rate:  MN: 0.55 >> 0.7 2 am: 0.75 >> 1.1 7 am: 1.1 11 am: 1.1 5 PM: 0.85 >> 1.0 11pm 0.65 >> 0.85 - ICR:  12 am: 15 6 am: 12 >> 10 10 am 12 >> 10 4 pm: 10 - ISF:   MN: 30   11 am: 25  9 pm: 40 - target:  MN: 140 >> 120 5:30 am: 110  10 pm: 140 >> 120   - IOB: 4 h   Please bolus for lunch every day.  Please come back for a follow-up appointment in 3 months.  - will check a hemoglobin A1c today >> 8.9% (higher) - RTC in 3 mo  - time spent with the patient: 40 min, of which >50% was spent in reviewing his  pump downloads, discussing his hypo- and hyper-glycemic episodes, reviewing his previous labs and pump settings and developing a plan to avoid hypo- an hyper-glycemia.

## 2015-03-09 NOTE — Patient Instructions (Addendum)
Please change the pump settings as follows: - basal rate:  MN: 0.55 >> 0.7 2 am: 0.75 >> 1.1 7 am: 1.1 11 am: 1.1 5 PM: 0.85 >> 1.0 11pm 0.65 >> 0.85 - ICR:  12 am: 15 6 am: 12 >> 10 10 am 12 >> 10 4 pm: 10 - ISF:   MN: 30   11 am: 25  9 pm: 40 - target:  MN: 140 >> 120 5:30 am: 110  10 pm: 140 >> 120   - IOB: 4 h   Please bolus for lunch every day.  Please come back for a follow-up appointment in 3 months.

## 2015-03-16 ENCOUNTER — Other Ambulatory Visit: Payer: Self-pay | Admitting: *Deleted

## 2015-03-16 MED ORDER — CLOPIDOGREL BISULFATE 75 MG PO TABS: 75.0000 mg | ORAL_TABLET | Freq: Every day | ORAL | Status: DC

## 2015-03-17 ENCOUNTER — Other Ambulatory Visit: Payer: Self-pay | Admitting: *Deleted

## 2015-03-17 MED ORDER — CLOTRIMAZOLE-BETAMETHASONE 1-0.05 % EX CREA: 1.0000 "application " | TOPICAL_CREAM | Freq: Every day | CUTANEOUS | Status: DC | PRN

## 2015-03-17 NOTE — Telephone Encounter (Signed)
Pt LMOM on 03/17/15 at 11:32am requesting refill, wants [facility] [address]   RF request for clotrimazole-betamethasone LOV: 03/09/15 Next ov: 04/09/15 Last written: unknown  Please advise. Thanks.

## 2015-03-31 ENCOUNTER — Telehealth: Payer: Self-pay | Admitting: Cardiovascular Disease

## 2015-03-31 ENCOUNTER — Encounter: Payer: Self-pay | Admitting: Family Medicine

## 2015-03-31 NOTE — Telephone Encounter (Signed)
Clearance request sent to Dr. Allyson SabalBerry.

## 2015-03-31 NOTE — Telephone Encounter (Signed)
ROUTED TO TIFFANY AT [facility]

## 2015-03-31 NOTE — Telephone Encounter (Signed)
No problem with him having an MRI with a CAS ( 3 Tesla magnet or less)  JJB

## 2015-03-31 NOTE — Telephone Encounter (Signed)
New message      Request for surgical clearance:  What type of surgery is being performed?  MRI When is this surgery scheduled? Pending clearance Are there any medications that need to be held prior to surgery and how long? Pt has carotid stents-- can he have an MRI Name of physician performing surgery? Dr Evelena Asaitchie 1. What is your office phone and fax number?  Fax [phone]---please fax something in writing

## 2015-04-09 ENCOUNTER — Ambulatory Visit: Payer: PRIVATE HEALTH INSURANCE | Admitting: Family Medicine

## 2015-05-04 DIAGNOSIS — G459 Transient cerebral ischemic attack, unspecified: Secondary | ICD-10-CM

## 2015-05-04 HISTORY — DX: Transient cerebral ischemic attack, unspecified: G45.9

## 2015-05-06 ENCOUNTER — Encounter (HOSPITAL_BASED_OUTPATIENT_CLINIC_OR_DEPARTMENT_OTHER): Payer: Self-pay | Admitting: *Deleted

## 2015-05-06 ENCOUNTER — Emergency Department (HOSPITAL_BASED_OUTPATIENT_CLINIC_OR_DEPARTMENT_OTHER): Payer: PRIVATE HEALTH INSURANCE

## 2015-05-06 ENCOUNTER — Observation Stay (HOSPITAL_BASED_OUTPATIENT_CLINIC_OR_DEPARTMENT_OTHER)
Admission: EM | Admit: 2015-05-06 | Discharge: 2015-05-07 | Disposition: A | Discharge status: Home / Self Care | Payer: PRIVATE HEALTH INSURANCE | Attending: Internal Medicine | Admitting: Internal Medicine

## 2015-05-06 DIAGNOSIS — E1042 Type 1 diabetes mellitus with diabetic polyneuropathy: Secondary | ICD-10-CM

## 2015-05-06 DIAGNOSIS — M1990 Unspecified osteoarthritis, unspecified site: Secondary | ICD-10-CM | POA: Diagnosis not present

## 2015-05-06 DIAGNOSIS — Z87891 Personal history of nicotine dependence: Secondary | ICD-10-CM | POA: Insufficient documentation

## 2015-05-06 DIAGNOSIS — I10 Essential (primary) hypertension: Secondary | ICD-10-CM | POA: Diagnosis not present

## 2015-05-06 DIAGNOSIS — R202 Paresthesia of skin: Secondary | ICD-10-CM | POA: Diagnosis present

## 2015-05-06 DIAGNOSIS — Z811 Family history of alcohol abuse and dependence: Secondary | ICD-10-CM | POA: Diagnosis not present

## 2015-05-06 DIAGNOSIS — G4733 Obstructive sleep apnea (adult) (pediatric): Secondary | ICD-10-CM | POA: Insufficient documentation

## 2015-05-06 DIAGNOSIS — Z888 Allergy status to other drugs, medicaments and biological substances status: Secondary | ICD-10-CM | POA: Insufficient documentation

## 2015-05-06 DIAGNOSIS — I872 Venous insufficiency (chronic) (peripheral): Secondary | ICD-10-CM | POA: Diagnosis not present

## 2015-05-06 DIAGNOSIS — Z800 Family history of malignant neoplasm of digestive organs: Secondary | ICD-10-CM | POA: Insufficient documentation

## 2015-05-06 DIAGNOSIS — Z9641 Presence of insulin pump (external) (internal): Secondary | ICD-10-CM | POA: Diagnosis not present

## 2015-05-06 DIAGNOSIS — Z79899 Other long term (current) drug therapy: Secondary | ICD-10-CM | POA: Insufficient documentation

## 2015-05-06 DIAGNOSIS — E1065 Type 1 diabetes mellitus with hyperglycemia: Secondary | ICD-10-CM | POA: Diagnosis not present

## 2015-05-06 DIAGNOSIS — Z8673 Personal history of transient ischemic attack (TIA), and cerebral infarction without residual deficits: Secondary | ICD-10-CM | POA: Insufficient documentation

## 2015-05-06 DIAGNOSIS — Z91048 Other nonmedicinal substance allergy status: Secondary | ICD-10-CM | POA: Insufficient documentation

## 2015-05-06 DIAGNOSIS — Z7902 Long term (current) use of antithrombotics/antiplatelets: Secondary | ICD-10-CM | POA: Diagnosis not present

## 2015-05-06 DIAGNOSIS — G459 Transient cerebral ischemic attack, unspecified: Principal | ICD-10-CM

## 2015-05-06 DIAGNOSIS — Z808 Family history of malignant neoplasm of other organs or systems: Secondary | ICD-10-CM | POA: Diagnosis not present

## 2015-05-06 DIAGNOSIS — E7800 Pure hypercholesterolemia, unspecified: Secondary | ICD-10-CM | POA: Insufficient documentation

## 2015-05-06 DIAGNOSIS — Z7982 Long term (current) use of aspirin: Secondary | ICD-10-CM | POA: Insufficient documentation

## 2015-05-06 DIAGNOSIS — E103599 Type 1 diabetes mellitus with proliferative diabetic retinopathy without macular edema, unspecified eye: Secondary | ICD-10-CM | POA: Diagnosis not present

## 2015-05-06 DIAGNOSIS — E890 Postprocedural hypothyroidism: Secondary | ICD-10-CM | POA: Diagnosis not present

## 2015-05-06 DIAGNOSIS — Z7951 Long term (current) use of inhaled steroids: Secondary | ICD-10-CM | POA: Diagnosis not present

## 2015-05-06 DIAGNOSIS — Z801 Family history of malignant neoplasm of trachea, bronchus and lung: Secondary | ICD-10-CM | POA: Insufficient documentation

## 2015-05-06 DIAGNOSIS — E1059 Type 1 diabetes mellitus with other circulatory complications: Secondary | ICD-10-CM | POA: Diagnosis present

## 2015-05-06 DIAGNOSIS — E785 Hyperlipidemia, unspecified: Secondary | ICD-10-CM | POA: Insufficient documentation

## 2015-05-06 DIAGNOSIS — Z9842 Cataract extraction status, left eye: Secondary | ICD-10-CM | POA: Diagnosis not present

## 2015-05-06 DIAGNOSIS — K219 Gastro-esophageal reflux disease without esophagitis: Secondary | ICD-10-CM | POA: Diagnosis not present

## 2015-05-06 DIAGNOSIS — I6521 Occlusion and stenosis of right carotid artery: Secondary | ICD-10-CM | POA: Insufficient documentation

## 2015-05-06 DIAGNOSIS — Z794 Long term (current) use of insulin: Secondary | ICD-10-CM | POA: Insufficient documentation

## 2015-05-06 DIAGNOSIS — R42 Dizziness and giddiness: Secondary | ICD-10-CM | POA: Diagnosis present

## 2015-05-06 DIAGNOSIS — I2510 Atherosclerotic heart disease of native coronary artery without angina pectoris: Secondary | ICD-10-CM | POA: Diagnosis not present

## 2015-05-06 DIAGNOSIS — I350 Nonrheumatic aortic (valve) stenosis: Secondary | ICD-10-CM | POA: Diagnosis not present

## 2015-05-06 DIAGNOSIS — R200 Anesthesia of skin: Secondary | ICD-10-CM | POA: Diagnosis present

## 2015-05-06 DIAGNOSIS — G451 Carotid artery syndrome (hemispheric): Secondary | ICD-10-CM

## 2015-05-06 DIAGNOSIS — I639 Cerebral infarction, unspecified: Secondary | ICD-10-CM

## 2015-05-06 LAB — RAPID URINE DRUG SCREEN, HOSP PERFORMED
AMPHETAMINES: NOT DETECTED
Barbiturates: NOT DETECTED
Benzodiazepines: NOT DETECTED
Cocaine: NOT DETECTED
OPIATES: NOT DETECTED
TETRAHYDROCANNABINOL: NOT DETECTED

## 2015-05-06 LAB — URINE MICROSCOPIC-ADD ON

## 2015-05-06 LAB — DIFFERENTIAL
BASOS ABS: 0 10*3/uL (ref 0.0–0.1)
BASOS PCT: 0 %
EOS ABS: 0.2 10*3/uL (ref 0.0–0.7)
Eosinophils Relative: 3 %
Lymphocytes Relative: 24 %
Lymphs Abs: 1.6 10*3/uL (ref 0.7–4.0)
Monocytes Absolute: 0.6 10*3/uL (ref 0.1–1.0)
Monocytes Relative: 9 %
NEUTROS PCT: 64 %
Neutro Abs: 4.2 10*3/uL (ref 1.7–7.7)

## 2015-05-06 LAB — CBC
HCT: 39.4 % (ref 39.0–52.0)
Hemoglobin: 13.9 g/dL (ref 13.0–17.0)
MCH: 32 pg (ref 26.0–34.0)
MCHC: 35.3 g/dL (ref 30.0–36.0)
MCV: 90.6 fL (ref 78.0–100.0)
Platelets: 232 10*3/uL (ref 150–400)
RBC: 4.35 MIL/uL (ref 4.22–5.81)
RDW: 13.2 % (ref 11.5–15.5)
WBC: 6.6 10*3/uL (ref 4.0–10.5)

## 2015-05-06 LAB — COMPREHENSIVE METABOLIC PANEL
ALBUMIN: 3.7 g/dL (ref 3.5–5.0)
ALT: 20 U/L (ref 17–63)
AST: 19 U/L (ref 15–41)
Alkaline Phosphatase: 80 U/L (ref 38–126)
Anion gap: 6 (ref 5–15)
BUN: 18 mg/dL (ref 6–20)
CHLORIDE: 103 mmol/L (ref 101–111)
CO2: 28 mmol/L (ref 22–32)
Calcium: 8.7 mg/dL — ABNORMAL LOW (ref 8.9–10.3)
Creatinine, Ser: 1 mg/dL (ref 0.61–1.24)
GFR calc Af Amer: >60 mL/min (ref >60)
GFR calc non Af Amer: >60 mL/min (ref >60)
GLUCOSE: 205 mg/dL — AB (ref 65–99)
POTASSIUM: 4.7 mmol/L (ref 3.5–5.1)
SODIUM: 137 mmol/L (ref 135–145)
Total Bilirubin: 0.7 mg/dL (ref 0.3–1.2)
Total Protein: 6.9 g/dL (ref 6.5–8.1)

## 2015-05-06 LAB — URINALYSIS, ROUTINE W REFLEX MICROSCOPIC
BILIRUBIN URINE: NEGATIVE
Glucose, UA: >1000 mg/dL — AB
Hgb urine dipstick: NEGATIVE
KETONES UR: NEGATIVE mg/dL
Leukocytes, UA: NEGATIVE
NITRITE: NEGATIVE
PH: 5.5 (ref 5.0–8.0)
Protein, ur: NEGATIVE mg/dL
Specific Gravity, Urine: 1.021 (ref 1.005–1.030)

## 2015-05-06 LAB — APTT: APTT: 29 s (ref 24–37)

## 2015-05-06 LAB — TROPONIN I: Troponin I: <0.03 ng/mL (ref <0.031)

## 2015-05-06 LAB — ETHANOL

## 2015-05-06 LAB — PROTIME-INR
INR: 0.93 (ref 0.00–1.49)
Prothrombin Time: 12.7 seconds (ref 11.6–15.2)

## 2015-05-06 LAB — CBG MONITORING, ED: Glucose-Capillary: 200 mg/dL — ABNORMAL HIGH (ref 65–99)

## 2015-05-06 MED ORDER — IOPAMIDOL (ISOVUE-370) INJECTION 76%
100.0000 mL | Freq: Once | INTRAVENOUS | Status: AC | PRN
  Administered 2015-05-06: 100 mL via INTRAVENOUS

## 2015-05-06 NOTE — ED Notes (Signed)
Pt placed on auto vitals Q30. Patient placed on cardiac monitor.  

## 2015-05-06 NOTE — ED Provider Notes (Signed)
CSN: [id number]     Arrival date & time 05/06/15  1907 History  By signing my name below, I, Linna Darner, attest that this documentation has been prepared under the direction and in the presence of physician practitioner, Pricilla Loveless, MD. Electronically Signed: Linna Darner, Scribe. 05/06/2015. 7:19 PM.     Chief Complaint  Patient presents with  . Numbness    The history is provided by the patient. No language interpreter was used.     HPI Comments: [name] is a 65 y.o. male with h/o TIA, CAD, DM, and hypothyroidism who presents to the Emergency Department complaining of sudden onset, constant, improving, right arm and right leg numbness beginning approximately 2 hours ago. Pt states that he was not doing anything unusual when his numbness presented. He reports that his numbness lasted for 30 minutes; it first presented in his right arm and then radiated into his right leg; he also experienced numbness in his right fingers. Pt notes mild numbness in his right arm currently and no numbness in his right leg. Pt endorses a right-sided neck "fullness" feeling as well; he has a stent in this area. Pt additionally notes that he began experiencing equilibrium problems around 8 hours ago; he notes dizziness and difficulty standing up straight and walking in a straight line. He notes that he experienced a tendency to lean towards the right when his equilibrium problems presented. Pt notes that he ambulated into the ER with no issues. He states that his speech seems normal. He denies issues with his left side, weakness, blurry vision, chest pain, SOB, neuro deficits, or any other associated symptoms.  Past Medical History  Diagnosis Date  . Diabetes mellitus Dx'd age 86    Novolog via insulin pump; sub-optimal control x years  . High cholesterol     Takes 1/2 of 80mg  atorvastatin daily  . Hypothyroidism     Thyroidectomy 2002; multinodular goiter  . Erectile dysfunction     [place]  . Diabetic retinopathy     Proliferative: Hx of retinal detachment on right-light perception only in right eye  . History of tobacco abuse     Quit about 1990 (has 35 pack-yr hx)  . OSA (obstructive sleep apnea)     06/2011 sleep study: moderate OSA, CPAP at 12 cm H2O.  . Impaired vision     right eye light perception only; hx of retinal detachment.  . Recurrent pneumonia   . GERD (gastroesophageal reflux disease)   . Venous insufficiency   . Diastolic dysfunction 2007; 2016    TEE with diastolic dysfunction, EF 74%.  . Diabetic neuropathy (HCC)     fine touch and position sense affected  . Aortic stenosis, moderate Sept/Oct /2013    Mild/mod by echo; mild by cath 10/28/11.  Mild progression by echo 05/2012.  Further mild progression on 01/2014 echo (valve area 0.93 cm2)  . Carotid stenosis, right 09/2011    Dr. [name] did R carotid stenting 01/2014  . 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  Med mgmt recommended.  . Abnormal stress test 10/27/2011    lat isch--cardiac cath 10/28/2011  . Venous reflux 10/14/2011    venous doppler-R GSV continuous reflux throughout; too small for VNUS closure  . Decreased pedal pulses     LE doppler 11/08/11- no evidence of arterial insufficiency  . TIA (transient ischemic attack)  2015/16    with R carotid dz: pt got R carotid stent 01/2014 by Dr. Allyson SabalBerry and was put on dual antiplatelet therapy with ASA 325mg  and plavix 75mg  qd afterwards  . Osteoarthritis     Bilat thumb carpometacarpal joints.  . Impingement syndrome of right shoulder    Past Surgical History  Procedure Laterality Date  . Thyroid surgery  2002  . Lasix eye    . Eye surgery      Multiple laser surgeries for diabetic retinopathy, also cataract surgery [facility].  [name]. Retinal detachment surgery      Right eye  . Cataract extraction      left  . Transthoracic echocardiogram   10/2011;12/2012    Mod AS, mild LVH, EF 60-65%, no RWMA-being followed by Dr. Allyson SabalBerry  . Cardiac catheterization  10/2011    EF normal.  Diffuse 3 vessel CAD, no stents placed.  Medical mgmt per [facility].  [name]. Left and right heart catheterization with coronary angiogram N/A 10/28/2011    Procedure: LEFT AND R0IHT HEART CATHETERIZATION WITH CORONARY ANGIOGRAM;  Surgeon: Lennette Biharihomas A Kelly, MD;  [facility];  Service: Cardiovascular;  Laterality: N/A;  . Carotid stent insertion Right 01/16/2014    Procedure: CAROTID STENT INSERTION;  Surgeon: Runell GessJonathan J Berry, MD;  [facility];  Service: Cardiovascular;  Laterality: Right;   Family History  Problem Relation Age of Onset  . Cancer Mother     lung cancer  . Alcohol abuse Father   . Cancer Father     laryngeal cancer  . Cancer Brother     oldest brother had lung cancer and melanoma   Social History  Substance Use Topics  . Smoking status: Former Games developermoker  . Smokeless tobacco: Former NeurosurgeonUser    Quit date: 06/22/1987  . Alcohol Use: 0.0 oz/week    0 Standard drinks or equivalent per week    Review of Systems  Eyes: Negative for visual disturbance.  Respiratory: Negative for shortness of breath.   Cardiovascular: Negative for chest pain.  Neurological: Positive for dizziness and numbness. Negative for speech difficulty and weakness.  All other systems reviewed and are negative.   Allergies  Statins and Tape  Home Medications   Prior to Admission medications   Medication Sig Start Date End Date Taking? Authorizing Provider  albuterol (VENTOLIN HFA) 108 (90 BASE) MCG/ACT inhaler Inhale 1-2 puffs into the lungs every 4 (four) hours as needed for wheezing or shortness of breath. 04/23/14   Jeoffrey MassedPhilip H McGowen, MD  aspirin 81 MG tablet Take 81 mg by mouth daily.    Historical Provider, MD  clopidogrel (PLAVIX) 75 MG tablet Take 1 tablet (75 mg total) by mouth daily. 03/16/15   Runell GessJonathan J Berry, MD  clotrimazole-betamethasone  (LOTRISONE) cream Apply 1 application topically daily as needed (RASH). 03/17/15   Jeoffrey MassedPhilip H McGowen, MD  Feverfew 380 MG CAPS Take 380 mg by mouth daily. For migraine headaches    Historical Provider, MD  gabapentin (NEURONTIN) 300 MG capsule 1 tab po tid 04/16/14   Jeoffrey MassedPhilip H McGowen, MD  HYDROcodone-homatropine [facility](HYCODAN) 5-1.5 MG/5ML syrup 1-2 tsp po qhs prn cough 02/27/15   Jeoffrey MassedPhilip H McGowen, MD  insulin aspart (NOVOLOG) 100 UNIT/ML injection Use up to 60 units in the insulin pump per day 03/09/15   Carlus Pavlovristina Gherghe, MD  Insulin Human (INSULIN PUMP) SOLN Inject into the skin continuous. Basal rate .95/hr, current pump settings: 12am .45, 2am .65, 7am .95, 11am .95, 5pm .75, 11pm .  5    Historical Provider, MD  levothyroxine (SYNTHROID, LEVOTHROID) 137 MCG tablet Take 1 tablet (137 mcg total) by mouth daily. 07/17/14   [name], MD  Omega-3 Fatty Acids (FISH OIL) 1000 MG CAPS Take 1,000 mg by mouth daily.    Historical Provider, MD  oseltamivir (TAMIFLU) 75 MG capsule Take 1 capsule (75 mg total) by mouth 2 (two) times daily. 02/20/15   [name], MD  ranitidine (ZANTAC) 150 MG tablet Take 150 mg by mouth daily as needed for heartburn.    Historical Provider, MD  tadalafil (CIALIS) 20 MG tablet Take 1 tablet (20 mg total) by mouth daily as needed for erectile dysfunction. 10/03/14   Carlus Pavlov, MD   There were no vitals taken for this visit. Physical Exam  Constitutional: He is oriented to person, place, and time. He appears well-developed and well-nourished.  HENT:  Head: Normocephalic and atraumatic.  Right Ear: External ear normal.  Left Ear: External ear normal.  Nose: Nose normal.  Eyes: EOM are normal. Pupils are equal, round, and reactive to light. Right eye exhibits no discharge. Left eye exhibits no discharge.  Neck: Neck supple.  Cardiovascular: Normal rate, regular rhythm, normal heart sounds and intact distal pulses.   Pulmonary/Chest: Effort normal and breath sounds  normal.  Abdominal: Soft. There is no tenderness.  Musculoskeletal: He exhibits no edema.  Neurological: He is alert and oriented to person, place, and time.  CN 2-12 grossly intact. 5/5 strength in all four extremities. Normal finger to nose. Normal gross sensation in all four extremities. Normal gait, but patient feels unsteady.  Skin: Skin is warm and dry.  Nursing note and vitals reviewed.   ED Course  Procedures (including critical care time)  DIAGNOSTIC STUDIES: Oxygen Saturation is 100% on RA, normal by my interpretation.    COORDINATION OF CARE: 7:20 PM Discussed treatment plan with pt at bedside and pt agreed to plan.  Labs Review Labs Reviewed  COMPREHENSIVE METABOLIC PANEL - Abnormal; Notable for the following:    Glucose, Bld 205 (*)    Calcium 8.7 (*)    All other components within normal limits  URINALYSIS, ROUTINE W REFLEX MICROSCOPIC (NOT AT [facility]) - Abnormal; Notable for the following:    Glucose, UA >1000 (*)    All other components within normal limits  URINE MICROSCOPIC-ADD ON - Abnormal; Notable for the following:    Squamous Epithelial / LPF 0-5 (*)    Bacteria, UA RARE (*)    All other components within normal limits  CBG MONITORING, ED - Abnormal; Notable for the following:    Glucose-Capillary 200 (*)    All other components within normal limits  ETHANOL  PROTIME-INR  APTT  CBC  DIFFERENTIAL  URINE RAPID DRUG SCREEN, HOSP PERFORMED  TROPONIN I    Imaging Review Ct Angio Head W/cm &/or Wo Cm  05/06/2015  CLINICAL DATA:  New onset of right upper and lower extremity numbness beginning at 5 p.m. tonight. Dizziness since 10 a.m. today. Disequilibrium. EXAM: CT ANGIOGRAPHY HEAD AND NECK TECHNIQUE: Multidetector CT imaging of the head and neck was performed using the standard protocol during bolus administration of intravenous contrast. Multiplanar CT image reconstructions and MIPs were obtained to evaluate the vascular anatomy. Carotid stenosis  measurements (when applicable) are obtained utilizing NASCET criteria, using the distal internal carotid diameter as the denominator. CONTRAST:  100 mL Isovue 370 COMPARISON:  CTA of the neck 01/13/2014.  MRI brain 01/13/2014. FINDINGS: CT HEAD Brain:  No acute cortical infarct or hemorrhage is present. The basal ganglia are intact. The insular ribbon is normal. The ventricles are proportionate to a minimal atrophy. No significant extra-axial fluid collection is present. Vascular calcifications are present within the cavernous internal carotid arteries bilaterally. Calvarium and skull base: Within normal limits. Paranasal sinuses: Clear Orbits: High-density is again noted in the right globe. The a left lens replacement is present. The orbits are otherwise unremarkable. CTA NECK Aortic arch: A 3 vessel arch configuration is present. There is no significant stenosis at the aortic arch. Right carotid system: The right common carotid artery is within normal limits. There are carotid stent is in place. The stent is narrowed in the AP diameter to approximately 3 mm. The more distal cervical internal carotid artery is intact Left carotid system: The left common carotid artery is within normal limits. Atherosclerotic calcifications are again noted at the carotid bifurcation. The lumen is narrowed to 3 mm, similar to the prior study. This compares to a more distal vessel caliber of 4.5 mm. Vertebral arteries:Both vertebral arteries originate from the subclavian arteries. Vertebral arteries are codominant. There is no significant stenosis of either vertebral artery. Skeleton: Vertebral body heights alignment are maintained. The superior endplate Schmorl's node at C7 is stable. Vertebral body heights and alignment are maintained. Other neck: No focal mucosal or submucosal lesions are present. The tongue base is within normal limits. Vocal cords are midline and symmetric. The submandibular and parotid glands are within normal  limits bilaterally. There is no significant adenopathy. The thyroid is unremarkable. CTA HEAD Anterior circulation: Dense atherosclerotic calcifications are present within the cavernous internal carotid arteries bilaterally without focal stenosis. The ICA terminus is normal bilaterally. The A1 and M1 segments are unremarkable. The anterior communicating artery is patent. The MCA bifurcations are intact. ACA and MCA branch vessels are unremarkable. Posterior circulation: The vertebral arteries are codominant. The right PICA origin is extradural. The left AICA is dominant. The vertebrobasilar junction is within normal limits. The basilar artery is normal. Both posterior cerebral arteries originate from the basilar tip. The PCA branch vessels are intact. Venous sinuses: The dural sinuses are patent. The right transverse sinus is dominant. The straight sinus and deep cerebral veins are intact. The cortical veins are patent. Anatomic variants: None Delayed phase: The postcontrast images demonstrate no pathologic enhancement. IMPRESSION: 1. Interval placement of stent across the right carotid bifurcation. The stent appears patent. The distal right ICA demonstrates a slightly larger diameter than on the prior exam, suggesting improved flow. 2. Stable atherosclerotic disease at the left carotid bifurcation without a significant stenosis relative to the more distal vessel. 3. Dense atherosclerotic calcifications within the cavernous internal carotid arteries bilaterally without significant stenosis or change. 4. No significant atherosclerotic calcification or stenosis involving the posterior circulation. 5. ACA and MCA branch vessels are within normal limits. Electronically Signed   By: Marin Roberts M.D.   On: 05/06/2015 21:47   Ct Angio Neck W/cm &/or Wo/cm  05/06/2015  CLINICAL DATA:  New onset of right upper and lower extremity numbness beginning at 5 p.m. tonight. Dizziness since 10 a.m. today. Disequilibrium.  EXAM: CT ANGIOGRAPHY HEAD AND NECK TECHNIQUE: Multidetector CT imaging of the head and neck was performed using the standard protocol during bolus administration of intravenous contrast. Multiplanar CT image reconstructions and MIPs were obtained to evaluate the vascular anatomy. Carotid stenosis measurements (when applicable) are obtained utilizing NASCET criteria, using the distal internal carotid diameter as the denominator. CONTRAST:  100  mL Isovue 370 COMPARISON:  CTA of the neck 01/13/2014.  MRI brain 01/13/2014. FINDINGS: CT HEAD Brain: No acute cortical infarct or hemorrhage is present. The basal ganglia are intact. The insular ribbon is normal. The ventricles are proportionate to a minimal atrophy. No significant extra-axial fluid collection is present. Vascular calcifications are present within the cavernous internal carotid arteries bilaterally. Calvarium and skull base: Within normal limits. Paranasal sinuses: Clear Orbits: High-density is again noted in the right globe. The a left lens replacement is present. The orbits are otherwise unremarkable. CTA NECK Aortic arch: A 3 vessel arch configuration is present. There is no significant stenosis at the aortic arch. Right carotid system: The right common carotid artery is within normal limits. There are carotid stent is in place. The stent is narrowed in the AP diameter to approximately 3 mm. The more distal cervical internal carotid artery is intact Left carotid system: The left common carotid artery is within normal limits. Atherosclerotic calcifications are again noted at the carotid bifurcation. The lumen is narrowed to 3 mm, similar to the prior study. This compares to a more distal vessel caliber of 4.5 mm. Vertebral arteries:Both vertebral arteries originate from the subclavian arteries. Vertebral arteries are codominant. There is no significant stenosis of either vertebral artery. Skeleton: Vertebral body heights alignment are maintained. The  superior endplate Schmorl's node at C7 is stable. Vertebral body heights and alignment are maintained. Other neck: No focal mucosal or submucosal lesions are present. The tongue base is within normal limits. Vocal cords are midline and symmetric. The submandibular and parotid glands are within normal limits bilaterally. There is no significant adenopathy. The thyroid is unremarkable. CTA HEAD Anterior circulation: Dense atherosclerotic calcifications are present within the cavernous internal carotid arteries bilaterally without focal stenosis. The ICA terminus is normal bilaterally. The A1 and M1 segments are unremarkable. The anterior communicating artery is patent. The MCA bifurcations are intact. ACA and MCA branch vessels are unremarkable. Posterior circulation: The vertebral arteries are codominant. The right PICA origin is extradural. The left AICA is dominant. The vertebrobasilar junction is within normal limits. The basilar artery is normal. Both posterior cerebral arteries originate from the basilar tip. The PCA branch vessels are intact. Venous sinuses: The dural sinuses are patent. The right transverse sinus is dominant. The straight sinus and deep cerebral veins are intact. The cortical veins are patent. Anatomic variants: None Delayed phase: The postcontrast images demonstrate no pathologic enhancement. IMPRESSION: 1. Interval placement of stent across the right carotid bifurcation. The stent appears patent. The distal right ICA demonstrates a slightly larger diameter than on the prior exam, suggesting improved flow. 2. Stable atherosclerotic disease at the left carotid bifurcation without a significant stenosis relative to the more distal vessel. 3. Dense atherosclerotic calcifications within the cavernous internal carotid arteries bilaterally without significant stenosis or change. 4. No significant atherosclerotic calcification or stenosis involving the posterior circulation. 5. ACA and MCA branch  vessels are within normal limits. Electronically Signed   By: Marin Roberts M.D.   On: 05/06/2015 21:47   I have personally reviewed and evaluated these images and lab results as part of my medical decision-making.   EKG Interpretation   Date/Time:  Wednesday May 06 2015 19:18:57 EDT Ventricular Rate:  84 PR Interval:  151 QRS Duration: 84 QT Interval:  362 QTC Calculation: 428 R Axis:   21 Text Interpretation:  Sinus rhythm Borderline repol abnormality, diffuse  leads no significant change since Jan 2016 Confirmed by Criss Alvine MD, Lorin Picket  (  [id number]) on 05/06/2015 8:33:42 PM      MDM   Final diagnoses:  Numbness and tingling of right arm and leg  Disequilibrium    Patient's workup shows no hemorrhagic stroke or obvious large vessel occlusive disease. While he is able to walk, his disequilibrium feeling and right-sided numbness is concerning for stroke. Discussed with neurology, Dr. [name], patient is not a candidate for a code stroke as his symptoms started just over 8 hours ago and are improving. After workup in the ED, patient will be admitted to the hospitalist at [facility]. Dr. Julian Reil accepts in transfer. He will need MRI and further stroke workup. Patient in agreement with plan.  I personally performed the services described in this documentation, which was scribed in my presence. The recorded information has been reviewed and is accurate.   Pricilla Loveless, MD 05/06/15 [phone]

## 2015-05-06 NOTE — ED Notes (Signed)
C/o numbness in rt arm radiating down into rt leg onset this am  Fullness in rt neck area,  And dizziness, states diff walking a straight line

## 2015-05-06 NOTE — ED Notes (Signed)
Patient states he has had problems with equilibrium problems ( dizziness) since 1000 am today.  Around 1730 he developed right arm numbness which is now in his right leg.  History of DM. CVD with blockage, and stent in the right carotid.

## 2015-05-06 NOTE — Progress Notes (Signed)
65 yo M, h/o DM1, coming in with R sided numbness.  Has h/o TIA in past, stent to carotid on R side.  EDP spoke with Dr. Roseanne RenoStewart, let him know when patient arrives.

## 2015-05-06 NOTE — ED Notes (Signed)
Denies tingling at present

## 2015-05-07 ENCOUNTER — Other Ambulatory Visit (HOSPITAL_COMMUNITY): Payer: Self-pay

## 2015-05-07 ENCOUNTER — Observation Stay (HOSPITAL_COMMUNITY): Payer: PRIVATE HEALTH INSURANCE

## 2015-05-07 DIAGNOSIS — E1042 Type 1 diabetes mellitus with diabetic polyneuropathy: Secondary | ICD-10-CM

## 2015-05-07 DIAGNOSIS — I10 Essential (primary) hypertension: Secondary | ICD-10-CM | POA: Diagnosis not present

## 2015-05-07 DIAGNOSIS — G451 Carotid artery syndrome (hemispheric): Secondary | ICD-10-CM | POA: Diagnosis not present

## 2015-05-07 DIAGNOSIS — R202 Paresthesia of skin: Secondary | ICD-10-CM | POA: Diagnosis not present

## 2015-05-07 LAB — GLUCOSE, CAPILLARY
GLUCOSE-CAPILLARY: 203 mg/dL — AB (ref 65–99)
GLUCOSE-CAPILLARY: 264 mg/dL — AB (ref 65–99)

## 2015-05-07 LAB — LIPID PANEL
CHOL/HDL RATIO: 4 ratio
Cholesterol: 268 mg/dL — ABNORMAL HIGH (ref 0–200)
HDL: 67 mg/dL (ref >40)
LDL CALC: 186 mg/dL — AB (ref 0–99)
Triglycerides: 74 mg/dL (ref <150)
VLDL: 15 mg/dL (ref 0–40)

## 2015-05-07 MED ORDER — INSULIN PUMP
Freq: Three times a day (TID) | SUBCUTANEOUS | Status: DC
  Administered 2015-05-07: 11:00:00 via SUBCUTANEOUS
  Filled 2015-05-07: qty 1

## 2015-05-07 MED ORDER — LEVOTHYROXINE SODIUM 25 MCG PO TABS
137.0000 ug | ORAL_TABLET | Freq: Every day | ORAL | Status: DC
  Administered 2015-05-07: 137 ug via ORAL
  Filled 2015-05-07: qty 1

## 2015-05-07 MED ORDER — CLOPIDOGREL BISULFATE 75 MG PO TABS
75.0000 mg | ORAL_TABLET | Freq: Every day | ORAL | Status: DC
  Administered 2015-05-07: 75 mg via ORAL
  Filled 2015-05-07: qty 1

## 2015-05-07 MED ORDER — FAMOTIDINE 20 MG PO TABS: 20.0000 mg | ORAL_TABLET | Freq: Every day | ORAL | Status: DC

## 2015-05-07 MED ORDER — ALBUTEROL SULFATE (2.5 MG/3ML) 0.083% IN NEBU: 2.5000 mg | INHALATION_SOLUTION | RESPIRATORY_TRACT | Status: DC | PRN

## 2015-05-07 MED ORDER — STROKE: EARLY STAGES OF RECOVERY BOOK
Freq: Once | Status: AC
  Administered 2015-05-07: 01:00:00
  Filled 2015-05-07 (×2): qty 1

## 2015-05-07 MED ORDER — ENOXAPARIN SODIUM 40 MG/0.4ML ~~LOC~~ SOLN: 40.0000 mg | Freq: Every day | SUBCUTANEOUS | Status: DC

## 2015-05-07 MED ORDER — GABAPENTIN 300 MG PO CAPS
300.0000 mg | ORAL_CAPSULE | Freq: Three times a day (TID) | ORAL | Status: DC
  Administered 2015-05-07: 300 mg via ORAL
  Filled 2015-05-07: qty 1

## 2015-05-07 MED ORDER — ASPIRIN EC 81 MG PO TBEC
81.0000 mg | DELAYED_RELEASE_TABLET | Freq: Every day | ORAL | Status: DC
  Administered 2015-05-07: 81 mg via ORAL
  Filled 2015-05-07: qty 1

## 2015-05-07 MED ORDER — SENNOSIDES-DOCUSATE SODIUM 8.6-50 MG PO TABS: 1.0000 | ORAL_TABLET | Freq: Every evening | ORAL | Status: DC | PRN

## 2015-05-07 NOTE — Discharge Summary (Signed)
Physician Discharge Summary  [name]  WJX:[id number]  DOB: 05-Jun-1950  DOA: 05/06/2015  PCP: [name], MD  Outpatient specialists Endocrinology: Dr. Ernest Haber Cardiology: Dr. Nanetta Batty  Admit date: 05/06/2015 Discharge date: 05/07/2015  Time spent: Less than 30 minutes  Recommendations for Outpatient Follow-up:  1. Dr. Adriana Simas, PCP in 1 week. 2. Dr. Delia Heady, Neurology in 2 months. MDs office will arrange follow-up appointment. 3. Dr. Nanetta Batty, Cardiology, to discuss change in antiplatelet therapy.  Discharge Diagnoses:  Principal Problem:   Numbness and tingling of right arm and leg Active Problems:   DM type 1 with diabetic peripheral neuropathy (HCC)   Essential hypertension   Discharge Condition: Improved & Stable  Diet recommendation: Heart healthy and diabetic diet.  Filed Weights   05/06/15 1920  Weight: 96.163 kg (212 lb)    History of present illness:  65 year old male, married, independent of activities of daily living, PMH of type I DM diagnosed in 1964 when he was 65 years old, uncontrolled with complications (CAD, proliferative diabetic retinopathy, history of retinal detachment right OD, right carotid stenosis, cerebrovascular disease, PVD, peripheral neuropathy and erectile dysfunction), HLD, hypothyroid status post thyroidectomy, former smoker, OSA, GERD, moderate left ear, right carotid stenosis status post stenting 01/2014 and on DAPT (aspirin 81 MG + Plavix 75 MG), presented to [facility] [place] [facility] with complaints of right sided numbness that started at around 5:30 PM on 05/06/15. It initially started in his right upper extremity but subsequently involved his right lower extremity, right side of face and neck. Speech was slightly slurred. He had some unsteadiness of gait earlier that morning. Symptoms lasted approximately 1.5 hours then resolved spontaneously by the time he got to the ED. He reports compliance to his  antiplatelet medications. He was transferred to [facility] and admitted for observation and evaluation.  Hospital Course:   Left brain TIA - Resultant right-sided numbness which was transient and resolved without recurrence. - MRI brain without acute findings - CT head: ACA and MCA branch vessels are within normal limits. No significant calcification or stenosis of posterior circulation. - CT and neck: Interval placement of stent across right carotid bifurcation. The stent appears patent. Stable atherosclerotic disease of left carotid bifurcation without significant stenosis relative to the more distal vessel. - 2-D echo 01/13/15: LVEF 60-65 percent. As discussed with neurology, no need to repeat echo during this admission. - LDL 186. Intolerant to statins. - Hemoglobin A1c 8.9 in March. Follows with endocrinology as outpatient. - Patient compliant with aspirin 81 MG daily and Plavix 75 MG daily. As per neurology, okay to stop aspirin from stroke standpoint. However recommended continuing both until outpatient follow-up with Dr. [name] who can make final decision. - Neurology follow-up appreciated. Discussed with Dr. Pearlean Brownie who has cleared him for discharge home and will arrange for outpatient follow-up in 2 months.  Essential hypertension - Mildly uncontrolled and fluctuating. Consider ACEI at outpatient follow-up with PCP or endocrinology.  Hyperlipidemia - LDL 186, goal <70. Intolerant to statins.  Poorly controlled type I DM with multiple complications - Hemoglobin A1c 8.9 in March, goal <7. On insulin pump. Outpatient follow-up with endocrinology.  OSA - Continue nightly CPAP  Aortic stenosis, moderate - Outpatient follow-up with cardiology  Right carotid stenosis status post stent - Outpatient follow-up with cardiology to discuss change in antiplatelet therapy.  Hypothyroid - Continue Synthroid  GERD - Continue when necessary H2 blocker   Consultations:  Neurology/stroke  service  Procedures:  None  Discharge Exam:  Complaints:  Denies complaints. No recurrence of right-sided numbness. No weakness or slurred speech reported.  Filed Vitals:   05/07/15 0027 05/07/15 0239 05/07/15 0539 05/07/15 0800  BP: 152/72 137/56 135/65 132/74  Pulse: 76 74 77 73  Temp: 97.6 F (36.4 C) 97.6 F (36.4 C) 97.5 F (36.4 C) 97.3 F (36.3 C)  TempSrc: Oral Oral Oral Oral  Resp: 17 17 17 17   Height:      Weight:      SpO2: 95% 100% 96% 97%    General exam: Pleasant middle-aged male sitting up comfortably in bed eating breakfast this morning. Respiratory system: Clear. No increased work of breathing. Cardiovascular system: S1 & S2 heard, RRR. No JVD, gallops, clicks or pedal edema. Grade 2 x 6 systolic ejection murmur best heard at apex. Telemetry: Sinus rhythm. No carotid bruit appreciated. Gastrointestinal system: Abdomen is nondistended, soft and nontender. Normal bowel sounds heard. Central nervous system: Alert and oriented. No focal neurological deficits. Extremities: Symmetric 5 x 5 power.  Discharge Instructions      Discharge Instructions    Ambulatory referral to Neurology    Complete by:  As directed   Please schedule post stroke follow up in 2 months.     Call MD for:    Complete by:  As directed   Recurrent strokelike symptoms.     Diet - low sodium heart healthy    Complete by:  As directed      Diet Carb Modified    Complete by:  As directed      Increase activity slowly    Complete by:  As directed             Medication List    TAKE these medications        aspirin 81 MG tablet  Take 81 mg by mouth daily.     clopidogrel 75 MG tablet  Commonly known as:  PLAVIX  Take 1 tablet (75 mg total) by mouth daily.     clotrimazole-betamethasone cream  Commonly known as:  LOTRISONE  Apply 1 application topically daily as needed (RASH).     insulin aspart 100 UNIT/ML injection  Commonly known as:  novoLOG  Use up to 60 units  in the insulin pump per day     insulin pump Soln  Inject into the skin continuous. Basal rate .95/hr, current pump settings: 12am .45, 2am .65, 7am .95, 11am .95, 5pm .75, 11pm .5     levothyroxine 137 MCG tablet  Commonly known as:  SYNTHROID, LEVOTHROID  Take 1 tablet (137 mcg total) by mouth daily.     ranitidine 150 MG tablet  Commonly known as:  ZANTAC  Take 150 mg by mouth daily as needed for heartburn.     tadalafil 20 MG tablet  Commonly known as:  CIALIS  Take 1 tablet (20 mg total) by mouth daily as needed for erectile dysfunction.       Follow-up Information    Follow up with SETHI,PRAMOD, MD In 2 months.   Specialties:  Neurology, Radiology   Why:  Stroke Clinic, Office will call you with appointment date & time   Contact information:   [address] [place] [id number] [phone]       Follow up with Jeoffrey MassedMCGOWEN,PHILIP H, MD. Schedule an appointment as soon as possible for a visit in 1 week.   Specialty:  Family Medicine   Contact information:   [address]  [place] [address] [phone]       Schedule an appointment as soon as possible for a visit with Nanetta Batty, MD.   Specialties:  Cardiology, Radiology   Why:  To discuss neurologist recommendation regarding stopping one of the 2 blood thinners.   Contact information:   [address] [place] [address] [phone]       Get Medicines reviewed and adjusted: Please take all your medications with you for your next visit with your Primary MD  Please request your Primary MD to go over all hospital tests and procedure/radiological results at the follow up. Please ask your Primary MD to get all Hospital records sent to his/her office.  If you experience worsening of your admission symptoms, develop shortness of breath, life threatening emergency, suicidal or homicidal thoughts you must seek medical attention immediately by calling 911 or calling your MD  immediately if symptoms less severe.  You must read complete instructions/literature along with all the possible adverse reactions/side effects for all the Medicines you take and that have been prescribed to you. Take any new Medicines after you have completely understood and accept all the possible adverse reactions/side effects.   Do not drive when taking pain medications.   Do not take more than prescribed Pain, Sleep and Anxiety Medications  Special Instructions: If you have smoked or chewed Tobacco in the last 2 yrs please stop smoking, stop any regular Alcohol and or any Recreational drug use.  Wear Seat belts while driving.  Please note  You were cared for by a hospitalist during your hospital stay. Once you are discharged, your primary care physician will handle any further medical issues. Please note that NO REFILLS for any discharge medications will be authorized once you are discharged, as it is imperative that you return to your primary care physician (or establish a relationship with a primary care physician if you do not have one) for your aftercare needs so that they can reassess your need for medications and monitor your lab values.    The results of significant diagnostics from this hospitalization (including imaging, microbiology, ancillary and laboratory) are listed below for reference.    Significant Diagnostic Studies: Ct Angio Head W/cm &/or Wo Cm  05/06/2015  CLINICAL DATA:  New onset of right upper and lower extremity numbness beginning at 5 p.m. tonight. Dizziness since 10 a.m. today. Disequilibrium. EXAM: CT ANGIOGRAPHY HEAD AND NECK TECHNIQUE: Multidetector CT imaging of the head and neck was performed using the standard protocol during bolus administration of intravenous contrast. Multiplanar CT image reconstructions and MIPs were obtained to evaluate the vascular anatomy. Carotid stenosis measurements (when applicable) are obtained utilizing NASCET criteria, using  the distal internal carotid diameter as the denominator. CONTRAST:  100 mL Isovue 370 COMPARISON:  CTA of the neck 01/13/2014.  MRI brain 01/13/2014. FINDINGS: CT HEAD Brain: No acute cortical infarct or hemorrhage is present. The basal ganglia are intact. The insular ribbon is normal. The ventricles are proportionate to a minimal atrophy. No significant extra-axial fluid collection is present. Vascular calcifications are present within the cavernous internal carotid arteries bilaterally. Calvarium and skull base: Within normal limits. Paranasal sinuses: Clear Orbits: High-density is again noted in the right globe. The a left lens replacement is present. The orbits are otherwise unremarkable. CTA NECK Aortic arch: A 3 vessel arch configuration is present. There is no significant stenosis at the aortic arch. Right carotid system: The right common carotid artery is within normal limits. There  are carotid stent is in place. The stent is narrowed in the AP diameter to approximately 3 mm. The more distal cervical internal carotid artery is intact Left carotid system: The left common carotid artery is within normal limits. Atherosclerotic calcifications are again noted at the carotid bifurcation. The lumen is narrowed to 3 mm, similar to the prior study. This compares to a more distal vessel caliber of 4.5 mm. Vertebral arteries:Both vertebral arteries originate from the subclavian arteries. Vertebral arteries are codominant. There is no significant stenosis of either vertebral artery. Skeleton: Vertebral body heights alignment are maintained. The superior endplate Schmorl's node at C7 is stable. Vertebral body heights and alignment are maintained. Other neck: No focal mucosal or submucosal lesions are present. The tongue base is within normal limits. Vocal cords are midline and symmetric. The submandibular and parotid glands are within normal limits bilaterally. There is no significant adenopathy. The thyroid is  unremarkable. CTA HEAD Anterior circulation: Dense atherosclerotic calcifications are present within the cavernous internal carotid arteries bilaterally without focal stenosis. The ICA terminus is normal bilaterally. The A1 and M1 segments are unremarkable. The anterior communicating artery is patent. The MCA bifurcations are intact. ACA and MCA branch vessels are unremarkable. Posterior circulation: The vertebral arteries are codominant. The right PICA origin is extradural. The left AICA is dominant. The vertebrobasilar junction is within normal limits. The basilar artery is normal. Both posterior cerebral arteries originate from the basilar tip. The PCA branch vessels are intact. Venous sinuses: The dural sinuses are patent. The right transverse sinus is dominant. The straight sinus and deep cerebral veins are intact. The cortical veins are patent. Anatomic variants: None Delayed phase: The postcontrast images demonstrate no pathologic enhancement. IMPRESSION: 1. Interval placement of stent across the right carotid bifurcation. The stent appears patent. The distal right ICA demonstrates a slightly larger diameter than on the prior exam, suggesting improved flow. 2. Stable atherosclerotic disease at the left carotid bifurcation without a significant stenosis relative to the more distal vessel. 3. Dense atherosclerotic calcifications within the cavernous internal carotid arteries bilaterally without significant stenosis or change. 4. No significant atherosclerotic calcification or stenosis involving the posterior circulation. 5. ACA and MCA branch vessels are within normal limits. Electronically Signed   By: Marin Roberts M.D.   On: 05/06/2015 21:47   Ct Angio Neck W/cm &/or Wo/cm  05/06/2015  CLINICAL DATA:  New onset of right upper and lower extremity numbness beginning at 5 p.m. tonight. Dizziness since 10 a.m. today. Disequilibrium. EXAM: CT ANGIOGRAPHY HEAD AND NECK TECHNIQUE: Multidetector CT imaging  of the head and neck was performed using the standard protocol during bolus administration of intravenous contrast. Multiplanar CT image reconstructions and MIPs were obtained to evaluate the vascular anatomy. Carotid stenosis measurements (when applicable) are obtained utilizing NASCET criteria, using the distal internal carotid diameter as the denominator. CONTRAST:  100 mL Isovue 370 COMPARISON:  CTA of the neck 01/13/2014.  MRI brain 01/13/2014. FINDINGS: CT HEAD Brain: No acute cortical infarct or hemorrhage is present. The basal ganglia are intact. The insular ribbon is normal. The ventricles are proportionate to a minimal atrophy. No significant extra-axial fluid collection is present. Vascular calcifications are present within the cavernous internal carotid arteries bilaterally. Calvarium and skull base: Within normal limits. Paranasal sinuses: Clear Orbits: High-density is again noted in the right globe. The a left lens replacement is present. The orbits are otherwise unremarkable. CTA NECK Aortic arch: A 3 vessel arch configuration is present. There is no significant  stenosis at the aortic arch. Right carotid system: The right common carotid artery is within normal limits. There are carotid stent is in place. The stent is narrowed in the AP diameter to approximately 3 mm. The more distal cervical internal carotid artery is intact Left carotid system: The left common carotid artery is within normal limits. Atherosclerotic calcifications are again noted at the carotid bifurcation. The lumen is narrowed to 3 mm, similar to the prior study. This compares to a more distal vessel caliber of 4.5 mm. Vertebral arteries:Both vertebral arteries originate from the subclavian arteries. Vertebral arteries are codominant. There is no significant stenosis of either vertebral artery. Skeleton: Vertebral body heights alignment are maintained. The superior endplate Schmorl's node at C7 is stable. Vertebral body heights and  alignment are maintained. Other neck: No focal mucosal or submucosal lesions are present. The tongue base is within normal limits. Vocal cords are midline and symmetric. The submandibular and parotid glands are within normal limits bilaterally. There is no significant adenopathy. The thyroid is unremarkable. CTA HEAD Anterior circulation: Dense atherosclerotic calcifications are present within the cavernous internal carotid arteries bilaterally without focal stenosis. The ICA terminus is normal bilaterally. The A1 and M1 segments are unremarkable. The anterior communicating artery is patent. The MCA bifurcations are intact. ACA and MCA branch vessels are unremarkable. Posterior circulation: The vertebral arteries are codominant. The right PICA origin is extradural. The left AICA is dominant. The vertebrobasilar junction is within normal limits. The basilar artery is normal. Both posterior cerebral arteries originate from the basilar tip. The PCA branch vessels are intact. Venous sinuses: The dural sinuses are patent. The right transverse sinus is dominant. The straight sinus and deep cerebral veins are intact. The cortical veins are patent. Anatomic variants: None Delayed phase: The postcontrast images demonstrate no pathologic enhancement. IMPRESSION: 1. Interval placement of stent across the right carotid bifurcation. The stent appears patent. The distal right ICA demonstrates a slightly larger diameter than on the prior exam, suggesting improved flow. 2. Stable atherosclerotic disease at the left carotid bifurcation without a significant stenosis relative to the more distal vessel. 3. Dense atherosclerotic calcifications within the cavernous internal carotid arteries bilaterally without significant stenosis or change. 4. No significant atherosclerotic calcification or stenosis involving the posterior circulation. 5. ACA and MCA branch vessels are within normal limits. Electronically Signed   By: Marin Roberts M.D.   On: 05/06/2015 21:47   Mr Brain Wo Contrast  05/07/2015  CLINICAL DATA:  Transient right-sided numbness. EXAM: MRI HEAD WITHOUT CONTRAST TECHNIQUE: Multiplanar, multiecho pulse sequences of the brain and surrounding structures were obtained without intravenous contrast. COMPARISON:  CT head without contrast 05/06/2015. MRI brain 01/13/2014. FINDINGS: The diffusion-weighted images demonstrate no evidence for acute or subacute infarction. The ventricles are of normal size. No acute hemorrhage or mass lesion is present. There is no significant white matter disease. No significant extraaxial fluid collection is present. The internal auditory canals are within normal limits bilaterally. Flow is present in the major intracranial arteries. 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 The orbits are otherwise unremarkable. Mild mucosal thickening is present in the ethmoid air cells bilaterally. No fluid levels are present. The mastoid air cells are clear. Skullbase is within normal limits. Midline sagittal images are unremarkable. IMPRESSION: 1. Normal MRI the brain for age. 2. Stable chronic changes in the vitreous of the right globe. Electronically Signed   By: Marin Roberts M.D.   On: 05/07/2015 08:17    Microbiology:  No results found for this or any previous visit (from the past 240 hour(s)).   Labs: Basic Metabolic Panel:  Recent Labs Lab 05/06/15 1927  NA 137  K 4.7  CL 103  CO2 28  GLUCOSE 205*  BUN 18  CREATININE 1.00  CALCIUM 8.7*   Liver Function Tests:  Recent Labs Lab 05/06/15 1927  AST 19  ALT 20  ALKPHOS 80  BILITOT 0.7  PROT 6.9  ALBUMIN 3.7   No results for input(s): LIPASE, AMYLASE in the last 168 hours. No results for input(s): AMMONIA in the last 168 hours. CBC:  Recent Labs Lab 05/06/15 1927  WBC 6.6  NEUTROABS 4.2  HGB 13.9  HCT 39.4  MCV 90.6  PLT 232   Cardiac Enzymes:  Recent Labs Lab 05/06/15 1927  TROPONINI <0.03    BNP: BNP (last 3 results) No results for input(s): BNP in the last 8760 hours.  ProBNP (last 3 results) No results for input(s): PROBNP in the last 8760 hours.  CBG:  Recent Labs Lab 05/06/15 1926 05/07/15 0801 05/07/15 1155  GLUCAP 200* 203* 264*       Signed:  Marcellus Scott, MD, FACP, FHM. [facility] Pager [phone]  If 7PM-7AM, please contact night-coverage [email or web address] Password [email or web address] 05/07/2015, 12:15 PM

## 2015-05-07 NOTE — Discharge Instructions (Signed)
Transient Ischemic Attack °A transient ischemic attack (TIA) is a "warning stroke" that causes stroke-like symptoms. Unlike a stroke, a TIA does not cause permanent damage to the brain. The symptoms of a TIA can happen very fast and do not last long. It is important to know the symptoms of a TIA and what to do. This can help prevent a major stroke or death. °CAUSES  °A TIA is caused by a temporary blockage in an artery in the brain or neck (carotid artery). The blockage does not allow the brain to get the blood supply it needs and can cause different symptoms. The blockage can be caused by either: °· A blood clot. °· Fatty buildup (plaque) in a neck or brain artery. °RISK FACTORS °· High blood pressure (hypertension). °· High cholesterol. °· Diabetes mellitus. °· Heart disease. °· The buildup of plaque in the blood vessels (peripheral artery disease or atherosclerosis). °· The buildup of plaque in the blood vessels that provide blood and oxygen to the brain (carotid artery stenosis). °· An abnormal heart rhythm (atrial fibrillation). °· Obesity. °· Using any tobacco products, including cigarettes, chewing tobacco, or electronic cigarettes. °· Taking oral contraceptives, especially in combination with using tobacco. °· Physical inactivity. °· A diet high in fats, salt (sodium), and calories. °· Excessive alcohol use. °· Use of illegal drugs (especially cocaine and methamphetamine). °· Being male. °· Being African American. °· Being over the age of 55 years. °· Family history of stroke. °· Previous history of blood clots, stroke, TIA, or heart attack. °· Sickle cell disease. °SIGNS AND SYMPTOMS  °TIA symptoms are the same as a stroke but are temporary. These symptoms usually develop suddenly, or may be newly present upon waking from sleep: °· Sudden weakness or numbness of the face, arm, or leg, especially on one side of the body. °· Sudden trouble walking or difficulty moving arms or legs. °· Sudden  confusion. °· Sudden personality changes. °· Trouble speaking (aphasia) or understanding. °· Difficulty swallowing. °· Sudden trouble seeing in one or both eyes. °· Double vision. °· Dizziness. °· Loss of balance or coordination. °· Sudden severe headache with no known cause. °· Trouble reading or writing. °· Loss of bowel or bladder control. °· Loss of consciousness. °DIAGNOSIS  °Your health care provider may be able to determine the presence or absence of a TIA based on your symptoms, history, and physical exam. CT scan of the brain is usually performed to help identify a TIA. Other tests may include: °· Electrocardiography (ECG). °· Continuous heart monitoring. °· Echocardiography. °· Carotid ultrasonography. °· MRI. °· A scan of the brain circulation. °· Blood tests. °TREATMENT  °Since the symptoms of TIA are the same as a stroke, it is important to seek treatment as soon as possible. You may need a medicine to dissolve a blood clot (thrombolytic) if that is the cause of the TIA. This medicine cannot be given if too much time has passed. Treatment may also include:  °· Rest, oxygen, fluids through an IV tube, and medicines to thin the blood (anticoagulants). °· Measures will be taken to prevent short-term and long-term complications, including infection from breathing foreign material into the lungs (aspiration pneumonia), blood clots in the legs, and falls. °· Procedures to either remove plaque in the carotid arteries or dilate carotid arteries that have narrowed due to plaque. Those procedures are: °¨ Carotid endarterectomy. °¨ Carotid angioplasty and stenting. °· Medicines and diet may be used to address diabetes, high blood pressure, and   other underlying risk factors. °HOME CARE INSTRUCTIONS  °· Take medicines only as directed by your health care provider. Follow the directions carefully. Medicines may be used to control risk factors for a stroke. Be sure you understand all your medicine instructions. °· You  may be told to take aspirin or the anticoagulant warfarin. Warfarin needs to be taken exactly as instructed. °¨ Taking too much or too little warfarin is dangerous. Too much warfarin increases the risk of bleeding. Too little warfarin continues to allow the risk for blood clots. While taking warfarin, you will need to have regular blood tests to measure your blood clotting time. A PT blood test measures how long it takes for blood to clot. Your PT is used to calculate another value called an INR. Your PT and INR help your health care provider to adjust your dose of warfarin. The dose can change for many reasons. It is critically important that you take warfarin exactly as prescribed. °¨ Many foods, especially foods high in vitamin K can interfere with warfarin and affect the PT and INR. Foods high in vitamin K include spinach, kale, broccoli, cabbage, collard and turnip greens, Brussels sprouts, peas, cauliflower, seaweed, and parsley, as well as beef and pork liver, green tea, and soybean oil. You should eat a consistent amount of foods high in vitamin K. Avoid major changes in your diet, or notify your health care provider before changing your diet. Arrange a visit with a dietitian to answer your questions. °¨ Many medicines can interfere with warfarin and affect the PT and INR. You must tell your health care provider about any and all medicines you take; this includes all vitamins and supplements. Be especially cautious with aspirin and anti-inflammatory medicines. Do not take or discontinue any prescribed or over-the-counter medicine except on the advice of your health care provider or pharmacist. °¨ Warfarin can have side effects, such as excessive bruising or bleeding. You will need to hold pressure over cuts for longer than usual. Your health care provider or pharmacist will discuss other potential side effects. °¨ Avoid sports or activities that may cause injury or bleeding. °¨ Be careful when shaving,  flossing your teeth, or handling sharp objects. °¨ Alcohol can change the body's ability to handle warfarin. It is best to avoid alcoholic drinks or consume only very small amounts while taking warfarin. Notify your health care provider if you change your alcohol intake. °¨ Notify your dentist or other health care providers before procedures. °· Eat a diet that includes 5 or more servings of fruits and vegetables each day. This may reduce the risk of stroke. Certain diets may be prescribed to address high blood pressure, high cholesterol, diabetes, or obesity. °¨ A diet low in sodium, saturated fat, trans fat, and cholesterol is recommended to manage high blood pressure. °¨ A diet low in saturated fat, trans fat, and cholesterol, and high in fiber may control cholesterol levels. °¨ A controlled-carbohydrate, controlled-sugar diet is recommended to manage diabetes. °¨ A reduced-calorie diet that is low in sodium, saturated fat, trans fat, and cholesterol is recommended to manage obesity. °· Maintain a healthy weight. °· Stay physically active. It is recommended that you get at least 30 minutes of activity on most or all days. °· Do not use any tobacco products, including cigarettes, chewing tobacco, or electronic cigarettes. If you need help quitting, ask your health care provider. °· Limit alcohol intake to no more than 1 drink per day for nonpregnant women and 2 drinks   per day for men. One drink equals 12 ounces of beer, 5 ounces of wine, or 1½ ounces of hard liquor. °· Do not abuse drugs. °· A safe home environment is important to reduce the risk of falls. Your health care provider may arrange for specialists to evaluate your home. Having grab bars in the bedroom and bathroom is often important. Your health care provider may arrange for equipment to be used at home, such as raised toilets and a seat for the shower. °· Follow all instructions for follow-up with your health care provider. This is very important.  This includes any referrals and lab tests. Proper follow-up can prevent a stroke or another TIA from occurring. °PREVENTION  °The risk of a TIA can be decreased by appropriately treating high blood pressure, high cholesterol, diabetes, heart disease, and obesity, and by quitting smoking, limiting alcohol, and staying physically active. °SEEK MEDICAL CARE IF: °· You have personality changes. °· You have difficulty swallowing. °· You are seeing double. °· You have dizziness. °· You have a fever. °SEEK IMMEDIATE MEDICAL CARE IF:  °Any of the following symptoms may represent a serious problem that is an emergency. Do not wait to see if the symptoms will go away. Get medical help right away. Call your local emergency services (911 in [place] Do not drive yourself to the hospital. °· You have sudden weakness or numbness of the face, arm, or leg, especially on one side of the body. °· You have sudden trouble walking or difficulty moving arms or legs. °· You have sudden confusion. °· You have trouble speaking (aphasia) or understanding. °· You have sudden trouble seeing in one or both eyes. °· You have a loss of balance or coordination. °· You have a sudden, severe headache with no known cause. °· You have new chest pain or an irregular heartbeat. °· You have a partial or total loss of consciousness. °MAKE SURE YOU:  °· Understand these instructions. °· Will watch your condition. °· Will get help right away if you are not doing well or get worse. °  °This information is not intended to replace advice given to you by your health care provider. Make sure you discuss any questions you have with your health care provider. °  °Document Released: 09/29/2004 Document Revised: 01/10/2014 Document Reviewed: 03/27/2013 °[employer]vier Interactive Patient Education ©2016 [employer] ° °

## 2015-05-07 NOTE — Care Management Note (Signed)
Case Management Note  Patient Details  Name: [name] MRN: [id number] Date of Birth: 05/31/1950  Subjective/Objective:                 Presents with R sided numbness and tingling.  Action/Plan: Discharge to home today assuming care for self.  Expected Discharge Date:   05/07/2015           Expected Discharge Plan:  Home/Self Care  In-House Referral:     Discharge planning Services  CM Consult  Post Acute Care Choice:    Choice offered to:     DME Arranged:    DME Agency:     HH Arranged:    HH Agency:     Status of Service:  Completed, signed off  Medicare Important Message Given:    Date Medicare IM Given:    Medicare IM give by:    Date Additional Medicare IM Given:    Additional Medicare Important Message give by:     If discussed at Long Length of Stay Meetings, dates discussed:    Additional Comments:  Epifanio LeschesCole, Kamrie Fanton Hudson, [place],BSN,CM [phone] 05/07/2015, 2:34 PM

## 2015-05-07 NOTE — Progress Notes (Addendum)
Spoke with provider in regards to pt's insulin pump and verification of orders. Provider, Hongagli stated to put in diabetic consult.

## 2015-05-07 NOTE — Progress Notes (Signed)
Briefly spoke with patient regarding insulin pump.  He has had diabetes for approximately 60 years.  He see's Dr. Elvera LennoxGherghe.  States that CBG's range from 70-300's at times.  He is to be discharged home today.  No needs.  Patient is using insulin pump in the hospital.  Thanks, Beryl MeagerJenny Jillene Wehrenberg, RN, BC-ADM Inpatient Diabetes Coordinator Pager [phone] ([phone])

## 2015-05-07 NOTE — Progress Notes (Signed)
STROKE TEAM PROGRESS NOTE   HISTORY OF PRESENT ILLNESS [name] is an 65 y.o. male history diabetes mellitus, hypertension, hyperlipidemia, right carotid stenosis with stenting and TIA, presenting following an episode of numbness which started at 5:30 PM on 05/06/2015 with involvement of his right upper extremity. There was subsequent spread of numbness to his right lower extremity and right side of his face and neck. Speech reportedly was slightly slurred. Symptoms lasted about 1-1/2 hours then resolved spontaneously. Patient has been taking aspirin and Plavix daily. CT scan of his head showed no acute abnormality. CT angiogram was unremarkable except for its atherosclerotic calcifications in the right and left cavernous ICAs. Stent have right carotid bifurcation was patent. NIH stroke score at the time of this evaluation was 0. Patient was not administered IV t-PA secondary to deficits resolved. He was admitted for further evaluation and treatment.   SUBJECTIVE (INTERVAL HISTORY) No family is at the bedside.  He recounted his HPI and PMH with Dr. Pearlean Brownie in detail.  They discussed antiplatelets, preferences.    OBJECTIVE Temp:  [97.3 F (36.3 C)-98 F (36.7 C)] 97.3 F (36.3 C) (05/04 0800) Pulse Rate:  [71-84] 73 (05/04 0800) Cardiac Rhythm:  [-] Normal sinus rhythm (05/04 0701) Resp:  [13-18] 17 (05/04 0800) BP: (121-157)/(56-75) 132/74 mmHg (05/04 0800) SpO2:  [95 %-100 %] 97 % (05/04 0800) Weight:  [96.163 kg (212 lb)] 96.163 kg (212 lb) (05/03 1920)  CBC:   Recent Labs Lab 05/06/15 1927  WBC 6.6  NEUTROABS 4.2  HGB 13.9  HCT 39.4  MCV 90.6  PLT 232    Basic Metabolic Panel:   Recent Labs Lab 05/06/15 1927  NA 137  K 4.7  CL 103  CO2 28  GLUCOSE 205*  BUN 18  CREATININE 1.00  CALCIUM 8.7*    Lipid Panel:     Component Value Date/Time   CHOL 268* 05/07/2015 0557   TRIG 74 05/07/2015 0557   HDL 67 05/07/2015 0557   CHOLHDL 4.0 05/07/2015 0557   VLDL  15 05/07/2015 0557   LDLCALC 186* 05/07/2015 0557   HgbA1c:  Lab Results  Component Value Date   HGBA1C 8.9 03/09/2015   Urine Drug Screen:     Component Value Date/Time   LABOPIA NONE DETECTED 05/06/2015 2045   COCAINSCRNUR NONE DETECTED 05/06/2015 2045   LABBENZ NONE DETECTED 05/06/2015 2045   AMPHETMU NONE DETECTED 05/06/2015 2045   THCU NONE DETECTED 05/06/2015 2045   LABBARB NONE DETECTED 05/06/2015 2045      IMAGING  Ct Angio Head W/cm &/or Wo Cm  05/06/2015  CLINICAL DATA:  New onset of right upper and lower extremity numbness beginning at 5 p.m. tonight. Dizziness since 10 a.m. today. Disequilibrium. EXAM: CT ANGIOGRAPHY HEAD AND NECK TECHNIQUE: Multidetector CT imaging of the head and neck was performed using the standard protocol during bolus administration of intravenous contrast. Multiplanar CT image reconstructions and MIPs were obtained to evaluate the vascular anatomy. Carotid stenosis measurements (when applicable) are obtained utilizing NASCET criteria, using the distal internal carotid diameter as the denominator. CONTRAST:  100 mL Isovue 370 COMPARISON:  CTA of the neck 01/13/2014.  MRI brain 01/13/2014. FINDINGS: CT HEAD Brain: No acute cortical infarct or hemorrhage is present. The basal ganglia are intact. The insular ribbon is normal. The ventricles are proportionate to a minimal atrophy. No significant extra-axial fluid collection is present. Vascular calcifications are present within the cavernous internal carotid arteries bilaterally. Calvarium and skull base: Within normal limits.  Paranasal sinuses: Clear Orbits: High-density is again noted in the right globe. The a left lens replacement is present. The orbits are otherwise unremarkable. CTA NECK Aortic arch: A 3 vessel arch configuration is present. There is no significant stenosis at the aortic arch. Right carotid system: The right common carotid artery is within normal limits. There are carotid stent is in place.  The stent is narrowed in the AP diameter to approximately 3 mm. The more distal cervical internal carotid artery is intact Left carotid system: The left common carotid artery is within normal limits. Atherosclerotic calcifications are again noted at the carotid bifurcation. The lumen is narrowed to 3 mm, similar to the prior study. This compares to a more distal vessel caliber of 4.5 mm. Vertebral arteries:Both vertebral arteries originate from the subclavian arteries. Vertebral arteries are codominant. There is no significant stenosis of either vertebral artery. Skeleton: Vertebral body heights alignment are maintained. The superior endplate Schmorl's node at C7 is stable. Vertebral body heights and alignment are maintained. Other neck: No focal mucosal or submucosal lesions are present. The tongue base is within normal limits. Vocal cords are midline and symmetric. The submandibular and parotid glands are within normal limits bilaterally. There is no significant adenopathy. The thyroid is unremarkable. CTA HEAD Anterior circulation: Dense atherosclerotic calcifications are present within the cavernous internal carotid arteries bilaterally without focal stenosis. The ICA terminus is normal bilaterally. The A1 and M1 segments are unremarkable. The anterior communicating artery is patent. The MCA bifurcations are intact. ACA and MCA branch vessels are unremarkable. Posterior circulation: The vertebral arteries are codominant. The right PICA origin is extradural. The left AICA is dominant. The vertebrobasilar junction is within normal limits. The basilar artery is normal. Both posterior cerebral arteries originate from the basilar tip. The PCA branch vessels are intact. Venous sinuses: The dural sinuses are patent. The right transverse sinus is dominant. The straight sinus and deep cerebral veins are intact. The cortical veins are patent. Anatomic variants: None Delayed phase: The postcontrast images demonstrate no  pathologic enhancement. IMPRESSION: 1. Interval placement of stent across the right carotid bifurcation. The stent appears patent. The distal right ICA demonstrates a slightly larger diameter than on the prior exam, suggesting improved flow. 2. Stable atherosclerotic disease at the left carotid bifurcation without a significant stenosis relative to the more distal vessel. 3. Dense atherosclerotic calcifications within the cavernous internal carotid arteries bilaterally without significant stenosis or change. 4. No significant atherosclerotic calcification or stenosis involving the posterior circulation. 5. ACA and MCA branch vessels are within normal limits. Electronically Signed   By: Marin Roberts M.D.   On: 05/06/2015 21:47   Ct Angio Neck W/cm &/or Wo/cm  05/06/2015  CLINICAL DATA:  New onset of right upper and lower extremity numbness beginning at 5 p.m. tonight. Dizziness since 10 a.m. today. Disequilibrium. EXAM: CT ANGIOGRAPHY HEAD AND NECK TECHNIQUE: Multidetector CT imaging of the head and neck was performed using the standard protocol during bolus administration of intravenous contrast. Multiplanar CT image reconstructions and MIPs were obtained to evaluate the vascular anatomy. Carotid stenosis measurements (when applicable) are obtained utilizing NASCET criteria, using the distal internal carotid diameter as the denominator. CONTRAST:  100 mL Isovue 370 COMPARISON:  CTA of the neck 01/13/2014.  MRI brain 01/13/2014. FINDINGS: CT HEAD Brain: No acute cortical infarct or hemorrhage is present. The basal ganglia are intact. The insular ribbon is normal. The ventricles are proportionate to a minimal atrophy. No significant extra-axial fluid collection is present.  Vascular calcifications are present within the cavernous internal carotid arteries bilaterally. Calvarium and skull base: Within normal limits. Paranasal sinuses: Clear Orbits: High-density is again noted in the right globe. The a left  lens replacement is present. The orbits are otherwise unremarkable. CTA NECK Aortic arch: A 3 vessel arch configuration is present. There is no significant stenosis at the aortic arch. Right carotid system: The right common carotid artery is within normal limits. There are carotid stent is in place. The stent is narrowed in the AP diameter to approximately 3 mm. The more distal cervical internal carotid artery is intact Left carotid system: The left common carotid artery is within normal limits. Atherosclerotic calcifications are again noted at the carotid bifurcation. The lumen is narrowed to 3 mm, similar to the prior study. This compares to a more distal vessel caliber of 4.5 mm. Vertebral arteries:Both vertebral arteries originate from the subclavian arteries. Vertebral arteries are codominant. There is no significant stenosis of either vertebral artery. Skeleton: Vertebral body heights alignment are maintained. The superior endplate Schmorl's node at C7 is stable. Vertebral body heights and alignment are maintained. Other neck: No focal mucosal or submucosal lesions are present. The tongue base is within normal limits. Vocal cords are midline and symmetric. The submandibular and parotid glands are within normal limits bilaterally. There is no significant adenopathy. The thyroid is unremarkable. CTA HEAD Anterior circulation: Dense atherosclerotic calcifications are present within the cavernous internal carotid arteries bilaterally without focal stenosis. The ICA terminus is normal bilaterally. The A1 and M1 segments are unremarkable. The anterior communicating artery is patent. The MCA bifurcations are intact. ACA and MCA branch vessels are unremarkable. Posterior circulation: The vertebral arteries are codominant. The right PICA origin is extradural. The left AICA is dominant. The vertebrobasilar junction is within normal limits. The basilar artery is normal. Both posterior cerebral arteries originate from the  basilar tip. The PCA branch vessels are intact. Venous sinuses: The dural sinuses are patent. The right transverse sinus is dominant. The straight sinus and deep cerebral veins are intact. The cortical veins are patent. Anatomic variants: None Delayed phase: The postcontrast images demonstrate no pathologic enhancement. IMPRESSION: 1. Interval placement of stent across the right carotid bifurcation. The stent appears patent. The distal right ICA demonstrates a slightly larger diameter than on the prior exam, suggesting improved flow. 2. Stable atherosclerotic disease at the left carotid bifurcation without a significant stenosis relative to the more distal vessel. 3. Dense atherosclerotic calcifications within the cavernous internal carotid arteries bilaterally without significant stenosis or change. 4. No significant atherosclerotic calcification or stenosis involving the posterior circulation. 5. ACA and MCA branch vessels are within normal limits. Electronically Signed   By: Marin Roberts M.D.   On: 05/06/2015 21:47   Mr Brain Wo Contrast  05/07/2015  CLINICAL DATA:  Transient right-sided numbness. EXAM: MRI HEAD WITHOUT CONTRAST TECHNIQUE: Multiplanar, multiecho pulse sequences of the brain and surrounding structures were obtained without intravenous contrast. COMPARISON:  CT head without contrast 05/06/2015. MRI brain 01/13/2014. FINDINGS: The diffusion-weighted images demonstrate no evidence for acute or subacute infarction. The ventricles are of normal size. No acute hemorrhage or mass lesion is present. There is no significant white matter disease. No significant extraaxial fluid collection is present. The internal auditory canals are within normal limits bilaterally. Flow is present in the major intracranial arteries. 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 The orbits are otherwise unremarkable. Mild mucosal thickening is present in the ethmoid air cells bilaterally. No  fluid levels  are present. The mastoid air cells are clear. Skullbase is within normal limits. Midline sagittal images are unremarkable. IMPRESSION: 1. Normal MRI the brain for age. 2. Stable chronic changes in the vitreous of the right globe. Electronically Signed   By: Marin Robertshristopher  Mattern M.D.   On: 05/07/2015 08:17    PHYSICAL EXAM Pleasant elderly Caucasian male currently not in distress. . Afebrile. Head is nontraumatic. Neck is supple without bruit.    Cardiac exam no murmur or gallop. Lungs are clear to auscultation. Distal pulses are well felt. Neurological Exam ;  Awake  Alert oriented x 3. Normal speech and language.eye movements full without nystagmus.fundi were not visualized. Vision acuity and fields appear normal. Hearing is normal. Palatal movements are normal. Face symmetric. Tongue midline. Normal strength, tone, reflexes and coordination. Normal sensation. Gait deferred. ASSESSMENT/PLAN [name] is a 65 y.o. male with history of HTN, DM, HLD, TIA d/t carotid stenosis, AS  presenting with right sided numbness. He did not receive IV t-PA due to deficits resolved.   L brain TIA   Resultant  Neurologic deficits resolved  MRI  Negative  CTA head  ACA and MCA branches normal. No significant calcification or stenosis posterior circulation  CTA neck  Known right carotid bifurcation stent patent with distal right ICA larger than previously.  2D Echo  EF 60-65%, no source of embolous  LDL 186  HgbA1c 8.9 in March   Lovenox 40 mg sq daily for VTE prophylaxis Diet Carb Modified Fluid consistency:: Thin; Room service appropriate?: Yes  aspirin 81 mg daily and clopidogrel 75 mg daily prior to admission, ok to stop the aspirin from stroke standpoint. For now, continue on both aspirin 81 mg daily and clopidogrel 75 mg daily. Patient to folllow up with Dr. Allyson SabalBerry for his input.  Patient counseled to be compliant with his antithrombotic medications  Ongoing aggressive stroke risk  factor management  Therapy recommendations:   no therapy needs  Disposition:  Return home   [facility]  Follow up Dr. Pearlean BrownieSEthi in 2 mos  Hypertension  Stable  Hyperlipidemia  Home meds:  No statin  LDL 186, goal < 70  Intolerant to statins  Diabetes type I Diabetic retinopathy  HgbA1c 8.9 in March, goal < 7.0  Uncontrolled  Insulin pump  Other Stroke Risk Factors  Advanced age  Former Cigarette smoker, quit smoking around 1990 years ago   ETOH use  Overweight, Body mass index is 27.21 kg/(m^2).   Coronary artery disease  Obstructive sleep apnea, on CPAP at home  Aortic stenosis  R carotid stenosis s/p stent  Other Active Problems  Hypothyroidism,on Synthroid  Diastolic dysfunction  GERD  Hospital day #   Rhoderick MoodyBIBY,SHARON  [facility] See Amion for Pager information 05/07/2015 11:30 AM  I have personally examined this patient, reviewed notes, independently viewed imaging studies, participated in medical decision making and plan of care. I have made any additions or clarifications directly to the above note. Agree with note above. he presented with transient episode of right upper and lower extremity numbness lasting 1-1-1/2 hours due to left brain TIA.  He remains at risk for neurological worsening, recurrent stroke, TIA and needs  Ongoing stroke evaluation and aggressive risk factor modification. Continue on aspirin and Plavix for now but  discuss with his cardiologist if he can stop one of them as I do not feel there is a neurological indication for dual antiplatelet therapy.She was  advised to maintain aggressive control of blood pressure gets systolic blood pressure goal below 130 and lipids with LDL cholesterol goal below 70 mg percent and diabetes with hemoglobin A1c goal below 7.  He was advised to follow-up with me in the stroke clinic in 2 months.Greater than 50% time during this 25 minute visit was spent on  counseling and coordination of care about stroke risk and stroke prevention.Discussed with Dr.: Gust Rung, MD Medical Director [facility] Pager: [phone] 05/07/2015 3:41 PM    To contact Stroke Continuity provider, please refer to [email or web address]. After hours, contact General Neurology

## 2015-05-07 NOTE — Consult Note (Signed)
Admission H&P    Chief Complaint: Transient right side numbness.  HPI: ICHOLAS Carter is an 65 y.o. male history diabetes mellitus, hypertension, hyperlipidemia, right carotid stenosis with stenting and TIA, presenting following an episode of numbness which started at 5:30 PM with involvement of his right upper extremity. There was subsequent spread of numbness to his right lower extremity and right side of his face and neck. Speech reportedly was slightly slurred. Symptoms lasted about 1-1/2 hours then resolved spontaneously. Patient has been taking aspirin and Plavix daily. CT scan of his head showed no acute abnormality. CT angiogram was unremarkable except for its atherosclerotic calcifications in the right and left cavernous ICAs. Stent have right carotid bifurcation was patent. NIH stroke score at the time of this evaluation was 0.  LSN: 5:30 PM on 05/06/2015 tPA Given: No: Deficits resolved mRankin:  Past Medical History  Diagnosis Date  . Diabetes mellitus Dx'd age 35    Novolog via insulin pump; sub-optimal control x years  . High cholesterol     Takes 1/2 of 73m atorvastatin daily  . Hypothyroidism     Thyroidectomy 2002; multinodular goiter  . Erectile dysfunction     Se[place] urologist in [place]  . Diabetic retinopathy     Proliferative: Hx of retinal detachment on right-light perception only in right eye  . History of tobacco abuse     Quit about 1990 (has 35 pack-yr hx)  . OSA (obstructive sleep apnea)     06/2011 sleep study: moderate OSA, CPAP at 12 cm H2O.  . Impaired vision     right eye light perception only; hx of retinal detachment.  . Recurrent pneumonia   . GERD (gastroesophageal reflux disease)   . Venous insufficiency   . Diastolic dysfunction 20947 2016    TEE with diastolic dysfunction, EF 709%  . Diabetic neuropathy (HCC)     fine touch and position sense affected  . Aortic stenosis, moderate Sept/Oct /2013    Mild/mod by echo; mild by cath 10/28/11.  Mild  progression by echo 05/2012.  Further mild progression on 01/2014 echo (valve area 0.93 cm2)  . Carotid stenosis, right 09/2011    Dr. BGwenlyn Founddid R carotid stenting 01/2014  . 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  Med mgmt recommended.  . Abnormal stress test 10/27/2011    lat isch--cardiac cath 10/28/2011  . Venous reflux 10/14/2011    venous doppler-R GSV continuous reflux throughout; too small for VNUS closure  . Decreased pedal pulses     LE doppler 11/08/11- no evidence of arterial insufficiency  . TIA (transient ischemic attack) 2015/16    with R carotid dz: pt got R carotid stent 01/2014 by Dr. BGwenlyn Foundand was put on dual antiplatelet therapy with ASA 3234mand plavix 7564md afterwards  . Osteoarthritis     Bilat thumb carpometacarpal joints.  . Impingement syndrome of right shoulder     Past Surgical History  Procedure Laterality Date  . Thyroid surgery  2002  . Lasix eye    . Retinal detachment surgery      Right eye  . Cataract extraction      left  . Transthoracic echocardiogram  10/2011;12/2012    Mod AS, mild LVH, EF 60-65%, no RWMA-being followed by Dr. BerGwenlyn Found Cardiac catheterization  10/2011    EF normal.  Diffuse 3 vessel CAD, no stents  placed.  Medical mgmt per [facility].  [name] Left and right heart catheterization with coronary angiogram N/A 10/28/2011    Procedure: LEFT AND R0IHT HEART CATHETERIZATION WITH CORONARY ANGIOGRAM;  Surgeon: Troy Sine, MD;  Location: [facility] CATH LAB;  Service: Cardiovascular;  Laterality: N/A;  . Carotid stent insertion Right 01/16/2014    Procedure: CAROTID STENT INSERTION;  Surgeon: Lorretta Harp, MD;  Location: [facility] CATH LAB;  Service: Cardiovascular;  Laterality: Right;  . Eye surgery      Multiple laser surgeries for diabetic retinopathy, also cataract surgery OU.    Family History  Problem Relation Age of Onset  . Cancer Mother      lung cancer  . Alcohol abuse Father   . Cancer Father     laryngeal cancer  . Cancer Brother     oldest brother had lung cancer and melanoma   Social History:  reports that he has quit smoking. He quit smokeless tobacco use about 27 years ago. He reports that he drinks alcohol. He reports that he does not use illicit drugs.  Allergies:  Allergies  Allergen Reactions  . Statins Other (See Comments)    Problem with higher dosages (Lipitor caused memory issues), also caused leg pains and lethargy  . Tape Rash    Adhesive tape.  (Paper tape OK)    Medications Prior to Admission  Medication Sig Dispense Refill  . albuterol (VENTOLIN HFA) 108 (90 BASE) MCG/ACT inhaler Inhale 1-2 puffs into the lungs every 4 (four) hours as needed for wheezing or shortness of breath. 1 Inhaler 0  . aspirin 81 MG tablet Take 81 mg by mouth daily.    . clopidogrel (PLAVIX) 75 MG tablet Take 1 tablet (75 mg total) by mouth daily. 90 tablet 3  . clotrimazole-betamethasone (LOTRISONE) cream Apply 1 application topically daily as needed (RASH). 45 g 2  . Feverfew 380 MG CAPS Take 380 mg by mouth daily. For migraine headaches    . gabapentin (NEURONTIN) 300 MG capsule 1 tab po tid 90 capsule 6  . HYDROcodone-homatropine (HYCODAN) 5-1.5 MG/5ML syrup 1-2 tsp po qhs prn cough 120 mL 0  . insulin aspart (NOVOLOG) 100 UNIT/ML injection Use up to 60 units in the insulin pump per day 6 vial 3  . Insulin Human (INSULIN PUMP) SOLN Inject into the skin continuous. Basal rate .95/hr, current pump settings: 12am .45, 2am .65, 7am .95, 11am .95, 5pm .75, 11pm .5    . levothyroxine (SYNTHROID, LEVOTHROID) 137 MCG tablet Take 1 tablet (137 mcg total) by mouth daily. 90 tablet 1  . Omega-3 Fatty Acids (FISH OIL) 1000 MG CAPS Take 1,000 mg by mouth daily.    . ranitidine (ZANTAC) 150 MG tablet Take 150 mg by mouth daily as needed for heartburn.    . tadalafil (CIALIS) 20 MG tablet Take 1 tablet (20 mg total) by mouth daily as  needed for erectile dysfunction. 10 tablet 1  . [DISCONTINUED] oseltamivir (TAMIFLU) 75 MG capsule Take 1 capsule (75 mg total) by mouth 2 (two) times daily. 10 capsule 0    ROS: History obtained from the patient  General ROS: negative for - chills, fatigue, fever, night sweats, weight gain or weight loss Psychological ROS: negative for - behavioral disorder, hallucinations, memory difficulties, mood swings or suicidal ideation Ophthalmic ROS: negative for - blurry vision, double vision, eye pain or loss of vision ENT ROS: Positive for occasional episodes of vertigo, last of which occurred at around 12:30 on 05/06/2015 Allergy  and Immunology ROS: negative for - hives or itchy/watery eyes Hematological and Lymphatic ROS: negative for - bleeding problems, bruising or swollen lymph nodes Endocrine ROS: negative for - galactorrhea, hair pattern changes, polydipsia/polyuria or temperature intolerance Respiratory ROS: negative for - cough, hemoptysis, shortness of breath or wheezing Cardiovascular ROS: negative for - chest pain, dyspnea on exertion, edema or irregular heartbeat Gastrointestinal ROS: negative for - abdominal pain, diarrhea, hematemesis, nausea/vomiting or stool incontinence Genito-Urinary ROS: negative for - dysuria, hematuria, incontinence or urinary frequency/urgency Musculoskeletal ROS: negative for - joint swelling or muscular weakness Neurological ROS: as noted in HPI Dermatological ROS: negative for rash and skin lesion changes  Physical Examination: Blood pressure 152/72, pulse 76, temperature 97.6 F (36.4 C), temperature source Oral, resp. rate 17, height 6' 2"  (1.88 m), weight 96.163 kg (212 lb), SpO2 95 %.  HEENT-  Normocephalic, no lesions, without obvious abnormality.  Normal external eye and conjunctiva.  Normal TM's bilaterally.  Normal auditory canals and external ears. Normal external nose, mucus membranes and septum.  Normal pharynx. Neck supple with no masses,  nodes, nodules or enlargement. Cardiovascular - regular rate and rhythm, S1, S2 normal, no murmur, click, rub or gallop Lungs - chest clear, no wheezing, rales, normal symmetric air entry Abdomen - soft, non-tender; bowel sounds normal; no masses,  no organomegaly Extremities - no joint deformities, effusion, or inflammation and no edema  Neurologic Examination: Mental Status: Alert, oriented, thought content appropriate.  Speech fluent without evidence of aphasia. Able to follow commands without difficulty. Cranial Nerves: II-Visual fields were normal with testing of left eye. There was severe loss of vision involving right eye. Patient was only able to detect movement. III/IV/VI-left pupil reacted normally to light. Right pupil did not react to light.. Mild to moderate right exotropia is noted..    V/VII-no facial numbness and no facial weakness. VIII-normal. X-normal speech and symmetrical palatal movement. XI: trapezius strength/neck flexion strength normal bilaterally XII-midline tongue extension with normal strength. Motor: 5/5 bilaterally with normal tone and bulk, except for moderate intrinsic hand muscle atrophy and old weakness bilaterally. Sensory: Stocking distribution loss of sensation to all modalities her extremities distally. Deep Tendon Reflexes: Trace to 1+ and symmetric in upper extremities and absent in lower extremities. Plantars: Mute bilaterally Cerebellar: Normal finger-to-nose testing. Carotid auscultation: Normal  Results for orders placed or performed during the hospital encounter of 05/06/15 (from the past 48 hour(s))  CBG monitoring, ED     Status: Abnormal   Collection Time: 05/06/15  7:26 PM  Result Value Ref Range   Glucose-Capillary 200 (H) 65 - 99 mg/dL   Comment 1 Document in Chart   Ethanol     Status: None   Collection Time: 05/06/15  7:27 PM  Result Value Ref Range   Alcohol, Ethyl (B) <5 <5 mg/dL    Comment:        LOWEST DETECTABLE LIMIT  FOR SERUM ALCOHOL IS 5 mg/dL FOR MEDICAL PURPOSES ONLY   Protime-INR     Status: None   Collection Time: 05/06/15  7:27 PM  Result Value Ref Range   Prothrombin Time 12.7 11.6 - 15.2 seconds   INR 0.93 0.00 - 1.49  APTT     Status: None   Collection Time: 05/06/15  7:27 PM  Result Value Ref Range   aPTT 29 24 - 37 seconds  CBC     Status: None   Collection Time: 05/06/15  7:27 PM  Result Value Ref Range   WBC 6.6  4.0 - 10.5 K/uL   RBC 4.35 4.22 - 5.81 MIL/uL   Hemoglobin 13.9 13.0 - 17.0 g/dL   HCT 39.4 39.0 - 52.0 %   MCV 90.6 78.0 - 100.0 fL   MCH 32.0 26.0 - 34.0 pg   MCHC 35.3 30.0 - 36.0 g/dL   RDW 13.2 11.5 - 15.5 %   Platelets 232 150 - 400 K/uL  Differential     Status: None   Collection Time: 05/06/15  7:27 PM  Result Value Ref Range   Neutrophils Relative % 64 %   Neutro Abs 4.2 1.7 - 7.7 K/uL   Lymphocytes Relative 24 %   Lymphs Abs 1.6 0.7 - 4.0 K/uL   Monocytes Relative 9 %   Monocytes Absolute 0.6 0.1 - 1.0 K/uL   Eosinophils Relative 3 %   Eosinophils Absolute 0.2 0.0 - 0.7 K/uL   Basophils Relative 0 %   Basophils Absolute 0.0 0.0 - 0.1 K/uL  Comprehensive metabolic panel     Status: Abnormal   Collection Time: 05/06/15  7:27 PM  Result Value Ref Range   Sodium 137 135 - 145 mmol/L   Potassium 4.7 3.5 - 5.1 mmol/L   Chloride 103 101 - 111 mmol/L   CO2 28 22 - 32 mmol/L   Glucose, Bld 205 (H) 65 - 99 mg/dL   BUN 18 6 - 20 mg/dL   Creatinine, Ser 1.00 0.61 - 1.24 mg/dL   Calcium 8.7 (L) 8.9 - 10.3 mg/dL   Total Protein 6.9 6.5 - 8.1 g/dL   Albumin 3.7 3.5 - 5.0 g/dL   AST 19 15 - 41 U/L   ALT 20 17 - 63 U/L   Alkaline Phosphatase 80 38 - 126 U/L   Total Bilirubin 0.7 0.3 - 1.2 mg/dL   GFR calc non Af Amer >60 >60 mL/min   GFR calc Af Amer >60 >60 mL/min    Comment: (NOTE) The eGFR has been calculated using the CKD EPI equation. This calculation has not been validated in all clinical situations. eGFR's persistently <60 mL/min signify possible  Chronic Kidney Disease.    Anion gap 6 5 - 15  Troponin I     Status: None   Collection Time: 05/06/15  7:27 PM  Result Value Ref Range   Troponin I <0.03 <0.031 ng/mL    Comment:        NO INDICATION OF MYOCARDIAL INJURY.   Urine rapid drug screen (hosp performed)not at [facility]     Status: None   Collection Time: 05/06/15  8:45 PM  Result Value Ref Range   Opiates NONE DETECTED NONE DETECTED   Cocaine NONE DETECTED NONE DETECTED   Benzodiazepines NONE DETECTED NONE DETECTED   Amphetamines NONE DETECTED NONE DETECTED   Tetrahydrocannabinol NONE DETECTED NONE DETECTED   Barbiturates NONE DETECTED NONE DETECTED    Comment:        DRUG SCREEN FOR MEDICAL PURPOSES ONLY.  IF CONFIRMATION IS NEEDED FOR ANY PURPOSE, NOTIFY LAB WITHIN 5 DAYS.        LOWEST DETECTABLE LIMITS FOR URINE DRUG SCREEN Drug Class       Cutoff (ng/mL) Amphetamine      1000 Barbiturate      200 Benzodiazepine   944 Tricyclics       967 Opiates          300 Cocaine          300 THC  50   Urinalysis, Routine w reflex microscopic (not at [facility]     Status: Abnormal   Collection Time: 05/06/15  8:45 PM  Result Value Ref Range   Color, Urine YELLOW YELLOW   APPearance CLEAR CLEAR   Specific Gravity, Urine 1.021 1.005 - 1.030   pH 5.5 5.0 - 8.0   Glucose, UA >1000 (A) NEGATIVE mg/dL   Hgb urine dipstick NEGATIVE NEGATIVE   Bilirubin Urine NEGATIVE NEGATIVE   Ketones, ur NEGATIVE NEGATIVE mg/dL   Protein, ur NEGATIVE NEGATIVE mg/dL   Nitrite NEGATIVE NEGATIVE   Leukocytes, UA NEGATIVE NEGATIVE  Urine microscopic-add on     Status: Abnormal   Collection Time: 05/06/15  8:45 PM  Result Value Ref Range   Squamous Epithelial / LPF 0-5 (A) NONE SEEN   WBC, UA 0-5 0 - 5 WBC/hpf   RBC / HPF 0-5 0 - 5 RBC/hpf   Bacteria, UA RARE (A) NONE SEEN   Urine-Other MUCOUS PRESENT    Ct Angio Head W/cm &/or Wo Cm  05/06/2015  CLINICAL DATA:  New onset of right upper and lower extremity numbness beginning  at 5 p.m. tonight. Dizziness since 10 a.m. today. Disequilibrium. EXAM: CT ANGIOGRAPHY HEAD AND NECK TECHNIQUE: Multidetector CT imaging of the head and neck was performed using the standard protocol during bolus administration of intravenous contrast. Multiplanar CT image reconstructions and MIPs were obtained to evaluate the vascular anatomy. Carotid stenosis measurements (when applicable) are obtained utilizing NASCET criteria, using the distal internal carotid diameter as the denominator. CONTRAST:  100 mL Isovue 370 COMPARISON:  CTA of the neck 01/13/2014.  MRI brain 01/13/2014. FINDINGS: CT HEAD Brain: No acute cortical infarct or hemorrhage is present. The basal ganglia are intact. The insular ribbon is normal. The ventricles are proportionate to a minimal atrophy. No significant extra-axial fluid collection is present. Vascular calcifications are present within the cavernous internal carotid arteries bilaterally. Calvarium and skull base: Within normal limits. Paranasal sinuses: Clear Orbits: High-density is again noted in the right globe. The a left lens replacement is present. The orbits are otherwise unremarkable. CTA NECK Aortic arch: A 3 vessel arch configuration is present. There is no significant stenosis at the aortic arch. Right carotid system: The right common carotid artery is within normal limits. There are carotid stent is in place. The stent is narrowed in the AP diameter to approximately 3 mm. The more distal cervical internal carotid artery is intact Left carotid system: The left common carotid artery is within normal limits. Atherosclerotic calcifications are again noted at the carotid bifurcation. The lumen is narrowed to 3 mm, similar to the prior study. This compares to a more distal vessel caliber of 4.5 mm. Vertebral arteries:Both vertebral arteries originate from the subclavian arteries. Vertebral arteries are codominant. There is no significant stenosis of either vertebral artery.  Skeleton: Vertebral body heights alignment are maintained. The superior endplate Schmorl's node at C7 is stable. Vertebral body heights and alignment are maintained. Other neck: No focal mucosal or submucosal lesions are present. The tongue base is within normal limits. Vocal cords are midline and symmetric. The submandibular and parotid glands are within normal limits bilaterally. There is no significant adenopathy. The thyroid is unremarkable. CTA HEAD Anterior circulation: Dense atherosclerotic calcifications are present within the cavernous internal carotid arteries bilaterally without focal stenosis. The ICA terminus is normal bilaterally. The A1 and M1 segments are unremarkable. The anterior communicating artery is patent. The MCA bifurcations are intact. ACA and MCA branch vessels  are unremarkable. Posterior circulation: The vertebral arteries are codominant. The right PICA origin is extradural. The left AICA is dominant. The vertebrobasilar junction is within normal limits. The basilar artery is normal. Both posterior cerebral arteries originate from the basilar tip. The PCA branch vessels are intact. Venous sinuses: The dural sinuses are patent. The right transverse sinus is dominant. The straight sinus and deep cerebral veins are intact. The cortical veins are patent. Anatomic variants: None Delayed phase: The postcontrast images demonstrate no pathologic enhancement. IMPRESSION: 1. Interval placement of stent across the right carotid bifurcation. The stent appears patent. The distal right ICA demonstrates a slightly larger diameter than on the prior exam, suggesting improved flow. 2. Stable atherosclerotic disease at the left carotid bifurcation without a significant stenosis relative to the more distal vessel. 3. Dense atherosclerotic calcifications within the cavernous internal carotid arteries bilaterally without significant stenosis or change. 4. No significant atherosclerotic calcification or  stenosis involving the posterior circulation. 5. ACA and MCA branch vessels are within normal limits. Electronically Signed   By: San Morelle M.D.   On: 05/06/2015 21:47   Ct Angio Neck W/cm &/or Wo/cm  05/06/2015  CLINICAL DATA:  New onset of right upper and lower extremity numbness beginning at 5 p.m. tonight. Dizziness since 10 a.m. today. Disequilibrium. EXAM: CT ANGIOGRAPHY HEAD AND NECK TECHNIQUE: Multidetector CT imaging of the head and neck was performed using the standard protocol during bolus administration of intravenous contrast. Multiplanar CT image reconstructions and MIPs were obtained to evaluate the vascular anatomy. Carotid stenosis measurements (when applicable) are obtained utilizing NASCET criteria, using the distal internal carotid diameter as the denominator. CONTRAST:  100 mL Isovue 370 COMPARISON:  CTA of the neck 01/13/2014.  MRI brain 01/13/2014. FINDINGS: CT HEAD Brain: No acute cortical infarct or hemorrhage is present. The basal ganglia are intact. The insular ribbon is normal. The ventricles are proportionate to a minimal atrophy. No significant extra-axial fluid collection is present. Vascular calcifications are present within the cavernous internal carotid arteries bilaterally. Calvarium and skull base: Within normal limits. Paranasal sinuses: Clear Orbits: High-density is again noted in the right globe. The a left lens replacement is present. The orbits are otherwise unremarkable. CTA NECK Aortic arch: A 3 vessel arch configuration is present. There is no significant stenosis at the aortic arch. Right carotid system: The right common carotid artery is within normal limits. There are carotid stent is in place. The stent is narrowed in the AP diameter to approximately 3 mm. The more distal cervical internal carotid artery is intact Left carotid system: The left common carotid artery is within normal limits. Atherosclerotic calcifications are again noted at the carotid  bifurcation. The lumen is narrowed to 3 mm, similar to the prior study. This compares to a more distal vessel caliber of 4.5 mm. Vertebral arteries:Both vertebral arteries originate from the subclavian arteries. Vertebral arteries are codominant. There is no significant stenosis of either vertebral artery. Skeleton: Vertebral body heights alignment are maintained. The superior endplate Schmorl's node at C7 is stable. Vertebral body heights and alignment are maintained. Other neck: No focal mucosal or submucosal lesions are present. The tongue base is within normal limits. Vocal cords are midline and symmetric. The submandibular and parotid glands are within normal limits bilaterally. There is no significant adenopathy. The thyroid is unremarkable. CTA HEAD Anterior circulation: Dense atherosclerotic calcifications are present within the cavernous internal carotid arteries bilaterally without focal stenosis. The ICA terminus is normal bilaterally. The A1 and M1 segments  are unremarkable. The anterior communicating artery is patent. The MCA bifurcations are intact. ACA and MCA branch vessels are unremarkable. Posterior circulation: The vertebral arteries are codominant. The right PICA origin is extradural. The left AICA is dominant. The vertebrobasilar junction is within normal limits. The basilar artery is normal. Both posterior cerebral arteries originate from the basilar tip. The PCA branch vessels are intact. Venous sinuses: The dural sinuses are patent. The right transverse sinus is dominant. The straight sinus and deep cerebral veins are intact. The cortical veins are patent. Anatomic variants: None Delayed phase: The postcontrast images demonstrate no pathologic enhancement. IMPRESSION: 1. Interval placement of stent across the right carotid bifurcation. The stent appears patent. The distal right ICA demonstrates a slightly larger diameter than on the prior exam, suggesting improved flow. 2. Stable  atherosclerotic disease at the left carotid bifurcation without a significant stenosis relative to the more distal vessel. 3. Dense atherosclerotic calcifications within the cavernous internal carotid arteries bilaterally without significant stenosis or change. 4. No significant atherosclerotic calcification or stenosis involving the posterior circulation. 5. ACA and MCA branch vessels are within normal limits. Electronically Signed   By: San Morelle M.D.   On: 05/06/2015 21:47    Assessment: 65 y.o. male with multiple risk factors for stroke as well as history of TIA, presenting with probable recurrent TIA. A small vessel subcortical left cerebral infarction cannot be ruled out, however.  Stroke Risk Factors - diabetes mellitus, hyperlipidemia and hypertension  Plan: 1. HgbA1c, fasting lipid panel 2. MRI, MRA  of the brain without contrast 3. PT consult, OT consult, Speech consult 4. Echocardiogram 5. Carotid dopplers 6. Prophylactic therapy-Antiplatelet med: Aspirin and Plavix 7. Risk factor modification 8. Telemetry monitoring  C.R. Nicole Kindred, MD [facility] [phone]  05/07/2015, 1:25 AM

## 2015-05-07 NOTE — Progress Notes (Signed)
Pt admitted from [facility], transported by care link , self introduced to pt, ID bracelet checked, admission packaged given, fall prevention plan discussed with pt, call light and phone within reach and pt able to demonstrate how to use it, treatment started as prescribed

## 2015-05-07 NOTE — H&P (Signed)
History and Physical    [name] XBJ:[id number] DOB: July 12, 1950 DOA: 05/06/2015  Referring MD/NP/PA: Dr. Criss Alvine PCP: [name], MD Outpatient Specialists: None Patient coming from: [facility]  Chief Complaint: R sided numbness and tingling  HPI: [name] is a 65 y.o. male with medical history significant of prior TIA on R side, subsequent R carotid stenting, DM1 since the 1960s.  Patient presents to the ED with c/o R sided numbness and tingling.  Symptoms onset about 8 hours PTA and were persistent.  He also had some R sided weakness with walking as well.  ED Course: CTA head and neck are unremarkable.  Patient transferred to [facility] for MRI brain and neuro consult.  Review of Systems: As per HPI otherwise 10 point review of systems negative.    Past Medical History  Diagnosis Date  . Diabetes mellitus Dx'd age 33    Novolog via insulin pump; sub-optimal control x years  . High cholesterol     Takes 1/2 of 80mg  atorvastatin daily  . Hypothyroidism     Thyroidectomy 2002; multinodular goiter  . Erectile dysfunction     Se[place] urologist in [place]  . Diabetic retinopathy     Proliferative: Hx of retinal detachment on right-light perception only in right eye  . History of tobacco abuse     Quit about 1990 (has 35 pack-yr hx)  . OSA (obstructive sleep apnea)     06/2011 sleep study: moderate OSA, CPAP at 12 cm H2O.  . Impaired vision     right eye light perception only; hx of retinal detachment.  . Recurrent pneumonia   . GERD (gastroesophageal reflux disease)   . Venous insufficiency   . Diastolic dysfunction 2007; 2016    TEE with diastolic dysfunction, EF 74%.  . Diabetic neuropathy (HCC)     fine touch and position sense affected  . Aortic stenosis, moderate Sept/Oct /2013    Mild/mod by echo; mild by cath 10/28/11.  Mild progression by echo 05/2012.  Further mild progression on 01/2014 echo (valve area 0.93 cm2)  . Carotid stenosis, right 09/2011    Dr. [name] did R  carotid stenting 01/2014  . 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  Med mgmt recommended.  . Abnormal stress test 10/27/2011    lat isch--cardiac cath 10/28/2011  . Venous reflux 10/14/2011    venous doppler-R GSV continuous reflux throughout; too small for VNUS closure  . Decreased pedal pulses     LE doppler 11/08/11- no evidence of arterial insufficiency  . TIA (transient ischemic attack) 2015/16    with R carotid dz: pt got R carotid stent 01/2014 by Dr. [name] and was put on dual antiplatelet therapy with ASA 325mg  and plavix 75mg  qd afterwards  . Osteoarthritis     Bilat thumb carpometacarpal joints.  . Impingement syndrome of right shoulder     Past Surgical History  Procedure Laterality Date  . Thyroid surgery  2002  . Lasix eye    . Retinal detachment surgery      Right eye  . Cataract extraction      left  . Transthoracic echocardiogram  10/2011;12/2012    Mod AS, mild LVH, EF 60-65%, no RWMA-being followed by Dr. [name]  . Cardiac catheterization  10/2011    EF normal.  Diffuse 3 vessel CAD, no stents placed.  Medical mgmt per [facility]  [name]  Left and right heart catheterization with coronary angiogram N/A 10/28/2011    Procedure: LEFT AND R0IHT HEART CATHETERIZATION WITH CORONARY ANGIOGRAM;  Surgeon: Lennette Bihari, MD;  Location: [facility] CATH LAB;  Service: Cardiovascular;  Laterality: N/A;  . Carotid stent insertion Right 01/16/2014    Procedure: CAROTID STENT INSERTION;  Surgeon: Runell Gess, MD;  Location: [facility] CATH LAB;  Service: Cardiovascular;  Laterality: Right;  . Eye surgery      Multiple laser surgeries for diabetic retinopathy, also cataract surgery OU.     reports that he has quit smoking. He quit smokeless tobacco use about 27 years ago. He reports that he drinks alcohol. He reports that he does not use illicit drugs.  Allergies  Allergen Reactions  .  Statins Other (See Comments)    Problem with higher dosages (Lipitor caused memory issues), also caused leg pains and lethargy  . Tape Rash    Adhesive tape.  (Paper tape OK)    Family History  Problem Relation Age of Onset  . Cancer Mother     lung cancer  . Alcohol abuse Father   . Cancer Father     laryngeal cancer  . Cancer Brother     oldest brother had lung cancer and melanoma     Prior to Admission medications   Medication Sig Start Date End Date Taking? Authorizing Provider  albuterol (VENTOLIN HFA) 108 (90 BASE) MCG/ACT inhaler Inhale 1-2 puffs into the lungs every 4 (four) hours as needed for wheezing or shortness of breath. 04/23/14   [name], MD  aspirin 81 MG tablet Take 81 mg by mouth daily.    Historical Provider, MD  clopidogrel (PLAVIX) 75 MG tablet Take 1 tablet (75 mg total) by mouth daily. 03/16/15   Runell Gess, MD  clotrimazole-betamethasone (LOTRISONE) cream Apply 1 application topically daily as needed (RASH). 03/17/15   [name], MD  Feverfew 380 MG CAPS Take 380 mg by mouth daily. For migraine headaches    Historical Provider, MD  gabapentin (NEURONTIN) 300 MG capsule 1 tab po tid 04/16/14   [name], MD  HYDROcodone-homatropine [facility]) 5-1.5 MG/5ML syrup 1-2 tsp po qhs prn cough 02/27/15   [name], MD  insulin aspart (NOVOLOG) 100 UNIT/ML injection Use up to 60 units in the insulin pump per day 03/09/15   Carlus Pavlov, MD  Insulin Human (INSULIN PUMP) SOLN Inject into the skin continuous. Basal rate .95/hr, current pump settings: 12am .45, 2am .65, 7am .95, 11am .95, 5pm .75, 11pm .5    Historical Provider, MD  levothyroxine (SYNTHROID, LEVOTHROID) 137 MCG tablet Take 1 tablet (137 mcg total) by mouth daily. 07/17/14   [name], MD  Omega-3 Fatty Acids (FISH OIL) 1000 MG CAPS Take 1,000 mg by mouth daily.    Historical Provider, MD  ranitidine (ZANTAC) 150 MG tablet Take 150 mg by mouth daily as needed for  heartburn.    Historical Provider, MD  tadalafil (CIALIS) 20 MG tablet Take 1 tablet (20 mg total) by mouth daily as needed for erectile dysfunction. 10/03/14   Carlus Pavlov, MD    Physical Exam: Filed Vitals:   05/06/15 2230 05/06/15 2300 05/06/15 2330 05/07/15 0027  BP: 140/70 134/74 138/73 152/72  Pulse: 75 76 79 76  Temp:    97.6 F (36.4 C)  TempSrc:    Oral  Resp: 17 14 18 17   Height:      Weight:      SpO2:  99% 99% 99% 95%      Constitutional: NAD, calm, comfortable Filed Vitals:   05/06/15 2230 05/06/15 2300 05/06/15 2330 05/07/15 0027  BP: 140/70 134/74 138/73 152/72  Pulse: 75 76 79 76  Temp:    97.6 F (36.4 C)  TempSrc:    Oral  Resp: 17 14 18 17   Height:      Weight:      SpO2: 99% 99% 99% 95%   Eyes: PERRL, lids and conjunctivae normal ENMT: Mucous membranes are moist. Posterior pharynx clear of any exudate or lesions.Normal dentition.  Neck: normal, supple, no masses, no thyromegaly Respiratory: clear to auscultation bilaterally, no wheezing, no crackles. Normal respiratory effort. No accessory muscle use.  Cardiovascular: Regular rate and rhythm, no murmurs / rubs / gallops. No extremity edema. 2+ pedal pulses. No carotid bruits.  Abdomen: no tenderness, no masses palpated. No hepatosplenomegaly. Bowel sounds positive.  Musculoskeletal: no clubbing / cyanosis. No joint deformity upper and lower extremities. Good ROM, no contractures. Normal muscle tone.  Skin: no rashes, lesions, ulcers. No induration Neurologic: CN 2-12 grossly intact. Sensation intact, DTR normal. Strength 5/5 in all 4.  Normal gait but patient feels unsteady. Psychiatric: Normal judgment and insight. Alert and oriented x 3. Normal mood.    Labs on Admission: I have personally reviewed following labs and imaging studies  CBC:  Recent Labs Lab 05/06/15 1927  WBC 6.6  NEUTROABS 4.2  HGB 13.9  HCT 39.4  MCV 90.6  PLT 232   Basic Metabolic Panel:  Recent Labs Lab  05/06/15 1927  NA 137  K 4.7  CL 103  CO2 28  GLUCOSE 205*  BUN 18  CREATININE 1.00  CALCIUM 8.7*   GFR: Estimated Creatinine Clearance: 84.5 mL/min (by C-G formula based on Cr of 1). Liver Function Tests:  Recent Labs Lab 05/06/15 1927  AST 19  ALT 20  ALKPHOS 80  BILITOT 0.7  PROT 6.9  ALBUMIN 3.7   No results for input(s): LIPASE, AMYLASE in the last 168 hours. No results for input(s): AMMONIA in the last 168 hours. Coagulation Profile:  Recent Labs Lab 05/06/15 1927  INR 0.93   Cardiac Enzymes:  Recent Labs Lab 05/06/15 1927  TROPONINI <0.03   BNP (last 3 results) No results for input(s): PROBNP in the last 8760 hours. HbA1C: No results for input(s): HGBA1C in the last 72 hours. CBG:  Recent Labs Lab 05/06/15 1926  GLUCAP 200*   Lipid Profile: No results for input(s): CHOL, HDL, LDLCALC, TRIG, CHOLHDL, LDLDIRECT in the last 72 hours. Thyroid Function Tests: No results for input(s): TSH, T4TOTAL, FREET4, T3FREE, THYROIDAB in the last 72 hours. Anemia Panel: No results for input(s): VITAMINB12, FOLATE, FERRITIN, TIBC, IRON, RETICCTPCT in the last 72 hours. Urine analysis:    Component Value Date/Time   COLORURINE YELLOW 05/06/2015 2045   APPEARANCEUR CLEAR 05/06/2015 2045   LABSPEC 1.021 05/06/2015 2045   PHURINE 5.5 05/06/2015 2045   GLUCOSEU >1000* 05/06/2015 2045   HGBUR NEGATIVE 05/06/2015 2045   BILIRUBINUR NEGATIVE 05/06/2015 2045   BILIRUBINUR n 01/12/2011 1323   KETONESUR NEGATIVE 05/06/2015 2045   PROTEINUR NEGATIVE 05/06/2015 2045   PROTEINUR n 01/12/2011 1323   UROBILINOGEN 1.0 01/12/2014 1649   UROBILINOGEN 4.0 01/12/2011 1323   NITRITE NEGATIVE 05/06/2015 2045   NITRITE n 01/12/2011 1323   LEUKOCYTESUR NEGATIVE 05/06/2015 2045   Sepsis Labs: @LABRCNTIP (procalcitonin:4,lacticidven:4) )No results found for this or any previous visit (from the past 240 hour(s)).   Radiological Exams  on Admission: Ct Angio Head W/cm &/or Wo  Cm  05/06/2015  CLINICAL DATA:  New onset of right upper and lower extremity numbness beginning at 5 p.m. tonight. Dizziness since 10 a.m. today. Disequilibrium. EXAM: CT ANGIOGRAPHY HEAD AND NECK TECHNIQUE: Multidetector CT imaging of the head and neck was performed using the standard protocol during bolus administration of intravenous contrast. Multiplanar CT image reconstructions and MIPs were obtained to evaluate the vascular anatomy. Carotid stenosis measurements (when applicable) are obtained utilizing NASCET criteria, using the distal internal carotid diameter as the denominator. CONTRAST:  100 mL Isovue 370 COMPARISON:  CTA of the neck 01/13/2014.  MRI brain 01/13/2014. FINDINGS: CT HEAD Brain: No acute cortical infarct or hemorrhage is present. The basal ganglia are intact. The insular ribbon is normal. The ventricles are proportionate to a minimal atrophy. No significant extra-axial fluid collection is present. Vascular calcifications are present within the cavernous internal carotid arteries bilaterally. Calvarium and skull base: Within normal limits. Paranasal sinuses: Clear Orbits: High-density is again noted in the right globe. The a left lens replacement is present. The orbits are otherwise unremarkable. CTA NECK Aortic arch: A 3 vessel arch configuration is present. There is no significant stenosis at the aortic arch. Right carotid system: The right common carotid artery is within normal limits. There are carotid stent is in place. The stent is narrowed in the AP diameter to approximately 3 mm. The more distal cervical internal carotid artery is intact Left carotid system: The left common carotid artery is within normal limits. Atherosclerotic calcifications are again noted at the carotid bifurcation. The lumen is narrowed to 3 mm, similar to the prior study. This compares to a more distal vessel caliber of 4.5 mm. Vertebral arteries:Both vertebral arteries originate from the subclavian arteries.  Vertebral arteries are codominant. There is no significant stenosis of either vertebral artery. Skeleton: Vertebral body heights alignment are maintained. The superior endplate Schmorl's node at C7 is stable. Vertebral body heights and alignment are maintained. Other neck: No focal mucosal or submucosal lesions are present. The tongue base is within normal limits. Vocal cords are midline and symmetric. The submandibular and parotid glands are within normal limits bilaterally. There is no significant adenopathy. The thyroid is unremarkable. CTA HEAD Anterior circulation: Dense atherosclerotic calcifications are present within the cavernous internal carotid arteries bilaterally without focal stenosis. The ICA terminus is normal bilaterally. The A1 and M1 segments are unremarkable. The anterior communicating artery is patent. The MCA bifurcations are intact. ACA and MCA branch vessels are unremarkable. Posterior circulation: The vertebral arteries are codominant. The right PICA origin is extradural. The left AICA is dominant. The vertebrobasilar junction is within normal limits. The basilar artery is normal. Both posterior cerebral arteries originate from the basilar tip. The PCA branch vessels are intact. Venous sinuses: The dural sinuses are patent. The right transverse sinus is dominant. The straight sinus and deep cerebral veins are intact. The cortical veins are patent. Anatomic variants: None Delayed phase: The postcontrast images demonstrate no pathologic enhancement. IMPRESSION: 1. Interval placement of stent across the right carotid bifurcation. The stent appears patent. The distal right ICA demonstrates a slightly larger diameter than on the prior exam, suggesting improved flow. 2. Stable atherosclerotic disease at the left carotid bifurcation without a significant stenosis relative to the more distal vessel. 3. Dense atherosclerotic calcifications within the cavernous internal carotid arteries bilaterally  without significant stenosis or change. 4. No significant atherosclerotic calcification or stenosis involving the posterior circulation. 5. ACA and MCA  branch vessels are within normal limits. Electronically Signed   By: Marin Robertshristopher  Mattern M.D.   On: 05/06/2015 21:47   Ct Angio Neck W/cm &/or Wo/cm  05/06/2015  CLINICAL DATA:  New onset of right upper and lower extremity numbness beginning at 5 p.m. tonight. Dizziness since 10 a.m. today. Disequilibrium. EXAM: CT ANGIOGRAPHY HEAD AND NECK TECHNIQUE: Multidetector CT imaging of the head and neck was performed using the standard protocol during bolus administration of intravenous contrast. Multiplanar CT image reconstructions and MIPs were obtained to evaluate the vascular anatomy. Carotid stenosis measurements (when applicable) are obtained utilizing NASCET criteria, using the distal internal carotid diameter as the denominator. CONTRAST:  100 mL Isovue 370 COMPARISON:  CTA of the neck 01/13/2014.  MRI brain 01/13/2014. FINDINGS: CT HEAD Brain: No acute cortical infarct or hemorrhage is present. The basal ganglia are intact. The insular ribbon is normal. The ventricles are proportionate to a minimal atrophy. No significant extra-axial fluid collection is present. Vascular calcifications are present within the cavernous internal carotid arteries bilaterally. Calvarium and skull base: Within normal limits. Paranasal sinuses: Clear Orbits: High-density is again noted in the right globe. The a left lens replacement is present. The orbits are otherwise unremarkable. CTA NECK Aortic arch: A 3 vessel arch configuration is present. There is no significant stenosis at the aortic arch. Right carotid system: The right common carotid artery is within normal limits. There are carotid stent is in place. The stent is narrowed in the AP diameter to approximately 3 mm. The more distal cervical internal carotid artery is intact Left carotid system: The left common carotid artery  is within normal limits. Atherosclerotic calcifications are again noted at the carotid bifurcation. The lumen is narrowed to 3 mm, similar to the prior study. This compares to a more distal vessel caliber of 4.5 mm. Vertebral arteries:Both vertebral arteries originate from the subclavian arteries. Vertebral arteries are codominant. There is no significant stenosis of either vertebral artery. Skeleton: Vertebral body heights alignment are maintained. The superior endplate Schmorl's node at C7 is stable. Vertebral body heights and alignment are maintained. Other neck: No focal mucosal or submucosal lesions are present. The tongue base is within normal limits. Vocal cords are midline and symmetric. The submandibular and parotid glands are within normal limits bilaterally. There is no significant adenopathy. The thyroid is unremarkable. CTA HEAD Anterior circulation: Dense atherosclerotic calcifications are present within the cavernous internal carotid arteries bilaterally without focal stenosis. The ICA terminus is normal bilaterally. The A1 and M1 segments are unremarkable. The anterior communicating artery is patent. The MCA bifurcations are intact. ACA and MCA branch vessels are unremarkable. Posterior circulation: The vertebral arteries are codominant. The right PICA origin is extradural. The left AICA is dominant. The vertebrobasilar junction is within normal limits. The basilar artery is normal. Both posterior cerebral arteries originate from the basilar tip. The PCA branch vessels are intact. Venous sinuses: The dural sinuses are patent. The right transverse sinus is dominant. The straight sinus and deep cerebral veins are intact. The cortical veins are patent. Anatomic variants: None Delayed phase: The postcontrast images demonstrate no pathologic enhancement. IMPRESSION: 1. Interval placement of stent across the right carotid bifurcation. The stent appears patent. The distal right ICA demonstrates a slightly  larger diameter than on the prior exam, suggesting improved flow. 2. Stable atherosclerotic disease at the left carotid bifurcation without a significant stenosis relative to the more distal vessel. 3. Dense atherosclerotic calcifications within the cavernous internal carotid arteries bilaterally without significant  stenosis or change. 4. No significant atherosclerotic calcification or stenosis involving the posterior circulation. 5. ACA and MCA branch vessels are within normal limits. Electronically Signed   By: Marin Roberts M.D.   On: 05/06/2015 21:47    EKG: Independently reviewed.  Assessment/Plan Principal Problem:   Numbness and tingling of right arm and leg Active Problems:   DM type 1 with diabetic peripheral neuropathy (HCC)   Essential hypertension   Numbness and tingling of R arm and leg - TIA/Stroke  MRI brain  Neuro notified of patients arrival to [facility]  2d echo  Stroke pathway  DM1 -  Continue insulin pump  HTN - continue home meds   DVT prophylaxis: Lovenox Code Status: Full Family Communication: No family in room Consults called: Neurology Admission status: Admit to obs   Hillary Bow DO [facility] Pager [phone] from 7PM-7AM  If 7AM-7PM, please contact the day physician for the patient [email or web address] Password [email or web address]  05/07/2015, 1:02 AM

## 2015-05-07 NOTE — Progress Notes (Addendum)
NURSING PROGRESS NOTE  Nathaniel PepperRichard L Carter [id number] Discharge Data: 05/07/2015 2:14 PM Attending Provider: Elease EtienneAnand D Hongalgi, MD WUJ:WJXBJYN,WGNFAOPCP:MCGOWEN,PHILIP H, MD     Nathaniel Pepperichard L Rohde to be D/C'd [facility].  Discussed with the patient the After Visit Summary and all questions fully answered. All IV's discontinued with no bleeding noted. All belongings returned to patient for patient to take home. Pt taken downstairs via wheelchair accompanied by a staff member.  Last Vital Signs:  Blood pressure 116/57, pulse 78, temperature 97.6 F (36.4 C), temperature source Oral, resp. rate 19, height 6\' 2"  (1.88 m), weight 96.163 kg (212 lb), SpO2 96 %.  Discharge Medication List   Medication List    TAKE these medications        aspirin 81 MG tablet  Take 81 mg by mouth daily.     clopidogrel 75 MG tablet  Commonly known as:  PLAVIX  Take 1 tablet (75 mg total) by mouth daily.     clotrimazole-betamethasone cream  Commonly known as:  LOTRISONE  Apply 1 application topically daily as needed (RASH).     insulin aspart 100 UNIT/ML injection  Commonly known as:  novoLOG  Use up to 60 units in the insulin pump per day     insulin pump Soln  Inject into the skin continuous. Basal rate .95/hr, current pump settings: 12am .45, 2am .65, 7am .95, 11am .95, 5pm .75, 11pm .5     levothyroxine 137 MCG tablet  Commonly known as:  SYNTHROID, LEVOTHROID  Take 1 tablet (137 mcg total) by mouth daily.     ranitidine 150 MG tablet  Commonly known as:  ZANTAC  Take 150 mg by mouth daily as needed for heartburn.     tadalafil 20 MG tablet  Commonly known as:  CIALIS  Take 1 tablet (20 mg total) by mouth daily as needed for erectile dysfunction.

## 2015-05-08 ENCOUNTER — Encounter (HOSPITAL_COMMUNITY): Payer: Self-pay | Admitting: Family Medicine

## 2015-05-08 LAB — HEMOGLOBIN A1C
HEMOGLOBIN A1C: 9.2 % — AB (ref 4.8–5.6)
MEAN PLASMA GLUCOSE: 217 mg/dL

## 2015-05-12 ENCOUNTER — Ambulatory Visit: Payer: Self-pay | Admitting: Cardiology

## 2015-05-13 ENCOUNTER — Encounter: Payer: Self-pay | Admitting: Cardiology

## 2015-05-14 ENCOUNTER — Ambulatory Visit: Payer: PRIVATE HEALTH INSURANCE | Admitting: Family Medicine

## 2015-05-15 DIAGNOSIS — E103592 Type 1 diabetes mellitus with proliferative diabetic retinopathy without macular edema, left eye: Secondary | ICD-10-CM | POA: Insufficient documentation

## 2015-05-15 LAB — HM DIABETES EYE EXAM

## 2015-06-10 ENCOUNTER — Ambulatory Visit (INDEPENDENT_AMBULATORY_CARE_PROVIDER_SITE_OTHER): Payer: PRIVATE HEALTH INSURANCE | Admitting: Internal Medicine

## 2015-06-10 ENCOUNTER — Encounter: Payer: Self-pay | Admitting: Internal Medicine

## 2015-06-10 DIAGNOSIS — E1165 Type 2 diabetes mellitus with hyperglycemia: Secondary | ICD-10-CM

## 2015-06-10 DIAGNOSIS — E1159 Type 2 diabetes mellitus with other circulatory complications: Secondary | ICD-10-CM | POA: Diagnosis not present

## 2015-06-10 DIAGNOSIS — I639 Cerebral infarction, unspecified: Secondary | ICD-10-CM

## 2015-06-10 NOTE — Progress Notes (Signed)
Patient ID: [name], male   DOB: 10/12/1950, 65 y.o.   MRN: [id number]  HPI: [name] is a 65 y.o.-year-old male, returning for f/u for DM1, dx 641964 67(65 y/o), uncontrolled, with complications (CAD, proliferative diabetic retinopathy-history of retinal detachment OD, right carotid stenosis, cerebrovascular disease - s/p TIA 01/2014, PVD, peripheral neuropathy, ED). Last visit 3 mo ago.  Since last visit, he was admitted for another TIA 05/07/2015.  He still has episodes of dizziness. Has appt with Dr Allyson SabalBerry and Dr. Pearlean BrownieSethi coming up.  Last hemoglobin A1c was: Lab Results  Component Value Date   HGBA1C 9.2* 05/07/2015   HGBA1C 8.9 03/09/2015   HGBA1C 8.4 08/13/2014   Pt is on an insulin pump: One Touch Ping (Animas) with NovoLog. He got a replacement pump in 10/2014.  Pump settings - HE DID NOT MAKE ANY OF THE PUMP SETTING CHANGES WI SUGGESTED AT LAST VISIT, Which are shown below ("I must have forgotten"): - basal rate:  MN: 0.55 >> 0.7 2 am: 0.75 >> 1.1 7 am: 1.1 11 am: 1.1 5 PM: 0.85 >> 1.0 11pm 0.65 >> 0.85 - ICR:  12 am: 15 6 am: 12 >> 10 10 am 12 >> 10 4 pm: 10 - ISF:   MN: 30   11 am: 25  9 pm: 40 - target:  MN: 140 >> 120 5:30 am: 110  10 pm: 140 >> 120   - IOB: 4 h  TDD bolus ave 56% (25.6 units) TDD basal ave 44% (20.4 units)  Sugars: 72% above target, 1% below target, 27% within target  Pt checks his sugars 4.1x a day and they are still high - ave 207+/-86 >> 229+/-71 >> 236 >> 266+/-113. We downloaded his pump and his meter - sugars are even higher than last time:  Prev:   - am: 112-266, 300, 343 >> 66-204 >> 56, 62-195, 258, 360 >> 76, 98-253, 397 >> 133-295 - 2h after b'fast:190, 441 >> n/c >> 110-315 >> 130-140 >> 202-367 >> 134, 266 >> 170-309 - before lunch: 141-208 >> 52-183, 355 when sick >> 61, 96-294, 330 >> 147-296, 481 >> 116-309 - 2h after lunch: 99-212, 409 >> 128, 337 when sick >> 324, 325 >> 232-423 >> 100-345 - before dinner:  91-321, higher values when sick >> 77-330 >> 111-262 >> 57x1, 125-403, 4454 - bedtime: 125-391 >> 161-388 >> 139-333 >> 87, 116-282 >> 108-289 >> 123-356 >> 196-471 - nighttime: 275, 172 >> 110, 193, 393 >> 110-190, 295 >> 261, 263 >> 115, 122 + few lows. Lowest sugar was 40s (after physical exertion) >> 56 >> 76 >> 57(after working outside); he has hypoglycemia awareness at 100. Highest: 439 >> 481 >> 400s (site pb).   Glucometer: [employer].  Pt's meals are: - Breakfast: 1.5 scramble eggs, toast, milk - 65 g carbs - Lunch: sandwich, shaved chicken or Malaw[place]urkey breast, cheese, mayo, chips, soda - 100 carbs - Dinner: meat, vegetables, bread, fruit, milk - 100 g carbs - Snacks: 2-3 a day: bagel midmorning, evening snack: chips, popcorn  Pt does have mild chronic kidney disease, last BUN/creatinine was:  Lab Results  Component Value Date   BUN 18 05/06/2015   CREATININE 1.00 05/06/2015  Last microalbumin/creatinine ratio was 3.8.  Last set of lipids: Lab Results  Component Value Date   CHOL 268* 05/07/2015   HDL 67 05/07/2015   LDLCALC 186* 05/07/2015   LDLDIRECT 159.5 04/21/2011   TRIG 74 05/07/2015   CHOLHDL 4.0 05/07/2015  He is not on a statin, this is listed as an intolerance. Pt's last eye exam was in 05/2015 [facility]). He has proliferative DR OS, w/o macular edema, s/p detached retina.  Has numbness and tingling in his legs.  He also has a history of hyperlipidemia, disequilibrium, mild cognitive impairment.  Also has hypothyroidism-status post thyroidectomy for multinodular goiter. Last TSH: Lab Results  Component Value Date   TSH 4.22 10/09/2014   He also has OSA-on CPAP, GERD.  I reviewed pt's medications, allergies, PMH, social hx, family hx, and changes were documented in the history of present illness. Otherwise, unchanged from my initial visit note.  ROS: Constitutional: no weight gain, no fatigue, no subjective hyperthermia/hypothermia Eyes: no  blurry vision, no xerophthalmia ENT: no sore throat, no nodules palpated in throat, no dysphagia/odynophagia, no hoarseness Cardiovascular: no CP/SOB/palpitations/ leg swelling Respiratory: no cough/SOB Gastrointestinal: no N/V/D/C Musculoskeletal: no muscle aches/joint aches Skin: no rashes Neurological: no tremors/numbness/tingling/dizziness  PE: BP 118/74 mmHg  Pulse 74  Wt 219 lb (99.338 kg)  SpO2 96% Body mass index is 28.11 kg/(m^2). Wt Readings from Last 3 Encounters:  06/10/15 219 lb (99.338 kg)  05/06/15 212 lb (96.163 kg)  03/09/15 219 lb (99.338 kg)  There is no weight on file to calculate BMI.  Constitutional: slightly overweight, in NAD Eyes: PERRLA, EOMI, no exophthalmos ENT: moist mucous membranes, no thyromegaly, no cervical lymphadenopathy Cardiovascular: RRR, + 2/6 SEM, no RG Respiratory: CTA B Gastrointestinal: abdomen soft, NT, ND, BS+ Musculoskeletal: no deformities, strength intact in all 4 Skin: moist, warm, no rashes  ASSESSMENT: 1. DM1, uncontrolled, with complications - CAD, last 2-D echo 05/31/2012: EF 60-65%, mild LVF, moderate aortic stenosis, no LVWMA - cerebrovascular disease - s/p TIA 01/2014 - no neurologic deficit - proliferative diabetic retinopathy-history of retinal detachment OD - right carotid stenosis - had carotid duplex study 05/31/2012 - PVD - peripheral neuropathy - ED  He felt that Humalog was not as efficient with NovoLog and he had to switch back to NovoLog using a preauthorization.  PLAN:  1.  Patient with long-standing type 1 diabetes, poorly controlled, with multiple complications, on an insulin pump. At the review of his pump settings today, I realized that he did not make any of the changes that I suggested at last visit. He mentions that he forgot to enter them... I had a long discussion with the patient about the need to start taking control of his diabetes and the fact that if he is not following my advice (this is not  the first time that he did not enter the suggested changes into the pump as he forgot!), then I cannot help him. Since his sugars are increasing throughout the day, I gave him the same changes that I suggested at last visit, and he entered on while in the clinic. From now on, we will need to do this and I will also need to have him come back more frequently than 3 months apart. - He also needs to start eating better, checking sugars and and bolusing with every meal - Reviewed his last HbA1c which was higher, 9.2%, a month ago. -  I advised him to: Patient Instructions  Please use the following pump settings: - basal rate:  MN: 0.55 >> 0.7 2 am: 0.75 >> 1.1 7 am: 1.1 11 am: 1.1 5 PM: 0.85 >> 1.0 11pm 0.65 >> 0.85 - ICR:  12 am: 15 6 am: 12 >> 10 10 am 12 >> 10 4  pm: 10 - ISF:   MN: 30   11 am: 25  9 pm: 40 - target:  MN: 140 >> 120 5:30 am: 110  10 pm: 140 >> 120  Please bolus for lunch every day.  Please come back for a follow-up appointment in 1.5 months.  - time spent with the patient: 40 min, of which >50% was spent in reviewing his pump downloads, discussing his hypo- and hyper-glycemic episodes, reviewing his previous labs and pump settings and developing a plan to avoid hypo- an hyper-glycemia.

## 2015-06-10 NOTE — Patient Instructions (Addendum)
Please use the following pump settings: - basal rate:  MN: 0.55 >> 0.7 2 am: 0.75 >> 1.1 7 am: 1.1 11 am: 1.1 5 PM: 0.85 >> 1.0 11pm 0.65 >> 0.85 - ICR:  12 am: 15 6 am: 12 >> 10 10 am 12 >> 10 4 pm: 10 - ISF:   MN: 30   11 am: 25  9 pm: 40 - target:  MN: 140 >> 120 5:30 am: 110  10 pm: 140 >> 120  Please bolus for lunch every day.  Please come back for a follow-up appointment in 1.5 months.

## 2015-06-12 ENCOUNTER — Encounter: Payer: Self-pay | Admitting: Cardiovascular Disease

## 2015-06-12 ENCOUNTER — Ambulatory Visit (INDEPENDENT_AMBULATORY_CARE_PROVIDER_SITE_OTHER): Payer: PRIVATE HEALTH INSURANCE | Admitting: Cardiovascular Disease

## 2015-06-12 ENCOUNTER — Other Ambulatory Visit: Payer: Self-pay | Admitting: *Deleted

## 2015-06-12 VITALS — BP 132/84 | HR 78 | Ht 74.0 in | Wt 221.2 lb

## 2015-06-12 DIAGNOSIS — Z01818 Encounter for other preprocedural examination: Secondary | ICD-10-CM

## 2015-06-12 DIAGNOSIS — I350 Nonrheumatic aortic (valve) stenosis: Secondary | ICD-10-CM

## 2015-06-12 DIAGNOSIS — R0609 Other forms of dyspnea: Secondary | ICD-10-CM

## 2015-06-12 DIAGNOSIS — R0600 Dyspnea, unspecified: Secondary | ICD-10-CM

## 2015-06-12 DIAGNOSIS — I2510 Atherosclerotic heart disease of native coronary artery without angina pectoris: Secondary | ICD-10-CM

## 2015-06-12 DIAGNOSIS — R0989 Other specified symptoms and signs involving the circulatory and respiratory systems: Secondary | ICD-10-CM

## 2015-06-12 DIAGNOSIS — I2583 Coronary atherosclerosis due to lipid rich plaque: Secondary | ICD-10-CM

## 2015-06-12 DIAGNOSIS — I6521 Occlusion and stenosis of right carotid artery: Secondary | ICD-10-CM

## 2015-06-12 DIAGNOSIS — E785 Hyperlipidemia, unspecified: Secondary | ICD-10-CM

## 2015-06-12 NOTE — Patient Instructions (Signed)
Medication Instructions:  Your physician recommends that you continue on your current medications as directed. Please refer to the Current Medication list given to you today.    Testing/Procedures: Your physician has requested that you have a RIGHT AND LEFT cardiac catheterization. Cardiac catheterization is used to diagnose and/or treat various heart conditions. Doctors may recommend this procedure for a number of different reasons. The most common reason is to evaluate chest pain. Chest pain can be a symptom of coronary artery disease (CAD), and cardiac catheterization can show whether plaque is narrowing or blocking your heart's arteries. This procedure is also used to evaluate the valves, as well as measure the blood flow and oxygen levels in different parts of your heart. For further information please visit [email or web address].   Following your catheterization, you will not be allowed to drive for 3 days.  No lifting, pushing, or pulling greater that 10 pounds is allowed for 1 week.  You will be required to have the following tests prior to the procedure:  1. Blood work-the blood work can be done no more than 14 days prior to the procedure.  It can be done at any [facility] lab.  There is one downstairs on the first floor of this building and one in the [facility] building [phone]([address], [address]).  2. Chest Xray-the chest xray order has already been placed at the [facility].      Puncture site RIGHT GROIN   Your physician has requested that you have a carotid duplex. This test is an ultrasound of the carotid arteries in your neck. It looks at blood flow through these arteries that supply the brain with blood. Allow one hour for this exam. There are no restrictions or special instructions.    Follow-Up: You have been referred to LIPID CLINIC WITH PHARMACIST.   Any Other Special Instructions Will Be Listed Below (If Applicable).     If  you need a refill on your cardiac medications before your next appointment, please call your pharmacy.

## 2015-06-12 NOTE — Progress Notes (Signed)
06/12/2015 [name]   07/18/1950  [id number]  Primary Physician Nathaniel MassedMCGOWEN,PHILIP H, MD Primary Cardiologist: Nathaniel GessJonathan J Sonu Kruckenberg MD [name]  HPI:  [name] is a 65 year old mildly overweight married Caucasian male, father of 3 and grandfather to 3 grandchildren, I last saw at the time of his carotid stent 12/22/14. He has a history of insulin-dependent diabetes on insulin pump, mild sleep apnea, and hyperlipidemia as well as mild aortic stenosis. He had a Myoview stress test that was abnormal which led to a heart catheterization performed by Dr. Tresa Carter in my absence revealing 70% disease in all 3 vessels with preserved LV function and mild AS. He also had moderate right ICA stenosis. He was neurologically asymptomatic. He was placed on beta-blocker and Ranexa Carotid Dopplers showed stable moderate right ICA stenosis. [facility] with 2 sequential TIAs. Carotid Dopplers did show moderate right ICA stenosis. The patient was seen by [name], stroke neurologist, asked Dr. Imogene Carter, vascular surgeon for consultation. Dr. Imogene Carter ordered a CTA which showed high-grade ostial right internal carotid artery stenosis. I am asked to see the patient for consideration of carotid artery stenting. The patient was offered carotid endarterectomy. [name] apparently had a bad experience remotely being intubated at the time of the parathyroid operation and does not want to be reintubated again if at all possible making carotid stenting the best option for revascularization. I perform this on 01/16/14 successfully without complication. His follow-up Doppler 2 weeks later showed his carotid artery widely patent. Since I saw him 6 months ago he's been admitted with TIA type symptoms with negative workup. He also complains of chronic dizziness and increasing dyspnea on exertion. This recent 2-D echo showed increase in hisaortic stenosis now to moderately severewith a peak gradient  that has increased from 35 up to 52 mmHg.  Current Outpatient Prescriptions  Medication Sig Dispense Refill  . aspirin 81 MG tablet Take 81 mg by mouth daily.    . clopidogrel (PLAVIX) 75 MG tablet Take 1 tablet (75 mg total) by mouth daily. 90 tablet 3  . clotrimazole-betamethasone (LOTRISONE) cream Apply 1 application topically daily as needed (RASH). 45 g 2  . insulin aspart (NOVOLOG) 100 UNIT/ML injection Use up to 60 units in the insulin pump per day 6 vial 3  . Insulin Human (INSULIN PUMP) SOLN Inject into the skin continuous. Basal rate .95/hr, current pump settings: 12am .45, 2am .65, 7am .95, 11am .95, 5pm .75, 11pm .5    . levothyroxine (SYNTHROID, LEVOTHROID) 137 MCG tablet Take 1 tablet (137 mcg total) by mouth daily. 90 tablet 1  . ranitidine (ZANTAC) 150 MG tablet Take 150 mg by mouth daily as needed for heartburn.    . tadalafil (CIALIS) 20 MG tablet Take 1 tablet (20 mg total) by mouth daily as needed for erectile dysfunction. 10 tablet 1   No current facility-administered medications for this visit.    Allergies  Allergen Reactions  . Rosuvastatin Calcium Other (See Comments)    Problem with higher dosages (Lipitor caused memory issues), also caused leg pains and lethargy  . Statins Other (See Comments)    Problem with higher dosages (Lipitor caused memory issues), also caused leg pains and lethargy  . Tape Rash    Adhesive tape.  (Paper tape OK)    Social History   Social History  . Marital Status: Married    Spouse Name: N/A  . Number of Children: N/A  .  Years of Education: N/A   Occupational History  . Not on file.   Social History Main Topics  . Smoking status: Former Games developer  . Smokeless tobacco: Former Neurosurgeon    Quit date: 06/22/1987  . Alcohol Use: 0.0 oz/week    0 Standard drinks or equivalent per week  . Drug Use: No  . Sexual Activity: Not on file   Other Topics Concern  . Not on file   Social History Narrative   Divorced x1, remarried.  Has 3  daughters from first marriage.   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   Distant tobacco abuse (35 pack-yr hx), quit in the late 1990s, no ETOH or drugs.   No exercise.     Review of Systems: General: negative for chills, fever, night sweats or weight changes.  Cardiovascular: negative for chest pain, dyspnea on exertion, edema, orthopnea, palpitations, paroxysmal nocturnal dyspnea or shortness of breath Dermatological: negative for rash Respiratory: negative for cough or wheezing Urologic: negative for hematuria Abdominal: negative for nausea, vomiting, diarrhea, bright red blood per rectum, melena, or hematemesis Neurologic: negative for visual changes, syncope, or dizziness All other systems reviewed and are otherwise negative except as noted above.    Blood pressure 132/84, pulse 78, height  (1.88 m), weight 221 lb 3.2 oz (100.336 kg).  General appearance: alert and no distress Neck: no adenopathy, no JVD, supple, symmetrical, trachea midline, thyroid not enlarged, symmetric, no tenderness/mass/nodules and bilateral carotid bruits Lungs: clear to auscultation bilaterally Heart: 2/6 systolic ejection murmur at the base consistent with aortic stenosis Extremities: extremities normal, atraumatic, no cyanosis or edema  EKG not performed today  ASSESSMENT AND PLAN:   Aortic stenosis, moderate History of moderate aortic stenosis with a recent 2-D echo performedv1/10/17 revealing a valve area of 0.9 cm2 with an increase in peak gradient from 35-50 mmHg.[name] He does admit to frequent episodes of dizziness with dyspnea on exertion. I suspect his aortic stenosis has become physiologically symptomatic. We talked about performing outpatient right and left heart cath which I'll schedule sometime in the next several weeks.  Essential hypertension History of hypertension blood  pressure measured at 132/84.He is not on antihypertensive medications  Carotid stenosis, right History of carotid artery disease status post right internal carotid artery stenting by myself 01/16/14. He has had some symptoms which sound like TIAs however they were on the contralateral side. He is on aspirin and Plavix. He has bilateral carotid bruits. I'm going to repeat carotid Dopplers.  CAD (coronary artery disease) History of coronary artery disease status post cardiac catheterization performed by Dr. Tresa Endo revealing 70% disease in all 3 coronary arteries with preserved LV function. He really denies chest pain but does get dyspnea on exertion. In addition, he does have progressive now moderately severe aortic stenosis. Because of this I'm arranging for him to undergo cardiac catheterization to further define his anatomy and physiology.  Hyperlipidemia History of hyperlipidemia not on statin therapy because of statin intolerance with recent lipid profile performed 05/07/15 revealed a total social 268, LDL 186 and HDL of 67. I'm going to have him see Baxter Hire to discuss PCSK9 monoclonal injectables      Nathaniel Gess MD [facility], [facility] 06/12/2015 10:09 AM

## 2015-06-12 NOTE — Assessment & Plan Note (Signed)
History of hyperlipidemia not on statin therapy because of statin intolerance with recent lipid profile performed 05/07/15 revealed a total social 268, LDL 186 and HDL of 67. I'm going to have him see Baxter HireKristen to discuss PCSK9 monoclonal injectables

## 2015-06-12 NOTE — Assessment & Plan Note (Addendum)
History of coronary artery disease status post cardiac catheterization performed by Dr. Tresa EndoKelly revealing 70% disease in all 3 coronary arteries with preserved LV function. He really denies chest pain but does get dyspnea on exertion. In addition, he does have progressive now moderately severe aortic stenosis. Because of this I'm arranging for him to undergo cardiac catheterization to further define his anatomy and physiology.

## 2015-06-12 NOTE — Assessment & Plan Note (Signed)
History of carotid artery disease status post right internal carotid artery stenting by myself 01/16/14. He has had some symptoms which sound like TIAs however they were on the contralateral side. He is on aspirin and Plavix. He has bilateral carotid bruits. I'm going to repeat carotid Dopplers.

## 2015-06-12 NOTE — Assessment & Plan Note (Signed)
History of moderate aortic stenosis with a recent 2-D echo performedv1/10/17 revealing a valve area of 0.9 cm2 with an increase in peak gradient from 35-50 mmHg.[name]. He does admit to frequent episodes of dizziness with dyspnea on exertion. I suspect his aortic stenosis has become physiologically symptomatic. We talked about performing outpatient right and left heart cath which I'll schedule sometime in the next several weeks.

## 2015-06-12 NOTE — Assessment & Plan Note (Signed)
History of hypertension blood pressure measured at 132/84.He is not on antihypertensive medications

## 2015-06-14 ENCOUNTER — Encounter: Payer: Self-pay | Admitting: Family Medicine

## 2015-06-15 ENCOUNTER — Encounter: Payer: Self-pay | Admitting: Internal Medicine

## 2015-06-15 ENCOUNTER — Other Ambulatory Visit: Payer: Self-pay | Admitting: Cardiovascular Disease

## 2015-06-15 DIAGNOSIS — I6523 Occlusion and stenosis of bilateral carotid arteries: Secondary | ICD-10-CM

## 2015-06-15 DIAGNOSIS — R0989 Other specified symptoms and signs involving the circulatory and respiratory systems: Secondary | ICD-10-CM

## 2015-06-16 ENCOUNTER — Ambulatory Visit (INDEPENDENT_AMBULATORY_CARE_PROVIDER_SITE_OTHER): Payer: PRIVATE HEALTH INSURANCE | Admitting: Pharmacist Clinician (PhC)/ Clinical Pharmacy Specialist

## 2015-06-16 ENCOUNTER — Encounter: Payer: Self-pay | Admitting: Internal Medicine

## 2015-06-16 VITALS — Ht 74.0 in | Wt 220.8 lb

## 2015-06-16 DIAGNOSIS — E785 Hyperlipidemia, unspecified: Secondary | ICD-10-CM | POA: Diagnosis not present

## 2015-06-16 DIAGNOSIS — I639 Cerebral infarction, unspecified: Secondary | ICD-10-CM

## 2015-06-16 NOTE — Telephone Encounter (Signed)
Patient insurance stated that he is no longer approved for the Novalog, they gave him other alternatives, but Novalog works for him, send approval letter to [employer] phone # [phone]

## 2015-06-16 NOTE — Patient Instructions (Signed)
Heart-Healthy Eating Plan °Many factors influence your heart health, including eating and exercise habits. Heart (coronary) risk increases with abnormal blood fat (lipid) levels. Heart-healthy meal planning includes limiting unhealthy fats, increasing healthy fats, and making other small dietary changes. This includes maintaining a healthy body weight to help keep lipid levels within a normal range. °WHAT IS MY PLAN?  °Your health care provider recommends that you: °· Get no more than _________% of the total calories in your daily diet from fat. °· Limit your intake of saturated fat to less than _________% of your total calories each day. °· Limit the amount of cholesterol in your diet to less than _________ mg per day. °WHAT TYPES OF FAT SHOULD I CHOOSE? °· Choose healthy fats more often. Choose monounsaturated and polyunsaturated fats, such as olive oil and canola oil, flaxseeds, walnuts, almonds, and seeds. °· Eat more omega-3 fats. Good choices include salmon, mackerel, sardines, tuna, flaxseed oil, and ground flaxseeds. Aim to eat fish at least two times each week. °· Limit saturated fats. Saturated fats are primarily found in animal products, such as meats, butter, and cream. Plant sources of saturated fats include palm oil, palm kernel oil, and coconut oil. °· Avoid foods with partially hydrogenated oils in them. These contain trans fats. Examples of foods that contain trans fats are stick margarine, some tub margarines, cookies, crackers, and other baked goods. °WHAT GENERAL GUIDELINES DO I NEED TO FOLLOW? °· Check food labels carefully to identify foods with trans fats or high amounts of saturated fat. °· Fill one half of your plate with vegetables and green salads. Eat 4-5 servings of vegetables per day. A serving of vegetables equals 1 cup of raw leafy vegetables, ½ cup of raw or cooked cut-up vegetables, or ½ cup of vegetable juice. °· Fill one fourth of your plate with whole grains. Look for the word  "whole" as the first word in the ingredient list. °· Fill one fourth of your plate with lean protein foods. °· Eat 4-5 servings of fruit per day. A serving of fruit equals one medium whole fruit, ¼ cup of dried fruit, ½ cup of fresh, frozen, or canned fruit, or ½ cup of 100% fruit juice. °· Eat more foods that contain soluble fiber. Examples of foods that contain this type of fiber are apples, broccoli, carrots, beans, peas, and barley. Aim to get 20-30 g of fiber per day. °· Eat more home-cooked food and less restaurant, buffet, and fast food. °· Limit or avoid alcohol. °· Limit foods that are high in starch and sugar. °· Avoid fried foods. °· Cook foods by using methods other than frying. Baking, boiling, grilling, and broiling are all great options. Other fat-reducing suggestions include: °¨ Removing the skin from poultry. °¨ Removing all visible fats from meats. °¨ Skimming the fat off of stews, soups, and gravies before serving them. °¨ Steaming vegetables in water or broth. °· Lose weight if you are overweight. Losing just 5-10% of your initial body weight can help your overall health and prevent diseases such as diabetes and heart disease. °· Increase your consumption of nuts, legumes, and seeds to 4-5 servings per week. One serving of dried beans or legumes equals ½ cup after being cooked, one serving of nuts equals 1½ ounces, and one serving of seeds equals ½ ounce or 1 tablespoon. °· You may need to monitor your salt (sodium) intake, especially if you have high blood pressure. Talk with your health care provider or dietitian to get   more information about reducing sodium. °WHAT FOODS CAN I EAT? °Grains °Breads, including French, white, pita, wheat, raisin, rye, oatmeal, and Italian. Tortillas that are neither fried nor made with lard or trans fat. Low-fat rolls, including hotdog and hamburger buns and English muffins. Biscuits. Muffins. Waffles. Pancakes. Light popcorn. Whole-grain cereals. Flatbread. Melba  toast. Pretzels. Breadsticks. Rusks. Low-fat snacks and crackers, including oyster, saltine, matzo, graham, animal, and rye. Rice and pasta, including brown rice and those that are made with whole wheat. °Vegetables °All vegetables. °Fruits °All fruits, but limit coconut. °Meats and Other Protein Sources °Lean, well-trimmed beef, veal, pork, and lamb. Chicken and turkey without skin. All fish and shellfish. Wild duck, rabbit, pheasant, and venison. Egg whites or low-cholesterol egg substitutes. Dried beans, peas, lentils, and tofu. Seeds and most nuts. °Dairy °Low-fat or nonfat cheeses, including ricotta, string, and mozzarella. Skim or 1% milk that is liquid, powdered, or evaporated. Buttermilk that is made with low-fat milk. Nonfat or low-fat yogurt. °Beverages °Mineral water. Diet carbonated beverages. °Sweets and Desserts °Sherbets and fruit ices. Honey, jam, marmalade, jelly, and syrups. Meringues and gelatins. Pure sugar candy, such as hard candy, jelly beans, gumdrops, mints, marshmallows, and small amounts of dark chocolate. Angel food cake. °Eat all sweets and desserts in moderation. °Fats and Oils °Nonhydrogenated (trans-free) margarines. Vegetable oils, including soybean, sesame, sunflower, olive, peanut, safflower, corn, canola, and cottonseed. Salad dressings or mayonnaise that are made with a vegetable oil. Limit added fats and oils that you use for cooking, baking, salads, and as spreads. °Other °Cocoa powder. Coffee and tea. All seasonings and condiments. °The items listed above may not be a complete list of recommended foods or beverages. Contact your dietitian for more options. °WHAT FOODS ARE NOT RECOMMENDED? °Grains °Breads that are made with saturated or trans fats, oils, or whole milk. Croissants. Butter rolls. Cheese breads. Sweet rolls. Donuts. Buttered popcorn. Chow mein noodles. High-fat crackers, such as cheese or butter crackers. °Meats and Other Protein Sources °Fatty meats, such as  hotdogs, short ribs, sausage, spareribs, bacon, ribeye roast or steak, and mutton. High-fat deli meats, such as salami and bologna. Caviar. Domestic duck and goose. Organ meats, such as kidney, liver, sweetbreads, brains, gizzard, chitterlings, and heart. °Dairy °Cream, sour cream, cream cheese, and creamed cottage cheese. Whole milk cheeses, including blue (bleu), Monterey Jack, Brie, Colby, American, Havarti, Swiss, cheddar, Camembert, and [place].  Whole or 2% milk that is liquid, evaporated, or condensed. Whole buttermilk. Cream sauce or high-fat cheese sauce. Yogurt that is made from whole milk. °Beverages °Regular sodas and drinks with added sugar. °Sweets and Desserts °Frosting. Pudding. Cookies. Cakes other than angel food cake. Candy that has milk chocolate or white chocolate, hydrogenated fat, butter, coconut, or unknown ingredients. Buttered syrups. Full-fat ice cream or ice cream drinks. °Fats and Oils °Gravy that has suet, meat fat, or shortening. Cocoa butter, hydrogenated oils, palm oil, coconut oil, palm kernel oil. These can often be found in baked products, candy, fried foods, nondairy creamers, and whipped toppings. Solid fats and shortenings, including bacon fat, salt pork, lard, and butter. Nondairy cream substitutes, such as coffee creamers and sour cream substitutes. Salad dressings that are made of unknown oils, cheese, or sour cream. °The items listed above may not be a complete list of foods and beverages to avoid. Contact your dietitian for more information. °  °This information is not intended to replace advice given to you by your health care provider. Make sure you discuss any questions you have with your health   care provider. °  °Document Released: 09/29/2007 Document Revised: 01/10/2014 Document Reviewed: 06/13/2013 °Elsevier Interactive Patient Education ©2016 [employer] ° °

## 2015-06-17 ENCOUNTER — Telehealth: Payer: Self-pay | Admitting: Family Medicine

## 2015-06-17 ENCOUNTER — Encounter: Payer: Self-pay | Admitting: Pharmacist Clinician (PhC)/ Clinical Pharmacy Specialist

## 2015-06-17 ENCOUNTER — Telehealth: Payer: Self-pay | Admitting: *Deleted

## 2015-06-17 ENCOUNTER — Other Ambulatory Visit: Payer: Self-pay | Admitting: *Deleted

## 2015-06-17 DIAGNOSIS — I350 Nonrheumatic aortic (valve) stenosis: Secondary | ICD-10-CM

## 2015-06-17 NOTE — Assessment & Plan Note (Signed)
Had a long discussion about the possibility of PCSK-9 inhibitor.  He was hesitant at first, but is willing to try.  He had more concerns about his LDL going too low (below 50) and wondered about taking 1/2 dose.  I explained that with a starting LDL of 186 we probably would not get that low, but could address that issue should it arise.   He is willing to start with the 140 mg injections.  He was given a $5 copay card.

## 2015-06-17 NOTE — Telephone Encounter (Signed)
Requested a call back from Onnie BoerJennifer Clark to discuss.

## 2015-06-17 NOTE — Telephone Encounter (Signed)
Pt called and told to put his pump in a temp. Basal rate of 70% or 30% reduction.  Pt. Reported that he knows how to do this and had no final questions.

## 2015-06-17 NOTE — Telephone Encounter (Signed)
Returned call to Genworth FinancialMegan. She said Dr Lucianne MussKumar is handling the matter for Dr Elvera LennoxGherghe who is out of town until Monday. Dr Lucianne MussKumar advised for the patient to do a temporary decrease in basal insulin by 30% 1 hour prior to the procedure. Aundra MilletMegan said she would have the Diabetic coordinator contact the patient to direct the patient on what that rate change would be.

## 2015-06-17 NOTE — Telephone Encounter (Signed)
Called pt on work number. Let him know that someone would be calling him from Dr Charlean SanfilippoGherghe's office concerning his insulin pump. Reather LittlerAjay Kumar, MD at 06/17/2015 1:54 PM     Status: Signed       Expand All Collapse All   He will not stop the pump. He can do a temporary basal reduction of 30% starting at least an hour before the procedure until he is eating       He verbalized understanding. I also reminded him he needed to get his lab work and chest xray, tomorrow if possible.  He said he was planning to get those on Thursday.

## 2015-06-17 NOTE — Telephone Encounter (Signed)
I contacted Nathaniel DikeJennifer with [facility] and advised instructions below. [name] (Diabetic Educator) she will contact the pt and discuss basal reduction.

## 2015-06-17 NOTE — Telephone Encounter (Signed)
Also, contacted Dr Lavella HammockGherge's office and message left for her nurse to call me back to discuss the insulin pump.

## 2015-06-17 NOTE — Telephone Encounter (Signed)
Follow-up      Please call the office Aundra MilletMegan was calling for [facility]

## 2015-06-17 NOTE — Telephone Encounter (Signed)
Onnie BoerJennifer Clark w/Dr. Nanetta BattyJonathan Berry called inquiring about patient's insulin pump.  She wanted to know if the patient should stop the pump or what he needed to do because he is having a heart catherization on 06/22/15.

## 2015-06-17 NOTE — Telephone Encounter (Signed)
He will not stop the pump.  He can do a temporary basal reduction of 30% starting at least an hour before the procedure until he is eating

## 2015-06-17 NOTE — Telephone Encounter (Signed)
Placed call to pt to let him know I am in the process of contacting Dr Lavella HammockGherge's office for direction on what to do for the patient's insulin pump regarding his upcoming procedure on 06/22/15.

## 2015-06-17 NOTE — Telephone Encounter (Signed)
Could you please advise during Dr. Charlean Sanfilippogherghe's absence? Thanks!

## 2015-06-17 NOTE — Progress Notes (Signed)
06/17/2015 [name] 01/17/1950 [id number]   HPI:  Nathaniel PepperRichard Carter Carter is a 65 y.o. male patient of Dr [name], who presents today for a lipid clinic evaluation.  He continue to work as a Psychologist, counsellingresearch scientist.  He has had problems with statin drugs in the past, memory problems that led him to be worried about keeping his current job.  Once he stopped the memory problems resolved.  He also noted myalgias at the higher doses of several statins.    Current Medications: none  Risk Factors: CAD with 70% disease in all 3 coronary arteries, carotid stent (1-16), hypertension, aortic stenosis  Cholesterol Goals: LDL <70   Intolerant/previously tried: atorvastatin 20-80 mg doses, higher doses caused myalgias, all doses led to memory impairment; same with rosuvastatin 5-20 mg.  Also tried pravastatin 10 mg with memory impairment  Family history: nothing significant  Diet: eats "everything", mostly eats out, cafeteria or sit down restaurants, some fast food;   Exercise:  No regular exercise, but stays active.  Actually hand dug a small pond in his back yard, chops trees and cuts firewood  Labs:  Results for [name] (MRN [id number]) as of 06/17/2015 10:24  Ref. Range 05/07/2015 05:57  Cholesterol Latest Ref Range: 0-200 mg/dL 413268 (H)  Triglycerides Latest Ref Range: <150 mg/dL 74  HDL Cholesterol Latest Ref Range: >40 mg/dL 67  LDL (calc) Latest Ref Range: 0-99 mg/dL 244186 (H)  VLDL Latest Ref Range: 0-40 mg/dL 15  Total CHOL/HDL Ratio Latest Units: RATIO 4.0    Current Outpatient Prescriptions  Medication Sig Dispense Refill  . aspirin EC 81 MG tablet Take 81 mg by mouth daily.    . clopidogrel (PLAVIX) 75 MG tablet Take 1 tablet (75 mg total) by mouth daily. 90 tablet 3  . clotrimazole-betamethasone (LOTRISONE) cream Apply 1 application topically daily as needed (RASH). (Patient taking differently: Apply 1 application topically daily as needed (For rash.). ) 45 g 2  . FEVERFEW PO Take 1  capsule by mouth 3 (three) times daily as needed (For migraines.).     . Insulin Human (INSULIN PUMP) SOLN Inject into the skin continuous. Basal rate .95/hr, current pump settings: 12am .45, 2am .65, 7am .95, 11am .95, 5pm .75, 11pm .5. Uses Novolog Insulin.    [name]. levothyroxine (SYNTHROID, LEVOTHROID) 137 MCG tablet Take 1 tablet (137 mcg total) by mouth daily. 90 tablet 1  . ranitidine (ZANTAC) 150 MG tablet Take 150 mg by mouth at bedtime.     . tadalafil (CIALIS) 20 MG tablet Take 1 tablet (20 mg total) by mouth daily as needed for erectile dysfunction. 10 tablet 1   No current facility-administered medications for this visit.    Allergies  Allergen Reactions  . Rosuvastatin Calcium Other (See Comments)    Problem with higher dosages (Lipitor caused memory issues), also caused leg pains and lethargy  . Statins Other (See Comments)    Problem with higher dosages (Lipitor caused memory issues), also caused leg pains and lethargy  . Tape Rash and Other (See Comments)    Adhesive tape.  (Paper tape OK)    Past Medical History  Diagnosis Date  . Diabetes mellitus Dx'd age 65    Novolog via insulin pump; sub-optimal control x years  . High cholesterol     Takes 1/2 of 80mg  atorvastatin daily  . Hypothyroidism     Thyroidectomy 2002; multinodular goiter  . Erectile dysfunction     [place]rolog[place] in [place]  . Diabetic retinopathy  Proliferative: Hx of retinal detachment on right-light perception only in right eye  . History of tobacco abuse     Quit about 1990 (has 35 pack-yr hx)  . OSA (obstructive sleep apnea)     06/2011 sleep study: moderate OSA, CPAP at 12 cm H2O.  . Impaired vision     right eye light perception only; hx of retinal detachment.  . Recurrent pneumonia   . GERD (gastroesophageal reflux disease)   . Venous insufficiency   . Diastolic dysfunction 2007; 2016    TEE with diastolic dysfunction, EF 74%.  . Diabetic neuropathy (HCC)     fine touch and position sense  affected  . Aortic stenosis, moderate Sept/Oct /2013    Mild/mod by echo; mild by cath 10/28/11.  Mild progression by echo 05/2012.  Further mild progression on 01/2014 echo (valve area 0.93 cm2).  Further progression on echo 01/2015.  Cath planned as of 06/2015 (Dr. [name])  . Carotid stenosis, right 09/2011    Dr. [name] did R carotid stenting 01/2014  . 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  Med mgmt recommended.  . Abnormal stress test 10/27/2011    lat isch--cardiac cath 10/28/2011  . Venous reflux 10/14/2011    venous doppler-R GSV continuous reflux throughout; too small for VNUS closure  . Decreased pedal pulses     LE doppler 11/08/11- no evidence of arterial insufficiency  . TIA (transient ischemic attack) 2015/16    with R carotid dz: pt got R carotid stent 01/2014 by Dr. [name] and was put on dual antiplatelet therapy with ASA  and plavix  qd afterwards  . Osteoarthritis     Bilat thumb carpometacarpal joints.  . Impingement syndrome of right shoulder   . TIA (transient ischemic attack) 05/2015    Left brain (right sided numbness + slurred speech)  Admitted for obs/workup 05/2015.    Height  (1.88 m), weight 220 lb 12.8 oz (100.154 kg).     [name] PharmD CPP [facility]

## 2015-06-18 ENCOUNTER — Telehealth: Payer: Self-pay | Admitting: *Deleted

## 2015-06-18 ENCOUNTER — Telehealth: Payer: Self-pay | Admitting: Internal Medicine

## 2015-06-18 ENCOUNTER — Ambulatory Visit
Admission: RE | Admit: 2015-06-18 | Discharge: 2015-06-18 | Disposition: A | Discharge status: Home / Self Care | Payer: PRIVATE HEALTH INSURANCE | Source: Ambulatory Visit | Attending: Cardiovascular Disease | Admitting: Cardiovascular Disease

## 2015-06-18 DIAGNOSIS — R0600 Dyspnea, unspecified: Secondary | ICD-10-CM

## 2015-06-18 DIAGNOSIS — R0609 Other forms of dyspnea: Secondary | ICD-10-CM

## 2015-06-18 DIAGNOSIS — R0989 Other specified symptoms and signs involving the circulatory and respiratory systems: Secondary | ICD-10-CM

## 2015-06-18 DIAGNOSIS — Z01818 Encounter for other preprocedural examination: Secondary | ICD-10-CM

## 2015-06-18 DIAGNOSIS — I350 Nonrheumatic aortic (valve) stenosis: Secondary | ICD-10-CM

## 2015-06-18 LAB — CBC WITH DIFFERENTIAL/PLATELET
BASOS ABS: 65 {cells}/uL (ref 0–200)
Basophils Relative: 1 %
EOS ABS: 260 {cells}/uL (ref 15–500)
Eosinophils Relative: 4 %
HCT: 42.1 % (ref 38.5–50.0)
Hemoglobin: 13.9 g/dL (ref 13.2–17.1)
LYMPHS PCT: 22 %
Lymphs Abs: 1430 cells/uL (ref 850–3900)
MCH: 31.3 pg (ref 27.0–33.0)
MCHC: 33 g/dL (ref 32.0–36.0)
MCV: 94.8 fL (ref 80.0–100.0)
MONOS PCT: 8 %
MPV: 9.5 fL (ref 7.5–12.5)
Monocytes Absolute: 520 cells/uL (ref 200–950)
NEUTROS ABS: 4225 {cells}/uL (ref 1500–7800)
NEUTROS PCT: 65 %
PLATELETS: 225 10*3/uL (ref 140–400)
RBC: 4.44 MIL/uL (ref 4.20–5.80)
RDW: 13.3 % (ref 11.0–15.0)
WBC: 6.5 10*3/uL (ref 3.8–10.8)

## 2015-06-18 LAB — PROTIME-INR
INR: 0.9
Prothrombin Time: 10.1 s (ref 9.0–11.5)

## 2015-06-18 LAB — APTT: aPTT: 26 s (ref 22–34)

## 2015-06-18 NOTE — Telephone Encounter (Signed)
Received call from patient.  He could not remember where to go for the procedure on Monday. I asked if he still had the letter. He said yes. He located on the letter where he was to go for the procedure. He verbalized understanding. He also said he had gone to get his lab work and CXR today.

## 2015-06-18 NOTE — Telephone Encounter (Signed)
[facility] called requesting a call back from us with the PT's diagnosis, # [phone]

## 2015-06-18 NOTE — Telephone Encounter (Signed)
I contacted the [facility] and advised of Dx code.

## 2015-06-19 ENCOUNTER — Encounter: Payer: Self-pay | Admitting: Family Medicine

## 2015-06-19 LAB — BASIC METABOLIC PANEL
BUN: 17 mg/dL (ref 7–25)
CHLORIDE: 103 mmol/L (ref 98–110)
CO2: 25 mmol/L (ref 20–31)
CREATININE: 0.91 mg/dL (ref 0.70–1.25)
Calcium: 8.9 mg/dL (ref 8.6–10.3)
GLUCOSE: 168 mg/dL — AB (ref 65–99)
Potassium: 4.7 mmol/L (ref 3.5–5.3)
Sodium: 141 mmol/L (ref 135–146)

## 2015-06-22 ENCOUNTER — Encounter (HOSPITAL_COMMUNITY): Payer: Self-pay | Admitting: Cardiovascular Disease

## 2015-06-22 ENCOUNTER — Encounter (HOSPITAL_COMMUNITY)
Admission: RE | Disposition: A | Discharge status: Home / Self Care | Payer: Self-pay | Source: Ambulatory Visit | Attending: Cardiovascular Disease

## 2015-06-22 ENCOUNTER — Telehealth: Payer: Self-pay | Admitting: Cardiovascular Disease

## 2015-06-22 ENCOUNTER — Ambulatory Visit (HOSPITAL_COMMUNITY)
Admission: RE | Admit: 2015-06-22 | Discharge: 2015-06-22 | Disposition: A | Discharge status: Home / Self Care | Payer: PRIVATE HEALTH INSURANCE | Source: Ambulatory Visit | Attending: Cardiovascular Disease | Admitting: Cardiovascular Disease

## 2015-06-22 ENCOUNTER — Other Ambulatory Visit: Payer: Self-pay

## 2015-06-22 DIAGNOSIS — Z87891 Personal history of nicotine dependence: Secondary | ICD-10-CM | POA: Insufficient documentation

## 2015-06-22 DIAGNOSIS — G4733 Obstructive sleep apnea (adult) (pediatric): Secondary | ICD-10-CM | POA: Diagnosis not present

## 2015-06-22 DIAGNOSIS — I350 Nonrheumatic aortic (valve) stenosis: Secondary | ICD-10-CM | POA: Diagnosis not present

## 2015-06-22 DIAGNOSIS — Z7902 Long term (current) use of antithrombotics/antiplatelets: Secondary | ICD-10-CM | POA: Diagnosis not present

## 2015-06-22 DIAGNOSIS — E119 Type 2 diabetes mellitus without complications: Secondary | ICD-10-CM | POA: Insufficient documentation

## 2015-06-22 DIAGNOSIS — Z7982 Long term (current) use of aspirin: Secondary | ICD-10-CM | POA: Insufficient documentation

## 2015-06-22 DIAGNOSIS — Z79899 Other long term (current) drug therapy: Secondary | ICD-10-CM | POA: Diagnosis not present

## 2015-06-22 DIAGNOSIS — I6521 Occlusion and stenosis of right carotid artery: Secondary | ICD-10-CM | POA: Insufficient documentation

## 2015-06-22 DIAGNOSIS — Z794 Long term (current) use of insulin: Secondary | ICD-10-CM | POA: Diagnosis not present

## 2015-06-22 DIAGNOSIS — I2510 Atherosclerotic heart disease of native coronary artery without angina pectoris: Secondary | ICD-10-CM

## 2015-06-22 DIAGNOSIS — E785 Hyperlipidemia, unspecified: Secondary | ICD-10-CM | POA: Insufficient documentation

## 2015-06-22 HISTORY — PX: CARDIAC CATHETERIZATION: SHX172

## 2015-06-22 LAB — POCT I-STAT 3, VENOUS BLOOD GAS (G3P V)
ACID-BASE EXCESS: 2 mmol/L (ref 0.0–2.0)
Bicarbonate: 28.2 mEq/L — ABNORMAL HIGH (ref 20.0–24.0)
O2 Saturation: 74 %
PH VEN: 7.354 — AB (ref 7.250–7.300)
PO2 VEN: 42 mmHg (ref 31.0–45.0)
TCO2: 30 mmol/L (ref 0–100)
pCO2, Ven: 50.8 mmHg — ABNORMAL HIGH (ref 45.0–50.0)

## 2015-06-22 LAB — POCT I-STAT 3, ART BLOOD GAS (G3+)
Acid-Base Excess: 1 mmol/L (ref 0.0–2.0)
Bicarbonate: 27.3 mEq/L — ABNORMAL HIGH (ref 20.0–24.0)
O2 Saturation: 98 %
PH ART: 7.37 (ref 7.350–7.450)
TCO2: 29 mmol/L (ref 0–100)
pCO2 arterial: 47.2 mmHg — ABNORMAL HIGH (ref 35.0–45.0)
pO2, Arterial: 114 mmHg — ABNORMAL HIGH (ref 80.0–100.0)

## 2015-06-22 LAB — GLUCOSE, CAPILLARY: GLUCOSE-CAPILLARY: 175 mg/dL — AB (ref 65–99)

## 2015-06-22 SURGERY — RIGHT/LEFT HEART CATH AND CORONARY ANGIOGRAPHY

## 2015-06-22 MED ORDER — MIDAZOLAM HCL 2 MG/2ML IJ SOLN
INTRAMUSCULAR | Status: AC
  Filled 2015-06-22: qty 2

## 2015-06-22 MED ORDER — NITROGLYCERIN 1 MG/10 ML FOR IR/CATH LAB
INTRA_ARTERIAL | Status: AC
  Filled 2015-06-22: qty 10

## 2015-06-22 MED ORDER — SODIUM CHLORIDE 0.9% FLUSH
3.0000 mL | INTRAVENOUS | Status: DC | PRN
Start: 2015-06-22 — End: 2015-06-22

## 2015-06-22 MED ORDER — HEPARIN (PORCINE) IN NACL 2-0.9 UNIT/ML-% IJ SOLN
INTRAMUSCULAR | Status: AC
  Filled 2015-06-22: qty 1000

## 2015-06-22 MED ORDER — ONDANSETRON HCL 4 MG/2ML IJ SOLN: 4.0000 mg | Freq: Four times a day (QID) | INTRAMUSCULAR | Status: DC | PRN

## 2015-06-22 MED ORDER — HYDRALAZINE HCL 20 MG/ML IJ SOLN
INTRAMUSCULAR | Status: AC
  Filled 2015-06-22: qty 1

## 2015-06-22 MED ORDER — SODIUM CHLORIDE 0.9 % WEIGHT BASED INFUSION
1.0000 mL/kg/h | INTRAVENOUS | Status: DC
Start: 2015-06-23 — End: 2015-06-22

## 2015-06-22 MED ORDER — FENTANYL CITRATE (PF) 100 MCG/2ML IJ SOLN
INTRAMUSCULAR | Status: AC
  Filled 2015-06-22: qty 2

## 2015-06-22 MED ORDER — SODIUM CHLORIDE 0.9% FLUSH: 3.0000 mL | INTRAVENOUS | Status: DC | PRN

## 2015-06-22 MED ORDER — IOPAMIDOL (ISOVUE-370) INJECTION 76%
INTRAVENOUS | Status: DC | PRN
  Administered 2015-06-22: 55 mL via INTRA_ARTERIAL

## 2015-06-22 MED ORDER — LIDOCAINE HCL (PF) 1 % IJ SOLN
INTRAMUSCULAR | Status: AC
  Filled 2015-06-22: qty 30

## 2015-06-22 MED ORDER — HEPARIN (PORCINE) IN NACL 2-0.9 UNIT/ML-% IJ SOLN
INTRAMUSCULAR | Status: DC | PRN
  Administered 2015-06-22: 1000 mL

## 2015-06-22 MED ORDER — SODIUM CHLORIDE 0.9 % WEIGHT BASED INFUSION: 1.0000 mL/kg/h | INTRAVENOUS | Status: AC

## 2015-06-22 MED ORDER — MIDAZOLAM HCL 2 MG/2ML IJ SOLN
INTRAMUSCULAR | Status: DC | PRN
  Administered 2015-06-22: 1 mg via INTRAVENOUS

## 2015-06-22 MED ORDER — ACETAMINOPHEN 325 MG PO TABS: 650.0000 mg | ORAL_TABLET | ORAL | Status: DC | PRN

## 2015-06-22 MED ORDER — LIDOCAINE HCL (PF) 1 % IJ SOLN
INTRAMUSCULAR | Status: DC | PRN
  Administered 2015-06-22: 25 mL

## 2015-06-22 MED ORDER — SODIUM CHLORIDE 0.9 % IV SOLN: 250.0000 mL | INTRAVENOUS | Status: DC | PRN

## 2015-06-22 MED ORDER — SODIUM CHLORIDE 0.9 % WEIGHT BASED INFUSION
3.0000 mL/kg/h | INTRAVENOUS | Status: DC
  Administered 2015-06-22: 3 mL/kg/h via INTRAVENOUS

## 2015-06-22 MED ORDER — ASPIRIN 81 MG PO CHEW
81.0000 mg | CHEWABLE_TABLET | ORAL | Status: AC
  Administered 2015-06-22: 81 mg via ORAL

## 2015-06-22 MED ORDER — ASPIRIN 81 MG PO CHEW
CHEWABLE_TABLET | ORAL | Status: AC
Start: 2015-06-22 — End: 2015-06-22
  Administered 2015-06-22: 81 mg via ORAL
  Filled 2015-06-22: qty 1

## 2015-06-22 MED ORDER — HYDRALAZINE HCL 20 MG/ML IJ SOLN
10.0000 mg | INTRAMUSCULAR | Status: DC | PRN
  Administered 2015-06-22: 10 mg via INTRAVENOUS

## 2015-06-22 MED ORDER — MORPHINE SULFATE (PF) 2 MG/ML IV SOLN: 2.0000 mg | INTRAVENOUS | Status: DC | PRN

## 2015-06-22 MED ORDER — FENTANYL CITRATE (PF) 100 MCG/2ML IJ SOLN
INTRAMUSCULAR | Status: DC | PRN
  Administered 2015-06-22: 25 ug via INTRAVENOUS

## 2015-06-22 MED ORDER — ASPIRIN 81 MG PO CHEW: 81.0000 mg | CHEWABLE_TABLET | Freq: Every day | ORAL | Status: DC

## 2015-06-22 MED ORDER — SODIUM CHLORIDE 0.9% FLUSH: 3.0000 mL | Freq: Two times a day (BID) | INTRAVENOUS | Status: DC

## 2015-06-22 SURGICAL SUPPLY — 10 items
CATH INFINITI 5FR MULTPACK ANG (CATHETERS) ×2 IMPLANT
CATH SWAN GANZ 7F STRAIGHT (CATHETERS) ×2 IMPLANT
KIT HEART LEFT (KITS) ×2 IMPLANT
PACK CARDIAC CATHETERIZATION (CUSTOM PROCEDURE TRAY) ×2 IMPLANT
SHEATH PINNACLE 6F 10CM (SHEATH) ×2 IMPLANT
SHEATH PINNACLE 7F 10CM (SHEATH) ×2 IMPLANT
TRANSDUCER W/STOPCOCK (MISCELLANEOUS) ×4 IMPLANT
TUBING CIL FLEX 10 FLL-RA (TUBING) ×2 IMPLANT
WIRE EMERALD 3MM-J .035X150CM (WIRE) ×2 IMPLANT
WIRE EMERALD ST .035X260CM (WIRE) ×2 IMPLANT

## 2015-06-22 NOTE — Progress Notes (Signed)
Pt takes he stop taking plavix several days ago, that's what he thought was best.  Notified Dr Allyson SabalBerry, he will wait to give Plavix when he sees what disease the patient has.

## 2015-06-22 NOTE — Discharge Instructions (Signed)

## 2015-06-22 NOTE — H&P (View-Only) (Signed)
06/12/2015 [name]   07/18/1950  [id number]  Primary Physician Jeoffrey MassedMCGOWEN,PHILIP H, MD Primary Cardiologist: Runell GessJonathan J Zelma Snead MD Roseanne RenoFACP, FACC, FAHA, FSCAI  HPI:  Mr Doneta Publicarkes is a 65 year old mildly overweight married Caucasian male, father of 3 and grandfather to 3 grandchildren, I last saw at the time of his carotid stent 12/22/14. He has a history of insulin-dependent diabetes on insulin pump, mild sleep apnea, and hyperlipidemia as well as mild aortic stenosis. He had a Myoview stress test that was abnormal which led to a heart catheterization performed by Dr. Tresa EndoKelly in my absence revealing 70% disease in all 3 vessels with preserved LV function and mild AS. He also had moderate right ICA stenosis. He was neurologically asymptomatic. He was placed on beta-blocker and Ranexa Carotid Dopplers showed stable moderate right ICA stenosis. [facility] with 2 sequential TIAs. Carotid Dopplers did show moderate right ICA stenosis. The patient was seen by Dr. Pearlean BrownieSethi, stroke neurologist, asked Dr. Imogene Burnhen, vascular surgeon for consultation. Dr. Imogene Burnhen ordered a CTA which showed high-grade ostial right internal carotid artery stenosis. I am asked to see the patient for consideration of carotid artery stenting. The patient was offered carotid endarterectomy. [name] apparently had a bad experience remotely being intubated at the time of the parathyroid operation and does not want to be reintubated again if at all possible making carotid stenting the best option for revascularization. I perform this on 01/16/14 successfully without complication. His follow-up Doppler 2 weeks later showed his carotid artery widely patent. Since I saw him 6 months ago he's been admitted with TIA type symptoms with negative workup. He also complains of chronic dizziness and increasing dyspnea on exertion. This recent 2-D echo showed increase in hisaortic stenosis now to moderately severewith a peak gradient  that has increased from 35 up to 52 mmHg.  Current Outpatient Prescriptions  Medication Sig Dispense Refill  . aspirin 81 MG tablet Take 81 mg by mouth daily.    . clopidogrel (PLAVIX) 75 MG tablet Take 1 tablet (75 mg total) by mouth daily. 90 tablet 3  . clotrimazole-betamethasone (LOTRISONE) cream Apply 1 application topically daily as needed (RASH). 45 g 2  . insulin aspart (NOVOLOG) 100 UNIT/ML injection Use up to 60 units in the insulin pump per day 6 vial 3  . Insulin Human (INSULIN PUMP) SOLN Inject into the skin continuous. Basal rate .95/hr, current pump settings: 12am .45, 2am .65, 7am .95, 11am .95, 5pm .75, 11pm .5    . levothyroxine (SYNTHROID, LEVOTHROID) 137 MCG tablet Take 1 tablet (137 mcg total) by mouth daily. 90 tablet 1  . ranitidine (ZANTAC) 150 MG tablet Take 150 mg by mouth daily as needed for heartburn.    . tadalafil (CIALIS) 20 MG tablet Take 1 tablet (20 mg total) by mouth daily as needed for erectile dysfunction. 10 tablet 1   No current facility-administered medications for this visit.    Allergies  Allergen Reactions  . Rosuvastatin Calcium Other (See Comments)    Problem with higher dosages (Lipitor caused memory issues), also caused leg pains and lethargy  . Statins Other (See Comments)    Problem with higher dosages (Lipitor caused memory issues), also caused leg pains and lethargy  . Tape Rash    Adhesive tape.  (Paper tape OK)    Social History   Social History  . Marital Status: Married    Spouse Name: N/A  . Number of Children: N/A  .  Years of Education: N/A   Occupational History  . Not on file.   Social History Main Topics  . Smoking status: Former Smoker  . Smokeless tobacco: Former User    Quit date: 06/22/1987  . Alcohol Use: 0.0 oz/week    0 Standard drinks or equivalent per week  . Drug Use: No  . Sexual Activity: Not on file   Other Topics Concern  . Not on file   Social History Narrative   Divorced x1, remarried.  Has 3  daughters from first marriage.   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  He admits he is a "workaholic".   Distant tobacco abuse (35 pack-yr hx), quit in the late 1990s, no ETOH or drugs.   No exercise.     Review of Systems: General: negative for chills, fever, night sweats or weight changes.  Cardiovascular: negative for chest pain, dyspnea on exertion, edema, orthopnea, palpitations, paroxysmal nocturnal dyspnea or shortness of breath Dermatological: negative for rash Respiratory: negative for cough or wheezing Urologic: negative for hematuria Abdominal: negative for nausea, vomiting, diarrhea, bright red blood per rectum, melena, or hematemesis Neurologic: negative for visual changes, syncope, or dizziness All other systems reviewed and are otherwise negative except as noted above.    Blood pressure 132/84, pulse 78, height 6' 2" (1.88 m), weight 221 lb 3.2 oz (100.336 kg).  General appearance: alert and no distress Neck: no adenopathy, no JVD, supple, symmetrical, trachea midline, thyroid not enlarged, symmetric, no tenderness/mass/nodules and bilateral carotid bruits Lungs: clear to auscultation bilaterally Heart: 2/6 systolic ejection murmur at the base consistent with aortic stenosis Extremities: extremities normal, atraumatic, no cyanosis or edema  EKG not performed today  ASSESSMENT AND PLAN:   Aortic stenosis, moderate History of moderate aortic stenosis with a recent 2-D echo performedv1/10/17 revealing a valve area of 0.9 cm2 with an increase in peak gradient from 35-50 mmHg.. He does admit to frequent episodes of dizziness with dyspnea on exertion. I suspect his aortic stenosis has become physiologically symptomatic. We talked about performing outpatient right and left heart cath which I'll schedule sometime in the next several weeks.  Essential hypertension History of hypertension blood  pressure measured at 132/84.He is not on antihypertensive medications  Carotid stenosis, right History of carotid artery disease status post right internal carotid artery stenting by myself 01/16/14. He has had some symptoms which sound like TIAs however they were on the contralateral side. He is on aspirin and Plavix. He has bilateral carotid bruits. I'm going to repeat carotid Dopplers.  CAD (coronary artery disease) History of coronary artery disease status post cardiac catheterization performed by Dr. Kelly revealing 70% disease in all 3 coronary arteries with preserved LV function. He really denies chest pain but does get dyspnea on exertion. In addition, he does have progressive now moderately severe aortic stenosis. Because of this I'm arranging for him to undergo cardiac catheterization to further define his anatomy and physiology.  Hyperlipidemia History of hyperlipidemia not on statin therapy because of statin intolerance with recent lipid profile performed 05/07/15 revealed a total social 268, LDL 186 and HDL of 67. I'm going to have him see Kristen to discuss PCSK9 monoclonal injectables      Arielle Eber J. Gurkirat Basher MD FACP,FACC,FAHA, FSCAI 06/12/2015 10:09 AM 

## 2015-06-22 NOTE — Progress Notes (Signed)
Site area: rt groin Site Prior to Removal:  Level 0 Pressure Applied For:  20 minutes Manual:   yes Patient Status During Pull:  stable Post Pull Site:  Level  0 Post Pull Instructions Given:  yes Post Pull Pulses Present: yes Dressing Applied:  tegaderm  Bedrest begins @ 1055 Comments:   

## 2015-06-22 NOTE — Telephone Encounter (Addendum)
06-22-15 pt calling after having cath today-per pt Dr. Allyson SabalBerry stated he would see him next week-schedule is full and he has a carotid 6-28 at 8a, would he see him after that? pls advise

## 2015-06-22 NOTE — Interval H&P Note (Signed)
Cath Lab Visit (complete for each Cath Lab visit)  Clinical Evaluation Leading to the Procedure:   ACS: No.  Non-ACS:    Anginal Classification: CCS II  Anti-ischemic medical therapy: Minimal Therapy (1 class of medications)  Non-Invasive Test Results: No non-invasive testing performed  Prior CABG: No previous CABG      History and Physical Interval Note:  06/22/2015 9:37 AM  London Pepperichard L Daponte  has presented today for surgery, with the diagnosis of cp  The various methods of treatment have been discussed with the patient and family. After consideration of risks, benefits and other options for treatment, the patient has consented to  Procedure(s): Right/Left Heart Cath and Coronary Angiography (N/A) as a surgical intervention .  The patient's history has been reviewed, patient examined, no change in status, stable for surgery.  I have reviewed the patient's chart and labs.  Questions were answered to the patient's satisfaction.     Nanetta BattyBerry, Josip Merolla

## 2015-06-23 ENCOUNTER — Encounter (HOSPITAL_COMMUNITY): Payer: Self-pay

## 2015-06-23 ENCOUNTER — Telehealth: Payer: Self-pay | Admitting: Cardiovascular Disease

## 2015-06-23 NOTE — Telephone Encounter (Signed)
Appt rescheduled for 07/01/15 at 0900 after the carotid test.

## 2015-06-23 NOTE — Telephone Encounter (Signed)
Lets try to add [name] on to my schedule the afternoon of 6/28.  JJB

## 2015-06-24 MED FILL — Nitroglycerin IV Soln 100 MCG/ML in D5W: INTRA_ARTERIAL | Qty: 10 | Status: AC

## 2015-06-25 ENCOUNTER — Institutional Professional Consult (permissible substitution) (INDEPENDENT_AMBULATORY_CARE_PROVIDER_SITE_OTHER): Payer: PRIVATE HEALTH INSURANCE | Admitting: Thoracic Surgery (Cardiothoracic Vascular Surgery)

## 2015-06-25 ENCOUNTER — Telehealth: Payer: Self-pay

## 2015-06-25 ENCOUNTER — Encounter: Payer: Self-pay | Admitting: Thoracic Surgery (Cardiothoracic Vascular Surgery)

## 2015-06-25 VITALS — BP 152/74 | HR 105 | Resp 16 | Ht 74.0 in | Wt 218.0 lb

## 2015-06-25 DIAGNOSIS — I350 Nonrheumatic aortic (valve) stenosis: Secondary | ICD-10-CM | POA: Diagnosis not present

## 2015-06-25 DIAGNOSIS — I2510 Atherosclerotic heart disease of native coronary artery without angina pectoris: Secondary | ICD-10-CM

## 2015-06-25 DIAGNOSIS — I639 Cerebral infarction, unspecified: Secondary | ICD-10-CM

## 2015-06-25 NOTE — Telephone Encounter (Addendum)
Rn receive call from Martha/ with Dr.Steven Lockie ParesHendrick regarding needing clearance for AVR/CABG on July 09, 2015 for surgery.Pt has a new patient hospital follow up in mid July 2017.  MD number is [phone].MD is only in the office only Tuesday. The [facility] time [facility] is [facility]. PAger for Dr. Lockie ParesHendrick is [phone] for Dr. Pearlean BrownieSethi to get in contact with him.Rn stated Dr. Pearlean BrownieSethi is on vacation and will be working at the hospital all next week.

## 2015-06-25 NOTE — Progress Notes (Signed)
PCP is [name], MD Referring Provider is Runell Gess, MD  Chief Complaint  Patient presents with  . Coronary Artery Disease    eval for CABG/AVR...CATH 06/22/15, ECHO 01/13/15...having carotid sturdies next Tuesday    HPI: Mr. Denk is a 65 year old man with past medical history significant for type 1 diabetes, hypertension, hyperlipidemia, coronary artery disease, aortic stenosis, thyroidectomy with resulting hypothyroidism, carotid stenosis with a previous right internal carotid stent January 2016, peripheral arterial disease, gastroesophageal reflux, venous insufficiency, obstructive sleep apnea for which he uses CPAP at night. He recently saw Dr. [name] in follow-up. An echocardiogram back in January had shown progression of his aortic stenosis from moderate to moderately severe with a mean gradient of 32 mmHg (up from 21 mmHg) and a peak gradient of 52 mmHg. His valve area was calculated at 0.9.  He mentioned to Dr. [name] that he been having shortness of breath with exertion and frequent dizzy spells with exertion. He's been getting short of breath with exertion for a couple of years but it now requires much less exertion and it did previously. He gets short of breath walking 50 feet to his mailbox and with less than one flight of stairs. He does not have any chest pain, pressure, or tightness associated with the shortness of breath. He also has been having dizziness with exertion which has worsened recently as well.  Dr. [name] recommended cardiac catheterization which was done last week. It showed severe three-vessel coronary disease. His PA pressures were normal. His calculated valve area was 1.09 cm with a mean gradient of 33 mmHg.  He was hospitalized back in May after having a series of TIA events. Initially had right tongue numbness followed by right tongue and chin numbness. He then had an episode where he had right arm and leg numbness. Although symptoms have resolved. He  does have some residual weakness in his right hand versus the left from a prior stroke.   Past Medical History  Diagnosis Date  . Diabetes mellitus Dx'd age 65    Novolog via insulin pump; sub-optimal control x years  . High cholesterol     Takes 1/2 of 80mg  atorvastatin daily  . Hypothyroidism     Thyroidectomy 2002; multinodular goiter  . Erectile dysfunction     [place]  . Diabetic retinopathy     Proliferative: Hx of retinal detachment on right-light perception only in right eye  . History of tobacco abuse     Quit about 1990 (has 35 pack-yr hx)  . OSA (obstructive sleep apnea)     06/2011 sleep study: moderate OSA, CPAP at 12 cm H2O.  . Impaired vision     right eye light perception only; hx of retinal detachment.  . Recurrent pneumonia   . GERD (gastroesophageal reflux disease)   . Venous insufficiency   . Diastolic dysfunction 2007; 2016    TEE with diastolic dysfunction, EF 74%.  . Diabetic neuropathy (HCC)     fine touch and position sense affected  . Aortic stenosis, moderate Sept/Oct /2013    Mild/mod by echo; mild by cath 10/28/11.  Mild progression by echo 05/2012.  Further mild progression on 01/2014 echo (valve area 0.93 cm2).  Further progression on echo 01/2015.  Cath planned as of 06/2015 (Dr. [name])  . Carotid stenosis, right 09/2011    Dr. [name] did R carotid stenting 01/2014  . CAD, multiple vessel 10/2011    Inferolateral perfusion defect + EKG abnormality during stress  testing prompted Cath by [facility]; 3V CAD, EF normal.  Med mgmt recommended.  . Abnormal stress test 10/27/2011    lat isch--cardiac cath 10/28/2011  . Venous reflux 10/14/2011    venous doppler-R GSV continuous reflux throughout; too small for VNUS closure  . Decreased pedal pulses     LE doppler 11/08/11- no evidence of arterial insufficiency  . TIA (transient ischemic attack) 2015/16    with R carotid dz: pt got R carotid stent 01/2014 by Dr. [name] and was put on dual antiplatelet  therapy with ASA 325mg  and plavix 75mg  qd afterwards  . Osteoarthritis     Bilat thumb carpometacarpal joints.  . Impingement syndrome of right shoulder   . TIA (transient ischemic attack) 05/2015    Left brain (right sided numbness + slurred speech)  Admitted for obs/workup 05/2015.    Past Surgical History  Procedure Laterality Date  . Thyroid surgery  2002  . Lasix eye    . Retinal detachment surgery      Right eye  . Cataract extraction      left  . Transthoracic echocardiogram  10/2011;12/2012;01/2015    Mod AS, mild LVH, EF 60-65%, no RWMA.  01/2015 EF 60-65%, grade I DD, progression of mod/sev AS--being followed by Dr. [name]  . Cardiac catheterization  10/2011    EF normal.  Diffuse 3 vessel CAD, no stents placed.  Medical mgmt per [facility].  [name] Left and right heart catheterization with coronary angiogram N/A 10/28/2011    Procedure: LEFT AND R0IHT HEART CATHETERIZATION WITH CORONARY ANGIOGRAM;  Surgeon: Lennette Bihari, MD;  Location: [facility] CATH LAB;  Service: Cardiovascular;  Laterality: N/A;  . Carotid stent insertion Right 01/16/2014    Procedure: CAROTID STENT INSERTION;  Surgeon: Runell Gess, MD;  Location: [facility] CATH LAB;  Service: Cardiovascular;  Laterality: Right;  . Eye surgery      Multiple laser surgeries for diabetic retinopathy, also cataract surgery OU.  . Cardiac catheterization N/A 06/22/2015    Procedure: Right/Left Heart Cath and Coronary Angiography;  Surgeon: Runell Gess, MD;  Location: [facility] INVASIVE CV LAB;  Service: Cardiovascular;  Laterality: N/A;    Family History  Problem Relation Age of Onset  . Cancer Mother     lung cancer  . Alcohol abuse Father   . Cancer Father     laryngeal cancer  . Cancer Brother     oldest brother had lung cancer and melanoma    Social History Social History  Substance Use Topics  . Smoking status: Former Games developer  . Smokeless tobacco: Former Neurosurgeon    Quit date: 06/22/1987  . Alcohol Use: 0.0 oz/week    0 Standard  drinks or equivalent per week    Current Outpatient Prescriptions  Medication Sig Dispense Refill  . aspirin EC 81 MG tablet Take 81 mg by mouth daily.    . clopidogrel (PLAVIX) 75 MG tablet Take 1 tablet (75 mg total) by mouth daily. 90 tablet 3  . clotrimazole-betamethasone (LOTRISONE) cream Apply 1 application topically daily as needed (RASH). (Patient taking differently: Apply 1 application topically daily as needed (For rash.). ) 45 g 2  . FEVERFEW PO Take 1 capsule by mouth 3 (three) times daily as needed (For migraines.).     . Insulin Human (INSULIN PUMP) SOLN Inject into the skin continuous. Basal rate .95/hr, current pump settings: 12am .45, 2am .65, 7am .95, 11am .95, 5pm .75, 11pm .5. Uses Novolog Insulin.    [name] levothyroxine (  SYNTHROID, LEVOTHROID) 137 MCG tablet Take 1 tablet (137 mcg total) by mouth daily. 90 tablet 1  . ranitidine (ZANTAC) 150 MG tablet Take 150 mg by mouth at bedtime.     . tadalafil (CIALIS) 20 MG tablet Take 1 tablet (20 mg total) by mouth daily as needed for erectile dysfunction. 10 tablet 1   No current facility-administered medications for this visit.    Allergies  Allergen Reactions  . Rosuvastatin Calcium Other (See Comments)    Problem with higher dosages (Lipitor caused memory issues), also caused leg pains and lethargy  . Statins Other (See Comments)    Problem with higher dosages (Lipitor caused memory issues), also caused leg pains and lethargy  . Tape Rash and Other (See Comments)    Adhesive tape.  (Paper tape OK)    Review of Systems  Constitutional: Positive for activity change and fatigue. Negative for fever, chills and unexpected weight change.  HENT: Negative for dental problem (had teeth cleaned 2 months ago), trouble swallowing and voice change.        Sore throat  Respiratory: Positive for apnea (CPAP at night) and shortness of breath.   Cardiovascular: Positive for leg swelling (R>L). Negative for chest pain and palpitations.   Gastrointestinal: Negative for blood in stool.       Heartburn  Musculoskeletal: Positive for joint swelling and arthralgias.  Neurological: Positive for dizziness, weakness (right hand) and numbness. Negative for facial asymmetry.       Memory problems  Hematological: Negative for adenopathy. Bruises/bleeds easily.  Psychiatric/Behavioral: Negative for dysphoric mood. The patient is not nervous/anxious.   All other systems reviewed and are negative.   BP 152/74 mmHg  Pulse 105  Resp 16  Ht 6\' 2"  (1.88 m)  Wt 218 lb (98.884 kg)  BMI 27.98 kg/m2  SpO2 97% Physical Exam  Constitutional: He is oriented to person, place, and time. He appears well-developed and well-nourished. No distress.  HENT:  Head: Normocephalic and atraumatic.  Mouth/Throat: No oropharyngeal exudate.  Eyes: EOM are normal. Pupils are equal, round, and reactive to light.  Neck: Neck supple. No thyromegaly (well healed thyroidectomy scar) present.  Left carotid bruit, Right suspected transmitted murmur  Cardiovascular:  Murmur (3/6 crescendo/ decrescendo) heard. Pulmonary/Chest: Effort normal and breath sounds normal. No respiratory distress. He has no wheezes. He has no rales.  Abdominal: Soft. He exhibits no distension. There is no tenderness.  Musculoskeletal: He exhibits edema (1+ right ankle).  Lymphadenopathy:    He has no cervical adenopathy.  Neurological: He is alert and oriented to person, place, and time. No cranial nerve deficit. He exhibits normal muscle tone.  Right grip slightly weaker than left, thenar wasting R>L  Skin: Skin is warm and dry.  Psychiatric: He has a normal mood and affect.  Vitals reviewed.    Diagnostic Tests: ECHOCARDIOGRAM Jan 2017 Study Conclusions  - Left ventricle: The cavity size was normal. There was mild  concentric hypertrophy. Systolic function was normal. The  estimated ejection fraction was in the range of 60% to 65%. Wall  motion was normal; there were  no regional wall motion  abnormalities. Doppler parameters are consistent with abnormal  left ventricular relaxation (grade 1 diastolic dysfunction). - Aortic valve: Valve mobility was restricted. There was moderate  stenosis. Peak velocity (S): 360 cm/s. Mean gradient (S): 32 mm  Hg.  Impressions:  - Aortic stenosis has progressed (21mmHg prior mean).  Transthoracic echocardiography. M-mode, complete 2D, spectral Doppler, and color Doppler. Birthdate: Patient  birthdate: 08-12-1950. Age: Patient is 65 yr old. Sex: Gender: male. BMI: 27.6 kg/m^2. Blood pressure: 140/64 Patient status: Outpatient. Study date: Study date: 01/13/2015. Study time: 08:36 AM.  -------------------------------------------------------------------  ------------------------------------------------------------------- Left ventricle: The cavity size was normal. There was mild concentric hypertrophy. Systolic function was normal. The estimated ejection fraction was in the range of 60% to 65%. Wall motion was normal; there were no regional wall motion abnormalities. Doppler parameters are consistent with abnormal left ventricular relaxation (grade 1 diastolic dysfunction).  ------------------------------------------------------------------- Aortic valve: Trileaflet; moderately thickened, moderately calcified leaflets. Valve mobility was restricted. Doppler: There was moderate stenosis. There was no regurgitation. VTI ratio of LVOT to aortic valve: 0.27. Valve area (VTI): 0.92 cm^2. Indexed valve area (VTI): 0.41 cm^2/m^2. Peak velocity ratio of LVOT to aortic valve: 0.25. Valve area (Vmax): 0.88 cm^2. Indexed valve area (Vmax): 0.39 cm^2/m^2. Mean velocity ratio of LVOT to aortic valve: 0.26. Valve area (Vmean): 0.89 cm^2. Indexed valve area (Vmean): 0.39 cm^2/m^2. Mean gradient (S): 32 mm Hg. Peak gradient (S): 52 mm  Hg.  ------------------------------------------------------------------- Aorta: Aortic root: The aortic root was normal in size.  CARDIAC CATHETERIZATION   Dominance: Right   Left Anterior Descending   . Prox LAD lesion, 70% stenosed.   . Mid LAD to Dist LAD lesion, 80% stenosed.     Left Circumflex   . Mid Cx lesion, 85% stenosed.   . Second Obtuse Marginal Branch   . Ost 2nd Mrg to 2nd Mrg lesion, 90% stenosed.     Right Coronary Artery   . Ost RCA to Prox RCA lesion, 60% stenosed.   . Mid RCA lesion, 80% stenosed.   . Mid RCA to Dist RCA lesion, 90% stenosed.   . Dist RCA lesion, 90% stenosed.       Right Heart Pressures Right atrial pressure-11/9 Right ventricular pressure-38/8 Pulmonary artery pressure-39/17, mean 26 Pulmonary capillary wedge pressure-A wave 20, V wave 17, mean 16 Cardiac output (Fick)-6.63 L/m, (Thermo) 6.75 L/m Aortic gradient 33 mmHg Aortic valve area 1.09 cm2    I personally reviewed the cath films and concur with the findings noted above  Impression:  [name] is a 65 year old man with type 1 diabetes, hyperlipidemia, hypertension, known coronary disease, known aortic stenosis, extracranial carotid disease, peripheral arterial disease, disequilibrium syndrome, and obstructive sleep apnea. He presents with shortness of breath on exertion and presyncope with exertion. He has not had any frank syncopal spells. At cardiac catheterization he has three-vessel coronary disease and also moderately severe aortic stenosis. Coronary artery bypass grafting is indicated for survival benefit and aortic valve replacement is appropriate at the time of bypass grafting.  I have discussed the general nature of the procedure, the need for general anesthesia, the use of cardiopulmonary bypass, and the incisions to be used with Mr and Mrs Finch. We discussed the expected hospital stay, overall recovery and short and long term outcomes. They understand the risks  include, but are not limited to death, stroke, MI, DVT/PE, bleeding, possible need for transfusion, infections, arrhythmias, heart block requiring pacemaker as well as other organ system dysfunction including respiratory, renal, or GI complications.   He accepts the risks and agrees to proceed.  We discussed the advantages and disadvantages of tissue versus mechanical valves. They understand the pros and cons of each. He wishes to avoid need for lifelong anticoagulation and opts for a tissue valve understanding the possible need for repeat operation or some other type of intervention in the future.  He will need  to be off of Plavix for 5 days prior to surgery. He has had a recent hospitalization for TIA symptoms and has a previous right carotid stent. We need Dr. Marlis EdelsonSethi's input as to the safety of aVR CABG from a neurologic standpoint, and also safety of discontinuing Plavix prior to surgery. He has an appointment with him in mid July we will see if we can move that up.  He does have some venous insufficiency in the right leg greater than the left. We will plan to use saphenous vein from the left leg if possible.  Plan:  Needs clearance from Dr. Pearlean BrownieSethi due to recent TIAs, stroke history  Carotid duplex- next Wed - will see if we can move this up  AVR/ CABG on Thursday 07/09/2015 pending above. Will stop plavix 5 days prior to surgery.  Loreli SlotSteven C Talita Recht, MD [facility] [phone]

## 2015-06-25 NOTE — Telephone Encounter (Signed)
Closed encounter °

## 2015-06-26 ENCOUNTER — Ambulatory Visit: Payer: PRIVATE HEALTH INSURANCE | Admitting: Family Medicine

## 2015-06-26 ENCOUNTER — Other Ambulatory Visit: Payer: Self-pay

## 2015-06-26 DIAGNOSIS — I25111 Atherosclerotic heart disease of native coronary artery with angina pectoris with documented spasm: Secondary | ICD-10-CM

## 2015-06-26 DIAGNOSIS — I350 Nonrheumatic aortic (valve) stenosis: Secondary | ICD-10-CM

## 2015-06-29 ENCOUNTER — Ambulatory Visit (HOSPITAL_COMMUNITY)
Admission: RE | Admit: 2015-06-29 | Discharge: 2015-06-29 | Disposition: A | Discharge status: Home / Self Care | Payer: PRIVATE HEALTH INSURANCE | Source: Ambulatory Visit | Attending: Cardiovascular Disease | Admitting: Cardiovascular Disease

## 2015-06-29 DIAGNOSIS — R0989 Other specified symptoms and signs involving the circulatory and respiratory systems: Secondary | ICD-10-CM | POA: Insufficient documentation

## 2015-06-29 DIAGNOSIS — Z87891 Personal history of nicotine dependence: Secondary | ICD-10-CM | POA: Insufficient documentation

## 2015-06-29 DIAGNOSIS — K219 Gastro-esophageal reflux disease without esophagitis: Secondary | ICD-10-CM | POA: Insufficient documentation

## 2015-06-29 DIAGNOSIS — E7800 Pure hypercholesterolemia, unspecified: Secondary | ICD-10-CM | POA: Insufficient documentation

## 2015-06-29 DIAGNOSIS — E11319 Type 2 diabetes mellitus with unspecified diabetic retinopathy without macular edema: Secondary | ICD-10-CM | POA: Diagnosis not present

## 2015-06-29 DIAGNOSIS — G4733 Obstructive sleep apnea (adult) (pediatric): Secondary | ICD-10-CM | POA: Insufficient documentation

## 2015-06-29 DIAGNOSIS — E1140 Type 2 diabetes mellitus with diabetic neuropathy, unspecified: Secondary | ICD-10-CM | POA: Diagnosis not present

## 2015-06-29 DIAGNOSIS — I6523 Occlusion and stenosis of bilateral carotid arteries: Secondary | ICD-10-CM | POA: Insufficient documentation

## 2015-06-30 ENCOUNTER — Telehealth: Payer: Self-pay | Admitting: Thoracic Surgery (Cardiothoracic Vascular Surgery)

## 2015-06-30 ENCOUNTER — Encounter: Payer: Self-pay | Admitting: Family Medicine

## 2015-06-30 HISTORY — PX: OTHER SURGICAL HISTORY: SHX169

## 2015-06-30 NOTE — Telephone Encounter (Signed)
error 

## 2015-06-30 NOTE — Telephone Encounter (Signed)
I spoke to Dr. Dorris FetchHendrickson. Patient had a TIA on 05/06/15 and is in high risk period for recurrent stroke/TIA if he holds antiplatelet therapy but since he is symptomatic from his aortic valve and coronary disease the risk-benefit is in favor of doing cardiac procedure. Patient can have cardiac surgery and hold antiplatelet therapy for few days prior and resume after the procedure if agreeable with patient

## 2015-07-01 ENCOUNTER — Other Ambulatory Visit: Payer: Self-pay | Admitting: Family Medicine

## 2015-07-01 ENCOUNTER — Ambulatory Visit (INDEPENDENT_AMBULATORY_CARE_PROVIDER_SITE_OTHER): Payer: PRIVATE HEALTH INSURANCE | Admitting: Cardiovascular Disease

## 2015-07-01 ENCOUNTER — Other Ambulatory Visit: Payer: Self-pay | Admitting: *Deleted

## 2015-07-01 ENCOUNTER — Encounter: Payer: Self-pay | Admitting: Cardiovascular Disease

## 2015-07-01 ENCOUNTER — Inpatient Hospital Stay (HOSPITAL_COMMUNITY): Admission: RE | Admit: 2015-07-01 | Payer: Self-pay | Source: Ambulatory Visit

## 2015-07-01 VITALS — BP 132/68 | HR 79 | Ht 74.0 in | Wt 217.0 lb

## 2015-07-01 DIAGNOSIS — I6521 Occlusion and stenosis of right carotid artery: Secondary | ICD-10-CM

## 2015-07-01 DIAGNOSIS — I350 Nonrheumatic aortic (valve) stenosis: Secondary | ICD-10-CM

## 2015-07-01 DIAGNOSIS — I2510 Atherosclerotic heart disease of native coronary artery without angina pectoris: Secondary | ICD-10-CM

## 2015-07-01 DIAGNOSIS — I2583 Coronary atherosclerosis due to lipid rich plaque: Secondary | ICD-10-CM

## 2015-07-01 NOTE — Assessment & Plan Note (Signed)
History of CAD status post recent cardiac catheterization performed 06/22/15 revealing three-vessel disease. He is scheduled for coronary artery bypass grafting by Dr. Dorris FetchHendrickson  on July 6.

## 2015-07-01 NOTE — Assessment & Plan Note (Signed)
History of carotid artery disease status post carotid artery stenting by myself on the right 01/16/14. He does remain on dual antiplatelet therapy which can safely be interrupted for his CABG/aVR procedure.

## 2015-07-01 NOTE — Telephone Encounter (Signed)
RF request for levothyroxine LOV: 10/09/14 Next ov: None Last written: 07/17/14 #90 w/ 1RF  10/09/14 TSH 4.22

## 2015-07-01 NOTE — Progress Notes (Signed)
07/01/2015 [name]   10/07/1950  [id number]  Primary Physician Jeoffrey MassedMCGOWEN,PHILIP H, MD Primary Cardiologist: Runell GessJonathan J Triniti Gruetzmacher MD Roseanne RenoFACP, FACC, FAHA, FSCAI  HPI:  [name] is a 65 year old mildly overweight married Caucasian male, father of 3 and grandfather to 3 grandchildren, I last saw at the time of his carotid stent 12/22/14. He has a history of insulin-dependent diabetes on insulin pump, mild sleep apnea, and hyperlipidemia as well as mild aortic stenosis. He had a Myoview stress test that was abnormal which led to a heart catheterization performed by Dr. Tresa EndoKelly in my absence revealing 70% disease in all 3 vessels with preserved LV function and mild AS. He also had moderate right ICA stenosis. He was neurologically asymptomatic. He was placed on beta-blocker and Ranexa Carotid Dopplers showed stable moderate right ICA stenosis. [facility] 2 sequential TIAs. Carotid Dopplers did show moderate right ICA stenosis. The patient was seen by Dr. Pearlean BrownieSethi, stroke neurologist, asked Dr. Imogene Burnhen, vascular surgeon for consultation. Dr. Imogene Burnhen ordered a CTA which showed high-grade ostial right internal carotid artery stenosis. I am asked to see the patient for consideration of carotid artery stenting. The patient was offered carotid endarterectomy. [name] apparently had a bad experience remotely being intubated at the time of the parathyroid operation and does not want to be reintubated again if at all possible making carotid stenting the best option for revascularization. I perform this on 01/16/14 successfully without complication. His follow-up Doppler 2 weeks later showed his carotid artery widely patent. Since I saw him 6 months ago he's been admitted with TIA type symptoms with negative workup. He also complains of chronic dizziness and increasing dyspnea on exertion. This recent 2-D echo showed increase in hisaortic stenosis now to moderately severewith a peak gradient  that has increased from 35 up to 52 mmHg. he underwent right and left heart cath by myself 06/22/15 revealing three-vessel disease with moderate aortic stenosis. His aortic valve area was 1.09 cm2 with a peak gradient of 33 mmHg. He was seen by Dr. Dorris FetchHendrickson several days later in consultation as an outpatient and scheduled for CABG/bioprosthetic AVR on July 6.  Current Outpatient Prescriptions  Medication Sig Dispense Refill  . aspirin EC 81 MG tablet Take 81 mg by mouth daily.    . clopidogrel (PLAVIX) 75 MG tablet Take 1 tablet (75 mg total) by mouth daily. 90 tablet 3  . clotrimazole-betamethasone (LOTRISONE) cream Apply 1 application topically daily as needed (RASH). (Patient taking differently: Apply 1 application topically daily as needed (For rash.). ) 45 g 2  . FEVERFEW PO Take 1 capsule by mouth daily. To prevent migraines    . Insulin Human (INSULIN PUMP) SOLN Inject into the skin continuous. Basal rate .95/hr, current pump settings: 12am .45, 2am .65, 7am .95, 11am .95, 5pm .75, 11pm .5. Uses Novolog Insulin.    . ranitidine (ZANTAC) 150 MG tablet Take 150 mg by mouth at bedtime.     . tadalafil (CIALIS) 20 MG tablet Take 1 tablet (20 mg total) by mouth daily as needed for erectile dysfunction. 10 tablet 1  . levothyroxine (SYNTHROID, LEVOTHROID) 137 MCG tablet TAKE ONE TABLET BY MOUTH ONCE DAILY 90 tablet 1   No current facility-administered medications for this visit.    Allergies  Allergen Reactions  . Rosuvastatin Calcium Other (See Comments)    Problem with higher dosages (Lipitor caused memory issues), also caused leg pains and lethargy  . Statins Other (  See Comments)    Problem with higher dosages (Lipitor caused memory issues), also caused leg pains and lethargy  . Tape Rash and Other (See Comments)    Adhesive tape.  (Paper tape OK)    Social History   Social History  . Marital Status: Married    Spouse Name: N/A  . Number of Children: N/A  . Years of Education:  N/A   Occupational History  . Not on file.   Social History Main Topics  . Smoking status: Former Games developermoker  . Smokeless tobacco: Former NeurosurgeonUser    Quit date: 06/22/1987  . Alcohol Use: 0.0 oz/week    0 Standard drinks or equivalent per week  . Drug Use: No  . Sexual Activity: Not on file   Other Topics Concern  . Not on file   Social History Narrative   Divorced x1, remarried.  Has 3 daughters from first marriage.   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   Distant tobacco abuse (35 pack-yr hx), quit in the late 1990s, no ETOH or drugs.   No exercise.     Review of Systems: General: negative for chills, fever, night sweats or weight changes.  Cardiovascular: negative for chest pain, dyspnea on exertion, edema, orthopnea, palpitations, paroxysmal nocturnal dyspnea or shortness of breath Dermatological: negative for rash Respiratory: negative for cough or wheezing Urologic: negative for hematuria Abdominal: negative for nausea, vomiting, diarrhea, bright red blood per rectum, melena, or hematemesis Neurologic: negative for visual changes, syncope, or dizziness All other systems reviewed and are otherwise negative except as noted above.    Blood pressure 132/68, pulse 79, height 6\' 2"  (1.88 m), weight 217 lb (98.431 kg).  General appearance: alert and no distress Neck: no adenopathy, no JVD, supple, symmetrical, trachea midline, thyroid not enlarged, symmetric, no tenderness/mass/nodules and Soft bilateral carotid bruits Lungs: clear to auscultation bilaterally Heart: Soft outflow tract murmur consistent with aortic stenosis Extremities: extremities normal, atraumatic, no cyanosis or edema  EKG not performed today  ASSESSMENT AND PLAN:   Aortic stenosis, moderate [name] returns today after recently having undergone right and left heart cardiac catheterization by  myself 06/22/15. He had moderate aortic stenosis with a valve area of approximately 1.1 cm2 and a peak gradient of 33 mmHg. He is symptomatic. He scheduled for bioprosthetic AVR by Dr. Dorris FetchHendrickson on July 6.  CAD (coronary artery disease) History of CAD status post recent cardiac catheterization performed 06/22/15 revealing three-vessel disease. He is scheduled for coronary artery bypass grafting by Dr. Dorris FetchHendrickson  on July 6.  Carotid stenosis History of carotid artery disease status post carotid artery stenting by myself on the right 01/16/14. He does remain on dual antiplatelet therapy which can safely be interrupted for his CABG/aVR procedure.      Runell GessJonathan J. Lafayette Dunlevy MD FACP,FACC,FAHA, [facility] Center[facility]SCAI 07/01/2015 9:14 AM

## 2015-07-01 NOTE — Patient Instructions (Signed)
Medication Instructions:  Your physician recommends that you continue on your current medications as directed. Please refer to the Current Medication list given to you today.   Labwork: none  Testing/Procedures: none  Follow-Up: Your physician recommends that you schedule a follow-up appointment in: 3 MONTHS WITH DR BERRY.   Any Other Special Instructions Will Be Listed Below (If Applicable).     If you need a refill on your cardiac medications before your next appointment, please call your pharmacy.   

## 2015-07-01 NOTE — Assessment & Plan Note (Signed)
[name] returns today after recently having undergone right and left heart cardiac catheterization by myself 06/22/15. He had moderate aortic stenosis with a valve area of approximately 1.1 cm2 and a peak gradient of 33 mmHg. He is symptomatic. He scheduled for bioprosthetic AVR by Dr. Dorris FetchHendrickson on July 6.

## 2015-07-03 ENCOUNTER — Ambulatory Visit: Payer: Self-pay | Admitting: Cardiovascular Disease

## 2015-07-03 NOTE — Pre-Procedure Instructions (Signed)
Leandrew Earmon PhoenixL Heckstall  07/03/2015      [facility] - [place], [place] - [address] [facility] [address] [place] [id number] Phone: [phone] Fax: [phone]  [id number] Ginette Otto- [place], [place] - [id number] [place] [address] Phone: [phone] Fax: [phone]  [facility] [place], [place] - [address] [place] [address] Phone: [phone] Fax: [phone]  [id number] - [place], [place] - [address] [place] [address] Phone: [phone] Fax: [phone]  [facility] - [place], [place] - [address] [place] [address] Phone: [phone] Fax: [phone]    Your procedure is scheduled on Thursday, July 09, 2015   [facility] at 6:00 A.M.   Call this number if you have problems the morning of surgery:  [phone]   Remember:  Do not eat food or drink liquids after midnight.   Take these medicines the morning of surgery with A SIP OF WATER: levothyroxine (synthroid), ranitidine (zantac)  7 days prior to surgery STOP taking any Plavix, Aspirin, Aleve, Naproxen, Ibuprofen, Motrin, Advil, Goody's, BC's, all herbal medications, fish oil, and all vitamins (feverfew)  WHAT DO I DO ABOUT MY DIABETES MEDICATION?   [name]. Do not take oral diabetes medicines (pills) the morning of surgery.  . THE NIGHT BEFORE SURGERY, Reduce basal insulin dose by 20% at midnight unless instructed other wise by primary care physician or endocrinologist.       . THE MORNING OF SURGERY, Reduce basal insulin dose by 20% unless instructed other wise by primary care physician or endocrinologist.   .     How to Manage Your Diabetes Before and After Surgery  Why is it important to control my blood sugar before and after surgery? . Improving  blood sugar levels before and after surgery helps healing and can limit problems. . A way of improving blood sugar control is eating a healthy diet by: o  Eating less sugar and carbohydrates o  Increasing activity/exercise o  Talking with your doctor about reaching your blood sugar goals . High blood sugars (greater than 180 mg/dL) can raise your risk of infections and slow your recovery, so you will need to focus on controlling your diabetes during the weeks before surgery. . Make sure that the doctor who takes care of your diabetes knows about your planned surgery including the date and location.  How do I manage my blood sugar before surgery? . Check your blood sugar at least 4 times a day, starting 2 days before surgery, to make sure that the level is not too high or low. o Check your blood sugar the morning of your surgery when you wake up and every 2 hours until you get to the Short Stay unit. . If your blood sugar is less than 70 mg/dL, you will need to treat for low blood sugar: o Do not take insulin. o Treat a low blood sugar (less than 70 mg/dL) with  cup of clear juice (cranberry or apple), 4 glucose tablets, OR glucose gel. o Recheck blood sugar in 15 minutes after treatment (to make sure it is greater than 70 mg/dL). If your blood sugar is not greater than 70 mg/dL on recheck, call [phone] for further instructions. . Report your blood sugar to the short stay nurse when you get  to Short Stay.  . If you are admitted to the hospital after surgery: o Your blood sugar will be checked by the staff and you will probably be given insulin after surgery (instead of oral diabetes medicines) to make sure you have good blood sugar levels. o The goal for blood sugar control after surgery is 80-180 mg/dL.   Do not wear jewelry, make-up or nail polish.  Do not wear lotions, powders, or perfumes.  You may wear deoderant.  Do not shave 48 hours prior to surgery.  Men may shave face and  neck.  Do not bring valuables to the hospital.  [facility] is not responsible for any belongings or valuables.  Contacts, dentures or bridgework may not be worn into surgery.  Leave your suitcase in the car.  After surgery it may be brought to your room.  For patients admitted to the hospital, discharge time will be determined by your treatment team.  Patients discharged the day of surgery will not be allowed to drive home.   Special instructions:    [facility]- Preparing For Surgery  Before surgery, you can play an important role. Because skin is not sterile, your skin needs to be as free of germs as possible. You can reduce the number of germs on your skin by washing with CHG (chlorahexidine gluconate) Soap before surgery.  CHG is an antiseptic cleaner which kills germs and bonds with the skin to continue killing germs even after washing.  Please do not use if you have an allergy to CHG or antibacterial soaps. If your skin becomes reddened/irritated stop using the CHG.  Do not shave (including legs and underarms) for at least 48 hours prior to first CHG shower. It is OK to shave your face.  Please follow these instructions carefully.   1. Shower the NIGHT BEFORE SURGERY and the MORNING OF SURGERY with CHG.   2. If you chose to wash your hair, wash your hair first as usual with your normal shampoo.  3. After you shampoo, rinse your hair and body thoroughly to remove the shampoo.  4. Use CHG as you would any other liquid soap. You can apply CHG directly to the skin and wash gently with a scrungie or a clean washcloth.   5. Apply the CHG Soap to your body ONLY FROM THE NECK DOWN.  Do not use on open wounds or open sores. Avoid contact with your eyes, ears, mouth and genitals (private parts). Wash genitals (private parts) with your normal soap.  6. Wash thoroughly, paying special attention to the area where your surgery will be performed.  7. Thoroughly rinse your body with warm  water from the neck down.  8. DO NOT shower/wash with your normal soap after using and rinsing off the CHG Soap.  9. Pat yourself dry with a CLEAN TOWEL.   10. Wear CLEAN PAJAMAS   11. Place CLEAN SHEETS on your bed the night of your first shower and DO NOT SLEEP WITH PETS.    Day of Surgery: Do not apply any deodorants/lotions. Please wear clean clothes to the hospital/surgery center.      Please read over the following fact sheets that you were given. Coughing and Deep Breathing, Blood Transfusion Information and MRSA Information, incentive spiromoter

## 2015-07-04 ENCOUNTER — Encounter: Payer: Self-pay | Admitting: Family Medicine

## 2015-07-06 ENCOUNTER — Encounter (HOSPITAL_COMMUNITY): Payer: Self-pay

## 2015-07-06 ENCOUNTER — Ambulatory Visit (HOSPITAL_COMMUNITY)
Admission: RE | Admit: 2015-07-06 | Discharge: 2015-07-06 | Disposition: A | Discharge status: Home / Self Care | Payer: PRIVATE HEALTH INSURANCE | Source: Ambulatory Visit | Attending: Thoracic Surgery (Cardiothoracic Vascular Surgery) | Admitting: Thoracic Surgery (Cardiothoracic Vascular Surgery)

## 2015-07-06 ENCOUNTER — Ambulatory Visit (HOSPITAL_BASED_OUTPATIENT_CLINIC_OR_DEPARTMENT_OTHER)
Admission: RE | Admit: 2015-07-06 | Discharge: 2015-07-06 | Disposition: A | Discharge status: Home / Self Care | Payer: PRIVATE HEALTH INSURANCE | Source: Ambulatory Visit | Attending: Thoracic Surgery (Cardiothoracic Vascular Surgery) | Admitting: Thoracic Surgery (Cardiothoracic Vascular Surgery)

## 2015-07-06 ENCOUNTER — Encounter (HOSPITAL_COMMUNITY)
Admission: RE | Admit: 2015-07-06 | Discharge: 2015-07-06 | Disposition: A | Discharge status: Home / Self Care | Payer: PRIVATE HEALTH INSURANCE | Source: Ambulatory Visit | Attending: Thoracic Surgery (Cardiothoracic Vascular Surgery) | Admitting: Thoracic Surgery (Cardiothoracic Vascular Surgery)

## 2015-07-06 VITALS — BP 137/66 | HR 91 | Temp 98.4°F | Resp 18 | Ht 74.0 in | Wt 218.9 lb

## 2015-07-06 DIAGNOSIS — E890 Postprocedural hypothyroidism: Secondary | ICD-10-CM | POA: Insufficient documentation

## 2015-07-06 DIAGNOSIS — Z01812 Encounter for preprocedural laboratory examination: Secondary | ICD-10-CM | POA: Insufficient documentation

## 2015-07-06 DIAGNOSIS — E785 Hyperlipidemia, unspecified: Secondary | ICD-10-CM

## 2015-07-06 DIAGNOSIS — Z87891 Personal history of nicotine dependence: Secondary | ICD-10-CM | POA: Insufficient documentation

## 2015-07-06 DIAGNOSIS — I350 Nonrheumatic aortic (valve) stenosis: Secondary | ICD-10-CM

## 2015-07-06 DIAGNOSIS — Z79899 Other long term (current) drug therapy: Secondary | ICD-10-CM

## 2015-07-06 DIAGNOSIS — Z8673 Personal history of transient ischemic attack (TIA), and cerebral infarction without residual deficits: Secondary | ICD-10-CM

## 2015-07-06 DIAGNOSIS — I25111 Atherosclerotic heart disease of native coronary artery with angina pectoris with documented spasm: Secondary | ICD-10-CM

## 2015-07-06 DIAGNOSIS — I872 Venous insufficiency (chronic) (peripheral): Secondary | ICD-10-CM | POA: Insufficient documentation

## 2015-07-06 DIAGNOSIS — Z7982 Long term (current) use of aspirin: Secondary | ICD-10-CM | POA: Insufficient documentation

## 2015-07-06 DIAGNOSIS — K219 Gastro-esophageal reflux disease without esophagitis: Secondary | ICD-10-CM | POA: Insufficient documentation

## 2015-07-06 DIAGNOSIS — E1040 Type 1 diabetes mellitus with diabetic neuropathy, unspecified: Secondary | ICD-10-CM | POA: Insufficient documentation

## 2015-07-06 DIAGNOSIS — Z9641 Presence of insulin pump (external) (internal): Secondary | ICD-10-CM | POA: Insufficient documentation

## 2015-07-06 DIAGNOSIS — Z01818 Encounter for other preprocedural examination: Secondary | ICD-10-CM

## 2015-07-06 DIAGNOSIS — I2510 Atherosclerotic heart disease of native coronary artery without angina pectoris: Secondary | ICD-10-CM

## 2015-07-06 DIAGNOSIS — Z0183 Encounter for blood typing: Secondary | ICD-10-CM

## 2015-07-06 DIAGNOSIS — Z7902 Long term (current) use of antithrombotics/antiplatelets: Secondary | ICD-10-CM | POA: Insufficient documentation

## 2015-07-06 DIAGNOSIS — I10 Essential (primary) hypertension: Secondary | ICD-10-CM

## 2015-07-06 DIAGNOSIS — G4733 Obstructive sleep apnea (adult) (pediatric): Secondary | ICD-10-CM | POA: Insufficient documentation

## 2015-07-06 DIAGNOSIS — E10319 Type 1 diabetes mellitus with unspecified diabetic retinopathy without macular edema: Secondary | ICD-10-CM

## 2015-07-06 HISTORY — DX: Other complications of anesthesia, initial encounter: T88.59XA

## 2015-07-06 HISTORY — DX: Failed or difficult intubation, initial encounter: T88.4XXA

## 2015-07-06 HISTORY — DX: Cardiac murmur, unspecified: R01.1

## 2015-07-06 HISTORY — DX: Dizziness and giddiness: R42

## 2015-07-06 HISTORY — DX: Reserved for inherently not codable concepts without codable children: IMO0001

## 2015-07-06 HISTORY — DX: Adverse effect of unspecified anesthetic, initial encounter: T41.45XA

## 2015-07-06 HISTORY — DX: Cerebral infarction, unspecified: I63.9

## 2015-07-06 HISTORY — DX: Myoneural disorder, unspecified: G70.9

## 2015-07-06 LAB — URINALYSIS, ROUTINE W REFLEX MICROSCOPIC
BILIRUBIN URINE: NEGATIVE
Glucose, UA: >1000 mg/dL — AB
Hgb urine dipstick: NEGATIVE
KETONES UR: NEGATIVE mg/dL
LEUKOCYTES UA: NEGATIVE
NITRITE: NEGATIVE
PH: 5.5 (ref 5.0–8.0)
PROTEIN: NEGATIVE mg/dL
Specific Gravity, Urine: 1.019 (ref 1.005–1.030)

## 2015-07-06 LAB — PULMONARY FUNCTION TEST
DL/VA % PRED: 85 %
DL/VA: 4.14 ml/min/mmHg/L
DLCO UNC: 23.58 ml/min/mmHg
DLCO unc % pred: 62 %
FEF 25-75 POST: 2.03 L/s
FEF 25-75 Pre: 1.9 L/sec
FEF2575-%Change-Post: 7 %
FEF2575-%PRED-POST: 67 %
FEF2575-%Pred-Pre: 62 %
FEV1-%Change-Post: 2 %
FEV1-%PRED-PRE: 73 %
FEV1-%Pred-Post: 74 %
FEV1-PRE: 2.85 L
FEV1-Post: 2.92 L
FEV1FVC-%Change-Post: 5 %
FEV1FVC-%PRED-PRE: 94 %
FEV6-%Change-Post: -3 %
FEV6-%PRED-PRE: 80 %
FEV6-%Pred-Post: 77 %
FEV6-POST: 3.88 L
FEV6-PRE: 4.02 L
FEV6FVC-%Change-Post: 0 %
FEV6FVC-%Pred-Post: 104 %
FEV6FVC-%Pred-Pre: 104 %
FVC-%Change-Post: -3 %
FVC-%PRED-POST: 75 %
FVC-%PRED-PRE: 77 %
FVC-POST: 3.94 L
FVC-Pre: 4.06 L
POST FEV1/FVC RATIO: 74 %
PRE FEV6/FVC RATIO: 99 %
Post FEV6/FVC ratio: 99 %
Pre FEV1/FVC ratio: 70 %
RV % pred: 78 %
RV: 2.03 L
TLC % pred: 78 %
TLC: 6.13 L

## 2015-07-06 LAB — BLOOD GAS, ARTERIAL
Acid-Base Excess: 2.5 mmol/L — ABNORMAL HIGH (ref 0.0–2.0)
BICARBONATE: 26.9 meq/L — AB (ref 20.0–24.0)
Drawn by: 449841
FIO2: 0.21
O2 Saturation: 97.3 %
PCO2 ART: 44.4 mmHg (ref 35.0–45.0)
PH ART: 7.4 (ref 7.350–7.450)
PO2 ART: 93.9 mmHg (ref 80.0–100.0)
Patient temperature: 98.6
TCO2: 28.3 mmol/L (ref 0–100)

## 2015-07-06 LAB — URINE MICROSCOPIC-ADD ON: RBC / HPF: NONE SEEN RBC/hpf (ref 0–5)

## 2015-07-06 LAB — PROTIME-INR
INR: 0.91 (ref 0.00–1.49)
PROTHROMBIN TIME: 12.5 s (ref 11.6–15.2)

## 2015-07-06 LAB — CBC
HEMATOCRIT: 41.8 % (ref 39.0–52.0)
Hemoglobin: 14.1 g/dL (ref 13.0–17.0)
MCH: 31.3 pg (ref 26.0–34.0)
MCHC: 33.7 g/dL (ref 30.0–36.0)
MCV: 92.7 fL (ref 78.0–100.0)
Platelets: 235 10*3/uL (ref 150–400)
RBC: 4.51 MIL/uL (ref 4.22–5.81)
RDW: 12.4 % (ref 11.5–15.5)
WBC: 7.3 10*3/uL (ref 4.0–10.5)

## 2015-07-06 LAB — COMPREHENSIVE METABOLIC PANEL
ALT: 18 U/L (ref 17–63)
ANION GAP: 7 (ref 5–15)
AST: 22 U/L (ref 15–41)
Albumin: 3.5 g/dL (ref 3.5–5.0)
Alkaline Phosphatase: 78 U/L (ref 38–126)
BILIRUBIN TOTAL: 1 mg/dL (ref 0.3–1.2)
BUN: 14 mg/dL (ref 6–20)
CALCIUM: 8.7 mg/dL — AB (ref 8.9–10.3)
CO2: 24 mmol/L (ref 22–32)
Chloride: 102 mmol/L (ref 101–111)
Creatinine, Ser: 0.91 mg/dL (ref 0.61–1.24)
GFR calc Af Amer: >60 mL/min (ref >60)
Glucose, Bld: 367 mg/dL — ABNORMAL HIGH (ref 65–99)
POTASSIUM: 4.7 mmol/L (ref 3.5–5.1)
Sodium: 133 mmol/L — ABNORMAL LOW (ref 135–145)
TOTAL PROTEIN: 6.6 g/dL (ref 6.5–8.1)

## 2015-07-06 LAB — ABO/RH: ABO/RH(D): A POS

## 2015-07-06 LAB — TYPE AND SCREEN
ABO/RH(D): A POS
ANTIBODY SCREEN: NEGATIVE

## 2015-07-06 LAB — SURGICAL PCR SCREEN
MRSA, PCR: NEGATIVE
STAPHYLOCOCCUS AUREUS: NEGATIVE

## 2015-07-06 LAB — GLUCOSE, CAPILLARY: GLUCOSE-CAPILLARY: 289 mg/dL — AB (ref 65–99)

## 2015-07-06 LAB — APTT: aPTT: 29 seconds (ref 24–37)

## 2015-07-06 MED ORDER — ALBUTEROL SULFATE (2.5 MG/3ML) 0.083% IN NEBU
2.5000 mg | INHALATION_SOLUTION | Freq: Once | RESPIRATORY_TRACT | Status: AC
  Administered 2015-07-06: 2.5 mg via RESPIRATORY_TRACT

## 2015-07-06 NOTE — Progress Notes (Signed)
Darius Bumpyan Brooks, RN at Dr. Sunday CornHendrickson's office made aware of blood glucose.

## 2015-07-06 NOTE — Progress Notes (Addendum)
Anesthesia PAT Evaluation: Patient is a 65 year old male scheduled for CABG/AVR 07/09/15 by Dr. Dorris FetchHendrickson.   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 He reports DIFFICULT AIRWAY in the setting of coughing and bleeding in PACU following thyroidectomy. He said he had to remain intubated for ~ 1 week following the thyroidectomy take-back for bleeding surgery. He doesn't remember many other details. He denied post-operative dysphagia but has noticed a slight change (more gravelly) since his thyroid surgery. He works at [employer].  [facility] Dates cannot be read on fax. - Thyroidectomy: IV induction but difficulty maintaining mask seal (full beard). Oral and nasal airways placed. DL X 1 with cricoid pressure to help visualize cords. 7.5 oral ETT placed. Documented as difficult intubation and difficult mask. - Neck exploration: IV induction. Fiberoptic intubation attempted by Dr. Orson SlickBowman. Unable to visualize cords. Two handed ______ required for ventilation. Attempt X 3 to visualize cords, unsuccessful. Dr. Sharyl NimrodMeredith to attempt FOP. Attempt X 2. 7.0 ETT thru cords. Extremely difficult fiberoptic intubation, unsuccessful asleep. Awake ______ X 6 --> Dr. Sharyl NimrodMeredith passed ETT on 4th attempt ______. - Vitrectomy, right eye: Easy oral intubation with direct visualization, 7.0 ETT. Easy mask.     PCP is. Dr. Milinda CaveMcGowen. Cardiologist is Dr. Allyson SabalBerry.  Endocrinologist is Dr. Wyonia HoughGerghe. Neurologist is Dr. Pearlean BrownieSethi. Per telephone encounter 06/30/15, "I spoke to Dr. Dorris FetchHendrickson. Patient had a TIA on 05/06/15 and is in high risk period for recurrent stroke/TIA if he  holds antiplatelet therapy but since he is symptomatic from his aortic valve and coronary disease the risk-benefit is in favor of doing cardiac procedure. Patient can have cardiac surgery and hold antiplatelet therapy for few days prior and resume after the procedure if agreeable with patient."  Meds include ASA 81 mg (hold 07/06/15), Plavix (last dose 07/03/15), levothyroxine, ranitidine, Cialis. He has an insulin pump.  PAT Vitals: BP 137/66, HR 91, RR 18, T 36.9C, O2 sat 96%. CBG 289. Exam shows a pleasant Caucasian male in NAD. Mallampati I-II. Denied loose teeth or dentures. Reports one cap on a molar. Neck extension okay.   06/22/15 EKG: NSR, non-specific ST/T wave abnormality.  06/22/15 Cardiac cath:  Suezanne Jacquetst 2nd Mrg to 2nd Mrg lesion, 90% stenosed.  Mid Cx lesion, 85% stenosed.  Prox LAD lesion, 70% stenosed.  Mid LAD to Dist LAD lesion, 80% stenosed.  Ost RCA to Prox RCA lesion, 60% stenosed.  Mid RCA lesion, 80% stenosed.  Mid RCA to Dist RCA lesion, 90% stenosed.  Dist RCA lesion, 90% stenosed.  01/13/15 Echo: Study Conclusions - Left ventricle: The cavity size was normal. There was mild  concentric hypertrophy. Systolic function was normal. The  estimated ejection fraction was in the range of 60% to 65%. Wall  motion was normal; there were no regional wall motion  abnormalities. Doppler parameters are consistent with abnormal  left ventricular relaxation (grade 1 diastolic dysfunction). - Aortic valve: Valve mobility was restricted. There was moderate  stenosis. Peak velocity (S): 360 cm/s. Mean gradient (S): 32 mm  Hg. Impressions: - Aortic stenosis has progressed (21mmHg prior mean).  05/13/13-05/26/13 Event monitor: NSR.  06/29/15 Carotid U/S: Heterogeneous plaque, bilaterally. Stable 1-39% BICA stenosis, s/p right ICA stent. Stable >  50% RECA stenosis. Normal SCA, bilaterally. Patent vertebral arteries with antegrade flow.   05/07/15 MRI Brain: IMPRESSION: 1.  Normal MRI the brain for age. 2. Stable chronic changes in the vitreous of the right globe.  05/06/15 CTA head/neck: IMPRESSION: 1. Interval placement of stent across the right carotid bifurcation. The stent appears patent. The distal right ICA demonstrates a slightly larger diameter than on the prior exam, suggesting improved flow. 2. Stable atherosclerotic disease at the left carotid bifurcation without a significant stenosis relative to the more distal vessel. 3. Dense atherosclerotic calcifications within the cavernous internal carotid arteries bilaterally without significant stenosis or change. 4. No significant atherosclerotic calcification or stenosis involving the posterior circulation. 5. ACA and MCA branch vessels are within normal limits.  07/06/15 CXR: IMPRESSION: There is no active cardiopulmonary disease. Aortic atherosclerosis.  07/06/15 PFTS: FVC 4.06 (77%), FEV1 2.85 (73%), DLCOunc 23.58 (62%).  Preoperative labs noted. Glucose 367. Cr 0.91. CBC, PT/PTT WNL. A1c in process.  Patient requested to speak with an anesthesiologist as well today. He was seen by Dr. Maple HudsonMoser. ([facility] were not received until after patient's PAT visit, but were reviewed with Dr. Maple HudsonMoser as well.) Patient with difficult airway history, but most notably in the setting of bleeding following thyroidectomy. Dr. Maple HudsonMoser thought Mallampati was 1, slightly anterior. Although it may be possible to perform ETT without advanced airway equipment, he recommended using Glidescope to visualize ETT placement and evaluate for any vocal cord issues since patient has had voice changes since his thyroidectomy. If any issues noted with vocal cord opening then would consider ENT referral in the future. Definitive anesthesia plan following evaluation of patient by his assigned anesthesiologist on the day of surgery. [facility] anesthesia records are on his paper chart for review as needed.   Will leave chart for  follow-up regarding A1c results.   Velna Ochsllison Zelenak, PA-C [facility]esthesiology Phone [phone] 07/06/2015 5:12 PM  Addendum: A1c 8.4 (down from 9.2 on 05/07/15), consistent with mean plasma glucose of 194. Marked as reviewed by Dr. Dorris FetchHendrickson in Epic. He has an insulin with basal rate to be decreased by 20% at midnight before surgery. Fasting CBG on arrival with plans for IV insulin intra-operative. Boneta LucksJenny, RN DM Educator notified of planned admission.  Velna Ochsllison Zelenak, PA-C [facility]Anesthesiology Phone [phone] 07/08/2015 9:58 AM

## 2015-07-06 NOTE — Progress Notes (Signed)
Pre-op Cardiac Surgery  Carotid Findings:  Carotid done on 06/29/15 at [facility] - Bilateral 1-39% 150  Upper Extremity Right Left  Brachial Pressures 150  151  Radial Waveforms Triphasic  Triphasic  Ulnar Waveforms Triphasic  Triphasic  Palmar Arch (Allen's Test)  WNL WNL   Findings:     Lower  Extremity Right Left  Dorsalis Pedis 120 104  Posterior Tibial 108 107  Ankle/Brachial Indices 0.79 0.71    Findings:  Bilateral moderate arterial insufficiency disease at rest. Significant decreased from prior study dated 01/13/15.

## 2015-07-06 NOTE — Progress Notes (Signed)
PCP - Nicoletta BaPhilip McGowen Cardiologist - Nanetta BattyJonathan Berry Endocrinologist - Tera Helperhristina Gerghe  Chest x-ray - 07/06/15 pending EKG - 6-19/17 Stress Test - 10/27/11 ECHO - 01/13/15 Cardiac Cath - 06/22/15    Patient denies shortness of breath and chest pain at PAT appointment  Patient has PFT 07/06/15 at 2pm  Dopplers 07/06/15 at 3 pm  Patient is on an insulin pump.  He checks his blood sugar 5-6 times a day.  His basal rate is set at 1.1 He has had insulin pump for 7-8 years  Patient is positive for sleep apnea and wears a CPAP.  Unsure of settings at this time  Patient's last dose of Plavix was Friday 07/03/15

## 2015-07-06 NOTE — Anesthesia Preprocedure Evaluation (Addendum)
Anesthesia Evaluation  Patient identified by MRN, date of birth, ID band Patient awake    Reviewed: Allergy & Precautions, NPO status , Patient's Chart, lab work & pertinent test results  History of Anesthesia Complications (+) DIFFICULT AIRWAY and history of anesthetic complications  Airway Mallampati: II  TM Distance: >3 FB Neck ROM: Full    Dental  (+) Teeth Intact, Dental Advisory Given   Pulmonary sleep apnea and Continuous Positive Airway Pressure Ventilation , former smoker,    breath sounds clear to auscultation       Cardiovascular hypertension, + CAD and + Peripheral Vascular Disease  + Valvular Problems/Murmurs AS  Rhythm:Regular Rate:Normal     Neuro/Psych TIA Neuromuscular disease CVA negative psych ROS   GI/Hepatic Neg liver ROS, GERD  Medicated,  Endo/Other  diabetes, Type 1, Insulin DependentHypothyroidism   Renal/GU negative Renal ROS  negative genitourinary   Musculoskeletal  (+) Arthritis , Osteoarthritis,    Abdominal   Peds negative pediatric ROS (+)  Hematology   Anesthesia Other Findings   Reproductive/Obstetrics negative OB ROS                           Lab Results  Component Value Date   WBC 7.3 07/06/2015   HGB 14.1 07/06/2015   HCT 41.8 07/06/2015   MCV 92.7 07/06/2015   PLT 235 07/06/2015   Lab Results  Component Value Date   CREATININE 0.91 07/06/2015   BUN 14 07/06/2015   NA 133* 07/06/2015   K 4.7 07/06/2015   CL 102 07/06/2015   CO2 24 07/06/2015   Lab Results  Component Value Date   INR 0.91 07/06/2015   INR 0.9 06/18/2015   INR 0.93 05/06/2015    06/2015: EKG: NSR  01/2015 ECHO  - Left ventricle: The cavity size was normal. There was mild concentric hypertrophy. Systolic function was normal. The estimated ejection fraction was in the range of 60% to 65%. Wall motion was normal; there were no regional wall motion abnormalities. Doppler  parameters are consistent with abnormal left ventricular relaxation (grade 1 diastolic dysfunction). - Aortic valve: Valve mobility was restricted. There was moderate stenosis. Peak velocity (S): 360 cm/s. Mean gradient (S): 32 mm Hg.    Anesthesia Physical Anesthesia Plan  ASA: IV  Anesthesia Plan: General   Post-op Pain Management:    Induction: Intravenous  Airway Management Planned: Oral ETT and Video Laryngoscope Planned  Additional Equipment: Arterial line, CVP, PA Cath, TEE and Ultrasound Guidance Line Placement  Intra-op Plan:   Post-operative Plan: Post-operative intubation/ventilation  Informed Consent: I have reviewed the patients History and Physical, chart, labs and discussed the procedure including the risks, benefits and alternatives for the proposed anesthesia with the patient or authorized representative who has indicated his/her understanding and acceptance.   Dental advisory given  Plan Discussed with: CRNA  Anesthesia Plan Comments: (See highlighted paragraph in my pre-op note. Shonna ChockAllison Zelenak, PA-C)       Anesthesia Quick Evaluation

## 2015-07-07 LAB — HEMOGLOBIN A1C
Hgb A1c MFr Bld: 8.4 % — ABNORMAL HIGH (ref 4.8–5.6)
MEAN PLASMA GLUCOSE: 194 mg/dL

## 2015-07-08 ENCOUNTER — Encounter (HOSPITAL_COMMUNITY): Payer: Self-pay | Admitting: Certified Registered Nurse Anesthetist

## 2015-07-08 MED ORDER — NITROGLYCERIN IN D5W 200-5 MCG/ML-% IV SOLN
2.0000 ug/min | INTRAVENOUS | Status: AC
  Administered 2015-07-09: 5 ug/min via INTRAVENOUS
  Filled 2015-07-08: qty 250

## 2015-07-08 MED ORDER — PHENYLEPHRINE HCL 10 MG/ML IJ SOLN
30.0000 ug/min | INTRAVENOUS | Status: DC
  Filled 2015-07-08: qty 2

## 2015-07-08 MED ORDER — PLASMA-LYTE 148 IV SOLN
INTRAVENOUS | Status: AC
  Administered 2015-07-09: 500 mL
  Filled 2015-07-08: qty 2.5

## 2015-07-08 MED ORDER — SODIUM CHLORIDE 0.9 % IV SOLN
INTRAVENOUS | Status: DC
  Filled 2015-07-08: qty 30

## 2015-07-08 MED ORDER — DEXTROSE 5 % IV SOLN
1.5000 g | INTRAVENOUS | Status: AC
  Administered 2015-07-09: .75 g via INTRAVENOUS
  Administered 2015-07-09: 1.5 g via INTRAVENOUS
  Filled 2015-07-08 (×2): qty 1.5

## 2015-07-08 MED ORDER — CHLORHEXIDINE GLUCONATE 0.12 % MT SOLN
15.0000 mL | Freq: Once | OROMUCOSAL | Status: AC
  Administered 2015-07-09: 15 mL via OROMUCOSAL
  Filled 2015-07-08: qty 15

## 2015-07-08 MED ORDER — DEXMEDETOMIDINE HCL IN NACL 400 MCG/100ML IV SOLN
0.1000 ug/kg/h | INTRAVENOUS | Status: DC
  Filled 2015-07-08: qty 100

## 2015-07-08 MED ORDER — POTASSIUM CHLORIDE 2 MEQ/ML IV SOLN
80.0000 meq | INTRAVENOUS | Status: DC
  Filled 2015-07-08: qty 40

## 2015-07-08 MED ORDER — DOPAMINE-DEXTROSE 3.2-5 MG/ML-% IV SOLN
0.0000 ug/kg/min | INTRAVENOUS | Status: AC
  Administered 2015-07-09: 3 ug/kg/min via INTRAVENOUS
  Filled 2015-07-08: qty 250

## 2015-07-08 MED ORDER — METOPROLOL TARTRATE 12.5 MG HALF TABLET
12.5000 mg | ORAL_TABLET | Freq: Once | ORAL | Status: AC
  Administered 2015-07-09: 12.5 mg via ORAL
  Filled 2015-07-08: qty 1

## 2015-07-08 MED ORDER — DEXTROSE 5 % IV SOLN
750.0000 mg | INTRAVENOUS | Status: DC
  Filled 2015-07-08: qty 750

## 2015-07-08 MED ORDER — EPINEPHRINE HCL 1 MG/ML IJ SOLN
0.0000 ug/min | INTRAVENOUS | Status: DC
  Filled 2015-07-08: qty 4

## 2015-07-08 MED ORDER — VANCOMYCIN HCL 10 G IV SOLR
1500.0000 mg | INTRAVENOUS | Status: AC
  Administered 2015-07-09: 1500 mg via INTRAVENOUS
  Filled 2015-07-08: qty 1500

## 2015-07-08 MED ORDER — SODIUM CHLORIDE 0.9 % IV SOLN
INTRAVENOUS | Status: AC
  Administered 2015-07-09: 70 mL/h via INTRAVENOUS
  Filled 2015-07-08: qty 40

## 2015-07-08 MED ORDER — SODIUM CHLORIDE 0.9 % IV SOLN
INTRAVENOUS | Status: AC
  Administered 2015-07-09: 1.2 [IU]/h via INTRAVENOUS
  Filled 2015-07-08: qty 2.5

## 2015-07-08 MED ORDER — MAGNESIUM SULFATE 50 % IJ SOLN
40.0000 meq | INTRAMUSCULAR | Status: DC
  Filled 2015-07-08: qty 10

## 2015-07-09 ENCOUNTER — Inpatient Hospital Stay (HOSPITAL_COMMUNITY): Payer: PRIVATE HEALTH INSURANCE

## 2015-07-09 ENCOUNTER — Inpatient Hospital Stay (HOSPITAL_COMMUNITY)
Admission: RE | Admit: 2015-07-09 | Discharge: 2015-07-14 | DRG: 220 | Disposition: A | Discharge status: Home / Self Care | Payer: PRIVATE HEALTH INSURANCE | Source: Ambulatory Visit | Attending: Thoracic Surgery (Cardiothoracic Vascular Surgery) | Admitting: Thoracic Surgery (Cardiothoracic Vascular Surgery)

## 2015-07-09 ENCOUNTER — Encounter (HOSPITAL_COMMUNITY)
Admission: RE | Disposition: A | Discharge status: Home / Self Care | Payer: Self-pay | Source: Ambulatory Visit | Attending: Thoracic Surgery (Cardiothoracic Vascular Surgery)

## 2015-07-09 ENCOUNTER — Inpatient Hospital Stay (HOSPITAL_COMMUNITY): Payer: PRIVATE HEALTH INSURANCE | Admitting: Anesthesiology

## 2015-07-09 ENCOUNTER — Inpatient Hospital Stay (HOSPITAL_COMMUNITY): Payer: PRIVATE HEALTH INSURANCE | Admitting: Vascular Surgery

## 2015-07-09 DIAGNOSIS — E1040 Type 1 diabetes mellitus with diabetic neuropathy, unspecified: Secondary | ICD-10-CM | POA: Diagnosis present

## 2015-07-09 DIAGNOSIS — Z888 Allergy status to other drugs, medicaments and biological substances status: Secondary | ICD-10-CM | POA: Diagnosis not present

## 2015-07-09 DIAGNOSIS — Z951 Presence of aortocoronary bypass graft: Secondary | ICD-10-CM

## 2015-07-09 DIAGNOSIS — Z794 Long term (current) use of insulin: Secondary | ICD-10-CM

## 2015-07-09 DIAGNOSIS — E1065 Type 1 diabetes mellitus with hyperglycemia: Secondary | ICD-10-CM | POA: Diagnosis present

## 2015-07-09 DIAGNOSIS — Z801 Family history of malignant neoplasm of trachea, bronchus and lung: Secondary | ICD-10-CM

## 2015-07-09 DIAGNOSIS — I350 Nonrheumatic aortic (valve) stenosis: Secondary | ICD-10-CM | POA: Diagnosis present

## 2015-07-09 DIAGNOSIS — G4733 Obstructive sleep apnea (adult) (pediatric): Secondary | ICD-10-CM | POA: Diagnosis present

## 2015-07-09 DIAGNOSIS — J9811 Atelectasis: Secondary | ICD-10-CM

## 2015-07-09 DIAGNOSIS — Z8701 Personal history of pneumonia (recurrent): Secondary | ICD-10-CM | POA: Diagnosis not present

## 2015-07-09 DIAGNOSIS — Z419 Encounter for procedure for purposes other than remedying health state, unspecified: Secondary | ICD-10-CM

## 2015-07-09 DIAGNOSIS — E785 Hyperlipidemia, unspecified: Secondary | ICD-10-CM | POA: Diagnosis present

## 2015-07-09 DIAGNOSIS — Z808 Family history of malignant neoplasm of other organs or systems: Secondary | ICD-10-CM

## 2015-07-09 DIAGNOSIS — I119 Hypertensive heart disease without heart failure: Secondary | ICD-10-CM | POA: Diagnosis present

## 2015-07-09 DIAGNOSIS — E10319 Type 1 diabetes mellitus with unspecified diabetic retinopathy without macular edema: Secondary | ICD-10-CM | POA: Diagnosis present

## 2015-07-09 DIAGNOSIS — Z7982 Long term (current) use of aspirin: Secondary | ICD-10-CM | POA: Diagnosis not present

## 2015-07-09 DIAGNOSIS — E7800 Pure hypercholesterolemia, unspecified: Secondary | ICD-10-CM | POA: Diagnosis present

## 2015-07-09 DIAGNOSIS — E8770 Fluid overload, unspecified: Secondary | ICD-10-CM | POA: Diagnosis not present

## 2015-07-09 DIAGNOSIS — Z802 Family history of malignant neoplasm of other respiratory and intrathoracic organs: Secondary | ICD-10-CM

## 2015-07-09 DIAGNOSIS — Z8673 Personal history of transient ischemic attack (TIA), and cerebral infarction without residual deficits: Secondary | ICD-10-CM

## 2015-07-09 DIAGNOSIS — Z91048 Other nonmedicinal substance allergy status: Secondary | ICD-10-CM | POA: Diagnosis not present

## 2015-07-09 DIAGNOSIS — E1051 Type 1 diabetes mellitus with diabetic peripheral angiopathy without gangrene: Secondary | ICD-10-CM | POA: Diagnosis present

## 2015-07-09 DIAGNOSIS — I872 Venous insufficiency (chronic) (peripheral): Secondary | ICD-10-CM | POA: Diagnosis present

## 2015-07-09 DIAGNOSIS — E890 Postprocedural hypothyroidism: Secondary | ICD-10-CM | POA: Diagnosis present

## 2015-07-09 DIAGNOSIS — Z87891 Personal history of nicotine dependence: Secondary | ICD-10-CM

## 2015-07-09 DIAGNOSIS — Z9641 Presence of insulin pump (external) (internal): Secondary | ICD-10-CM | POA: Diagnosis present

## 2015-07-09 DIAGNOSIS — I2510 Atherosclerotic heart disease of native coronary artery without angina pectoris: Principal | ICD-10-CM

## 2015-07-09 DIAGNOSIS — H547 Unspecified visual loss: Secondary | ICD-10-CM | POA: Diagnosis present

## 2015-07-09 DIAGNOSIS — Z952 Presence of prosthetic heart valve: Secondary | ICD-10-CM

## 2015-07-09 DIAGNOSIS — K219 Gastro-esophageal reflux disease without esophagitis: Secondary | ICD-10-CM | POA: Diagnosis present

## 2015-07-09 DIAGNOSIS — D62 Acute posthemorrhagic anemia: Secondary | ICD-10-CM | POA: Diagnosis not present

## 2015-07-09 DIAGNOSIS — Z954 Presence of other heart-valve replacement: Secondary | ICD-10-CM | POA: Diagnosis not present

## 2015-07-09 DIAGNOSIS — Z09 Encounter for follow-up examination after completed treatment for conditions other than malignant neoplasm: Secondary | ICD-10-CM

## 2015-07-09 HISTORY — PX: AORTIC VALVE REPLACEMENT: SHX41

## 2015-07-09 HISTORY — PX: CORONARY ARTERY BYPASS GRAFT: SHX141

## 2015-07-09 HISTORY — PX: INTRAOPERATIVE TRANSESOPHAGEAL ECHOCARDIOGRAM: SHX5062

## 2015-07-09 LAB — POCT I-STAT 3, ART BLOOD GAS (G3+)
ACID-BASE DEFICIT: 3 mmol/L — AB (ref 0.0–2.0)
ACID-BASE DEFICIT: 1 mmol/L (ref 0.0–2.0)
Acid-base deficit: 2 mmol/L (ref 0.0–2.0)
Acid-base deficit: 1 mmol/L (ref 0.0–2.0)
BICARBONATE: 24.3 meq/L — AB (ref 20.0–24.0)
Bicarbonate: 23.7 mEq/L (ref 20.0–24.0)
Bicarbonate: 24.9 mEq/L — ABNORMAL HIGH (ref 20.0–24.0)
Bicarbonate: 24.6 mEq/L — ABNORMAL HIGH (ref 20.0–24.0)
O2 SAT: 96 %
O2 SAT: 93 %
O2 Saturation: 98 %
O2 Saturation: 96 %
PCO2 ART: 42.7 mmHg (ref 35.0–45.0)
PCO2 ART: 42.9 mmHg (ref 35.0–45.0)
PCO2 ART: 44.3 mmHg (ref 35.0–45.0)
PH ART: 7.369 (ref 7.350–7.450)
PO2 ART: 82 mmHg (ref 80.0–100.0)
PO2 ART: 88 mmHg (ref 80.0–100.0)
Patient temperature: 36.1
Patient temperature: 36.9
TCO2: 26 mmol/L (ref 0–100)
TCO2: 25 mmol/L (ref 0–100)
TCO2: 26 mmol/L (ref 0–100)
TCO2: 26 mmol/L (ref 0–100)
pCO2 arterial: 47.2 mmHg — ABNORMAL HIGH (ref 35.0–45.0)
pH, Arterial: 7.358 (ref 7.350–7.450)
pH, Arterial: 7.307 — ABNORMAL LOW (ref 7.350–7.450)
pH, Arterial: 7.352 (ref 7.350–7.450)
pO2, Arterial: 72 mmHg — ABNORMAL LOW (ref 80.0–100.0)
pO2, Arterial: 106 mmHg — ABNORMAL HIGH (ref 80.0–100.0)

## 2015-07-09 LAB — CBC
HCT: 32 % — ABNORMAL LOW (ref 39.0–52.0)
HEMATOCRIT: 30.1 % — AB (ref 39.0–52.0)
HEMOGLOBIN: 11.4 g/dL — AB (ref 13.0–17.0)
HEMOGLOBIN: 10.4 g/dL — AB (ref 13.0–17.0)
MCH: 31.8 pg (ref 26.0–34.0)
MCH: 30.8 pg (ref 26.0–34.0)
MCHC: 35.6 g/dL (ref 30.0–36.0)
MCHC: 34.6 g/dL (ref 30.0–36.0)
MCV: 89.4 fL (ref 78.0–100.0)
MCV: 89.1 fL (ref 78.0–100.0)
PLATELETS: 128 10*3/uL — AB (ref 150–400)
Platelets: 127 10*3/uL — ABNORMAL LOW (ref 150–400)
RBC: 3.58 MIL/uL — AB (ref 4.22–5.81)
RBC: 3.38 MIL/uL — AB (ref 4.22–5.81)
RDW: 12.3 % (ref 11.5–15.5)
RDW: 12.3 % (ref 11.5–15.5)
WBC: 9.2 10*3/uL (ref 4.0–10.5)
WBC: 12.2 10*3/uL — ABNORMAL HIGH (ref 4.0–10.5)

## 2015-07-09 LAB — HEMOGLOBIN AND HEMATOCRIT, BLOOD
HEMATOCRIT: 26.1 % — AB (ref 39.0–52.0)
HEMOGLOBIN: 9.1 g/dL — AB (ref 13.0–17.0)

## 2015-07-09 LAB — POCT I-STAT, CHEM 8
BUN: 15 mg/dL (ref 6–20)
CALCIUM ION: 1.16 mmol/L (ref 1.12–1.23)
CHLORIDE: 105 mmol/L (ref 101–111)
Creatinine, Ser: 0.8 mg/dL (ref 0.61–1.24)
Glucose, Bld: 153 mg/dL — ABNORMAL HIGH (ref 65–99)
HEMATOCRIT: 31 % — AB (ref 39.0–52.0)
Hemoglobin: 10.5 g/dL — ABNORMAL LOW (ref 13.0–17.0)
Potassium: 4.2 mmol/L (ref 3.5–5.1)
SODIUM: 141 mmol/L (ref 135–145)
TCO2: 24 mmol/L (ref 0–100)

## 2015-07-09 LAB — MAGNESIUM: Magnesium: 2.5 mg/dL — ABNORMAL HIGH (ref 1.7–2.4)

## 2015-07-09 LAB — GLUCOSE, CAPILLARY
GLUCOSE-CAPILLARY: 117 mg/dL — AB (ref 65–99)
GLUCOSE-CAPILLARY: 175 mg/dL — AB (ref 65–99)
GLUCOSE-CAPILLARY: 160 mg/dL — AB (ref 65–99)
GLUCOSE-CAPILLARY: 137 mg/dL — AB (ref 65–99)
GLUCOSE-CAPILLARY: 134 mg/dL — AB (ref 65–99)
GLUCOSE-CAPILLARY: 129 mg/dL — AB (ref 65–99)
GLUCOSE-CAPILLARY: 156 mg/dL — AB (ref 65–99)
Glucose-Capillary: 155 mg/dL — ABNORMAL HIGH (ref 65–99)

## 2015-07-09 LAB — PROTIME-INR
INR: 1.5 — AB (ref 0.00–1.49)
PROTHROMBIN TIME: 18.2 s — AB (ref 11.6–15.2)

## 2015-07-09 LAB — POCT I-STAT 4, (NA,K, GLUC, HGB,HCT)
Glucose, Bld: 187 mg/dL — ABNORMAL HIGH (ref 65–99)
HCT: 32 % — ABNORMAL LOW (ref 39.0–52.0)
HEMOGLOBIN: 10.9 g/dL — AB (ref 13.0–17.0)
POTASSIUM: 4.2 mmol/L (ref 3.5–5.1)
SODIUM: 139 mmol/L (ref 135–145)

## 2015-07-09 LAB — PLATELET COUNT: Platelets: 135 10*3/uL — ABNORMAL LOW (ref 150–400)

## 2015-07-09 LAB — CREATININE, SERUM: Creatinine, Ser: 0.9 mg/dL (ref 0.61–1.24)

## 2015-07-09 LAB — APTT: APTT: 34 s (ref 24–37)

## 2015-07-09 SURGERY — REPLACEMENT, AORTIC VALVE, OPEN
Anesthesia: General | Site: Chest

## 2015-07-09 MED ORDER — ROCURONIUM BROMIDE 100 MG/10ML IV SOLN
INTRAVENOUS | Status: DC | PRN
  Administered 2015-07-09: 50 mg via INTRAVENOUS

## 2015-07-09 MED ORDER — MORPHINE SULFATE (PF) 2 MG/ML IV SOLN: 1.0000 mg | INTRAVENOUS | Status: AC | PRN

## 2015-07-09 MED ORDER — BISACODYL 10 MG RE SUPP: 10.0000 mg | Freq: Every day | RECTAL | Status: DC

## 2015-07-09 MED ORDER — RACEPINEPHRINE HCL 2.25 % IN NEBU
0.5000 mL | INHALATION_SOLUTION | Freq: Once | RESPIRATORY_TRACT | Status: AC
  Administered 2015-07-09: 0.5 mL via RESPIRATORY_TRACT

## 2015-07-09 MED ORDER — DOPAMINE-DEXTROSE 3.2-5 MG/ML-% IV SOLN
3.0000 ug/kg/min | INTRAVENOUS | Status: DC
  Administered 2015-07-09: 3 ug/kg/min via INTRAVENOUS

## 2015-07-09 MED ORDER — PROPOFOL 10 MG/ML IV BOLUS
INTRAVENOUS | Status: DC | PRN
  Administered 2015-07-09: 150 mg via INTRAVENOUS

## 2015-07-09 MED ORDER — LACTATED RINGERS IV SOLN
INTRAVENOUS | Status: DC | PRN
  Administered 2015-07-09 (×2): via INTRAVENOUS

## 2015-07-09 MED ORDER — RACEPINEPHRINE HCL 2.25 % IN NEBU
INHALATION_SOLUTION | RESPIRATORY_TRACT | Status: AC
  Filled 2015-07-09: qty 0.5

## 2015-07-09 MED ORDER — ALBUMIN HUMAN 5 % IV SOLN
INTRAVENOUS | Status: DC | PRN
  Administered 2015-07-09: 14:00:00 via INTRAVENOUS

## 2015-07-09 MED ORDER — DEXMEDETOMIDINE HCL IN NACL 200 MCG/50ML IV SOLN
0.0000 ug/kg/h | INTRAVENOUS | Status: DC
  Administered 2015-07-09 (×2): 0.7 ug/kg/h via INTRAVENOUS
  Administered 2015-07-09: 0.3 ug/kg/h via INTRAVENOUS
  Filled 2015-07-09: qty 50

## 2015-07-09 MED ORDER — ACETAMINOPHEN 500 MG PO TABS
1000.0000 mg | ORAL_TABLET | Freq: Four times a day (QID) | ORAL | Status: DC
  Administered 2015-07-10 – 2015-07-14 (×13): 1000 mg via ORAL
  Filled 2015-07-09 (×14): qty 2

## 2015-07-09 MED ORDER — ARTIFICIAL TEARS OP OINT
TOPICAL_OINTMENT | OPHTHALMIC | Status: DC | PRN
  Administered 2015-07-09: 1 via OPHTHALMIC

## 2015-07-09 MED ORDER — SODIUM CHLORIDE 0.9 % IV SOLN
INTRAVENOUS | Status: DC
  Administered 2015-07-09: 2.3 [IU]/h via INTRAVENOUS
  Administered 2015-07-09: 1.3 [IU]/h via INTRAVENOUS
  Administered 2015-07-10: 3.6 [IU]/h via INTRAVENOUS
  Filled 2015-07-09 (×3): qty 2.5

## 2015-07-09 MED ORDER — ALBUMIN HUMAN 5 % IV SOLN
250.0000 mL | INTRAVENOUS | Status: AC | PRN
Start: 2015-07-09 — End: 2015-07-10
  Administered 2015-07-09 (×3): 250 mL via INTRAVENOUS
  Filled 2015-07-09: qty 250

## 2015-07-09 MED ORDER — ACETAMINOPHEN 650 MG RE SUPP
650.0000 mg | Freq: Once | RECTAL | Status: AC
  Administered 2015-07-09: 650 mg via RECTAL

## 2015-07-09 MED ORDER — LACTATED RINGERS IV SOLN
INTRAVENOUS | Status: DC
Start: 2015-07-09 — End: 2015-07-11
  Administered 2015-07-09: 16:00:00 via INTRAVENOUS

## 2015-07-09 MED ORDER — 0.9 % SODIUM CHLORIDE (POUR BTL) OPTIME
TOPICAL | Status: DC | PRN
  Administered 2015-07-09: 6000 mL

## 2015-07-09 MED ORDER — METOPROLOL TARTRATE 5 MG/5ML IV SOLN: 2.5000 mg | INTRAVENOUS | Status: DC | PRN

## 2015-07-09 MED ORDER — ACETAMINOPHEN 160 MG/5ML PO SOLN: 1000.0000 mg | Freq: Four times a day (QID) | ORAL | Status: DC

## 2015-07-09 MED ORDER — NITROGLYCERIN IN D5W 200-5 MCG/ML-% IV SOLN
0.0000 ug/min | INTRAVENOUS | Status: DC
  Administered 2015-07-09: 10 ug/min via INTRAVENOUS

## 2015-07-09 MED ORDER — BISACODYL 5 MG PO TBEC
10.0000 mg | DELAYED_RELEASE_TABLET | Freq: Every day | ORAL | Status: DC
  Administered 2015-07-10 – 2015-07-12 (×2): 10 mg via ORAL
  Filled 2015-07-09 (×2): qty 2

## 2015-07-09 MED ORDER — VECURONIUM BROMIDE 10 MG IV SOLR
INTRAVENOUS | Status: DC | PRN
  Administered 2015-07-09 (×3): 5 mg via INTRAVENOUS
  Administered 2015-07-09: 3 mg via INTRAVENOUS
  Administered 2015-07-09: 5 mg via INTRAVENOUS

## 2015-07-09 MED ORDER — MAGNESIUM SULFATE 4 GM/100ML IV SOLN
4.0000 g | Freq: Once | INTRAVENOUS | Status: AC
  Administered 2015-07-09: 4 g via INTRAVENOUS
  Filled 2015-07-09: qty 100

## 2015-07-09 MED ORDER — FENTANYL CITRATE (PF) 250 MCG/5ML IJ SOLN
INTRAMUSCULAR | Status: AC
Start: 2015-07-09 — End: 2015-07-09
  Filled 2015-07-09: qty 25

## 2015-07-09 MED ORDER — SODIUM CHLORIDE 0.9% FLUSH
3.0000 mL | Freq: Two times a day (BID) | INTRAVENOUS | Status: DC
  Administered 2015-07-10 – 2015-07-13 (×5): 3 mL via INTRAVENOUS

## 2015-07-09 MED ORDER — ANTISEPTIC ORAL RINSE SOLUTION (CORINZ)
7.0000 mL | Freq: Four times a day (QID) | OROMUCOSAL | Status: DC
  Administered 2015-07-10 (×2): 7 mL via OROMUCOSAL

## 2015-07-09 MED ORDER — DEXTROSE 5 % IV SOLN
1.5000 g | Freq: Two times a day (BID) | INTRAVENOUS | Status: AC
  Administered 2015-07-10 – 2015-07-11 (×4): 1.5 g via INTRAVENOUS
  Filled 2015-07-09 (×4): qty 1.5

## 2015-07-09 MED ORDER — MIDAZOLAM HCL 5 MG/5ML IJ SOLN
INTRAMUSCULAR | Status: DC | PRN
  Administered 2015-07-09 (×2): 2 mg via INTRAVENOUS
  Administered 2015-07-09: 3 mg via INTRAVENOUS
  Administered 2015-07-09 (×3): 1 mg via INTRAVENOUS

## 2015-07-09 MED ORDER — HEPARIN SODIUM (PORCINE) 1000 UNIT/ML IJ SOLN
INTRAMUSCULAR | Status: DC | PRN
  Administered 2015-07-09: 2000 [IU] via INTRAVENOUS
  Administered 2015-07-09: 43000 [IU] via INTRAVENOUS

## 2015-07-09 MED ORDER — CHLORHEXIDINE GLUCONATE 0.12% ORAL RINSE (MEDLINE KIT)
15.0000 mL | Freq: Two times a day (BID) | OROMUCOSAL | Status: DC
  Administered 2015-07-09: 15 mL via OROMUCOSAL

## 2015-07-09 MED ORDER — VANCOMYCIN HCL IN DEXTROSE 1-5 GM/200ML-% IV SOLN
1000.0000 mg | Freq: Once | INTRAVENOUS | Status: AC
  Administered 2015-07-09: 1000 mg via INTRAVENOUS
  Filled 2015-07-09: qty 200

## 2015-07-09 MED ORDER — OXYCODONE HCL 5 MG PO TABS: 5.0000 mg | ORAL_TABLET | ORAL | Status: DC | PRN

## 2015-07-09 MED ORDER — MORPHINE SULFATE (PF) 2 MG/ML IV SOLN
2.0000 mg | INTRAVENOUS | Status: DC | PRN
  Administered 2015-07-09: 4 mg via INTRAVENOUS
  Filled 2015-07-09: qty 2

## 2015-07-09 MED ORDER — LACTATED RINGERS IV SOLN: 500.0000 mL | Freq: Once | INTRAVENOUS | Status: DC | PRN

## 2015-07-09 MED ORDER — TRAMADOL HCL 50 MG PO TABS
50.0000 mg | ORAL_TABLET | ORAL | Status: DC | PRN
  Administered 2015-07-10 (×2): 100 mg via ORAL
  Administered 2015-07-11 – 2015-07-12 (×2): 50 mg via ORAL
  Filled 2015-07-09: qty 2
  Filled 2015-07-09: qty 1
  Filled 2015-07-09: qty 2
  Filled 2015-07-09: qty 1

## 2015-07-09 MED ORDER — DOCUSATE SODIUM 100 MG PO CAPS
200.0000 mg | ORAL_CAPSULE | Freq: Every day | ORAL | Status: DC
  Administered 2015-07-10 – 2015-07-12 (×2): 200 mg via ORAL
  Filled 2015-07-09 (×3): qty 2

## 2015-07-09 MED ORDER — CALCIUM CHLORIDE 10 % IV SOLN
INTRAVENOUS | Status: DC | PRN
  Administered 2015-07-09 (×2): 200 mg via INTRAVENOUS

## 2015-07-09 MED ORDER — FAMOTIDINE IN NACL 20-0.9 MG/50ML-% IV SOLN
20.0000 mg | Freq: Two times a day (BID) | INTRAVENOUS | Status: DC
  Administered 2015-07-09: 20 mg via INTRAVENOUS

## 2015-07-09 MED ORDER — ACETAMINOPHEN 160 MG/5ML PO SOLN: 650.0000 mg | Freq: Once | ORAL | Status: AC

## 2015-07-09 MED ORDER — INSULIN REGULAR BOLUS VIA INFUSION
0.0000 [IU] | Freq: Three times a day (TID) | INTRAVENOUS | Status: DC
  Administered 2015-07-10: 2 [IU] via INTRAVENOUS
  Filled 2015-07-09: qty 10

## 2015-07-09 MED ORDER — SODIUM CHLORIDE 0.45 % IV SOLN
INTRAVENOUS | Status: DC | PRN
  Administered 2015-07-09: 16:00:00 via INTRAVENOUS

## 2015-07-09 MED ORDER — FENTANYL CITRATE (PF) 250 MCG/5ML IJ SOLN
INTRAMUSCULAR | Status: AC
  Filled 2015-07-09: qty 10

## 2015-07-09 MED ORDER — CHLORHEXIDINE GLUCONATE 0.12 % MT SOLN
15.0000 mL | OROMUCOSAL | Status: AC
  Administered 2015-07-09: 15 mL via OROMUCOSAL

## 2015-07-09 MED ORDER — LEVOTHYROXINE SODIUM 25 MCG PO TABS
137.0000 ug | ORAL_TABLET | Freq: Every day | ORAL | Status: DC
  Administered 2015-07-10 – 2015-07-14 (×5): 137 ug via ORAL
  Filled 2015-07-09 (×5): qty 1

## 2015-07-09 MED ORDER — ASPIRIN EC 325 MG PO TBEC: 325.0000 mg | DELAYED_RELEASE_TABLET | Freq: Every day | ORAL | Status: DC

## 2015-07-09 MED ORDER — METOPROLOL TARTRATE 12.5 MG HALF TABLET
12.5000 mg | ORAL_TABLET | Freq: Two times a day (BID) | ORAL | Status: DC
  Administered 2015-07-10 – 2015-07-12 (×3): 12.5 mg via ORAL
  Filled 2015-07-09 (×4): qty 1

## 2015-07-09 MED ORDER — HEMOSTATIC AGENTS (NO CHARGE) OPTIME
TOPICAL | Status: DC | PRN
  Administered 2015-07-09: 3 via TOPICAL

## 2015-07-09 MED ORDER — SUCCINYLCHOLINE CHLORIDE 20 MG/ML IJ SOLN
INTRAMUSCULAR | Status: DC | PRN
  Administered 2015-07-09: 120 mg via INTRAVENOUS

## 2015-07-09 MED ORDER — SODIUM CHLORIDE 0.9 % IV SOLN: 250.0000 mL | INTRAVENOUS | Status: DC

## 2015-07-09 MED ORDER — DEXTROSE 5 % IV SOLN
0.0000 ug/min | INTRAVENOUS | Status: DC
  Filled 2015-07-09: qty 2

## 2015-07-09 MED ORDER — MIDAZOLAM HCL 2 MG/2ML IJ SOLN: 2.0000 mg | INTRAMUSCULAR | Status: DC | PRN

## 2015-07-09 MED ORDER — LACTATED RINGERS IV SOLN
INTRAVENOUS | Status: DC | PRN
  Administered 2015-07-09 (×2): via INTRAVENOUS

## 2015-07-09 MED ORDER — PROPOFOL 10 MG/ML IV BOLUS
INTRAVENOUS | Status: AC
Start: 2015-07-09 — End: 2015-07-09
  Filled 2015-07-09: qty 20

## 2015-07-09 MED ORDER — MIDAZOLAM HCL 10 MG/2ML IJ SOLN
INTRAMUSCULAR | Status: AC
  Filled 2015-07-09: qty 2

## 2015-07-09 MED ORDER — SODIUM CHLORIDE 0.9% FLUSH: 3.0000 mL | INTRAVENOUS | Status: DC | PRN

## 2015-07-09 MED ORDER — PROTAMINE SULFATE 10 MG/ML IV SOLN
INTRAVENOUS | Status: AC
  Filled 2015-07-09: qty 50

## 2015-07-09 MED ORDER — SODIUM CHLORIDE 0.9 % IV SOLN
INTRAVENOUS | Status: DC
  Administered 2015-07-09: 16:00:00 via INTRAVENOUS

## 2015-07-09 MED ORDER — PROTAMINE SULFATE 10 MG/ML IV SOLN
INTRAVENOUS | Status: DC | PRN
  Administered 2015-07-09: 10 mg via INTRAVENOUS
  Administered 2015-07-09: 290 mg via INTRAVENOUS

## 2015-07-09 MED ORDER — HEMOSTATIC AGENTS (NO CHARGE) OPTIME
TOPICAL | Status: DC | PRN
  Administered 2015-07-09: 1 via TOPICAL

## 2015-07-09 MED ORDER — DEXMEDETOMIDINE HCL IN NACL 200 MCG/50ML IV SOLN
INTRAVENOUS | Status: DC | PRN
Start: 2015-07-09 — End: 2015-07-09
  Administered 2015-07-09: .3 ug/kg/h via INTRAVENOUS

## 2015-07-09 MED ORDER — ASPIRIN 81 MG PO CHEW: 324.0000 mg | CHEWABLE_TABLET | Freq: Every day | ORAL | Status: DC

## 2015-07-09 MED ORDER — LACTATED RINGERS IV SOLN: INTRAVENOUS | Status: DC

## 2015-07-09 MED ORDER — PANTOPRAZOLE SODIUM 40 MG PO TBEC
40.0000 mg | DELAYED_RELEASE_TABLET | Freq: Every day | ORAL | Status: DC
Start: 2015-07-11 — End: 2015-07-14
  Administered 2015-07-12 – 2015-07-14 (×3): 40 mg via ORAL
  Filled 2015-07-09 (×3): qty 1

## 2015-07-09 MED ORDER — METOPROLOL TARTRATE 25 MG/10 ML ORAL SUSPENSION
12.5000 mg | Freq: Two times a day (BID) | ORAL | Status: DC
  Filled 2015-07-09: qty 10

## 2015-07-09 MED ORDER — POTASSIUM CHLORIDE 10 MEQ/50ML IV SOLN: 10.0000 meq | INTRAVENOUS | Status: AC

## 2015-07-09 MED ORDER — FENTANYL CITRATE (PF) 100 MCG/2ML IJ SOLN
INTRAMUSCULAR | Status: DC | PRN
  Administered 2015-07-09 (×3): 150 ug via INTRAVENOUS
  Administered 2015-07-09: 100 ug via INTRAVENOUS
  Administered 2015-07-09: 250 ug via INTRAVENOUS
  Administered 2015-07-09: 100 ug via INTRAVENOUS
  Administered 2015-07-09: 50 ug via INTRAVENOUS
  Administered 2015-07-09: 100 ug via INTRAVENOUS
  Administered 2015-07-09: 250 ug via INTRAVENOUS
  Administered 2015-07-09: 50 ug via INTRAVENOUS
  Administered 2015-07-09: 150 ug via INTRAVENOUS
  Administered 2015-07-09: 250 ug via INTRAVENOUS

## 2015-07-09 MED ORDER — ONDANSETRON HCL 4 MG/2ML IJ SOLN
4.0000 mg | Freq: Four times a day (QID) | INTRAMUSCULAR | Status: DC | PRN
  Administered 2015-07-09 – 2015-07-11 (×4): 4 mg via INTRAVENOUS
  Filled 2015-07-09 (×4): qty 2

## 2015-07-09 MED ORDER — LACTATED RINGERS IV SOLN
INTRAVENOUS | Status: DC | PRN
  Administered 2015-07-09 (×2): via INTRAVENOUS

## 2015-07-09 MED FILL — Mannitol IV Soln 20%: INTRAVENOUS | Qty: 500 | Status: AC

## 2015-07-09 MED FILL — Sodium Bicarbonate IV Soln 8.4%: INTRAVENOUS | Qty: 50 | Status: AC

## 2015-07-09 MED FILL — Potassium Chloride Inj 2 mEq/ML: INTRAVENOUS | Qty: 40 | Status: AC

## 2015-07-09 MED FILL — Heparin Sodium (Porcine) Inj 1000 Unit/ML: INTRAMUSCULAR | Qty: 10 | Status: AC

## 2015-07-09 MED FILL — Lidocaine HCl IV Inj 20 MG/ML: INTRAVENOUS | Qty: 10 | Status: AC

## 2015-07-09 MED FILL — Sodium Chloride IV Soln 0.9%: INTRAVENOUS | Qty: 2000 | Status: AC

## 2015-07-09 MED FILL — Albumin, Human Inj 5%: INTRAVENOUS | Qty: 250 | Status: AC

## 2015-07-09 MED FILL — Heparin Sodium (Porcine) Inj 1000 Unit/ML: INTRAMUSCULAR | Qty: 30 | Status: AC

## 2015-07-09 MED FILL — Magnesium Sulfate Inj 50%: INTRAMUSCULAR | Qty: 10 | Status: AC

## 2015-07-09 MED FILL — Electrolyte-R (PH 7.4) Solution: INTRAVENOUS | Qty: 5000 | Status: AC

## 2015-07-09 SURGICAL SUPPLY — 113 items
ADAPTER CARDIO PERF ANTE/RETRO (ADAPTER) ×2 IMPLANT
APPLICATOR COTTON TIP 6IN STRL (MISCELLANEOUS) IMPLANT
BAG DECANTER FOR FLEXI CONT (MISCELLANEOUS) ×2 IMPLANT
BANDAGE ELASTIC 4 VELCRO ST LF (GAUZE/BANDAGES/DRESSINGS) ×2 IMPLANT
BANDAGE ELASTIC 6 VELCRO ST LF (GAUZE/BANDAGES/DRESSINGS) ×2 IMPLANT
BASKET HEART (ORDER IN 25'S) (MISCELLANEOUS) ×1
BASKET HEART (ORDER IN 25S) (MISCELLANEOUS) ×1 IMPLANT
BLADE STERNUM SYSTEM 6 (BLADE) ×2 IMPLANT
BLADE SURG 15 STRL LF DISP TIS (BLADE) ×1 IMPLANT
BLADE SURG 15 STRL SS (BLADE) ×1
BNDG GAUZE ELAST 4 BULKY (GAUZE/BANDAGES/DRESSINGS) ×2 IMPLANT
CANISTER SUCTION 2500CC (MISCELLANEOUS) ×2 IMPLANT
CANNULA EZ GLIDE AORTIC 21FR (CANNULA) ×2 IMPLANT
CANNULA GUNDRY RCSP 15FR (MISCELLANEOUS) ×2 IMPLANT
CATH CPB KIT HENDRICKSON (MISCELLANEOUS) ×2 IMPLANT
CATH HEART VENT LEFT (CATHETERS) ×1 IMPLANT
CATH ROBINSON RED A/P 18FR (CATHETERS) ×4 IMPLANT
CATH THORACIC 36FR (CATHETERS) ×2 IMPLANT
CATH THORACIC 36FR RT ANG (CATHETERS) ×4 IMPLANT
CLIP FOGARTY SPRING 6M (CLIP) ×2 IMPLANT
CLIP TI MEDIUM 24 (CLIP) IMPLANT
CLIP TI WIDE RED SMALL 24 (CLIP) ×2 IMPLANT
CONT SPEC 4OZ CLIKSEAL STRL BL (MISCELLANEOUS) ×2 IMPLANT
CRADLE DONUT ADULT HEAD (MISCELLANEOUS) ×2 IMPLANT
DRAPE CARDIOVASCULAR INCISE (DRAPES) ×1
DRAPE SLUSH/WARMER DISC (DRAPES) ×2 IMPLANT
DRAPE SRG 135X102X78XABS (DRAPES) ×1 IMPLANT
DRSG COVADERM 4X14 (GAUZE/BANDAGES/DRESSINGS) ×2 IMPLANT
DRSG TEGADERM 4X4.75 (GAUZE/BANDAGES/DRESSINGS) ×6 IMPLANT
ELECT REM PT RETURN 9FT ADLT (ELECTROSURGICAL) ×4
ELECTRODE REM PT RTRN 9FT ADLT (ELECTROSURGICAL) ×2 IMPLANT
FELT TEFLON 1X6 (MISCELLANEOUS) ×2 IMPLANT
GAUZE SPONGE 4X4 12PLY STRL (GAUZE/BANDAGES/DRESSINGS) ×4 IMPLANT
GLOVE BIO SURGEON STRL SZ 6.5 (GLOVE) ×8 IMPLANT
GLOVE BIO SURGEON STRL SZ7.5 (GLOVE) ×6 IMPLANT
GLOVE BIOGEL PI IND STRL 6 (GLOVE) ×2 IMPLANT
GLOVE BIOGEL PI IND STRL 6.5 (GLOVE) ×2 IMPLANT
GLOVE BIOGEL PI INDICATOR 6 (GLOVE) ×2
GLOVE BIOGEL PI INDICATOR 6.5 (GLOVE) ×2
GLOVE SURG SIGNA 7.5 PF LTX (GLOVE) ×6 IMPLANT
GOWN STRL REUS W/ TWL LRG LVL3 (GOWN DISPOSABLE) ×4 IMPLANT
GOWN STRL REUS W/ TWL XL LVL3 (GOWN DISPOSABLE) ×2 IMPLANT
GOWN STRL REUS W/TWL LRG LVL3 (GOWN DISPOSABLE) ×4
GOWN STRL REUS W/TWL XL LVL3 (GOWN DISPOSABLE) ×2
HEMOSTAT POWDER SURGIFOAM 1G (HEMOSTASIS) ×6 IMPLANT
HEMOSTAT SURGICEL 2X14 (HEMOSTASIS) ×2 IMPLANT
INSERT FOGARTY XLG (MISCELLANEOUS) IMPLANT
KIT BASIN OR (CUSTOM PROCEDURE TRAY) ×2 IMPLANT
KIT ROOM TURNOVER OR (KITS) ×2 IMPLANT
KIT SUCTION CATH 14FR (SUCTIONS) ×4 IMPLANT
KIT VASOVIEW 6 PRO VH 2400 (KITS) ×2 IMPLANT
LEAD PACING MYOCARDI (MISCELLANEOUS) ×2 IMPLANT
LINE VENT (MISCELLANEOUS) ×2 IMPLANT
MARKER GRAFT CORONARY BYPASS (MISCELLANEOUS) ×6 IMPLANT
NEEDLE AORTIC AIR ASPIRATING (NEEDLE) ×2 IMPLANT
NS IRRIG 1000ML POUR BTL (IV SOLUTION) ×14 IMPLANT
PACK OPEN HEART (CUSTOM PROCEDURE TRAY) ×2 IMPLANT
PAD ARMBOARD 7.5X6 YLW CONV (MISCELLANEOUS) ×4 IMPLANT
PAD ELECT DEFIB RADIOL ZOLL (MISCELLANEOUS) ×2 IMPLANT
PENCIL BUTTON HOLSTER BLD 10FT (ELECTRODE) ×2 IMPLANT
PUNCH AORTIC ROTATE 4.0MM (MISCELLANEOUS) IMPLANT
PUNCH AORTIC ROTATE 4.5MM 8IN (MISCELLANEOUS) ×2 IMPLANT
PUNCH AORTIC ROTATE 5MM 8IN (MISCELLANEOUS) IMPLANT
SET CARDIOPLEGIA MPS 5001102 (MISCELLANEOUS) ×2 IMPLANT
SPONGE GAUZE 4X4 12PLY STER LF (GAUZE/BANDAGES/DRESSINGS) ×4 IMPLANT
SPONGE LAP 18X18 X RAY DECT (DISPOSABLE) ×2 IMPLANT
SUT BONE WAX W31G (SUTURE) ×2 IMPLANT
SUT ETHIBON 2 0 V 52N 30 (SUTURE) ×4 IMPLANT
SUT ETHIBON EXCEL 2-0 V-5 (SUTURE) IMPLANT
SUT ETHIBOND 2 0 SH (SUTURE) ×1
SUT ETHIBOND 2 0 SH 36X2 (SUTURE) ×1 IMPLANT
SUT ETHIBOND 2 0 V4 (SUTURE) IMPLANT
SUT ETHIBOND 2 0V4 GREEN (SUTURE) IMPLANT
SUT ETHIBOND 4 0 RB 1 (SUTURE) IMPLANT
SUT ETHIBOND NAB MH 2-0 36IN (SUTURE) ×2 IMPLANT
SUT ETHIBOND V-5 VALVE (SUTURE) IMPLANT
SUT MNCRL AB 4-0 PS2 18 (SUTURE) IMPLANT
SUT PROLENE 3 0 SH 1 (SUTURE) IMPLANT
SUT PROLENE 3 0 SH DA (SUTURE) ×2 IMPLANT
SUT PROLENE 4 0 RB 1 (SUTURE) ×5
SUT PROLENE 4 0 SH DA (SUTURE) IMPLANT
SUT PROLENE 4-0 RB1 .5 CRCL 36 (SUTURE) ×5 IMPLANT
SUT PROLENE 5 0 C 1 36 (SUTURE) ×2 IMPLANT
SUT PROLENE 6 0 C 1 30 (SUTURE) ×4 IMPLANT
SUT PROLENE 7 0 BV1 MDA (SUTURE) ×2 IMPLANT
SUT PROLENE 8 0 BV175 6 (SUTURE) ×6 IMPLANT
SUT SILK  1 MH (SUTURE)
SUT SILK 1 MH (SUTURE) IMPLANT
SUT STEEL 6MS V (SUTURE) ×2 IMPLANT
SUT STEEL STERNAL CCS#1 18IN (SUTURE) IMPLANT
SUT STEEL SZ 6 DBL 3X14 BALL (SUTURE) ×2 IMPLANT
SUT VIC AB 1 CTX 36 (SUTURE) ×2
SUT VIC AB 1 CTX36XBRD ANBCTR (SUTURE) ×2 IMPLANT
SUT VIC AB 2-0 CT1 27 (SUTURE) ×2
SUT VIC AB 2-0 CT1 TAPERPNT 27 (SUTURE) ×2 IMPLANT
SUT VIC AB 2-0 CTX 27 (SUTURE) IMPLANT
SUT VIC AB 3-0 SH 27 (SUTURE)
SUT VIC AB 3-0 SH 27X BRD (SUTURE) IMPLANT
SUT VIC AB 3-0 X1 27 (SUTURE) ×4 IMPLANT
SUT VICRYL 4-0 PS2 18IN ABS (SUTURE) IMPLANT
SUTURE E-PAK OPEN HEART (SUTURE) ×2 IMPLANT
SYR 10ML KIT SKIN ADHESIVE (MISCELLANEOUS) IMPLANT
SYSTEM SAHARA CHEST DRAIN ATS (WOUND CARE) ×2 IMPLANT
TAPE CLOTH SURG 4X10 WHT LF (GAUZE/BANDAGES/DRESSINGS) ×4 IMPLANT
TOWEL OR 17X24 6PK STRL BLUE (TOWEL DISPOSABLE) ×4 IMPLANT
TOWEL OR 17X26 10 PK STRL BLUE (TOWEL DISPOSABLE) ×4 IMPLANT
TRAY FOLEY IC TEMP SENS 16FR (CATHETERS) ×2 IMPLANT
TUBE FEEDING 8FR 16IN STR KANG (MISCELLANEOUS) ×2 IMPLANT
TUBING INSUFFLATION (TUBING) ×2 IMPLANT
UNDERPAD 30X30 INCONTINENT (UNDERPADS AND DIAPERS) ×2 IMPLANT
VALVE MAGNA EASE AORTIC 23MM (Prosthesis & Implant Heart) ×2 IMPLANT
VENT LEFT HEART 12002 (CATHETERS) ×2
WATER STERILE IRR 1000ML POUR (IV SOLUTION) ×4 IMPLANT

## 2015-07-09 NOTE — Progress Notes (Signed)
RT NOTE:  Post- extubation patient continues to complain of something in the back of throat, No Stridor, pt is coughing up thick, clear, pink tinged secretions. Pt is still very drowsy but did note that he does wear CPAP at night for apnea. Wife arrived at that time and brought CPAP mask. RT asked RN to pull ABG to verify. RT placed patient on CPAP 12cm. RT quickly noted patient having apneic episodes every 2 minutes and Sats dropping to 88% even on 6L O2. RT asked RN to contact MD for BIPAP order to allow patient time to fully wake from medications and later tonight we will Attempt to transition him over to home CPAP. RN contacted Dr. Laneta SimmersBartle and he ok'd BIPAP.   @ 2212 patient states that he still feels something in throat. RT will administer Racemic Epi @ this time in effort to reduce possible airway swelling. RT will monitor.

## 2015-07-09 NOTE — Brief Op Note (Addendum)
07/09/2015  1:36 PM  PATIENT:  [name]  65 y.o. male      [address]       [place] [id number]             [phone]     07/09/2015  1:37 PM  PATIENT:  Nathaniel Pepperichard L Lessig  65 y.o. male  PRE-OPERATIVE DIAGNOSIS:  3 VESSEL CORONARY ARTERY DISEASE, AORTIC STENOSIS  POST-OPERATIVE DIAGNOSIS:  3 VESSEL CORONARY ARTERY DISEASE, AORTIC STENOSIS  PROCEDURE:  Procedure(s): AORTIC VALVE REPLACEMENT (AVR) #23 MAGNA-EASE PERICARDIAL VALVE (Serial # [id number]) CORONARY ARTERY BYPASS GRAFTING (CABG) x 3  LEFT INTERNAL MAMMARY  To LAD  SVG to OM2  SVG to PDA ENDOSCOPIC VEIN HARVEST LEFT THIGH(EVH)  INTRAOPERATIVE TRANSESOPHAGEAL ECHOCARDIOGRAM  SURGEON:  Surgeon(s): Loreli SlotSteven C Jonae Renshaw, MD  PHYSICIAN ASSISTANT: WAYNE GOLD PA-C  ANESTHESIA:   general  PATIENT CONDITION:  ICU - intubated and hemodynamically stable.  PRE-OPERATIVE WEIGHT: 99kg  COMPLICATIONS:: NO KNOWN  XC= 135 min CPB= 186 min  Vein fair quality, no additional vein available. LIMA good quality. Good targets. Tricuspid calcific, stenotic aortic valve, moderate anular calcification.

## 2015-07-09 NOTE — Op Note (Signed)
[name], [name] NO.:  [id number]  MEDICAL RECORD NO.:  [id number]  LOCATION:  2S14C                        FACILITY:  [facility]  PHYSICIAN:  Salvatore Decent. Dorris Fetch, M.D.DATE OF BIRTH:  [birth year]  DATE OF PROCEDURE:  07/09/2015 DATE OF DISCHARGE:                              OPERATIVE REPORT   PREOPERATIVE DIAGNOSIS:  Three-vessel coronary disease with moderately severe aortic stenosis.  POSTOPERATIVE DIAGNOSIS:  Three-vessel coronary disease with moderately severe aortic stenosis.  PROCEDURE:  Median sternotomy, extracorporeal circulation,  Coronary artery bypass grafting x 3   Left internal mammary artery to LAD,   Saphenous vein graft to obtuse marginal 2,   Saphenous vein graft to posterior descending  Aortic valve replacement with 23 mm [facility] Ease bovine pericardial   valve (model #3300 TFX, serial [id number]),  Endoscopic vein harvest left thigh.  SURGEON:  Salvatore Decent. Dorris Fetch, M.D.  ASSISTANT:  Rowe Clack, P.A.-C.  ANESTHESIA:  General.  FINDINGS:  Transesophageal echocardiography showed moderately severe to severe aortic stenosis.  Left ventricular hypertrophy.  No other valvular pathology.  Saphenous vein fair quality, mammary good quality. Good quality targets.  Aortic valve tricuspid, calcific, stenotic with moderate annular calcification.  Postbypass transesophageal echocardiography showed good function of the prosthetic valve with no perivalvular leaks.  CLINICAL NOTE:  [name] is a 65 year old gentleman with a multitude of medical problems including atherosclerotic cardiovascular disease and aortic stenosis.  He recently has been having presyncopal spells and shortness of breath with exertion.  He had a known moderately severe aortic stenosis with a valve area calculated at 0.9 cm2 with a peak gradient of 52 mmHg by echo in January.  He underwent cardiac catheterization which showed severe three-vessel disease and a  calculated value area of 1.1 squared cm with a mean gradient of 33 mmHg.  He was advised to undergo coronary artery bypass grafting and aortic valve replacement. The indications, risks, benefits, and alternatives were discussed in detail with the patient.  He understood and accepted the risks and agreed to proceed.  We did discuss the use of a tissue valve versus a mechanical valve and the relative advantages and disadvantages of each, he did not wish to be on lifelong Coumadin and wanted to have a tissue valve placed.  He understood and accepted the risks and agreed to proceed.  OPERATIVE NOTE:  [name] was brought to the preoperative holding area on July 09, 2015.  Anesthesia placed a Swan-Ganz catheter and an arterial blood pressure monitoring line.  He was taken to the operating room, anesthetized, and intubated.  A Foley catheter was placed.  Intravenous antibiotics were administered.  The chest, abdomen, and legs were prepped and draped in usual sterile fashion.  Transesophageal echocardiography was performed by Dr. Hart Rochester of the Anesthesia Service. Findings as previously noted.    An incision made in the medial aspect of the left leg below the knee.  The vein was identified, but there were 2 veins running parallel to each other both which appeared sclerotic, and not suitable for use as a bypass graft.  An incision was made in the medial aspect of the right leg just below the knee, and the vein  was identified there. This vein also was small, but did not appear sclerotic.  Decision was made to attempt to harvest the right leg vein endoscopically.  The endoscope was advanced as far as just above the knee, but it was clear that this vein remained small and would have been difficult to harvest.  No additional dissection was performed on the right leg.  Another incision was made in the medial aspect of the left leg above the knee.  Initially, a small vein was identified, this was  very sclerotic and appeared to be continuation of the vein from below the knee.  With further exploration, a second superficial vein was found and this although was relatively small, it appeared to be of better quality and it was harvested endoscopically from the knee to the groin.  Simultaneously with the vein harvest, a median sternotomy was performed and the left internal mammary artery was harvested using standard technique.  The mammary artery was a good quality vessel.  The vein turned out to be fair quality.  2000 units of heparin was administered during the vessel harvest.  The remainder of full heparin dose was given prior to opening the pericardium.  After the vein had been harvested and was felt to be adequate, but not ideal, for bypass grafting, and the mammary was prepared, a sternal retractor was placed.  The pericardium was opened. After confirming adequate anticoagulation with ACT measurement, the aorta was cannulated via concentric 2-0 Ethibond pledgeted pursestring sutures.  A dual-stage venous cannula was placed via a pursestring suture in the right atrial appendage.  Cardiopulmonary bypass was initiated.  Flows were maintained per protocol.  The patient was cooled to 28 degrees Celsius.  The coronary arteries were inspected and anastomotic sites were chosen.  The conduits were inspected and cut to length.  A foam pad was placed in the pericardium to insulate the heart.  A temperature probe was placed in the myocardial septum.  A left ventricular vent was placed via a pursestring suture in the right superior pulmonary vein.  A retrograde cardioplegia cannula was placed via a pursestring suture in the right atrium and directed into the coronary sinus, and an antegrade cardioplegia cannula was placed in the ascending aorta.  The aorta was crossclamped.  The left ventricle was emptied via the aortic root vent.  Cardiac arrest then was achieved with combination of cold  antegrade and retrograde blood cardioplegia and topical iced saline.  There was a diastolic arrest with 750 mL of antegrade cardioplegia. There was relatively high pressure with relatively low flows with the retrograde.  After a liter of cardioplegia was administered, the myocardial septal temperature was 13 degrees Celsius.  The heart was elevated.  OM-2 was exposed.  The heart basket was used for retraction. An arteriotomy was made in OM-2, and this vessel easily accepted a 1.5-mm probe.  The vein was anastomosed end-to-side with a running 7-0 Prolene suture.  A probe passed easily proximally and distally. Cardioplegia was administered down the vein, there was good flow and good hemostasis.  The heart basket was removed.  Additional cardioplegia was administered retrograde and now there was a normal pressure response and good flow through the retrograde cannula.  Next, the posterior descending branch of the right coronary was identified and dissected out.  An arteriotomy was made.  A 1.5-mm probe passed easily distally.  The vein was anastomosed end-to-side with a running 7-0 Prolene suture.  Again, the vein was of fair quality. A probe passed  easily proximally and distally.  At the completion anastomosis, cardioplegia was administered.  There was good flow and good hemostasis.  The left internal mammary artery was brought through a window in the pericardium.  The distal end was beveled.  It was anastomosed end-to- side to the distal LAD.  The LAD was a 1.5 mm good quality target.  The mammary was a 2 mm good quality conduit.  An end-to-side anastomosis was performed with a running 8-0 Prolene suture.  At the completion of the anastomosis, the bulldog clamp was briefly removed.  Rapid septal rewarming was noted.  The bulldog clamp was replaced, and the mammary pedicle was tacked to the epicardial surface of the heart with 6-0 Prolene sutures.  Additional cardioplegia was administered  via the retrograde cannula. Additional cardioplegia was administered at 20-minute intervals during the valve replacement portion of the procedure.  An arteriotomy was made just above the sino-tubular junction and extended into the non-coronary sinus.  The aortic valve was inspected.  It was tricuspid.  The leaflets were fairly heavily calcified and stenotic there was partial fusion of the left and right cusps.  The valve leaflets were excised.  There was moderate annular calcification.  This was debrided taking care to contain all calcific debris.  The annulus was copiously irrigated with iced saline.  The coronary ostia were inspected and they were well clear of the annulus.  The anulus sized for a 23 mm [facility] Ease pericardial valve.  2-0 Ethibond horizontal mattress sutures with subannular pledgets were placed circumferentially around the annulus, 15 sutures were utilized.  The valve was rinsed per manufacturer's recommendations.  The sutures then were passed through the sewing ring of the valve.  The valve was lowered into place and sutures were sequentially tied.  The coronary ostia were inspected and were free of any impingement.  The valve was well seated.  A fine tip right-angle was used to probe the annulus, and no gaps were noted.  The aortotomy was closed in 2 layers with a running 4-0 Prolene horizontal mattress suture followed by running 4-0 Prolene simple suture.  Rewarming was begun. The cardioplegia cannula was removed from the ascending aorta.  The vein grafts were cut to length.  The proximal vein graft anastomoses were performed to 4.0 mm punch aortotomies with running 6-0 Prolene sutures. At the completion of the final proximal anastomosis, a warm dose of retrograde cardioplegia was administered.  The bulldog clamp was removed from the left mammary artery.  De-airing was performed and the aortic crossclamp was removed.  Total crossclamp time was 135 minutes.   The patient required 2 defibrillations with 10 joules and then was in complete heart block.  While rewarming was completed, all proximal and distal anastomoses were inspected for hemostasis as was the aortotomy. Epicardial pacing wires were placed on the right ventricle and right atrium.  The patient was initially DDD paced at 90 beats per minute. Later, he was able to be atrially paced and was conducting at 90 beats per minute.  A dopamine infusion was initiated at 3 mcg/kg/minute.  When the patient had rewarmed to a core temperature of 37 degrees Celsius, he was weaned from cardiopulmonary bypass on the first attempt.  Total bypass time was 186 minutes.  Postbypass transesophageal echocardiography revealed no change in left ventricular function.  There was good function of the prosthetic valve with no perivalvular leaks. There was no residual air noted on the echocardiogram.  It should be noted that carbon  dioxide was insufflated into the chest before and during the open portion of the procedure.  A test dose of protamine was administered.  The left ventricular vent, retrograde cardioplegia cannula and atrial cannula were removed.  A test dose of protamine was administered and was well tolerated.  The aortic cannula was removed as well as the McGoon needle which had been placed into the ascending aorta while weaning from bypass.  There was good hemostasis at all these sites.  The remainder of the protamine was administered without incident.  The chest was irrigated with warm saline.  Hemostasis was achieved.  The pericardium was reapproximated over the aorta and base of the heart with interrupted 3-0 silk sutures.  Left pleural and mediastinal tubes were placed through separate subcostal incisions.  The sternum was closed with a combination of single and double heavy gauge stainless steel wires.  The pectoralis fascia, subcutaneous tissue, and skin were closed in standard fashion.  Of  note, there was a missing plastic bulldog clamp on the final count.  An inspection had been made in the pericardium and in the left pleural space prior to closing the chest, and no evidence of the clamp was noted.  The patient was still in the operating room.  A chest x-ray was performed and showed no evidence of retained foreign body.  The patient then was taken from the operating room to the Surgical Intensive Care Unit intubated and in good condition.     Salvatore DecentSteven C. Dorris FetchHendrickson, M.D.     SCH/MEDQ  D:  07/09/2015  T:  07/09/2015  Job:  [id number]

## 2015-07-09 NOTE — Anesthesia Procedure Notes (Addendum)
Central Venous Catheter Insertion Performed by: anesthesiologist 07/09/2015 7:20 AM Patient location: Pre-op. Preanesthetic checklist: patient identified, IV checked, site marked, risks and benefits discussed, surgical consent, monitors and equipment checked, pre-op evaluation, timeout performed and anesthesia consent Lidocaine 1% used for infiltration Landmarks identified Catheter size: 8.5 Fr Sheath introducer Procedure performed using ultrasound guided technique. Attempts: 1 Following insertion, line sutured and dressing applied. Post procedure assessment: blood return through all ports, free fluid flow and no air. Patient tolerated the procedure well with no immediate complications.    Central Venous Catheter Insertion Performed by: anesthesiologist 07/09/2015 7:30 AM Patient location: Pre-op. Preanesthetic checklist: patient identified, IV checked, site marked, risks and benefits discussed, surgical consent, monitors and equipment checked, pre-op evaluation, timeout performed and anesthesia consent Landmarks identified PA cath was placed.Swan type and PA catheter depth:thermodilationProcedure performed using ultrasound guided technique. Attempts: 1 Patient tolerated the procedure well with no immediate complications.    Procedure Name: Intubation Date/Time: 07/09/2015 8:23 AM Performed by: Dairl PonderJIANG, Myking Sar Pre-anesthesia Checklist: Patient identified, Emergency Drugs available, Suction available, Patient being monitored and Timeout performed Patient Re-evaluated:Patient Re-evaluated prior to inductionOxygen Delivery Method: Circle system utilized Preoxygenation: Pre-oxygenation with 100% oxygen Intubation Type: IV induction Ventilation: Mask ventilation without difficulty, Oral airway inserted - appropriate to patient size and Two handed mask ventilation required Laryngoscope Size: Glidescope and 4 (for anticipated difficult airway. grade I view, vocal cord appeared normal.) Grade  View: Grade I Tube type: Oral Tube size: 8.0 mm Number of attempts: 1 Airway Equipment and Method: Stylet Placement Confirmation: ETT inserted through vocal cords under direct vision,  breath sounds checked- equal and bilateral and positive ETCO2 Secured at: 23 cm Tube secured with: Tape Dental Injury: Teeth and Oropharynx as per pre-operative assessment  Difficulty Due To: Difficulty was anticipated

## 2015-07-09 NOTE — Progress Notes (Addendum)
Received notice about patient being on an insulin pump. Noted that insulin pump was discontinued at 0650 and IV insulin drip was started for surgery per staff RN note today.  Will continue to monitor blood sugars while in the hospital. Smith MinceKendra Onix Jumper RN BSN CDE

## 2015-07-09 NOTE — Progress Notes (Signed)
Patient ID: London Pepperichard L Weigel, male   DOB: 10/27/1950, 65 y.o.   MRN: [id number]  SICU Evening Rounds:   Hemodynamically stable on dop 3 CI = 2  Has started to wake up on vent.    Urine output good  CT output low  CBC    Component Value Date/Time   WBC 9.2 07/09/2015 1553   RBC 3.58* 07/09/2015 1553   HGB 10.9* 07/09/2015 1558   HCT 32.0* 07/09/2015 1558   PLT 127* 07/09/2015 1553   MCV 89.4 07/09/2015 1553   MCH 31.8 07/09/2015 1553   MCHC 35.6 07/09/2015 1553   RDW 12.3 07/09/2015 1553   LYMPHSABS 1430 06/18/2015 0816   MONOABS 520 06/18/2015 0816   EOSABS 260 06/18/2015 0816   BASOSABS 65 06/18/2015 0816     BMET    Component Value Date/Time   NA 139 07/09/2015 1558   K 4.2 07/09/2015 1558   CL 102 07/06/2015 0855   CO2 24 07/06/2015 0855   GLUCOSE 187* 07/09/2015 1558   BUN 14 07/06/2015 0855   CREATININE 0.91 07/06/2015 0855   CREATININE 0.91 06/18/2015 0816   CALCIUM 8.7* 07/06/2015 0855   GFRNONAA >60 07/06/2015 0855   GFRAA >60 07/06/2015 0855     A/P:  Stable postop course. Continue current plans

## 2015-07-09 NOTE — Progress Notes (Signed)
RT NOTE:  Cardiac Rapid Wean initiated. Pt is waking up at this time and breathing over the vent. RT will monitor. RN aware,.

## 2015-07-09 NOTE — Procedures (Addendum)
Extubation Procedure Note  Patient Details:   Name: London PepperRichard L Kennebrew DOB: 05/16/1950 MRN: [id number]   Airway Documentation:  Pre Extubation: Rapid wean complete without complications. Pt follows all commands. ABG normal, NIF: -22, VC: 740 mls, CUFF LEAK PRESENT.  Post Intubation: Pt speaks name/location w/o difficulty. Pt states "something is hung in throat". No Stridor. Pt is coughing thick secretions up and RT explained there is possibility of swelling from intubation. Pt on 6L [place]. RN will titrate and keep RT aware of any difficulty.   Evaluation  O2 sats: stable throughout Complications: No apparent complications Patient did tolerate procedure well. Bilateral Breath Sounds: Rhonchi, Diminished   Yes  Elmer PickerClifton, Keishawn Rajewski D 07/09/2015, 8:25 PM

## 2015-07-09 NOTE — Progress Notes (Signed)
Notified both CRNA MagdalenaFudan and DR. Hollis of patient having insulin pump and took it off at 0650. (CBG = 117 at 0645).  Left message for diabetes coordinator of patient being 800 am surgery today  Heart , Dr. Dorris FetchHendrickson.

## 2015-07-09 NOTE — Interval H&P Note (Signed)
History and Physical Interval Note:  07/09/2015 7:51 AM  [name]  has presented today for surgery, with the diagnosis of CAD  The various methods of treatment have been discussed with the patient and family. After consideration of risks, benefits and other options for treatment, the patient has consented to  Procedure(s): AORTIC VALVE REPLACEMENT (AVR) (N/A) CORONARY ARTERY BYPASS GRAFTING (CABG) (N/A) INTRAOPERATIVE TRANSESOPHAGEAL ECHOCARDIOGRAM (N/A) as a surgical intervention .  The patient's history has been reviewed, patient examined, no change in status, stable for surgery.  I have reviewed the patient's chart and labs.  Questions were answered to the patient's satisfaction.     Loreli SlotSteven C Hendrickson

## 2015-07-09 NOTE — H&P (View-Only) (Signed)
PCP is [name], MD Referring Provider is Runell Gess, MD  Chief Complaint  Patient presents with  . Coronary Artery Disease    eval for CABG/AVR...CATH 06/22/15, ECHO 01/13/15...having carotid sturdies next Tuesday    HPI: [name] is a 65 year old man with past medical history significant for type 1 diabetes, hypertension, hyperlipidemia, coronary artery disease, aortic stenosis, thyroidectomy with resulting hypothyroidism, carotid stenosis with a previous right internal carotid stent January 2016, peripheral arterial disease, gastroesophageal reflux, venous insufficiency, obstructive sleep apnea for which he uses CPAP at night. He recently saw Dr. [name] in follow-up. An echocardiogram back in January had shown progression of his aortic stenosis from moderate to moderately severe with a mean gradient of 32 mmHg (up from 21 mmHg) and a peak gradient of 52 mmHg. His valve area was calculated at 0.9.  He mentioned to Dr. [name] that he been having shortness of breath with exertion and frequent dizzy spells with exertion. He's been getting short of breath with exertion for a couple of years but it now requires much less exertion and it did previously. He gets short of breath walking 50 feet to his mailbox and with less than one flight of stairs. He does not have any chest pain, pressure, or tightness associated with the shortness of breath. He also has been having dizziness with exertion which has worsened recently as well.  Dr. [name] recommended cardiac catheterization which was done last week. It showed severe three-vessel coronary disease. His PA pressures were normal. His calculated valve area was 1.09 cm with a mean gradient of 33 mmHg.  He was hospitalized back in May after having a series of TIA events. Initially had right tongue numbness followed by right tongue and chin numbness. He then had an episode where he had right arm and leg numbness. Although symptoms have resolved. He  does have some residual weakness in his right hand versus the left from a prior stroke.   Past Medical History  Diagnosis Date  . Diabetes mellitus Dx'd age 65    Novolog via insulin pump; sub-optimal control x years  . High cholesterol     Takes 1/2 of 80mg  atorvastatin daily  . Hypothyroidism     Thyroidectomy 2002; multinodular goiter  . Erectile dysfunction     Se[place] urologist in [place]  . Diabetic retinopathy     Proliferative: Hx of retinal detachment on right-light perception only in right eye  . History of tobacco abuse     Quit about 1990 (has 35 pack-yr hx)  . OSA (obstructive sleep apnea)     06/2011 sleep study: moderate OSA, CPAP at 12 cm H2O.  . Impaired vision     right eye light perception only; hx of retinal detachment.  . Recurrent pneumonia   . GERD (gastroesophageal reflux disease)   . Venous insufficiency   . Diastolic dysfunction 2007; 2016    TEE with diastolic dysfunction, EF 74%.  . Diabetic neuropathy (HCC)     fine touch and position sense affected  . Aortic stenosis, moderate Sept/Oct /2013    Mild/mod by echo; mild by cath 10/28/11.  Mild progression by echo 05/2012.  Further mild progression on 01/2014 echo (valve area 0.93 cm2).  Further progression on echo 01/2015.  Cath planned as of 06/2015 (Dr. [name])  . Carotid stenosis, right 09/2011    Dr. [name] did R carotid stenting 01/2014  . CAD, multiple vessel 10/2011    Inferolateral perfusion defect + EKG abnormality during stress  testing prompted Cath by [facility]; 3V CAD, EF normal.  Med mgmt recommended.  . Abnormal stress test 10/27/2011    lat isch--cardiac cath 10/28/2011  . Venous reflux 10/14/2011    venous doppler-R GSV continuous reflux throughout; too small for VNUS closure  . Decreased pedal pulses     LE doppler 11/08/11- no evidence of arterial insufficiency  . TIA (transient ischemic attack) 2015/16    with R carotid dz: pt got R carotid stent 01/2014 by Dr. [name] and was put on dual antiplatelet  therapy with ASA 325mg  and plavix 75mg  qd afterwards  . Osteoarthritis     Bilat thumb carpometacarpal joints.  . Impingement syndrome of right shoulder   . TIA (transient ischemic attack) 05/2015    Left brain (right sided numbness + slurred speech)  Admitted for obs/workup 05/2015.    Past Surgical History  Procedure Laterality Date  . Thyroid surgery  2002  . Lasix eye    . Retinal detachment surgery      Right eye  . Cataract extraction      left  . Transthoracic echocardiogram  10/2011;12/2012;01/2015    Mod AS, mild LVH, EF 60-65%, no RWMA.  01/2015 EF 60-65%, grade I DD, progression of mod/sev AS--being followed by Dr. [name]  . Cardiac catheterization  10/2011    EF normal.  Diffuse 3 vessel CAD, no stents placed.  Medical mgmt per [facility].  [name] Left and right heart catheterization with coronary angiogram N/A 10/28/2011    Procedure: LEFT AND R0IHT HEART CATHETERIZATION WITH CORONARY ANGIOGRAM;  Surgeon: Lennette Bihari, MD;  Location: [facility] CATH LAB;  Service: Cardiovascular;  Laterality: N/A;  . Carotid stent insertion Right 01/16/2014    Procedure: CAROTID STENT INSERTION;  Surgeon: Runell Gess, MD;  Location: [facility] CATH LAB;  Service: Cardiovascular;  Laterality: Right;  . Eye surgery      Multiple laser surgeries for diabetic retinopathy, also cataract surgery OU.  . Cardiac catheterization N/A 06/22/2015    Procedure: Right/Left Heart Cath and Coronary Angiography;  Surgeon: Runell Gess, MD;  Location: [facility] INVASIVE CV LAB;  Service: Cardiovascular;  Laterality: N/A;    Family History  Problem Relation Age of Onset  . Cancer Mother     lung cancer  . Alcohol abuse Father   . Cancer Father     laryngeal cancer  . Cancer Brother     oldest brother had lung cancer and melanoma    Social History Social History  Substance Use Topics  . Smoking status: Former Games developer  . Smokeless tobacco: Former Neurosurgeon    Quit date: 06/22/1987  . Alcohol Use: 0.0 oz/week    0 Standard  drinks or equivalent per week    Current Outpatient Prescriptions  Medication Sig Dispense Refill  . aspirin EC 81 MG tablet Take 81 mg by mouth daily.    . clopidogrel (PLAVIX) 75 MG tablet Take 1 tablet (75 mg total) by mouth daily. 90 tablet 3  . clotrimazole-betamethasone (LOTRISONE) cream Apply 1 application topically daily as needed (RASH). (Patient taking differently: Apply 1 application topically daily as needed (For rash.). ) 45 g 2  . FEVERFEW PO Take 1 capsule by mouth 3 (three) times daily as needed (For migraines.).     . Insulin Human (INSULIN PUMP) SOLN Inject into the skin continuous. Basal rate .95/hr, current pump settings: 12am .45, 2am .65, 7am .95, 11am .95, 5pm .75, 11pm .5. Uses Novolog Insulin.    [name] levothyroxine (  SYNTHROID, LEVOTHROID) 137 MCG tablet Take 1 tablet (137 mcg total) by mouth daily. 90 tablet 1  . ranitidine (ZANTAC) 150 MG tablet Take 150 mg by mouth at bedtime.     . tadalafil (CIALIS) 20 MG tablet Take 1 tablet (20 mg total) by mouth daily as needed for erectile dysfunction. 10 tablet 1   No current facility-administered medications for this visit.    Allergies  Allergen Reactions  . Rosuvastatin Calcium Other (See Comments)    Problem with higher dosages (Lipitor caused memory issues), also caused leg pains and lethargy  . Statins Other (See Comments)    Problem with higher dosages (Lipitor caused memory issues), also caused leg pains and lethargy  . Tape Rash and Other (See Comments)    Adhesive tape.  (Paper tape OK)    Review of Systems  Constitutional: Positive for activity change and fatigue. Negative for fever, chills and unexpected weight change.  HENT: Negative for dental problem (had teeth cleaned 2 months ago), trouble swallowing and voice change.        Sore throat  Respiratory: Positive for apnea (CPAP at night) and shortness of breath.   Cardiovascular: Positive for leg swelling (R>L). Negative for chest pain and palpitations.   Gastrointestinal: Negative for blood in stool.       Heartburn  Musculoskeletal: Positive for joint swelling and arthralgias.  Neurological: Positive for dizziness, weakness (right hand) and numbness. Negative for facial asymmetry.       Memory problems  Hematological: Negative for adenopathy. Bruises/bleeds easily.  Psychiatric/Behavioral: Negative for dysphoric mood. The patient is not nervous/anxious.   All other systems reviewed and are negative.   BP 152/74 mmHg  Pulse 105  Resp 16  Ht 6\' 2"  (1.88 m)  Wt 218 lb (98.884 kg)  BMI 27.98 kg/m2  SpO2 97% Physical Exam  Constitutional: He is oriented to person, place, and time. He appears well-developed and well-nourished. No distress.  HENT:  Head: Normocephalic and atraumatic.  Mouth/Throat: No oropharyngeal exudate.  Eyes: EOM are normal. Pupils are equal, round, and reactive to light.  Neck: Neck supple. No thyromegaly (well healed thyroidectomy scar) present.  Left carotid bruit, Right suspected transmitted murmur  Cardiovascular:  Murmur (3/6 crescendo/ decrescendo) heard. Pulmonary/Chest: Effort normal and breath sounds normal. No respiratory distress. He has no wheezes. He has no rales.  Abdominal: Soft. He exhibits no distension. There is no tenderness.  Musculoskeletal: He exhibits edema (1+ right ankle).  Lymphadenopathy:    He has no cervical adenopathy.  Neurological: He is alert and oriented to person, place, and time. No cranial nerve deficit. He exhibits normal muscle tone.  Right grip slightly weaker than left, thenar wasting R>L  Skin: Skin is warm and dry.  Psychiatric: He has a normal mood and affect.  Vitals reviewed.    Diagnostic Tests: ECHOCARDIOGRAM Jan 2017 Study Conclusions  - Left ventricle: The cavity size was normal. There was mild  concentric hypertrophy. Systolic function was normal. The  estimated ejection fraction was in the range of 60% to 65%. Wall  motion was normal; there were  no regional wall motion  abnormalities. Doppler parameters are consistent with abnormal  left ventricular relaxation (grade 1 diastolic dysfunction). - Aortic valve: Valve mobility was restricted. There was moderate  stenosis. Peak velocity (S): 360 cm/s. Mean gradient (S): 32 mm  Hg.  Impressions:  - Aortic stenosis has progressed (21mmHg prior mean).  Transthoracic echocardiography. M-mode, complete 2D, spectral Doppler, and color Doppler. Birthdate: Patient  birthdate: 08-12-1950. Age: Patient is 65 yr old. Sex: Gender: male. BMI: 27.6 kg/m^2. Blood pressure: 140/64 Patient status: Outpatient. Study date: Study date: 01/13/2015. Study time: 08:36 AM.  -------------------------------------------------------------------  ------------------------------------------------------------------- Left ventricle: The cavity size was normal. There was mild concentric hypertrophy. Systolic function was normal. The estimated ejection fraction was in the range of 60% to 65%. Wall motion was normal; there were no regional wall motion abnormalities. Doppler parameters are consistent with abnormal left ventricular relaxation (grade 1 diastolic dysfunction).  ------------------------------------------------------------------- Aortic valve: Trileaflet; moderately thickened, moderately calcified leaflets. Valve mobility was restricted. Doppler: There was moderate stenosis. There was no regurgitation. VTI ratio of LVOT to aortic valve: 0.27. Valve area (VTI): 0.92 cm^2. Indexed valve area (VTI): 0.41 cm^2/m^2. Peak velocity ratio of LVOT to aortic valve: 0.25. Valve area (Vmax): 0.88 cm^2. Indexed valve area (Vmax): 0.39 cm^2/m^2. Mean velocity ratio of LVOT to aortic valve: 0.26. Valve area (Vmean): 0.89 cm^2. Indexed valve area (Vmean): 0.39 cm^2/m^2. Mean gradient (S): 32 mm Hg. Peak gradient (S): 52 mm  Hg.  ------------------------------------------------------------------- Aorta: Aortic root: The aortic root was normal in size.  CARDIAC CATHETERIZATION   Dominance: Right   Left Anterior Descending   . Prox LAD lesion, 70% stenosed.   . Mid LAD to Dist LAD lesion, 80% stenosed.     Left Circumflex   . Mid Cx lesion, 85% stenosed.   . Second Obtuse Marginal Branch   . Ost 2nd Mrg to 2nd Mrg lesion, 90% stenosed.     Right Coronary Artery   . Ost RCA to Prox RCA lesion, 60% stenosed.   . Mid RCA lesion, 80% stenosed.   . Mid RCA to Dist RCA lesion, 90% stenosed.   . Dist RCA lesion, 90% stenosed.       Right Heart Pressures Right atrial pressure-11/9 Right ventricular pressure-38/8 Pulmonary artery pressure-39/17, mean 26 Pulmonary capillary wedge pressure-A wave 20, V wave 17, mean 16 Cardiac output (Fick)-6.63 L/m, (Thermo) 6.75 L/m Aortic gradient 33 mmHg Aortic valve area 1.09 cm2    I personally reviewed the cath films and concur with the findings noted above  Impression:  [name] is a 65 year old man with type 1 diabetes, hyperlipidemia, hypertension, known coronary disease, known aortic stenosis, extracranial carotid disease, peripheral arterial disease, disequilibrium syndrome, and obstructive sleep apnea. He presents with shortness of breath on exertion and presyncope with exertion. He has not had any frank syncopal spells. At cardiac catheterization he has three-vessel coronary disease and also moderately severe aortic stenosis. Coronary artery bypass grafting is indicated for survival benefit and aortic valve replacement is appropriate at the time of bypass grafting.  I have discussed the general nature of the procedure, the need for general anesthesia, the use of cardiopulmonary bypass, and the incisions to be used with Mr and Mrs Finch. We discussed the expected hospital stay, overall recovery and short and long term outcomes. They understand the risks  include, but are not limited to death, stroke, MI, DVT/PE, bleeding, possible need for transfusion, infections, arrhythmias, heart block requiring pacemaker as well as other organ system dysfunction including respiratory, renal, or GI complications.   He accepts the risks and agrees to proceed.  We discussed the advantages and disadvantages of tissue versus mechanical valves. They understand the pros and cons of each. He wishes to avoid need for lifelong anticoagulation and opts for a tissue valve understanding the possible need for repeat operation or some other type of intervention in the future.  He will need  to be off of Plavix for 5 days prior to surgery. He has had a recent hospitalization for TIA symptoms and has a previous right carotid stent. We need Dr. Marlis EdelsonSethi's input as to the safety of aVR CABG from a neurologic standpoint, and also safety of discontinuing Plavix prior to surgery. He has an appointment with him in mid July we will see if we can move that up.  He does have some venous insufficiency in the right leg greater than the left. We will plan to use saphenous vein from the left leg if possible.  Plan:  Needs clearance from Dr. Pearlean BrownieSethi due to recent TIAs, stroke history  Carotid duplex- next Wed - will see if we can move this up  AVR/ CABG on Thursday 07/09/2015 pending above. Will stop plavix 5 days prior to surgery.  Loreli SlotSteven C Hendrickson, MD [facility] [phone]

## 2015-07-09 NOTE — Progress Notes (Addendum)
Patient having difficulty breathing post extubation and is drowsy. Patient has thick blood tinged secretions that he is able to cough up. O2 saturation ranging 85-88% with several apneic episodes. Patient communicated need for c-pap which he wears at home. Wife brought in his cpap tubing and RT connected to patient. Collected a gas 30 minutes after extubation and called MD Bartle to notify of patients status. MD Bartle said okay to put patient on bipap to keep patients o2 saturation greater than 92%. RT at bedside to put patient on bi-pap. Will continue to monitor patient and the need for bi-pap.  Horton ChinMacKayla A Stephinie Battisti, RN

## 2015-07-09 NOTE — Progress Notes (Signed)
RT NOTE:  Cardiac wean continued to CPAP/PS.

## 2015-07-09 NOTE — Anesthesia Postprocedure Evaluation (Signed)
Anesthesia Post Note  Patient: [name]  Procedure(s) Performed: Procedure(s) (LRB): AORTIC VALVE REPLACEMENT (AVR) (N/A) CORONARY ARTERY BYPASS GRAFTING (CABG)TIMES THREE USING LEFT INTERNAL MAMMARY ARTERY AND LEFT SAPHENOUS LEG VEIN HARVESTED ENDOSCOPICALLY (N/A) INTRAOPERATIVE TRANSESOPHAGEAL ECHOCARDIOGRAM (N/A)  Patient location during evaluation: SICU Anesthesia Type: General Level of consciousness: sedated Pain management: pain level controlled Vital Signs Assessment: post-procedure vital signs reviewed and stable Respiratory status: patient remains intubated per anesthesia plan Cardiovascular status: stable Anesthetic complications: no    Last Vitals:  Filed Vitals:   07/09/15 1645 07/09/15 1700  BP:  90/62  Pulse: 89 89  Temp: 36.2 C 36.1 C  Resp: 12 12    Last Pain: There were no vitals filed for this visit.               Shelton SilvasKevin D Nirel Babler

## 2015-07-09 NOTE — Transfer of Care (Signed)
Immediate Anesthesia Transfer of Care Note  Patient: [name]  Procedure(s) Performed: Procedure(s): AORTIC VALVE REPLACEMENT (AVR) (N/A) CORONARY ARTERY BYPASS GRAFTING (CABG)TIMES THREE USING LEFT INTERNAL MAMMARY ARTERY AND LEFT SAPHENOUS LEG VEIN HARVESTED ENDOSCOPICALLY (N/A) INTRAOPERATIVE TRANSESOPHAGEAL ECHOCARDIOGRAM (N/A)  Patient Location: SICU  Anesthesia Type:General  Level of Consciousness: Patient remains intubated per anesthesia plan  Airway & Oxygen Therapy: Patient remains intubated per anesthesia plan  Post-op Assessment: Report given to RN and Post -op Vital signs reviewed and stable  Post vital signs: Reviewed and stable  Last Vitals:  Filed Vitals:   07/09/15 0632 07/09/15 1555  BP: 143/75 116/50  Pulse: 72 90  Temp: 36.7 C   Resp: 20 12    Last Pain: There were no vitals filed for this visit.    Patients Stated Pain Goal: 2 (07/09/15 [id number])  Complications: No apparent anesthesia complications

## 2015-07-09 NOTE — Progress Notes (Signed)
  Echocardiogram Echocardiogram Transesophageal has been performed.  Janalyn HarderWest, Dameka Younker R 07/09/2015, 9:23 AM

## 2015-07-10 ENCOUNTER — Encounter (HOSPITAL_COMMUNITY): Payer: Self-pay | Admitting: Thoracic Surgery (Cardiothoracic Vascular Surgery)

## 2015-07-10 ENCOUNTER — Inpatient Hospital Stay (HOSPITAL_COMMUNITY): Payer: PRIVATE HEALTH INSURANCE

## 2015-07-10 DIAGNOSIS — Z954 Presence of other heart-valve replacement: Secondary | ICD-10-CM

## 2015-07-10 LAB — GLUCOSE, CAPILLARY
GLUCOSE-CAPILLARY: 105 mg/dL — AB (ref 65–99)
GLUCOSE-CAPILLARY: 110 mg/dL — AB (ref 65–99)
GLUCOSE-CAPILLARY: 111 mg/dL — AB (ref 65–99)
GLUCOSE-CAPILLARY: 136 mg/dL — AB (ref 65–99)
GLUCOSE-CAPILLARY: 138 mg/dL — AB (ref 65–99)
GLUCOSE-CAPILLARY: 143 mg/dL — AB (ref 65–99)
GLUCOSE-CAPILLARY: 158 mg/dL — AB (ref 65–99)
GLUCOSE-CAPILLARY: 82 mg/dL (ref 65–99)
Glucose-Capillary: 131 mg/dL — ABNORMAL HIGH (ref 65–99)
Glucose-Capillary: 120 mg/dL — ABNORMAL HIGH (ref 65–99)
Glucose-Capillary: 120 mg/dL — ABNORMAL HIGH (ref 65–99)
Glucose-Capillary: 103 mg/dL — ABNORMAL HIGH (ref 65–99)
Glucose-Capillary: 116 mg/dL — ABNORMAL HIGH (ref 65–99)
Glucose-Capillary: 114 mg/dL — ABNORMAL HIGH (ref 65–99)
Glucose-Capillary: 104 mg/dL — ABNORMAL HIGH (ref 65–99)
Glucose-Capillary: 98 mg/dL (ref 65–99)
Glucose-Capillary: 102 mg/dL — ABNORMAL HIGH (ref 65–99)
Glucose-Capillary: 114 mg/dL — ABNORMAL HIGH (ref 65–99)
Glucose-Capillary: 107 mg/dL — ABNORMAL HIGH (ref 65–99)
Glucose-Capillary: 108 mg/dL — ABNORMAL HIGH (ref 65–99)
Glucose-Capillary: 132 mg/dL — ABNORMAL HIGH (ref 65–99)
Glucose-Capillary: 112 mg/dL — ABNORMAL HIGH (ref 65–99)

## 2015-07-10 LAB — BASIC METABOLIC PANEL
Anion gap: 4 — ABNORMAL LOW (ref 5–15)
BUN: 16 mg/dL (ref 6–20)
CO2: 25 mmol/L (ref 22–32)
CREATININE: 1.05 mg/dL (ref 0.61–1.24)
Calcium: 7.7 mg/dL — ABNORMAL LOW (ref 8.9–10.3)
Chloride: 110 mmol/L (ref 101–111)
GFR calc Af Amer: >60 mL/min (ref >60)
GLUCOSE: 118 mg/dL — AB (ref 65–99)
POTASSIUM: 4.3 mmol/L (ref 3.5–5.1)
Sodium: 139 mmol/L (ref 135–145)

## 2015-07-10 LAB — POCT I-STAT, CHEM 8
BUN: 17 mg/dL (ref 6–20)
BUN: 13 mg/dL (ref 6–20)
BUN: 15 mg/dL (ref 6–20)
BUN: 13 mg/dL (ref 6–20)
BUN: 14 mg/dL (ref 6–20)
BUN: 14 mg/dL (ref 6–20)
BUN: 18 mg/dL (ref 6–20)
CALCIUM ION: 1.15 mmol/L (ref 1.12–1.23)
CALCIUM ION: 1.03 mmol/L — AB (ref 1.12–1.23)
CALCIUM ION: 1.15 mmol/L (ref 1.12–1.23)
CHLORIDE: 101 mmol/L (ref 101–111)
CHLORIDE: 102 mmol/L (ref 101–111)
CHLORIDE: 99 mmol/L — AB (ref 101–111)
CHLORIDE: 97 mmol/L — AB (ref 101–111)
CHLORIDE: 98 mmol/L — AB (ref 101–111)
CHLORIDE: 102 mmol/L (ref 101–111)
CREATININE: 0.6 mg/dL — AB (ref 0.61–1.24)
CREATININE: 0.6 mg/dL — AB (ref 0.61–1.24)
CREATININE: 0.6 mg/dL — AB (ref 0.61–1.24)
CREATININE: 0.9 mg/dL (ref 0.61–1.24)
Calcium, Ion: 1.2 mmol/L (ref 1.12–1.23)
Calcium, Ion: 0.98 mmol/L — ABNORMAL LOW (ref 1.12–1.23)
Calcium, Ion: 1.02 mmol/L — ABNORMAL LOW (ref 1.12–1.23)
Calcium, Ion: 1.11 mmol/L — ABNORMAL LOW (ref 1.12–1.23)
Chloride: 101 mmol/L (ref 101–111)
Creatinine, Ser: 0.4 mg/dL — ABNORMAL LOW (ref 0.61–1.24)
Creatinine, Ser: 0.6 mg/dL — ABNORMAL LOW (ref 0.61–1.24)
Creatinine, Ser: 0.6 mg/dL — ABNORMAL LOW (ref 0.61–1.24)
GLUCOSE: 118 mg/dL — AB (ref 65–99)
GLUCOSE: 76 mg/dL (ref 65–99)
Glucose, Bld: 87 mg/dL (ref 65–99)
Glucose, Bld: 95 mg/dL (ref 65–99)
Glucose, Bld: 128 mg/dL — ABNORMAL HIGH (ref 65–99)
Glucose, Bld: 214 mg/dL — ABNORMAL HIGH (ref 65–99)
Glucose, Bld: 161 mg/dL — ABNORMAL HIGH (ref 65–99)
HCT: 37 % — ABNORMAL LOW (ref 39.0–52.0)
HCT: 26 % — ABNORMAL LOW (ref 39.0–52.0)
HCT: 30 % — ABNORMAL LOW (ref 39.0–52.0)
HCT: 26 % — ABNORMAL LOW (ref 39.0–52.0)
HCT: 26 % — ABNORMAL LOW (ref 39.0–52.0)
HEMATOCRIT: 26 % — AB (ref 39.0–52.0)
HEMATOCRIT: 26 % — AB (ref 39.0–52.0)
HEMOGLOBIN: 8.8 g/dL — AB (ref 13.0–17.0)
Hemoglobin: 12.6 g/dL — ABNORMAL LOW (ref 13.0–17.0)
Hemoglobin: 10.2 g/dL — ABNORMAL LOW (ref 13.0–17.0)
Hemoglobin: 8.8 g/dL — ABNORMAL LOW (ref 13.0–17.0)
Hemoglobin: 8.8 g/dL — ABNORMAL LOW (ref 13.0–17.0)
Hemoglobin: 8.8 g/dL — ABNORMAL LOW (ref 13.0–17.0)
Hemoglobin: 8.8 g/dL — ABNORMAL LOW (ref 13.0–17.0)
POTASSIUM: 4.4 mmol/L (ref 3.5–5.1)
POTASSIUM: 3.7 mmol/L (ref 3.5–5.1)
POTASSIUM: 5.4 mmol/L — AB (ref 3.5–5.1)
POTASSIUM: 4.5 mmol/L (ref 3.5–5.1)
Potassium: 3.8 mmol/L (ref 3.5–5.1)
Potassium: 4.1 mmol/L (ref 3.5–5.1)
Potassium: 4 mmol/L (ref 3.5–5.1)
SODIUM: 134 mmol/L — AB (ref 135–145)
Sodium: 139 mmol/L (ref 135–145)
Sodium: 139 mmol/L (ref 135–145)
Sodium: 141 mmol/L (ref 135–145)
Sodium: 133 mmol/L — ABNORMAL LOW (ref 135–145)
Sodium: 136 mmol/L (ref 135–145)
Sodium: 139 mmol/L (ref 135–145)
TCO2: 31 mmol/L (ref 0–100)
TCO2: 28 mmol/L (ref 0–100)
TCO2: 30 mmol/L (ref 0–100)
TCO2: 27 mmol/L (ref 0–100)
TCO2: 29 mmol/L (ref 0–100)
TCO2: 26 mmol/L (ref 0–100)
TCO2: 26 mmol/L (ref 0–100)

## 2015-07-10 LAB — CBC
HCT: 28.6 % — ABNORMAL LOW (ref 39.0–52.0)
HEMATOCRIT: 24.3 % — AB (ref 39.0–52.0)
HEMOGLOBIN: 8.4 g/dL — AB (ref 13.0–17.0)
Hemoglobin: 10.1 g/dL — ABNORMAL LOW (ref 13.0–17.0)
MCH: 31.6 pg (ref 26.0–34.0)
MCH: 31.1 pg (ref 26.0–34.0)
MCHC: 35.3 g/dL (ref 30.0–36.0)
MCHC: 34.6 g/dL (ref 30.0–36.0)
MCV: 89.4 fL (ref 78.0–100.0)
MCV: 90 fL (ref 78.0–100.0)
PLATELETS: 133 10*3/uL — AB (ref 150–400)
Platelets: 94 10*3/uL — ABNORMAL LOW (ref 150–400)
RBC: 3.2 MIL/uL — AB (ref 4.22–5.81)
RBC: 2.7 MIL/uL — AB (ref 4.22–5.81)
RDW: 12.5 % (ref 11.5–15.5)
RDW: 12.9 % (ref 11.5–15.5)
WBC: 12.8 10*3/uL — ABNORMAL HIGH (ref 4.0–10.5)
WBC: 13.1 10*3/uL — ABNORMAL HIGH (ref 4.0–10.5)

## 2015-07-10 LAB — POCT I-STAT 3, ART BLOOD GAS (G3+)
Acid-Base Excess: 4 mmol/L — ABNORMAL HIGH (ref 0.0–2.0)
Bicarbonate: 28 mEq/L — ABNORMAL HIGH (ref 20.0–24.0)
O2 SAT: 100 %
PCO2 ART: 37.8 mmHg (ref 35.0–45.0)
PH ART: 7.477 — AB (ref 7.350–7.450)
PO2 ART: 379 mmHg — AB (ref 80.0–100.0)
TCO2: 29 mmol/L (ref 0–100)

## 2015-07-10 LAB — CREATININE, SERUM
CREATININE: 1.08 mg/dL (ref 0.61–1.24)
GFR calc Af Amer: >60 mL/min (ref >60)
GFR calc non Af Amer: >60 mL/min (ref >60)

## 2015-07-10 LAB — MAGNESIUM
Magnesium: 2.4 mg/dL (ref 1.7–2.4)
Magnesium: 2.3 mg/dL (ref 1.7–2.4)

## 2015-07-10 MED ORDER — ASPIRIN EC 81 MG PO TBEC
81.0000 mg | DELAYED_RELEASE_TABLET | Freq: Every day | ORAL | Status: DC
  Administered 2015-07-10 – 2015-07-14 (×4): 81 mg via ORAL
  Filled 2015-07-10 (×4): qty 1

## 2015-07-10 MED ORDER — OXYCODONE HCL 5 MG PO TABS
5.0000 mg | ORAL_TABLET | ORAL | Status: DC | PRN
  Administered 2015-07-10: 10 mg via ORAL
  Administered 2015-07-10 – 2015-07-14 (×2): 5 mg via ORAL
  Filled 2015-07-10: qty 1
  Filled 2015-07-10 (×3): qty 2

## 2015-07-10 MED ORDER — ALBUMIN HUMAN 5 % IV SOLN
12.5000 g | Freq: Once | INTRAVENOUS | Status: AC
  Administered 2015-07-10: 12.5 g via INTRAVENOUS
  Filled 2015-07-10: qty 250

## 2015-07-10 MED ORDER — FUROSEMIDE 10 MG/ML IJ SOLN
20.0000 mg | Freq: Once | INTRAMUSCULAR | Status: AC
  Administered 2015-07-10: 20 mg via INTRAVENOUS

## 2015-07-10 MED ORDER — CHLORHEXIDINE GLUCONATE 0.12 % MT SOLN
15.0000 mL | Freq: Two times a day (BID) | OROMUCOSAL | Status: DC
  Administered 2015-07-10 – 2015-07-13 (×3): 15 mL via OROMUCOSAL
  Filled 2015-07-10 (×6): qty 15

## 2015-07-10 MED ORDER — CETYLPYRIDINIUM CHLORIDE 0.05 % MT LIQD
7.0000 mL | Freq: Two times a day (BID) | OROMUCOSAL | Status: DC
  Administered 2015-07-10 – 2015-07-12 (×4): 7 mL via OROMUCOSAL

## 2015-07-10 MED ORDER — CLOPIDOGREL BISULFATE 75 MG PO TABS
75.0000 mg | ORAL_TABLET | Freq: Every day | ORAL | Status: DC
  Administered 2015-07-11 – 2015-07-14 (×4): 75 mg via ORAL
  Filled 2015-07-10 (×4): qty 1

## 2015-07-10 MED ORDER — ENOXAPARIN SODIUM 40 MG/0.4ML ~~LOC~~ SOLN
40.0000 mg | Freq: Every day | SUBCUTANEOUS | Status: DC
  Administered 2015-07-10 – 2015-07-13 (×4): 40 mg via SUBCUTANEOUS
  Filled 2015-07-10 (×4): qty 0.4

## 2015-07-10 MED ORDER — ASPIRIN 81 MG PO CHEW: 81.0000 mg | CHEWABLE_TABLET | Freq: Every day | ORAL | Status: DC

## 2015-07-10 NOTE — Progress Notes (Signed)
1 Day Post-Op Procedure(s) (LRB): AORTIC VALVE REPLACEMENT (AVR) (N/A) CORONARY ARTERY BYPASS GRAFTING (CABG)TIMES THREE USING LEFT INTERNAL MAMMARY ARTERY AND LEFT SAPHENOUS LEG VEIN HARVESTED ENDOSCOPICALLY (N/A) INTRAOPERATIVE TRANSESOPHAGEAL ECHOCARDIOGRAM (N/A) Subjective: C/o incisional pain  Objective: Vital signs in last 24 hours: Temp:  [96.8 F (36 C)-100 F (37.8 C)] 99.9 F (37.7 C) (07/07 0700) Pulse Rate:  [87-94] 89 (07/07 0700) Cardiac Rhythm:  [-] Normal sinus rhythm;Atrial paced (07/07 0400) Resp:  [12-28] 21 (07/07 0700) BP: (87-131)/(44-63) 101/52 mmHg (07/07 0700) SpO2:  [86 %-100 %] 100 % (07/07 0700) Arterial Line BP: (90-143)/(42-68) 122/46 mmHg (07/07 0700) FiO2 (%):  [40 %-50 %] 50 % (07/06 2200) Weight:  [218 lb 14.7 oz (99.3 kg)-226 lb 10.1 oz (102.8 kg)] 226 lb 10.1 oz (102.8 kg) (07/07 0443)  Hemodynamic parameters for last 24 hours: PAP: (23-38)/(10-31) 24/19 mmHg CO:  [3.3 L/min-5.9 L/min] 5.3 L/min CI:  [1.5 L/min/m2-2.7 L/min/m2] 2.4 L/min/m2  Intake/Output from previous day: 07/06 0701 - 07/07 0700 In: 5719.7 [I.V.:3949.7; Blood:560; NG/GT:60; IV Piggyback:1150] Out: 6920 [Urine:5120; Emesis/NG output:50; Blood:1200; Chest Tube:550] Intake/Output this shift:    General appearance: cooperative, no distress and lethargic Neurologic: lethargic but nonfocal Heart: regular rate and rhythm Lungs: diminished breath sounds bibasilar Abdomen: soft nontder  Lab Results:  Recent Labs  07/09/15 2217 07/10/15 0425  WBC 12.2* 12.8*  HGB 10.4* 10.1*  HCT 30.1* 28.6*  PLT 128* 133*   BMET:  Recent Labs  07/09/15 2212 07/09/15 2217 07/10/15 0425  NA 141  --  139  K 4.2  --  4.3  CL 105  --  110  CO2  --   --  25  GLUCOSE 153*  --  118*  BUN 15  --  16  CREATININE 0.80 0.90 1.05  CALCIUM  --   --  7.7*    PT/INR:  Recent Labs  07/09/15 1553  LABPROT 18.2*  INR 1.50*   ABG    Component Value Date/Time   PHART 7.352 07/09/2015  2137   HCO3 24.6* 07/09/2015 2137   TCO2 24 07/09/2015 2212   ACIDBASEDEF 1.0 07/09/2015 2137   O2SAT 96.0 07/09/2015 2137   CBG (last 3)   Recent Labs  07/10/15 0401 07/10/15 0514 07/10/15 0555  GLUCAP 103* 116* 114*    Assessment/Plan: S/P Procedure(s) (LRB): AORTIC VALVE REPLACEMENT (AVR) (N/A) CORONARY ARTERY BYPASS GRAFTING (CABG)TIMES THREE USING LEFT INTERNAL MAMMARY ARTERY AND LEFT SAPHENOUS LEG VEIN HARVESTED ENDOSCOPICALLY (N/A) INTRAOPERATIVE TRANSESOPHAGEAL ECHOCARDIOGRAM (N/A) POD # 1  NEURO- exam nonfocal but is fairly lethargic- minimize narcotics and follow exam  Restart plavix tomorrow  CV- in SR, good index on 3 mcg/kg/min of dopamine  Dc swan, wean dopamine  ASA, beta blocker- cannot take statins  RESP- IS for bibasilar atelectasis  RENAL- creatinine Ok, diurese  ENDO- synthroid restarted  DM- well controlled with insulin drip - continue for today  DVT prophylaxis- SCD + enoxaparin  DC chest tubes  OOB, mobilize   LOS: 1 day    Loreli SlotSteven C Nathanel Tallman 07/10/2015

## 2015-07-10 NOTE — Progress Notes (Signed)
Subjective:  POD # 1 CABG X3/ bioprosthetic AVR. Stable.  Objective:  Temp:  [96.8 F (36 C)-100 F (37.8 C)] 99.7 F (37.6 C) (07/07 0800) Pulse Rate:  [87-94] 89 (07/07 0900) Resp:  [0-28] 0 (07/07 0900) BP: (87-131)/(44-63) 104/54 mmHg (07/07 0900) SpO2:  [86 %-100 %] 100 % (07/07 0900) Arterial Line BP: (90-143)/(42-68) 131/45 mmHg (07/07 0900) FiO2 (%):  [40 %-50 %] 50 % (07/06 2200) Weight:  [218 lb 14.7 oz (99.3 kg)-226 lb 10.1 oz (102.8 kg)] 226 lb 10.1 oz (102.8 kg) (07/07 0443) Weight change: 0 lb (0 kg)  Intake/Output from previous day: 07/06 0701 - 07/07 0700 In: 5719.7 [I.V.:3949.7; Blood:560; NG/GT:60; IV Piggyback:1150] Out: 1610 [Urine:5120; Emesis/NG output:50; Blood:1200; Chest Tube:550]  Intake/Output from this shift: Total I/O In: 82.4 [I.V.:82.4] Out: 410 [Urine:400; Chest Tube:10]  Physical Exam: General appearance: alert and no distress Neck: no adenopathy, no carotid bruit, no JVD, supple, symmetrical, trachea midline and thyroid not enlarged, symmetric, no tenderness/mass/nodules Lungs: clear to auscultation bilaterally Heart: regular rate and rhythm, S1, S2 normal, no murmur, click, rub or gallop Extremities: extremities normal, atraumatic, no cyanosis or edema  Lab Results: Results for orders placed or performed during the hospital encounter of 07/09/15 (from the past 48 hour(s))  Glucose, capillary     Status: Abnormal   Collection Time: 07/09/15  6:46 AM  Result Value Ref Range   Glucose-Capillary 117 (H) 65 - 99 mg/dL  Hemoglobin and hematocrit, blood     Status: Abnormal   Collection Time: 07/09/15  1:20 PM  Result Value Ref Range   Hemoglobin 9.1 (L) 13.0 - 17.0 g/dL    Comment: REPEATED TO VERIFY RESULT CALLED TO, READ BACK BY AND VERIFIED WITH: HARDY,K RN 135 07/09/15 BY E.BABIKIR    HCT 26.1 (L) 39.0 - 52.0 %    Comment: REPEATED TO VERIFY RESULT CALLED TO, READ BACK BY AND VERIFIED WITH: HARDY,K RN 135 07/09/15 BY E.BABIKIR   Platelet count     Status: Abnormal   Collection Time: 07/09/15  1:20 PM  Result Value Ref Range   Platelets 135 (L) 150 - 400 K/uL    Comment: REPEATED TO VERIFY RESULT CALLED TO, READ BACK BY AND VERIFIED WITH: HARDY,K RN 135 07/09/15 BY E.BABIKIR   CBC     Status: Abnormal   Collection Time: 07/09/15  3:53 PM  Result Value Ref Range   WBC 9.2 4.0 - 10.5 K/uL   RBC 3.58 (L) 4.22 - 5.81 MIL/uL   Hemoglobin 11.4 (L) 13.0 - 17.0 g/dL    Comment: REPEATED TO VERIFY   HCT 32.0 (L) 39.0 - 52.0 %   MCV 89.4 78.0 - 100.0 fL   MCH 31.8 26.0 - 34.0 pg   MCHC 35.6 30.0 - 36.0 g/dL   RDW 12.3 11.5 - 15.5 %   Platelets 127 (L) 150 - 400 K/uL  Protime-INR     Status: Abnormal   Collection Time: 07/09/15  3:53 PM  Result Value Ref Range   Prothrombin Time 18.2 (H) 11.6 - 15.2 seconds   INR 1.50 (H) 0.00 - 1.49  APTT     Status: None   Collection Time: 07/09/15  3:53 PM  Result Value Ref Range   aPTT 34 24 - 37 seconds  I-STAT 4, (NA,K, GLUC, HGB,HCT)     Status: Abnormal   Collection Time: 07/09/15  3:58 PM  Result Value Ref Range   Sodium 139 135 - 145 mmol/L   Potassium  4.2 3.5 - 5.1 mmol/L   Glucose, Bld 187 (H) 65 - 99 mg/dL   HCT 32.0 (L) 39.0 - 52.0 %   Hemoglobin 10.9 (L) 13.0 - 17.0 g/dL  I-STAT 3, arterial blood gas (G3+)     Status: Abnormal   Collection Time: 07/09/15  4:02 PM  Result Value Ref Range   pH, Arterial 7.358 7.350 - 7.450   pCO2 arterial 42.7 35.0 - 45.0 mmHg   pO2, Arterial 82.0 80.0 - 100.0 mmHg   Bicarbonate 24.3 (H) 20.0 - 24.0 mEq/L   TCO2 26 0 - 100 mmol/L   O2 Saturation 96.0 %   Acid-base deficit 2.0 0.0 - 2.0 mmol/L   Patient temperature 36.1 C    Collection site ARTERIAL LINE    Drawn by Nurse    Sample type ARTERIAL   Glucose, capillary     Status: Abnormal   Collection Time: 07/09/15  4:57 PM  Result Value Ref Range   Glucose-Capillary 175 (H) 65 - 99 mg/dL  Glucose, capillary     Status: Abnormal   Collection Time: 07/09/15  5:49 PM    Result Value Ref Range   Glucose-Capillary 160 (H) 65 - 99 mg/dL  Glucose, capillary     Status: Abnormal   Collection Time: 07/09/15  6:48 PM  Result Value Ref Range   Glucose-Capillary 137 (H) 65 - 99 mg/dL  Glucose, capillary     Status: Abnormal   Collection Time: 07/09/15  8:07 PM  Result Value Ref Range   Glucose-Capillary 134 (H) 65 - 99 mg/dL  I-STAT 3, arterial blood gas (G3+)     Status: Abnormal   Collection Time: 07/09/15  8:09 PM  Result Value Ref Range   pH, Arterial 7.307 (L) 7.350 - 7.450   pCO2 arterial 47.2 (H) 35.0 - 45.0 mmHg   pO2, Arterial 72.0 (L) 80.0 - 100.0 mmHg   Bicarbonate 23.7 20.0 - 24.0 mEq/L   TCO2 25 0 - 100 mmol/L   O2 Saturation 93.0 %   Acid-base deficit 3.0 (H) 0.0 - 2.0 mmol/L   Patient temperature 36.5 C    Sample type ARTERIAL   I-STAT 3, arterial blood gas (G3+)     Status: Abnormal   Collection Time: 07/09/15  8:56 PM  Result Value Ref Range   pH, Arterial 7.369 7.350 - 7.450   pCO2 arterial 42.9 35.0 - 45.0 mmHg   pO2, Arterial 106.0 (H) 80.0 - 100.0 mmHg   Bicarbonate 24.9 (H) 20.0 - 24.0 mEq/L   TCO2 26 0 - 100 mmol/L   O2 Saturation 98.0 %   Acid-base deficit 1.0 0.0 - 2.0 mmol/L   Patient temperature 36.7 C    Sample type ARTERIAL   Glucose, capillary     Status: Abnormal   Collection Time: 07/09/15  8:58 PM  Result Value Ref Range   Glucose-Capillary 129 (H) 65 - 99 mg/dL  I-STAT 3, arterial blood gas (G3+)     Status: Abnormal   Collection Time: 07/09/15  9:37 PM  Result Value Ref Range   pH, Arterial 7.352 7.350 - 7.450   pCO2 arterial 44.3 35.0 - 45.0 mmHg   pO2, Arterial 88.0 80.0 - 100.0 mmHg   Bicarbonate 24.6 (H) 20.0 - 24.0 mEq/L   TCO2 26 0 - 100 mmol/L   O2 Saturation 96.0 %   Acid-base deficit 1.0 0.0 - 2.0 mmol/L   Patient temperature 36.9 C    Sample type ARTERIAL   Glucose, capillary  Status: Abnormal   Collection Time: 07/09/15 10:04 PM  Result Value Ref Range   Glucose-Capillary 156 (H) 65 - 99  mg/dL  I-STAT, chem 8     Status: Abnormal   Collection Time: 07/09/15 10:12 PM  Result Value Ref Range   Sodium 141 135 - 145 mmol/L   Potassium 4.2 3.5 - 5.1 mmol/L   Chloride 105 101 - 111 mmol/L   BUN 15 6 - 20 mg/dL   Creatinine, Ser 0.80 0.61 - 1.24 mg/dL   Glucose, Bld 153 (H) 65 - 99 mg/dL   Calcium, Ion 1.16 1.12 - 1.23 mmol/L   TCO2 24 0 - 100 mmol/L   Hemoglobin 10.5 (L) 13.0 - 17.0 g/dL   HCT 31.0 (L) 39.0 - 52.0 %  CBC     Status: Abnormal   Collection Time: 07/09/15 10:17 PM  Result Value Ref Range   WBC 12.2 (H) 4.0 - 10.5 K/uL   RBC 3.38 (L) 4.22 - 5.81 MIL/uL   Hemoglobin 10.4 (L) 13.0 - 17.0 g/dL   HCT 30.1 (L) 39.0 - 52.0 %   MCV 89.1 78.0 - 100.0 fL   MCH 30.8 26.0 - 34.0 pg   MCHC 34.6 30.0 - 36.0 g/dL   RDW 12.3 11.5 - 15.5 %   Platelets 128 (L) 150 - 400 K/uL  Magnesium     Status: Abnormal   Collection Time: 07/09/15 10:17 PM  Result Value Ref Range   Magnesium 2.5 (H) 1.7 - 2.4 mg/dL  Creatinine, serum     Status: None   Collection Time: 07/09/15 10:17 PM  Result Value Ref Range   Creatinine, Ser 0.90 0.61 - 1.24 mg/dL   GFR calc non Af Amer >60 >60 mL/min   GFR calc Af Amer >60 >60 mL/min    Comment: (NOTE) The eGFR has been calculated using the CKD EPI equation. This calculation has not been validated in all clinical situations. eGFR's persistently <60 mL/min signify possible Chronic Kidney Disease.   Glucose, capillary     Status: Abnormal   Collection Time: 07/09/15 11:08 PM  Result Value Ref Range   Glucose-Capillary 155 (H) 65 - 99 mg/dL  Glucose, capillary     Status: Abnormal   Collection Time: 07/10/15 12:03 AM  Result Value Ref Range   Glucose-Capillary 158 (H) 65 - 99 mg/dL  Glucose, capillary     Status: Abnormal   Collection Time: 07/10/15  1:02 AM  Result Value Ref Range   Glucose-Capillary 131 (H) 65 - 99 mg/dL  Glucose, capillary     Status: Abnormal   Collection Time: 07/10/15  2:12 AM  Result Value Ref Range    Glucose-Capillary 120 (H) 65 - 99 mg/dL  Glucose, capillary     Status: Abnormal   Collection Time: 07/10/15  3:07 AM  Result Value Ref Range   Glucose-Capillary 120 (H) 65 - 99 mg/dL  Glucose, capillary     Status: Abnormal   Collection Time: 07/10/15  4:01 AM  Result Value Ref Range   Glucose-Capillary 103 (H) 65 - 99 mg/dL  CBC     Status: Abnormal   Collection Time: 07/10/15  4:25 AM  Result Value Ref Range   WBC 12.8 (H) 4.0 - 10.5 K/uL   RBC 3.20 (L) 4.22 - 5.81 MIL/uL   Hemoglobin 10.1 (L) 13.0 - 17.0 g/dL   HCT 28.6 (L) 39.0 - 52.0 %   MCV 89.4 78.0 - 100.0 fL   MCH 31.6 26.0 - 34.0 pg  MCHC 35.3 30.0 - 36.0 g/dL   RDW 12.5 11.5 - 15.5 %   Platelets 133 (L) 150 - 400 K/uL  Basic metabolic panel     Status: Abnormal   Collection Time: 07/10/15  4:25 AM  Result Value Ref Range   Sodium 139 135 - 145 mmol/L   Potassium 4.3 3.5 - 5.1 mmol/L   Chloride 110 101 - 111 mmol/L   CO2 25 22 - 32 mmol/L   Glucose, Bld 118 (H) 65 - 99 mg/dL   BUN 16 6 - 20 mg/dL   Creatinine, Ser 1.05 0.61 - 1.24 mg/dL   Calcium 7.7 (L) 8.9 - 10.3 mg/dL   GFR calc non Af Amer >60 >60 mL/min   GFR calc Af Amer >60 >60 mL/min    Comment: (NOTE) The eGFR has been calculated using the CKD EPI equation. This calculation has not been validated in all clinical situations. eGFR's persistently <60 mL/min signify possible Chronic Kidney Disease.    Anion gap 4 (L) 5 - 15  Magnesium     Status: None   Collection Time: 07/10/15  4:25 AM  Result Value Ref Range   Magnesium 2.4 1.7 - 2.4 mg/dL  Glucose, capillary     Status: Abnormal   Collection Time: 07/10/15  5:14 AM  Result Value Ref Range   Glucose-Capillary 116 (H) 65 - 99 mg/dL  Glucose, capillary     Status: Abnormal   Collection Time: 07/10/15  5:55 AM  Result Value Ref Range   Glucose-Capillary 114 (H) 65 - 99 mg/dL    Imaging: Imaging results have been reviewed  Tele- NSR  Assessment/Plan:   1. Active Problems: 2.   S/P  AVR 3.   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 Looks good. VSS. Dop being weaned. Exam benign. Labs OK. I/O neg.  CTs coming out. Nl progression per TCTS. Apprec Dr Hendrickson's care!!! Will see again on 2W   Quay Burow 07/10/2015, 9:29 AM

## 2015-07-10 NOTE — Progress Notes (Signed)
CT surgery p.m. Rounds  Patient had stable day following aVR CABG yesterday Blood sugars well-controlled on insulin infusion We'll transition to the patient's own insulin pump with pharmacy help tomorrow P.m. labs show mild expected anemia, hemodynamic stable

## 2015-07-10 NOTE — Progress Notes (Signed)
RT NOTE:  Pt responded well to Racemic Epi nebulizer. He stated he wants to try CPAP again. Pt set to Auto CPAP, tolerating well at this time. RT will monitor.

## 2015-07-11 ENCOUNTER — Inpatient Hospital Stay (HOSPITAL_COMMUNITY): Payer: PRIVATE HEALTH INSURANCE

## 2015-07-11 LAB — GLUCOSE, CAPILLARY
GLUCOSE-CAPILLARY: 250 mg/dL — AB (ref 65–99)
GLUCOSE-CAPILLARY: 218 mg/dL — AB (ref 65–99)
GLUCOSE-CAPILLARY: 209 mg/dL — AB (ref 65–99)
GLUCOSE-CAPILLARY: 193 mg/dL — AB (ref 65–99)
Glucose-Capillary: 108 mg/dL — ABNORMAL HIGH (ref 65–99)
Glucose-Capillary: 115 mg/dL — ABNORMAL HIGH (ref 65–99)
Glucose-Capillary: 189 mg/dL — ABNORMAL HIGH (ref 65–99)
Glucose-Capillary: 202 mg/dL — ABNORMAL HIGH (ref 65–99)
Glucose-Capillary: 214 mg/dL — ABNORMAL HIGH (ref 65–99)
Glucose-Capillary: 238 mg/dL — ABNORMAL HIGH (ref 65–99)
Glucose-Capillary: 273 mg/dL — ABNORMAL HIGH (ref 65–99)
Glucose-Capillary: 278 mg/dL — ABNORMAL HIGH (ref 65–99)
Glucose-Capillary: 281 mg/dL — ABNORMAL HIGH (ref 65–99)

## 2015-07-11 LAB — BASIC METABOLIC PANEL
ANION GAP: 6 (ref 5–15)
BUN: 25 mg/dL — AB (ref 6–20)
CALCIUM: 7.8 mg/dL — AB (ref 8.9–10.3)
CO2: 26 mmol/L (ref 22–32)
CREATININE: 1.37 mg/dL — AB (ref 0.61–1.24)
Chloride: 105 mmol/L (ref 101–111)
GFR calc Af Amer: >60 mL/min (ref >60)
GFR, EST NON AFRICAN AMERICAN: 52 mL/min — AB (ref >60)
GLUCOSE: 239 mg/dL — AB (ref 65–99)
Potassium: 4.7 mmol/L (ref 3.5–5.1)
SODIUM: 137 mmol/L (ref 135–145)

## 2015-07-11 LAB — CBC
HCT: 27.5 % — ABNORMAL LOW (ref 39.0–52.0)
HEMOGLOBIN: 9 g/dL — AB (ref 13.0–17.0)
MCH: 30.6 pg (ref 26.0–34.0)
MCHC: 32.7 g/dL (ref 30.0–36.0)
MCV: 93.5 fL (ref 78.0–100.0)
Platelets: 91 10*3/uL — ABNORMAL LOW (ref 150–400)
RBC: 2.94 MIL/uL — ABNORMAL LOW (ref 4.22–5.81)
RDW: 13 % (ref 11.5–15.5)
WBC: 12.9 10*3/uL — ABNORMAL HIGH (ref 4.0–10.5)

## 2015-07-11 MED ORDER — INSULIN PUMP
SUBCUTANEOUS | Status: DC
  Administered 2015-07-11: 0.925 via SUBCUTANEOUS
  Administered 2015-07-11: 3.15 via SUBCUTANEOUS
  Administered 2015-07-11: 3.5 via SUBCUTANEOUS
  Administered 2015-07-12: 1 via SUBCUTANEOUS
  Administered 2015-07-12: 0.7 via SUBCUTANEOUS
  Administered 2015-07-12: 5.5 via SUBCUTANEOUS
  Administered 2015-07-12: 3.5 via SUBCUTANEOUS
  Administered 2015-07-12: 6 via SUBCUTANEOUS
  Administered 2015-07-12: 3.25 via SUBCUTANEOUS
  Administered 2015-07-13 (×2): 6.25 via SUBCUTANEOUS
  Administered 2015-07-13: 1 via SUBCUTANEOUS
  Administered 2015-07-13: 1.75 via SUBCUTANEOUS
  Administered 2015-07-14: 6.5 via SUBCUTANEOUS
  Administered 2015-07-14: 01:00:00 via SUBCUTANEOUS
  Administered 2015-07-14: 13.25 via SUBCUTANEOUS
  Filled 2015-07-11: qty 1

## 2015-07-11 MED ORDER — INSULIN GLARGINE 100 UNIT/ML ~~LOC~~ SOLN
8.0000 [IU] | Freq: Every day | SUBCUTANEOUS | Status: DC
  Filled 2015-07-11: qty 0.08

## 2015-07-11 MED ORDER — FUROSEMIDE 10 MG/ML IJ SOLN
40.0000 mg | Freq: Once | INTRAMUSCULAR | Status: AC
  Administered 2015-07-11: 40 mg via INTRAVENOUS

## 2015-07-11 MED ORDER — INSULIN ASPART 100 UNIT/ML ~~LOC~~ SOLN: 0.0000 [IU] | Freq: Three times a day (TID) | SUBCUTANEOUS | Status: DC

## 2015-07-11 MED ORDER — METOCLOPRAMIDE HCL 5 MG/ML IJ SOLN
10.0000 mg | Freq: Four times a day (QID) | INTRAMUSCULAR | Status: DC
  Administered 2015-07-11 – 2015-07-13 (×7): 10 mg via INTRAVENOUS
  Filled 2015-07-11 (×9): qty 2

## 2015-07-11 MED ORDER — FUROSEMIDE 10 MG/ML IJ SOLN
INTRAMUSCULAR | Status: AC
  Administered 2015-07-11: 40 mg via INTRAVENOUS
  Filled 2015-07-11: qty 4

## 2015-07-11 MED ORDER — INSULIN ASPART 100 UNIT/ML ~~LOC~~ SOLN: 0.0000 [IU] | Freq: Every day | SUBCUTANEOUS | Status: DC

## 2015-07-11 NOTE — Progress Notes (Signed)
2 Days Post-Op Procedure(s) (LRB): AORTIC VALVE REPLACEMENT (AVR) (N/A) CORONARY ARTERY BYPASS GRAFTING (CABG)TIMES THREE USING LEFT INTERNAL MAMMARY ARTERY AND LEFT SAPHENOUS LEG VEIN HARVESTED ENDOSCOPICALLY (N/A) INTRAOPERATIVE TRANSESOPHAGEAL ECHOCARDIOGRAM (N/A) Subjective: Feels poorly because of nausea and fluctuations in blood sugar Insulin IV infusion through peripheral IV was discontinued last p.m. Plan on transitioning to his own insulin pump today with which she is familiar after having used for several years Remains in sinus rhythm Weak and requires assistance getting out of bed  Objective: Vital signs in last 24 hours: Temp:  [97.6 F (36.4 C)-98.4 F (36.9 C)] 98.4 F (36.9 C) (07/08 1127) Pulse Rate:  [78-89] 81 (07/08 1200) Cardiac Rhythm:  [-] Normal sinus rhythm (07/08 1200) Resp:  [10-27] 20 (07/08 1200) BP: (90-130)/(46-70) 130/67 mmHg (07/08 1200) SpO2:  [91 %-100 %] 99 % (07/08 1200) Arterial Line BP: (150-155)/(50-57) 155/57 mmHg (07/07 1420) Weight:  [225 lb 8.5 oz (102.3 kg)] 225 lb 8.5 oz (102.3 kg) (07/08 0600)  Hemodynamic parameters for last 24 hours:  stable  Intake/Output from previous day: 07/07 0701 - 07/08 0700 In: 893.9 [P.O.:480; I.V.:363.9; IV Piggyback:50] Out: 1780 [Urine:1540; Chest Tube:240] Intake/Output this shift: Total I/O In: 113.2 [P.O.:50; I.V.:63.2] Out: -       Physical Exam      Exam    General- alert and comfortable   Lungs- clear without rales, wheezes   Cor- regular rate and rhythm, no murmur , gallop   Abdomen- soft, non-tender   Extremities - warm, non-tender, minimal edema   Neuro- oriented, appropriate, no focal weakness    Recent Labs  07/10/15 1700 07/10/15 1721 07/11/15 0352  WBC 13.1*  --  12.9*  HGB 8.4* 8.8* 9.0*  HCT 24.3* 26.0* 27.5*  PLT 94*  --  91*   BMET:  Recent Labs  07/10/15 0425  07/10/15 1721 07/11/15 0352  NA 139  --  139 137  K 4.3  --  4.0 4.7  CL 110  --  102 105  CO2  25  --   --  26  GLUCOSE 118*  --  161* 239*  BUN 16  --  18 25*  CREATININE 1.05  < > 0.90 1.37*  CALCIUM 7.7*  --   --  7.8*  < > = values in this interval not displayed.  PT/INR:  Recent Labs  07/09/15 1553  LABPROT 18.2*  INR 1.50*   ABG    Component Value Date/Time   PHART 7.352 07/09/2015 2137   HCO3 24.6* 07/09/2015 2137   TCO2 26 07/10/2015 1721   ACIDBASEDEF 1.0 07/09/2015 2137   O2SAT 96.0 07/09/2015 2137   CBG (last 3)   Recent Labs  07/10/15 2232 07/10/15 2334 07/11/15 0335  GLUCAP 108* 115* 202*    Assessment/Plan: S/P Procedure(s) (LRB): AORTIC VALVE REPLACEMENT (AVR) (N/A) CORONARY ARTERY BYPASS GRAFTING (CABG)TIMES THREE USING LEFT INTERNAL MAMMARY ARTERY AND LEFT SAPHENOUS LEG VEIN HARVESTED ENDOSCOPICALLY (N/A) INTRAOPERATIVE TRANSESOPHAGEAL ECHOCARDIOGRAM (N/A) Poorly controlled diabetes-will transition to his own insulin pump and use his own schedule Resume Reglan for nausea and hypoactive bowel activity Keep in ICU today  LOS: 2 days    Kathlee Nationseter Van Trigt III 07/11/2015

## 2015-07-11 NOTE — Progress Notes (Signed)
Pt applied insulin pump at this time. Pt agrees with hospital policy of using hospital meter to check CBG. Pt is alert and oriented and able to manage pt on his own.

## 2015-07-11 NOTE — Progress Notes (Signed)
Patient wanted his insulin drip stopped overnight and not to check his sugar every hour. Patient wanted to sleep and was very concerned about hypoglycemia during the night. Explained the patient about the safety of insulin drip but was adamant to stop it. Insulin drip was stopped at 2233. Blood sugars were low 100's. Oxycodone given once for pain before sleep and zofran at the end of the shift for nausea.

## 2015-07-12 ENCOUNTER — Inpatient Hospital Stay (HOSPITAL_COMMUNITY): Payer: PRIVATE HEALTH INSURANCE

## 2015-07-12 LAB — GLUCOSE, CAPILLARY
GLUCOSE-CAPILLARY: 283 mg/dL — AB (ref 65–99)
GLUCOSE-CAPILLARY: 169 mg/dL — AB (ref 65–99)
GLUCOSE-CAPILLARY: 181 mg/dL — AB (ref 65–99)
GLUCOSE-CAPILLARY: 283 mg/dL — AB (ref 65–99)
GLUCOSE-CAPILLARY: 283 mg/dL — AB (ref 65–99)
Glucose-Capillary: 240 mg/dL — ABNORMAL HIGH (ref 65–99)
Glucose-Capillary: 283 mg/dL — ABNORMAL HIGH (ref 65–99)
Glucose-Capillary: 216 mg/dL — ABNORMAL HIGH (ref 65–99)

## 2015-07-12 LAB — CBC
HCT: 24.6 % — ABNORMAL LOW (ref 39.0–52.0)
Hemoglobin: 8.4 g/dL — ABNORMAL LOW (ref 13.0–17.0)
MCH: 31.6 pg (ref 26.0–34.0)
MCHC: 34.1 g/dL (ref 30.0–36.0)
MCV: 92.5 fL (ref 78.0–100.0)
Platelets: 106 10*3/uL — ABNORMAL LOW (ref 150–400)
RBC: 2.66 MIL/uL — ABNORMAL LOW (ref 4.22–5.81)
RDW: 12.7 % (ref 11.5–15.5)
WBC: 13.4 10*3/uL — ABNORMAL HIGH (ref 4.0–10.5)

## 2015-07-12 LAB — BASIC METABOLIC PANEL
Anion gap: 4 — ABNORMAL LOW (ref 5–15)
BUN: 34 mg/dL — ABNORMAL HIGH (ref 6–20)
CO2: 28 mmol/L (ref 22–32)
Calcium: 7.7 mg/dL — ABNORMAL LOW (ref 8.9–10.3)
Chloride: 102 mmol/L (ref 101–111)
Creatinine, Ser: 1.18 mg/dL (ref 0.61–1.24)
GFR calc Af Amer: >60 mL/min (ref >60)
GFR calc non Af Amer: >60 mL/min (ref >60)
Glucose, Bld: 188 mg/dL — ABNORMAL HIGH (ref 65–99)
Potassium: 3.8 mmol/L (ref 3.5–5.1)
Sodium: 134 mmol/L — ABNORMAL LOW (ref 135–145)

## 2015-07-12 MED ORDER — MAGNESIUM HYDROXIDE 400 MG/5ML PO SUSP: 30.0000 mL | Freq: Every day | ORAL | Status: DC | PRN

## 2015-07-12 MED ORDER — FE FUMARATE-B12-VIT C-FA-IFC PO CAPS
1.0000 | ORAL_CAPSULE | Freq: Three times a day (TID) | ORAL | Status: DC
  Administered 2015-07-12 – 2015-07-14 (×5): 1 via ORAL
  Filled 2015-07-12 (×5): qty 1

## 2015-07-12 MED ORDER — SODIUM CHLORIDE 0.9% FLUSH
3.0000 mL | Freq: Two times a day (BID) | INTRAVENOUS | Status: DC
  Administered 2015-07-12 – 2015-07-13 (×2): 3 mL via INTRAVENOUS

## 2015-07-12 MED ORDER — FUROSEMIDE 10 MG/ML IJ SOLN
40.0000 mg | Freq: Once | INTRAMUSCULAR | Status: AC
  Administered 2015-07-12: 40 mg via INTRAVENOUS
  Filled 2015-07-12: qty 4

## 2015-07-12 MED ORDER — FUROSEMIDE 40 MG PO TABS
40.0000 mg | ORAL_TABLET | Freq: Every day | ORAL | Status: DC
  Administered 2015-07-13 – 2015-07-14 (×2): 40 mg via ORAL
  Filled 2015-07-12 (×2): qty 1

## 2015-07-12 MED ORDER — SODIUM CHLORIDE 0.9% FLUSH: 3.0000 mL | INTRAVENOUS | Status: DC | PRN

## 2015-07-12 MED ORDER — POTASSIUM CHLORIDE CRYS ER 20 MEQ PO TBCR
20.0000 meq | EXTENDED_RELEASE_TABLET | Freq: Every day | ORAL | Status: DC
  Administered 2015-07-13 – 2015-07-14 (×2): 20 meq via ORAL
  Filled 2015-07-12 (×2): qty 1

## 2015-07-12 MED ORDER — MOVING RIGHT ALONG BOOK
Freq: Once | Status: AC
  Administered 2015-07-12: 13:00:00
  Filled 2015-07-12: qty 1

## 2015-07-12 MED ORDER — SODIUM CHLORIDE 0.9 % IV SOLN: 250.0000 mL | INTRAVENOUS | Status: DC | PRN

## 2015-07-12 MED ORDER — GUAIFENESIN ER 600 MG PO TB12: 600.0000 mg | ORAL_TABLET | Freq: Two times a day (BID) | ORAL | Status: DC | PRN

## 2015-07-12 NOTE — Progress Notes (Signed)
Inpatient Diabetes Program Recommendations  AACE/ADA: New Consensus Statement on Inpatient Glycemic Control (2015)  Target Ranges:  Prepandial:   less than 140 mg/dL      Peak postprandial:   less than 180 mg/dL (1-2 hours)      Critically ill patients:  140 - 180 mg/dL   Lab Results  Component Value Date   GLUCAP 283* 07/12/2015   HGBA1C 8.4* 07/06/2015    Review of Glycemic Control  Pt is using insulin pump in the hospital. Insulin Pump order set in place. RN paged to alert Diabetes Coordinator of pump. To be transferred to 2W. Has been given pump contract to sign and flowsheet to record insulin boluses. Has had DM1 x 60 years. RN states blood sugars have been running a little high, but pt prefers this to prevent hypoglycemia. States he drops quickly. Eating 25-75%. CBGs 169 mg/dL - 161283 mg/dL. Pt is on an insulin pump: One Touch Ping ([employer]) with NovoLog. He got a replacement pump in 10/2014.  Pump settings:  Per Dr. Elvera LennoxGherghe on 03/09/2015 - basal rate:  MN: 0.55 2 am: 0.75 7 am: 1.1 11 am: 1.1 5 PM: 0.85 11pm 0.65 - ICR:  12 am: 15 6 am: 12 >> 10 (did not change this as advised at last visit...) 10 am 12 4 pm: 10 - ISF: MN: 30  11 am: 25 9 pm: 40 - target:  MN: 140 5:30 am: 110  10 pm: 140 - IOB: 4 h  TDD bolus ave 45% (16.8 units) TDD basal ave 55% (20.4 units)  Diabetes Coordinator to f/u in am.  Thank you. Ailene Ardshonda Yona Kosek, RD, LDN, CDE Inpatient Diabetes Coordinator [phone]

## 2015-07-12 NOTE — Progress Notes (Signed)
Pt stated he felt like CPAP machine was too strong and too loud. He stated he did not feel like he could tolerate it tonight.

## 2015-07-12 NOTE — Progress Notes (Signed)
3 Days Post-Op Procedure(s) (LRB): AORTIC VALVE REPLACEMENT (AVR) (N/A) CORONARY ARTERY BYPASS GRAFTING (CABG)TIMES THREE USING LEFT INTERNAL MAMMARY ARTERY AND LEFT SAPHENOUS LEG VEIN HARVESTED ENDOSCOPICALLY (N/A) INTRAOPERATIVE TRANSESOPHAGEAL ECHOCARDIOGRAM (N/A) Subjective: Patient transition to his own insulin pump with adequate glucose control Maintaining sinus rhythm Weight is up--Lasix has been ordered Patient ready for transfer to stepdown, orders in place Chest x-ray with minimal atelectasis  Objective: Vital signs in last 24 hours: Temp:  [97.8 F (36.6 C)-98.7 F (37.1 C)] 97.8 F (36.6 C) (07/09 1159) Pulse Rate:  [80-90] 86 (07/09 1000) Cardiac Rhythm:  [-] Normal sinus rhythm (07/09 1000) Resp:  [10-22] 19 (07/09 1000) BP: (96-131)/(51-62) 116/58 mmHg (07/09 1000) SpO2:  [87 %-100 %] 100 % (07/09 1000) Weight:  [227 lb 1.2 oz (103 kg)] 227 lb 1.2 oz (103 kg) (07/09 0500)  Hemodynamic parameters for last 24 hours:    Intake/Output from previous day: 07/08 0701 - 07/09 0700 In: 784.6 [P.O.:600; I.V.:84.6; IV Piggyback:100] Out: 900 [Urine:900] Intake/Output this shift: Total I/O In: 240 [P.O.:240] Out: -   Sinus rhythm Mild edema Lungs clear  Lab Results:  Recent Labs  07/11/15 0352 07/12/15 0338  WBC 12.9* 13.4*  HGB 9.0* 8.4*  HCT 27.5* 24.6*  PLT 91* 106*   BMET:  Recent Labs  07/11/15 0352 07/12/15 0338  NA 137 134*  K 4.7 3.8  CL 105 102  CO2 26 28  GLUCOSE 239* 188*  BUN 25* 34*  CREATININE 1.37* 1.18  CALCIUM 7.8* 7.7*    PT/INR:  Recent Labs  07/09/15 1553  LABPROT 18.2*  INR 1.50*   ABG    Component Value Date/Time   PHART 7.352 07/09/2015 2137   HCO3 24.6* 07/09/2015 2137   TCO2 26 07/10/2015 1721   ACIDBASEDEF 1.0 07/09/2015 2137   O2SAT 96.0 07/09/2015 2137   CBG (last 3)   Recent Labs  07/12/15 0502 07/12/15 0725 07/12/15 1158  GLUCAP 169* 181* 283*    Assessment/Plan: S/P Procedure(s)  (LRB): AORTIC VALVE REPLACEMENT (AVR) (N/A) CORONARY ARTERY BYPASS GRAFTING (CABG)TIMES THREE USING LEFT INTERNAL MAMMARY ARTERY AND LEFT SAPHENOUS LEG VEIN HARVESTED ENDOSCOPICALLY (N/A) INTRAOPERATIVE TRANSESOPHAGEAL ECHOCARDIOGRAM (N/A) Diuresis See progression orders Oral iron for expected postoperative anemia   LOS: 3 days    Kathlee Nationseter Van Trigt III 07/12/2015

## 2015-07-12 NOTE — Progress Notes (Signed)
Diabetes coordinator paged to make aware of pt moving to 2west and on home insulin pump

## 2015-07-13 ENCOUNTER — Telehealth: Payer: Self-pay | Admitting: Cardiovascular Disease

## 2015-07-13 ENCOUNTER — Inpatient Hospital Stay (HOSPITAL_COMMUNITY): Payer: PRIVATE HEALTH INSURANCE

## 2015-07-13 DIAGNOSIS — Z736 Limitation of activities due to disability: Secondary | ICD-10-CM

## 2015-07-13 LAB — BASIC METABOLIC PANEL
Anion gap: 8 (ref 5–15)
BUN: 29 mg/dL — ABNORMAL HIGH (ref 6–20)
CO2: 27 mmol/L (ref 22–32)
Calcium: 7.9 mg/dL — ABNORMAL LOW (ref 8.9–10.3)
Chloride: 100 mmol/L — ABNORMAL LOW (ref 101–111)
Creatinine, Ser: 1.06 mg/dL (ref 0.61–1.24)
GFR calc Af Amer: >60 mL/min (ref >60)
GFR calc non Af Amer: >60 mL/min (ref >60)
Glucose, Bld: 221 mg/dL — ABNORMAL HIGH (ref 65–99)
Potassium: 4.2 mmol/L (ref 3.5–5.1)
Sodium: 135 mmol/L (ref 135–145)

## 2015-07-13 LAB — CBC
HCT: 24.7 % — ABNORMAL LOW (ref 39.0–52.0)
Hemoglobin: 8.5 g/dL — ABNORMAL LOW (ref 13.0–17.0)
MCH: 31.5 pg (ref 26.0–34.0)
MCHC: 34.4 g/dL (ref 30.0–36.0)
MCV: 91.5 fL (ref 78.0–100.0)
Platelets: 125 10*3/uL — ABNORMAL LOW (ref 150–400)
RBC: 2.7 MIL/uL — ABNORMAL LOW (ref 4.22–5.81)
RDW: 12.7 % (ref 11.5–15.5)
WBC: 10.4 10*3/uL (ref 4.0–10.5)

## 2015-07-13 LAB — GLUCOSE, CAPILLARY
GLUCOSE-CAPILLARY: 250 mg/dL — AB (ref 65–99)
GLUCOSE-CAPILLARY: 288 mg/dL — AB (ref 65–99)
GLUCOSE-CAPILLARY: 344 mg/dL — AB (ref 65–99)
Glucose-Capillary: 226 mg/dL — ABNORMAL HIGH (ref 65–99)
Glucose-Capillary: 223 mg/dL — ABNORMAL HIGH (ref 65–99)

## 2015-07-13 MED ORDER — METOPROLOL TARTRATE 25 MG PO TABS
25.0000 mg | ORAL_TABLET | Freq: Two times a day (BID) | ORAL | Status: DC
  Administered 2015-07-13 – 2015-07-14 (×2): 25 mg via ORAL
  Filled 2015-07-13 (×3): qty 1

## 2015-07-13 NOTE — Progress Notes (Signed)
Patient sitting up in chair, no needs at this time. Declined Reglan, call light within reach.

## 2015-07-13 NOTE — Progress Notes (Addendum)
Inpatient Diabetes Program Recommendations  AACE/ADA: New Consensus Statement on Inpatient Glycemic Control (2015)  Target Ranges:  Prepandial:   less than 140 mg/dL      Peak postprandial:   less than 180 mg/dL (1-2 hours)      Critically ill patients:  140 - 180 mg/dL   Lab Results  Component Value Date   GLUCAP 250* 07/13/2015   HGBA1C 8.4* 07/06/2015   Attempted to see patient regarding insulin pump use.  He was sleeping however wife at bedside and she states she brought him a new meter from home due to discrepancies between his meter and the hospitals meter.  Explained that there can be up to a 15% variance between meters, however wife states it was 100 point off.  She states that patient has his pump supplies at the bedside and is due to change his site tomorrow.  Will follow.  Note that blood sugars greater than hospital goal.  May consider calling patient's outpatient endocrinologist for follow-up and possible recommendations for insulin pump changes.   Thanks, Beryl MeagerJenny Promiss Labarbera, RN, BC-ADM Inpatient Diabetes Coordinator Pager [phone] (8a-5p)

## 2015-07-13 NOTE — Progress Notes (Signed)
CARDIAC REHAB PHASE I   PRE:  Rate/Rhythm: 91 SR  BP:  Supine:   Sitting: 131/57  Standing:    SaO2: 97% 2L, 92%RA  MODE:  Ambulation: 290 ft   POST:  Rate/Rhythm: 111 ST  BP:  Supine:   Sitting: 143/67  Standing:    SaO2: 91%RA 0840-0912 Pt walked 290 ft on RA with rolling walker and asst x 1. To bathroom after walk. Pt stated he wanted to cut walk short as he was not feeling as well today as he did yesterday. Assisted back to recliner after bathroom. Sats at 91%RA so left off oxygen and encouraged IS. Encouraged two more walks with staff later.   Luetta Nuttingharlene Lendora Keys, RN BSN  07/13/2015 9:08 AM

## 2015-07-13 NOTE — Progress Notes (Addendum)
      [address]       [place] [id number]             [phone]      4 Days Post-Op Procedure(s) (LRB): AORTIC VALVE REPLACEMENT (AVR) (N/A) CORONARY ARTERY BYPASS GRAFTING (CABG)TIMES THREE USING LEFT INTERNAL MAMMARY ARTERY AND LEFT SAPHENOUS LEG VEIN HARVESTED ENDOSCOPICALLY (N/A) INTRAOPERATIVE TRANSESOPHAGEAL ECHOCARDIOGRAM (N/A)   Subjective:  [name] states he is doing okay, has no complaints.  + ambulation  + BM  Objective: Vital signs in last 24 hours: Temp:  [97.8 F (36.6 C)-98.4 F (36.9 C)] 98.2 F (36.8 C) (07/10 0608) Pulse Rate:  [78-92] 92 (07/10 0608) Cardiac Rhythm:  [-] Normal sinus rhythm (07/10 0727) Resp:  [13-22] 16 (07/10 0608) BP: (98-146)/(56-77) 146/67 mmHg (07/10 0608) SpO2:  [94 %-100 %] 94 % (07/10 0608) Weight:  [226 lb 1.6 oz (102.558 kg)] 226 lb 1.6 oz (102.558 kg) (07/10 [id number])  Intake/Output from previous day: 07/09 0701 - 07/10 0700 In: 440 [P.O.:440] Out: 1725 [Urine:1725]  General appearance: alert, cooperative and no distress Heart: regular rate and rhythm Lungs: diminished bibasilar  Abdomen: soft, non-tender; bowel sounds normal; no masses,  no organomegaly Extremities: edema trace Wound: clean and dry, ecchymosis LLE EVH site  Lab Results:  Recent Labs  07/12/15 0338 07/13/15 0312  WBC 13.4* 10.4  HGB 8.4* 8.5*  HCT 24.6* 24.7*  PLT 106* 125*   BMET:  Recent Labs  07/12/15 0338 07/13/15 0312  NA 134* 135  K 3.8 4.2  CL 102 100*  CO2 28 27  GLUCOSE 188* 221*  BUN 34* 29*  CREATININE 1.18 1.06  CALCIUM 7.7* 7.9*    PT/INR: No results for input(s): LABPROT, INR in the last 72 hours. ABG    Component Value Date/Time   PHART 7.352 07/09/2015 2137   HCO3 24.6* 07/09/2015 2137   TCO2 26 07/10/2015 1721   ACIDBASEDEF 1.0 07/09/2015 2137   O2SAT 96.0 07/09/2015 2137   CBG (last 3)   Recent Labs  07/12/15 1906 07/13/15 0213 07/13/15 0604  GLUCAP 216* 226* 223*     Assessment/Plan: S/P Procedure(s) (LRB): AORTIC VALVE REPLACEMENT (AVR) (N/A) CORONARY ARTERY BYPASS GRAFTING (CABG)TIMES THREE USING LEFT INTERNAL MAMMARY ARTERY AND LEFT SAPHENOUS LEG VEIN HARVESTED ENDOSCOPICALLY (N/A) INTRAOPERATIVE TRANSESOPHAGEAL ECHOCARDIOGRAM (N/A)  1. CV- NSR, mildly tachy, hypertensive- will increase Lopressor to 25 mg BID, can hopefully start ACE in AM prior to discharge  2. Pulm- wean oxygen as tolerated, continue IS 3. Renal- creatinine at 1.06, remains hypervolemic, continue Lasix 4. DM- patient back on home insulin pump, sugars elevated at >200 5. dispo- patient stable, will increase Lopressor, d/c EPW today, possibly ready for d/c in AM   LOS: 4 days    BARRETT, ERIN 07/13/2015  Patient seen and examined, agree with above DM control remains an issue as it was preop On ASA and plavix No indication for coumadin with a tissue valve on dual antiplatelet therapy  Viviann SpareSteven C. Dorris FetchHendrickson, MD [facility] [phone]

## 2015-07-14 ENCOUNTER — Other Ambulatory Visit: Payer: Self-pay | Admitting: *Deleted

## 2015-07-14 DIAGNOSIS — I2510 Atherosclerotic heart disease of native coronary artery without angina pectoris: Secondary | ICD-10-CM

## 2015-07-14 DIAGNOSIS — Z951 Presence of aortocoronary bypass graft: Secondary | ICD-10-CM

## 2015-07-14 DIAGNOSIS — D62 Acute posthemorrhagic anemia: Secondary | ICD-10-CM

## 2015-07-14 LAB — GLUCOSE, CAPILLARY
GLUCOSE-CAPILLARY: 255 mg/dL — AB (ref 65–99)
GLUCOSE-CAPILLARY: 358 mg/dL — AB (ref 65–99)

## 2015-07-14 MED ORDER — FERROUS SULFATE 325 (65 FE) MG PO TABS: 325.0000 mg | ORAL_TABLET | Freq: Two times a day (BID) | ORAL | Status: DC

## 2015-07-14 MED ORDER — POTASSIUM CHLORIDE CRYS ER 20 MEQ PO TBCR: 20.0000 meq | EXTENDED_RELEASE_TABLET | Freq: Every day | ORAL | Status: DC

## 2015-07-14 MED ORDER — OXYCODONE HCL 5 MG PO TABS: 5.0000 mg | ORAL_TABLET | Freq: Four times a day (QID) | ORAL | Status: DC | PRN

## 2015-07-14 MED ORDER — LISINOPRIL 10 MG PO TABS: 10.0000 mg | ORAL_TABLET | Freq: Every day | ORAL | Status: DC

## 2015-07-14 MED ORDER — FOLIC ACID 1 MG PO TABS: 1.0000 mg | ORAL_TABLET | Freq: Every day | ORAL | Status: DC

## 2015-07-14 MED ORDER — FE FUMARATE-B12-VIT C-FA-IFC PO CAPS: 1.0000 | ORAL_CAPSULE | Freq: Three times a day (TID) | ORAL | Status: DC

## 2015-07-14 MED ORDER — METOPROLOL TARTRATE 25 MG PO TABS: 25.0000 mg | ORAL_TABLET | Freq: Two times a day (BID) | ORAL | Status: DC

## 2015-07-14 MED ORDER — LISINOPRIL 10 MG PO TABS
10.0000 mg | ORAL_TABLET | Freq: Every day | ORAL | Status: DC
  Administered 2015-07-14: 10 mg via ORAL
  Filled 2015-07-14: qty 1

## 2015-07-14 MED ORDER — FUROSEMIDE 40 MG PO TABS: 40.0000 mg | ORAL_TABLET | Freq: Every day | ORAL | Status: DC

## 2015-07-14 NOTE — Discharge Instructions (Signed)
.Aortic Valve Replacement, Care After Refer to this sheet in the next few weeks. These instructions provide you with information on caring for yourself after your procedure. Your health care provider may also give you specific instructions. Your treatment has been planned according to current medical practices, but problems sometimes occur. Call your health care provider if you have any problems or questions after your procedure. HOME CARE INSTRUCTIONS Endoscopic Saphenous Vein Harvesting, Care After Refer to this sheet in the next few weeks. These instructions provide you with information on caring for yourself after your procedure. Your health care provider may also give you more specific instructions. Your treatment has been planned according to current medical practices, but problems sometimes occur. Call your health care provider if you have any problems or questions after your procedure. HOME CARE INSTRUCTIONS Medicine  Take whatever pain medicine your surgeon prescribes. Follow the directions carefully. Do not take over-the-counter pain medicine unless your surgeon says it is okay. Some pain medicine can cause bleeding problems for several weeks after surgery.  Follow your surgeon's instructions about driving. You will probably not be permitted to drive after heart surgery.  Take any medicines your surgeon prescribes. Any medicines you took before your heart surgery should be checked with your health care provider before you start taking them again. Wound care  If your surgeon has prescribed an elastic bandage or stocking, ask how long you should wear it.  Check the area around your surgical cuts (incisions) whenever your bandages (dressings) are changed. Look for any redness or swelling.  You will need to return to have the stitches (sutures) or staples taken out. Ask your surgeon when to do that.  Ask your surgeon when you can shower or bathe. Activity  Try to keep your legs raised  when you are sitting.  Do any exercises your health care providers have given you. These may include deep breathing exercises, coughing, walking, or other exercises. SEEK MEDICAL CARE IF:  You have any questions about your medicines.  You have more leg pain, especially if your pain medicine stops working.  New or growing bruises develop on your leg.  Your leg swells, feels tight, or becomes red.  You have numbness in your leg. SEEK IMMEDIATE MEDICAL CARE IF:  Your pain gets much worse.  Blood or fluid leaks from any of the incisions.  Your incisions become warm, swollen, or red.  You have chest pain.  You have trouble breathing.  You have a fever.  You have more pain near your leg incision. MAKE SURE YOU:  Understand these instructions.  Will watch your condition.  Will get help right away if you are not doing well or get worse.   This information is not intended to replace advice given to you by your health care provider. Make sure you discuss any questions you have with your health care provider.   Document Released: 09/01/2010 Document Revised: 01/10/2014 Document Reviewed: 09/01/2010 Elsevier Interactive Patient Education [employer] Else[employer] Inc[employer]   Take medicines only as directed by your health care provider.  If your health care provider has prescribed elastic stockings, wear them as directed.  Take frequent naps or rest often throughout the day.  Avoid lifting over 10 lbs (4.5 kg) or pushing or pulling things with your arms for 6-8 weeks or as directed by your health care provider.  Avoid driving or airplane travel for 4-6 weeks after surgery or as directed by your health care provider. If you are riding in a  car for an extended period, stop every 1-2 hours to stretch your legs. Keep a record of your medicines and medical history with you when traveling.  Do not drive or operate heavy machinery while taking pain medicine. (narcotics).  Do not cross your  legs.  Do not use any tobacco products including cigarettes, chewing tobacco, or electronic cigarettes. If you need help quitting, ask your health care provider.  Do not take baths, swim, or use a hot tub until your health care provider approves. Take showers once your health care provider approves. Pat incisions dry. Do not rub incisions with a washcloth or towel.  Avoid climbing stairs and using the handrail to pull yourself up for the first 2-3 weeks after surgery.  Return to work as directed by your health care provider.  Drink enough fluid to keep your urine clear or pale yellow.  Do not strain to have a bowel movement. Eat high-fiber foods if you become constipated. You may also take a medicine to help you have a bowel movement (laxative) as directed by your health care provider.  Resume sexual activity as directed by your health care provider. Men should not use medicines for erectile dysfunction until their doctor says it isokay.  If you had a certain type of heart condition in the past, you may need to take antibiotic medicine before having dental work or surgery. Let your dentist and health care providers know if you had one or more of the following:  Previous endocarditis.  An artificial (prosthetic) heart valve.  Congenital heart disease. SEEK MEDICAL CARE IF:  You develop a skin rash.   You experience sudden changes in your weight.  You have a fever. SEEK IMMEDIATE MEDICAL CARE IF:   You develop chest pain that is not coming from your incision.  You have drainage (pus), redness, swelling, or pain at your incision site.   You develop shortness of breath or have difficulty breathing.   You have increased bleeding from your incision site.   You develop light-headedness.  MAKE SURE YOU:   Understand these directions.  Will watch your condition.  Will get help right away if you are not doing well or get worse.   This information is not intended to  replace advice given to you by your health care provider. Make sure you discuss any questions you have with your health care provider.   Document Released: 07/08/2004 Document Revised: 01/10/2014 Document Reviewed: 10/04/2011 Elsevier Interactive Patient Education 2016 Elsevier Inc. Coronary Artery Bypass Grafting, Care After These instructions give you information on caring for yourself after your procedure. Your doctor may also give you more specific instructions. Call your doctor if you have any problems or questions after your procedure.  HOME CARE  Only take medicine as told by your doctor. Take medicines exactly as told. Do not stop taking medicines or start any new medicines without talking to your doctor first.  Take your pulse as told by your doctor.  Do deep breathing as told by your doctor. Use your breathing device (incentive spirometer), if given, to practice deep breathing several times a day. Support your chest with a pillow or your arms when you take deep breaths or cough.  Keep the area clean, dry, and protected where the surgery cuts (incisions) were made. Remove bandages (dressings) only as told by your doctor. If strips were applied to surgical area, do not take them off. They fall off on their own.  Check the surgery area daily for puffiness (  swelling), redness, or leaking fluid.  If surgery cuts were made in your legs:  Avoid crossing your legs.  Avoid sitting for long periods of time. Change positions every 30 minutes.  Raise your legs when you are sitting. Place them on pillows.  Wear stockings that help keep blood clots from forming in your legs (compression stockings).  Only take sponge baths until your doctor says it is okay to take showers. Pat the surgery area dry. Do not rub the surgery area with a washcloth or towel. Do not bathe, swim, or use a hot tub until your doctor says it is okay.  Eat foods that are high in fiber. These include raw fruits and  vegetables, whole grains, beans, and nuts. Choose lean meats. Avoid canned, processed, and fried foods.  Drink enough fluids to keep your pee (urine) clear or pale yellow.  Weigh yourself every day.  Rest and limit activity as told by your doctor. You may be told to:  Stop any activity if you have chest pain, shortness of breath, changes in heartbeat, or dizziness. Get help right away if this happens.  Move around often for short amounts of time or take short walks as told by your doctor. Gradually become more active. You may need help to strengthen your muscles and build endurance.  Avoid lifting, pushing, or pulling anything heavier than 10 pounds (4.5 kg) for at least 6 weeks after surgery.  Do not drive until your doctor says it is okay.  Ask your doctor when you can go back to work.  Ask your doctor when you can begin sexual activity again.  Follow up with your doctor as told. GET HELP IF:  You have puffiness, redness, more pain, or fluid draining from the incision site.  You have a fever.  You have puffiness in your ankles or legs.  You have pain in your legs.  You gain 2 or more pounds (0.9 kg) a day.  You feel sick to your stomach (nauseous) or throw up (vomit).  You have watery poop (diarrhea). GET HELP RIGHT AWAY IF:  You have chest pain that goes to your jaw or arms.  You have shortness of breath.  You have a fast or irregular heartbeat.  You notice a "clicking" in your breastbone when you move.  You have numbness or weakness in your arms or legs.  You feel dizzy or light-headed. MAKE SURE YOU:  Understand these instructions.  Will watch your condition.  Will get help right away if you are not doing well or get worse.   This information is not intended to replace advice given to you by your health care provider. Make sure you discuss any questions you have with your health care provider.   Document Released: 12/25/2012 Document Reviewed:  12/25/2012 Elsevier Interactive Patient Education [employer]

## 2015-07-14 NOTE — Addendum Note (Signed)
Addendum  created 07/14/15 [id number] by Adair LaundryLynn A Rejina Odle, CRNA   Modules edited: Anesthesia LDA, Lines/Drains/Airways Properties Editor   Lines/Drains/Airways Properties Editor:  Properties of line/drain/airway/wound [REMOVED] Arterial Line Left Radial have been modified.

## 2015-07-14 NOTE — Care Management Note (Addendum)
Case Management Note Donn PieriniKristi Star Resler RN, BSN Unit 2W-Case Manager [phone]  Patient Details  Name: Nathaniel PepperRichard L Rippeon MRN: [id number] Date of Birth: 03/24/1950  Subjective/Objective:   Pt admitted s/p AVR and CABG x3                 Action/Plan: PTA Pt lived at home - anticipate return home- no CM needs noted  Expected Discharge Date:    07/14/15              Expected Discharge Plan:  Home/Self Care  In-House Referral:     Discharge planning Services  CM Consult  Post Acute Care Choice:    Choice offered to:     DME Arranged:    DME Agency:     HH Arranged:    HH Agency:     Status of Service:  Completed, signed off  If discussed at [employer] Length of Stay Meetings, dates discussed:    Additional Comments:  Darrold SpanWebster, Tinsley Lomas Hall, RN 07/14/2015, 12:23 PM

## 2015-07-14 NOTE — Progress Notes (Addendum)
Spoke with patient regarding glucose management.  Discussed glucose goals after surgery less than 180 mg/dL. Patient states that he has not been as aggressive with correcting blood sugars with his insulin pump.  Encouraged patient to check blood sugars more frequently and correct.  Also encouraged pt. To call Dr. Elvera LennoxGherghe if blood sugars consistently greater than 180 mg/dL, for potential insulin pump setting changes.  Patient verbalized understanding. He states that he is due to change his insulin pump site when he gets home. Patient to discharge home today.  Thanks, Beryl MeagerJenny Allen Basista, RN, BC-ADM Inpatient Diabetes Coordinator Pager [phone] (8a-5p)

## 2015-07-14 NOTE — Progress Notes (Signed)
[id number] Pt stated he walked some more yesterday off oxygen and tolerated well. Stated he did not need walker for home.  Education completed with pt who voiced understanding. Encouraged IS. Gave diabetic and heart healthy diets. Discussed carb counting,.  Discussed CRP 2 and will refer to [place] program. Wrote down how to view discharge video to watch when wife arrives. Luetta NuttingCharlene Joyanne Eddinger RN BSN 07/14/2015 10:04 AM

## 2015-07-14 NOTE — Progress Notes (Signed)
[address]       [place] [id number]             [phone]      5 Days Post-Op Procedure(s) (LRB): AORTIC VALVE REPLACEMENT (AVR) (N/A) CORONARY ARTERY BYPASS GRAFTING (CABG)TIMES THREE USING LEFT INTERNAL MAMMARY ARTERY AND LEFT SAPHENOUS LEG VEIN HARVESTED ENDOSCOPICALLY (N/A) INTRAOPERATIVE TRANSESOPHAGEAL ECHOCARDIOGRAM (N/A) Subjective: Feels well  Objective: Vital signs in last 24 hours: Temp:  [98 F (36.7 C)-98.2 F (36.8 C)] 98 F (36.7 C) (07/11 0538) Pulse Rate:  [85-97] 97 (07/11 0538) Cardiac Rhythm:  [-] Normal sinus rhythm (07/10 1919) Resp:  [16-18] 16 (07/11 0538) BP: (126-147)/(63-70) 138/68 mmHg (07/11 0538) SpO2:  [92 %-93 %] 92 % (07/11 0538) Weight:  [224 lb 6.4 oz (101.787 kg)] 224 lb 6.4 oz (101.787 kg) (07/11 0538)  Hemodynamic parameters for last 24 hours:    Intake/Output from previous day: 07/10 0701 - 07/11 0700 In: 720 [P.O.:720] Out: -  Intake/Output this shift: Total I/O In: 240 [P.O.:240] Out: -   General appearance: alert, cooperative and no distress Heart: regular rate and rhythm and no murmur Lungs: clear to auscultation bilaterally Abdomen: benign Extremities: + LE edema Wound: incis healing well  Lab Results:  Recent Labs  07/12/15 0338 07/13/15 0312  WBC 13.4* 10.4  HGB 8.4* 8.5*  HCT 24.6* 24.7*  PLT 106* 125*   BMET:  Recent Labs  07/12/15 0338 07/13/15 0312  NA 134* 135  K 3.8 4.2  CL 102 100*  CO2 28 27  GLUCOSE 188* 221*  BUN 34* 29*  CREATININE 1.18 1.06  CALCIUM 7.7* 7.9*    PT/INR: No results for input(s): LABPROT, INR in the last 72 hours. ABG    Component Value Date/Time   PHART 7.352 07/09/2015 2137   HCO3 24.6* 07/09/2015 2137   TCO2 26 07/10/2015 1721   ACIDBASEDEF 1.0 07/09/2015 2137   O2SAT 96.0 07/09/2015 2137   CBG (last 3)   Recent Labs  07/13/15 1643 07/13/15 2120 07/14/15 0629  GLUCAP 288* 344* 255*    Meds Scheduled Meds: . acetaminophen   1,000 mg Oral Q6H   Or  . acetaminophen (TYLENOL) oral liquid 160 mg/5 mL  1,000 mg Per Tube Q6H  . antiseptic oral rinse  7 mL Mouth Rinse q12n4p  . aspirin EC  81 mg Oral Daily   Or  . aspirin  81 mg Per Tube Daily  . bisacodyl  10 mg Oral Daily   Or  . bisacodyl  10 mg Rectal Daily  . chlorhexidine  15 mL Mouth Rinse BID  . clopidogrel  75 mg Oral Daily  . docusate sodium  200 mg Oral Daily  . enoxaparin (LOVENOX) injection  40 mg Subcutaneous QHS  . ferrous fumarate-b12-vitamic C-folic acid  1 capsule Oral TID PC  . furosemide  40 mg Oral Daily  . insulin pump   Subcutaneous Q4H  . levothyroxine  137 mcg Oral Daily  . metoCLOPramide (REGLAN) injection  10 mg Intravenous Q6H  . metoprolol tartrate  25 mg Oral BID  . pantoprazole  40 mg Oral Daily  . potassium chloride  20 mEq Oral Daily  . sodium chloride flush  3 mL Intravenous Q12H  . sodium chloride flush  3 mL Intravenous Q12H   Continuous Infusions: . sodium chloride Stopped (07/10/15 0855)  . sodium chloride Stopped (07/10/15 0600)  . sodium chloride 0 mL/hr at 07/09/15 1600  . lactated ringers  PRN Meds:.sodium chloride, sodium chloride, guaiFENesin, magnesium hydroxide, metoprolol, ondansetron (ZOFRAN) IV, oxyCODONE, sodium chloride flush, sodium chloride flush, traMADol  Xrays Dg Chest 2 View  07/13/2015  CLINICAL DATA:  Status post CABG and aortic valve replacement 4 days ago. EXAM: CHEST  2 VIEW COMPARISON:  Portable chest x-ray of July 12, 2015 FINDINGS: The lungs are well-expanded. Small bilateral pleural effusions are present greater on the left than on the right. There is bibasilar atelectasis. There is no pneumothorax. There is stable apical pleural scarring. The cardiac silhouette remains enlarged. The pulmonary vascularity is normal. The sternal wires are intact. A prosthetic valve is in the aortic position. The mediastinum is normal in width. The retrosternal soft tissues are normal. There is aortic  atherosclerosis. 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 IMPRESSION: 1. Mild improvement in the appearance of the bibasilar atelectasis and bilateral pleural effusions. No pneumothorax or pulmonary interstitial edema. 2. The sternal wires and the retrosternal soft tissues appear normal. Electronically Signed   By: David  [place] M.D.   On: 07/13/2015 07:38    Assessment/Plan: S/P Procedure(s) (LRB): AORTIC VALVE REPLACEMENT (AVR) (N/A) CORONARY ARTERY BYPASS GRAFTING (CABG)TIMES THREE USING LEFT INTERNAL MAMMARY ARTERY AND LEFT SAPHENOUS LEG VEIN HARVESTED ENDOSCOPICALLY (N/A) INTRAOPERATIVE TRANSESOPHAGEAL ECHOCARDIOGRAM (N/A)  1 conts to do well, stable for discharge 2 has insulin pump and is able to adjust titration  LOS: 5 days    Amitai Delaughter E 07/14/2015

## 2015-07-14 NOTE — Discharge Summary (Signed)
Physician Discharge Summary  Patient ID: [name] MRN: [id number] DOB/AGE: 67/06/1950 67 y.o.  Admit date: 07/09/2015 Discharge date: 07/14/2015  Admission Diagnoses:  3 vessel CAD Moderately severe Aortic Stenosis  Discharge Diagnoses:  Active Problems:   S/P AVR   S/P CABG x 3   Patient Active Problem List   Diagnosis Date Noted  . S/P CABG x 3 07/14/2015  . S/P AVR 07/09/2015  . Numbness and tingling of right arm and leg 05/06/2015  . Polypharmacy 04/09/2014  . Carotid occlusion, right 01/16/2014  . Carotid stenosis   . Stroke (HCC)   . TIA (transient ischemic attack) 01/12/2014  . Pre-syncope 05/13/2013  . Essential hypertension 03/18/2013  . Trochanteric bursitis of left hip 01/18/2013  . Preventative health care 01/18/2013  . OSA (obstructive sleep apnea)   . Mild cognitive impairment 05/09/2012  . Poorly controlled type 1 diabetes mellitus with circulatory disorder (HCC) 05/09/2012  . CAD (coronary artery disease) 01/11/2012  . Abnormal nuclear cardiac imaging test, 10/2011 10/28/2011  . Chronic venous insufficiency 09/28/2011  . 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 Aortic stenosis, moderate 09/28/2011  . Carotid stenosis, right 09/28/2011  . Colon cancer screening 04/21/2011  . Fatigue 04/21/2011  . Hyperlipidemia 03/21/2011  . Diabetic peripheral neuropathy (HCC) 01/19/2011  . Restless legs syndrome 01/19/2011  . Hypothyroidism 01/13/2011     HPI: [name] is a 65 year old man with past medical history significant for type 1 diabetes, hypertension, hyperlipidemia, coronary artery disease, aortic stenosis, thyroidectomy with resulting hypothyroidism, carotid stenosis with a previous right internal carotid stent January 2016, peripheral arterial disease, gastroesophageal reflux, venous insufficiency, obstructive sleep apnea for which he uses CPAP at night. He recently saw Dr. Allyson Carter in follow-up. An echocardiogram back in January had shown progression of  his aortic stenosis from moderate to moderately severe with a mean gradient of 32 mmHg (up from 21 mmHg) and a peak gradient of 52 mmHg. His valve area was calculated at 0.9.  He mentioned to Dr. Allyson Carter that he been having shortness of breath with exertion and frequent dizzy spells with exertion. He's been getting short of breath with exertion for a couple of years but it now requires much less exertion and it did previously. He gets short of breath walking 50 feet to his mailbox and with less than one flight of stairs. He does not have any chest pain, pressure, or tightness associated with the shortness of breath. He also has been having dizziness with exertion which has worsened recently as well.  Dr. Allyson Carter recommended cardiac catheterization which was done last week. It showed severe three-vessel coronary disease. His PA pressures were normal. His calculated valve area was 1.09 cm with a mean gradient of 33 mmHg.  He was hospitalized back in May after having a series of TIA events. Initially had right tongue numbness followed by right tongue and chin numbness. He then had an episode where he had right arm and leg numbness. Although symptoms have resolved. He does have some residual weakness in his right hand versus the left from a prior stroke.  The patient was admitted this hospitalization for AVR/CABG   Discharged Condition: good  Hospital Course: The patient was taken to the operating room on 07/09/2015 at which time he underwent the below described procedure. He tolerated it well was taken to the surgical intensive care unit in stable condition.  Postoperative hospital course: Overall the patient has progressed quite nicely. He was weaned from dopamine without difficulty. He has remained  hemodynamically stable in sinus rhythm. All routine lines, monitors and drainage devices have been discontinued in the standard fashion. Diabetes has been under fair control using standard measures with gradual  transition to insulin pump. This will require further outpatient adjustments in the patient is on a titration schedule. Pulmonary status is improving and he is maintaining good oxygen saturations on room air. His 80 moderate volume overload but is responding well to diuretics. He is tolerating gradually increasing activities using standard protocols. He has expected acute blood loss anemia and values have stabilized. He is on iron and folate supplement. Renal function is within normal limits. He is unable to take statins. He is on dual antiplatelet therapy. He is stable for discharge on today's date.  Consults: None  Significant Diagnostic Studies: Routine postoperative laboratory and chest x-ray.  Treatments: surgery:  DATE OF PROCEDURE: 07/09/2015 DATE OF DISCHARGE:   OPERATIVE REPORT   PREOPERATIVE DIAGNOSIS: Three-vessel coronary disease with moderately severe aortic stenosis.  POSTOPERATIVE DIAGNOSIS: Three-vessel coronary disease with moderately severe aortic stenosis.  PROCEDURE: Median sternotomy, extracorporeal circulation, coronary artery bypass grafting x3 (left internal mammary artery to LAD, saphenous vein graft to obtuse marginal 2, saphenous vein graft to posterior descending), aortic valve replacement with 23 mm [facility] Ease bovine pericardial valve (model #3300 [id number], serial [id number]), endoscopic vein harvest, left thigh.  SURGEON: Salvatore Decent. Dorris Fetch, M.D.  ASSISTANT: Rowe Clack, P.A.-C.  ANESTHESIA: General.    Discharge Exam: Blood pressure 138/68, pulse 97, temperature 98 F (36.7 C), temperature source Oral, resp. rate 16, height  (1.88 m), weight 224 lb 6.4 oz (101.787 kg), SpO2 92 %.   General appearance: alert, cooperative and no distress Heart: regular rate and rhythm and no murmur Lungs: clear to auscultation bilaterally Abdomen: benign Extremities: + LE edema Wound: incis healing  well    Disposition: 01-Home or Self Care     Medication List    TAKE these medications        aspirin EC 81 MG tablet  Take 81 mg by mouth daily.     clopidogrel 75 MG tablet  Commonly known as:  PLAVIX  Take 1 tablet (75 mg total) by mouth daily.     clotrimazole-betamethasone cream  Commonly known as:  LOTRISONE  Apply 1 application topically daily as needed (RASH).     ferrous fumarate-b12-vitamic C-folic acid capsule  Commonly known as:  TRINSICON / FOLTRIN  Take 1 capsule by mouth 3 (three) times daily after meals.     FEVERFEW PO  Take 1 capsule by mouth daily. To prevent migraines     furosemide 40 MG tablet  Commonly known as:  LASIX  Take 1 tablet (40 mg total) by mouth daily.     insulin pump Soln  Inject into the skin continuous. Basal rate .95/hr, current pump settings: 12am .7, 2am 1, 7am 1.1, 11am .9, 5pm .9.25, 11pm .85. Uses Novolog Insulin.     levothyroxine 137 MCG tablet  Commonly known as:  SYNTHROID, LEVOTHROID  TAKE ONE TABLET BY MOUTH ONCE DAILY     lisinopril 10 MG tablet  Commonly known as:  PRINIVIL,ZESTRIL  Take 1 tablet (10 mg total) by mouth daily.     metoprolol tartrate 25 MG tablet  Commonly known as:  LOPRESSOR  Take 1 tablet (25 mg total) by mouth 2 (two) times daily.     oxyCODONE 5 MG immediate release tablet  Commonly known as:  Oxy IR/ROXICODONE  Take 1-2 tablets (5-10 mg total)  by mouth every 6 (six) hours as needed for moderate pain, severe pain or breakthrough pain.     potassium chloride SA 20 MEQ tablet  Commonly known as:  K-DUR,KLOR-CON  Take 1 tablet (20 mEq total) by mouth daily.     ranitidine 150 MG tablet  Commonly known as:  ZANTAC  Take 150 mg by mouth at bedtime.     tadalafil 20 MG tablet  Commonly known as:  CIALIS  Take 1 tablet (20 mg total) by mouth daily as needed for erectile dysfunction.       Follow-up Information    Follow up with Loreli Slot, MD.   Specialty:  Cardiothoracic  Surgery   Why:  office will contact you for an appointment to see the surgeon in 4 weeks. Please obtain a chest x-ray at [facility] imaging one half hour prior to this appointment. [facility] is located in the same office complex.   Contact information:   [address] [employer] [address] [place] [address] [phone]       Follow up with Nicolasa Ducking, NP.   Specialties:  Nurse Practitioner, Cardiology, Radiology   Why:  08/06/2015 at 3 PM for cardiology follow-up.   Contact information:   [address] [place] [address] [phone]      The patient has been discharged on:   1.Beta Blocker:  Yes Cove.Etienne   ]                              No   [   ]                              If No, reason:  2.Ace Inhibitor/ARB: Yes Cove.Etienne   ]                                     No  [    ]                                     If No, reason:  3.Statin:   Yes [   ]                  No  [ y  ]                  If No, reason:intollerent to statins  4.Marlowe KaysValentino Hue  Cove.Etienne   ]                  No   [   ]                  If No, reason:  Signed: GOLD,WAYNE E 07/14/2015, 9:32 AM

## 2015-07-14 NOTE — Progress Notes (Signed)
Pt/family given discharge instructions, medication lists, follow up appointments, and when to call the doctor.  Pt/family verbalizes understanding. Pt given signs and symptoms of infection. Removed CT sutures and applied benzoin and 1/2 " steri strips.  Pt tolerated procedure well. Pt given signs and symptoms of infection. Thomas HoffBurton, Dayden Viverette McClintock, RN

## 2015-07-15 ENCOUNTER — Other Ambulatory Visit: Payer: Self-pay | Admitting: *Deleted

## 2015-07-15 DIAGNOSIS — D62 Acute posthemorrhagic anemia: Secondary | ICD-10-CM

## 2015-07-15 DIAGNOSIS — I2510 Atherosclerotic heart disease of native coronary artery without angina pectoris: Secondary | ICD-10-CM

## 2015-07-15 MED ORDER — FERROUS SULFATE 325 (65 FE) MG PO TABS: 325.0000 mg | ORAL_TABLET | Freq: Two times a day (BID) | ORAL | Status: DC

## 2015-07-15 MED ORDER — FOLIC ACID 1 MG PO TABS: 1.0000 mg | ORAL_TABLET | Freq: Every day | ORAL | Status: DC

## 2015-07-15 MED FILL — Dexmedetomidine HCl in NaCl 0.9% IV Soln 400 MCG/100ML: INTRAVENOUS | Qty: 100 | Status: AC

## 2015-07-16 ENCOUNTER — Other Ambulatory Visit: Payer: Self-pay | Admitting: *Deleted

## 2015-07-16 DIAGNOSIS — I2510 Atherosclerotic heart disease of native coronary artery without angina pectoris: Secondary | ICD-10-CM

## 2015-07-16 DIAGNOSIS — D649 Anemia, unspecified: Secondary | ICD-10-CM

## 2015-07-16 MED ORDER — FOLIC ACID 1 MG PO TABS: 1.0000 mg | ORAL_TABLET | Freq: Every day | ORAL | Status: DC

## 2015-07-16 MED ORDER — FERROUS SULFATE 325 (65 FE) MG PO TABS: 325.0000 mg | ORAL_TABLET | Freq: Two times a day (BID) | ORAL | Status: DC

## 2015-07-16 NOTE — Telephone Encounter (Signed)
Closed encounter °

## 2015-07-17 ENCOUNTER — Other Ambulatory Visit: Payer: Self-pay | Admitting: Cardiovascular Disease

## 2015-07-17 NOTE — Telephone Encounter (Signed)
LMOM, we also received denial, will need to re-apply with [employer] per insurance requirements.  Advised that we should hear something next week.

## 2015-07-17 NOTE — Telephone Encounter (Signed)
Insurance deny his Repatha prescription,what can he do to get it approve?

## 2015-07-17 NOTE — Telephone Encounter (Signed)
Patient had lipid clinic appointment 06/16/15 Message routed to clinical pharmacists to assist with Repatha

## 2015-07-20 ENCOUNTER — Other Ambulatory Visit: Payer: Self-pay

## 2015-07-20 ENCOUNTER — Emergency Department (HOSPITAL_COMMUNITY): Payer: PRIVATE HEALTH INSURANCE

## 2015-07-20 ENCOUNTER — Ambulatory Visit (INDEPENDENT_AMBULATORY_CARE_PROVIDER_SITE_OTHER): Payer: Self-pay | Admitting: Physician Assistant

## 2015-07-20 ENCOUNTER — Encounter (HOSPITAL_COMMUNITY): Payer: Self-pay | Admitting: Emergency Medicine

## 2015-07-20 ENCOUNTER — Telehealth: Payer: Self-pay | Admitting: Cardiovascular Disease

## 2015-07-20 ENCOUNTER — Emergency Department (HOSPITAL_COMMUNITY)
Admission: EM | Admit: 2015-07-20 | Discharge: 2015-07-20 | Disposition: A | Discharge status: Home / Self Care | Payer: PRIVATE HEALTH INSURANCE | Attending: Emergency Medicine | Admitting: Emergency Medicine

## 2015-07-20 ENCOUNTER — Ambulatory Visit
Admission: RE | Admit: 2015-07-20 | Discharge: 2015-07-20 | Disposition: A | Discharge status: Home / Self Care | Payer: PRIVATE HEALTH INSURANCE | Source: Ambulatory Visit | Attending: Thoracic Surgery (Cardiothoracic Vascular Surgery) | Admitting: Thoracic Surgery (Cardiothoracic Vascular Surgery)

## 2015-07-20 VITALS — BP 109/61 | HR 84 | Resp 16 | Ht 74.0 in | Wt 218.8 lb

## 2015-07-20 DIAGNOSIS — Z8673 Personal history of transient ischemic attack (TIA), and cerebral infarction without residual deficits: Secondary | ICD-10-CM | POA: Insufficient documentation

## 2015-07-20 DIAGNOSIS — Z951 Presence of aortocoronary bypass graft: Secondary | ICD-10-CM

## 2015-07-20 DIAGNOSIS — I10 Essential (primary) hypertension: Secondary | ICD-10-CM | POA: Diagnosis not present

## 2015-07-20 DIAGNOSIS — Z7982 Long term (current) use of aspirin: Secondary | ICD-10-CM | POA: Insufficient documentation

## 2015-07-20 DIAGNOSIS — E1140 Type 2 diabetes mellitus with diabetic neuropathy, unspecified: Secondary | ICD-10-CM | POA: Insufficient documentation

## 2015-07-20 DIAGNOSIS — R0602 Shortness of breath: Secondary | ICD-10-CM | POA: Insufficient documentation

## 2015-07-20 DIAGNOSIS — E11319 Type 2 diabetes mellitus with unspecified diabetic retinopathy without macular edema: Secondary | ICD-10-CM | POA: Insufficient documentation

## 2015-07-20 DIAGNOSIS — Z79899 Other long term (current) drug therapy: Secondary | ICD-10-CM | POA: Insufficient documentation

## 2015-07-20 DIAGNOSIS — I350 Nonrheumatic aortic (valve) stenosis: Secondary | ICD-10-CM

## 2015-07-20 DIAGNOSIS — M7989 Other specified soft tissue disorders: Secondary | ICD-10-CM | POA: Diagnosis not present

## 2015-07-20 DIAGNOSIS — Z87891 Personal history of nicotine dependence: Secondary | ICD-10-CM | POA: Diagnosis not present

## 2015-07-20 DIAGNOSIS — I2510 Atherosclerotic heart disease of native coronary artery without angina pectoris: Secondary | ICD-10-CM | POA: Insufficient documentation

## 2015-07-20 DIAGNOSIS — Z954 Presence of other heart-valve replacement: Secondary | ICD-10-CM

## 2015-07-20 DIAGNOSIS — Z952 Presence of prosthetic heart valve: Secondary | ICD-10-CM

## 2015-07-20 DIAGNOSIS — Z794 Long term (current) use of insulin: Secondary | ICD-10-CM | POA: Diagnosis not present

## 2015-07-20 DIAGNOSIS — E039 Hypothyroidism, unspecified: Secondary | ICD-10-CM | POA: Diagnosis not present

## 2015-07-20 LAB — BASIC METABOLIC PANEL
Anion gap: 8 (ref 5–15)
BUN: 15 mg/dL (ref 6–20)
CHLORIDE: 99 mmol/L — AB (ref 101–111)
CO2: 28 mmol/L (ref 22–32)
CREATININE: 1.27 mg/dL — AB (ref 0.61–1.24)
Calcium: 9.2 mg/dL (ref 8.9–10.3)
GFR, EST NON AFRICAN AMERICAN: 57 mL/min — AB (ref >60)
Glucose, Bld: 197 mg/dL — ABNORMAL HIGH (ref 65–99)
POTASSIUM: 5.5 mmol/L — AB (ref 3.5–5.1)
SODIUM: 135 mmol/L (ref 135–145)

## 2015-07-20 LAB — CBC
HEMATOCRIT: 31.3 % — AB (ref 39.0–52.0)
Hemoglobin: 10.5 g/dL — ABNORMAL LOW (ref 13.0–17.0)
MCH: 31.3 pg (ref 26.0–34.0)
MCHC: 33.5 g/dL (ref 30.0–36.0)
MCV: 93.2 fL (ref 78.0–100.0)
PLATELETS: 556 10*3/uL — AB (ref 150–400)
RBC: 3.36 MIL/uL — AB (ref 4.22–5.81)
RDW: 13.7 % (ref 11.5–15.5)
WBC: 13.1 10*3/uL — AB (ref 4.0–10.5)

## 2015-07-20 LAB — I-STAT CHEM 8, ED
BUN: 20 mg/dL (ref 6–20)
CALCIUM ION: 1.14 mmol/L (ref 1.12–1.23)
CREATININE: 1.3 mg/dL — AB (ref 0.61–1.24)
Chloride: 98 mmol/L — ABNORMAL LOW (ref 101–111)
Glucose, Bld: 196 mg/dL — ABNORMAL HIGH (ref 65–99)
HCT: 33 % — ABNORMAL LOW (ref 39.0–52.0)
HEMOGLOBIN: 11.2 g/dL — AB (ref 13.0–17.0)
Potassium: 5.5 mmol/L — ABNORMAL HIGH (ref 3.5–5.1)
Sodium: 136 mmol/L (ref 135–145)
TCO2: 29 mmol/L (ref 0–100)

## 2015-07-20 LAB — TROPONIN I: Troponin I: 0.09 ng/mL (ref <0.03)

## 2015-07-20 LAB — CBG MONITORING, ED: GLUCOSE-CAPILLARY: 187 mg/dL — AB (ref 65–99)

## 2015-07-20 LAB — I-STAT TROPONIN, ED: Troponin i, poc: 0.09 ng/mL (ref 0.00–0.08)

## 2015-07-20 LAB — BRAIN NATRIURETIC PEPTIDE: B NATRIURETIC PEPTIDE 5: 314.3 pg/mL — AB (ref 0.0–100.0)

## 2015-07-20 MED ORDER — CEPHALEXIN 500 MG PO CAPS: 500.0000 mg | ORAL_CAPSULE | Freq: Three times a day (TID) | ORAL | Status: DC

## 2015-07-20 MED ORDER — IOPAMIDOL (ISOVUE-370) INJECTION 76%
INTRAVENOUS | Status: AC
  Administered 2015-07-20: 100 mL
  Filled 2015-07-20: qty 100

## 2015-07-20 MED ORDER — FUROSEMIDE 10 MG/ML IJ SOLN
40.0000 mg | INTRAMUSCULAR | Status: AC
  Administered 2015-07-20: 40 mg via INTRAVENOUS
  Filled 2015-07-20: qty 4

## 2015-07-20 NOTE — Telephone Encounter (Signed)
New message      Pt c/o Shortness Of Breath: STAT if SOB developed within the last 24 hours or pt is noticeably SOB on the phone  1. Are you currently SOB (can you hear that pt is SOB on the phone)?  Talking to wife  2. How long have you been experiencing SOB?  Started at 3am this am 3. Are you SOB when sitting or when up moving around? sitting 4. Are you currently experiencing any other symptoms? Pt had CABG on 07-09-15 by Dr Dorris FetchHendrickson

## 2015-07-20 NOTE — Telephone Encounter (Signed)
I spoke with the pt and he complains of SOB that started around 2:00-3:00 AM this morning.  The pt said he did use his CPAP last night like normal and had about 4 hours of sleep and then he woke up restless.  The pt was able to go back to sleep for about an hour and then woke up SOB.  The pt said he feels like he is forcing himself to take a deep breath. The pt is anxious due to SOB and denies any new symptoms.  The pt has monitored his vitals at home and BP average 116/60, pulse 75-80.  The pt has not had a CXR since his s/p CABG and AVR  (DC 07/14/15). Will discuss with DOD.

## 2015-07-20 NOTE — ED Notes (Signed)
Pt states he woke up feeling sob denies any chest pain. Pt states he had triple bypass surgery on 07/09/15. Pt was seen at cardiologist office  Today and had chest xray and ekg and was sent here for further evaluation. Pt is warm and dry. No labored respirations.

## 2015-07-20 NOTE — Discharge Instructions (Signed)
Your testing today has shown that you have an increased amount of fluid which is causing her shortness of breath most likely. Please start to record your weight daily, if you find that her weight is increasing and should take an additional dose of Lasix and call your doctor. He should be seen within 24-48 hours for follow-up at your doctor's office. Return to the emergency department for severe or worsening difficult to breathing or chest pain.

## 2015-07-20 NOTE — ED Notes (Signed)
Patient transported to CT 

## 2015-07-20 NOTE — Telephone Encounter (Signed)
I spoke with Dr Herbie BaltimoreHarding (DOD) and he advised that the pt take his normal dosage of Furosemide this morning and see if his SOB improves through out the morning.  If SOB improves then the pt can take an additional dosage of Furosemide 40mg  at 1:00 this afternoon.  Dr Herbie BaltimoreHarding also recommended that the pt contact [facility] to discuss SOB since he is s/p CABG and AVR 07/09/15. I spoke with the pt's wife and made her aware of this information. She said the pt took his Furosemide after our initial conversation.  The pt's scales are broken and he has not weighed since hospital discharge. I advised her of the importance of weighing daily and she will attempt to get new scales.  She agreed with plan.

## 2015-07-20 NOTE — ED Notes (Signed)
Pt left at this time with all belongings.  

## 2015-07-20 NOTE — ED Provider Notes (Signed)
CSN: [id number]     Arrival date & time 07/20/15  1554 History   First MD Initiated Contact with Patient 07/20/15 1621     Chief Complaint  Patient presents with  . Shortness of Breath     (Consider location/radiation/quality/duration/timing/severity/associated sxs/prior Treatment) HPI Comments: The patient is a 65 year old male with a history of coronary disease as well as multiple other problems including diabetes and hypertension. He presents to the hospital with increasing and acutely worsening shortness of breath since leaving the hospital on July 11 after undergoing coronary artery bypass grafting and an aortic valve replacement. The patient was on both Plavix and aspirin pre-and post surgery. He has been on those medications for 6 days after having surgery. He complains of swelling of his legs as well as some redness around the surgical incision sites of his vein harvest sites of the left lower extremity. He reports that he has been short of breath with a heaviness on his chest since being discharged from the hospital but at 4:00 this morning it became acutely worse. It has been severe, he is unable to lay flat on his back because of the shortness of breath and it is been persistent. He went to the office today, he was seen by the advanced practice provider who ordered an x-ray and have the patient sent to the hospital for further evaluation.  Patient is a 65 y.o. male presenting with shortness of breath. The history is provided by the patient, a relative and medical records.  Shortness of Breath   Past Medical History  Diagnosis Date  . Diabetes mellitus Dx'd age 27    Novolog via insulin pump; sub-optimal control x years  . High cholesterol     Takes 1/2 of  atorvastatin daily  . Hypothyroidism     Thyroidectomy 2002; multinodular goiter  . Erectile dysfunction     Se[place]rologist [place]  . Diabetic retinopathy     Proliferative: Hx of retinal detachment on right-light  perception only in right eye  . History of tobacco abuse     Quit about 1990 (has 35 pack-yr hx)  . OSA (obstructive sleep apnea)     06/2011 sleep study: moderate OSA, CPAP at 12 cm H2O.  . Impaired vision     right eye light perception only; hx of retinal detachment.  . Recurrent pneumonia   . GERD (gastroesophageal reflux disease)   . Venous insufficiency   . Diastolic dysfunction 2007; 2016    TEE with diastolic dysfunction, EF 74%.  . Diabetic neuropathy (HCC)     fine touch and position sense affected  . Aortic stenosis, moderate Sept/Oct /2013    Mild/mod by echo; mild by cath 10/28/11.  Mild progression by echo 05/2012.  Further mild progression on 01/2014 echo (valve area 0.93 cm2).  Further progression on echo 01/2015.  Cath planned as of 06/2015 (Dr. [name]).  S/P cath, plan is for bioprosthetic AVR by Dr. Dorris Fetch on July 06/2015  . Carotid stenosis, right 09/2011    Dr. [name] did R carotid stenting 01/2014.  Carotid dopplers 06/2015 essentially normal.  . 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  Med mgmt recommended.  However, as of 07/02/15 the plan is for CABG by Dr. Dorris Fetch 07/09/15.  . Abnormal stress test 10/27/2011    lat isch--cardiac cath 10/28/2011  . Venous reflux 10/14/2011    venous doppler-R  GSV continuous reflux throughout; too small for VNUS closure  . Decreased pedal pulses     LE doppler 11/08/11- no evidence of arterial insufficiency  . TIA (transient ischemic attack) 2015/16    with R carotid dz: pt got R carotid stent 01/2014 by Dr. [name] and was put on dual antiplatelet therapy with ASA 325mg  and plavix 75mg  qd afterwards  . Osteoarthritis     Bilat thumb carpometacarpal joints.  . Impingement syndrome of right shoulder   . TIA (transient ischemic attack) 05/2015    Left brain (right sided numbness + slurred speech)  Admitted for obs/workup 05/2015.  [name]  Complication of anesthesia     difficulty with intubation,   . Stroke [facility])     TIA  . Dizziness     feels off balance  . Neuromuscular disorder (HCC)     neuropathy  . Heart murmur   . Shortness of breath dyspnea   . Difficult intubation     ~ 2002 difficult fiberoptic intubation with takeback bleeding s/p thyroidectomy;  for thyroidectomy was intubated DL X 1 with cricoid pressure but difficult mask (full beard)   Past Surgical History  Procedure Laterality Date  . Thyroid surgery  2002  . Lasix eye    . Retinal detachment surgery      Right eye  . Cataract extraction      left  . Transthoracic echocardiogram  10/2011;12/2012;01/2015    Mod AS, mild LVH, EF 60-65%, no RWMA.  01/2015 EF 60-65%, grade I DD, progression of mod/sev AS--being followed by Dr. [name]  . Cardiac catheterization  10/2011    EF normal.  Diffuse 3 vessel CAD, no stents placed.  Medical mgmt per [facility].  [name] Left and right heart catheterization with coronary angiogram N/A 10/28/2011    Procedure: LEFT AND R0IHT HEART CATHETERIZATION WITH CORONARY ANGIOGRAM;  Surgeon: Lennette Bihari, MD;  Location: [facility] CATH LAB;  Service: Cardiovascular;  Laterality: N/A;  . Carotid stent insertion Right 01/16/2014    Procedure: CAROTID STENT INSERTION;  Surgeon: Runell Gess, MD;  Location: [facility] CATH LAB;  Service: Cardiovascular;  Laterality: Right;  . Eye surgery      Multiple laser surgeries for diabetic retinopathy, also cataract surgery OU.  . Cardiac catheterization N/A 06/22/2015    Procedure: Right/Left Heart Cath and Coronary Angiography;  Surgeon: Runell Gess, MD;  Location: [facility] INVASIVE CV LAB;  Service: Cardiovascular;  Laterality: N/A;  . Carotid dopplers  06/30/15    Normal: repeat when clinically indicated per Dr. [name]  . Aortic valve replacement N/A 07/09/2015    Procedure: AORTIC VALVE REPLACEMENT (AVR);  Surgeon: Loreli Slot, MD;  Location: [facility] OR;  Service: Open Heart Surgery;  Laterality: N/A;  .  Coronary artery bypass graft N/A 07/09/2015    Procedure: CORONARY ARTERY BYPASS GRAFTING (CABG)TIMES THREE USING LEFT INTERNAL MAMMARY ARTERY AND LEFT SAPHENOUS LEG VEIN HARVESTED ENDOSCOPICALLY;  Surgeon: Loreli Slot, MD;  Location: [facility] OR;  Service: Open Heart Surgery;  Laterality: N/A;  . Intraoperative transesophageal echocardiogram N/A 07/09/2015    Procedure: INTRAOPERATIVE TRANSESOPHAGEAL ECHOCARDIOGRAM;  Surgeon: Loreli Slot, MD;  Location: [facility] OR;  Service: Open Heart Surgery;  Laterality: N/A;   Family History  Problem Relation Age of Onset  . Cancer Mother     lung cancer  . Alcohol abuse Father   . Cancer Father     laryngeal cancer  . Cancer Brother     oldest brother had lung cancer  and melanoma   Social History  Substance Use Topics  . Smoking status: Former Games developer  . Smokeless tobacco: Former Neurosurgeon    Quit date: 06/22/1987  . Alcohol Use: 0.0 oz/week    0 Standard drinks or equivalent per week     Comment: occasional    Review of Systems  Respiratory: Positive for shortness of breath.   All other systems reviewed and are negative.     Allergies  Rosuvastatin calcium; Statins; and Tape  Home Medications   Prior to Admission medications   Medication Sig Start Date End Date Taking? Authorizing Provider  aspirin EC 81 MG tablet Take 81 mg by mouth daily.   Yes Historical Provider, MD  clopidogrel (PLAVIX) 75 MG tablet Take 1 tablet (75 mg total) by mouth daily. 03/16/15  Yes Runell Gess, MD  clotrimazole-betamethasone (LOTRISONE) cream Apply 1 application topically daily as needed (RASH). Patient taking differently: Apply 1 application topically daily as needed (For rash.).  03/17/15  Yes [name], MD  ferrous sulfate 325 (65 FE) MG tablet Take 1 tablet (325 mg total) by mouth 2 (two) times daily with a meal. 07/16/15  Yes Loreli Slot, MD  FEVERFEW PO Take 1 capsule by mouth daily. To prevent migraines   Yes Historical Provider,  MD  folic acid (FOLVITE) 1 MG tablet Take 1 tablet (1 mg total) by mouth daily. 07/16/15  Yes Loreli Slot, MD  furosemide (LASIX) 40 MG tablet Take 1 tablet (40 mg total) by mouth daily. 07/14/15  Yes Wayne E Gold, PA-C  Insulin Human (INSULIN PUMP) SOLN Inject into the skin continuous. Basal rate .95/hr, current pump settings: 12am .7, 2am 1, 7am 1.1, 11am .9, 5pm .9.25, 11pm .85. Uses Novolog Insulin.   Yes Historical Provider, MD  levothyroxine (SYNTHROID, LEVOTHROID) 137 MCG tablet TAKE ONE TABLET BY MOUTH ONCE DAILY 07/01/15  Yes [name], MD  lisinopril (PRINIVIL,ZESTRIL) 10 MG tablet Take 1 tablet (10 mg total) by mouth daily. 07/14/15  Yes Wayne E Gold, PA-C  metoprolol tartrate (LOPRESSOR) 25 MG tablet Take 1 tablet (25 mg total) by mouth 2 (two) times daily. 07/14/15  Yes Wayne E Gold, PA-C  Multiple Vitamins-Minerals (ONE-A-DAY MENS 50+ ADVANTAGE) TABS Take 1 tablet by mouth daily with breakfast.   Yes Historical Provider, MD  oxyCODONE (OXY IR/ROXICODONE) 5 MG immediate release tablet Take 1-2 tablets (5-10 mg total) by mouth every 6 (six) hours as needed for moderate pain, severe pain or breakthrough pain. 07/14/15  Yes Wayne E Gold, PA-C  potassium chloride SA (K-DUR,KLOR-CON) 20 MEQ tablet Take 1 tablet (20 mEq total) by mouth daily. 07/14/15  Yes Wayne E Gold, PA-C  ranitidine (ZANTAC) 150 MG tablet Take 150 mg by mouth at bedtime as needed for heartburn.    Yes Historical Provider, MD  tadalafil (CIALIS) 20 MG tablet Take 1 tablet (20 mg total) by mouth daily as needed for erectile dysfunction. 10/03/14  Yes Carlus Pavlov, MD  cephALEXin (KEFLEX) 500 MG capsule Take 1 capsule (500 mg total) by mouth 3 (three) times daily. 07/20/15   Erin R Barrett, PA-C   BP 135/76 mmHg  Pulse 85  Temp(Src) 99.2 F (37.3 C) (Oral)  Resp 13  SpO2 94% Physical Exam  Constitutional: He appears well-developed and well-nourished. No distress.  HENT:  Head: Normocephalic and atraumatic.   Mouth/Throat: Oropharynx is clear and moist. No oropharyngeal exudate.  Eyes: Conjunctivae and EOM are normal. Pupils are equal, round, and reactive to light.  Right eye exhibits no discharge. Left eye exhibits no discharge. No scleral icterus.  Neck: Normal range of motion. Neck supple. No JVD present. No thyromegaly present.  Cardiovascular: Normal rate, regular rhythm, normal heart sounds and intact distal pulses.  Exam reveals no gallop and no friction rub.   No murmur heard. Pulmonary/Chest: Effort normal and breath sounds normal. No respiratory distress. He has no wheezes. He has no rales.  Slight rales at the bases bilaterally, no distress, speaks in full sentences  Abdominal: Soft. Bowel sounds are normal. He exhibits no distension and no mass. There is no tenderness.  Musculoskeletal: Normal range of motion. He exhibits edema. He exhibits no tenderness.  Bilateral lower extremity edema left greater than right, incisional sites of the left medial leg with slight erythema surrounding them, no purulent discharge or drainage  Lymphadenopathy:    He has no cervical adenopathy.  Neurological: He is alert. Coordination normal.  Skin: Skin is warm and dry. No rash noted. No erythema.  Incisional sites over the median sternotomy as well as the abdominal port sites appear clean, there is no drainage or surrounding erythema.  Psychiatric: He has a normal mood and affect. His behavior is normal.  Nursing note and vitals reviewed.   ED Course  Procedures (including critical care time) Labs Review Labs Reviewed  BASIC METABOLIC PANEL - Abnormal; Notable for the following:    Potassium 5.5 (*)    Chloride 99 (*)    Glucose, Bld 197 (*)    Creatinine, Ser 1.27 (*)    GFR calc non Af Amer 57 (*)    All other components within normal limits  CBC - Abnormal; Notable for the following:    WBC 13.1 (*)    RBC 3.36 (*)    Hemoglobin 10.5 (*)    HCT 31.3 (*)    Platelets 556 (*)    All other  components within normal limits  BRAIN NATRIURETIC PEPTIDE - Abnormal; Notable for the following:    B Natriuretic Peptide 314.3 (*)    All other components within normal limits  TROPONIN I - Abnormal; Notable for the following:    Troponin I 0.09 (*)    All other components within normal limits  CBG MONITORING, ED - Abnormal; Notable for the following:    Glucose-Capillary 187 (*)    All other components within normal limits  I-STAT TROPOININ, ED - Abnormal; Notable for the following:    Troponin i, poc 0.09 (*)    All other components within normal limits  I-STAT CHEM 8, ED - Abnormal; Notable for the following:    Potassium 5.5 (*)    Chloride 98 (*)    Creatinine, Ser 1.30 (*)    Glucose, Bld 196 (*)    Hemoglobin 11.2 (*)    HCT 33.0 (*)    All other components within normal limits    Imaging Review Dg Chest 2 View  07/20/2015  CLINICAL DATA:  Postop CABG in 11 days ago. Shortness of breath. History of diabetes. EXAM: CHEST  2 VIEW COMPARISON:  07/13/2015 and 07/12/2015. FINDINGS: The heart size and mediastinal contours are stable status post CABG and aortic valve replacement. The bilateral pleural effusions and associated bibasilar atelectasis have mildly improved. There is no edema or pneumothorax. The bones appear unchanged. IMPRESSION: Slight improvement in pleural effusions and bibasilar atelectasis. Electronically Signed   By: Carey BullocksWilliam  Veazey M.D.   On: 07/20/2015 14:19   Ct Angio Chest Pe W Or Wo Contrast  07/20/2015  CLINICAL DATA:  Shortness of breath and chest pain. EXAM: CT ANGIOGRAPHY CHEST WITH CONTRAST TECHNIQUE: Multidetector CT imaging of the chest was performed using the standard protocol during bolus administration of intravenous contrast. Multiplanar CT image reconstructions and MIPs were obtained to evaluate the vascular anatomy. CONTRAST:  80 mL of Isovue 370 COMPARISON:  Chest x-ray from earlier today. FINDINGS: Central airways are within normal limits. No  pneumothorax. Bilateral pleural effusions are identified. Opacities underlying the effusions are likely atelectasis. No pulmonary nodules or masses. Increased attenuation deep to the sternum is consistent with the patient's recent sternotomy. No abnormal fluid collections are seen within the mediastinum. No adenopathy. Coronary artery calcifications are identified. No pericardial effusion. The heart is borderline to mildly enlarged. Atherosclerotic change seen in the aorta with no aneurysm or dissection. Evaluation of the lower lobe pulmonary arteries is limited due to atelectasis/volume loss. Within this limitation, no pulmonary emboli identified evaluation of the upper abdomen is limited with no acute abnormalities. Degenerative changes seen in the spine. Review of the MIP images confirms the above findings. IMPRESSION: 1. No pulmonary emboli. 2. Bilateral pleural effusions with underlying atelectasis. Electronically Signed   By: Gerome Sam III M.D   On: 07/20/2015 18:26   I have personally reviewed and evaluated these images and lab results as part of my medical decision-making.   EKG Interpretation   Date/Time:  Monday July 20 2015 16:00:47 EDT Ventricular Rate:  83 PR Interval:  156 QRS Duration: 106 QT Interval:  386 QTC Calculation: 453 R Axis:   -14 Text Interpretation:  Normal sinus rhythm Possible Anterior infarct , age  undetermined Abnormal ECG Since last tracing Left ventricular hypertrophy  has rsolved Confirmed by Hyacinth Meeker  MD, Kimberla Driskill ([id number]) on 07/20/2015 4:34:36 PM      MDM   Final diagnoses:  Shortness of breath    The patient has an EKG which is overall unremarkable, his clinical exam is such that there is no obvious answer however the swelling in the lower extremities could be from his graft sites and lack of venous return though it could also be from DVT, I would also consider that this could be a pericardial effusion as he does have cardiomegaly on x-ray. The patient  will need to have further workup including a CT scan of the chest.  D/w Dr. Laneta Simmers - lasix and home.  CT reassuring - d/w family - they are in agreement.  Additional dose of Lasix given, patient informed of the follow-up plan and is in total agreement. Vital signs have been stable throughout, no fever, no tachycardia, no hypotension, no hypoxia of concern.  Meds given in ED:  Medications  furosemide (LASIX) injection 40 mg (not administered)  iopamidol (ISOVUE-370) 76 % injection (100 mLs  Contrast Given 07/20/15 1742)    New Prescriptions   No medications on file      Eber Hong, MD 07/20/15 1911

## 2015-07-20 NOTE — Progress Notes (Addendum)
HPI: Patient presents to office today with complaints of shortness of breath.  He is S/P CABG/AVR done 07/09/2015.  He is accompanied by his wife who states they contacted the Cardiologist office today with complaints of shortness of breath.  They were instructed to take the dose of scheduled Lasix and if the symptoms improved, they were to repeat the dose at 1pm today.  They were also instructed to contact our office for further follow up due to recent surgery.  The patient states the Lasix did not help.  He states he developed acute shortness of breath this morning.  He states he feels a little better if he leans forward.  He has been unable to lay flat and rest due to the shortness of breath as well.  He also states he has swelling in his lower extremities. Review of vitals today shows a 6 lb weight loss since hospital discharge.    Current Outpatient Prescriptions  Medication Sig Dispense Refill  . aspirin EC 81 MG tablet Take 81 mg by mouth daily.    . clopidogrel (PLAVIX) 75 MG tablet Take 1 tablet (75 mg total) by mouth daily. 90 tablet 3  . clotrimazole-betamethasone (LOTRISONE) cream Apply 1 application topically daily as needed (RASH). (Patient taking differently: Apply 1 application topically daily as needed (For rash.). ) 45 g 2  . ferrous sulfate 325 (65 FE) MG tablet Take 1 tablet (325 mg total) by mouth 2 (two) times daily with a meal. 60 tablet 1  . FEVERFEW PO Take 1 capsule by mouth daily. To prevent migraines    . folic acid (FOLVITE) 1 MG tablet Take 1 tablet (1 mg total) by mouth daily. 30 tablet 1  . furosemide (LASIX) 40 MG tablet Take 1 tablet (40 mg total) by mouth daily. 14 tablet 0  . Insulin Human (INSULIN PUMP) SOLN Inject into the skin continuous. Basal rate .95/hr, current pump settings: 12am .7, 2am 1, 7am 1.1, 11am .9, 5pm .9.25, 11pm .85. Uses Novolog Insulin.    [name] levothyroxine (SYNTHROID, LEVOTHROID) 137 MCG tablet TAKE ONE TABLET BY MOUTH ONCE DAILY 90 tablet 1  .  lisinopril (PRINIVIL,ZESTRIL) 10 MG tablet Take 1 tablet (10 mg total) by mouth daily. 30 tablet 1  . metoprolol tartrate (LOPRESSOR) 25 MG tablet Take 1 tablet (25 mg total) by mouth 2 (two) times daily. 60 tablet 1  . oxyCODONE (OXY IR/ROXICODONE) 5 MG immediate release tablet Take 1-2 tablets (5-10 mg total) by mouth every 6 (six) hours as needed for moderate pain, severe pain or breakthrough pain. 30 tablet 0  . potassium chloride SA (K-DUR,KLOR-CON) 20 MEQ tablet Take 1 tablet (20 mEq total) by mouth daily. 14 tablet 0  . Prenatal Vit-Fe Fumarate-FA (M-VIT) tablet Take 1 tablet by mouth daily.    . ranitidine (ZANTAC) 150 MG tablet Take 150 mg by mouth at bedtime.     . tadalafil (CIALIS) 20 MG tablet Take 1 tablet (20 mg total) by mouth daily as needed for erectile dysfunction. 10 tablet 1  . cephALEXin (KEFLEX) 500 MG capsule Take 1 capsule (500 mg total) by mouth 3 (three) times daily. 30 capsule 0   No current facility-administered medications for this visit.    Physical Exam:  BP 109/61 mmHg  Pulse 84  Resp 16  Ht  (1.88 m)  Wt 218 lb 12.8 oz (99.247 kg)  BMI 28.08 kg/m2  SpO2 93%  Gen: no apparent distress, looks acutely ill Heart: RRR Lungs: diminished bibasilar Ext:  bilateral edema, mild pitting L> R Wounds; sternotomy well healed... EVH sites are reddened warm to touch ecchymosis present down LLL  Diagnostic Tests:  CXR: improvement of bilateral pleural effusions, + atelectasis bilaterally  A/P:  1. Acute onset shortness of breath- not relieved with Lasix.[name].[name]. However does improve if patient leans forward 2. Pleural Effusion- improved on CXR 3. Early LLE cellulitis- will give patient keflex 500 mg TID for 10 days 4. Concerning for possible Pulmonary embolism or Pericardial Effusion, spoke with Dr. Laneta SimmersBartle who was present in the office and he recommended the patient present to the ED for CTA of the chest with PE protocol... Patient was agreeable and left for ED for  evaluation  Lowella DandyBARRETT, Bridie Colquhoun, PA-C [facility] [phone]

## 2015-07-21 ENCOUNTER — Ambulatory Visit: Payer: PRIVATE HEALTH INSURANCE | Admitting: Neurology

## 2015-07-21 ENCOUNTER — Telehealth: Payer: Self-pay | Admitting: Cardiovascular Disease

## 2015-07-21 ENCOUNTER — Telehealth: Payer: Self-pay

## 2015-07-21 ENCOUNTER — Telehealth: Payer: Self-pay | Admitting: Internal Medicine

## 2015-07-21 NOTE — Telephone Encounter (Signed)
Called patient's wife (DPR) about her message. Patient complaining of dizziness today with low BP. Patient has taken all his medications today. Patient had recent valve replacement with Dr. Dorris FetchHendrickson. Patient's wife stated she called their office and they referred her to patient's cardiologist. Informed patient's wife to recheck BP before patient takes his night time medications and to hold his metoprolol tonight if BP is still low. Encouraged patient to drink some fluids, since he has not had much to drink today. Patient was in the ED last night and was given IV Lasix. Saw patient's labs from ED, potassium is elevated, troponin elevated, elevated BNP and elevated creatinine. Will forward to Dr. Allyson SabalBerry for advisement. If symptoms get worse he should go to ED. Patient's wife verbalized understanding.

## 2015-07-21 NOTE — Telephone Encounter (Signed)
PT wife called, PT had heart surgery back on the 11th, since then he is having blood sugar problems.  She would like a call back to discuss.

## 2015-07-21 NOTE — Telephone Encounter (Signed)
°  New Prob  Pt c/o BP issue: STAT if pt c/o blurred vision, one-sided weakness or slurred speech  1. What are your last 5 BP readings? 134/68, 109/61, 87/50, 96/46, 94/57  2. Are you having any other symptoms (ex. Dizziness, headache, blurred vision, passed out)? Generalized weakness and dizziness x 1 day  3. What is your BP issue? Wife is concerned as blood pressure continues to decrease    Pt received IV Lasix this last night at the Emergency Department as he was being evaluated for shortness of breath.

## 2015-07-21 NOTE — Telephone Encounter (Signed)
I contacted the pt. He states he is going to have his wife bring his meter by the office for us to download tomorrow for Dr. Elvera LennoxGherghe tomorrow so she can review and advise.

## 2015-07-21 NOTE — Telephone Encounter (Signed)
Patient is returning your call.  

## 2015-07-21 NOTE — Telephone Encounter (Signed)
Left message for patient wife to call back regarding husband blood sugar issues. Will await call or call patient back.

## 2015-07-21 NOTE — Telephone Encounter (Signed)
Hard to eval unless he is seen.

## 2015-07-22 ENCOUNTER — Encounter: Payer: Self-pay | Admitting: Internal Medicine

## 2015-07-22 ENCOUNTER — Telehealth: Payer: Self-pay | Admitting: Thoracic Surgery (Cardiothoracic Vascular Surgery)

## 2015-07-22 ENCOUNTER — Encounter: Payer: Self-pay | Admitting: Family Medicine

## 2015-07-22 NOTE — Telephone Encounter (Signed)
Called patient to check on him.  He had been in ED Monday, then I noticed he had called Dr. Hazle CocaBerry's office yesterday.   He says he feels much better today. Swelling has gone down since he was given IV lasix on Monday and his breathing has improved.  No dizziness today  I will see him in the office Monday

## 2015-07-22 NOTE — Telephone Encounter (Signed)
Returned call to patient's wife.She stated husband is still sleeping.Stated he rested well last night.Dr.Berry advised needs to be seen.Stated he has appointment with Ward Givenshris Berge NP [facility]/06/[facility].Advised to continue to monitor B/P and call back sooner if needed.

## 2015-07-24 ENCOUNTER — Other Ambulatory Visit: Payer: Self-pay | Admitting: Thoracic Surgery (Cardiothoracic Vascular Surgery)

## 2015-07-24 DIAGNOSIS — Z951 Presence of aortocoronary bypass graft: Secondary | ICD-10-CM

## 2015-07-27 ENCOUNTER — Encounter: Payer: Self-pay | Admitting: Thoracic Surgery (Cardiothoracic Vascular Surgery)

## 2015-07-28 ENCOUNTER — Encounter: Payer: Self-pay | Admitting: Thoracic Surgery (Cardiothoracic Vascular Surgery)

## 2015-07-29 ENCOUNTER — Ambulatory Visit
Admission: RE | Admit: 2015-07-29 | Discharge: 2015-07-29 | Disposition: A | Discharge status: Home / Self Care | Payer: PRIVATE HEALTH INSURANCE | Source: Ambulatory Visit | Attending: Thoracic Surgery (Cardiothoracic Vascular Surgery) | Admitting: Thoracic Surgery (Cardiothoracic Vascular Surgery)

## 2015-07-29 ENCOUNTER — Ambulatory Visit (INDEPENDENT_AMBULATORY_CARE_PROVIDER_SITE_OTHER): Payer: Self-pay | Admitting: Thoracic Surgery (Cardiothoracic Vascular Surgery)

## 2015-07-29 ENCOUNTER — Telehealth: Payer: Self-pay | Admitting: Cardiovascular Disease

## 2015-07-29 ENCOUNTER — Encounter: Payer: Self-pay | Admitting: Thoracic Surgery (Cardiothoracic Vascular Surgery)

## 2015-07-29 VITALS — BP 92/54 | HR 77 | Resp 20 | Ht 74.0 in | Wt 218.0 lb

## 2015-07-29 DIAGNOSIS — Z954 Presence of other heart-valve replacement: Secondary | ICD-10-CM

## 2015-07-29 DIAGNOSIS — J90 Pleural effusion, not elsewhere classified: Secondary | ICD-10-CM

## 2015-07-29 DIAGNOSIS — Z951 Presence of aortocoronary bypass graft: Secondary | ICD-10-CM

## 2015-07-29 DIAGNOSIS — I350 Nonrheumatic aortic (valve) stenosis: Secondary | ICD-10-CM

## 2015-07-29 DIAGNOSIS — Z952 Presence of prosthetic heart valve: Secondary | ICD-10-CM

## 2015-07-29 DIAGNOSIS — I2510 Atherosclerotic heart disease of native coronary artery without angina pectoris: Secondary | ICD-10-CM

## 2015-07-29 NOTE — Telephone Encounter (Signed)
Closed Encounter  °

## 2015-07-29 NOTE — Patient Instructions (Signed)
If you gain more than 3 pounds in a day or 5 pounds in 3 days that is a sign of fluid retention. Call the office if that occurs.  Stop lasix and potassium  Stop Keflex after today  Stop metoprolol (lopressor)  Stop Iron

## 2015-07-29 NOTE — Progress Notes (Signed)
[address]       [place] [address]             [phone]       HPI: Mr. Abercrombie returns today for follow-up after aortic valve replacement and coronary bypass grafting.  Mr. 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 His postoperative course was notable for elevated blood sugars. He was discharged on postoperative day #5.  He presented back to the office on 07/20/2015 with complaints of shortness of breath. He was in the emergency room. His EKG showed no significant changes and a CT angiogram showed no evidence of pulmonary embolus. He did have small bilateral pleural effusions. There was no significant pericardial effusion.  He says that he felt better for a couple days after that generally has not felt well. His blood sugars have been in the 200 range. He has felt dizzy and today felt like his equilibrium was off. He says he feels "cloudy in the head." He feels like he is getting depressed.  He was on Lasix and potassium. He took his last dose of those yesterday. He was started on Keflex for some redness around his leg incision. He finishes that today.  He reports that he had been on metoprolol before and had problems with feeling tired and rundown and also felt like it affected his memory and possibly his blood sugars.  Past Medical History:  Diagnosis Date  . Abnormal stress test 10/27/2011   lat isch--cardiac cath 10/28/2011  . Aortic stenosis, moderate Sept/Oct /2013   Mild/mod by echo; mild by cath 10/28/11.  Mild progression by echo 05/2012.  Further mild progression on 01/2014 echo (valve area 0.93 cm2).  Further progression on echo 01/2015.  Cath planned as of 06/2015 (Dr. [name]).  S/P cath, plan is for bioprosthetic AVR by Dr. Dorris Fetch on July 06/2015  . 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  Med mgmt recommended.  However, as of 07/02/15 the plan is for CABG by Dr. Dorris Fetch 07/09/15.  . Carotid stenosis, right 09/2011   Dr. [name] did R carotid stenting 01/2014.  Carotid dopplers 06/2015 essentially normal.  . Complication of anesthesia    difficulty with intubation,   . Decreased pedal pulses    LE doppler 11/08/11- no evidence of arterial insufficiency  . Diabetes mellitus Dx'd age 41   Novolog via insulin pump; sub-optimal control x years  . Diabetic neuropathy (HCC)    fine touch and position sense affected  . Diabetic retinopathy    Proliferative: Hx of retinal detachment on right-light perception only in right eye  . Diastolic dysfunction 2007; 2016   TEE with diastolic dysfunction, EF 74%.  . Difficult intubation    ~ 2002 difficult fiberoptic intubation with takeback bleeding s/p thyroidectomy;  for thyroidectomy was intubated DL X 1 with cricoid pressure but difficult mask (full beard)  . Dizziness    feels off balance  . Erectile dysfunction    Se[place]rolog[place]  . GERD (gastroesophageal reflux disease)   . Heart murmur   . High cholesterol    Takes 1/2 of  atorvastatin daily  . History of tobacco abuse    Quit about 1990 (has 35 pack-yr hx)  . Hypothyroidism  Thyroidectomy 2002; multinodular goiter  . Impaired vision    right eye light perception only; hx of retinal detachment.  . Impingement syndrome of right shoulder   . Neuromuscular disorder (HCC)    neuropathy  . OSA (obstructive sleep apnea)    06/2011 sleep study: moderate OSA, CPAP at 12 cm H2O.  . Osteoarthritis    Bilat thumb carpometacarpal joints.  . Recurrent pneumonia   . Shortness of breath dyspnea   . Stroke [facility])    TIA  . TIA (transient ischemic attack) 2015/16   with R carotid dz: pt got R carotid stent 01/2014 by Dr. [name] and was put on dual antiplatelet therapy  with ASA 325mg  and plavix 75mg  qd afterwards  . TIA (transient ischemic attack) 05/2015   Left brain (right sided numbness + slurred speech)  Admitted for obs/workup 05/2015.  [name] Venous insufficiency   . Venous reflux 10/14/2011   venous doppler-R GSV continuous reflux throughout; too small for VNUS closure    Current Outpatient Prescriptions  Medication Sig Dispense Refill  . aspirin EC 81 MG tablet Take 81 mg by mouth daily.    . cephALEXin (KEFLEX) 500 MG capsule Take 1 capsule (500 mg total) by mouth 3 (three) times daily. 30 capsule 0  . clopidogrel (PLAVIX) 75 MG tablet Take 1 tablet (75 mg total) by mouth daily. 90 tablet 3  . clotrimazole-betamethasone (LOTRISONE) cream Apply 1 application topically daily as needed (RASH). (Patient taking differently: Apply 1 application topically daily as needed (For rash.). ) 45 g 2  . ferrous sulfate 325 (65 FE) MG tablet Take 1 tablet (325 mg total) by mouth 2 (two) times daily with a meal. 60 tablet 1  . FEVERFEW PO Take 1 capsule by mouth daily. To prevent migraines    . folic acid (FOLVITE) 1 MG tablet Take 1 tablet (1 mg total) by mouth daily. 30 tablet 1  . furosemide (LASIX) 40 MG tablet Take 1 tablet (40 mg total) by mouth daily. 14 tablet 0  . Insulin Human (INSULIN PUMP) SOLN Inject into the skin continuous. Basal rate .95/hr, current pump settings: 12am .7, 2am 1, 7am 1.1, 11am .9, 5pm .9.25, 11pm .85. Uses Novolog Insulin.    [name] levothyroxine (SYNTHROID, LEVOTHROID) 137 MCG tablet TAKE ONE TABLET BY MOUTH ONCE DAILY 90 tablet 1  . lisinopril (PRINIVIL,ZESTRIL) 10 MG tablet Take 1 tablet (10 mg total) by mouth daily. 30 tablet 1  . metoprolol succinate (TOPROL-XL) 25 MG 24 hr tablet Take 25 mg by mouth 2 (two) times daily.    . Multiple Vitamins-Minerals (ONE-A-DAY MENS 50+ ADVANTAGE) TABS Take 1 tablet by mouth daily with breakfast.    . oxyCODONE (OXY IR/ROXICODONE) 5 MG immediate release tablet Take 1-2 tablets (5-10 mg total) by mouth  every 6 (six) hours as needed for moderate pain, severe pain or breakthrough pain. 30 tablet 0  . potassium chloride SA (K-DUR,KLOR-CON) 20 MEQ tablet Take 1 tablet (20 mEq total) by mouth daily. 14 tablet 0  . ranitidine (ZANTAC) 150 MG tablet Take 150 mg by mouth at bedtime as needed for heartburn.     . tadalafil (CIALIS) 20 MG tablet Take 1 tablet (20 mg total) by mouth daily as needed for erectile dysfunction. 10 tablet 1   No current facility-administered medications for this visit.     Physical Exam BP (!) 92/54 (BP Location: Left Arm, Patient Position: Sitting, Cuff Size: Normal)   Pulse 77   Resp 20   Ht 6'  2" (1.88 m)   Wt 218 lb (98.9 kg)   SpO2 97% Comment: RA  BMI 27.79 kg/m  65 year old man in no acute distress Appears tired, flat affect Alert and oriented 3 with no focal deficits Cardiac regular rate and rhythm normal S1 and S2 no rub or murmur Lungs diminished at bases otherwise clear Sternal incision clean dry and intact, sternum stable Leg incision was some erythema and superficial eschar  Diagnostic Tests: CHEST  2 VIEW COMPARISON:  Chest x-ray and chest CT dated 07/20/2015 FINDINGS: Chronic mild cardiomegaly. Minimal residual bilateral pleural effusions and slight bibasilar atelectasis. Pulmonary vascularity is normal. No pneumothorax. No acute osseous abnormality. Previous aortic valve replacement and CABG. IMPRESSION: Diminished bibasilar atelectasis and small bilateral effusions. Electronically Signed   By: Francene Boyers M.D.   On: 07/29/2015 15:39  I personally reviewed his chest x-ray and concur with the findings noted above.  Impression: Mr. Napora is a 65 year old man with a brittle diabetes who recently had aortic valve replacement and coronary bypass grafting 3. That was about 3 weeks ago. He is feeling poorly on multiple fronts at the present time.  Hypotension - His blood pressure is low at 92/54, he has been on Lasix, lisinopril, and  Lopressor. His skin turgor is diminished slightly think he may be dehydrated. He has completed his course of Lasix, so that should improve with time. I am going to stop his Lopressor as he's had multiple side effects from that 4.  Dizziness- he feels like his equilibrium is off. Some that may be due to his blood pressure. He's had a variety of complaints related to metoprolol previously. Hopefully stopping the metoprolol and Lasix that will improve. He did have some of this prior to surgery so there may be other factors at play.  General malaise- common at this point after surgery. I reassured him that typically people go through a bit of a roller coaster ride postoperatively and generally the second and third weeks of the worst. Overall he should start seeing improvement over the next couple of weeks. If he was not such a brittle diabetic I would give him a short course of steroids, but he has had issues with taking prednisone before.  Anemia- his hematocrit was 33 on July 17. He's been on iron. I don't think he needs to continue that at this point. We will continue the folic acid.  Plan: Stop Lasix, potassium, iron, metoprolol. Keflex will finish today He has an appointment with Dr. [name] on 08/06/2015. I will see him back in 2 weeks.  Loreli Slot, MD [facility] [phone]

## 2015-07-30 ENCOUNTER — Encounter: Payer: Self-pay | Admitting: Internal Medicine

## 2015-08-06 ENCOUNTER — Ambulatory Visit (INDEPENDENT_AMBULATORY_CARE_PROVIDER_SITE_OTHER): Payer: PRIVATE HEALTH INSURANCE | Admitting: Nurse Practitioner

## 2015-08-06 ENCOUNTER — Encounter: Payer: Self-pay | Admitting: Nurse Practitioner

## 2015-08-06 VITALS — BP 115/52 | HR 93 | Ht 74.0 in | Wt 215.0 lb

## 2015-08-06 DIAGNOSIS — I739 Peripheral vascular disease, unspecified: Secondary | ICD-10-CM

## 2015-08-06 DIAGNOSIS — I2510 Atherosclerotic heart disease of native coronary artery without angina pectoris: Secondary | ICD-10-CM

## 2015-08-06 DIAGNOSIS — Z954 Presence of other heart-valve replacement: Secondary | ICD-10-CM

## 2015-08-06 DIAGNOSIS — I350 Nonrheumatic aortic (valve) stenosis: Secondary | ICD-10-CM

## 2015-08-06 DIAGNOSIS — E875 Hyperkalemia: Secondary | ICD-10-CM | POA: Diagnosis not present

## 2015-08-06 DIAGNOSIS — E785 Hyperlipidemia, unspecified: Secondary | ICD-10-CM

## 2015-08-06 DIAGNOSIS — I779 Disorder of arteries and arterioles, unspecified: Secondary | ICD-10-CM

## 2015-08-06 DIAGNOSIS — Z952 Presence of prosthetic heart valve: Secondary | ICD-10-CM

## 2015-08-06 NOTE — Progress Notes (Signed)
Office Visit    Patient Name: [name] Date of Encounter: 08/06/2015  Primary Care Provider:  [name], MD Primary Cardiologist:  Erlene Quan, MD   Chief Complaint    65 year old male with a history of CAD, aortic stenosis, carotid arterial disease, TIA, and diabetes, who is recently status post aortic valve replacement and CABG and presents for follow-up.  Past Medical History    Past Medical History:  Diagnosis Date  . Abnormal stress test 10/27/2011   lat isch--cardiac cath 10/28/2011  . Aortic stenosis, moderate Sept/Oct /2013   Mild/mod by echo; mild by cath 10/28/11.  Mild progression by echo 05/2012.  Further mild progression on 01/2014 echo (valve area 0.93 cm2).  Further progression on echo 01/2015.  Cath planned as of 06/2015 (Dr. [name]).  S/P cath, plan is for bioprosthetic AVR by Dr. Dorris Fetch on July 06/2015  . 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  Med mgmt recommended.  However, as of 07/02/15 the plan is for CABG by Dr. Dorris Fetch 07/09/15.  . Carotid stenosis, right 09/2011   Dr. [name] did R carotid stenting 01/2014.  Carotid dopplers 06/2015 essentially normal.  . Complication of anesthesia    difficulty with intubation,   . Decreased pedal pulses    LE doppler 11/08/11- no evidence of arterial insufficiency  . Diabetes mellitus Dx'd age 53   Novolog via insulin pump; sub-optimal control x years  . Diabetic neuropathy (HCC)    fine touch and position sense affected  . Diabetic retinopathy    Proliferative: Hx of retinal detachment on right-light perception only in right eye  . Diastolic dysfunction 2007; 2016   TEE with diastolic dysfunction, EF 74%.  . Difficult intubation    ~ 2002 difficult fiberoptic intubation with takeback bleeding s/p thyroidectomy;  for thyroidectomy was intubated DL X 1 with cricoid pressure but difficult mask (full  beard)  . Dizziness    feels off balance  . Erectile dysfunction    [place]  . GERD (gastroesophageal reflux disease)   . Heart murmur   . High cholesterol    Takes 1/2 of  atorvastatin daily  . History of tobacco abuse    Quit about 1990 (has 35 pack-yr hx)  . Hypothyroidism    Thyroidectomy 2002; multinodular goiter  . Impaired vision    right eye light perception only; hx of retinal detachment.  . Impingement syndrome of right shoulder   . Neuromuscular disorder (HCC)    neuropathy  . OSA (obstructive sleep apnea)    06/2011 sleep study: moderate OSA, CPAP at 12 cm H2O.  . Osteoarthritis    Bilat thumb carpometacarpal joints.  . Recurrent pneumonia   . Shortness of breath dyspnea   . Stroke [facility])    TIA  . TIA (transient ischemic attack) 2015/16   with R carotid dz: pt got R carotid stent 01/2014 by Dr. [name] and was put on dual antiplatelet therapy with ASA  and plavix  qd afterwards  . TIA (transient ischemic attack) 05/2015   Left brain (right sided numbness + slurred speech)  Admitted for obs/workup 05/2015.  [name] Venous insufficiency   . Venous reflux 10/14/2011   venous doppler-R GSV continuous reflux throughout; too small for VNUS closure   Past Surgical History:  Procedure Laterality Date  . AORTIC VALVE REPLACEMENT N/A 07/09/2015   Pericardial valve.  Procedure:  AORTIC VALVE REPLACEMENT (AVR);  Surgeon: Loreli Slot, MD;  Location: [facility] OR;  Service: Open Heart Surgery;  Laterality: N/A;  . CARDIAC CATHETERIZATION  10/2011   EF normal.  Diffuse 3 vessel CAD, no stents placed.  Medical mgmt per [facility].  [name] CARDIAC CATHETERIZATION N/A 06/22/2015   Procedure: Right/Left Heart Cath and Coronary Angiography;  Surgeon: Runell Gess, MD;  Location: [facility] INVASIVE CV LAB;  Service: Cardiovascular;  Laterality: N/A;  . carotid dopplers  06/30/15   Normal: repeat when clinically indicated per Dr. [name]  . CAROTID STENT INSERTION Right 01/16/2014    Procedure: CAROTID STENT INSERTION;  Surgeon: Runell Gess, MD;  Location: [facility] CATH LAB;  Service: Cardiovascular;  Laterality: Right;  . CATARACT EXTRACTION     left  . CORONARY ARTERY BYPASS GRAFT N/A 07/09/2015   Procedure: CORONARY ARTERY BYPASS GRAFTING (CABG)TIMES THREE USING LEFT INTERNAL MAMMARY ARTERY AND LEFT SAPHENOUS LEG VEIN HARVESTED ENDOSCOPICALLY;  Surgeon: Loreli Slot, MD;  Location: [facility] OR;  Service: Open Heart Surgery;  Laterality: N/A;  . EYE SURGERY     Multiple laser surgeries for diabetic retinopathy, also cataract surgery OU.  [name] INTRAOPERATIVE TRANSESOPHAGEAL ECHOCARDIOGRAM N/A 07/09/2015   Procedure: INTRAOPERATIVE TRANSESOPHAGEAL ECHOCARDIOGRAM;  Surgeon: Loreli Slot, MD;  Location: [facility] OR;  Service: Open Heart Surgery;  Laterality: N/A;  . lasix eye    . LEFT AND RIGHT HEART CATHETERIZATION WITH CORONARY ANGIOGRAM N/A 10/28/2011   Procedure: LEFT AND R0IHT HEART CATHETERIZATION WITH CORONARY ANGIOGRAM;  Surgeon: Lennette Bihari, MD;  Location: [facility] CATH LAB;  Service: Cardiovascular;  Laterality: N/A;  . RETINAL DETACHMENT SURGERY     Right eye  . THYROID SURGERY  2002  . TRANSTHORACIC ECHOCARDIOGRAM  10/2011;12/2012;01/2015   Mod AS, mild LVH, EF 60-65%, no RWMA.  01/2015 EF 60-65%, grade I DD, progression of mod/sev AS.    Allergies  Allergies  Allergen Reactions  . Rosuvastatin Calcium Other (See Comments)    Problem with higher dosages (Lipitor caused memory issues), also caused leg pains and lethargy  . Statins Other (See Comments)    Problem with higher dosages (Lipitor caused memory issues), also caused leg pains and lethargy  . Tape Rash and Other (See Comments)    Adhesive tape.  (Paper tape OK)    History of Present Illness    65 year old male with the above complex past medical history. He has a history of diabetes, sleep apnea, coronary artery disease, and aortic stenosis. He also has a history of TIA with finding of moderate right  internal carotid artery stenosis status post stenting in January 2016. Over the past few years, his aortic stenosis had progressed and earlier this year, he was noted to have a peak gradient of 52 mmHg. He underwent right and left heart catheterization in June revealing severe 3 vessel coronary artery disease along with moderate aortic stenosis and a peak gradient of 33 mmHg the valve area of 1.09 cm. He was evaluated by thoracic surgery and it was felt that he was a suitable candidate for surgical aortic valve replacement along with coronary artery bypass grafting. This took place on July 6. His hospital course was relatively uneventful and he was subsequently discharged on July 11. On July 17, he presented back to the emergency department with dyspnea and lower extremity swelling. A CT of his chest was performed and was negative for PE or pericardial effusion. He was given Lasix in the ED and discharged home. Of note, his  creatinine was elevated above a baseline at 1.3 with a potassium of 5.5 that day. Since then, he has had no additional significant dyspnea on exertion or lower extremity swelling. He has been walking around his house on daily basis for about 30 minutes without symptoms or limitations. He does have some chest wall discomfort related to surgical incision. His bilateral leg incisions have been healing well and have not been bothering him. He denies PND, orthopnea, dizziness, syncope, or early satiety. His appetite has been good. He saw Dr. Dorris Fetch on July 26, at which time he complained of dizziness. His blood pressure was low and his metoprolol, Lasix, potassium, and iron were discontinued. He has felt better since that visit.  Home Medications    Prior to Admission medications   Medication Sig Start Date End Date Taking? Authorizing Provider  aspirin EC 81 MG tablet Take 81 mg by mouth daily.   Yes Historical Provider, MD  clopidogrel (PLAVIX) 75 MG tablet Take 1 tablet (75 mg total)  by mouth daily. 03/16/15  Yes Runell Gess, MD  clotrimazole-betamethasone (LOTRISONE) lotion Apply 1 application topically daily as needed (rash).   Yes Historical Provider, MD  FEVERFEW PO Take 1 capsule by mouth daily. To prevent migraines   Yes Historical Provider, MD  folic acid (FOLVITE) 1 MG tablet Take 1 tablet (1 mg total) by mouth daily. 07/16/15  Yes Loreli Slot, MD  Insulin Human (INSULIN PUMP) SOLN Inject into the skin continuous. Basal rate .95/hr, current pump settings: 12am .7, 2am 1, 7am 1.1, 11am .9, 5pm .9.25, 11pm .85. Uses Novolog Insulin.   Yes Historical Provider, MD  levothyroxine (SYNTHROID, LEVOTHROID) 137 MCG tablet TAKE ONE TABLET BY MOUTH ONCE DAILY 07/01/15  Yes [name], MD  lisinopril (PRINIVIL,ZESTRIL) 10 MG tablet Take 1 tablet (10 mg total) by mouth daily. 07/14/15  Yes Wayne E Gold, PA-C  Multiple Vitamins-Minerals (ONE-A-DAY MENS 50+ ADVANTAGE) TABS Take 1 tablet by mouth daily with breakfast.   Yes Historical Provider, MD  oxyCODONE (OXY IR/ROXICODONE) 5 MG immediate release tablet Take 1-2 tablets (5-10 mg total) by mouth every 6 (six) hours as needed for moderate pain, severe pain or breakthrough pain. 07/14/15  Yes Wayne E Gold, PA-C  ranitidine (ZANTAC) 150 MG tablet Take 150 mg by mouth at bedtime as needed for heartburn.    Yes Historical Provider, MD  tadalafil (CIALIS) 20 MG tablet Take 1 tablet (20 mg total) by mouth daily as needed for erectile dysfunction. 10/03/14  Yes Carlus Pavlov, MD    Review of Systems    As above, he has been feeling better. He was having some fatigue and dizziness that has resolved since discontinuation of metoprolol and Lasix. He denies dyspnea, PND, orthopnea, palpitations, dizziness, syncope, edema, or early satiety.  All other systems reviewed and are otherwise negative except as noted above.  Physical Exam    VS:  BP (!) 115/52   Pulse 93   Ht 6\' 2"  (1.88 m)   Wt 215 lb (97.5 kg)   BMI 27.60 kg/m   , BMI Body mass index is 27.6 kg/m. GEN: Well nourished, well developed, in no acute distress.  HEENT: normal.  Neck: Supple, no JVD. Soft bilateral carotid bruits, no masses. Cardiac: RRR, no murmurs, rubs, or gallops. No clubbing, cyanosis, edema.  Radials/DP/PT 2+ and equal bilaterally. Midline sternal incision and bilateral lower extremity incision sites are healing well. Respiratory:  Respirations regular and unlabored, clear to auscultation bilaterally. GI: Soft, nontender,  nondistended, BS + x 4. MS: no deformity or atrophy. Skin: warm and dry, no rash. Neuro:  Strength and sensation are intact. Psych: Normal affect.  Accessory Clinical Findings    ECG - Regular sinus rhythm, 93, left axis deviation, nonspecific ST and T changes. No acute changes.  Assessment & Plan    1.  Aortic stenosis: Status post recent bioprosthetic aortic valve replacement. He also underwent coronary artery bypass grafting at the time. He is artery followed up once with CT surgery and has follow-up again next week. Surgical incisions are healing well. He had been expressing fatigue and dizziness in the setting of hypotension, however this has resolved since his beta blocker and diuretic have been discontinued. We will arrange for a follow-up 2-D echocardiogram to assess his valve in the postoperative state.  2. Coronary artery disease: Status post coronary artery bypass grafting 3 at the time of his aortic valve replacement. He has some incisional chest wall pain but denies angina or dyspnea. He is walking about 30 minutes a day around his house without significant limitations. He remains on aspirin, Plavix, and ACE inhibitor therapy. He is not on a statin due to prior intolerance and he is working through our pharmacist to try and obtain a PCSK9 inhibitor.  He is interested in cardiac rehab and plans to enroll. Of note, he has noted heart rates running in the 90s to 100s. He is in sinus rhythm. He was hypotensive  on metoprolol and says that he felt quite poorly on it with memory deficits and inability to think. He is not interested in trying an alternate beta blocker at this time.  3.  Hyperlipidemia: LDL was 186 in May. He is intolerant of statin secondary to memory issues and also did not tolerate Zetia. As above, he is working through our pharmacy team to obtain a PCSK9 inhibitor.  4. Carotid arterial disease: Status post prior carotid stenting. He remains on aspirin and Plavix.  5. Hyperkalemia: Potassium was elevated when he was seen in the ED on July 17. He was on potassium supplementation at that time. He is also on an ACE inhibitor. I will follow-up a basic metabolic panel today.   6. Disposition: He plans to enroll in cardiac rehabilitation once cleared by surgery. I will follow-up a basic metabolic panel today and also arrange for follow-up echo. Follow-up with Dr. [name] as scheduled for September.  Nicolasa Ducking, NP 08/06/2015, 4:44 PM

## 2015-08-06 NOTE — Patient Instructions (Signed)
Medication Instructions: Ward Givens, NP, recommends that you continue on your current medications as directed. Please refer to the Current Medication list given to you today.  Labwork: Your physician recommends that you return for lab work TODAY.  Testing/Procedures: 1. Echocardiogram - Your physician has requested that you have an echocardiogram. Echocardiography is a painless test that uses sound waves to create images of your heart. It provides your doctor with information about the size and shape of your heart and how well your heart's chambers and valves are working. This procedure takes approximately one hour. There are no restrictions for this procedure. This will be done at our [facility] location - [address].  Follow-up: Follow-up with Dr [name] as scheduled.  If you need a refill on your cardiac medications before your next appointment, please call your pharmacy.`

## 2015-08-07 ENCOUNTER — Telehealth: Payer: Self-pay | Admitting: *Deleted

## 2015-08-07 DIAGNOSIS — E875 Hyperkalemia: Secondary | ICD-10-CM

## 2015-08-07 LAB — BASIC METABOLIC PANEL
BUN: 23 mg/dL (ref 7–25)
CALCIUM: 9.1 mg/dL (ref 8.6–10.3)
CO2: 25 mmol/L (ref 20–31)
CREATININE: 1.13 mg/dL (ref 0.70–1.25)
Chloride: 101 mmol/L (ref 98–110)
Glucose, Bld: 182 mg/dL — ABNORMAL HIGH (ref 65–99)
Potassium: 5.7 mmol/L — ABNORMAL HIGH (ref 3.5–5.3)
Sodium: 138 mmol/L (ref 135–146)

## 2015-08-07 NOTE — Telephone Encounter (Signed)
Pt aware of lab results.  He stated that his potassium ran out 14 days after his sx and he hasn't taken anymore since. He will d/c Lisinopril and repeat bmet 08/10/15. Order put in EPIC for [facility] per pt request. Pt agreeable with this plan.

## 2015-08-07 NOTE — Telephone Encounter (Signed)
-----   Message from Ok Anis, NP sent at 08/07/2015  7:51 AM EDT ----- His potassium remains elevated.  Please ensure that he is no longer taking KCl supplementation and have him discontinue lisinopril.  Please arrange for a  f/u bmet on Monday.

## 2015-08-10 ENCOUNTER — Other Ambulatory Visit: Payer: Self-pay

## 2015-08-10 ENCOUNTER — Telehealth: Payer: Self-pay | Admitting: Cardiovascular Disease

## 2015-08-10 ENCOUNTER — Encounter: Payer: Self-pay | Admitting: Thoracic Surgery (Cardiothoracic Vascular Surgery)

## 2015-08-10 ENCOUNTER — Ambulatory Visit (INDEPENDENT_AMBULATORY_CARE_PROVIDER_SITE_OTHER): Payer: Self-pay | Admitting: Thoracic Surgery (Cardiothoracic Vascular Surgery)

## 2015-08-10 VITALS — BP 133/78 | HR 97 | Resp 16 | Ht 74.0 in | Wt 215.0 lb

## 2015-08-10 DIAGNOSIS — E875 Hyperkalemia: Secondary | ICD-10-CM

## 2015-08-10 DIAGNOSIS — Z951 Presence of aortocoronary bypass graft: Secondary | ICD-10-CM

## 2015-08-10 DIAGNOSIS — I2510 Atherosclerotic heart disease of native coronary artery without angina pectoris: Secondary | ICD-10-CM

## 2015-08-10 DIAGNOSIS — Z952 Presence of prosthetic heart valve: Secondary | ICD-10-CM

## 2015-08-10 DIAGNOSIS — Z954 Presence of other heart-valve replacement: Secondary | ICD-10-CM

## 2015-08-10 NOTE — Progress Notes (Signed)
[address]       [place] [address]             [phone]       HPI: Mr. Karner returns today for follow-up after aortic valve replacement and coronary bypass grafting.  Mr. 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 His postoperative course was notable for elevated blood sugars. He was discharged on postoperative day #5.  He presented back to the office on 07/20/2015 with complaints of shortness of breath. He was sent to the emergency room. His EKG showed no significant changes and a CT angiogram showed no evidence of pulmonary embolus. He did have small bilateral pleural effusions. There was no significant pericardial effusion. He was also treated with Keflex for a possible wound infection of the left leg.  I saw him in the office on 07/29/2015. He was feeling poorly at that time. He complained of his blood sugars inconsistently over 200. He was dizzy and felt "cloudy in the head." His blood pressure was low and he was a little bradycardic. I stopped his Lasix, potassium, iron, and metoprolol. He also stopped taking his lisinopril.  Since that visit he has improved significantly. He says he is starting to feel more like himself. He is sleeping better. His appetite is improved. He hasn't had to take a pain pill in about a week. He mowed his yard yesterday on a riding mower. He is anxious to start cardiac rehabilitation.  Past Medical History:  Diagnosis Date  . Abnormal stress test 10/27/2011   lat isch--cardiac cath 10/28/2011  . Aortic stenosis, moderate Sept/Oct /2013   a. Mild/mod by echo; mild by cath 10/28/11; b. Mild progression by echo 05/2012; c. Further mild progression on 01/2014 echo (valve area 0.93 cm2).  d. Further progression on echo 01/2015; e. 07/2015 s/p [facility] Ease bovine  pericardial valve model 3300 TFX, ser # [id number].  . 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 Inferolateral perfusion defect + EKG abnormality during stress testing prompted Cath by [facility]: 3V CAD, EF normal.  Med mgmt recommended; b. 06/2015 Cath: 3VD and mod AS; c. 07/2015 CABG x 3 (LIMA->LAD, VG->OM2, VG->PDA) w/ AVR.  [name] Carotid stenosis, right 09/2011   Dr. [name] did R carotid stenting 01/2014.  Carotid dopplers 06/2015 essentially normal.  . Complication of anesthesia    difficulty with intubation,   . Decreased pedal pulses    LE doppler 11/08/11- no evidence of arterial insufficiency  . Diabetes mellitus Dx'd age 21   Novolog via insulin pump; sub-optimal control x years  . Diabetic neuropathy (HCC)    fine touch and position sense affected  . Diabetic retinopathy    Proliferative: Hx of retinal detachment on right-light perception only in right eye  . Diastolic dysfunction 2007; 2016   TEE with diastolic dysfunction, EF 74%.  . Difficult intubation    ~ 2002 difficult fiberoptic intubation with takeback bleeding s/p thyroidectomy;  for thyroidectomy was intubated DL X 1 with cricoid pressure but difficult mask (full beard)  . Dizziness    feels off balance  . Erectile dysfunction    [place]  . GERD (gastroesophageal reflux disease)   . High cholesterol    a. intolerant of statins/zeta.  . History of tobacco abuse    Quit about 1990 (has 35  pack-yr hx)  . Hypothyroidism    Thyroidectomy 2002; multinodular goiter  . Impaired vision    right eye light perception only; hx of retinal detachment.  . Impingement syndrome of right shoulder   . Neuromuscular disorder (HCC)    neuropathy  . OSA (obstructive sleep apnea)    06/2011 sleep study: moderate OSA, CPAP at 12 cm H2O.  . Osteoarthritis    Bilat thumb carpometacarpal joints.  . Recurrent pneumonia   . Shortness of breath dyspnea   . Stroke [facility](HCC)    TIA  . TIA (transient ischemic attack) 2015/16   with R carotid dz: pt  got R carotid stent 01/2014 by Dr. Allyson SabalBerry and was put on dual antiplatelet therapy with ASA 325mg  and plavix 75mg  qd afterwards  . TIA (transient ischemic attack) 05/2015   Left brain (right sided numbness + slurred speech)  Admitted for obs/workup 05/2015.  [name]. Venous insufficiency   . Venous reflux 10/14/2011   venous doppler-R GSV continuous reflux throughout; too small for VNUS closure       Current Outpatient Prescriptions  Medication Sig Dispense Refill  . aspirin EC 81 MG tablet Take 81 mg by mouth daily.    . clopidogrel (PLAVIX) 75 MG tablet Take 1 tablet (75 mg total) by mouth daily. 90 tablet 3  . clotrimazole-betamethasone (LOTRISONE) lotion Apply 1 application topically daily as needed (rash).    . FEVERFEW PO Take 1 capsule by mouth daily. To prevent migraines    . folic acid (FOLVITE) 1 MG tablet Take 1 tablet (1 mg total) by mouth daily. 30 tablet 1  . Insulin Human (INSULIN PUMP) SOLN Inject into the skin continuous. Basal rate .95/hr, current pump settings: 12am .7, 2am 1, 7am 1.1, 11am .9, 5pm .9.25, 11pm .85. Uses Novolog Insulin.    [name]. levothyroxine (SYNTHROID, LEVOTHROID) 137 MCG tablet TAKE ONE TABLET BY MOUTH ONCE DAILY 90 tablet 1  . Multiple Vitamins-Minerals (ONE-A-DAY MENS 50+ ADVANTAGE) TABS Take 1 tablet by mouth daily with breakfast.    . oxyCODONE (OXY IR/ROXICODONE) 5 MG immediate release tablet Take 1-2 tablets (5-10 mg total) by mouth every 6 (six) hours as needed for moderate pain, severe pain or breakthrough pain. 30 tablet 0  . ranitidine (ZANTAC) 150 MG tablet Take 150 mg by mouth at bedtime as needed for heartburn.     . tadalafil (CIALIS) 20 MG tablet Take 1 tablet (20 mg total) by mouth daily as needed for erectile dysfunction. 10 tablet 1  . lisinopril (PRINIVIL,ZESTRIL) 10 MG tablet Take 1 tablet (10 mg total) by mouth daily. (Patient not taking: Reported on 08/10/2015) 30 tablet 1   No current facility-administered medications for this visit.      Physical Exam BP 133/78   Pulse 97   Resp 16   Ht 6\' 2"  (1.88 m)   Wt 215 lb (97.5 kg)   SpO2 98% Comment: ON RA  BMI 27.6960 kg/m  65 year old man in no acute distress Alert and oriented 3 with no focal deficits Lungs clear with a breast is bilaterally Sternum stable, incision clean dry and intact Cardiac regular rate and rhythm normal S1 and S2 Leg incisions healing well Small hematoma or seroma left leg incision at level of the  Diagnostic Tests: BMET pending  Impression: 65 year old man who had aortic valve replacement and coronary bypass grafting 3 about a month ago. He was having a lot of issues couple of weeks ago and we stopped his metoprolol, Lasix, potassium, and iron. He  also stopped taking his lisinopril. Since then he has improved dramatically. Some of that may be medication related, and some of it is likely just having more time for recovery from surgery. In any event with him doing so well at this point in time I did not restart any of his medications. I suspect that Dr. [name] will want to restart his lisinopril at some point. But, I think that decision should wait until after his labs are done.  He does have a small seroma or hematoma at the left knee incision. There is no tenderness or induration. There is some mild erythema. I don't think there is any active infection. He will keep an eye on that area and if it becomes more swollen or tender he will let me know right away.  He wants to start cardiac rehabilitation. I think he should be okay to do that within the next couple of weeks. I'll have my office contact cardiac rehabilitation team.  I think he is okay to drive on a limited basis. Appropriate precautions were discussed. He is not to lift anything over 10 pounds for the next 2 weeks.  Plan: Follow-up with Dr. [name].  I will be happy to see him back any time if I can be of any further assistance with his care.  Loreli Slot, MD [facility] [phone]

## 2015-08-10 NOTE — Addendum Note (Signed)
Addended by: Rowland LatheWRIGHT, Dessirae Scarola R on: 08/10/2015 05:15 PM   Modules accepted: Orders

## 2015-08-10 NOTE — Telephone Encounter (Signed)
[place] [facility]. She still could not see the order in the system.  Patient already planning to come back to that location to get labs. Order reentered so she could view it. This was successful.

## 2015-08-10 NOTE — Telephone Encounter (Signed)
Returned call to [place] at [facility]. Patient does need BMET with lab draw. Order in EPIC needed to be released. Order released. Patient will be back in after an appt to get his labs drawn.

## 2015-08-10 NOTE — Telephone Encounter (Signed)
New Message  Wesly from [facility] is calling because labs are only showing Lipid and Liver.  [facility], his Potassium was high and he was told to come back and get it redone.  There's no lab order for Potassium but only for Lipid and Liver.  Please follow up

## 2015-08-10 NOTE — Addendum Note (Signed)
Addended by: Stann MainlandLARK, JENNIFER O on: 08/10/2015 03:41 PM   Modules accepted: Orders

## 2015-08-11 ENCOUNTER — Telehealth: Payer: Self-pay | Admitting: Cardiovascular Disease

## 2015-08-11 DIAGNOSIS — E875 Hyperkalemia: Secondary | ICD-10-CM

## 2015-08-11 DIAGNOSIS — Z79899 Other long term (current) drug therapy: Secondary | ICD-10-CM

## 2015-08-11 LAB — BASIC METABOLIC PANEL
BUN: 17 mg/dL (ref 7–25)
CALCIUM: 8.8 mg/dL (ref 8.6–10.3)
CO2: 26 mmol/L (ref 20–31)
CREATININE: 1 mg/dL (ref 0.70–1.25)
Chloride: 103 mmol/L (ref 98–110)
Glucose, Bld: 157 mg/dL — ABNORMAL HIGH (ref 65–99)
Potassium: 5.3 mmol/L (ref 3.5–5.3)
Sodium: 139 mmol/L (ref 135–146)

## 2015-08-11 NOTE — Telephone Encounter (Signed)
F/u message ° °Pt returning RN call. Please call back to discuss  °

## 2015-08-11 NOTE — Telephone Encounter (Signed)
Returned call to patient. Gave him results as follows: Notes Recorded by Ok Anishristopher R Berge, NP on 08/11/2015 at 12:51 PM EDT K better. Remain off of lisinopril. F/u bmet in 1 wk to ensure stability.  Patient verbalized understanding and will come to [facility] building to next Monday for repeat labs. He also verbalized understanding to remain off his lisinopril.

## 2015-08-12 NOTE — Progress Notes (Signed)
Thx Steve  JJB 

## 2015-08-17 ENCOUNTER — Encounter: Payer: Self-pay | Admitting: Family Medicine

## 2015-08-19 ENCOUNTER — Other Ambulatory Visit: Payer: Self-pay

## 2015-08-19 ENCOUNTER — Ambulatory Visit (HOSPITAL_COMMUNITY): Payer: PRIVATE HEALTH INSURANCE | Attending: Cardiology

## 2015-08-19 DIAGNOSIS — Z954 Presence of other heart-valve replacement: Secondary | ICD-10-CM | POA: Diagnosis not present

## 2015-08-19 DIAGNOSIS — I2510 Atherosclerotic heart disease of native coronary artery without angina pectoris: Secondary | ICD-10-CM | POA: Diagnosis not present

## 2015-08-19 DIAGNOSIS — I517 Cardiomegaly: Secondary | ICD-10-CM | POA: Diagnosis not present

## 2015-08-19 DIAGNOSIS — I371 Nonrheumatic pulmonary valve insufficiency: Secondary | ICD-10-CM | POA: Insufficient documentation

## 2015-08-19 DIAGNOSIS — Z953 Presence of xenogenic heart valve: Secondary | ICD-10-CM | POA: Insufficient documentation

## 2015-08-19 DIAGNOSIS — E119 Type 2 diabetes mellitus without complications: Secondary | ICD-10-CM | POA: Insufficient documentation

## 2015-08-19 DIAGNOSIS — I340 Nonrheumatic mitral (valve) insufficiency: Secondary | ICD-10-CM | POA: Diagnosis not present

## 2015-08-19 DIAGNOSIS — I350 Nonrheumatic aortic (valve) stenosis: Secondary | ICD-10-CM | POA: Insufficient documentation

## 2015-08-19 DIAGNOSIS — I071 Rheumatic tricuspid insufficiency: Secondary | ICD-10-CM | POA: Insufficient documentation

## 2015-08-19 DIAGNOSIS — Z952 Presence of prosthetic heart valve: Secondary | ICD-10-CM

## 2015-08-20 ENCOUNTER — Encounter: Payer: Self-pay | Admitting: Internal Medicine

## 2015-08-27 ENCOUNTER — Ambulatory Visit (INDEPENDENT_AMBULATORY_CARE_PROVIDER_SITE_OTHER): Payer: PRIVATE HEALTH INSURANCE | Admitting: Family Medicine

## 2015-08-27 ENCOUNTER — Encounter: Payer: Self-pay | Admitting: Family Medicine

## 2015-08-27 VITALS — BP 132/82 | HR 90 | Temp 98.0°F | Resp 16 | Ht 74.0 in | Wt 216.8 lb

## 2015-08-27 DIAGNOSIS — J209 Acute bronchitis, unspecified: Secondary | ICD-10-CM

## 2015-08-27 MED ORDER — PREDNISONE 20 MG PO TABS: ORAL_TABLET | ORAL | 0 refills | Status: DC

## 2015-08-27 NOTE — Progress Notes (Signed)
Pre visit review using our clinic review tool, if applicable. No additional management support is needed unless otherwise documented below in the visit note. 

## 2015-08-27 NOTE — Progress Notes (Signed)
OFFICE VISIT  08/27/2015   CC:  Chief Complaint  Patient presents with  . Cough    since cardiac surgery 07/2015   HPI:    Patient is a [age] y.o. Caucasian male who presents for cough. He has had aortic valve replacement and CABG since I last saw him (07/09/15). He has 35 pack-yr tobacco hx, quit in 1990.  Has had cough since the surgery (about 7 weeks) , describes infrequent but daily, slightly productive cough/rattly.  No chest pain. No prolonged "fits" of coughing.  Denies feeling of anything in back of throat such as PND or a tickle sensation.  When he does his incentive spirometry he notes it more, also sometimes more after he eats. Sometimes after walking up steps he feels need to cough more.  No fevers. Has some nasal mucous--has to blow his nose 2-3 times per day.  Has chronic mild DOE since before his cardiac surgery and this has actually improved some the last few weeks.   Past Medical History:  Diagnosis Date  . Abnormal stress test 10/27/2011   lat isch--cardiac cath 10/28/2011  . Aortic stenosis, moderate Sept/Oct /2013   a. Mild/mod by echo; mild by cath 10/28/11; b. Mild progression by echo 05/2012; c. Further mild progression on 01/2014 echo (valve area 0.93 cm2).  d. Further progression on echo 01/2015; e. 07/2015 s/p [facility] Ease bovine pericardial valve model 3300 TFX, ser # [id number].  . 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 Inferolateral perfusion defect + EKG abnormality during stress testing prompted Cath by [facility]: 3V CAD, EF normal.  Med mgmt recommended; b. 06/2015 Cath: 3VD and mod AS; c. 07/2015 CABG x 3 (LIMA->LAD, VG->OM2, VG->PDA) w/ AVR.  [name]. Carotid stenosis, right 09/2011   Dr. Allyson SabalBerry did R carotid stenting 01/2014.  Carotid dopplers 06/2015 essentially normal.  . Complication of anesthesia    difficulty with intubation,   . Decreased pedal pulses    LE doppler 11/08/11- no evidence of arterial insufficiency  . Diabetes mellitus Dx'd age 65   Novolog via insulin  pump; sub-optimal control x years  . Diabetic neuropathy (HCC)    fine touch and position sense affected  . Diabetic retinopathy    Proliferative: Hx of retinal detachment on right-light perception only in right eye  . Diastolic dysfunction 2007; 2016   TEE with diastolic dysfunction, EF 74%.  . Difficult intubation    ~ 2002 difficult fiberoptic intubation with takeback bleeding s/p thyroidectomy;  for thyroidectomy was intubated DL X 1 with cricoid pressure but difficult mask (full beard)  . Dizziness    feels off balance  . Erectile dysfunction    [place]  . GERD (gastroesophageal reflux disease)   . High cholesterol    a. intolerant of statins/zeta.  . History of tobacco abuse    Quit about 1990 (has 35 pack-yr hx)  . Hypothyroidism    Thyroidectomy 2002; multinodular goiter  . Impaired vision    right eye light perception only; hx of retinal detachment.  . Impingement syndrome of right shoulder   . Neuromuscular disorder (HCC)    neuropathy  . OSA (obstructive sleep apnea)    06/2011 sleep study: moderate OSA, CPAP at 12 cm H2O.  . Osteoarthritis    Bilat thumb carpometacarpal joints.  . Recurrent pneumonia   . Shortness of breath dyspnea   . Stroke [facility](HCC)    TIA  . TIA (transient ischemic attack) 2015/16   with R carotid dz:  pt got R carotid stent 01/2014 by Dr. Allyson SabalBerry and was put on dual antiplatelet therapy with ASA 325mg  and plavix 75mg  qd afterwards  . TIA (transient ischemic attack) 05/2015   Left brain (right sided numbness + slurred speech)  Admitted for obs/workup 05/2015.  [name]. Venous insufficiency   . Venous reflux 10/14/2011   venous doppler-R GSV continuous reflux throughout; too small for VNUS closure    Past Surgical History:  Procedure Laterality Date  . AORTIC VALVE REPLACEMENT N/A 07/09/2015   Pericardial valve.  Procedure: AORTIC VALVE REPLACEMENT (AVR);  Surgeon: Loreli SlotSteven C Hendrickson, MD;  [facility];  Service: Open Heart Surgery;   Laterality: N/A;  . CARDIAC CATHETERIZATION  10/2011   EF normal.  Diffuse 3 vessel CAD, no stents placed.  Medical mgmt per [facility].  [name]. CARDIAC CATHETERIZATION N/A 06/22/2015   Procedure: Right/Left Heart Cath and Coronary Angiography;  Surgeon: Runell GessJonathan J Berry, MD;  [facility];  Service: Cardiovascular;  Laterality: N/A;  . carotid dopplers  06/30/15   Normal: repeat when clinically indicated per Dr. Allyson SabalBerry  . CAROTID STENT INSERTION Right 01/16/2014   Procedure: CAROTID STENT INSERTION;  Surgeon: Runell GessJonathan J Berry, MD;  [facility];  Service: Cardiovascular;  Laterality: Right;  . CATARACT EXTRACTION     left  . CORONARY ARTERY BYPASS GRAFT N/A 07/09/2015   Procedure: CORONARY ARTERY BYPASS GRAFTING (CABG)TIMES THREE USING LEFT INTERNAL MAMMARY ARTERY AND LEFT SAPHENOUS LEG VEIN HARVESTED ENDOSCOPICALLY;  Surgeon: Loreli SlotSteven C Hendrickson, MD;  [facility];  Service: Open Heart Surgery;  Laterality: N/A;  . EYE SURGERY     Multiple laser surgeries for diabetic retinopathy, also cataract surgery OU.  [name]. INTRAOPERATIVE TRANSESOPHAGEAL ECHOCARDIOGRAM N/A 07/09/2015   Procedure: INTRAOPERATIVE TRANSESOPHAGEAL ECHOCARDIOGRAM;  Surgeon: Loreli SlotSteven C Hendrickson, MD;  [facility];  Service: Open Heart Surgery;  Laterality: N/A;  . lasix eye    . LEFT AND RIGHT HEART CATHETERIZATION WITH CORONARY ANGIOGRAM N/A 10/28/2011   Procedure: LEFT AND R0IHT HEART CATHETERIZATION WITH CORONARY ANGIOGRAM;  Surgeon: Lennette Biharihomas A Kelly, MD;  [facility];  Service: Cardiovascular;  Laterality: N/A;  . RETINAL DETACHMENT SURGERY     Right eye  . THYROID SURGERY  2002  . TRANSTHORACIC ECHOCARDIOGRAM  10/2011;12/2012;01/2015   Mod AS, mild LVH, EF 60-65%, no RWMA.  01/2015 EF 60-65%, grade I DD, progression of mod/sev AS.    Outpatient Medications Prior to Visit  Medication Sig Dispense Refill  . aspirin EC 81 MG tablet Take 81 mg by mouth daily.    . clopidogrel (PLAVIX) 75 MG tablet  Take 1 tablet (75 mg total) by mouth daily. 90 tablet 3  . clotrimazole-betamethasone (LOTRISONE) lotion Apply 1 application topically daily as needed (rash).    . FEVERFEW PO Take 1 capsule by mouth daily. To prevent migraines    . Insulin Human (INSULIN PUMP) SOLN Inject into the skin continuous. Basal rate .95/hr, current pump settings: 12am .7, 2am 1, 7am 1.1, 11am .9, 5pm .9.25, 11pm .85. Uses Novolog Insulin.    [name]. levothyroxine (SYNTHROID, LEVOTHROID) 137 MCG tablet TAKE ONE TABLET BY MOUTH ONCE DAILY 90 tablet 1  . Multiple Vitamins-Minerals (ONE-A-DAY MENS 50+ ADVANTAGE) TABS Take 1 tablet by mouth daily with breakfast.    . ranitidine (ZANTAC) 150 MG tablet Take 150 mg by mouth at bedtime as needed for heartburn.     . tadalafil (CIALIS) 20 MG tablet Take 1 tablet (20 mg total) by mouth daily  as needed for erectile dysfunction. 10 tablet 1  . folic acid (FOLVITE) 1 MG tablet Take 1 tablet (1 mg total) by mouth daily. (Patient not taking: Reported on 08/27/2015) 30 tablet 1  . lisinopril (PRINIVIL,ZESTRIL) 10 MG tablet Take 1 tablet (10 mg total) by mouth daily. (Patient not taking: Reported on 08/10/2015) 30 tablet 1  . oxyCODONE (OXY IR/ROXICODONE) 5 MG immediate release tablet Take 1-2 tablets (5-10 mg total) by mouth every 6 (six) hours as needed for moderate pain, severe pain or breakthrough pain. (Patient not taking: Reported on 08/27/2015) 30 tablet 0   No facility-administered medications prior to visit.     Allergies  Allergen Reactions  . Rosuvastatin Calcium Other (See Comments)    Problem with higher dosages (Lipitor caused memory issues), also caused leg pains and lethargy  . Statins Other (See Comments)    Problem with higher dosages (Lipitor caused memory issues), also caused leg pains and lethargy  . Tape Rash and Other (See Comments)    Adhesive tape.  (Paper tape OK)    ROS As per HPI  PE: Blood pressure 132/82, pulse 90, temperature 98 F (36.7 C), temperature  source Oral, resp. rate 16, height 6\' 2"  (1.88 m), weight 216 lb 12 oz (98.3 kg), SpO2 95 %. Gen: Alert, well appearing.  Patient is oriented to person, place, time, and situation. AFFECT: pleasant, lucid thought and speech. ENT: Ears: EACs clear, normal epithelium.  TMs with good light reflex and landmarks bilaterally.  Eyes: no injection, icteris, swelling, or exudate.  EOMI, PERRLA. Nose: no drainage or turbinate edema/swelling.  No injection or focal lesion.  Mouth: lips without lesion/swelling.  Oral mucosa pink and moist.  Dentition intact and without obvious caries or gingival swelling.  Oropharynx without erythema, exudate, or swelling.  Neck - No masses or thyromegaly or limitation in range of motion CV: RRR, no m/r/g.   LUNGS: CTA bilat, nonlabored resps, good aeration in all lung fields.  LABS:  None today  IMPRESSION AND PLAN:  Mild acute bronchitis, slightly prolonged course. No sign of bacterial infection. Prednisone 40mg  qd x 5d rx'd today. Robitussin DM or mucinex DM OTC recommended prn.  If not signif improved after this then we'll consider CXR and/or pulm eval.  An After Visit Summary was printed and given to the patient.  FOLLOW UP: Return if symptoms worsen or fail to improve.  Signed:  Santiago Bumpers, MD           08/27/2015

## 2015-08-28 ENCOUNTER — Encounter: Payer: Self-pay | Admitting: Family Medicine

## 2015-09-08 ENCOUNTER — Encounter (HOSPITAL_COMMUNITY): Payer: Self-pay

## 2015-09-11 ENCOUNTER — Ambulatory Visit (INDEPENDENT_AMBULATORY_CARE_PROVIDER_SITE_OTHER): Payer: PRIVATE HEALTH INSURANCE | Admitting: Internal Medicine

## 2015-09-11 ENCOUNTER — Encounter: Payer: Self-pay | Admitting: Internal Medicine

## 2015-09-11 VITALS — BP 128/82 | HR 86 | Ht 73.0 in | Wt 217.0 lb

## 2015-09-11 DIAGNOSIS — E1065 Type 1 diabetes mellitus with hyperglycemia: Secondary | ICD-10-CM

## 2015-09-11 DIAGNOSIS — E1059 Type 1 diabetes mellitus with other circulatory complications: Secondary | ICD-10-CM | POA: Diagnosis not present

## 2015-09-11 LAB — POCT GLYCOSYLATED HEMOGLOBIN (HGB A1C): Hemoglobin A1C: 8

## 2015-09-11 NOTE — Progress Notes (Signed)
Patient ID: [name], male   DOB: 1950/02/15, 65 y.o.   MRN: [id number]  HPI: [name] is a 65 y.o.-year-old male, returning for f/u for DM1, dx 22 (65 y/o), uncontrolled, with complications (CAD, proliferative diabetic retinopathy-history of retinal detachment OD, right carotid stenosis, cerebrovascular disease - s/p TIA 01/2014 and 05/2015, PVD, peripheral neuropathy, ED). Last visit 3 mo ago.  Since last visit, he had a triple CABG with Aortic valve replacement. He feels better. He will start cardiac rehab 3x a week  - 6:45 am. He restarts work 10/05/2015.   He had a Prednisone taper for cough.  Last hemoglobin A1c was: Lab Results  Component Value Date   HGBA1C 8.4 (H) 07/06/2015   HGBA1C 9.2 (H) 05/07/2015   HGBA1C 8.9 03/09/2015   Pt is on an insulin pump: One Touch Ping (Animas) with NovoLog. He got a replacement pump in 10/2014.  Pump settings: - basal rate: He decreased these significantly since last visit! MN: 1.2 >> 0.8  2 am: 1.2 >> 0.8  7 am: 1.2 11 am: 1.1 5 PM: 1.1 11pm 1.0 - ICR:  12 am: 15 6 am: 8 10 am 8 4 pm: 10 - ISF:   MN: 30   11 am: 25  9 pm: 40 - target:  MN: 120 5:30 am: 110  10 pm: 120 TDD bolus ave 42% (23.1 units) TDD basal ave 58% (31.4 units)  Pt checks his sugars 4.1x a day and they are still high - ave 266+/-113 >> 271 +/-110: We downloaded his pump and his meter - will scan reports: - am: 112-266, 300, 343 >> 66-204 >> 56, 62-195, 258, 360 >> 76, 98-253, 397 >> 133-295 >> 191-287, 347 - 2h after b'fast:190, 441 >> n/c >> 110-315 >> 130-140 >> 202-367 >> 134, 266 >> 170-309 >> 222-455 - before lunch: 141-208 >> 52-183, 355 when sick >> 61, 96-294, 330 >> 147-296, 481 >> 116-309 >> 81-234 - 2h after lunch: 99-212, 409 >> 128, 337 when sick >> 324, 325 >> 232-423 >> 100-345 >> 76-252, 348 - before dinner: 77-330 >> 111-262 >> 57x1, 125-403, 454 >> 152, 222-550 (misses lunch insulin!) - bedtime: 125-391 >> 161-388 >> 139-333  >> 87, 116-282 >> 108-289 >> 123-356 >> 196-471 >> 166-357 - nighttime: 275, 172 >> 110, 193, 393 >> 110-190, 295 >> 261, 263 >> 115, 122 >> 68 x 1 (after overbolused for a high in the evening), 181-365 + few lows. Lowest sugar was 57(after working outside) >> 68 ; he has hypoglycemia awareness at 100. Highest: 439 >> 481 >> 400s (site pb) >> 500s.   Glucometer: [employer].  Pt's meals are: - Breakfast: 1.5 scramble eggs, toast, milk - 65 g carbs - Lunch: sandwich, shaved chicken or [place] breast, cheese, mayo, chips, soda - 100 carbs - Dinner: meat, vegetables, bread, fruit, milk - 100 g carbs - Snacks: 2-3 a day: bagel midmorning, evening snack: chips, popcorn  Pt does have mild chronic kidney disease, last BUN/creatinine was:  Lab Results  Component Value Date   BUN 17 08/10/2015   CREATININE 1.00 08/10/2015  Last microalbumin/creatinine ratio was 3.8.  Last set of lipids: Lab Results  Component Value Date   CHOL 268 (H) 05/07/2015   HDL 67 05/07/2015   LDLCALC 186 (H) 05/07/2015   LDLDIRECT 159.5 04/21/2011   TRIG 74 05/07/2015   CHOLHDL 4.0 05/07/2015  He is not on a statin, this is listed as an intolerance. He  Pt's last  eye exam was in 05/2015 [facility]). He has proliferative DR OS, w/o macular edema, s/p detached retina.  Has numbness and tingling in his legs.  He also has a history of hyperlipidemia, disequilibrium, mild cognitive impairment.  Also has hypothyroidism-status post thyroidectomy for multinodular goiter. Last TSH: Lab Results  Component Value Date   TSH 4.22 10/09/2014   He also has OSA-on CPAP, GERD.  I reviewed pt's medications, allergies, PMH, social hx, family hx, and changes were documented in the history of present illness. Otherwise, unchanged from my initial visit note.  ROS: Constitutional: no weight gain, no fatigue, no subjective hyperthermia/hypothermia Eyes: no blurry vision, no xerophthalmia ENT: no sore throat, no nodules  palpated in throat, no dysphagia/odynophagia, no hoarseness Cardiovascular: no CP/SOB/palpitations/ leg swelling Respiratory: no cough/SOB Gastrointestinal: no N/V/D/C Musculoskeletal: no muscle aches/joint aches Skin: no rashes Neurological: no tremors/numbness/tingling/dizziness  PE: BP 128/82 (BP Location: Left Arm, Patient Position: Sitting)   Pulse 86   Ht 6\' 1"  (1.854 m)   Wt 217 lb (98.4 kg)   SpO2 94%   BMI 28.63 kg/m  Body mass index is 28.63 kg/m. Wt Readings from Last 3 Encounters:  09/11/15 217 lb (98.4 kg)  08/27/15 216 lb 12 oz (98.3 kg)  08/10/15 215 lb (97.5 kg)   Constitutional: slightly overweight, in NAD Eyes: PERRLA, EOMI, no exophthalmos ENT: moist mucous membranes, no thyromegaly, no cervical lymphadenopathy Cardiovascular: RRR, no MRG Respiratory: CTA B Gastrointestinal: abdomen soft, NT, ND, BS+ Musculoskeletal: no deformities, strength intact in all 4 Skin: moist, warm, no rashes  ASSESSMENT: 1. DM1, uncontrolled, with complications - CAD, last 2-D echo 05/31/2012: EF 60-65%, mild LVF, moderate aortic stenosis, no LVWMA - cerebrovascular disease - s/p TIA 01/2014 - no neurologic deficit - proliferative diabetic retinopathy-history of retinal detachment OD - right carotid stenosis - had carotid duplex study 05/31/2012 - PVD - peripheral neuropathy - ED  He felt that Humalog was not as efficient with NovoLog and he had to switch back to NovoLog using a preauthorization.  PLAN:  1.  Patient with long-standing type 1 diabetes, poorly controlled, with multiple complications, on an insulin pump. At the review of his pump settings today, His sugars are much higher. He backed off the basal rates for fear of hypoglycemia. Therefore, his sugars are very high every morning, they decrease midday, however, since he frequently forgets to bolus for lunch, they are very high before dinner. - We discussed about needing to start eating better, checking sugars and  and bolusing with every meal - we will increase the basal rates at night, I will increase his insulin sensitivity around dinnertime and decrease it at bedtime - Checked HbA1c today >> 8% (lower) -  I advised him to: Patient Instructions  Please use the following pump settings: - basal rate:  MN: 0.8 >> 1.0 2 am: 0.8 >> 1.0 7 am: 1.2 11 am: 1.1 4 PM: 1.1 >> 1.2 11pm 1.0 >> 1.1 - ICR:  12 am: 15 6 am: 8 10 am 8 4 pm: 10 - ISF:   MN: 30   11 am: 25   6 pm: 25 >> 30  9 pm: 40 >> 30 - target:  MN: 120 5:30 am: 110  10 pm: 120  Please bolus for lunch every day.  Please come back for a follow-up appointment in 1.5 months.  - time spent with the patient: 40 min, of which >50% was spent in reviewing his pump downloads, discussing his hypo- and  hyper-glycemic episodes, reviewing his previous labs and pump settings and developing a plan to avoid hypo- an hyper-glycemia.   Carlus Pavlovristina Cohan Stipes, MD PhD [facility]

## 2015-09-11 NOTE — Patient Instructions (Addendum)
Please use the following pump settings: - basal rate:  MN: 0.8 >> 1.0 2 am: 0.8 >> 1.0 7 am: 1.2 11 am: 1.1 4 PM: 1.1 >> 1.2 11pm 1.0 >> 1.1 - ICR:  12 am: 15 6 am: 8 10 am 8 4 pm: 10 - ISF:   MN: 30   11 am: 25   6 pm: 25 >> 30  9 pm: 40 >> 30 - target:  MN: 120 5:30 am: 110  10 pm: 120  Please bolus for lunch every day.  Please come back for a follow-up appointment in 1.5 months.

## 2015-09-24 ENCOUNTER — Ambulatory Visit (HOSPITAL_COMMUNITY): Payer: Self-pay

## 2015-09-28 ENCOUNTER — Ambulatory Visit (HOSPITAL_COMMUNITY): Payer: Self-pay

## 2015-09-29 ENCOUNTER — Ambulatory Visit: Payer: Self-pay | Admitting: Cardiovascular Disease

## 2015-09-29 ENCOUNTER — Encounter: Payer: Self-pay | Admitting: Cardiovascular Disease

## 2015-09-29 ENCOUNTER — Ambulatory Visit (INDEPENDENT_AMBULATORY_CARE_PROVIDER_SITE_OTHER): Payer: PRIVATE HEALTH INSURANCE | Admitting: Cardiovascular Disease

## 2015-09-29 DIAGNOSIS — E785 Hyperlipidemia, unspecified: Secondary | ICD-10-CM | POA: Diagnosis not present

## 2015-09-29 DIAGNOSIS — I6521 Occlusion and stenosis of right carotid artery: Secondary | ICD-10-CM

## 2015-09-29 DIAGNOSIS — I350 Nonrheumatic aortic (valve) stenosis: Secondary | ICD-10-CM | POA: Diagnosis not present

## 2015-09-29 DIAGNOSIS — I2583 Coronary atherosclerosis due to lipid rich plaque: Secondary | ICD-10-CM

## 2015-09-29 DIAGNOSIS — I2510 Atherosclerotic heart disease of native coronary artery without angina pectoris: Secondary | ICD-10-CM

## 2015-09-29 DIAGNOSIS — I10 Essential (primary) hypertension: Secondary | ICD-10-CM

## 2015-09-29 MED ORDER — TADALAFIL 20 MG PO TABS: 20.0000 mg | ORAL_TABLET | Freq: Every day | ORAL | 6 refills | Status: DC | PRN

## 2015-09-29 NOTE — Assessment & Plan Note (Signed)
History of CAD status post cardiac catheterization performed by myself 06/22/15 revealing three-vessel disease. He subsequently underwent coronary artery bypass grafting X 3 by Dr. Robby SermonHendricks in 07/09/15 with a LIMA to his LAD, a vein to obtuse marginal branch 2 and to the PDA. His postop course was fairly unremarkable. He is about to start cardiac rehabilitation.

## 2015-09-29 NOTE — Assessment & Plan Note (Signed)
History of hyperlipidemia intolerant to statin drugs. Most recent lipid profile performed 05/07/15 her total cholesterol 268, LDL 186 and HDL of 67. We are trying to get him preauthorized for PCS K9

## 2015-09-29 NOTE — Assessment & Plan Note (Signed)
History of moderate aortic stenosis status post right and left heart cath performed by myself 06/22/15 revealing a valve area of approximately 1 cm2. He subsequently underwent aortic valve replacement by Dr. Dorris FetchHendrickson on 07/09/15 with a 23 mm [facility] Magna Ease bovine pericardial valve. A 2-D echocardiogram performed 08/19/15 revealed normal LV function with a well functioning aortic bioprosthesis.

## 2015-09-29 NOTE — Assessment & Plan Note (Signed)
History of hypertension blood pressure measured at 134/78. He is not on antihypertensive medications.

## 2015-09-29 NOTE — Progress Notes (Signed)
09/29/2015 [name]   07/04/1950  [id number]  Primary Physician Jeoffrey MassedMCGOWEN,PHILIP H, MD Primary Cardiologist: Runell GessJonathan J Alannie Amodio MD Roseanne RenoFACP, FACC, FAHA, FSCAI  HPI:  [name] is a 65 year old mildly overweight married Caucasian male, father of 3 and grandfather to 3 grandchildren, I last saw at the time of his carotid stent 07/01/15. He has a history of insulin-dependent diabetes on insulin pump, mild sleep apnea, and hyperlipidemia as well as mild aortic stenosis. He had a Myoview stress test that was abnormal which led to a heart catheterization performed by Dr. Tresa EndoKelly in my absence revealing 70% disease in all 3 vessels with preserved LV function and mild AS. He also had moderate right ICA stenosis. He was neurologically asymptomatic. He was placed on beta-blocker and Ranexa Carotid Dopplers showed stable moderate right ICA stenosis. [facility] 2 sequential TIAs. Carotid Dopplers did show moderate right ICA stenosis. The patient was seen by Dr. Pearlean BrownieSethi, stroke neurologist, asked Dr. Imogene Burnhen, vascular surgeon for consultation. Dr. Imogene Burnhen ordered a CTA which showed high-grade ostial right internal carotid artery stenosis. I am asked to see the patient for consideration of carotid artery stenting. The patient was offered carotid endarterectomy. [name] apparently had a bad experience remotely being intubated at the time of the parathyroid operation and does not want to be reintubated again if at all possible making carotid stenting the best option for revascularization. I perform this on 01/16/14 successfully without complication. His follow-up Doppler 2 weeks later showed his carotid artery widely patent. Since I saw him 6 months ago he's been admitted with TIA type symptoms with negative workup. He also complains of chronic dizziness and increasing dyspnea on exertion. This recent 2-D echo showed increase in hisaortic stenosis now to moderately severewith a peak gradient  that has increased from 35 up to 52 mmHg. [name] underwent right and left heart cath by myself 06/22/15 revealing moderate aortic stenosis with three-vessel coronary artery disease. Essentially underwent elective aortic valve replacement with a [employer] magna ease 23 mm pericardial tissue valve and bypass grafting X 3 on 07/09/15 with a LIMA to his LAD, vein to obtuse marginal branch #2 and to the PDA. He did well. Postoperatively. Follow-up 2-D echocardiogram performed 08/19/15 revealed normal LV function with a well functioning aortic bioprosthesis. He feels clinically improved. He no longer has dyspnea on exertion. His wound is healing nicely. He is scheduled to start cardiac rehabilitation next week.  Current Outpatient Prescriptions  Medication Sig Dispense Refill  . aspirin EC 81 MG tablet Take 81 mg by mouth daily.    . clopidogrel (PLAVIX) 75 MG tablet Take 1 tablet (75 mg total) by mouth daily. 90 tablet 3  . clotrimazole-betamethasone (LOTRISONE) lotion Apply 1 application topically daily as needed (rash).    . FEVERFEW PO Take 1 capsule by mouth daily. To prevent migraines    . Insulin Human (INSULIN PUMP) SOLN Inject into the skin continuous. Basal rate .95/hr, current pump settings: 12am .7, 2am 1, 7am 1.1, 11am .9, 5pm .9.25, 11pm .85. Uses Novolog Insulin.    [name]. levothyroxine (SYNTHROID, LEVOTHROID) 137 MCG tablet TAKE ONE TABLET BY MOUTH ONCE DAILY 90 tablet 1  . Multiple Vitamins-Minerals (ONE-A-DAY MENS 50+ ADVANTAGE) TABS Take 1 tablet by mouth daily with breakfast.    . ranitidine (ZANTAC) 150 MG tablet Take 150 mg by mouth at bedtime as needed for heartburn.     . tadalafil (CIALIS) 20 MG tablet Take 1  tablet (20 mg total) by mouth daily as needed for erectile dysfunction. 15 tablet 6   No current facility-administered medications for this visit.     Allergies  Allergen Reactions  . Rosuvastatin Calcium Other (See Comments)    Problem with higher dosages (Lipitor caused memory  issues), also caused leg pains and lethargy  . Statins Other (See Comments)    Problem with higher dosages (Lipitor caused memory issues), also caused leg pains and lethargy  . Tape Rash and Other (See Comments)    Adhesive tape.  (Paper tape OK)    Social History   Social History  . Marital status: Married    Spouse name: N/A  . Number of children: N/A  . Years of education: N/A   Occupational History  . Not on file.   Social History Main Topics  . Smoking status: Former Games developer  . Smokeless tobacco: Former Neurosurgeon    Quit date: 06/22/1987  . Alcohol use 0.0 oz/week     Comment: occasional  . Drug use: No  . Sexual activity: Not on file   Other Topics Concern  . Not on file   Social History Narrative   Divorced x1, remarried.  Has 3 daughters from first marriage.   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   Distant tobacco abuse (35 pack-yr hx), quit in the late 1990s, no ETOH or drugs.   No exercise.     Review of Systems: General: negative for chills, fever, night sweats or weight changes.  Cardiovascular: negative for chest pain, dyspnea on exertion, edema, orthopnea, palpitations, paroxysmal nocturnal dyspnea or shortness of breath Dermatological: negative for rash Respiratory: negative for cough or wheezing Urologic: negative for hematuria Abdominal: negative for nausea, vomiting, diarrhea, bright red blood per rectum, melena, or hematemesis Neurologic: negative for visual changes, syncope, or dizziness All other systems reviewed and are otherwise negative except as noted above.    Blood pressure 134/78, pulse 88, height 6\' 1"  (1.854 m), weight 222 lb (100.7 kg).  General appearance: alert and no distress Neck: no adenopathy, no carotid bruit, no JVD, supple, symmetrical, trachea midline and thyroid not enlarged, symmetric, no  tenderness/mass/nodules Lungs: clear to auscultation bilaterally Heart: regular rate and rhythm, S1, S2 normal, no murmur, click, rub or gallop Extremities: extremities normal, atraumatic, no cyanosis or edema  EKG not performed today  ASSESSMENT AND PLAN:   Aortic stenosis, moderate History of moderate aortic stenosis status post right and left heart cath performed by myself 06/22/15 revealing a valve area of approximately 1 cm2. He subsequently underwent aortic valve replacement by Dr. Dorris Fetch on 07/09/15 with a 23 mm [facility] Ease bovine pericardial valve. A 2-D echocardiogram performed 08/19/15 revealed normal LV function with a well functioning aortic bioprosthesis.  Essential hypertension History of hypertension blood pressure measured at 134/78. He is not on antihypertensive medications.  Hyperlipidemia History of hyperlipidemia intolerant to statin drugs. Most recent lipid profile performed 05/07/15 her total cholesterol 268, LDL 186 and HDL of 67. We are trying to get him preauthorized for PCS K9  Carotid stenosis, right History of carotid artery disease status post right internal carotid artery stenting I performed 01/16/14. He remains on dual antiplatelet therapy with Plavix.  CAD (coronary artery disease) History of CAD status post cardiac catheterization performed by myself 06/22/15 revealing three-vessel disease. He subsequently underwent coronary artery bypass grafting X 3 by  Dr. Robby Sermon in 07/09/15 with a LIMA to his LAD, a vein to obtuse marginal branch 2 and to the PDA. His postop course was fairly unremarkable. He is about to start cardiac rehabilitation.      Runell Gess MD FACP,FACC,FAHA, [facility] 09/29/2015 1:50 PM

## 2015-09-29 NOTE — Assessment & Plan Note (Signed)
History of carotid artery disease status post right internal carotid artery stenting I performed 01/16/14. He remains on dual antiplatelet therapy with Plavix.

## 2015-09-29 NOTE — Patient Instructions (Signed)
Medication Instructions:  Your physician recommends that you continue on your current medications as directed. Please refer to the Current Medication list given to you today.   Follow-Up: You have been referred to Our Pharmacist for our LIPID CLINIC for evaluation.   Your physician wants you to follow-up in: 6 MONTHS WITH DR Allyson SabalBERRY. You will receive a reminder letter in the mail two months in advance. If you don't receive a letter, please call our office to schedule the follow-up appointment.   If you need a refill on your cardiac medications before your next appointment, please call your pharmacy.

## 2015-09-30 ENCOUNTER — Ambulatory Visit (HOSPITAL_COMMUNITY): Payer: Self-pay

## 2015-09-30 ENCOUNTER — Ambulatory Visit: Payer: Self-pay | Admitting: Cardiovascular Disease

## 2015-10-01 ENCOUNTER — Encounter (HOSPITAL_COMMUNITY): Payer: Self-pay

## 2015-10-01 ENCOUNTER — Ambulatory Visit: Payer: Self-pay

## 2015-10-01 ENCOUNTER — Telehealth: Payer: Self-pay | Admitting: Pharmacist

## 2015-10-01 ENCOUNTER — Encounter (HOSPITAL_COMMUNITY)
Admission: RE | Admit: 2015-10-01 | Discharge: 2015-10-01 | Disposition: A | Discharge status: Home / Self Care | Payer: PRIVATE HEALTH INSURANCE | Source: Ambulatory Visit | Attending: Cardiovascular Disease | Admitting: Cardiovascular Disease

## 2015-10-01 VITALS — BP 118/62 | HR 90 | Ht 73.0 in | Wt 219.6 lb

## 2015-10-01 DIAGNOSIS — I6521 Occlusion and stenosis of right carotid artery: Secondary | ICD-10-CM | POA: Insufficient documentation

## 2015-10-01 DIAGNOSIS — I350 Nonrheumatic aortic (valve) stenosis: Secondary | ICD-10-CM | POA: Diagnosis not present

## 2015-10-01 DIAGNOSIS — Z9889 Other specified postprocedural states: Secondary | ICD-10-CM

## 2015-10-01 DIAGNOSIS — Z951 Presence of aortocoronary bypass graft: Secondary | ICD-10-CM

## 2015-10-01 NOTE — Progress Notes (Signed)
Cardiac Individual Treatment Plan  Patient Details  Name: London PepperRichard L Conry MRN: [id number] Date of Birth: 02/25/1950 Referring Provider:   Flowsheet Row CARDIAC REHAB PHASE II ORIENTATION [facility] 10[facility]01/2015 [facility]  Referring Provider  Andree CossBerry, Jonathon, MD      Initial Encounter Date:  Flowsheet Row CARDIAC REHAB PHASE II [facility]ATION [facility]  Date  10/01/15  Referring Provider  Andree CossBerry, Jonathon, MD      Visit Diagnosis: 07/09/15 S/P CABG x 3  07/09/15 S/P aortic valve repair  Patient's Home Medications on Admission:  Current Outpatient Prescriptions:  .  aspirin EC 81 MG tablet, Take 81 mg by mouth daily., Disp: , Rfl:  .  clopidogrel (PLAVIX) 75 MG tablet, Take 1 tablet (75 mg total) by mouth daily., Disp: 90 tablet, Rfl: 3 .  clotrimazole-betamethasone (LOTRISONE) lotion, Apply 1 application topically daily as needed (rash)., Disp: , Rfl:  .  FEVERFEW PO, Take 1 capsule by mouth daily. To prevent migraines, Disp: , Rfl:  .  Insulin Human (INSULIN PUMP) SOLN, Inject into the skin continuous. Basal rate .95/hr, current pump settings: 12am .7, 2am 1, 7am 1.1, 11am .9, 5pm .9.25, 11pm .85. Uses Novolog Insulin., Disp: , Rfl:  .  Multiple Vitamins-Minerals (ONE-A-DAY MENS 50+ ADVANTAGE) TABS, Take 1 tablet by mouth daily with breakfast., Disp: , Rfl:  .  ranitidine (ZANTAC) 150 MG tablet, Take 150 mg by mouth at bedtime as needed for heartburn. , Disp: , Rfl:  .  levothyroxine (SYNTHROID, LEVOTHROID) 137 MCG tablet, TAKE ONE TABLET BY MOUTH ONCE DAILY, Disp: 90 tablet, Rfl: 1 .  tadalafil (CIALIS) 20 MG tablet, Take 1 tablet (20 mg total) by mouth daily as needed for erectile dysfunction., Disp: 15 tablet, Rfl: 6  Past Medical History: Past Medical History:  Diagnosis Date  . Abnormal stress test 10/27/2011   lat isch--cardiac cath 10/28/2011  . Aortic stenosis, moderate Sept/Oct /2013   a. Mild/mod  by echo; mild by cath 10/28/11; b. Mild progression by echo 05/2012; c. Further mild progression on 01/2014 echo (valve area 0.93 cm2).  d. Further progression on echo 01/2015; e. 07/2015 s[facility]p [facility] bovine pericardial valve model 3300 TFX, ser # [id number].  . 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 Inferolateral perfusion defect + EKG abnormality during stress testing prompted Cath by [facility]: 3V CAD, EF normal.  Med mgmt recommended; b. 06/2015 Cath: 3VD and mod AS; c. 07/2015 CABG x 3 (LIMA->LAD, VG->OM2, VG->PDA) w/ AVR.  [name]. Carotid stenosis, right 09/2011   Dr. Allyson SabalBerry did R carotid stenting 01/2014.  Carotid dopplers 06/2015 essentially normal.  . Complication of anesthesia    difficulty with intubation,   . Decreased pedal pulses    LE doppler 11/08/11- no evidence of arterial insufficiency  . Diabetes mellitus Dx'd age 65   Novolog via insulin pump; sub-optimal control x years  . Diabetic neuropathy (HCC)    fine touch and position sense affected  . Diabetic retinopathy    Proliferative: Hx of retinal detachment on right-light perception only in right eye  . Diastolic dysfunction 2007; 2016   TEE with diastolic dysfunction, EF 74%.  . Difficult intubation    ~ 2002 difficult fiberoptic intubation with takeback bleeding s/p thyroidectomy;  for thyroidectomy was intubated DL X 1 with cricoid pressure but difficult mask (full beard)  . Dizziness    feels off balance  . Erectile dysfunction    [place]  . GERD (gastroesophageal reflux disease)   . High cholesterol    a. intolerant of statins/zeta.  . History of tobacco abuse    Quit about 1990 (has 35 pack-yr hx)  . Hypothyroidism    Thyroidectomy 2002; multinodular goiter  . Impaired vision    right eye light perception only; hx of retinal detachment.  . Impingement syndrome of right shoulder    08/26/15 Pt got subacromial steroid injection by orthopedist (Dr. Delfin Edis).  . Neuromuscular disorder (HCC)    neuropathy  .  OSA (obstructive sleep apnea)    06/2011 sleep study: moderate OSA, CPAP at 12 cm H2O.  . Osteoarthritis    Bilat thumb carpometacarpal joints.  . Recurrent pneumonia   . Shortness of breath dyspnea   . Stroke [facility])    TIA  . TIA (transient ischemic attack) 2015/16   with R carotid dz: pt got R carotid stent 01/2014 by Dr. [name] and was put on dual antiplatelet therapy with ASA 325mg  and plavix 75mg  qd afterwards  . TIA (transient ischemic attack) 05/2015   Left brain (right sided numbness + slurred speech)  Admitted for obs/workup 05/2015.  [name] Venous insufficiency   . Venous reflux 10/14/2011   venous doppler-R GSV continuous reflux throughout; too small for VNUS closure    Tobacco Use: History  Smoking Status  . Former Smoker  Smokeless Tobacco  . Former Neurosurgeon  . Quit date: 06/22/1987    Labs: Recent Review Flowsheet Data    Labs for ITP Cardiac and Pulmonary Rehab Latest Ref Rng & Units 07/09/2015 07/09/2015 07/10/2015 07/20/2015 09/11/2015   Cholestrol 0 - 200 mg/dL - - - - -   LDLCALC 0 - 99 mg/dL - - - - -   LDLDIRECT mg/dL - - - - -   HDL >16 mg/dL - - - - -   Trlycerides <150 mg/dL - - - - -   Hemoglobin A1c - - - - - 8.0   PHART 7.350 - 7.450 7.352 - - - -   PCO2ART 35.0 - 45.0 mmHg 44.3 - - - -   HCO3 20.0 - 24.0 mEq/L 24.6(H) - - - -   TCO2 0 - 100 mmol/L 26 24 26 29  -   ACIDBASEDEF 0.0 - 2.0 mmol/L 1.0 - - - -   O2SAT % 96.0 - - - -      Capillary Blood Glucose: Lab Results  Component Value Date   GLUCAP 187 (H) 07/20/2015   GLUCAP 358 (H) 07/14/2015   GLUCAP 255 (H) 07/14/2015   GLUCAP 344 (H) 07/13/2015   GLUCAP 288 (H) 07/13/2015     Exercise Target Goals: Date: 10/01/15  Exercise Program Goal: Individual exercise prescription set with THRR, safety & activity barriers. Participant demonstrates ability to understand and report RPE using BORG scale, to self-measure pulse accurately, and to acknowledge the importance of the exercise prescription.  Exercise  Prescription Goal: Starting with aerobic activity 30 plus minutes a day, 3 days per week for initial exercise prescription. Provide home exercise prescription and guidelines that participant acknowledges understanding prior to discharge.  Activity Barriers & Risk Stratification:     Activity Barriers & Cardiac Risk Stratification - 10/01/15 1554      Activity Barriers & Cardiac Risk Stratification   Activity Barriers History of Falls;Balance Concerns   Comments history of trips and falls, diabetic neuropathy in his lower legs and feel, history of TIA      6 Minute Walk:  6 Minute Walk    Row Name 10/01/15 1415         6 Minute Walk   Phase Initial     Distance 1417 feet     Walk Time 6 minutes     # of Rest Breaks 0     MPH 2.7     METS 3.3     RPE 12     VO2 Peak 11.42     Symptoms No     Resting HR 90 bpm     Resting BP 118/62     Max Ex. HR 119 bpm     Max Ex. BP 138/62     2 Minute Post BP 126/62        Initial Exercise Prescription:     Initial Exercise Prescription - 10/01/15 1400      Date of Initial Exercise RX and Referring Provider   Date 10/01/15   Referring Provider Andree Coss, MD     Bike   Level 0.8   Minutes 10   METs 2.51     NuStep   Level 3   Minutes 10   METs 2     Track   Laps 11   Minutes 10   METs 2.92     Prescription Details   Frequency (times per week) 3   Duration Progress to 30 minutes of continuous aerobic without signs/symptoms of physical distress     Intensity   THRR 40-80% of Max Heartrate 61-122   Ratings of Perceived Exertion 11-13   Perceived Dyspnea 0-4     Progression   Progression Continue to progress workloads to maintain intensity without signs/symptoms of physical distress.     Resistance Training   Training Prescription Yes   Weight 2lbs   Reps 10-12      Perform Capillary Blood Glucose checks as needed.  Exercise Prescription Changes:   Exercise Comments:   Discharge Exercise  Prescription (Final Exercise Prescription Changes):   Nutrition:  Target Goals: Understanding of nutrition guidelines, daily intake of sodium 1500mg , cholesterol 200mg , calories 30% from fat and 7% or less from saturated fats, daily to have 5 or more servings of fruits and vegetables.  Biometrics:     Pre Biometrics - 10/01/15 1419      Pre Biometrics   Waist Circumference 43.5 inches   Hip Circumference 44 inches   Waist to Hip Ratio 0.99 %   Triceps Skinfold 23 mm   % Body Fat 30.9 %   Grip Strength 37.5 kg   Flexibility 7.5 in   Single Leg Stand 1.23 seconds       Nutrition Therapy Plan and Nutrition Goals:   Nutrition Discharge: Nutrition Scores:   Nutrition Goals Re-Evaluation:   Psychosocial: Target Goals: Acknowledge presence or absence of depression, maximize coping skills, provide positive support system. Participant is able to verbalize types and ability to use techniques and skills needed for reducing stress and depression.  Initial Review & Psychosocial Screening:     Initial Psych Review & Screening - 10/01/15 1555      Initial Review   Comments Pt is returning to work on 10/2.  Pt has some anxiety regarding returning back to work. Ideally pt would like to retire but financially will need to continue to work additional 7 years.      Quality of Life Scores:     Quality of Life - 10/01/15 1420      Quality of Life Scores   Health/Function Pre  25.27 %   Socioeconomic Pre 26.93 %   Psych/Spiritual Pre 24.5 %   Family Pre 26.4 %   GLOBAL Pre 25.62 %      PHQ-9: Recent Review Flowsheet Data    Depression screen [facility] 2/9 10/09/2014   Decreased Interest 0   Down, Depressed, Hopeless 0   PHQ - 2 Score 0      Psychosocial Evaluation and Intervention:   Psychosocial Re-Evaluation:   Vocational Rehabilitation: Provide vocational rehab assistance to qualifying candidates.   Vocational Rehab Evaluation & Intervention:     Vocational  Rehab - 10/01/15 1557      Initial Vocational Rehab Evaluation & Intervention   Assessment shows need for Vocational Rehabilitation No     Discharge Vocational Rehab   Discharge Vocational Rehabilitation Pt plans to return back to work on 10/2. Pt anticipates no issues/      Education: Education Goals: Education classes will be provided on a weekly basis, covering required topics. Participant will state understanding/return demonstration of topics presented.  Learning Barriers/Preferences:     Learning Barriers/Preferences - 10/01/15 4098      Learning Barriers/Preferences   Learning Barriers Sight   Learning Preferences Written Material      Education Topics: Count Your Pulse:  -Group instruction provided by verbal instruction, demonstration, patient participation and written materials to support subject.  Instructors address importance of being able to find your pulse and how to count your pulse when at home without a heart monitor.  Patients get hands on experience counting their pulse with staff help and individually.   Heart Attack, Angina, and Risk Factor Modification:  -Group instruction provided by verbal instruction, video, and written materials to support subject.  Instructors address signs and symptoms of angina and heart attacks.    Also discuss risk factors for heart disease and how to make changes to improve heart health risk factors.   Functional Fitness:  -Group instruction provided by verbal instruction, demonstration, patient participation, and written materials to support subject.  Instructors address safety measures for doing things around the house.  Discuss how to get up and down off the floor, how to pick things up properly, how to safely get out of a chair without assistance, and balance training.   Meditation and Mindfulness:  -Group instruction provided by verbal instruction, patient participation, and written materials to support subject.  Instructor  addresses importance of mindfulness and meditation practice to help reduce stress and improve awareness.  Instructor also leads participants through a meditation exercise.    Stretching for Flexibility and Mobility:  -Group instruction provided by verbal instruction, patient participation, and written materials to support subject.  Instructors lead participants through series of stretches that are designed to increase flexibility thus improving mobility.  These stretches are additional exercise for major muscle groups that are typically performed during regular warm up and cool down.   Hands Only CPR Anytime:  -Group instruction provided by verbal instruction, video, patient participation and written materials to support subject.  Instructors co-teach with AHA video for hands only CPR.  Participants get hands on experience with mannequins.   Nutrition I class: Heart Healthy Eating:  -Group instruction provided by PowerPoint slides, verbal discussion, and written materials to support subject matter. The instructor gives an explanation and review of the Therapeutic Lifestyle Changes diet recommendations, which includes a discussion on lipid goals, dietary fat, sodium, fiber, plant stanol/sterol esters, sugar, and the components of a well-balanced, healthy diet.   Nutrition II class: Lifestyle  Skills:  -Group instruction provided by [employer], verbal discussion, and written materials to support subject matter. The instructor gives an explanation and review of label reading, grocery shopping for heart health, heart healthy recipe modifications, and ways to make healthier choices when eating out.   Diabetes Question & Answer:  -Group instruction provided by PowerPoint slides, verbal discussion, and written materials to support subject matter. The instructor gives an explanation and review of diabetes co-morbidities, pre- and post-prandial blood glucose goals, pre-exercise blood glucose goals,  signs, symptoms, and treatment of hypoglycemia and hyperglycemia, and foot care basics.   Diabetes Blitz:  -Group instruction provided by PowerPoint slides, verbal discussion, and written materials to support subject matter. The instructor gives an explanation and review of the physiology behind type 1 and type 2 diabetes, diabetes medications and rational behind using different medications, pre- and post-prandial blood glucose recommendations and Hemoglobin A1c goals, diabetes diet, and exercise including blood glucose guidelines for exercising safely.    Portion Distortion:  -Group instruction provided by PowerPoint slides, verbal discussion, written materials, and food models to support subject matter. The instructor gives an explanation of serving size versus portion size, changes in portions sizes over the last 20 years, and what consists of a serving from each food group.   Stress Management:  -Group instruction provided by verbal instruction, video, and written materials to support subject matter.  Instructors review role of stress in heart disease and how to cope with stress positively.     Exercising on Your Own:  -Group instruction provided by verbal instruction, power point, and written materials to support subject.  Instructors discuss benefits of exercise, components of exercise, frequency and intensity of exercise, and end points for exercise.  Also discuss use of nitroglycerin and activating EMS.  Review options of places to exercise outside of rehab.  Review guidelines for sex with heart disease.   Cardiac Drugs I:  -Group instruction provided by verbal instruction and written materials to support subject.  Instructor reviews cardiac drug classes: antiplatelets, anticoagulants, beta blockers, and statins.  Instructor discusses reasons, side effects, and lifestyle considerations for each drug class.   Cardiac Drugs II:  -Group instruction provided by verbal instruction and  written materials to support subject.  Instructor reviews cardiac drug classes: angiotensin converting enzyme inhibitors (ACE-I), angiotensin II receptor blockers (ARBs), nitrates, and calcium channel blockers.  Instructor discusses reasons, side effects, and lifestyle considerations for each drug class.   Anatomy and Physiology of the Circulatory System:  -Group instruction provided by verbal instruction, video, and written materials to support subject.  Reviews functional anatomy of heart, how it relates to various diagnoses, and what role the heart plays in the overall system.   Knowledge Questionnaire Score:     Knowledge Questionnaire Score - 10/01/15 1415      Knowledge Questionnaire Score   Pre Score 22/24      Core Components/Risk Factors/Patient Goals at Admission:     Personal Goals and Risk Factors at Admission - 10/01/15 0835      Core Components/Risk Factors/Patient Goals on Admission    Weight Management Yes;Weight Loss   Intervention Weight Management: Develop a combined nutrition and exercise program designed to reach desired caloric intake, while maintaining appropriate intake of nutrient and fiber, sodium and fats, and appropriate energy expenditure required for the weight goal.;Weight Management: Provide education and appropriate resources to help participant work on and attain dietary goals.;Weight Management/Obesity: Establish reasonable short term and long term weight goals.  Expected Outcomes Long Term: Adherence to nutrition and physical activity/exercise program aimed toward attainment of established weight goal;Short Term: Continue to assess and modify interventions until short term weight is achieved;Weight Maintenance: Understanding of the daily nutrition guidelines, which includes 25-35% calories from fat, 7% or less cal from saturated fats, less than 200mg  cholesterol, less than 1.5gm of sodium, & 5 or more servings of fruits and vegetables daily;Weight Loss:  Understanding of general recommendations for a balanced deficit meal plan, which promotes 1-2 lb weight loss per week and includes a negative energy balance of [phone] kcal/d;Understanding recommendations for meals to include 15-35% energy as protein, 25-35% energy from fat, 35-60% energy from carbohydrates, less than 200mg  of dietary cholesterol, 20-35 gm of total fiber daily;Understanding of distribution of calorie intake throughout the day with the consumption of 4-5 meals/snacks   Sedentary Yes   Intervention Provide advice, education, support and counseling about physical activity/exercise needs.;Develop an individualized exercise prescription for aerobic and resistive training based on initial evaluation findings, risk stratification, comorbidities and participant's personal goals.   Expected Outcomes Achievement of increased cardiorespiratory fitness and enhanced flexibility, muscular endurance and strength shown through measurements of functional capacity and personal statement of participant.   Increase Strength and Stamina Yes   Intervention Provide advice, education, support and counseling about physical activity/exercise needs.;Develop an individualized exercise prescription for aerobic and resistive training based on initial evaluation findings, risk stratification, comorbidities and participant's personal goals.   Expected Outcomes Achievement of increased cardiorespiratory fitness and enhanced flexibility, muscular endurance and strength shown through measurements of functional capacity and personal statement of participant.   Diabetes Yes   Intervention Provide education about signs/symptoms and action to take for hypo/hyperglycemia.;Provide education about proper nutrition, including hydration, and aerobic/resistive exercise prescription along with prescribed medications to achieve blood glucose in normal ranges: Fasting glucose 65-99 mg/dL   Expected Outcomes Short Term: Participant  verbalizes understanding of the signs/symptoms and immediate care of hyper/hypoglycemia, proper foot care and importance of medication, aerobic/resistive exercise and nutrition plan for blood glucose control.   Hypertension Yes   Intervention Provide education on lifestyle modifcations including regular physical activity/exercise, weight management, moderate sodium restriction and increased consumption of fresh fruit, vegetables, and low fat dairy, alcohol moderation, and smoking cessation.;Monitor prescription use compliance.   Expected Outcomes Short Term: Continued assessment and intervention until BP is < 140/61mm HG in hypertensive participants. < 130/90mm HG in hypertensive participants with diabetes, heart failure or chronic kidney disease.;Long Term: Maintenance of blood pressure at goal levels.   Lipids Yes   Intervention Provide education and support for participant on nutrition & aerobic/resistive exercise along with prescribed medications to achieve LDL 70mg , HDL >40mg .   Expected Outcomes Short Term: Participant states understanding of desired cholesterol values and is compliant with medications prescribed. Participant is following exercise prescription and nutrition guidelines.;Long Term: Cholesterol controlled with medications as prescribed, with individualized exercise RX and with personalized nutrition plan. Value goals: LDL < 70mg , HDL > 40 mg.   Personal Goal Other Yes   Personal Goal Improve SOB with activities and lose weight. Goal weight at 190   Intervention Provide education on nutrition guidelines for diabetes and heart health. Also develop an exercise program to improve exercise tolerance and capacity.   Expected Outcomes Pt will be able to exercise without difficulty or unsual SOB and lose weight      Core Components/Risk Factors/Patient Goals Review:    Core Components/Risk Factors/Patient Goals at Discharge (Final Review):    ITP  Comments:     ITP Comments    Row  Name 10/01/15 0830           ITP Comments Dr. Armanda Magic, Medical Director          Comments:  Pt in today for cardiac rehab orientation appointment from 0800- 1030. Pt is alone, wife is at home. As a part of the orientation appt, pt completed 6 minute walk test.  Pt tolerated well with no complaints.  Pt does have a history of balance issues and sometimes trips and falls on uneven surfaces.  Pt is to follow up with Dr. Pearlean Brownie for Tia's. Monitor showed SR with no noted ectopy.  Pt will return back to work on Monday and verbalizes concerns related to the mental capacity to remember passwords and procedures for his job. Offered support and  encouragement to pt. Alanson Aly, BSN

## 2015-10-01 NOTE — Progress Notes (Signed)
Cardiac Rehab Medication Review by a Pharmacist  Does the patient  feel that his/her medications are working for him/her?  yes  Has the patient been experiencing any side effects to the medications prescribed?  no  Does the patient measure his/her own blood pressure or blood glucose at home?  yes   Does the patient have any problems obtaining medications due to transportation or finances?   no  Understanding of regimen: excellent Understanding of indications: excellent Potential of compliance: good    Pharmacist comments: [name] is a pleasant 65 yo male who presents to cardiac rehab without assistance. He has an excellent understanding of his medications and how to use each one. He agrees to his medication regimen and voices no concerns.  Allie BossierApryl Ciji Boston, PharmD PGY1 Pharmacy Resident [phone]) 10/01/2015 9:10 AM

## 2015-10-01 NOTE — Telephone Encounter (Signed)
Patient called about PCSK9i. Insurance is requiring a trial on Zetia and Welchol before approval of PCSK9i.  Patient has previously tried Zetia and had similar effects as with statin medications. He reports muscle aching and memory loss with Zetia.  He is willing to do trial on Welchol, though he states he is doubtful he will be able to tolerate as he frequently has GI discomfort with minimal irritants.   Will provide samples. He will call if unable to tolerate Welchol. Otherwise will do repeat labs in 2-3 months on therapy.

## 2015-10-02 ENCOUNTER — Ambulatory Visit (HOSPITAL_COMMUNITY): Payer: Self-pay

## 2015-10-05 ENCOUNTER — Encounter (HOSPITAL_COMMUNITY)
Admission: RE | Admit: 2015-10-05 | Discharge: 2015-10-05 | Disposition: A | Discharge status: Home / Self Care | Payer: PRIVATE HEALTH INSURANCE | Source: Ambulatory Visit | Attending: Cardiovascular Disease | Admitting: Cardiovascular Disease

## 2015-10-05 ENCOUNTER — Encounter: Payer: Self-pay | Admitting: Family Medicine

## 2015-10-05 DIAGNOSIS — I2510 Atherosclerotic heart disease of native coronary artery without angina pectoris: Secondary | ICD-10-CM | POA: Diagnosis not present

## 2015-10-05 DIAGNOSIS — Z951 Presence of aortocoronary bypass graft: Secondary | ICD-10-CM | POA: Insufficient documentation

## 2015-10-05 DIAGNOSIS — Z8673 Personal history of transient ischemic attack (TIA), and cerebral infarction without residual deficits: Secondary | ICD-10-CM | POA: Diagnosis not present

## 2015-10-05 DIAGNOSIS — E113599 Type 2 diabetes mellitus with proliferative diabetic retinopathy without macular edema, unspecified eye: Secondary | ICD-10-CM | POA: Diagnosis not present

## 2015-10-05 DIAGNOSIS — K219 Gastro-esophageal reflux disease without esophagitis: Secondary | ICD-10-CM | POA: Diagnosis not present

## 2015-10-05 DIAGNOSIS — G4733 Obstructive sleep apnea (adult) (pediatric): Secondary | ICD-10-CM | POA: Diagnosis not present

## 2015-10-05 DIAGNOSIS — Z7982 Long term (current) use of aspirin: Secondary | ICD-10-CM | POA: Diagnosis not present

## 2015-10-05 DIAGNOSIS — Z9641 Presence of insulin pump (external) (internal): Secondary | ICD-10-CM | POA: Diagnosis not present

## 2015-10-05 DIAGNOSIS — Z7902 Long term (current) use of antithrombotics/antiplatelets: Secondary | ICD-10-CM | POA: Diagnosis not present

## 2015-10-05 DIAGNOSIS — Z9889 Other specified postprocedural states: Secondary | ICD-10-CM

## 2015-10-05 DIAGNOSIS — Z79899 Other long term (current) drug therapy: Secondary | ICD-10-CM | POA: Insufficient documentation

## 2015-10-05 DIAGNOSIS — E7800 Pure hypercholesterolemia, unspecified: Secondary | ICD-10-CM | POA: Diagnosis not present

## 2015-10-05 DIAGNOSIS — Z953 Presence of xenogenic heart valve: Secondary | ICD-10-CM | POA: Insufficient documentation

## 2015-10-05 DIAGNOSIS — E039 Hypothyroidism, unspecified: Secondary | ICD-10-CM | POA: Insufficient documentation

## 2015-10-05 DIAGNOSIS — I872 Venous insufficiency (chronic) (peripheral): Secondary | ICD-10-CM | POA: Diagnosis not present

## 2015-10-05 DIAGNOSIS — Z87891 Personal history of nicotine dependence: Secondary | ICD-10-CM | POA: Insufficient documentation

## 2015-10-05 DIAGNOSIS — E1140 Type 2 diabetes mellitus with diabetic neuropathy, unspecified: Secondary | ICD-10-CM | POA: Diagnosis not present

## 2015-10-05 DIAGNOSIS — Z794 Long term (current) use of insulin: Secondary | ICD-10-CM | POA: Insufficient documentation

## 2015-10-05 LAB — GLUCOSE, CAPILLARY
GLUCOSE-CAPILLARY: 233 mg/dL — AB (ref 65–99)
GLUCOSE-CAPILLARY: 139 mg/dL — AB (ref 65–99)

## 2015-10-05 NOTE — Progress Notes (Signed)
Daily Session Note  Patient Details  Name: [name] MRN: [id number] Date of Birth: Apr 30, 1950 Referring Provider:   Flowsheet Row CARDIAC REHAB PHASE II ORIENTATION from 10/01/2015 in [place]  Referring Provider  Ancil Linsey, MD      Encounter Date: 10/05/2015  Check In:     Session Check In - 10/05/15 0732      Check-In   Location [facility]   Staff Present Maurice Small, RN, BSN;Amber Fair, MS, ACSM RCEP, Exercise Physiologist;Joann Rion, RN, Luisa Hart, RN, BSN   Supervising physician immediately available to respond to emergencies [facility] immediately available   Physician(s) Dr. Maylene Roes    Medication changes reported     No   Fall or balance concerns reported    No   Warm-up and Cool-down Performed as group-led instruction   Resistance Training Performed Yes   VAD Patient? No     Pain Assessment   Currently in Pain? No/denies      Capillary Blood Glucose: Results for orders placed or performed during the hospital encounter of 10/05/15 (from the past 24 hour(s))  Glucose, capillary     Status: Abnormal   Collection Time: 10/05/15  8:04 AM  Result Value Ref Range   Glucose-Capillary 139 (H) 65 - 99 mg/dL     Goals Met:  Exercise tolerated well Personal goals reviewed  Goals Unmet:  Not Applicable  Comments:  Pt started full exercise in  cardiac rehab today.  Pt tolerated light exercise without difficulty. VSS, telemetry- SR without any noted ectopy., asymptomatic. Pt maintained good blood glucose control with acceptable pre and post exercise reading.  Pt purposefully ate a larger breakfast and reduced the amount of insulin delivered through his insulin pump. Pt post exercise reading demonstrated 100 point decrease.  Advised pt to continue to do this on exercise days. Medication list reconciled. Pt denies barriers to medication compliance. PSYCHOSOCIAL ASSESSMENT:  PHQ-0. Pt exhibits  positive coping skills, hopeful outlook with supportive family. No psychosocial needs identified at this time, no psychosocial interventions necessary.    Pt enjoys fishing and golfing. Pt returns back to work today and believe he is ready physically but worries some about the mental aspects. Pt at times does feel a bit "fuzzy" with his thinking about procedures and recalling passwords. Goals reviewed. Pt desires to improve his shortness of breath with activities, lose weight with goal weight 185-190 pounds Pt weights 217. Will monitor pt progress toward meeting these goals.  Pt encouraged to attend group nutrition classes on tuesdays.  Since pt is returning back to work it will be difficult for pt to attend now but pt would like to attend when the exercise portion end so he will not have to miss as much work. Pt oriented to exercise equipment and routine.    Understanding verbalized. Maurice Small RN, BSN     Dr. Fransico Him is Medical Director for Cardiac Rehab at [facility].

## 2015-10-06 ENCOUNTER — Encounter (HOSPITAL_COMMUNITY): Payer: PRIVATE HEALTH INSURANCE

## 2015-10-07 ENCOUNTER — Encounter (HOSPITAL_COMMUNITY)
Admission: RE | Admit: 2015-10-07 | Discharge: 2015-10-07 | Disposition: A | Discharge status: Home / Self Care | Payer: PRIVATE HEALTH INSURANCE | Source: Ambulatory Visit | Attending: Cardiovascular Disease | Admitting: Cardiovascular Disease

## 2015-10-07 DIAGNOSIS — Z951 Presence of aortocoronary bypass graft: Secondary | ICD-10-CM

## 2015-10-07 DIAGNOSIS — Z9889 Other specified postprocedural states: Secondary | ICD-10-CM

## 2015-10-07 LAB — GLUCOSE, CAPILLARY
GLUCOSE-CAPILLARY: 285 mg/dL — AB (ref 65–99)
GLUCOSE-CAPILLARY: 259 mg/dL — AB (ref 65–99)

## 2015-10-08 ENCOUNTER — Encounter (HOSPITAL_COMMUNITY): Payer: PRIVATE HEALTH INSURANCE

## 2015-10-09 ENCOUNTER — Encounter (HOSPITAL_COMMUNITY)
Admission: RE | Admit: 2015-10-09 | Discharge: 2015-10-09 | Disposition: A | Discharge status: Home / Self Care | Payer: PRIVATE HEALTH INSURANCE | Source: Ambulatory Visit | Attending: Cardiovascular Disease | Admitting: Cardiovascular Disease

## 2015-10-09 DIAGNOSIS — Z9889 Other specified postprocedural states: Secondary | ICD-10-CM

## 2015-10-09 DIAGNOSIS — Z951 Presence of aortocoronary bypass graft: Secondary | ICD-10-CM | POA: Diagnosis not present

## 2015-10-09 LAB — GLUCOSE, CAPILLARY
Glucose-Capillary: 261 mg/dL — ABNORMAL HIGH (ref 65–99)
Glucose-Capillary: 227 mg/dL — ABNORMAL HIGH (ref 65–99)

## 2015-10-12 ENCOUNTER — Encounter (HOSPITAL_COMMUNITY)
Admission: RE | Admit: 2015-10-12 | Discharge: 2015-10-12 | Disposition: A | Discharge status: Home / Self Care | Payer: PRIVATE HEALTH INSURANCE | Source: Ambulatory Visit | Attending: Cardiovascular Disease | Admitting: Cardiovascular Disease

## 2015-10-12 DIAGNOSIS — Z951 Presence of aortocoronary bypass graft: Secondary | ICD-10-CM | POA: Diagnosis not present

## 2015-10-12 DIAGNOSIS — Z9889 Other specified postprocedural states: Secondary | ICD-10-CM

## 2015-10-12 LAB — GLUCOSE, CAPILLARY: GLUCOSE-CAPILLARY: 150 mg/dL — AB (ref 65–99)

## 2015-10-13 ENCOUNTER — Encounter (HOSPITAL_COMMUNITY): Payer: PRIVATE HEALTH INSURANCE

## 2015-10-14 ENCOUNTER — Encounter (HOSPITAL_COMMUNITY): Payer: PRIVATE HEALTH INSURANCE

## 2015-10-15 ENCOUNTER — Encounter (HOSPITAL_COMMUNITY): Payer: PRIVATE HEALTH INSURANCE

## 2015-10-15 NOTE — Progress Notes (Signed)
Cardiac Individual Treatment Plan  Patient Details  Name: [name] MRN: [id number] Date of Birth: [birth year] Referring Provider:   Flowsheet Row CARDIAC REHAB PHASE II ORIENTATION from 10/01/2015 in [place]  Referring Provider  Ancil Linsey, MD      Initial Encounter Date:  [facility] PHASE II ORIENTATION from 10/01/2015 in [place]  Date  10/01/15  Referring Provider  Ancil Linsey, MD      Visit Diagnosis: 07/09/15 S/P CABG x 3  07/09/15 S/P aortic valve repair  Patient's Home Medications on Admission:  Current Outpatient Prescriptions:  .  aspirin EC 81 MG tablet, Take 81 mg by mouth daily., Disp: , Rfl:  .  clopidogrel (PLAVIX) 75 MG tablet, Take 1 tablet (75 mg total) by mouth daily., Disp: 90 tablet, Rfl: 3 .  clotrimazole-betamethasone (LOTRISONE) lotion, Apply 1 application topically daily as needed (rash)., Disp: , Rfl:  .  FEVERFEW PO, Take 1 capsule by mouth daily. To prevent migraines, Disp: , Rfl:  .  Insulin Human (INSULIN PUMP) SOLN, Inject into the skin continuous. Basal rate .95/hr, current pump settings: 12am .7, 2am 1, 7am 1.1, 11am .9, 5pm .9.25, 11pm .85. Uses Novolog Insulin., Disp: , Rfl:  .  levothyroxine (SYNTHROID, LEVOTHROID) 137 MCG tablet, TAKE ONE TABLET BY MOUTH ONCE DAILY, Disp: 90 tablet, Rfl: 1 .  Multiple Vitamins-Minerals (ONE-A-DAY MENS 50+ ADVANTAGE) TABS, Take 1 tablet by mouth daily with breakfast., Disp: , Rfl:  .  ranitidine (ZANTAC) 150 MG tablet, Take 150 mg by mouth at bedtime as needed for heartburn. , Disp: , Rfl:  .  tadalafil (CIALIS) 20 MG tablet, Take 1 tablet (20 mg total) by mouth daily as needed for erectile dysfunction., Disp: 15 tablet, Rfl: 6  Past Medical History: Past Medical History:  Diagnosis Date  . Abnormal stress test 10/27/2011   lat isch--cardiac cath 10/28/2011  . Aortic stenosis, moderate Sept/Oct /2013   a. Mild/mod  by echo; mild by cath 10/28/11; b. Mild progression by echo 05/2012; c. Further mild progression on 01/2014 echo (valve area 0.93 cm2).  d. Further progression on echo 01/2015; e. 07/2015 s/p [facility] Ease bovine pericardial valve model 3300 TFX, ser # [id number].  . 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 Inferolateral perfusion defect + EKG abnormality during stress testing prompted Cath by [facility]: 3V CAD, EF normal.  Med mgmt recommended; b. 06/2015 Cath: 3VD and mod AS; c. 07/2015 CABG x 3 (LIMA->LAD, VG->OM2, VG->PDA) w/ AVR.  [name] Carotid stenosis, right 09/2011   Dr. Gwenlyn Found did R carotid stenting 01/2014.  Carotid dopplers 06/2015 essentially normal.  . Complication of anesthesia    difficulty with intubation,   . Decreased pedal pulses    LE doppler 11/08/11- no evidence of arterial insufficiency  . Diabetes mellitus Dx'd age 31   Novolog via insulin pump; sub-optimal control x years  . Diabetic neuropathy (HCC)    fine touch and position sense affected  . Diabetic retinopathy    Proliferative: Hx of retinal detachment on right-light perception only in right eye  . Diastolic dysfunction 4696; 2016   TEE with diastolic dysfunction, EF 29%.  . Difficult intubation    ~ 2002 difficult fiberoptic intubation with takeback bleeding s/p thyroidectomy;  for thyroidectomy was intubated DL X 1 with cricoid pressure but difficult mask (full beard)  . Dizziness    feels off balance  . Erectile dysfunction    [place]rolog[place]  . GERD (gastroesophageal reflux disease)   . High cholesterol    a. intolerant of statins/zeta.  . History of tobacco abuse    Quit about 1990 (has 35 pack-yr hx)  . Hypothyroidism    Thyroidectomy 2002; multinodular goiter  . Impaired vision    right eye light perception only; hx of retinal detachment.  . Impingement syndrome of right shoulder    08/26/15 Pt got subacromial steroid injection by orthopedist (Dr. Tama Headings).  . Neuromuscular disorder ([place])    neuropathy  .  OSA (obstructive sleep apnea)    06/2011 sleep study: moderate OSA, CPAP at 12 cm H2O.  . Osteoarthritis    Bilat thumb carpometacarpal joints.  . Recurrent pneumonia   . Shortness of breath dyspnea   . Stroke [facility])    TIA  . TIA (transient ischemic attack) 2015/16   with R carotid dz: pt got R carotid stent 01/2014 by Dr. Gwenlyn Found and was put on dual antiplatelet therapy with ASA 349m and plavix [id number] afterwards  . TIA (transient ischemic attack) 05/2015   Left brain (right sided numbness + slurred speech)  Admitted for obs/workup 05/2015.  . Marland Kitchenenous insufficiency   . Venous reflux 10/14/2011   venous doppler-R GSV continuous reflux throughout; too small for VNUS closure    Tobacco Use: History  Smoking Status  . Former Smoker  Smokeless Tobacco  . Former [employer] developer. Quit date: 06/22/1987    Labs: Recent Review Flowsheet Data    Labs for ITP Cardiac and Pulmonary Rehab Latest Ref Rng & Units 07/09/2015 07/09/2015 07/10/2015 07/20/2015 09/11/2015   Cholestrol 0 - 200 mg/dL - - - - -   LDLCALC 0 - 99 mg/dL - - - - -   LDLDIRECT mg/dL - - - - -   HDL >40 mg/dL - - - - -   Trlycerides <150 mg/dL - - - - -   Hemoglobin A1c - - - - - 8.0   PHART 7.350 - 7.450 7.352 - - - -   PCO2ART 35.0 - 45.0 mmHg 44.3 - - - -   HCO3 20.0 - 24.0 mEq/L 24.6(H) - - - -   TCO2 0 - 100 mmol/L _0 -   ACIDBASEDEF 0.0 - 2.0 mmol/L 1.0 - - - -   O2SAT % 96.0 - - - -      Capillary Blood Glucose: Lab Results  Component Value Date   GLUCAP 150 (H) 10/12/2015   GLUCAP 227 (H) 10/09/2015   GLUCAP 261 (H) 10/09/2015   GLUCAP 259 (H) 10/07/2015   GLUCAP 285 (H) 10/07/2015     Exercise Target Goals:    Exercise Program Goal: Individual exercise prescription set with THRR, safety & activity barriers. Participant demonstrates ability to understand and report RPE using BORG scale, to self-measure pulse accurately, and to acknowledge the importance of the exercise prescription.  Exercise Prescription  Goal: Starting with aerobic activity 30 plus minutes a day, 3 days per week for initial exercise prescription. Provide home exercise prescription and guidelines that participant acknowledges understanding prior to discharge.  Activity Barriers & Risk Stratification:     Activity Barriers & Cardiac Risk Stratification - 10/01/15 1554      Activity Barriers & Cardiac Risk Stratification   Activity Barriers History of Falls;Balance Concerns   Comments history of trips and falls, diabetic neuropathy in his lower legs and feel, history of TIA      6 Minute Walk:  [place] Name 10/01/15 1415         6 Minute Walk   Phase Initial     Distance 1417 feet     Walk Time 6 minutes     # of Rest Breaks 0     MPH 2.7     METS 3.3     RPE 12     VO2 Peak 11.42     Symptoms No     Resting HR 90 bpm     Resting BP 118/62     Max Ex. HR 119 bpm     Max Ex. BP 138/62     2 Minute Post BP 126/62        Initial Exercise Prescription:     Initial Exercise Prescription - 10/01/15 1400      Date of Initial Exercise RX and Referring Provider   Date 10/01/15   Referring Provider Ancil Linsey, MD     Bike   Level 0.8   Minutes 10   METs 2.51     NuStep   Level 3   Minutes 10   METs 2     Track   Laps 11   Minutes 10   METs 2.92     Prescription Details   Frequency (times per week) 3   Duration Progress to 30 minutes of continuous aerobic without signs/symptoms of physical distress     Intensity   THRR 40-80% of Max Heartrate 61-122   Ratings of Perceived Exertion 11-13   Perceived Dyspnea 0-4     Progression   Progression Continue to progress workloads to maintain intensity without signs/symptoms of physical distress.     Resistance Training   Training Prescription Yes   Weight 2lbs   Reps 10-12      Perform Capillary Blood Glucose checks as needed.  Exercise Prescription Changes:   Exercise Comments:   Discharge Exercise Prescription  (Final Exercise Prescription Changes):   Nutrition:  Target Goals: Understanding of nutrition guidelines, daily intake of sodium <1540m, cholesterol <2030m calories 30% from fat and 7% or less from saturated fats, daily to have 5 or more servings of fruits and vegetables.  Biometrics:     Pre Biometrics - 10/01/15 1419      Pre Biometrics   Waist Circumference 43.5 inches   Hip Circumference 44 inches   Waist to Hip Ratio 0.99 %   Triceps Skinfold 23 mm   % Body Fat 30.9 %   Grip Strength 37.5 kg   Flexibility 7.5 in   Single Leg Stand 1.23 seconds       Nutrition Therapy Plan and Nutrition Goals:     Nutrition Therapy & Goals - 10/05/15 1134      Nutrition Therapy   Diet Carb Modified, Therapeutic Lifestyle Changes     Personal Nutrition Goals   Personal Goal #1 1-2 lb wt loss/week to a wt loss goal of 6-24 lb   Personal Goal #2 Improved glycemic control as evidenced by a decrease in A1c from 8.0 toward less than 7.0     Intervention Plan   Intervention Prescribe, educate and counsel regarding individualized specific dietary modifications aiming towards targeted core components such as weight, hypertension, lipid management, diabetes, heart failure and other comorbidities.   Expected Outcomes Short Term Goal: Understand basic principles of dietary content, such as calories, fat, sodium, cholesterol and nutrients.;Long Term Goal: Adherence to prescribed nutrition plan.      Nutrition Discharge: Nutrition  Scores:     Nutrition Assessments - 10/05/15 1134      MEDFICTS Scores   Pre Score 12      Nutrition Goals Re-Evaluation:   Psychosocial: Target Goals: Acknowledge presence or absence of depression, maximize coping skills, provide positive support system. Participant is able to verbalize types and ability to use techniques and skills needed for reducing stress and depression.  Initial Review & Psychosocial Screening:     Initial Psych Review & Screening  - 10/01/15 1555      Initial Review   Comments Pt is returning to work on 10/2.  Pt has some anxiety regarding returning back to work. Ideally pt would like to retire but financially will need to continue to work additional 7 years.      Quality of Life Scores:     Quality of Life - 10/01/15 1420      Quality of Life Scores   Health/Function Pre 25.27 %   Socioeconomic Pre 26.93 %   Psych/Spiritual Pre 24.5 %   Family Pre 26.4 %   GLOBAL Pre 25.62 %      PHQ-9: Recent Review Flowsheet Data    Depression screen [facility] 2/9 10/05/2015 10/09/2014   Decreased Interest 0 0   Down, Depressed, Hopeless 0 0   PHQ - 2 Score 0 0      Psychosocial Evaluation and Intervention:     Psychosocial Evaluation - 10/15/15 1520      Psychosocial Evaluation & Interventions   Interventions Encouraged to exercise with the program and follow exercise prescription   Comments Pt has supportive family and demonstrates positve and healthy outlook on his recovery from his cardiac event.      Psychosocial Re-Evaluation:   Vocational Rehabilitation: Provide vocational rehab assistance to qualifying candidates.   Vocational Rehab Evaluation & Intervention:     Vocational Rehab - 10/01/15 1557      Initial Vocational Rehab Evaluation & Intervention   Assessment shows need for Vocational Rehabilitation No     Discharge Vocational Rehab   Discharge Vocational Rehabilitation Pt plans to return back to work on 10/2. Pt anticipates no issues/      Education: Education Goals: Education classes will be provided on a weekly basis, covering required topics. Participant will state understanding/return demonstration of topics presented.  Learning Barriers/Preferences:     Learning Barriers/Preferences - 10/01/15 0867      Learning Barriers/Preferences   Learning Barriers Sight   Learning Preferences Written Material      Education Topics: Count Your Pulse:  -Group instruction provided by  verbal instruction, demonstration, patient participation and written materials to support subject.  Instructors address importance of being able to find your pulse and how to count your pulse when at home without a heart monitor.  Patients get hands on experience counting their pulse with staff help and individually.   Heart Attack, Angina, and Risk Factor Modification:  -Group instruction provided by verbal instruction, video, and written materials to support subject.  Instructors address signs and symptoms of angina and heart attacks.    Also discuss risk factors for heart disease and how to make changes to improve heart health risk factors.   Functional Fitness:  -Group instruction provided by verbal instruction, demonstration, patient participation, and written materials to support subject.  Instructors address safety measures for doing things around the house.  Discuss how to get up and down off the floor, how to pick things up properly, how to safely get out of a  chair without assistance, and balance training.   Meditation and Mindfulness:  -Group instruction provided by verbal instruction, patient participation, and written materials to support subject.  Instructor addresses importance of mindfulness and meditation practice to help reduce stress and improve awareness.  Instructor also leads participants through a meditation exercise.    Stretching for Flexibility and Mobility:  -Group instruction provided by verbal instruction, patient participation, and written materials to support subject.  Instructors lead participants through series of stretches that are designed to increase flexibility thus improving mobility.  These stretches are additional exercise for major muscle groups that are typically performed during regular warm up and cool down.   Hands Only CPR Anytime:  -Group instruction provided by verbal instruction, video, patient participation and written materials to support  subject.  Instructors co-teach with AHA video for hands only CPR.  Participants get hands on experience with mannequins.   Nutrition I class: Heart Healthy Eating:  -Group instruction provided by PowerPoint slides, verbal discussion, and written materials to support subject matter. The instructor gives an explanation and review of the Therapeutic Lifestyle Changes diet recommendations, which includes a discussion on lipid goals, dietary fat, sodium, fiber, plant stanol/sterol esters, sugar, and the components of a well-balanced, healthy diet.   Nutrition II class: Lifestyle Skills:  -Group instruction provided by PowerPoint slides, verbal discussion, and written materials to support subject matter. The instructor gives an explanation and review of label reading, grocery shopping for heart health, heart healthy recipe modifications, and ways to make healthier choices when eating out.   Diabetes Question & Answer:  -Group instruction provided by PowerPoint slides, verbal discussion, and written materials to support subject matter. The instructor gives an explanation and review of diabetes co-morbidities, pre- and post-prandial blood glucose goals, pre-exercise blood glucose goals, signs, symptoms, and treatment of hypoglycemia and hyperglycemia, and foot care basics.   Diabetes Blitz:  -Group instruction provided by PowerPoint slides, verbal discussion, and written materials to support subject matter. The instructor gives an explanation and review of the physiology behind type 1 and type 2 diabetes, diabetes medications and rational behind using different medications, pre- and post-prandial blood glucose recommendations and Hemoglobin A1c goals, diabetes diet, and exercise including blood glucose guidelines for exercising safely.    Portion Distortion:  -Group instruction provided by PowerPoint slides, verbal discussion, written materials, and food models to support subject matter. The instructor  gives an explanation of serving size versus portion size, changes in portions sizes over the last 20 years, and what consists of a serving from each food group. Flowsheet Row CARDIAC REHAB PHASE II EXERCISE from 10/09/2015 in [place]  Date  10/07/15  Educator  RD  Instruction Review Code  2- meets goals/outcomes      Stress Management:  -Group instruction provided by verbal instruction, video, and written materials to support subject matter.  Instructors review role of stress in heart disease and how to cope with stress positively.     Exercising on Your Own:  -Group instruction provided by verbal instruction, power point, and written materials to support subject.  Instructors discuss benefits of exercise, components of exercise, frequency and intensity of exercise, and end points for exercise.  Also discuss use of nitroglycerin and activating EMS.  Review options of places to exercise outside of rehab.  Review guidelines for sex with heart disease.   Cardiac Drugs I:  -Group instruction provided by verbal instruction and written materials to support subject.  Instructor reviews cardiac  drug classes: antiplatelets, anticoagulants, beta blockers, and statins.  Instructor discusses reasons, side effects, and lifestyle considerations for each drug class.   Cardiac Drugs II:  -Group instruction provided by verbal instruction and written materials to support subject.  Instructor reviews cardiac drug classes: angiotensin converting enzyme inhibitors (ACE-I), angiotensin II receptor blockers (ARBs), nitrates, and calcium channel blockers.  Instructor discusses reasons, side effects, and lifestyle considerations for each drug class.   Anatomy and Physiology of the Circulatory System:  -Group instruction provided by verbal instruction, video, and written materials to support subject.  Reviews functional anatomy of heart, how it relates to various diagnoses, and what  role the heart plays in the overall system.   Knowledge Questionnaire Score:     Knowledge Questionnaire Score - 10/01/15 1415      Knowledge Questionnaire Score   Pre Score 22/24      Core Components/Risk Factors/Patient Goals at Admission:     Personal Goals and Risk Factors at Admission - 10/01/15 0835      Core Components/Risk Factors/Patient Goals on Admission    Weight Management Yes;Weight Loss   Intervention Weight Management: Develop a combined nutrition and exercise program designed to reach desired caloric intake, while maintaining appropriate intake of nutrient and fiber, sodium and fats, and appropriate energy expenditure required for the weight goal.;Weight Management: Provide education and appropriate resources to help participant work on and attain dietary goals.;Weight Management/Obesity: Establish reasonable short term and long term weight goals.   Expected Outcomes Long Term: Adherence to nutrition and physical activity/exercise program aimed toward attainment of established weight goal;Short Term: Continue to assess and modify interventions until short term weight is achieved;Weight Maintenance: Understanding of the daily nutrition guidelines, which includes 25-35% calories from fat, 7% or less cal from saturated fats, less than 220m cholesterol, less than 1.5gm of sodium, & 5 or more servings of fruits and vegetables daily;Weight Loss: Understanding of general recommendations for a balanced deficit meal plan, which promotes 1-2 lb weight loss per week and includes a negative energy balance of [id number] kcal/d;Understanding recommendations for meals to include 15-35% energy as protein, 25-35% energy from fat, 35-60% energy from carbohydrates, less than 2040mof dietary cholesterol, 20-35 gm of total fiber daily;Understanding of distribution of calorie intake throughout the day with the consumption of 4-5 meals/snacks   Sedentary Yes   Intervention Provide advice, education,  support and counseling about physical activity/exercise needs.;Develop an individualized exercise prescription for aerobic and resistive training based on initial evaluation findings, risk stratification, comorbidities and participant's personal goals.   Expected Outcomes Achievement of increased cardiorespiratory fitness and enhanced flexibility, muscular endurance and strength shown through measurements of functional capacity and personal statement of participant.   Increase Strength and Stamina Yes   Intervention Provide advice, education, support and counseling about physical activity/exercise needs.;Develop an individualized exercise prescription for aerobic and resistive training based on initial evaluation findings, risk stratification, comorbidities and participant's personal goals.   Expected Outcomes Achievement of increased cardiorespiratory fitness and enhanced flexibility, muscular endurance and strength shown through measurements of functional capacity and personal statement of participant.   Diabetes Yes   Intervention Provide education about signs/symptoms and action to take for hypo/hyperglycemia.;Provide education about proper nutrition, including hydration, and aerobic/resistive exercise prescription along with prescribed medications to achieve blood glucose in normal ranges: Fasting glucose 65-99 mg/dL   Expected Outcomes Short Term: Participant verbalizes understanding of the signs/symptoms and immediate care of hyper/hypoglycemia, proper foot care and importance of medication, aerobic/resistive exercise and nutrition  plan for blood glucose control.   Hypertension Yes   Intervention Provide education on lifestyle modifcations including regular physical activity/exercise, weight management, moderate sodium restriction and increased consumption of fresh fruit, vegetables, and low fat dairy, alcohol moderation, and smoking cessation.;Monitor prescription use compliance.   Expected  Outcomes Short Term: Continued assessment and intervention until BP is < 140/17m HG in hypertensive participants. < 130/850mHG in hypertensive participants with diabetes, heart failure or chronic kidney disease.;Long Term: Maintenance of blood pressure at goal levels.   Lipids Yes   Intervention Provide education and support for participant on nutrition & aerobic/resistive exercise along with prescribed medications to achieve LDL <7037mHDL >67m78m Expected Outcomes Short Term: Participant states understanding of desired cholesterol values and is compliant with medications prescribed. Participant is following exercise prescription and nutrition guidelines.;Long Term: Cholesterol controlled with medications as prescribed, with individualized exercise RX and with personalized nutrition plan. Value goals: LDL < 70mg65mL > 40 mg.   Personal Goal Other Yes   Personal Goal Improve SOB with activities and lose weight. Goal weight at 190   Intervention Provide education on nutrition guidelines for diabetes and heart health. Also develop an exercise program to improve exercise tolerance and capacity.   Expected Outcomes Pt will be able to exercise without difficulty or unsual SOB and lose weight      Core Components/Risk Factors/Patient Goals Review:    Core Components/Risk Factors/Patient Goals at Discharge (Final Review):    ITP Comments:     ITP Comments    Row Name 10/01/15 0830 10/09/15 1346         ITP Comments Dr. TraciFransico Himical Director Patient given handout fot the "Taking the high out of high blood pressure" education class on 10/09/15 and encouraged to watch the accomReadero online. Met outcomes/goals.         Comments:  Pt is making expected progress toward personal goals after completing 5 sessions. Pt is off to a great start.  Pt is very hopeful about his future and positive outlook. No psychosocial concerns at this time. Recommend continued exercise and life style  modification education including  stress management and relaxation techniques to decrease cardiac risk profile.

## 2015-10-16 ENCOUNTER — Encounter (HOSPITAL_COMMUNITY)
Admission: RE | Admit: 2015-10-16 | Discharge: 2015-10-16 | Disposition: A | Discharge status: Home / Self Care | Payer: PRIVATE HEALTH INSURANCE | Source: Ambulatory Visit | Attending: Cardiovascular Disease | Admitting: Cardiovascular Disease

## 2015-10-16 DIAGNOSIS — Z951 Presence of aortocoronary bypass graft: Secondary | ICD-10-CM | POA: Diagnosis not present

## 2015-10-16 DIAGNOSIS — Z9889 Other specified postprocedural states: Secondary | ICD-10-CM

## 2015-10-16 NOTE — Progress Notes (Signed)
Reviewed home exercise with pt today.  Pt plans to walk  for exercise.  Reviewed THR, pulse, RPE, sign and symptoms, and when to call 911 or MD.  Also discussed weather considerations and indoor options.  Pt voiced understanding.    Nathaniel Persons,MS,ACSM RCEP 

## 2015-10-19 ENCOUNTER — Encounter (HOSPITAL_COMMUNITY)
Admission: RE | Admit: 2015-10-19 | Discharge: 2015-10-19 | Disposition: A | Discharge status: Home / Self Care | Payer: PRIVATE HEALTH INSURANCE | Source: Ambulatory Visit | Attending: Cardiovascular Disease | Admitting: Cardiovascular Disease

## 2015-10-19 DIAGNOSIS — Z9889 Other specified postprocedural states: Secondary | ICD-10-CM

## 2015-10-19 DIAGNOSIS — Z951 Presence of aortocoronary bypass graft: Secondary | ICD-10-CM

## 2015-10-20 ENCOUNTER — Encounter (HOSPITAL_COMMUNITY): Payer: PRIVATE HEALTH INSURANCE

## 2015-10-21 ENCOUNTER — Encounter (HOSPITAL_COMMUNITY)
Admission: RE | Admit: 2015-10-21 | Discharge: 2015-10-21 | Disposition: A | Discharge status: Home / Self Care | Payer: PRIVATE HEALTH INSURANCE | Source: Ambulatory Visit | Attending: Cardiovascular Disease | Admitting: Cardiovascular Disease

## 2015-10-21 ENCOUNTER — Encounter: Payer: Self-pay | Admitting: Family Medicine

## 2015-10-21 DIAGNOSIS — Z9889 Other specified postprocedural states: Secondary | ICD-10-CM

## 2015-10-21 DIAGNOSIS — Z951 Presence of aortocoronary bypass graft: Secondary | ICD-10-CM

## 2015-10-22 ENCOUNTER — Encounter (HOSPITAL_COMMUNITY): Payer: PRIVATE HEALTH INSURANCE

## 2015-10-23 ENCOUNTER — Ambulatory Visit: Payer: Self-pay | Admitting: Internal Medicine

## 2015-10-23 ENCOUNTER — Encounter: Payer: Self-pay | Admitting: Internal Medicine

## 2015-10-23 ENCOUNTER — Encounter (HOSPITAL_COMMUNITY)
Admission: RE | Admit: 2015-10-23 | Discharge: 2015-10-23 | Disposition: A | Discharge status: Home / Self Care | Payer: PRIVATE HEALTH INSURANCE | Source: Ambulatory Visit | Attending: Cardiovascular Disease | Admitting: Cardiovascular Disease

## 2015-10-23 ENCOUNTER — Ambulatory Visit (INDEPENDENT_AMBULATORY_CARE_PROVIDER_SITE_OTHER): Payer: PRIVATE HEALTH INSURANCE | Admitting: Internal Medicine

## 2015-10-23 VITALS — BP 114/70 | HR 89 | Ht 73.0 in | Wt 216.0 lb

## 2015-10-23 DIAGNOSIS — Z951 Presence of aortocoronary bypass graft: Secondary | ICD-10-CM

## 2015-10-23 DIAGNOSIS — E1059 Type 1 diabetes mellitus with other circulatory complications: Secondary | ICD-10-CM | POA: Diagnosis not present

## 2015-10-23 DIAGNOSIS — E1065 Type 1 diabetes mellitus with hyperglycemia: Secondary | ICD-10-CM | POA: Diagnosis not present

## 2015-10-23 DIAGNOSIS — Z9889 Other specified postprocedural states: Secondary | ICD-10-CM

## 2015-10-23 MED ORDER — LEVOTHYROXINE SODIUM 137 MCG PO TABS: 137.0000 ug | ORAL_TABLET | Freq: Every day | ORAL | 1 refills | Status: DC

## 2015-10-23 MED ORDER — TADALAFIL 20 MG PO TABS: 20.0000 mg | ORAL_TABLET | Freq: Every day | ORAL | 6 refills | Status: DC | PRN

## 2015-10-23 NOTE — Patient Instructions (Addendum)
Please change: - basal rate:  MN: 1.0 >> 0.9 2 am: 1.0 >> 0.9 7 am: 1.2 >> 1.1 11 am: 1.1 >> 1.2 4 PM: 1.2 11pm 1.1 - ICR:  12 am: 15 6 am: 8 10 am 8 4 pm: 10 >> 9 - ISF:   MN: 30   11 am: 25   6 pm: 30  9 pm: 30  - target:  MN: 120 5:30 am: 110  10 pm: 120  Please contact [employer] about changing the pump.  Please move Multivitamins later in the day.  Take the thyroid hormone every day, with water, at least 30 minutes before breakfast, separated by at least 4 hours from: - acid reflux medications - calcium - iron - multivitamins  Please come back for a follow-up appointment in 3 months

## 2015-10-23 NOTE — Progress Notes (Signed)
Psychosocial Assessment QUALITY OF LIFE SCORE REVIEW  Pt completed Quality of Life survey as a participant in Cardiac Rehab. Scores 21.0 or below are considered low. Pt scored the following     Quality of Life - 10/01/15 1420      Quality of Life Scores   Health/Function Pre 25.27 %   Socioeconomic Pre 26.93 %   Psych/Spiritual Pre 24.5 %   Family Pre 26.4 %   GLOBAL Pre 25.62 %      Patient scored well above the low threshold. Pt exhibits positive coping skills and healthy outlook on life. Pt has supportive family.    Will continue to monitor with periodic check ins and intervene as necessary.  Alanson Alyarlette Aribella Vavra RN, BSN

## 2015-10-23 NOTE — Progress Notes (Signed)
Patient ID: [name], male   DOB: [birth year], 65 y.o.   MRN: [id number]  HPI: [name] is a 65 y.o.-year-old male, returning for f/u for DM1, dx 46 (65 y/o), uncontrolled, with complications (CAD, proliferative diabetic retinopathy-history of retinal detachment OD, right carotid stenosis, cerebrovascular disease - s/p TIA 01/2014 and 05/2015, PVD, peripheral neuropathy, ED) and postsurgical hypothyroidism. Last visit 3 mo ago.  Since last visit, he had a triple CABG with Aortic valve replacement. He feels better. He started cardiac rehab 3x a week MWF (no bolus for b'fast these days) - 6:45 am - bicycle, walks laps 2 mi, new step. He restarts work 10/05/2015.   He had 2 steroid inj's in thumbs last week. Sugars 200-500s.  DM1:  Last hemoglobin A1c was: Lab Results  Component Value Date   HGBA1C 8.0 09/11/2015   HGBA1C 8.4 (H) 07/06/2015   HGBA1C 9.2 (H) 05/07/2015   Pt is on an insulin pump: One Touch Ping ([employer]) with NovoLog. He got a replacement pump in 10/2014.  Pump settings: - basal rate:  MN: 0.8 >> 1.0 2 am: 0.8 >> 1.0 7 am: 1.2 11 am: 1.1 4 PM: 1.1 >> 1.2 11pm 1.0 >> 1.1 - ICR:  12 am: 15 6 am: 8 10 am 8 4 pm: 10 - ISF:   MN: 30   11 am: 25   6 pm: 25 >> 30  9 pm: 40 >> 30 - target:  MN: 120 5:30 am: 110  10 pm: 120 TDD bolus ave 42% (23.1 units) TDD basal ave 58% (31.4 units)  Pt checks his sugars 4.1x a day and they are still high - ave 266+/-113 >> 271 +/-110 >> 257 +/-120 We downloaded his pump and his meter - will scan reports: - am: 56, 62-195, 258, 360 >> 76, 98-253, 397 >> 133-295 >> 191-287, 347 >> 102-190, 254, 336 - 2h after b'fast:130-140 >> 202-367 >> 134, 266 >> 170-309 >> 222-455 >> 150-278, 455 - before lunch: 61, 96-294, 330 >> 147-296, 481 >> 116-309 >> 81-234 >> 169-282, 492, 586 - steroids - 2h after lunch: 324, 325 >> 232-423 >> 100-345 >> 76-252, 348 >> 89, 103-342, 491- steroids - before dinner: 111-262 >> 57x1,  125-403, 454 >> 152, 222-550 (misses lunch insulin!) >> 78, 168-351, 412 - bedtime: 139-333 >> 87, 116-282 >> 108-289 >> 123-356 >> 196-471 >> 166-357 >> 132, 170-401 - nighttime: 115, 122 >> 68 x 1 (after overbolused for a high in the evening), 181-365 >> 66 x1, 146, 215 + few lows. Lowest sugar was 57(after working outside) >> 68 ; he has hypoglycemia awareness at 100. Highest: 439 >> 481 >> 400s (site pb) >> 500s.   Glucometer: [employer].  Pt's meals are: - Breakfast: 1.5 scramble eggs, toast, milk - 65 g carbs - Lunch: sandwich, shaved chicken or [place] breast, cheese, mayo, chips, soda - 100 carbs - Dinner: meat, vegetables, bread, fruit, milk - 100 g carbs - Snacks: 2-3 a day: bagel midmorning, evening snack: chips, popcorn  Pt does have mild chronic kidney disease, last BUN/creatinine was:  Lab Results  Component Value Date   BUN 17 08/10/2015   CREATININE 1.00 08/10/2015  Last microalbumin/creatinine ratio was 3.8.  Last set of lipids: Lab Results  Component Value Date   CHOL 268 (H) 05/07/2015   HDL 67 05/07/2015   LDLCALC 186 (H) 05/07/2015   LDLDIRECT 159.5 04/21/2011   TRIG 74 05/07/2015   CHOLHDL 4.0 05/07/2015  He is not  on a statin, this is listed as an intolerance. He  Pt's last [facility] He has proliferative DR OS, w/o macular edema, s/p detached retina.  Has numbness and tingling in his legs.  He also has a history of hyperlipidemia, disequilibrium, mild cognitive impairment.  Also has hypothyroidism-status post thyroidectomy for multinodular goiter. Last TSH: Lab Results  Component Value Date   TSH 4.22 10/09/2014   He is on LT4 137 mcg daily.  He takes this: - fasting - with water - eats 30 min later - MVI with b'fast! - PPIs prn at night  He also has OSA-on CPAP, GERD.  I reviewed pt's medications, allergies, PMH, social hx, family hx, and changes were documented in the history of present illness.  Otherwise, unchanged from my initial visit note.  ROS: Constitutional: no weight gain, no fatigue, no subjective hyperthermia/hypothermia Eyes: no blurry vision, no xerophthalmia ENT: no sore throat, no nodules palpated in throat, no dysphagia/odynophagia, no hoarseness Cardiovascular: no CP/SOB/palpitations/ leg swelling Respiratory: no cough/SOB Gastrointestinal: no N/V/D/C Musculoskeletal: no muscle aches/joint aches Skin: no rashes Neurological: no tremors/numbness/tingling/dizziness  PE: BP 114/70 (BP Location: Left Arm, Patient Position: Sitting)   Pulse 89   Ht 6\' 1"  (1.854 m)   Wt 216 lb (98 kg)   SpO2 96%   BMI 28.50 kg/m  Body mass index is 28.5 kg/m. Wt Readings from Last 3 Encounters:  10/23/15 216 lb (98 kg)  10/01/15 219 lb 9.3 oz (99.6 kg)  09/29/15 222 lb (100.7 kg)   Constitutional: slightly overweight, in NAD Eyes: PERRLA, EOMI, no exophthalmos ENT: moist mucous membranes, no thyromegaly, no cervical lymphadenopathy Cardiovascular: RRR, no MRG Respiratory: CTA B Gastrointestinal: abdomen soft, NT, ND, BS+ Musculoskeletal: no deformities, strength intact in all 4 Skin: moist, warm, no rashes  ASSESSMENT: 1. DM1, uncontrolled, with complications - CAD, last 2-D echo 05/31/2012: EF 60-65%, mild LVF, moderate aortic stenosis, no LVWMA - cerebrovascular disease - s/p TIA 01/2014 - no neurologic deficit - proliferative diabetic retinopathy-history of retinal detachment OD - right carotid stenosis - had carotid duplex study 05/31/2012 - PVD - peripheral neuropathy - ED  He felt that Humalog was not as efficient with NovoLog and he had to switch back to NovoLog using a preauthorization.  2. Hypothyroidism  PLAN:  1.  Patient with long-standing type 1 diabetes, poorly controlled, with multiple complications, on an insulin pump. At the review of his pump settings today, his sugars are again higher, especially after his steroid shot. He is telling me that he  has to bolus less than the pump is actually telling him as he feels that it over estimates the amount that he should be giving. We calculated together one of his boluses and he is actually right, based on the example that he is giving me. I'm not sure why he is advised to give more. I will advise him to decrease his insulin to carb ratio with dinner since his sugars in the evening are quite high. The CBG start to increase after breakfast. He tells me that he skips the insulin with breakfast if he goes for rehabilitation. He is usually still high after breakfast even if he goes to exercise I advised him to start taking the insulin at that time.  - He also feels that his sugars are dropping too much overnight and he needs to eat a snack at night to prevent the fall. Therefore, I will decrease his basal rates at night  slightly. - Since sugars are so high in the middle of the day, I will increase his basal rates from 11 AM to 4 PM - HbA1c at last visit >> 8% (lower) -  I advised him to change the pump settings as below: Patient Instructions  Please change: - basal rate:  MN: 1.0 >> 0.9 2 am: 1.0 >> 0.9 7 am: 1.2 >> 1.1 11 am: 1.1 >> 1.2 4 PM: 1.2 11pm 1.1 - ICR:  12 am: 15 6 am: 8 10 am 8 4 pm: 10 >> 9 - ISF:   MN: 30   11 am: 25   6 pm: 30  9 pm: 30  - target:  MN: 120 5:30 am: 110  10 pm: 120  Please contact [employer] about changing the pump.  Please move Multivitamins later in the day.  Take the thyroid hormone every day, with water, at least 30 minutes before breakfast, separated by at least 4 hours from: - acid reflux medications - calcium - iron - multivitamins  Please come back for a follow-up appointment in 3 months  - Advised to contact [employer] rep to see if he can switch his pump, since [employer] goes out of business - I will need to put together a letter for [employer] to support the above - Refilled his Cialis - We'll see him back in 3 months  2. Postsurgical  hypothyroidism - We did not address this on previous visits, but he would like a refill today - Last TSH was obtained a year ago and he was at the upper limit of normal - He tells me that he is taking multivitamins in a.m. with breakfast, approximately 30 minutes after his levothyroxine. I advised him to move the multivitamins later (please see above) - Will recheck a TSH at next visit - I refilled his levothyroxine 137 g for now   - time spent with the patient: 40 min, of which >50% was spent in reviewing his pump downloads, discussing his hypo- and hyper-glycemic episodes, reviewing his previous labs and pump settings and developing a plan to avoid hypo- an hyper-glycemia.   Carlus Pavlov, MD PhD [facility] Endocrinology

## 2015-10-26 ENCOUNTER — Encounter: Payer: Self-pay | Admitting: Family Medicine

## 2015-10-26 ENCOUNTER — Encounter (HOSPITAL_COMMUNITY)
Admission: RE | Admit: 2015-10-26 | Discharge: 2015-10-26 | Disposition: A | Discharge status: Home / Self Care | Payer: PRIVATE HEALTH INSURANCE | Source: Ambulatory Visit | Attending: Cardiovascular Disease | Admitting: Cardiovascular Disease

## 2015-10-26 ENCOUNTER — Encounter: Payer: Self-pay | Admitting: Internal Medicine

## 2015-10-26 DIAGNOSIS — Z951 Presence of aortocoronary bypass graft: Secondary | ICD-10-CM | POA: Diagnosis not present

## 2015-10-26 DIAGNOSIS — Z9889 Other specified postprocedural states: Secondary | ICD-10-CM

## 2015-10-27 ENCOUNTER — Encounter (HOSPITAL_COMMUNITY): Payer: PRIVATE HEALTH INSURANCE

## 2015-10-28 ENCOUNTER — Encounter: Payer: Self-pay | Admitting: Neurology

## 2015-10-28 ENCOUNTER — Encounter (HOSPITAL_COMMUNITY)
Admission: RE | Admit: 2015-10-28 | Discharge: 2015-10-28 | Disposition: A | Discharge status: Home / Self Care | Payer: PRIVATE HEALTH INSURANCE | Source: Ambulatory Visit | Attending: Cardiovascular Disease | Admitting: Cardiovascular Disease

## 2015-10-28 DIAGNOSIS — Z9889 Other specified postprocedural states: Secondary | ICD-10-CM

## 2015-10-28 DIAGNOSIS — Z951 Presence of aortocoronary bypass graft: Secondary | ICD-10-CM | POA: Diagnosis not present

## 2015-10-29 ENCOUNTER — Encounter (HOSPITAL_COMMUNITY): Payer: PRIVATE HEALTH INSURANCE

## 2015-10-30 ENCOUNTER — Encounter (HOSPITAL_COMMUNITY)
Admission: RE | Admit: 2015-10-30 | Discharge: 2015-10-30 | Disposition: A | Discharge status: Home / Self Care | Payer: PRIVATE HEALTH INSURANCE | Source: Ambulatory Visit | Attending: Cardiovascular Disease | Admitting: Cardiovascular Disease

## 2015-10-30 DIAGNOSIS — Z951 Presence of aortocoronary bypass graft: Secondary | ICD-10-CM | POA: Diagnosis not present

## 2015-10-30 DIAGNOSIS — Z9889 Other specified postprocedural states: Secondary | ICD-10-CM

## 2015-10-30 NOTE — Progress Notes (Signed)
Pt reports having a brief period where he was having difficulty being able to "word associate". Pt stated that this resolved quickly and he felt okay.  Pt associates this with having vertigo prior to the "episodes".  Pt has had a couple of this over a years period of time.  Pt has not had any workup done for this and wonders should he see an neurology.  Advised pt that a first step would be to see his primary MD for referral to neurologist.  Offered to call primary md office.  Pt politely declined and preferred to contact his primary MD.  Will follow up with pt for accountability of making contact. Alanson Alyarlette Rupinder Livingston RN, BSN

## 2015-10-30 NOTE — Progress Notes (Signed)
[name] 65 y.o. male Nutrition Note Spoke with pt. Nutrition Plan and Nutrition Survey goals reviewed with pt. Pt is following Step 2 of the Therapeutic Lifestyle Changes diet. Pt wants to lose wt and has reportedly lost 15-20 lb. Pt states he weighed 235 lb before surgery. Pt has been trying to lose wt by exercising and changing his diet. Pt is diabetic. Last A1c indicates blood glucose is not optimally controlled. This Clinical research associatewriter went over Diabetes Education test results. Pt reports he checks CBG's 6-7 times a day. Per Dr. Charlean SanfilippoGherghe's note, pt checks CBG's 4.1 times daily.  Pt expressed understanding of the information reviewed. Pt aware of nutrition education classes offered and is unable to attend nutrition classes due to work schedule.  Lab Results  Component Value Date   HGBA1C 8.0 09/11/2015   Wt Readings from Last 3 Encounters:  10/23/15 216 lb (98 kg)  10/01/15 219 lb 9.3 oz (99.6 kg)  09/29/15 222 lb (100.7 kg)    Nutrition Diagnosis ? Food-and nutrition-related knowledge deficit related to lack of exposure to information as related to diagnosis of: ? CVD ? DM ? Overweight related to excessive energy intake as evidenced by a BMI of 29.0  Nutrition Intervention ? Pt's individual nutrition plan reviewed with pt. ? Benefits of adopting Therapeutic Lifestyle Changes discussed when Med[employer]s reviewed. ? Pt to attend the Portion Distortion class ? Pt to attend the Diabetes Q & A class  ? Pt given handouts for: ? Nutrition I class ? Nutrition II class ? Diabetes Blitz Class  ? Continue client-centered nutrition education by RD, as part of interdisciplinary care. Goal(s) ? Pt to identify food quantities necessary to achieve weight loss of 6-24 lb (2.7-10.9 kg) at graduation from cardiac rehab.  ? Use pre-meal and post-meal CBG's and A1c to determine whether adjustments in food/meal planning will be beneficial or if any meds need to be combined with nutrition therapy. Monitor and  Evaluate progress toward nutrition goal with team. [name], M.Ed, RD, LDN, CDE 10/30/2015 8:26 AM

## 2015-11-02 ENCOUNTER — Encounter (HOSPITAL_COMMUNITY)
Admission: RE | Admit: 2015-11-02 | Discharge: 2015-11-02 | Disposition: A | Discharge status: Home / Self Care | Payer: PRIVATE HEALTH INSURANCE | Source: Ambulatory Visit | Attending: Cardiovascular Disease | Admitting: Cardiovascular Disease

## 2015-11-02 DIAGNOSIS — Z951 Presence of aortocoronary bypass graft: Secondary | ICD-10-CM

## 2015-11-02 DIAGNOSIS — Z9889 Other specified postprocedural states: Secondary | ICD-10-CM

## 2015-11-03 ENCOUNTER — Encounter (HOSPITAL_COMMUNITY): Payer: PRIVATE HEALTH INSURANCE

## 2015-11-04 ENCOUNTER — Encounter (HOSPITAL_COMMUNITY)
Admission: RE | Admit: 2015-11-04 | Discharge: 2015-11-04 | Disposition: A | Discharge status: Home / Self Care | Payer: PRIVATE HEALTH INSURANCE | Source: Ambulatory Visit | Attending: Cardiovascular Disease | Admitting: Cardiovascular Disease

## 2015-11-04 DIAGNOSIS — E1140 Type 2 diabetes mellitus with diabetic neuropathy, unspecified: Secondary | ICD-10-CM | POA: Diagnosis not present

## 2015-11-04 DIAGNOSIS — K219 Gastro-esophageal reflux disease without esophagitis: Secondary | ICD-10-CM | POA: Insufficient documentation

## 2015-11-04 DIAGNOSIS — G4733 Obstructive sleep apnea (adult) (pediatric): Secondary | ICD-10-CM | POA: Diagnosis not present

## 2015-11-04 DIAGNOSIS — Z794 Long term (current) use of insulin: Secondary | ICD-10-CM | POA: Insufficient documentation

## 2015-11-04 DIAGNOSIS — E7800 Pure hypercholesterolemia, unspecified: Secondary | ICD-10-CM | POA: Insufficient documentation

## 2015-11-04 DIAGNOSIS — Z9889 Other specified postprocedural states: Secondary | ICD-10-CM

## 2015-11-04 DIAGNOSIS — Z79899 Other long term (current) drug therapy: Secondary | ICD-10-CM | POA: Insufficient documentation

## 2015-11-04 DIAGNOSIS — Z7902 Long term (current) use of antithrombotics/antiplatelets: Secondary | ICD-10-CM | POA: Insufficient documentation

## 2015-11-04 DIAGNOSIS — Z9641 Presence of insulin pump (external) (internal): Secondary | ICD-10-CM | POA: Diagnosis not present

## 2015-11-04 DIAGNOSIS — E113599 Type 2 diabetes mellitus with proliferative diabetic retinopathy without macular edema, unspecified eye: Secondary | ICD-10-CM | POA: Insufficient documentation

## 2015-11-04 DIAGNOSIS — Z87891 Personal history of nicotine dependence: Secondary | ICD-10-CM | POA: Diagnosis not present

## 2015-11-04 DIAGNOSIS — Z8673 Personal history of transient ischemic attack (TIA), and cerebral infarction without residual deficits: Secondary | ICD-10-CM | POA: Insufficient documentation

## 2015-11-04 DIAGNOSIS — I2510 Atherosclerotic heart disease of native coronary artery without angina pectoris: Secondary | ICD-10-CM | POA: Insufficient documentation

## 2015-11-04 DIAGNOSIS — Z953 Presence of xenogenic heart valve: Secondary | ICD-10-CM | POA: Diagnosis not present

## 2015-11-04 DIAGNOSIS — Z951 Presence of aortocoronary bypass graft: Secondary | ICD-10-CM | POA: Insufficient documentation

## 2015-11-04 DIAGNOSIS — E039 Hypothyroidism, unspecified: Secondary | ICD-10-CM | POA: Insufficient documentation

## 2015-11-04 DIAGNOSIS — I872 Venous insufficiency (chronic) (peripheral): Secondary | ICD-10-CM | POA: Diagnosis not present

## 2015-11-04 DIAGNOSIS — Z7982 Long term (current) use of aspirin: Secondary | ICD-10-CM | POA: Insufficient documentation

## 2015-11-05 ENCOUNTER — Encounter (HOSPITAL_COMMUNITY): Payer: PRIVATE HEALTH INSURANCE

## 2015-11-06 ENCOUNTER — Encounter (HOSPITAL_COMMUNITY)
Admission: RE | Admit: 2015-11-06 | Discharge: 2015-11-06 | Disposition: A | Discharge status: Home / Self Care | Payer: PRIVATE HEALTH INSURANCE | Source: Ambulatory Visit | Attending: Cardiovascular Disease | Admitting: Cardiovascular Disease

## 2015-11-06 DIAGNOSIS — Z9889 Other specified postprocedural states: Secondary | ICD-10-CM

## 2015-11-06 DIAGNOSIS — Z951 Presence of aortocoronary bypass graft: Secondary | ICD-10-CM

## 2015-11-09 ENCOUNTER — Encounter (HOSPITAL_COMMUNITY)
Admission: RE | Admit: 2015-11-09 | Discharge: 2015-11-09 | Disposition: A | Discharge status: Home / Self Care | Payer: PRIVATE HEALTH INSURANCE | Source: Ambulatory Visit | Attending: Cardiovascular Disease | Admitting: Cardiovascular Disease

## 2015-11-09 DIAGNOSIS — Z951 Presence of aortocoronary bypass graft: Secondary | ICD-10-CM | POA: Diagnosis not present

## 2015-11-09 DIAGNOSIS — Z9889 Other specified postprocedural states: Secondary | ICD-10-CM

## 2015-11-10 ENCOUNTER — Encounter (HOSPITAL_COMMUNITY): Payer: PRIVATE HEALTH INSURANCE

## 2015-11-11 ENCOUNTER — Encounter (HOSPITAL_COMMUNITY)
Admission: RE | Admit: 2015-11-11 | Discharge: 2015-11-11 | Disposition: A | Discharge status: Home / Self Care | Payer: PRIVATE HEALTH INSURANCE | Source: Ambulatory Visit | Attending: Cardiovascular Disease | Admitting: Cardiovascular Disease

## 2015-11-11 DIAGNOSIS — Z9889 Other specified postprocedural states: Secondary | ICD-10-CM

## 2015-11-11 DIAGNOSIS — Z951 Presence of aortocoronary bypass graft: Secondary | ICD-10-CM

## 2015-11-11 LAB — GLUCOSE, CAPILLARY: Glucose-Capillary: 258 mg/dL — ABNORMAL HIGH (ref 65–99)

## 2015-11-12 ENCOUNTER — Telehealth (HOSPITAL_COMMUNITY): Payer: Self-pay

## 2015-11-12 ENCOUNTER — Encounter (HOSPITAL_COMMUNITY): Payer: PRIVATE HEALTH INSURANCE

## 2015-11-12 NOTE — Progress Notes (Signed)
Cardiac Individual Treatment Plan  Patient Details  Name: [name] MRN: [id number] Date of Birth: 08/31/1950 Referring Provider:   Flowsheet Row CARDIAC REHAB PHASE II ORIENTATION from 10/01/2015 in [facility]  Referring Provider  Berry, Jonathon, MD      Initial Encounter Date:  Flowsheet Row CARDIAC REHAB PHASE II ORIENTATION from 10/01/2015 in [facility] CARD[facility]  Date  10/01/15  Referring Provider  Berry, Jonathon, MD      Visit Diagnosis: 07/09/15 S/P CABG x 3  07/09/15 S/P aortic valve repair  Patient's Home Medications on Admission:  Current Outpatient Prescriptions:  .  aspirin EC 81 MG tablet, Take 81 mg by mouth daily., Disp: , Rfl:  .  clopidogrel (PLAVIX) 75 MG tablet, Take 1 tablet (75 mg total) by mouth daily., Disp: 90 tablet, Rfl: 3 .  clotrimazole-betamethasone (LOTRISONE) lotion, Apply 1 application topically daily as needed (rash)., Disp: , Rfl:  .  FEVERFEW PO, Take 1 capsule by mouth daily. To prevent migraines, Disp: , Rfl:  .  Insulin Human (INSULIN PUMP) SOLN, Inject into the skin continuous. Basal rate .95/hr, current pump settings: 12am .7, 2am 1, 7am 1.1, 11am .9, 5pm .9.25, 11pm .85. Uses Novolog Insulin., Disp: , Rfl:  .  levothyroxine (SYNTHROID, LEVOTHROID) 137 MCG tablet, Take 1 tablet (137 mcg total) by mouth daily., Disp: 90 tablet, Rfl: 1 .  Multiple Vitamins-Minerals (ONE-A-DAY MENS 50+ ADVANTAGE) TABS, Take 1 tablet by mouth daily with breakfast., Disp: , Rfl:  .  ranitidine (ZANTAC) 150 MG tablet, Take 150 mg by mouth at bedtime as needed for heartburn. , Disp: , Rfl:  .  tadalafil (CIALIS) 20 MG tablet, Take 1 tablet (20 mg total) by mouth daily as needed for erectile dysfunction., Disp: 15 tablet, Rfl: 6  Past Medical History: Past Medical History:  Diagnosis Date  . Abnormal stress test 10/27/2011   lat isch--cardiac cath 10/28/2011  . Aortic stenosis, moderate Sept/Oct /2013   a.  Mild/mod by echo; mild by cath 10/28/11; b. Mild progression by echo 05/2012; c. Further mild progression on 01/2014 echo (valve area 0.93 cm2).  d. Further progression on echo 01/2015; e. 07/2015 s/p Edwards Magna Ease bovine pericardial valve model 3300 TFX, ser # [id number].  . 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 Inferolateral perfusion defect + EKG abnormality during stress testing prompted Cath by [facility]: 3V CAD, EF normal.  Med mgmt recommended; b. 06/2015 Cath: 3VD and mod AS; c. 07/2015 CABG x 3 (LIMA->LAD, VG->OM2, VG->PDA) w/ AVR.  . Carotid stenosis, right 09/2011   Dr. Berry did R carotid stenting 01/2014.  Carotid dopplers 06/2015 essentially normal.  . Complication of anesthesia    difficulty with intubation,   . Decreased pedal pulses    LE doppler 11/08/11- no evidence of arterial insufficiency  . Diabetes mellitus Dx'd age 11   Novolog via insulin pump; sub-optimal control x years  . Diabetic neuropathy (HCC)    fine touch and position sense affected  . Diabetic retinopathy    Proliferative: Hx of retinal detachment on right-light perception only in right eye  . Diastolic dysfunction 2007; 2016   TEE with diastolic dysfunction, EF 74%.  . Difficult intubation    ~ 2002 difficult fiberoptic intubation with takeback bleeding s/p thyroidectomy;  for thyroidectomy was intubated DL X 1 with cricoid pressure but difficult mask (full beard)  . Dizziness    feels off balance  . Erectile dysfunction      Se[place] urologist in [place]  . GERD (gastroesophageal reflux disease)   . High cholesterol    a. intolerant of statins/zeta.  . History of tobacco abuse    Quit about 1990 (has 35 pack-yr hx)  . Hypothyroidism, postsurgical    Thyroidectomy 2002; multinodular goiter.  Dr. Gherghe managing this as of 10/2015.  . Impaired vision    right eye light perception only; hx of retinal detachment.  . Impingement syndrome of right shoulder    08/26/15 Pt got subacromial steroid injection by orthopedist (Dr.  John Ritchie).  . Neuromuscular disorder (HCC)    neuropathy  . OSA (obstructive sleep apnea)    06/2011 sleep study: moderate OSA, CPAP at 12 cm H2O.  . Osteoarthritis    Bilat thumb carpometacarpal joints.  Ortho injected each thumb x 2 in 2017.  . Recurrent pneumonia   . Shortness of breath dyspnea   . Stroke (HCC)    TIA  . TIA (transient ischemic attack) 2015/16   with R carotid dz: pt got R carotid stent 01/2014 by Dr. Berry and was put on dual antiplatelet therapy with ASA 325mg and plavix 75mg qd afterwards  . TIA (transient ischemic attack) 05/2015   Left brain (right sided numbness + slurred speech)  Admitted for obs/workup 05/2015.  . Venous insufficiency   . Venous reflux 10/14/2011   venous doppler-R GSV continuous reflux throughout; too small for VNUS closure    Tobacco Use: History  Smoking Status  . Former Smoker  Smokeless Tobacco  . Former User  . Quit date: 06/22/1987    Labs: Recent Review Flowsheet Data    Labs for ITP Cardiac and Pulmonary Rehab Latest Ref Rng & Units 07/09/2015 07/09/2015 07/10/2015 07/20/2015 09/11/2015   Cholestrol 0 - 200 mg/dL - - - - -   LDLCALC 0 - 99 mg/dL - - - - -   LDLDIRECT mg/dL - - - - -   HDL >40 mg/dL - - - - -   Trlycerides <150 mg/dL - - - - -   Hemoglobin A1c - - - - - 8.0   PHART 7.350 - 7.450 7.352 - - - -   PCO2ART 35.0 - 45.0 mmHg 44.3 - - - -   HCO3 20.0 - 24.0 mEq/L 24.6(H) - - - -   TCO2 0 - 100 mmol/L 26 24 26 29 -   ACIDBASEDEF 0.0 - 2.0 mmol/L 1.0 - - - -   O2SAT % 96.0 - - - -      Capillary Blood Glucose: Lab Results  Component Value Date   GLUCAP 258 (H) 11/11/2015   GLUCAP 150 (H) 10/12/2015   GLUCAP 227 (H) 10/09/2015   GLUCAP 261 (H) 10/09/2015   GLUCAP 259 (H) 10/07/2015     Exercise Target Goals:    Exercise Program Goal: Individual exercise prescription set with THRR, safety & activity barriers. Participant demonstrates ability to understand and report RPE using BORG scale, to self-measure  pulse accurately, and to acknowledge the importance of the exercise prescription.  Exercise Prescription Goal: Starting with aerobic activity 30 plus minutes a day, 3 days per week for initial exercise prescription. Provide home exercise prescription and guidelines that participant acknowledges understanding prior to discharge.  Activity Barriers & Risk Stratification:     Activity Barriers & Cardiac Risk Stratification - 10/01/15 1554      Activity Barriers & Cardiac Risk Stratification   Activity Barriers History of Falls;Balance Concerns   Comments history of trips and falls,   diabetic neuropathy in his lower legs and feel, history of TIA      6 Minute Walk:     6 Minute Walk    Row Name 10/01/15 1415         6 Minute Walk   Phase Initial     Distance 1417 feet     Walk Time 6 minutes     # of Rest Breaks 0     MPH 2.7     METS 3.3     RPE 12     VO2 Peak 11.42     Symptoms No     Resting HR 90 bpm     Resting BP 118/62     Max Ex. HR 119 bpm     Max Ex. BP 138/62     2 Minute Post BP 126/62        Initial Exercise Prescription:     Initial Exercise Prescription - 10/01/15 1400      Date of Initial Exercise RX and Referring Provider   Date 10/01/15   Referring Provider Berry, Jonathon, MD     Bike   Level 0.8   Minutes 10   METs 2.51     NuStep   Level 3   Minutes 10   METs 2     Track   Laps 11   Minutes 10   METs 2.92     Prescription Details   Frequency (times per week) 3   Duration Progress to 30 minutes of continuous aerobic without signs/symptoms of physical distress     Intensity   THRR 40-80% of Max Heartrate 61-122   Ratings of Perceived Exertion 11-13   Perceived Dyspnea 0-4     Progression   Progression Continue to progress workloads to maintain intensity without signs/symptoms of physical distress.     Resistance Training   Training Prescription Yes   Weight 2lbs   Reps 10-12      Perform Capillary Blood Glucose checks  as needed.  Exercise Prescription Changes:     Exercise Prescription Changes    Row Name 10/16/15 0700 10/30/15 1600           Exercise Review   Progression Yes Yes        Response to Exercise   Blood Pressure (Admit) 118/60 120/62      Blood Pressure (Exercise) 130/68 148/56      Blood Pressure (Exit) 138/78 128/62      Heart Rate (Admit) 95 bpm 98 bpm      Heart Rate (Exercise) 124 bpm 136 bpm      Heart Rate (Exit) 94 bpm 102 bpm      Rating of Perceived Exertion (Exercise) 12 13      Comments Reviewed HEP on 10/16/15 Reviewed HEP on 10/16/15      Duration Progress to 30 minutes of continuous aerobic without signs/symptoms of physical distress Progress to 30 minutes of continuous aerobic without signs/symptoms of physical distress      Intensity THRR unchanged THRR unchanged        Progression   Progression Continue to progress workloads to maintain intensity without signs/symptoms of physical distress. Continue to progress workloads to maintain intensity without signs/symptoms of physical distress.      Average METs 2.8 3.26        Resistance Training   Training Prescription Yes Yes      Weight 2lbs 5lbs      Reps 10-12 10-12          Interval Training   Interval Training No No        Bike   Level 1 1      Minutes 10 10      METs 2.52 2.52        NuStep   Level 3 3      Minutes 10 10      METs 2.5 2.5        Track   Laps 12 12      Minutes 10 10      METs 3.09 3.09        Home Exercise Plan   Plans to continue exercise at Home  Reviewed on 10/16/15. See progress note Home  Reviewed on 10/16/15. See progress note      Frequency Add 2 additional days to program exercise sessions. Add 2 additional days to program exercise sessions.         Exercise Comments:     Exercise Comments    Row Name 10/16/15 0807           Exercise Comments Reviewed METs and goals. Pt is tolerating exercise well; will continue to monitor exercise progression.           Discharge Exercise Prescription (Final Exercise Prescription Changes):     Exercise Prescription Changes - 10/30/15 1600      Exercise Review   Progression Yes     Response to Exercise   Blood Pressure (Admit) 120/62   Blood Pressure (Exercise) 148/56   Blood Pressure (Exit) 128/62   Heart Rate (Admit) 98 bpm   Heart Rate (Exercise) 136 bpm   Heart Rate (Exit) 102 bpm   Rating of Perceived Exertion (Exercise) 13   Comments Reviewed HEP on 10/16/15   Duration Progress to 30 minutes of continuous aerobic without signs/symptoms of physical distress   Intensity THRR unchanged     Progression   Progression Continue to progress workloads to maintain intensity without signs/symptoms of physical distress.   Average METs 3.26     Resistance Training   Training Prescription Yes   Weight 5lbs   Reps 10-12     Interval Training   Interval Training No     Bike   Level 1   Minutes 10   METs 2.52     NuStep   Level 3   Minutes 10   METs 2.5     Track   Laps 12   Minutes 10   METs 3.09     Home Exercise Plan   Plans to continue exercise at Home  Reviewed on 10/16/15. See progress note   Frequency Add 2 additional days to program exercise sessions.      Nutrition:  Target Goals: Understanding of nutrition guidelines, daily intake of sodium <1500mg, cholesterol <200mg, calories 30% from fat and 7% or less from saturated fats, daily to have 5 or more servings of fruits and vegetables.  Biometrics:     Pre Biometrics - 10/01/15 1419      Pre Biometrics   Waist Circumference 43.5 inches   Hip Circumference 44 inches   Waist to Hip Ratio 0.99 %   Triceps Skinfold 23 mm   % Body Fat 30.9 %   Grip Strength 37.5 kg   Flexibility 7.5 in   Single Leg Stand 1.23 seconds       Nutrition Therapy Plan and Nutrition Goals:     Nutrition Therapy & Goals - 10/05/15 1134      Nutrition Therapy     Diet Carb Modified, Therapeutic Lifestyle Changes     Personal  Nutrition Goals   Personal Goal #1 1-2 lb wt loss/week to a wt loss goal of 6-24 lb   Personal Goal #2 Improved glycemic control as evidenced by a decrease in A1c from 8.0 toward less than 7.0     Intervention Plan   Intervention Prescribe, educate and counsel regarding individualized specific dietary modifications aiming towards targeted core components such as weight, hypertension, lipid management, diabetes, heart failure and other comorbidities.   Expected Outcomes Short Term Goal: Understand basic principles of dietary content, such as calories, fat, sodium, cholesterol and nutrients.;Long Term Goal: Adherence to prescribed nutrition plan.      Nutrition Discharge: Nutrition Scores:     Nutrition Assessments - 10/05/15 1134      MEDFICTS Scores   Pre Score 12      Nutrition Goals Re-Evaluation:   Psychosocial: Target Goals: Acknowledge presence or absence of depression, maximize coping skills, provide positive support system. Participant is able to verbalize types and ability to use techniques and skills needed for reducing stress and depression.  Initial Review & Psychosocial Screening:     Initial Psych Review & Screening - 10/01/15 1555      Initial Review   Comments Pt is returning to work on 10/2.  Pt has some anxiety regarding returning back to work. Ideally pt would like to retire but financially will need to continue to work additional 7 years.      Quality of Life Scores:     Quality of Life - 10/01/15 1420      Quality of Life Scores   Health/Function Pre 25.27 %   Socioeconomic Pre 26.93 %   Psych/Spiritual Pre 24.5 %   Family Pre 26.4 %   GLOBAL Pre 25.62 %      PHQ-9: Recent Review Flowsheet Data    Depression screen [facility] 2/9 10/05/2015 10/09/2014   Decreased Interest 0 0   Down, Depressed, Hopeless 0 0   PHQ - 2 Score 0 0      Psychosocial Evaluation and Intervention:     Psychosocial Evaluation - 11/12/15 1211      Psychosocial  Evaluation & Interventions   Interventions Encouraged to exercise with the program and follow exercise prescription   Comments Pt has supportive family and demonstrates positve and healthy outlook on his recovery from his cardiac event.     Discharge Psychosocial Assessment & Intervention   Discharge Continue support measures as needed      Psychosocial Re-Evaluation:     Psychosocial Re-Evaluation    [place] Name 11/12/15 1211             Psychosocial Re-Evaluation   Interventions Encouraged to attend Cardiac Rehabilitation for the exercise          Vocational Rehabilitation: Provide vocational rehab assistance to qualifying candidates.   Vocational Rehab Evaluation & Intervention:     Vocational Rehab - 10/01/15 1557      Initial Vocational Rehab Evaluation & Intervention   Assessment shows need for Vocational Rehabilitation No     Discharge Vocational Rehab   Discharge Vocational Rehabilitation Pt plans to return back to work on 10/2. Pt anticipates no issues/      Education: Education Goals: Education classes will be provided on a weekly basis, covering required topics. Participant will state understanding/return demonstration of topics presented.  Learning Barriers/Preferences:     Learning Barriers/Preferences - 10/01/15 7672      Learning Barriers/Preferences  Learning Barriers Sight   Learning Preferences Written Material      Education Topics: Count Your Pulse:  -Group instruction provided by verbal instruction, demonstration, patient participation and written materials to support subject.  Instructors address importance of being able to find your pulse and how to count your pulse when at home without a heart monitor.  Patients get hands on experience counting their pulse with staff help and individually.   Heart Attack, Angina, and Risk Factor Modification:  -Group instruction provided by verbal instruction, video, and written materials to support  subject.  Instructors address signs and symptoms of angina and heart attacks.    Also discuss risk factors for heart disease and how to make changes to improve heart health risk factors.   Functional Fitness:  -Group instruction provided by verbal instruction, demonstration, patient participation, and written materials to support subject.  Instructors address safety measures for doing things around the house.  Discuss how to get up and down off the floor, how to pick things up properly, how to safely get out of a chair without assistance, and balance training.   Meditation and Mindfulness:  -Group instruction provided by verbal instruction, patient participation, and written materials to support subject.  Instructor addresses importance of mindfulness and meditation practice to help reduce stress and improve awareness.  Instructor also leads participants through a meditation exercise.    Stretching for Flexibility and Mobility:  -Group instruction provided by verbal instruction, patient participation, and written materials to support subject.  Instructors lead participants through series of stretches that are designed to increase flexibility thus improving mobility.  These stretches are additional exercise for major muscle groups that are typically performed during regular warm up and cool down.   Hands Only CPR Anytime:  -Group instruction provided by verbal instruction, video, patient participation and written materials to support subject.  Instructors co-teach with AHA video for hands only CPR.  Participants get hands on experience with mannequins.   Nutrition I class: Heart Healthy Eating:  -Group instruction provided by PowerPoint slides, verbal discussion, and written materials to support subject matter. The instructor gives an explanation and review of the Therapeutic Lifestyle Changes diet recommendations, which includes a discussion on lipid goals, dietary fat, sodium, fiber, plant  stanol/sterol esters, sugar, and the components of a well-balanced, healthy diet. Flowsheet Row CARDIAC REHAB PHASE II EXERCISE from 10/30/2015 in [place]  Date  10/30/15  Educator  RD  Instruction Review Code  Not applicable [class handouts given]      Nutrition II class: Lifestyle Skills:  -Group instruction provided by PowerPoint slides, verbal discussion, and written materials to support subject matter. The instructor gives an explanation and review of label reading, grocery shopping for heart health, heart healthy recipe modifications, and ways to make healthier choices when eating out. Flowsheet Row CARDIAC REHAB PHASE II EXERCISE from 10/30/2015 in [place]  Date  10/30/15  Educator  RD  Instruction Review Code  Not applicable [class handouts given]      Diabetes Question & Answer:  -Group instruction provided by PowerPoint slides, verbal discussion, and written materials to support subject matter. The instructor gives an explanation and review of diabetes co-morbidities, pre- and post-prandial blood glucose goals, pre-exercise blood glucose goals, signs, symptoms, and treatment of hypoglycemia and hyperglycemia, and foot care basics.   Diabetes Blitz:  -Group instruction provided by PowerPoint slides, verbal discussion, and written materials to support subject matter. The instructor gives  an explanation and review of the physiology behind type 1 and type 2 diabetes, diabetes medications and rational behind using different medications, pre- and post-prandial blood glucose recommendations and Hemoglobin A1c goals, diabetes diet, and exercise including blood glucose guidelines for exercising safely.  Flowsheet Row CARDIAC REHAB PHASE II EXERCISE from 10/30/2015 in [place]  Date  10/30/15  Educator  RD  Instruction Review Code  Not applicable [class handouts given]      Portion  Distortion:  -Group instruction provided by PowerPoint slides, verbal discussion, written materials, and food models to support subject matter. The instructor gives an explanation of serving size versus portion size, changes in portions sizes over the last 20 years, and what consists of a serving from each food group. Flowsheet Row CARDIAC REHAB PHASE II EXERCISE from 10/30/2015 in [place]  Date  10/07/15  Educator  RD  Instruction Review Code  2- meets goals/outcomes      Stress Management:  -Group instruction provided by verbal instruction, video, and written materials to support subject matter.  Instructors review role of stress in heart disease and how to cope with stress positively.     Exercising on Your Own:  -Group instruction provided by verbal instruction, power point, and written materials to support subject.  Instructors discuss benefits of exercise, components of exercise, frequency and intensity of exercise, and end points for exercise.  Also discuss use of nitroglycerin and activating EMS.  Review options of places to exercise outside of rehab.  Review guidelines for sex with heart disease.   Cardiac Drugs I:  -Group instruction provided by verbal instruction and written materials to support subject.  Instructor reviews cardiac drug classes: antiplatelets, anticoagulants, beta blockers, and statins.  Instructor discusses reasons, side effects, and lifestyle considerations for each drug class.   Cardiac Drugs II:  -Group instruction provided by verbal instruction and written materials to support subject.  Instructor reviews cardiac drug classes: angiotensin converting enzyme inhibitors (ACE-I), angiotensin II receptor blockers (ARBs), nitrates, and calcium channel blockers.  Instructor discusses reasons, side effects, and lifestyle considerations for each drug class.   Anatomy and Physiology of the Circulatory System:  -Group instruction  provided by verbal instruction, video, and written materials to support subject.  Reviews functional anatomy of heart, how it relates to various diagnoses, and what role the heart plays in the overall system.   Knowledge Questionnaire Score:     Knowledge Questionnaire Score - 10/01/15 1415      Knowledge Questionnaire Score   Pre Score 22/24      Core Components/Risk Factors/Patient Goals at Admission:     Personal Goals and Risk Factors at Admission - 10/01/15 0835      Core Components/Risk Factors/Patient Goals on Admission    Weight Management Yes;Weight Loss   Intervention Weight Management: Develop a combined nutrition and exercise program designed to reach desired caloric intake, while maintaining appropriate intake of nutrient and fiber, sodium and fats, and appropriate energy expenditure required for the weight goal.;Weight Management: Provide education and appropriate resources to help participant work on and attain dietary goals.;Weight Management/Obesity: Establish reasonable short term and long term weight goals.   Expected Outcomes Long Term: Adherence to nutrition and physical activity/exercise program aimed toward attainment of established weight goal;Short Term: Continue to assess and modify interventions until short term weight is achieved;Weight Maintenance: Understanding of the daily nutrition guidelines, which includes 25-35% calories from fat, 7% or less cal from saturated  fats, less than 200mg cholesterol, less than 1.5gm of sodium, & 5 or more servings of fruits and vegetables daily;Weight Loss: Understanding of general recommendations for a balanced deficit meal plan, which promotes 1-2 lb weight loss per week and includes a negative energy balance of 500-1000 kcal/d;Understanding recommendations for meals to include 15-35% energy as protein, 25-35% energy from fat, 35-60% energy from carbohydrates, less than 200mg of dietary cholesterol, 20-35 gm of total fiber  daily;Understanding of distribution of calorie intake throughout the day with the consumption of 4-5 meals/snacks   Sedentary Yes   Intervention Provide advice, education, support and counseling about physical activity/exercise needs.;Develop an individualized exercise prescription for aerobic and resistive training based on initial evaluation findings, risk stratification, comorbidities and participant's personal goals.   Expected Outcomes Achievement of increased cardiorespiratory fitness and enhanced flexibility, muscular endurance and strength shown through measurements of functional capacity and personal statement of participant.   Increase Strength and Stamina Yes   Intervention Provide advice, education, support and counseling about physical activity/exercise needs.;Develop an individualized exercise prescription for aerobic and resistive training based on initial evaluation findings, risk stratification, comorbidities and participant's personal goals.   Expected Outcomes Achievement of increased cardiorespiratory fitness and enhanced flexibility, muscular endurance and strength shown through measurements of functional capacity and personal statement of participant.   Diabetes Yes   Intervention Provide education about signs/symptoms and action to take for hypo/hyperglycemia.;Provide education about proper nutrition, including hydration, and aerobic/resistive exercise prescription along with prescribed medications to achieve blood glucose in normal ranges: Fasting glucose 65-99 mg/dL   Expected Outcomes Short Term: Participant verbalizes understanding of the signs/symptoms and immediate care of hyper/hypoglycemia, proper foot care and importance of medication, aerobic/resistive exercise and nutrition plan for blood glucose control.   Hypertension Yes   Intervention Provide education on lifestyle modifcations including regular physical activity/exercise, weight management, moderate sodium  restriction and increased consumption of fresh fruit, vegetables, and low fat dairy, alcohol moderation, and smoking cessation.;Monitor prescription use compliance.   Expected Outcomes Short Term: Continued assessment and intervention until BP is < 140/90mm HG in hypertensive participants. < 130/80mm HG in hypertensive participants with diabetes, heart failure or chronic kidney disease.;Long Term: Maintenance of blood pressure at goal levels.   Lipids Yes   Intervention Provide education and support for participant on nutrition & aerobic/resistive exercise along with prescribed medications to achieve LDL <70mg, HDL >40mg.   Expected Outcomes Short Term: Participant states understanding of desired cholesterol values and is compliant with medications prescribed. Participant is following exercise prescription and nutrition guidelines.;Long Term: Cholesterol controlled with medications as prescribed, with individualized exercise RX and with personalized nutrition plan. Value goals: LDL < 70mg, HDL > 40 mg.   Personal Goal Other Yes   Personal Goal Improve SOB with activities and lose weight. Goal weight at 190   Intervention Provide education on nutrition guidelines for diabetes and heart health. Also develop an exercise program to improve exercise tolerance and capacity.   Expected Outcomes Pt will be able to exercise without difficulty or unsual SOB and lose weight      Core Components/Risk Factors/Patient Goals Review:      Goals and Risk Factor Review    Row Name 10/16/15 0819 10/30/15 1650           Core Components/Risk Factors/Patient Goals Review   Personal Goals Review Improve shortness of breath with ADL's Other;Improve shortness of breath with ADL's      Review Pt is experiencing less SOB with activities and   experiencing less vertigo symptoms Pt stated that SOB has improved and is able to tolerate exercise very well in cardiac rehab      Expected Outcomes Pt will continue to do  exercises and perform activities with less SOB Pt will continue to increase aerobic capacity and challenge CV system; will request a THR increase from cardiologist         Core Components/Risk Factors/Patient Goals at Discharge (Final Review):      Goals and Risk Factor Review - 10/30/15 1650      Core Components/Risk Factors/Patient Goals Review   Personal Goals Review Other;Improve shortness of breath with ADL's   Review Pt stated that SOB has improved and is able to tolerate exercise very well in cardiac rehab   Expected Outcomes Pt will continue to increase aerobic capacity and challenge CV system; will request a THR increase from cardiologist      ITP Comments:     ITP Comments    Row Name 10/01/15 0830 10/09/15 1346         ITP Comments Dr. Traci Turner, Medical Director Patient given handout fot the "Taking the high out of high blood pressure" education class on 10/09/15 and encouraged to watch the accompnaying video online. Met outcomes/goals.         Comments:  Pt is making expected progress toward personal goals after completing 17 sessions. Pt needs encouragement to pursue work up for possible TIA events.  Pt has not been accountable for following through with appt from his family doctor.  Pt has not mentioned any further "episode".  Pt with improved management of his diabetes.  Pt has returned to work and at times has some difficulties with the work load.  Psychosocial Assessment -No further psychosocial needed identified, no further intervention warranted. Recommend continued exercise and life style modification education including  stress management and relaxation techniques to decrease cardiac risk profile.   

## 2015-11-12 NOTE — Telephone Encounter (Signed)
-----   Message from Runell GessJonathan J Berry, MD sent at 11/09/2015  2:26 PM EST ----- Regarding: RE: Theodore Demarkichard Voisin That's fine with me.  JJB ----- Message ----- From: Ples SpecterAshley L Keir Foland Sent: 11/04/2015   1:19 PM To: Runell GessJonathan J Berry, MD Subject: Theodore Demarkichard Sennett                                 Hi Dr. Allyson SabalBerry, I would like to ask permission to increase Mr. Assunta Foundarkes THRR during exercise?  Currently his 40-80% of maxHR is 61-122 and he frequently gets in the 130s during exercise and is asymptomatic.  I would like to request a 15 bpm increase (137)?    Thanks, Rosine DoorAshley Zakiya Sporrer, MS Exercise Physiologist, CRPII 11/04/2015 13:23

## 2015-11-13 ENCOUNTER — Encounter (HOSPITAL_COMMUNITY)
Admission: RE | Admit: 2015-11-13 | Discharge: 2015-11-13 | Disposition: A | Discharge status: Home / Self Care | Payer: PRIVATE HEALTH INSURANCE | Source: Ambulatory Visit | Attending: Cardiovascular Disease | Admitting: Cardiovascular Disease

## 2015-11-13 DIAGNOSIS — Z9889 Other specified postprocedural states: Secondary | ICD-10-CM

## 2015-11-13 DIAGNOSIS — Z951 Presence of aortocoronary bypass graft: Secondary | ICD-10-CM

## 2015-11-16 ENCOUNTER — Encounter (HOSPITAL_COMMUNITY)
Admission: RE | Admit: 2015-11-16 | Discharge: 2015-11-16 | Disposition: A | Discharge status: Home / Self Care | Payer: PRIVATE HEALTH INSURANCE | Source: Ambulatory Visit | Attending: Cardiovascular Disease | Admitting: Cardiovascular Disease

## 2015-11-16 DIAGNOSIS — Z9889 Other specified postprocedural states: Secondary | ICD-10-CM

## 2015-11-16 DIAGNOSIS — Z951 Presence of aortocoronary bypass graft: Secondary | ICD-10-CM | POA: Diagnosis not present

## 2015-11-17 ENCOUNTER — Encounter (HOSPITAL_COMMUNITY): Payer: PRIVATE HEALTH INSURANCE

## 2015-11-18 ENCOUNTER — Encounter (HOSPITAL_COMMUNITY)
Admission: RE | Admit: 2015-11-18 | Discharge: 2015-11-18 | Disposition: A | Discharge status: Home / Self Care | Payer: PRIVATE HEALTH INSURANCE | Source: Ambulatory Visit | Attending: Cardiovascular Disease | Admitting: Cardiovascular Disease

## 2015-11-18 DIAGNOSIS — Z9889 Other specified postprocedural states: Secondary | ICD-10-CM

## 2015-11-18 DIAGNOSIS — Z951 Presence of aortocoronary bypass graft: Secondary | ICD-10-CM | POA: Diagnosis not present

## 2015-11-19 ENCOUNTER — Encounter (HOSPITAL_COMMUNITY): Payer: PRIVATE HEALTH INSURANCE

## 2015-11-20 ENCOUNTER — Encounter (HOSPITAL_COMMUNITY)
Admission: RE | Admit: 2015-11-20 | Discharge: 2015-11-20 | Disposition: A | Discharge status: Home / Self Care | Payer: PRIVATE HEALTH INSURANCE | Source: Ambulatory Visit | Attending: Cardiovascular Disease | Admitting: Cardiovascular Disease

## 2015-11-20 DIAGNOSIS — Z951 Presence of aortocoronary bypass graft: Secondary | ICD-10-CM

## 2015-11-20 DIAGNOSIS — Z9889 Other specified postprocedural states: Secondary | ICD-10-CM

## 2015-11-23 ENCOUNTER — Encounter (HOSPITAL_COMMUNITY)
Admission: RE | Admit: 2015-11-23 | Discharge: 2015-11-23 | Disposition: A | Discharge status: Home / Self Care | Payer: PRIVATE HEALTH INSURANCE | Source: Ambulatory Visit | Attending: Cardiovascular Disease | Admitting: Cardiovascular Disease

## 2015-11-23 DIAGNOSIS — Z9889 Other specified postprocedural states: Secondary | ICD-10-CM

## 2015-11-23 DIAGNOSIS — Z951 Presence of aortocoronary bypass graft: Secondary | ICD-10-CM

## 2015-11-24 ENCOUNTER — Encounter (HOSPITAL_COMMUNITY): Payer: PRIVATE HEALTH INSURANCE

## 2015-11-25 ENCOUNTER — Encounter (HOSPITAL_COMMUNITY)
Admission: RE | Admit: 2015-11-25 | Discharge: 2015-11-25 | Disposition: A | Discharge status: Home / Self Care | Payer: PRIVATE HEALTH INSURANCE | Source: Ambulatory Visit | Attending: Cardiovascular Disease | Admitting: Cardiovascular Disease

## 2015-11-25 DIAGNOSIS — Z951 Presence of aortocoronary bypass graft: Secondary | ICD-10-CM | POA: Diagnosis not present

## 2015-11-25 DIAGNOSIS — Z9889 Other specified postprocedural states: Secondary | ICD-10-CM

## 2015-11-26 ENCOUNTER — Encounter (HOSPITAL_COMMUNITY): Payer: PRIVATE HEALTH INSURANCE

## 2015-11-30 ENCOUNTER — Encounter (HOSPITAL_COMMUNITY): Payer: PRIVATE HEALTH INSURANCE

## 2015-12-01 ENCOUNTER — Encounter (HOSPITAL_COMMUNITY): Payer: PRIVATE HEALTH INSURANCE

## 2015-12-02 ENCOUNTER — Encounter (HOSPITAL_COMMUNITY)
Admission: RE | Admit: 2015-12-02 | Discharge: 2015-12-02 | Disposition: A | Discharge status: Home / Self Care | Payer: PRIVATE HEALTH INSURANCE | Source: Ambulatory Visit | Attending: Cardiovascular Disease | Admitting: Cardiovascular Disease

## 2015-12-02 DIAGNOSIS — Z951 Presence of aortocoronary bypass graft: Secondary | ICD-10-CM

## 2015-12-02 DIAGNOSIS — Z9889 Other specified postprocedural states: Secondary | ICD-10-CM

## 2015-12-03 ENCOUNTER — Encounter (HOSPITAL_COMMUNITY): Payer: PRIVATE HEALTH INSURANCE

## 2015-12-04 ENCOUNTER — Encounter (HOSPITAL_COMMUNITY)
Admission: RE | Admit: 2015-12-04 | Discharge: 2015-12-04 | Disposition: A | Discharge status: Home / Self Care | Payer: PRIVATE HEALTH INSURANCE | Source: Ambulatory Visit | Attending: Cardiovascular Disease | Admitting: Cardiovascular Disease

## 2015-12-04 DIAGNOSIS — I872 Venous insufficiency (chronic) (peripheral): Secondary | ICD-10-CM | POA: Diagnosis not present

## 2015-12-04 DIAGNOSIS — Z79899 Other long term (current) drug therapy: Secondary | ICD-10-CM | POA: Insufficient documentation

## 2015-12-04 DIAGNOSIS — Z8673 Personal history of transient ischemic attack (TIA), and cerebral infarction without residual deficits: Secondary | ICD-10-CM | POA: Diagnosis not present

## 2015-12-04 DIAGNOSIS — Z87891 Personal history of nicotine dependence: Secondary | ICD-10-CM | POA: Diagnosis not present

## 2015-12-04 DIAGNOSIS — Z953 Presence of xenogenic heart valve: Secondary | ICD-10-CM | POA: Diagnosis not present

## 2015-12-04 DIAGNOSIS — K219 Gastro-esophageal reflux disease without esophagitis: Secondary | ICD-10-CM | POA: Diagnosis not present

## 2015-12-04 DIAGNOSIS — E1140 Type 2 diabetes mellitus with diabetic neuropathy, unspecified: Secondary | ICD-10-CM | POA: Insufficient documentation

## 2015-12-04 DIAGNOSIS — Z7902 Long term (current) use of antithrombotics/antiplatelets: Secondary | ICD-10-CM | POA: Insufficient documentation

## 2015-12-04 DIAGNOSIS — Z951 Presence of aortocoronary bypass graft: Secondary | ICD-10-CM

## 2015-12-04 DIAGNOSIS — E113599 Type 2 diabetes mellitus with proliferative diabetic retinopathy without macular edema, unspecified eye: Secondary | ICD-10-CM | POA: Insufficient documentation

## 2015-12-04 DIAGNOSIS — I2510 Atherosclerotic heart disease of native coronary artery without angina pectoris: Secondary | ICD-10-CM | POA: Diagnosis not present

## 2015-12-04 DIAGNOSIS — Z794 Long term (current) use of insulin: Secondary | ICD-10-CM | POA: Diagnosis not present

## 2015-12-04 DIAGNOSIS — Z9889 Other specified postprocedural states: Secondary | ICD-10-CM

## 2015-12-04 DIAGNOSIS — Z7982 Long term (current) use of aspirin: Secondary | ICD-10-CM | POA: Insufficient documentation

## 2015-12-04 DIAGNOSIS — E039 Hypothyroidism, unspecified: Secondary | ICD-10-CM | POA: Insufficient documentation

## 2015-12-04 DIAGNOSIS — E7800 Pure hypercholesterolemia, unspecified: Secondary | ICD-10-CM | POA: Diagnosis not present

## 2015-12-04 DIAGNOSIS — Z9641 Presence of insulin pump (external) (internal): Secondary | ICD-10-CM | POA: Insufficient documentation

## 2015-12-04 DIAGNOSIS — G4733 Obstructive sleep apnea (adult) (pediatric): Secondary | ICD-10-CM | POA: Diagnosis not present

## 2015-12-07 ENCOUNTER — Encounter (HOSPITAL_COMMUNITY): Payer: PRIVATE HEALTH INSURANCE

## 2015-12-07 ENCOUNTER — Telehealth (HOSPITAL_COMMUNITY): Payer: Self-pay | Admitting: *Deleted

## 2015-12-08 ENCOUNTER — Encounter: Payer: Self-pay | Admitting: Family Medicine

## 2015-12-08 ENCOUNTER — Encounter (HOSPITAL_COMMUNITY): Payer: PRIVATE HEALTH INSURANCE

## 2015-12-08 ENCOUNTER — Ambulatory Visit (HOSPITAL_BASED_OUTPATIENT_CLINIC_OR_DEPARTMENT_OTHER)
Admission: RE | Admit: 2015-12-08 | Discharge: 2015-12-08 | Disposition: A | Discharge status: Home / Self Care | Payer: PRIVATE HEALTH INSURANCE | Source: Ambulatory Visit | Attending: Family Medicine | Admitting: Family Medicine

## 2015-12-08 ENCOUNTER — Telehealth: Payer: Self-pay | Admitting: Family Medicine

## 2015-12-08 ENCOUNTER — Ambulatory Visit (INDEPENDENT_AMBULATORY_CARE_PROVIDER_SITE_OTHER): Payer: PRIVATE HEALTH INSURANCE | Admitting: Family Medicine

## 2015-12-08 VITALS — BP 101/66 | HR 99 | Temp 98.7°F | Resp 20 | Wt 217.5 lb

## 2015-12-08 DIAGNOSIS — R05 Cough: Secondary | ICD-10-CM | POA: Insufficient documentation

## 2015-12-08 DIAGNOSIS — Z952 Presence of prosthetic heart valve: Secondary | ICD-10-CM | POA: Insufficient documentation

## 2015-12-08 DIAGNOSIS — R059 Cough, unspecified: Secondary | ICD-10-CM

## 2015-12-08 DIAGNOSIS — J209 Acute bronchitis, unspecified: Secondary | ICD-10-CM

## 2015-12-08 DIAGNOSIS — Z951 Presence of aortocoronary bypass graft: Secondary | ICD-10-CM | POA: Diagnosis not present

## 2015-12-08 DIAGNOSIS — I700 Atherosclerosis of aorta: Secondary | ICD-10-CM | POA: Diagnosis not present

## 2015-12-08 MED ORDER — PREDNISONE 50 MG PO TABS: ORAL_TABLET | ORAL | 0 refills | Status: DC

## 2015-12-08 NOTE — Telephone Encounter (Signed)
Please call pt: - his cxr did not show evidence of pneumonia. Treatment as discussed yesterday at appt, follow up in 1 week if not improving or sooner if worsening.

## 2015-12-08 NOTE — Progress Notes (Signed)
[name] , 25-Aug-1950, 65 y.o., male MRN: [id number] Patient Care Team    Relationship Specialty Notifications Start End  [name], MD PCP - General Family Medicine  01/12/11   Runell Gess, MD Consulting Physician Cardiology  10/19/11   Carlus Pavlov, MD Consulting Physician Endocrinology  06/03/12   Barbaraann Share, MD Consulting Physician Orthopedic Surgery  03/31/15    Comment: [facility] in [place]  Runell Gess, MD Consulting Physician Cardiology  07/16/15   Loreli Slot, MD Consulting Physician Cardiothoracic Surgery  08/06/15     CC: cough Subjective: Pt presents for an acute OV with complaints of cough  of 4 days duration.  Associated symptoms include dry cough. Fatigue, hoarseness. He reports a co-worker is also ill. He has tried mucinex and zyrtec without relief. He does endorse mild wheezing. He had bronchitis this summer after a heart surgery that responded to prednisone. He is an IDDM, and reports his sugars have not been elevated. He is eating and drinking well. He denies nausea, vomit, diarrhea, headache or sinus pressure.    Allergies  Allergen Reactions  . Rosuvastatin Calcium Other (See Comments)    Problem with higher dosages (Lipitor caused memory issues), also caused leg pains and lethargy  . Statins Other (See Comments)    Problem with higher dosages (Lipitor caused memory issues), also caused leg pains and lethargy  . Tape Rash and Other (See Comments)    Adhesive tape.  (Paper tape OK)   Social History  Substance Use Topics  . Smoking status: Former Games developer  . Smokeless tobacco: Former Neurosurgeon    Quit date: 06/22/1987  . Alcohol use 0.0 oz/week     Comment: occasional   Past Medical History:  Diagnosis Date  . Abnormal stress test 10/27/2011   lat isch--cardiac cath 10/28/2011  . Aortic stenosis, moderate Sept/Oct /2013   a. Mild/mod by echo; mild by cath 10/28/11; b. Mild progression by echo 05/2012; c. Further mild progression  on 01/2014 echo (valve area 0.93 cm2).  d. Further progression on echo 01/2015; e. 07/2015 s/p [facility] Ease bovine pericardial valve model 3300 TFX, ser # [id number].  . 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 Inferolateral perfusion defect + EKG abnormality during stress testing prompted Cath by [facility]: 3V CAD, EF normal.  Med mgmt recommended; b. 06/2015 Cath: 3VD and mod AS; c. 07/2015 CABG x 3 (LIMA->LAD, VG->OM2, VG->PDA) w/ AVR.  [name] Carotid stenosis, right 09/2011   Dr. [name] did R carotid stenting 01/2014.  Carotid dopplers 06/2015 essentially normal.  . Complication of anesthesia    difficulty with intubation,   . Decreased pedal pulses    LE doppler 11/08/11- no evidence of arterial insufficiency  . Diabetes mellitus Dx'd age 89   Novolog via insulin pump; sub-optimal control x years  . Diabetic neuropathy (HCC)    fine touch and position sense affected  . Diabetic retinopathy    Proliferative: Hx of retinal detachment on right-light perception only in right eye  . Diastolic dysfunction 2007; 2016   TEE with diastolic dysfunction, EF 74%.  . Difficult intubation    ~ 2002 difficult fiberoptic intubation with takeback bleeding s/p thyroidectomy;  for thyroidectomy was intubated DL X 1 with cricoid pressure but difficult mask (full beard)  . Dizziness    feels off balance  . Erectile dysfunction    Se[place] urologist in [place]  . GERD (gastroesophageal reflux disease)   . High cholesterol  a. intolerant of statins/zeta.  . History of tobacco abuse    Quit about 1990 (has 35 pack-yr hx)  . Hypothyroidism, postsurgical    Thyroidectomy 2002; multinodular goiter.  Dr. Elvera LennoxGherghe managing this as of 10/2015.  . Impaired vision    right eye light perception only; hx of retinal detachment.  . Impingement syndrome of right shoulder    08/26/15 Pt got subacromial steroid injection by orthopedist (Dr. Delfin EdisJohn Ritchie).  . Neuromuscular disorder (HCC)    neuropathy  . OSA (obstructive sleep apnea)     06/2011 sleep study: moderate OSA, CPAP at 12 cm H2O.  . Osteoarthritis    Bilat thumb carpometacarpal joints.  Ortho injected each thumb x 2 in 2017.  [name]. Recurrent pneumonia   . Shortness of breath dyspnea   . Stroke [facility](HCC)    TIA  . TIA (transient ischemic attack) 2015/16   with R carotid dz: pt got R carotid stent 01/2014 by Dr. Allyson SabalBerry and was put on dual antiplatelet therapy with ASA 325mg  and plavix 75mg  qd afterwards  . TIA (transient ischemic attack) 05/2015   Left brain (right sided numbness + slurred speech)  Admitted for obs/workup 05/2015.  [name]. Venous insufficiency   . Venous reflux 10/14/2011   venous doppler-R GSV continuous reflux throughout; too small for VNUS closure   Past Surgical History:  Procedure Laterality Date  . AORTIC VALVE REPLACEMENT N/A 07/09/2015   Bovine pericardial valve.  Procedure: AORTIC VALVE REPLACEMENT (AVR);  Surgeon: Loreli SlotSteven C Hendrickson, MD;  [facility];  Service: Open Heart Surgery;  Laterality: N/A;  . CARDIAC CATHETERIZATION  10/2011   EF normal.  Diffuse 3 vessel CAD, no stents placed.  Medical mgmt per [facility].  [name]. CARDIAC CATHETERIZATION N/A 06/22/2015   Procedure: Right/Left Heart Cath and Coronary Angiography;  Surgeon: Runell GessJonathan J Berry, MD;  [facility];  Service: Cardiovascular;  Laterality: N/A;  . carotid dopplers  06/30/15   Normal: repeat when clinically indicated per Dr. Allyson SabalBerry  . CAROTID STENT INSERTION Right 01/16/2014   Procedure: CAROTID STENT INSERTION;  Surgeon: Runell GessJonathan J Berry, MD;  [facility];  Service: Cardiovascular;  Laterality: Right;  . CATARACT EXTRACTION     left  . CORONARY ARTERY BYPASS GRAFT N/A 07/09/2015   Procedure: CORONARY ARTERY BYPASS GRAFTING (CABG)TIMES THREE USING LEFT INTERNAL MAMMARY ARTERY AND LEFT SAPHENOUS LEG VEIN HARVESTED ENDOSCOPICALLY;  Surgeon: Loreli SlotSteven C Hendrickson, MD;  [facility];  Service: Open Heart Surgery;  Laterality: N/A;  . EYE SURGERY     Multiple laser  surgeries for diabetic retinopathy, also cataract surgery OU.  [name]. INTRAOPERATIVE TRANSESOPHAGEAL ECHOCARDIOGRAM N/A 07/09/2015   Procedure: INTRAOPERATIVE TRANSESOPHAGEAL ECHOCARDIOGRAM;  Surgeon: Loreli SlotSteven C Hendrickson, MD;  [facility];  Service: Open Heart Surgery;  Laterality: N/A;  . lasix eye    . LEFT AND RIGHT HEART CATHETERIZATION WITH CORONARY ANGIOGRAM N/A 10/28/2011   Procedure: LEFT AND R0IHT HEART CATHETERIZATION WITH CORONARY ANGIOGRAM;  Surgeon: Lennette Biharihomas A Kelly, MD;  [facility];  Service: Cardiovascular;  Laterality: N/A;  . RETINAL DETACHMENT SURGERY     Right eye  . THYROID SURGERY  2002  . TRANSTHORACIC ECHOCARDIOGRAM  10/2011;12/2012;01/2015; 08/2015   Mod AS, mild LVH, EF 60-65%, no RWMA.  01/2015 EF 60-65%, grade I DD, progression of mod/sev AS.  08/2015 normal lv fxn with normal fxning bioprosthetic Ao valve.   Family History  Problem Relation Age of Onset  . Cancer Mother  lung cancer  . Alcohol abuse Father   . Cancer Father     laryngeal cancer  . Cancer Brother     oldest brother had lung cancer and melanoma     Medication List       Accurate as of 12/08/15  3:44 PM. Always use your most recent med list.          aspirin EC 81 MG tablet Take 81 mg by mouth daily.   clopidogrel 75 MG tablet Commonly known as:  PLAVIX Take 1 tablet (75 mg total) by mouth daily.   clotrimazole-betamethasone lotion Commonly known as:  LOTRISONE Apply 1 application topically daily as needed (rash).   FEVERFEW PO Take 1 capsule by mouth daily. To prevent migraines   insulin pump Soln Inject into the skin continuous. Basal rate .95/hr, current pump settings: 12am .7, 2am 1, 7am 1.1, 11am .9, 5pm .9.25, 11pm .85. Uses Novolog Insulin.   levothyroxine 137 MCG tablet Commonly known as:  SYNTHROID, LEVOTHROID Take 1 tablet (137 mcg total) by mouth daily.   ONE-A-DAY MENS 50+ ADVANTAGE Tabs Take 1 tablet by mouth daily with breakfast.   ranitidine 150 MG  tablet Commonly known as:  ZANTAC Take 150 mg by mouth at bedtime as needed for heartburn.   tadalafil 20 MG tablet Commonly known as:  CIALIS Take 1 tablet (20 mg total) by mouth daily as needed for erectile dysfunction.       No results found for this or any previous visit (from the past 24 hour(s)). No results found.   ROS: Negative, with the exception of above mentioned in HPI   Objective:  BP 101/66 (BP Location: Left Arm, Patient Position: Sitting, Cuff Size: Large)   Pulse 99   Temp 98.7 F (37.1 C)   Resp 20   Wt 217 lb 8 oz (98.7 kg)   SpO2 97%   BMI 28.70 kg/m  Body mass index is 28.7 kg/m. Gen: Afebrile. No acute distress. Nontoxic in appearance, well developed, well nourished. Appears fatigued.  HENT: AT. [place]. Bilateral TM visualized wnl. MMM, no oral lesions. Bilateral nares with erythema, mild swelling. Throat without erythema or exudates. Cough and Hoarseness present. No TTP sinus.  Eyes:Pupils Equal Round Reactive to light, Extraocular movements intact,  Conjunctiva without redness, discharge or icterus. Neck/lymp/endocrine: Supple,no lymphadenopathy CV: RRR Chest: cough with deep inspiration. LLL crackle. No wheezing appreciate. Rhonchi present.  Abd: Soft. NTND. BS present Neuro: Normal gait. PERLA. EOMi. Alert. Oriented x3   Assessment/Plan: Nathaniel PepperRichard L Carter is a 65 y.o. male present for acute OV for  Cough Acute bronchitis, unspecified organism - DG Chest 2 View; Future - Flonase, mucinex DM, CXR, Prednisone - If cxr is abnormal doxycyline will be prescribed.  - F/U 1 week sooner if worsening.   electronically signed by:  Felix Pacinienee Kuneff, DO  [facility] - OR

## 2015-12-08 NOTE — Patient Instructions (Signed)
Start prednisone today, with food.  [facility].  If it appears infectious we will start an antibiotic as well.  Rest and hydrate.  Take mucinex DM and use flonase nasal spray.   Acute Bronchitis, Adult Acute bronchitis is when air tubes (bronchi) in the lungs suddenly get swollen. The condition can make it hard to breathe. It can also cause these symptoms:  A cough.  Coughing up clear, yellow, or green mucus.  Wheezing.  Chest congestion.  Shortness of breath.  A fever.  Body aches.  Chills.  A sore throat. Follow these instructions at home: Medicines  Take over-the-counter and prescription medicines only as told by your doctor.  If you were prescribed an antibiotic medicine, take it as told by your doctor. Do not stop taking the antibiotic even if you start to feel better. General instructions  Rest.  Drink enough fluids to keep your pee (urine) clear or pale yellow.  Avoid smoking and secondhand smoke. If you smoke and you need help quitting, ask your doctor. Quitting will help your lungs heal faster.  Use an inhaler, cool mist vaporizer, or humidifier as told by your doctor.  Keep all follow-up visits as told by your doctor. This is important. How is this prevented? To lower your risk of getting this condition again:  Wash your hands often with soap and water. If you cannot use soap and water, use hand sanitizer.  Avoid contact with people who have cold symptoms.  Try not to touch your hands to your mouth, nose, or eyes.  Make sure to get the flu shot every year. Contact a doctor if:  Your symptoms do not get better in 2 weeks. Get help right away if:  You cough up blood.  You have chest pain.  You have very bad shortness of breath.  You become dehydrated.  You faint (pass out) or keep feeling like you are going to pass out.  You keep throwing up (vomiting).  You have a very bad headache.  Your  fever or chills gets worse. This information is not intended to replace advice given to you by your health care provider. Make sure you discuss any questions you have with your health care provider. Document Released: 06/08/2007 Document Revised: 07/29/2015 Document Reviewed: 06/10/2015 Elsevier Interactive Patient Education  2017 [employer]

## 2015-12-09 ENCOUNTER — Encounter (HOSPITAL_COMMUNITY): Payer: PRIVATE HEALTH INSURANCE

## 2015-12-09 MED ORDER — DEXTROMETHORPHAN HBR 15 MG/5ML PO SYRP: 10.0000 mL | ORAL_SOLUTION | Freq: Four times a day (QID) | ORAL | 0 refills | Status: DC | PRN

## 2015-12-09 MED ORDER — BENZONATATE 200 MG PO CAPS: 200.0000 mg | ORAL_CAPSULE | Freq: Three times a day (TID) | ORAL | 0 refills | Status: DC | PRN

## 2015-12-09 NOTE — Telephone Encounter (Signed)
Please call pt: - he is asking for a cough medicine to be prescribed for him, for night time cough with his cold.  - Please clarify with the pt the type of cough medicine he is requesting. Any medicine with codeine will need to be printed and picked up, if that is what he is desiring (it is what he had with a previous cold). If he is not desiring that type of cough medicine, I can call that in for him.

## 2015-12-09 NOTE — Telephone Encounter (Signed)
Spoke with patient he is currently taking Mucinex DM but this has not been effective he is willing to try something you call in since he does need coverage for daytime and nighttime. Please advise.

## 2015-12-09 NOTE — Telephone Encounter (Signed)
Spoke with patient reviewed information and instructions. Patient verbalized understanding. 

## 2015-12-09 NOTE — Telephone Encounter (Signed)
Please call pt: - I have called in a cough syrup, it is like the medicine in his DM portion of mucinex, so have him switch to plain mucinex if still have drainage.  - I also called tessalon perles, both medicines act differently, so he may find one works better than other. He can take together.

## 2015-12-09 NOTE — Telephone Encounter (Signed)
Patient is [facility] cough [facility] He was up all night.  I also read Dr. Alan RipperKuneff's message to the patient.

## 2015-12-10 ENCOUNTER — Encounter (HOSPITAL_COMMUNITY): Payer: Self-pay | Admitting: *Deleted

## 2015-12-10 ENCOUNTER — Encounter (HOSPITAL_COMMUNITY): Payer: PRIVATE HEALTH INSURANCE

## 2015-12-10 NOTE — Progress Notes (Signed)
Cardiac Individual Treatment Plan  Patient Details  Name: [name] MRN: [id number] Date of Birth: 1950/02/11 Referring Provider:   Flowsheet Row CARDIAC REHAB PHASE II ORIENTATION from 10/01/2015 in [facility] CARDIAC REHAB  Referring Provider  Andree Coss, MD      Initial Encounter Date:  Flowsheet Row CARDIAC REHAB PHASE II ORIENTATION from 10/01/2015 in [facility] CARDIAC REHAB  Date  10/01/15  Referring Provider  Andree Coss, MD      Visit Diagnosis: No diagnosis found.  Patient's Home Medications on Admission:  Current Outpatient Prescriptions:  .  aspirin EC 81 MG tablet, Take 81 mg by mouth daily., Disp: , Rfl:  .  benzonatate (TESSALON) 200 MG capsule, Take 1 capsule (200 mg total) by mouth 3 (three) times daily as needed for cough., Disp: 30 capsule, Rfl: 0 .  clopidogrel (PLAVIX) 75 MG tablet, Take 1 tablet (75 mg total) by mouth daily., Disp: 90 tablet, Rfl: 3 .  clotrimazole-betamethasone (LOTRISONE) lotion, Apply 1 application topically daily as needed (rash)., Disp: , Rfl:  .  dextromethorphan 15 MG/5ML syrup, Take 10 mLs (30 mg total) by mouth 4 (four) times daily as needed for cough., Disp: 120 mL, Rfl: 0 .  FEVERFEW PO, Take 1 capsule by mouth daily. To prevent migraines, Disp: , Rfl:  .  Insulin Human (INSULIN PUMP) SOLN, Inject into the skin continuous. Basal rate .95/hr, current pump settings: 12am .7, 2am 1, 7am 1.1, 11am .9, 5pm .9.25, 11pm .85. Uses Novolog Insulin., Disp: , Rfl:  .  levothyroxine (SYNTHROID, LEVOTHROID) 137 MCG tablet, Take 1 tablet (137 mcg total) by mouth daily., Disp: 90 tablet, Rfl: 1 .  Multiple Vitamins-Minerals (ONE-A-DAY MENS 50+ ADVANTAGE) TABS, Take 1 tablet by mouth daily with breakfast., Disp: , Rfl:  .  predniSONE (DELTASONE) 50 MG tablet, 1 tab daily with food., Disp: 5 tablet, Rfl: 0 .  ranitidine (ZANTAC) 150 MG tablet, Take 150 mg by mouth at bedtime as needed for heartburn. ,  Disp: , Rfl:  .  tadalafil (CIALIS) 20 MG tablet, Take 1 tablet (20 mg total) by mouth daily as needed for erectile dysfunction., Disp: 15 tablet, Rfl: 6  Past Medical History: Past Medical History:  Diagnosis Date  . Abnormal stress test 10/27/2011   lat isch--cardiac cath 10/28/2011  . Aortic stenosis, moderate Sept/Oct /2013   a. Mild/mod by echo; mild by cath 10/28/11; b. Mild progression by echo 05/2012; c. Further mild progression on 01/2014 echo (valve area 0.93 cm2).  d. Further progression on echo 01/2015; e. 07/2015 s/p [facility] Ease bovine pericardial valve model 3300 TFX, ser # [id number].  . 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 Inferolateral perfusion defect + EKG abnormality during stress testing prompted Cath by [facility]: 3V CAD, EF normal.  Med mgmt recommended; b. 06/2015 Cath: 3VD and mod AS; c. 07/2015 CABG x 3 (LIMA->LAD, VG->OM2, VG->PDA) w/ AVR.  [name] Carotid stenosis, right 09/2011   Dr. [name] did R carotid stenting 01/2014.  Carotid dopplers 06/2015 essentially normal.  . Complication of anesthesia    difficulty with intubation,   . Decreased pedal pulses    LE doppler 11/08/11- no evidence of arterial insufficiency  . Diabetes mellitus Dx'd age 69   Novolog via insulin pump; sub-optimal control x years  . Diabetic neuropathy (HCC)    fine touch and position sense affected  . Diabetic retinopathy    Proliferative: Hx of retinal detachment on right-light perception only in right  eye  . Diastolic dysfunction 2007; 2016   TEE with diastolic dysfunction, EF 74%.  . Difficult intubation    ~ 2002 difficult fiberoptic intubation with takeback bleeding s/p thyroidectomy;  for thyroidectomy was intubated DL X 1 with cricoid pressure but difficult mask (full beard)  . Dizziness    feels off balance  . Erectile dysfunction    Se[place] urologist in [place]  . GERD (gastroesophageal reflux disease)   . High cholesterol    a. intolerant of statins/zeta.  . History of tobacco abuse    Quit  about 1990 (has 35 pack-yr hx)  . Hypothyroidism, postsurgical    Thyroidectomy 2002; multinodular goiter.  Dr. Elvera Lennox managing this as of 10/2015.  . Impaired vision    right eye light perception only; hx of retinal detachment.  . Impingement syndrome of right shoulder    08/26/15 Pt got subacromial steroid injection by orthopedist (Dr. Delfin Edis).  . Neuromuscular disorder (HCC)    neuropathy  . OSA (obstructive sleep apnea)    06/2011 sleep study: moderate OSA, CPAP at 12 cm H2O.  . Osteoarthritis    Bilat thumb carpometacarpal joints.  Ortho injected each thumb x 2 in 2017.  [name] Recurrent pneumonia   . Shortness of breath dyspnea   . Stroke [facility])    TIA  . TIA (transient ischemic attack) 2015/16   with R carotid dz: pt got R carotid stent 01/2014 by Dr. [name] and was put on dual antiplatelet therapy with ASA 325mg  and plavix 75mg  qd afterwards  . TIA (transient ischemic attack) 05/2015   Left brain (right sided numbness + slurred speech)  Admitted for obs/workup 05/2015.  [name] Venous insufficiency   . Venous reflux 10/14/2011   venous doppler-R GSV continuous reflux throughout; too small for VNUS closure    Tobacco Use: History  Smoking Status  . Former Smoker  Smokeless Tobacco  . Former Neurosurgeon  . Quit date: 06/22/1987    Labs: Recent Review Flowsheet Data    Labs for ITP Cardiac and Pulmonary Rehab Latest Ref Rng & Units 07/09/2015 07/09/2015 07/10/2015 07/20/2015 09/11/2015   Cholestrol 0 - 200 mg/dL - - - - -   LDLCALC 0 - 99 mg/dL - - - - -   LDLDIRECT mg/dL - - - - -   HDL >16 mg/dL - - - - -   Trlycerides <150 mg/dL - - - - -   Hemoglobin A1c - - - - - 8.0   PHART 7.350 - 7.450 7.352 - - - -   PCO2ART 35.0 - 45.0 mmHg 44.3 - - - -   HCO3 20.0 - 24.0 mEq/L 24.6(H) - - - -   TCO2 0 - 100 mmol/L 26 24 26 29  -   ACIDBASEDEF 0.0 - 2.0 mmol/L 1.0 - - - -   O2SAT % 96.0 - - - -      Capillary Blood Glucose: Lab Results  Component Value Date   GLUCAP 258 (H) 11/11/2015    GLUCAP 150 (H) 10/12/2015   GLUCAP 227 (H) 10/09/2015   GLUCAP 261 (H) 10/09/2015   GLUCAP 259 (H) 10/07/2015     Exercise Target Goals:    Exercise Program Goal: Individual exercise prescription set with THRR, safety & activity barriers. Participant demonstrates ability to understand and report RPE using BORG scale, to self-measure pulse accurately, and to acknowledge the importance of the exercise prescription.  Exercise Prescription Goal: Starting with aerobic activity 30 plus minutes a day, 3 days per  week for initial exercise prescription. Provide home exercise prescription and guidelines that participant acknowledges understanding prior to discharge.  Activity Barriers & Risk Stratification:   6 Minute Walk:   Initial Exercise Prescription:   Perform Capillary Blood Glucose checks as needed.  Exercise Prescription Changes:     Exercise Prescription Changes    Row Name 10/16/15 0700 10/30/15 1600 11/30/15 1100         Exercise Review   Progression Yes Yes Yes       Response to Exercise   Blood Pressure (Admit) 118/60 120/62 134/68     Blood Pressure (Exercise) 130/68 148/56 142/58     Blood Pressure (Exit) 138/78 128/62 100/62     Heart Rate (Admit) 95 bpm 98 bpm 95 bpm     Heart Rate (Exercise) 124 bpm 136 bpm 136 bpm     Heart Rate (Exit) 94 bpm 102 bpm 148 bpm     Oxygen Saturation (Admit)  -  - 101 %     Rating of Perceived Exertion (Exercise) 12 13 13      Comments Reviewed HEP on 10/16/15 Reviewed HEP on 10/16/15 Reviewed HEP on 10/16/15     Duration Progress to 30 minutes of continuous aerobic without signs/symptoms of physical distress Progress to 30 minutes of continuous aerobic without signs/symptoms of physical distress Progress to 30 minutes of continuous aerobic without signs/symptoms of physical distress     Intensity THRR unchanged THRR unchanged THRR unchanged       Progression   Progression Continue to progress workloads to maintain intensity  without signs/symptoms of physical distress. Continue to progress workloads to maintain intensity without signs/symptoms of physical distress. Continue to progress workloads to maintain intensity without signs/symptoms of physical distress.     Average METs 2.8 3.26 3.5       Resistance Training   Training Prescription Yes Yes Yes     Weight 2lbs 5lbs 8lbs     Reps 10-12 10-12 10-12       Interval Training   Interval Training No No No       Bike   Level 1 1 1      Minutes 10 10 10      METs 2.52 2.52 2.96       NuStep   Level 3 3 4      Minutes 10 10 10      METs 2.5 2.5 4       Track   Laps 12 12 14      Minutes 10 10 10      METs 3.09 3.09 3.43       Home Exercise Plan   Plans to continue exercise at Home  Reviewed on 10/16/15. See progress note Home  Reviewed on 10/16/15. See progress note Home  Reviewed on 10/16/15. See progress note     Frequency Add 2 additional days to program exercise sessions. Add 2 additional days to program exercise sessions. Add 2 additional days to program exercise sessions.        Exercise Comments:     Exercise Comments    Row Name 10/16/15 0807           Exercise Comments Reviewed METs and goals. Pt is tolerating exercise well; will continue to monitor exercise progression.          Discharge Exercise Prescription (Final Exercise Prescription Changes):     Exercise Prescription Changes - 11/30/15 1100      Exercise Review   Progression Yes     Response  to Exercise   Blood Pressure (Admit) 134/68   Blood Pressure (Exercise) 142/58   Blood Pressure (Exit) 100/62   Heart Rate (Admit) 95 bpm   Heart Rate (Exercise) 136 bpm   Heart Rate (Exit) 148 bpm   Oxygen Saturation (Admit) 101 %   Rating of Perceived Exertion (Exercise) 13   Comments Reviewed HEP on 10/16/15   Duration Progress to 30 minutes of continuous aerobic without signs/symptoms of physical distress   Intensity THRR unchanged     Progression   Progression  Continue to progress workloads to maintain intensity without signs/symptoms of physical distress.   Average METs 3.5     Resistance Training   Training Prescription Yes   Weight 8lbs   Reps 10-12     Interval Training   Interval Training No     Bike   Level 1   Minutes 10   METs 2.96     NuStep   Level 4   Minutes 10   METs 4     Track   Laps 14   Minutes 10   METs 3.43     Home Exercise Plan   Plans to continue exercise at Home  Reviewed on 10/16/15. See progress note   Frequency Add 2 additional days to program exercise sessions.      Nutrition:  Target Goals: Understanding of nutrition guidelines, daily intake of sodium 1500mg , cholesterol 200mg , calories 30% from fat and 7% or less from saturated fats, daily to have 5 or more servings of fruits and vegetables.  Biometrics:    Nutrition Therapy Plan and Nutrition Goals:   Nutrition Discharge: Nutrition Scores:   Nutrition Goals Re-Evaluation:   Psychosocial: Target Goals: Acknowledge presence or absence of depression, maximize coping skills, provide positive support system. Participant is able to verbalize types and ability to use techniques and skills needed for reducing stress and depression.  Initial Review & Psychosocial Screening:   Quality of Life Scores:   PHQ-9: Recent Review Flowsheet Data    [facility] 10/05/2015 10/09/2014   Decreased Interest 0 0   Down, Depressed, Hopeless 0 0   PHQ - 2 Score 0 0      Psychosocial Evaluation and Intervention:     Psychosocial Evaluation - 12/10/15 1303      Psychosocial Evaluation & Interventions   Comments Pt has supportive family and demonstrates positve and healthy outlook on his recovery from his cardiac event.     Discharge Psychosocial Assessment & Intervention   Discharge Continue support measures as needed      Psychosocial Re-Evaluation:     Psychosocial Re-Evaluation    Row Name 11/12/15 1211 12/10/15 1303            Psychosocial Re-Evaluation   Interventions Encouraged to attend Cardiac Rehabilitation for the exercise Encouraged to attend Cardiac Rehabilitation for the exercise      Continued Psychosocial Services Needed  - No         Vocational Rehabilitation: Provide vocational rehab assistance to qualifying candidates.   Vocational Rehab Evaluation & Intervention:   Education: Education Goals: Education classes will be provided on a weekly basis, covering required topics. Participant will state understanding/return demonstration of topics presented.  Learning Barriers/Preferences:   Education Topics: Count Your Pulse:  -Group instruction provided by verbal instruction, demonstration, patient participation and written materials to support subject.  Instructors address importance of being able to find your pulse and how to count your pulse when at home without a  heart monitor.  Patients get hands on experience counting their pulse with staff help and individually.   Heart Attack, Angina, and Risk Factor Modification:  -Group instruction provided by verbal instruction, video, and written materials to support subject.  Instructors address signs and symptoms of angina and heart attacks.    Also discuss risk factors for heart disease and how to make changes to improve heart health risk factors.   Functional Fitness:  -Group instruction provided by verbal instruction, demonstration, patient participation, and written materials to support subject.  Instructors address safety measures for doing things around the house.  Discuss how to get up and down off the floor, how to pick things up properly, how to safely get out of a chair without assistance, and balance training.   Meditation and Mindfulness:  -Group instruction provided by verbal instruction, patient participation, and written materials to support subject.  Instructor addresses importance of mindfulness and meditation practice to  help reduce stress and improve awareness.  Instructor also leads participants through a meditation exercise.    Stretching for Flexibility and Mobility:  -Group instruction provided by verbal instruction, patient participation, and written materials to support subject.  Instructors lead participants through series of stretches that are designed to increase flexibility thus improving mobility.  These stretches are additional exercise for major muscle groups that are typically performed during regular warm up and cool down.   Hands Only CPR Anytime:  -Group instruction provided by verbal instruction, video, patient participation and written materials to support subject.  Instructors co-teach with AHA video for hands only CPR.  Participants get hands on experience with mannequins.   Nutrition I class: Heart Healthy Eating:  -Group instruction provided by PowerPoint slides, verbal discussion, and written materials to support subject matter. The instructor gives an explanation and review of the Therapeutic Lifestyle Changes diet recommendations, which includes a discussion on lipid goals, dietary fat, sodium, fiber, plant stanol/sterol esters, sugar, and the components of a well-balanced, healthy diet. Flowsheet Row CARDIAC REHAB PHASE II EXERCISE from 10/30/2015 in [facility] CARDIAC REHAB  Date  10/30/15  Educator  RD  Instruction Review Code  Not applicable [class handouts given]      Nutrition II class: Lifestyle Skills:  -Group instruction provided by PowerPoint slides, verbal discussion, and written materials to support subject matter. The instructor gives an explanation and review of label reading, grocery shopping for heart health, heart healthy recipe modifications, and ways to make healthier choices when eating out. Flowsheet Row CARDIAC REHAB PHASE II EXERCISE from 10/30/2015 in [facility] CARDIAC REHAB  Date  10/30/15  Educator  RD  Instruction  Review Code  Not applicable [class handouts given]      Diabetes Question & Answer:  -Group instruction provided by PowerPoint slides, verbal discussion, and written materials to support subject matter. The instructor gives an explanation and review of diabetes co-morbidities, pre- and post-prandial blood glucose goals, pre-exercise blood glucose goals, signs, symptoms, and treatment of hypoglycemia and hyperglycemia, and foot care basics.   Diabetes Blitz:  -Group instruction provided by PowerPoint slides, verbal discussion, and written materials to support subject matter. The instructor gives an explanation and review of the physiology behind type 1 and type 2 diabetes, diabetes medications and rational behind using different medications, pre- and post-prandial blood glucose recommendations and Hemoglobin A1c goals, diabetes diet, and exercise including blood glucose guidelines for exercising safely.  Flowsheet Row CARDIAC REHAB PHASE II EXERCISE from 10/30/2015 in [facility] CARDIAC REHAB  Date  10/30/15  Educator  RD  Instruction Review Code  Not applicable [class handouts given]      Portion Distortion:  -Group instruction provided by PowerPoint slides, verbal discussion, written materials, and food models to support subject matter. The instructor gives an explanation of serving size versus portion size, changes in portions sizes over the last 20 years, and what consists of a serving from each food group. Flowsheet Row CARDIAC REHAB PHASE II EXERCISE from 10/30/2015 in [facility] CARDIAC REHAB  Date  10/07/15  Educator  RD  Instruction Review Code  2- meets goals/outcomes      Stress Management:  -Group instruction provided by verbal instruction, video, and written materials to support subject matter.  Instructors review role of stress in heart disease and how to cope with stress positively.     Exercising on Your Own:  -Group instruction  provided by verbal instruction, power point, and written materials to support subject.  Instructors discuss benefits of exercise, components of exercise, frequency and intensity of exercise, and end points for exercise.  Also discuss use of nitroglycerin and activating EMS.  Review options of places to exercise outside of rehab.  Review guidelines for sex with heart disease.   Cardiac Drugs I:  -Group instruction provided by verbal instruction and written materials to support subject.  Instructor reviews cardiac drug classes: antiplatelets, anticoagulants, beta blockers, and statins.  Instructor discusses reasons, side effects, and lifestyle considerations for each drug class.   Cardiac Drugs II:  -Group instruction provided by verbal instruction and written materials to support subject.  Instructor reviews cardiac drug classes: angiotensin converting enzyme inhibitors (ACE-I), angiotensin II receptor blockers (ARBs), nitrates, and calcium channel blockers.  Instructor discusses reasons, side effects, and lifestyle considerations for each drug class.   Anatomy and Physiology of the Circulatory System:  -Group instruction provided by verbal instruction, video, and written materials to support subject.  Reviews functional anatomy of heart, how it relates to various diagnoses, and what role the heart plays in the overall system.   Knowledge Questionnaire Score:   Core Components/Risk Factors/Patient Goals at Admission:   Core Components/Risk Factors/Patient Goals Review:      Goals and Risk Factor Review    Row Name 10/16/15 0819 10/30/15 1650           Core Components/Risk Factors/Patient Goals Review   Personal Goals Review Improve shortness of breath with ADL's Other;Improve shortness of breath with ADL's      Review Pt is experiencing less SOB with activities and experiencing less vertigo symptoms Pt stated that SOB has improved and is able to tolerate exercise very well in cardiac  rehab      Expected Outcomes Pt will continue to do exercises and perform activities with less SOB Pt will continue to increase aerobic capacity and challenge CV system; will request a THR increase from cardiologist         Core Components/Risk Factors/Patient Goals at Discharge (Final Review):      Goals and Risk Factor Review - 10/30/15 1650      Core Components/Risk Factors/Patient Goals Review   Personal Goals Review Other;Improve shortness of breath with ADL's   Review Pt stated that SOB has improved and is able to tolerate exercise very well in cardiac rehab   Expected Outcomes Pt will continue to increase aerobic capacity and challenge CV system; will request a THR increase from cardiologist      ITP Comments:  Comments:  Pt is making expected progress toward personal goals after completing 25 sessions.  Pt out this week for a cold.  Otherwise pt is doing well with his exercise and diabetes management. .Repeat Psychosocial Assessment: Pt with supportive family, denies any Psychosocial needs or interventions at this time. Recommend continued exercise and life style modification education including  stress management and relaxation techniques to decrease cardiac risk profile. Alanson Aly, BSN

## 2015-12-11 ENCOUNTER — Telehealth: Payer: Self-pay | Admitting: Family Medicine

## 2015-12-11 MED ORDER — HYDROCODONE-HOMATROPINE 5-1.5 MG/5ML PO SYRP: 5.0000 mL | ORAL_SOLUTION | Freq: Three times a day (TID) | ORAL | 0 refills | Status: DC | PRN

## 2015-12-11 NOTE — Telephone Encounter (Signed)
Patient walked into the office stating that he is waiting on his RX for cough medicine because he is not going home and then coming back and he doesn't want to go home without it.  I have advised Rosalita ChessmanSuzanne and she is going to talk with Dr Claiborne BillingsKuneff.  I advised pt to have a seat in the lobby.

## 2015-12-11 NOTE — Telephone Encounter (Signed)
Patient called stating that he left a message and had not heard back and he is wanting cough medication.  He states that it was offered to him at his appointment but he declined but he states that he has left work today and had no sleep so he would appreciate it being printed so he can pick it up before the end of the day today.  Please advise.

## 2015-12-11 NOTE — Telephone Encounter (Signed)
Hycodan prescription provided to patient. He needs to be reminded of refill request and callbacks cannot be expected to always be completed on the same day when non-urgent, let alone in a few hours. If he requires any further medications he will need to follow-up.

## 2015-12-16 ENCOUNTER — Telehealth (HOSPITAL_COMMUNITY): Payer: Self-pay | Admitting: Family Medicine

## 2015-12-16 NOTE — Telephone Encounter (Signed)
Pt cancelled due to still having Bronchitits, RN will put in more visits for pt

## 2015-12-18 ENCOUNTER — Encounter (HOSPITAL_COMMUNITY)
Admission: RE | Admit: 2015-12-18 | Discharge: 2015-12-18 | Disposition: A | Discharge status: Home / Self Care | Payer: PRIVATE HEALTH INSURANCE | Source: Ambulatory Visit | Attending: Cardiovascular Disease | Admitting: Cardiovascular Disease

## 2015-12-18 DIAGNOSIS — Z9889 Other specified postprocedural states: Secondary | ICD-10-CM

## 2015-12-18 DIAGNOSIS — Z951 Presence of aortocoronary bypass graft: Secondary | ICD-10-CM | POA: Diagnosis not present

## 2015-12-21 ENCOUNTER — Encounter (HOSPITAL_COMMUNITY)
Admission: RE | Admit: 2015-12-21 | Discharge: 2015-12-21 | Disposition: A | Discharge status: Home / Self Care | Payer: PRIVATE HEALTH INSURANCE | Source: Ambulatory Visit | Attending: Cardiovascular Disease | Admitting: Cardiovascular Disease

## 2015-12-21 DIAGNOSIS — Z951 Presence of aortocoronary bypass graft: Secondary | ICD-10-CM

## 2015-12-21 DIAGNOSIS — Z9889 Other specified postprocedural states: Secondary | ICD-10-CM

## 2015-12-23 ENCOUNTER — Encounter: Payer: Self-pay | Admitting: Internal Medicine

## 2015-12-23 ENCOUNTER — Encounter (HOSPITAL_COMMUNITY)
Admission: RE | Admit: 2015-12-23 | Discharge: 2015-12-23 | Disposition: A | Discharge status: Home / Self Care | Payer: PRIVATE HEALTH INSURANCE | Source: Ambulatory Visit | Attending: Cardiovascular Disease | Admitting: Cardiovascular Disease

## 2015-12-23 DIAGNOSIS — Z9889 Other specified postprocedural states: Secondary | ICD-10-CM

## 2015-12-23 DIAGNOSIS — Z951 Presence of aortocoronary bypass graft: Secondary | ICD-10-CM | POA: Diagnosis not present

## 2015-12-25 ENCOUNTER — Encounter (HOSPITAL_COMMUNITY)
Admission: RE | Admit: 2015-12-25 | Discharge: 2015-12-25 | Disposition: A | Discharge status: Home / Self Care | Payer: PRIVATE HEALTH INSURANCE | Source: Ambulatory Visit | Attending: Cardiovascular Disease | Admitting: Cardiovascular Disease

## 2015-12-25 DIAGNOSIS — Z9889 Other specified postprocedural states: Secondary | ICD-10-CM

## 2015-12-25 DIAGNOSIS — Z951 Presence of aortocoronary bypass graft: Secondary | ICD-10-CM

## 2015-12-28 ENCOUNTER — Encounter (HOSPITAL_COMMUNITY): Payer: PRIVATE HEALTH INSURANCE

## 2015-12-30 ENCOUNTER — Encounter (HOSPITAL_COMMUNITY)
Admission: RE | Admit: 2015-12-30 | Discharge: 2015-12-30 | Disposition: A | Discharge status: Home / Self Care | Payer: PRIVATE HEALTH INSURANCE | Source: Ambulatory Visit | Attending: Cardiovascular Disease | Admitting: Cardiovascular Disease

## 2015-12-30 VITALS — Ht 73.0 in | Wt 212.7 lb

## 2015-12-30 DIAGNOSIS — Z9889 Other specified postprocedural states: Secondary | ICD-10-CM

## 2015-12-30 DIAGNOSIS — Z951 Presence of aortocoronary bypass graft: Secondary | ICD-10-CM

## 2015-12-30 NOTE — Progress Notes (Signed)
Discharge Summary  Patient Details  Name: BRODEN HOLT MRN: [id number] Date of Birth: 1950-10-20 Referring Provider:   Flowsheet Row CARDIAC REHAB PHASE II ORIENTATION from 10/01/2015 in [place]  Referring Provider  Ancil Linsey, MD       Number of Visits: 32 Reason for Discharge:  Patient reached a stable level of exercise. Patient independent in their exercise.  Smoking History:  History  Smoking Status  . Former Smoker  Smokeless Tobacco  . Former Systems developer  . 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  07/09/15 S/P aortic valve repair  ADL UCSD:   Initial Exercise Prescription:     Initial Exercise Prescription - 10/01/15 1400      Date of Initial Exercise RX and Referring Provider   Date 10/01/15   Referring Provider Ancil Linsey, MD     Bike   Level 0.8   Minutes 10   METs 2.51     NuStep   Level 3   Minutes 10   METs 2     Track   Laps 11   Minutes 10   METs 2.92     Prescription Details   Frequency (times per week) 3   Duration Progress to 30 minutes of continuous aerobic without signs/symptoms of physical distress     Intensity   THRR 40-80% of Max Heartrate 61-122   Ratings of Perceived Exertion 11-13   Perceived Dyspnea 0-4     Progression   Progression Continue to progress workloads to maintain intensity without signs/symptoms of physical distress.     Resistance Training   Training Prescription Yes   Weight 2lbs   Reps 10-12      Discharge Exercise Prescription (Final Exercise Prescription Changes):     Exercise Prescription Changes - 12/24/15 1200      Exercise Review   Progression Yes     Response to Exercise   Blood Pressure (Admit) 104/50   Blood Pressure (Exercise) 118/58   Blood Pressure (Exit) 120/60   Heart Rate (Admit) 103 bpm   Heart Rate (Exercise) 140 bpm   Heart Rate (Exit) 104 bpm   Rating of Perceived Exertion (Exercise) 13   Comments Reviewed HEP  on 10/16/15   Duration Progress to 30 minutes of continuous aerobic without signs/symptoms of physical distress   Intensity THRR unchanged     Progression   Progression Continue to progress workloads to maintain intensity without signs/symptoms of physical distress.   Average METs 3.2     Resistance Training   Training Prescription Yes   Weight 7lbs   Reps 10-12     Interval Training   Interval Training No     Bike   Level 1   Minutes 10   METs 2.96     NuStep   Level 4   Minutes 10   METs 3.6     Track   Laps 14   Minutes 10   METs 3.43     Home Exercise Plan   Plans to continue exercise at Home  Reviewed on 10/16/15. See progress note   Frequency Add 2 additional days to program exercise sessions.      Functional Capacity:     6 Minute Walk    Row Name 10/01/15 1415         6 Minute Walk   Phase Initial     Distance 1417 feet     Walk Time 6 minutes     #  of Rest Breaks 0     MPH 2.7     METS 3.3     RPE 12     VO2 Peak 11.42     Symptoms No     Resting HR 90 bpm     Resting BP 118/62     Max Ex. HR 119 bpm     Max Ex. BP 138/62     2 Minute Post BP 126/62        Psychological, QOL, Others - Outcomes: PHQ 2/9: Depression screen [facility] 2/9 12/30/2015 10/05/2015 10/09/2014  Decreased Interest 0 0 0  Down, Depressed, Hopeless 0 0 0  PHQ - 2 Score 0 0 0  Some recent data might be hidden    Quality of Life:     Quality of Life - 10/01/15 1420      Quality of Life Scores   Health/Function Pre 25.27 %   Socioeconomic Pre 26.93 %   Psych/Spiritual Pre 24.5 %   Family Pre 26.4 %   GLOBAL Pre 25.62 %      Personal Goals: Goals established at orientation with interventions provided to work toward goal.     Personal Goals and Risk Factors at Admission - 10/01/15 0835      Core Components/Risk Factors/Patient Goals on Admission    Weight Management Yes;Weight Loss   Intervention Weight Management: Develop a combined nutrition and exercise  program designed to reach desired caloric intake, while maintaining appropriate intake of nutrient and fiber, sodium and fats, and appropriate energy expenditure required for the weight goal.;Weight Management: Provide education and appropriate resources to help participant work on and attain dietary goals.;Weight Management/Obesity: Establish reasonable short term and long term weight goals.   Expected Outcomes Long Term: Adherence to nutrition and physical activity/exercise program aimed toward attainment of established weight goal;Short Term: Continue to assess and modify interventions until short term weight is achieved;Weight Maintenance: Understanding of the daily nutrition guidelines, which includes 25-35% calories from fat, 7% or less cal from saturated fats, less than 226m cholesterol, less than 1.5gm of sodium, & 5 or more servings of fruits and vegetables daily;Weight Loss: Understanding of general recommendations for a balanced deficit meal plan, which promotes 1-2 lb weight loss per week and includes a negative energy balance of [phone] kcal/d;Understanding recommendations for meals to include 15-35% energy as protein, 25-35% energy from fat, 35-60% energy from carbohydrates, less than 201mof dietary cholesterol, 20-35 gm of total fiber daily;Understanding of distribution of calorie intake throughout the day with the consumption of 4-5 meals/snacks   Sedentary Yes   Intervention Provide advice, education, support and counseling about physical activity/exercise needs.;Develop an individualized exercise prescription for aerobic and resistive training based on initial evaluation findings, risk stratification, comorbidities and participant's personal goals.   Expected Outcomes Achievement of increased cardiorespiratory fitness and enhanced flexibility, muscular endurance and strength shown through measurements of functional capacity and personal statement of participant.   Increase Strength and  Stamina Yes   Intervention Provide advice, education, support and counseling about physical activity/exercise needs.;Develop an individualized exercise prescription for aerobic and resistive training based on initial evaluation findings, risk stratification, comorbidities and participant's personal goals.   Expected Outcomes Achievement of increased cardiorespiratory fitness and enhanced flexibility, muscular endurance and strength shown through measurements of functional capacity and personal statement of participant.   Diabetes Yes   Intervention Provide education about signs/symptoms and action to take for hypo/hyperglycemia.;Provide education about proper nutrition, including hydration, and aerobic/resistive exercise prescription along with  prescribed medications to achieve blood glucose in normal ranges: Fasting glucose 65-99 mg/dL   Expected Outcomes Short Term: Participant verbalizes understanding of the signs/symptoms and immediate care of hyper/hypoglycemia, proper foot care and importance of medication, aerobic/resistive exercise and nutrition plan for blood glucose control.   Hypertension Yes   Intervention Provide education on lifestyle modifcations including regular physical activity/exercise, weight management, moderate sodium restriction and increased consumption of fresh fruit, vegetables, and low fat dairy, alcohol moderation, and smoking cessation.;Monitor prescription use compliance.   Expected Outcomes Short Term: Continued assessment and intervention until BP is < 140/28m HG in hypertensive participants. < 130/879mHG in hypertensive participants with diabetes, heart failure or chronic kidney disease.;Long Term: Maintenance of blood pressure at goal levels.   Lipids Yes   Intervention Provide education and support for participant on nutrition & aerobic/resistive exercise along with prescribed medications to achieve LDL <7063mHDL >52m65m Expected Outcomes Short Term: Participant  states understanding of desired cholesterol values and is compliant with medications prescribed. Participant is following exercise prescription and nutrition guidelines.;Long Term: Cholesterol controlled with medications as prescribed, with individualized exercise RX and with personalized nutrition plan. Value goals: LDL < 70mg91mL > 40 mg.   Personal Goal Other Yes   Personal Goal Improve SOB with activities and lose weight. Goal weight at 190   Intervention Provide education on nutrition guidelines for diabetes and heart health. Also develop an exercise program to improve exercise tolerance and capacity.   Expected Outcomes Pt will be able to exercise without difficulty or unsual SOB and lose weight       Personal Goals Discharge:     Goals and Risk Factor Review    Row Name 10/16/15 0819 10/30/15 1650 12/24/15 1357         Core Components/Risk Factors/Patient Goals Review   Personal Goals Review Improve shortness of breath with ADL's Other;Improve shortness of breath with ADL's Other;Improve shortness of breath with ADL's     Review Pt is experiencing less SOB with activities and experiencing less vertigo symptoms Pt stated that SOB has improved and is able to tolerate exercise very well in cardiac rehab Pt was out sick for roughly two weeks but he has returned to exercise and is doing well.  Pt is approaching graduation and plans to continue exercise at home 5 days/wk for 30 min/day     Expected Outcomes Pt will continue to do exercises and perform activities with less SOB Pt will continue to increase aerobic capacity and challenge CV system; will request a THR increase from cardiologist Pt will continue to increase aerobic capacity and challenge CV system; will request a THR increase from cardiologist        Nutrition & Weight - Outcomes:     Pre Biometrics - 10/01/15 1419      Pre Biometrics   Waist Circumference 43.5 inches   Hip Circumference 44 inches   Waist to Hip Ratio  0.99 %   Triceps Skinfold 23 mm   % Body Fat 30.9 %   Grip Strength 37.5 kg   Flexibility 7.5 in   Single Leg Stand 1.23 seconds       Nutrition:     Nutrition Therapy & Goals - 10/05/15 1134      Nutrition Therapy   Diet Carb Modified, Therapeutic Lifestyle Changes     Personal Nutrition Goals   Personal Goal #1 1-2 lb wt loss/week to a wt loss goal of 6-24 lb   Personal Goal #  2 Improved glycemic control as evidenced by a decrease in A1c from 8.0 toward less than 7.0     Intervention Plan   Intervention Prescribe, educate and counsel regarding individualized specific dietary modifications aiming towards targeted core components such as weight, hypertension, lipid management, diabetes, heart failure and other comorbidities.   Expected Outcomes Short Term Goal: Understand basic principles of dietary content, such as calories, fat, sodium, cholesterol and nutrients.;Long Term Goal: Adherence to prescribed nutrition plan.      Nutrition Discharge:     Nutrition Assessments - 10/05/15 1134      MEDFICTS Scores   Pre Score 12      Education Questionnaire Score:     Knowledge Questionnaire Score - 10/01/15 1415      Knowledge Questionnaire Score   Pre Score 22/24      Goals reviewed with patient. Pt graduated from cardiac rehab program today with completion of 32 exercise sessions in Phase II. Pt maintained good attendance with one extended absence due to having a cold.  Pt progressed nicely during his participation in rehab as evidenced by increased MET level.   Medication list reconciled. Repeat  PHQ score-0 .  Pt has supportive wife and at times is discouraged as he deals with long term disease - disease.  Pt completed post QOL survey. Pt scored the following showing improvement in all areas.     Quality of Life - 01/14/16 1626      Quality of Life Scores   Health/Function Pre 25.27 %   Health/Function Post 27.2 %   Health/Function % Change 7.64 %    Socioeconomic Pre 26.93 %   Socioeconomic Post 27.43 %   Socioeconomic % Change  1.86 %   Psych/Spiritual Pre 24.5 %   Psych/Spiritual Post 24.86 %   Psych/Spiritual % Change 1.47 %   Family Pre 26.4 %   Family Post 30 %   Family % Change 13.64 %   GLOBAL Pre 25.62 %   GLOBAL Post 27.18 %   GLOBAL % Change 6.09 %       Pt has made significant lifestyle changes and should be commended for his success.  Pt feels he has achieved his goals during cardiac rehab.  Pt has improved shortness of breath with activities.  Pt desired to lose weight with a goal weight of 185-190.  Pt has not achieved this but has made some advances toward meeting this goal.  At the beginning of his participation pt weighed 219.1.  As of today pt weighed 213.6.  Pt feels he has the tools he needs to be successful in eventually achieving this goal.  Pt plans to continue exercise at [employer] with alternating with walking.  Pt encouraged to exercise 7 days of week for diabetes management and overall heart health.  It was indeed a pleasure to have this patient participate in cardiac rehab. Cherre Huger, BSN

## 2016-01-01 ENCOUNTER — Encounter (HOSPITAL_COMMUNITY)
Admission: RE | Admit: 2016-01-01 | Discharge: 2016-01-01 | Disposition: A | Discharge status: Home / Self Care | Payer: PRIVATE HEALTH INSURANCE | Source: Ambulatory Visit | Attending: Cardiovascular Disease | Admitting: Cardiovascular Disease

## 2016-01-01 DIAGNOSIS — Z9889 Other specified postprocedural states: Secondary | ICD-10-CM

## 2016-01-01 DIAGNOSIS — Z951 Presence of aortocoronary bypass graft: Secondary | ICD-10-CM | POA: Diagnosis not present

## 2016-01-04 ENCOUNTER — Encounter (HOSPITAL_COMMUNITY): Admission: RE | Admit: 2016-01-04 | Payer: PRIVATE HEALTH INSURANCE | Source: Ambulatory Visit

## 2016-01-04 HISTORY — PX: OTHER SURGICAL HISTORY: SHX169

## 2016-01-05 ENCOUNTER — Encounter: Payer: Self-pay | Admitting: Nutrition

## 2016-01-06 ENCOUNTER — Encounter (HOSPITAL_COMMUNITY): Payer: PRIVATE HEALTH INSURANCE

## 2016-01-08 ENCOUNTER — Encounter (HOSPITAL_COMMUNITY): Payer: PRIVATE HEALTH INSURANCE

## 2016-01-11 ENCOUNTER — Encounter (HOSPITAL_COMMUNITY): Payer: PRIVATE HEALTH INSURANCE

## 2016-01-12 ENCOUNTER — Encounter: Payer: PRIVATE HEALTH INSURANCE | Attending: Internal Medicine | Admitting: Nutrition

## 2016-01-12 DIAGNOSIS — E1142 Type 2 diabetes mellitus with diabetic polyneuropathy: Secondary | ICD-10-CM

## 2016-01-12 NOTE — Progress Notes (Signed)
Patient was trained on the 630G insulin pump.  He chose not to get the sensors due to cost.  Settings were taken from his Animas pump.  Basal rate:  MN: 0.975, 7AM:1.1, 11AM: 1.2, 11PM: 0.975,   I/C: MN: 15, 6AM: 8, 4PM: 9,  ISF: 30, 11AM: 25, 6PM: 30, Target: 120, timing: 4 hours.     We reviewed how/when to give a bolus, use temp.basal, and combo boluses.  He redemonstrated each one, correctly, and , and had no final questions.  He was told to call if questions or problems.  He signed off on the pump checkllist, as understanding all topics

## 2016-01-13 ENCOUNTER — Encounter (HOSPITAL_COMMUNITY): Payer: PRIVATE HEALTH INSURANCE

## 2016-01-13 ENCOUNTER — Telehealth: Payer: Self-pay | Admitting: Nutrition

## 2016-01-13 NOTE — Patient Instructions (Signed)
Call if questions  Continue to test blood sugars before meals and at bedtime Read over the 2 booklets on Medtronic pump therapy, and review  Back of book 2 when doing cartridge/infusion set changes.

## 2016-01-13 NOTE — Telephone Encounter (Signed)
Message left on machine to ask how he is doing on his new pump.  Asked him to call me back before 5PM today.

## 2016-01-15 ENCOUNTER — Encounter (HOSPITAL_COMMUNITY): Payer: PRIVATE HEALTH INSURANCE

## 2016-01-17 ENCOUNTER — Other Ambulatory Visit: Payer: Self-pay | Admitting: Family Medicine

## 2016-01-18 ENCOUNTER — Encounter (HOSPITAL_COMMUNITY): Payer: PRIVATE HEALTH INSURANCE

## 2016-01-18 ENCOUNTER — Encounter (HOSPITAL_COMMUNITY): Payer: Self-pay | Admitting: *Deleted

## 2016-01-18 NOTE — Addendum Note (Signed)
Encounter addended by: Jacques EarthlyEdna Brewbaker Kaylyne Axton, RD on: 01/18/2016  2:53 PM<BR>    Actions taken: Flowsheet data copied forward, Visit Navigator Flowsheet section accepted

## 2016-01-18 NOTE — Telephone Encounter (Signed)
This medication is being managed by Dr. Elvera LennoxGherghe.

## 2016-01-20 ENCOUNTER — Encounter (HOSPITAL_COMMUNITY): Payer: PRIVATE HEALTH INSURANCE

## 2016-01-21 ENCOUNTER — Other Ambulatory Visit: Payer: Self-pay | Admitting: Family Medicine

## 2016-01-22 ENCOUNTER — Other Ambulatory Visit: Payer: Self-pay

## 2016-01-22 ENCOUNTER — Encounter (HOSPITAL_COMMUNITY): Payer: PRIVATE HEALTH INSURANCE

## 2016-01-22 ENCOUNTER — Telehealth: Payer: Self-pay | Admitting: Internal Medicine

## 2016-01-22 MED ORDER — LEVOTHYROXINE SODIUM 137 MCG PO TABS: 137.0000 ug | ORAL_TABLET | Freq: Every day | ORAL | 1 refills | Status: DC

## 2016-01-22 NOTE — Telephone Encounter (Signed)
Levothyroxine call to [facility] in [place] please he is completely out

## 2016-01-22 NOTE — Telephone Encounter (Signed)
rx submitted.  

## 2016-01-22 NOTE — Telephone Encounter (Signed)
Please resubmit levothyroxine (SYNTHROID, LEVOTHROID) 137 MCG tablet To pharmacy  [facility] [address] - [place], [place] - [address] [phone] (Phone) [phone] (Fax)

## 2016-01-22 NOTE — Telephone Encounter (Signed)
Pt called requesting refill for levothyroxine. Pt was advised that he would need to contact Dr. Elvera LennoxGherghe since she has been following his hypothyroidism. Pt voiced understanding.

## 2016-01-22 NOTE — Telephone Encounter (Signed)
Resubmitted rx

## 2016-01-26 ENCOUNTER — Other Ambulatory Visit: Payer: Self-pay | Admitting: Cardiovascular Disease

## 2016-01-26 DIAGNOSIS — I739 Peripheral vascular disease, unspecified: Secondary | ICD-10-CM

## 2016-01-27 ENCOUNTER — Encounter: Payer: Self-pay | Admitting: Cardiology

## 2016-01-27 ENCOUNTER — Ambulatory Visit (INDEPENDENT_AMBULATORY_CARE_PROVIDER_SITE_OTHER): Payer: PRIVATE HEALTH INSURANCE | Admitting: Internal Medicine

## 2016-01-27 ENCOUNTER — Encounter: Payer: Self-pay | Admitting: Internal Medicine

## 2016-01-27 VITALS — BP 142/72 | HR 94 | Ht 73.0 in | Wt 221.0 lb

## 2016-01-27 DIAGNOSIS — E1059 Type 1 diabetes mellitus with other circulatory complications: Secondary | ICD-10-CM

## 2016-01-27 DIAGNOSIS — E039 Hypothyroidism, unspecified: Secondary | ICD-10-CM

## 2016-01-27 DIAGNOSIS — E1065 Type 1 diabetes mellitus with hyperglycemia: Secondary | ICD-10-CM

## 2016-01-27 LAB — T4, FREE: FREE T4: 1.25 ng/dL (ref 0.60–1.60)

## 2016-01-27 LAB — TSH: TSH: 0.83 u[IU]/mL (ref 0.35–4.50)

## 2016-01-27 MED ORDER — INSULIN ASPART 100 UNIT/ML ~~LOC~~ SOLN: SUBCUTANEOUS | 3 refills | Status: DC

## 2016-01-27 NOTE — Progress Notes (Signed)
Patient ID: [name], male   DOB: 10/29/1950, 66 y.o.   MRN: [id number]  HPI: Nathaniel PepperRichard L Goldwater is a 66 y.o.-year-old male, returning for f/u for DM1, dx 501964 30(66 y/o), uncontrolled, with complications (CAD, proliferative diabetic retinopathy-history of retinal detachment OD, right carotid stenosis, cerebrovascular disease - s/p TIA 01/2014 and 05/2015, PVD, peripheral neuropathy, ED) and postsurgical hypothyroidism. Last visit 3 mo ago.  He had a triple CABG with Aortic valve replacement Fall 2017. He feels better.   He is exercising 3x a week at [employer].   DM1: Last hemoglobin A1c was: Lab Results  Component Value Date   HGBA1C 8.0 09/11/2015   HGBA1C 8.4 (H) 07/06/2015   HGBA1C 9.2 (H) 05/07/2015   Pt is on an insulin pump: One Touch Ping (Animas) with NovoLog. He got a replacement pump in 10/2014. Now on Medtronic 630 G since 01/2016. He could not get a CGM due to cost.  Pump settings: - basal rate:  MN: 1.0 >> 0.9 >> 0.975 7 am: 1.2 >> 1.1 11 am: 1.1 >> 1.2 11pm 1.1 >> 0.975 - ICR:  12 am: 15 6 am: 8 10 am 8 4 pm: 10 >> 9 - ISF:   MN: 30   11 am: 25   6 pm: 30  9 pm: 30  - target:  MN: 120 TDD bolus ave 42% (23.1 units) >> 56% TDD basal ave 58% (31.4 units) >> 44%  Pt checks his sugars 4.1x a day and they are still high - ave 266+/-113 >> 271 +/-110 >> 257 +/-120 >> 246 +/- 112: We downloaded his pump and his meter - will scan reports: - am: 56, 62-195, 258, 360 >> 76, 98-253, 397 >> 133-295 >> 191-287, 347 >> 102-190, 254, 336 >> 70-239 - 2h after b'fast:130-140 >> 202-367 >> 134, 266 >> 170-309 >> 222-455 >> 150-278, 455 >> 223-310 - before lunch: 61, 96-294, 330 >> 147-296, 481 >> 116-309 >> 81-234 >> 169-282, 492, 586 - steroids >> 204->400 - 2h after lunch: 324, 325 >> 232-423 >> 100-345 >> 76-252, 348 >> 89, 103-342, 491- steroids >> 58 x1, 276->400 - before dinner: 111-262 >> 57x1, 125-403, 454 >> 152, 222-550 (misses lunch insulin!) >> 78,  168-351, 412 >> 148->400 - bedtime: 139-333 >> 87, 116-282 >> 108-289 >> 123-356 >> 196-471 >> 166-357 >> 132, 170-401 >> 101-311, >400 - nighttime: 115, 122 >> 68 x 1 (after overbolused for a high in the evening), 181-365 >> 66 x1, 146, 215 >> 158-194 + few lows. Lowest sugar was 57(after working outside) >> 68 >> 58 (after correction of a high) ; he has hypoglycemia awareness at 100. Highest: 439 >> 481 >> 400s (site pb) >> 500s >> >400.   Glucometer: [employer].  Pt's meals are: - Breakfast: 1.5 scramble eggs, toast, milk - 65 g carbs - Lunch: sandwich, shaved chicken or Malaw[place]urkey breast, cheese, mayo, chips, soda - 100 carbs - Dinner: meat, vegetables, bread, fruit, milk - 100 g carbs - Snacks: 2-3 a day: bagel midmorning, evening snack: chips, popcorn  Pt does have mild chronic kidney disease, last BUN/creatinine was:  Lab Results  Component Value Date   BUN 17 08/10/2015   CREATININE 1.00 08/10/2015  Last microalbumin/creatinine ratio was 3.8.  Last set of lipids: Lab Results  Component Value Date   CHOL 268 (H) 05/07/2015   HDL 67 05/07/2015   LDLCALC 186 (H) 05/07/2015   LDLDIRECT 159.5 04/21/2011   TRIG 74 05/07/2015   CHOLHDL  4.0 05/07/2015  He is not on a statin, this is listed as an intolerance. He  Pt's last eye exam was in 05/2015 [facility]). He has proliferative DR OS, w/o macular edema, s/p detached retina.  Has numbness and tingling in his legs.  Also has hypothyroidism-status post thyroidectomy for multinodular goiter. Last TSH: Lab Results  Component Value Date   TSH 4.22 10/09/2014   He is on LT4 137 mcg daily.  He takes this: - fasting - with water - eats 30 min later - MVI with b'fast! - PPIs prn at night  He also has OSA-on CPAP, GERD. He also has a history of hyperlipidemia, disequilibrium, mild cognitive impairment.  I reviewed pt's medications, allergies, PMH, social hx, family hx, and changes were documented in the history of  present illness. Otherwise, unchanged from my initial visit note.  ROS: Constitutional: no weight gain, no fatigue, no subjective hyperthermia/hypothermia Eyes: no blurry vision, no xerophthalmia ENT: no sore throat, no nodules palpated in throat, no dysphagia/odynophagia, no hoarseness Cardiovascular: no CP/SOB/palpitations/ leg swelling Respiratory: no cough/SOB Gastrointestinal: no N/V/D/C Musculoskeletal: no muscle aches/joint aches Skin: no rashes Neurological: no tremors/numbness/tingling/dizziness  PE: BP (!) 142/72 (BP Location: Left Arm, Patient Position: Sitting)   Pulse 94   Ht 6\' 1"  (1.854 m)   Wt 221 lb (100.2 kg)   SpO2 97%   BMI 29.16 kg/m  Body mass index is 29.16 kg/m. Wt Readings from Last 3 Encounters:  01/27/16 221 lb (100.2 kg)  01/14/16 212 lb 11.9 oz (96.5 kg)  12/08/15 217 lb 8 oz (98.7 kg)   Constitutional: slightly overweight, in NAD Eyes: PERRLA, EOMI, no exophthalmos ENT: moist mucous membranes, no thyromegaly, no cervical lymphadenopathy Cardiovascular: RRR, no MRG Respiratory: CTA B Gastrointestinal: abdomen soft, NT, ND, BS+ Musculoskeletal: no deformities, strength intact in all 4 Skin: moist, warm, no rashes  ASSESSMENT: 1. DM1, uncontrolled, with complications - CAD, last 2-D echo 05/31/2012: EF 60-65%, mild LVF, moderate aortic stenosis, no LVWMA - cerebrovascular disease - s/p TIA 01/2014 - no neurologic deficit - proliferative diabetic retinopathy-history of retinal detachment OD - right carotid stenosis - had carotid duplex study 05/31/2012 - PVD - peripheral neuropathy - ED  He felt that Humalog was not as efficient with NovoLog and he had to switch back to NovoLog using a preauthorization.  2. Hypothyroidism  PLAN:  1.  Patient with long-standing type 1 diabetes, poorly controlled, with multiple complications, on an insulin pump.  - At the review of his pump settings today, his sugars are again high, and the main identified  reason is that he forgets to bolus before his meals. Therefore, his sugars after meals are very high and he been drops his sugars after correcting these highs. To avoid this precipitous drops in blood sugars, I will increase his insulin sensitivity factor for now, however, I strongly advised him to try to set the alarms or establish the protocol so that he does not forget to start the boluses at least 10 minutes before he eats. - Otherwise, I will leave the basal rates the same for now, although we may need to decrease the overnight basal rates at next visit. - HbA1c today 8.4% (higher) -  I advised him to change the pump settings as below: Patient Instructions   Please change the pump settings: - basal rate:  MN: 0.975 7 am: 1.1 11 am: 1.2 11pm: 0.975 - ICR:  12 am: 15 6 am: 8 10 am 8 4 pm:  9 - ISF:   MN: 30 >> 40  11 am: 25 >> 40  6 pm: 30 >> 40  9 pm: 30 >> 40 - target:  MN: 120  Please try to remember to bolus before EVERY meal. Start the bolus at least 10 min before the start of the meal.  Use a temporary basal rate of 50% during exercise and may need to extend this to 1-2h after exercise.  Please stop at the lab.  - We'll see him back in 3 months  2. Postsurgical hypothyroidism - Last TSH was obtained >a year ago and he was at the upper limit of normal - At last visit, he was taking a multivitamin in a.m. with breakfast, approximately 30 minutes after his levothyroxine. I advised him to move the multivitamins later. He is now taking the levothyroxine correctly.  - Will recheck a TSH  Today - Continue  levothyroxine 137 g for now   - time spent with the patient: 40 min, of which >50% was spent in reviewing his pump downloads, discussing his hypo- and hyper-glycemic episodes, reviewing his previous labs and pump settings and developing a plan to avoid hypo- an hyper-glycemia.  we also discussed about his hypothyroidism today.   Component     Latest Ref Rng & Units  01/27/2016  TSH     0.35 - 4.50 uIU/mL 0.83  T4,Free(Direct)     0.60 - 1.60 ng/dL 7.82   Normal TFTs.  Carlus Pavlov, MD PhD [facility] Endocrinology

## 2016-01-27 NOTE — Patient Instructions (Addendum)
  Please change the pump settings: - basal rate:  MN: 0.975 7 am: 1.1 11 am: 1.2 11pm: 0.975 - ICR:  12 am: 15 6 am: 8 10 am 8 4 pm: 9 - ISF:   MN: 30 >> 40  11 am: 25 >> 40  6 pm: 30 >> 40  9 pm: 30 >> 40 - target:  MN: 120   Please try to remember to bolus before EVERY meal. Start the bolus at least 10 min before the start of the meal.  Use a temporary basal rate of 50% during exercise and may need to extend this to 1-2h after exercise.  Please stop at the lab.

## 2016-01-28 ENCOUNTER — Ambulatory Visit (HOSPITAL_COMMUNITY)
Admission: RE | Admit: 2016-01-28 | Discharge: 2016-01-28 | Disposition: A | Discharge status: Home / Self Care | Payer: PRIVATE HEALTH INSURANCE | Source: Ambulatory Visit | Attending: Cardiovascular Disease | Admitting: Cardiovascular Disease

## 2016-01-28 DIAGNOSIS — E785 Hyperlipidemia, unspecified: Secondary | ICD-10-CM | POA: Insufficient documentation

## 2016-01-28 DIAGNOSIS — Z8673 Personal history of transient ischemic attack (TIA), and cerebral infarction without residual deficits: Secondary | ICD-10-CM | POA: Diagnosis not present

## 2016-01-28 DIAGNOSIS — E1142 Type 2 diabetes mellitus with diabetic polyneuropathy: Secondary | ICD-10-CM | POA: Diagnosis not present

## 2016-01-28 DIAGNOSIS — Z87891 Personal history of nicotine dependence: Secondary | ICD-10-CM | POA: Insufficient documentation

## 2016-01-28 DIAGNOSIS — I2510 Atherosclerotic heart disease of native coronary artery without angina pectoris: Secondary | ICD-10-CM | POA: Diagnosis not present

## 2016-01-28 DIAGNOSIS — I739 Peripheral vascular disease, unspecified: Secondary | ICD-10-CM | POA: Diagnosis not present

## 2016-01-28 DIAGNOSIS — I10 Essential (primary) hypertension: Secondary | ICD-10-CM | POA: Diagnosis not present

## 2016-01-28 DIAGNOSIS — E1151 Type 2 diabetes mellitus with diabetic peripheral angiopathy without gangrene: Secondary | ICD-10-CM | POA: Diagnosis not present

## 2016-01-29 ENCOUNTER — Encounter: Payer: Self-pay | Admitting: Family Medicine

## 2016-02-02 ENCOUNTER — Other Ambulatory Visit: Payer: Self-pay | Admitting: Cardiovascular Disease

## 2016-02-02 DIAGNOSIS — I6521 Occlusion and stenosis of right carotid artery: Secondary | ICD-10-CM

## 2016-02-14 ENCOUNTER — Encounter: Payer: Self-pay | Admitting: Family Medicine

## 2016-02-15 ENCOUNTER — Encounter: Payer: Self-pay | Admitting: Family Medicine

## 2016-02-16 ENCOUNTER — Telehealth: Payer: Self-pay | Admitting: Cardiovascular Disease

## 2016-02-16 NOTE — Telephone Encounter (Signed)
Requesting surgical clearance:  1. Type of surgery: Rt. Shoulder Arthroscopy, Subacromial Decompression, Distal Clavicle Excision, Rotator Cuff Repair  2. Surgeon: Dr. Tasia Catchingsraig  3.Surgical Date:  To be determined  4. Medications that need to be held: Plavix and ASA--7 days prior    5. CAD: Yes  6. I will defer to:  Dr. Conception OmsBerry   Contact Information:  [facility] Phone: [phone](336) [phone] Fax:  [phone]

## 2016-02-16 NOTE — Telephone Encounter (Signed)
Cleared for his orthopedic surgery at low risk.

## 2016-02-17 NOTE — Telephone Encounter (Signed)
Routed to number provided via EPIC. 

## 2016-02-18 ENCOUNTER — Encounter: Payer: Self-pay | Admitting: Family Medicine

## 2016-02-18 ENCOUNTER — Ambulatory Visit (INDEPENDENT_AMBULATORY_CARE_PROVIDER_SITE_OTHER): Payer: PRIVATE HEALTH INSURANCE | Admitting: Family Medicine

## 2016-02-18 VITALS — BP 123/79 | HR 95 | Temp 98.3°F | Resp 16 | Ht 73.0 in | Wt 225.5 lb

## 2016-02-18 DIAGNOSIS — R6889 Other general symptoms and signs: Secondary | ICD-10-CM | POA: Diagnosis not present

## 2016-02-18 DIAGNOSIS — B349 Viral infection, unspecified: Secondary | ICD-10-CM | POA: Diagnosis not present

## 2016-02-18 LAB — POCT INFLUENZA A/B
Influenza A, POC: NEGATIVE
Influenza B, POC: NEGATIVE

## 2016-02-18 NOTE — Progress Notes (Signed)
Pre visit review using our clinic review tool, if applicable. No additional management support is needed unless otherwise documented below in the visit note. 

## 2016-02-18 NOTE — Progress Notes (Signed)
OFFICE VISIT  02/18/2016   CC:  Chief Complaint  Patient presents with  . Sore Throat    with cough, and body aches x 2 days   HPI:    Patient is a 66 y.o. Caucasian male who presents for respiratory symptoms. Onset 2 d/a, ST.  Had some HA/head pressure the day prior. Yesterday he started having some coughing.  Noted mild muscle aches beginning this morning.  No fevers. No SOB, chest tightness, wheezing.  ST has eased off a bit.  Taking mucinex and antihistamine.  Slight nasal cong/PND. No n/v/d. He did not get flu vaccine this season.   Past Medical History:  Diagnosis Date  . Abnormal stress test 10/27/2011   lat isch--cardiac cath 10/28/2011  . Aortic stenosis, moderate Sept/Oct /2013   a. Mild/mod by echo; mild by cath 10/28/11; b. Mild progression by echo 05/2012; c. Further mild progression on 01/2014 echo (valve area 0.93 cm2).  d. Further progression on echo 01/2015; e. 07/2015 [facility]p [facility] Magna Ease bovine pericardial valve model 3300 TFX, ser # [id number].  . 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 Inferolateral perfusion defect + EKG abnormality during stress testing prompted Cath by [facility]: 3V CAD, EF normal.  Med mgmt recommended; b. 06/2015 Cath: 3VD and mod AS; c. 07/2015 CABG x 3 (LIMA->LAD, VG->OM2, VG->PDA) w/ AVR.  [name]. Carotid stenosis, right 09/2011   Dr. Allyson SabalBerry did R carotid stenting 01/2014.  Carotid dopplers 06/2015 essentially normal.  . Complication of anesthesia    difficulty with intubation,   . Decreased pedal pulses    LE doppler 11/08/11- no evidence of arterial insufficiency  . Diabetes mellitus Dx'd age 66   Novolog via insulin pump; sub-optimal control x years  . Diabetic neuropathy (HCC)    fine touch and position sense affected  . Diabetic retinopathy    Proliferative: Hx of retinal detachment on right-light perception only in right eye  . Diastolic dysfunction 2007; 2016   TEE with diastolic dysfunction, EF 74%.  . Difficult intubation    ~ 2002 difficult  fiberoptic intubation with takeback bleeding s/p thyroidectomy;  for thyroidectomy was intubated DL X 1 with cricoid pressure but difficult mask (full beard)  . Dizziness    feels off balance  . Erectile dysfunction    [place]rolog[place]  . GERD (gastroesophageal reflux disease)   . High cholesterol    a. intolerant of statins/zeta.  . History of tobacco abuse    Quit about 1990 (has 35 pack-yr hx)  . Hypothyroidism, postsurgical    Thyroidectomy 2002; multinodular goiter.  Dr. Elvera LennoxGherghe managing this as of 10/2015.  . Impaired vision    right eye light perception only; hx of retinal detachment.  . Impingement syndrome of right shoulder    08/26/15 Pt got subacromial steroid injection by orthopedist (Dr. Delfin EdisJohn Ritchie).  [facility] [place] f/u 01/2016--MRI showed RC tear + AC joint arthritis, arthroscopic surgery planned as of 02/10/16.  [name]. Neuromuscular disorder (HCC)    neuropathy  . OSA (obstructive sleep apnea)    06/2011 sleep study: moderate OSA, CPAP at 12 cm H2O.  . Osteoarthritis    Bilat thumb carpometacarpal joints.  Ortho injected each thumb x 2 in 2017.  [name]. Recurrent pneumonia   . Shortness of breath dyspnea   . Stroke [facility](HCC)    TIA  . TIA (transient ischemic attack) 2015/16   with R carotid dz: pt got R carotid stent 01/2014 by Dr. Allyson SabalBerry and was put on dual  antiplatelet therapy with ASA 325mg  and plavix 75mg  qd afterwards  . TIA (transient ischemic attack) 05/2015   Left brain (right sided numbness + slurred speech)  Admitted for obs/workup 05/2015.  [name] Venous insufficiency   . Venous reflux 10/14/2011   venous doppler-R GSV continuous reflux throughout; too small for VNUS closure    Past Surgical History:  Procedure Laterality Date  . ABI  01/2016   Normal.  Dr. [name] to repeat in 1 yr.  . AORTIC VALVE REPLACEMENT N/A 07/09/2015   Bovine pericardial valve.  Procedure: AORTIC VALVE REPLACEMENT (AVR);  Surgeon: Loreli Slot, MD;  Location: [facility] OR;  Service: Open Heart  Surgery;  Laterality: N/A;  . CARDIAC CATHETERIZATION  10/2011   EF normal.  Diffuse 3 vessel CAD, no stents placed.  Medical mgmt per [facility].  [name] CARDIAC CATHETERIZATION N/A 06/22/2015   Procedure: Right/Left Heart Cath and Coronary Angiography;  Surgeon: Runell Gess, MD;  Location: [facility] INVASIVE CV LAB;  Service: Cardiovascular;  Laterality: N/A;  . carotid dopplers  06/30/15   Normal: repeat when clinically indicated per Dr. [name]  . CAROTID STENT INSERTION Right 01/16/2014   Procedure: CAROTID STENT INSERTION;  Surgeon: Runell Gess, MD;  Location: [facility] CATH LAB;  Service: Cardiovascular;  Laterality: Right;  . CATARACT EXTRACTION     left  . CORONARY ARTERY BYPASS GRAFT N/A 07/09/2015   Procedure: CORONARY ARTERY BYPASS GRAFTING (CABG)TIMES THREE USING LEFT INTERNAL MAMMARY ARTERY AND LEFT SAPHENOUS LEG VEIN HARVESTED ENDOSCOPICALLY;  Surgeon: Loreli Slot, MD;  Location: [facility] OR;  Service: Open Heart Surgery;  Laterality: N/A;  . EYE SURGERY     Multiple laser surgeries for diabetic retinopathy, also cataract surgery OU.  [name] INTRAOPERATIVE TRANSESOPHAGEAL ECHOCARDIOGRAM N/A 07/09/2015   Procedure: INTRAOPERATIVE TRANSESOPHAGEAL ECHOCARDIOGRAM;  Surgeon: Loreli Slot, MD;  Location: [facility] OR;  Service: Open Heart Surgery;  Laterality: N/A;  . lasix eye    . LEFT AND RIGHT HEART CATHETERIZATION WITH CORONARY ANGIOGRAM N/A 10/28/2011   Procedure: LEFT AND R0IHT HEART CATHETERIZATION WITH CORONARY ANGIOGRAM;  Surgeon: Lennette Bihari, MD;  Location: [facility] CATH LAB;  Service: Cardiovascular;  Laterality: N/A;  . RETINAL DETACHMENT SURGERY     Right eye  . THYROID SURGERY  2002  . TRANSTHORACIC ECHOCARDIOGRAM  10/2011;12/2012;01/2015; 08/2015   Mod AS, mild LVH, EF 60-65%, no RWMA.  01/2015 EF 60-65%, grade I DD, progression of mod/sev AS.  08/2015 normal lv fxn with normal fxning bioprosthetic Ao valve.    Outpatient Medications Prior to Visit  Medication Sig Dispense Refill  . aspirin  EC 81 MG tablet Take 81 mg by mouth daily.    . clopidogrel (PLAVIX) 75 MG tablet Take 1 tablet (75 mg total) by mouth daily. 90 tablet 3  . clotrimazole-betamethasone (LOTRISONE) lotion Apply 1 application topically daily as needed (rash).    . FEVERFEW PO Take 1 capsule by mouth daily. To prevent migraines    . insulin aspart (NOVOLOG) 100 UNIT/ML injection Inject up to 60 units a day in the insulin pump 90 mL 3  . Insulin Human (INSULIN PUMP) SOLN Inject into the skin continuous. Basal rate .95/hr, current pump settings: 12am .7, 2am 1, 7am 1.1, 11am .9, 5pm .9.25, 11pm .85. Uses Novolog Insulin.    [name] levothyroxine (SYNTHROID, LEVOTHROID) 137 MCG tablet Take 1 tablet (137 mcg total) by mouth daily. 90 tablet 1  . Multiple Vitamins-Minerals (ONE-A-DAY MENS 50+ ADVANTAGE) TABS Take 1 tablet by mouth daily with breakfast.    .  ranitidine (ZANTAC) 150 MG tablet Take 150 mg by mouth at bedtime as needed for heartburn.     . tadalafil (CIALIS) 20 MG tablet Take 1 tablet (20 mg total) by mouth daily as needed for erectile dysfunction. 15 tablet 6   No facility-administered medications prior to visit.     Allergies  Allergen Reactions  . Rosuvastatin Calcium Other (See Comments)    Problem with higher dosages (Lipitor caused memory issues), also caused leg pains and lethargy  . Statins Other (See Comments)    Problem with higher dosages (Lipitor caused memory issues), also caused leg pains and lethargy  . Tape Rash and Other (See Comments)    Adhesive tape.  (Paper tape OK)    ROS As per HPI  PE: Blood pressure 123/79, pulse 95, temperature 98.3 F (36.8 C), temperature source Temporal, resp. rate 16, height 6\' 1"  (1.854 m), weight 225 lb 8 oz (102.3 kg), SpO2 96 %. VS: noted--normal. Gen: alert, NAD, NONTOXIC APPEARING. HEENT: eyes without injection, drainage, or swelling.  Ears: EACs clear, TMs with normal light reflex and landmarks.  Nose: Clear rhinorrhea, with some dried, crusty  exudate adherent to mildly injected mucosa.  No purulent d/c.  No paranasal sinus TTP.  No facial swelling.  Throat and mouth without focal lesion.  No pharyngial swelling, erythema, or exudate.   Neck: supple, no LAD.   LUNGS: CTA bilat, nonlabored resps.   CV: RRR, no m/r/g. EXT: no c/c/e SKIN: no rash  LABS:    Chemistry      Component Value Date/Time   NA 139 08/10/2015 1645   K 5.3 08/10/2015 1645   CL 103 08/10/2015 1645   CO2 26 08/10/2015 1645   BUN 17 08/10/2015 1645   CREATININE 1.00 08/10/2015 1645      Component Value Date/Time   CALCIUM 8.8 08/10/2015 1645   ALKPHOS 78 07/06/2015 0855   AST 22 07/06/2015 0855   ALT 18 07/06/2015 0855   BILITOT 1.0 07/06/2015 0855     Rapid flu test today (A and B): NEG  IMPRESSION AND PLAN:  Viral syndrome/mild influenza-like illness.  I really don't think this is influenza. Continue symptomatic care with mucinex DM and otc nonsedating antihistamine + saline nasal spray. Tylenol 1000 mg q6h prn muscle aches.  An After Visit Summary was printed and given to the patient.  FOLLOW UP: Return if symptoms worsen or fail to improve.  Signed:  Santiago Bumpers, MD           02/18/2016

## 2016-03-03 HISTORY — PX: ROTATOR CUFF REPAIR W/ DISTAL CLAVICLE EXCISION: SHX2365

## 2016-03-08 ENCOUNTER — Other Ambulatory Visit: Payer: Self-pay | Admitting: Cardiovascular Disease

## 2016-03-22 ENCOUNTER — Other Ambulatory Visit: Payer: Self-pay | Admitting: *Deleted

## 2016-03-22 MED ORDER — CLOTRIMAZOLE-BETAMETHASONE 1-0.05 % EX LOTN: 1.0000 "application " | TOPICAL_LOTION | Freq: Every day | CUTANEOUS | 2 refills | Status: DC | PRN

## 2016-03-22 NOTE — Telephone Encounter (Signed)
[facility] [address]  RF request for clotrimazole/Betameth Cream LOV: 10/09/14 - most recent f/u Next ov: None Last written: 03/17/15 #45g w/ 2RF

## 2016-04-06 ENCOUNTER — Encounter: Payer: Self-pay | Admitting: Family Medicine

## 2016-04-28 ENCOUNTER — Ambulatory Visit: Payer: Self-pay | Admitting: Internal Medicine

## 2016-05-23 ENCOUNTER — Encounter: Payer: Self-pay | Admitting: Family Medicine

## 2016-06-04 ENCOUNTER — Other Ambulatory Visit: Payer: Self-pay | Admitting: Cardiovascular Disease

## 2016-06-17 ENCOUNTER — Other Ambulatory Visit: Payer: Self-pay | Admitting: Pharmacist

## 2016-06-17 ENCOUNTER — Encounter: Payer: Self-pay | Admitting: Cardiovascular Disease

## 2016-06-17 ENCOUNTER — Ambulatory Visit (INDEPENDENT_AMBULATORY_CARE_PROVIDER_SITE_OTHER): Payer: PRIVATE HEALTH INSURANCE | Admitting: Cardiovascular Disease

## 2016-06-17 VITALS — BP 142/70 | HR 86 | Ht 74.0 in | Wt 224.6 lb

## 2016-06-17 DIAGNOSIS — I6521 Occlusion and stenosis of right carotid artery: Secondary | ICD-10-CM | POA: Diagnosis not present

## 2016-06-17 DIAGNOSIS — I350 Nonrheumatic aortic (valve) stenosis: Secondary | ICD-10-CM

## 2016-06-17 DIAGNOSIS — I10 Essential (primary) hypertension: Secondary | ICD-10-CM | POA: Diagnosis not present

## 2016-06-17 DIAGNOSIS — Z951 Presence of aortocoronary bypass graft: Secondary | ICD-10-CM

## 2016-06-17 DIAGNOSIS — E785 Hyperlipidemia, unspecified: Secondary | ICD-10-CM | POA: Diagnosis not present

## 2016-06-17 MED ORDER — EVOLOCUMAB 140 MG/ML ~~LOC~~ SOAJ: 140.0000 mg | SUBCUTANEOUS | 11 refills | Status: DC

## 2016-06-17 NOTE — Assessment & Plan Note (Signed)
History of right internal carotid artery stenosis status post carotid artery stenting by myself 01/16/14. His most recent carotid Doppler performed 06/29/15 revealed a widely patent stent. We will recheck this basis.

## 2016-06-17 NOTE — Progress Notes (Signed)
06/17/2016 [name]   1950/01/16  [id number]  Primary Physician McGowen, Maryjean Morn, MD Primary Cardiologist: Runell Gess MD [name]  HPI:  [name] is a 66 year old mildly overweight married Caucasian male, father of 3 and grandfather to 3 grandchildren, I last saw at the time of his carotid stent 09/29/15. He has a history of insulin-dependent diabetes on insulin pump, mild sleep apnea, and hyperlipidemia as well as mild aortic stenosis. He had a Myoview stress test that was abnormal which led to a heart catheterization performed by Dr. Tresa Endo in my absence revealing 70% disease in all 3 vessels with preserved LV function and mild AS. He also had moderate right ICA stenosis. He was neurologically asymptomatic. He was placed on beta-blocker and Ranexa Carotid Dopplers showed stable moderate right ICA stenosis. He was admitted to [facility] with 2 sequential TIAs. Carotid Dopplers did show moderate right ICA stenosis. The patient was seen by Dr. Pearlean Brownie, stroke neurologist, asked Dr. Imogene Burn, vascular surgeon for consultation. Dr. Imogene Burn ordered a CTA which showed high-grade ostial right internal carotid artery stenosis. I am asked to see the patient for consideration of carotid artery stenting. The patient was offered carotid endarterectomy. [name] apparently had a bad experience remotely being intubated at the time of the parathyroid operation and does not want to be reintubated again if at all possible making carotid stenting the best option for revascularization. I perform this on 01/16/14 successfully without complication. His follow-up Doppler 2 weeks later showed his carotid artery widely patent. Since I saw him 6 months ago he's been admitted with TIA type symptoms with negative workup. He also complains of chronic dizziness and increasing dyspnea on exertion. This recent 2-D echo showed increase in hisaortic stenosis now to moderately severewith a peak gradient  that has increased from 35 up to 52 mmHg. [name] underwent right and left heart cath by myself 06/22/15 revealing moderate aortic stenosis with three-vessel coronary artery disease. Essentially underwent elective aortic valve replacement with a Medtronic magna ease 23 mm pericardial tissue valve and bypass grafting X 3 on 07/09/15 with a LIMA to his LAD, vein to obtuse marginal branch #2 and to the PDA. He did well. Postoperatively. Follow-up 2-D echocardiogram performed 08/19/15 revealed normal LV function with a well functioning aortic bioprosthesis. He feels clinically improved. He has been completely asymptomatic since his CABG/AVR. He did have right rotator cuff surgery in March of this year which she is recovering from.   Current Outpatient Prescriptions  Medication Sig Dispense Refill  . aspirin EC 81 MG tablet Take 81 mg by mouth daily.    . clopidogrel (PLAVIX) 75 MG tablet TAKE 1 TABLET (75 MG TOTAL) BY MOUTH DAILY. 30 tablet 0  . clotrimazole-betamethasone (LOTRISONE) lotion Apply 1 application topically daily as needed (rash). 30 mL 2  . FEVERFEW PO Take 1 capsule by mouth daily. To prevent migraines    . insulin aspart (NOVOLOG) 100 UNIT/ML injection Inject up to 60 units a day in the insulin pump 90 mL 3  . Insulin Human (INSULIN PUMP) SOLN Inject into the skin continuous. Basal rate .95/hr, current pump settings: 12am .7, 2am 1, 7am 1.1, 11am .9, 5pm .9.25, 11pm .85. Uses Novolog Insulin.    [name] levothyroxine (SYNTHROID, LEVOTHROID) 137 MCG tablet Take 1 tablet (137 mcg total) by mouth daily. 90 tablet 1  . Multiple Vitamins-Minerals (ONE-A-DAY MENS 50+ ADVANTAGE) TABS Take 1 tablet by mouth daily with breakfast.    .  ranitidine (ZANTAC) 150 MG tablet Take 150 mg by mouth at bedtime as needed for heartburn.     . tadalafil (CIALIS) 20 MG tablet Take 1 tablet (20 mg total) by mouth daily as needed for erectile dysfunction. 15 tablet 6   No current facility-administered medications for this  visit.     Allergies  Allergen Reactions  . Rosuvastatin Calcium Other (See Comments)    Problem with higher dosages (Lipitor caused memory issues), also caused leg pains and lethargy  . Statins Other (See Comments)    Problem with higher dosages (Lipitor caused memory issues), also caused leg pains and lethargy  . Tape Rash and Other (See Comments)    Adhesive tape.  (Paper tape OK)    Social History   Social History  . Marital status: Married    Spouse name: N/A  . Number of children: N/A  . Years of education: N/A   Occupational History  . Not on file.   Social History Main Topics  . Smoking status: Former Games developer  . Smokeless tobacco: Former Neurosurgeon    Quit date: 06/22/1987  . Alcohol use 0.0 oz/week     Comment: occasional  . Drug use: No  . Sexual activity: Not on file   Other Topics Concern  . Not on file   Social History Narrative   Divorced x1, remarried.  Has 3 daughters from first marriage.   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   Distant tobacco abuse (35 pack-yr hx), quit in the late 1990s, no ETOH or drugs.   No exercise.     Review of Systems: General: negative for chills, fever, night sweats or weight changes.  Cardiovascular: negative for chest pain, dyspnea on exertion, edema, orthopnea, palpitations, paroxysmal nocturnal dyspnea or shortness of breath Dermatological: negative for rash Respiratory: negative for cough or wheezing Urologic: negative for hematuria Abdominal: negative for nausea, vomiting, diarrhea, bright red blood per rectum, melena, or hematemesis Neurologic: negative for visual changes, syncope, or dizziness All other systems reviewed and are otherwise negative except as noted above.    Blood pressure (!) 142/70, pulse 86, height 6\' 2"  (1.88 m), weight 224 lb 9.6 oz (101.9 kg).  General appearance: alert and no  distress Neck: no adenopathy, no carotid bruit, no JVD, supple, symmetrical, trachea midline and thyroid not enlarged, symmetric, no tenderness/mass/nodules Lungs: clear to auscultation bilaterally Heart: Soft outflow tract murmur Extremities: extremities normal, atraumatic, no cyanosis or edema  EKG sinus rhythm at 86 with left axis deviation/left anterior fascicular block and a nonspecific IVCD. I personally reviewed this EKG.  ASSESSMENT AND PLAN:   Aortic stenosis, moderate History of moderate aortic stenosis at the time of right and left heart cath 06/22/15 status post Medtronic Magna Ease 23 mm pericardial tissue valve aortic valve replacement, bypass surgery 07/09/15. Follow-up 2-D echo performed 08/19/15 revealed normally functioning aortic bioprosthesis.  Essential hypertension History of essential hypertension blood pressure is 122/70. He is on no antihypertensive medications.  Hyperlipidemia History of hyperlipidemia intolerant to statin drugs and Zetia. His LDLs have run in the 160s to 100 range. We will pursue initiating a PCSK9 monoclonal  Carotid stenosis, right History of right internal carotid artery stenosis status post carotid artery stenting by myself 01/16/14. His most recent carotid Doppler performed 06/29/15 revealed a widely patent stent. We will recheck this basis.  S/P CABG x 3 History of  coronary artery disease status post cardiac catheterization performed by myself 06/22/15 revealing three-vessel disease. He underwent coronary artery bypass grafting 3 on 07/09/15 with a LIMA to his LAD, vein to obtuse marginal branch 2 and the PDA. His postoperative course was unremarkable. He is completely symptomatic currently.      Runell GessJonathan J. Shaneta Cervenka MD FACP,FACC,FAHA, [facility] 06/17/2016 7:47 AM

## 2016-06-17 NOTE — Telephone Encounter (Signed)
Will initiate pre-approval prcess for Repatha. Patient unable to tolerate statins. Hx includes carotid stenosis and stroke with elevated LDL

## 2016-06-17 NOTE — Patient Instructions (Signed)
Medication Instructions: Your physician recommends that you continue on your current medications as directed. Please refer to the Current Medication list given to you today.  START Repatha  Labwork: Your physician recommends that you return for a FASTING lipid profile and hepatic function panel in 2 months--after starting Repatha.  Testing/Procedures: Your physician has requested that you have an echocardiogram. Echocardiography is a painless test that uses sound waves to create images of your heart. It provides your doctor with information about the size and shape of your heart and how well your heart's chambers and valves are working. This procedure takes approximately one hour. There are no restrictions for this procedure.  Your physician has requested that you have a carotid duplex. This test is an ultrasound of the carotid arteries in your neck. It looks at blood flow through these arteries that supply the brain with blood. Allow one hour for this exam. There are no restrictions or special instructions.  (Both in August)  Follow-Up: Your physician wants you to follow-up in: 1 year with Dr. Allyson SabalBerry. You will receive a reminder letter in the mail two months in advance. If you don't receive a letter, please call our office to schedule the follow-up appointment.  If you need a refill on your cardiac medications before your next appointment, please call your pharmacy.

## 2016-06-17 NOTE — Assessment & Plan Note (Signed)
History of essential hypertension blood pressure is 122/70. He is on no antihypertensive medications.

## 2016-06-17 NOTE — Assessment & Plan Note (Signed)
History of coronary artery disease status post cardiac catheterization performed by myself 06/22/15 revealing three-vessel disease. He underwent coronary artery bypass grafting 3 on 07/09/15 with a LIMA to his LAD, vein to obtuse marginal branch 2 and the PDA. His postoperative course was unremarkable. He is completely symptomatic currently.

## 2016-06-17 NOTE — Assessment & Plan Note (Signed)
History of hyperlipidemia intolerant to statin drugs and Zetia. His LDLs have run in the 160s to 100 range. We will pursue initiating a PCSK9 monoclonal

## 2016-06-17 NOTE — Assessment & Plan Note (Signed)
History of moderate aortic stenosis at the time of right and left heart cath 06/22/15 status post Medtronic Magna Ease 23 mm pericardial tissue valve aortic valve replacement, bypass surgery 07/09/15. Follow-up 2-D echo performed 08/19/15 revealed normally functioning aortic bioprosthesis.

## 2016-06-18 LAB — HEPATIC FUNCTION PANEL
ALBUMIN: 4.1 g/dL (ref 3.6–4.8)
ALT: 11 IU/L (ref 0–44)
AST: 16 IU/L (ref 0–40)
Alkaline Phosphatase: 106 IU/L (ref 39–117)
BILIRUBIN TOTAL: 0.6 mg/dL (ref 0.0–1.2)
BILIRUBIN, DIRECT: 0.14 mg/dL (ref 0.00–0.40)
Total Protein: 6.6 g/dL (ref 6.0–8.5)

## 2016-06-18 LAB — LIPID PANEL
CHOL/HDL RATIO: 4.2 ratio (ref 0.0–5.0)
CHOLESTEROL TOTAL: 245 mg/dL — AB (ref 100–199)
HDL: 58 mg/dL (ref >39)
LDL CALC: 164 mg/dL — AB (ref 0–99)
Triglycerides: 113 mg/dL (ref 0–149)
VLDL Cholesterol Cal: 23 mg/dL (ref 5–40)

## 2016-06-20 ENCOUNTER — Encounter: Payer: Self-pay | Admitting: Family Medicine

## 2016-06-20 ENCOUNTER — Telehealth: Payer: Self-pay | Admitting: *Deleted

## 2016-06-20 ENCOUNTER — Telehealth: Payer: Self-pay | Admitting: Cardiovascular Disease

## 2016-06-20 NOTE — Telephone Encounter (Signed)
Tried to schedule patient for Repatha appointment, but, patient could not talk. He stated he will call back.

## 2016-06-20 NOTE — Telephone Encounter (Signed)
Left message for patient to call and schedule [facility] appt ordered by Dr. Allyson SabalBerry

## 2016-06-21 ENCOUNTER — Telehealth: Payer: Self-pay | Admitting: Cardiovascular Disease

## 2016-06-21 NOTE — Telephone Encounter (Signed)
Called the patient and left a VM to call me back to schedule Repatha appointment.

## 2016-06-23 ENCOUNTER — Encounter: Payer: Self-pay | Admitting: Family Medicine

## 2016-06-28 ENCOUNTER — Telehealth: Payer: Self-pay | Admitting: Pharmacist

## 2016-06-28 NOTE — Telephone Encounter (Signed)
Repatha 140mg  [employer] initial approval request was denied 06/28/2016. Process of appeal initiated today

## 2016-06-30 ENCOUNTER — Ambulatory Visit: Payer: PRIVATE HEALTH INSURANCE

## 2016-07-02 ENCOUNTER — Other Ambulatory Visit: Payer: Self-pay | Admitting: Cardiovascular Disease

## 2016-07-14 ENCOUNTER — Telehealth: Payer: Self-pay | Admitting: Pharmacist

## 2016-07-14 NOTE — Telephone Encounter (Signed)
Appointment for PCSK9 inhibitor (Repatha) initiation not needed.   Patient already established with Lipid clinic for Repatha initiation and initial application denied. Currently working with appeal process.  Appointment for 07/21/2016 was cancelled by provider.

## 2016-07-21 ENCOUNTER — Ambulatory Visit: Payer: PRIVATE HEALTH INSURANCE

## 2016-07-27 ENCOUNTER — Telehealth: Payer: Self-pay | Admitting: Pharmacist

## 2016-07-27 NOTE — Telephone Encounter (Signed)
Medication Samples have been provided to the patient.  Drug name: Repatha      Strength: 140mg          Qty: 2    BMW:[id number] Exp.Date: 06/2018  Dosing instructions: 1 injection every 14 days  The patient has been instructed regarding the correct time, dose, and frequency of taking this medication, including desired effects and most common side effects.   Renel Ende N Rodriguez-Guzman 12:59 PM 07/27/2016

## 2016-07-28 ENCOUNTER — Encounter: Payer: Self-pay | Admitting: Family Medicine

## 2016-07-28 ENCOUNTER — Other Ambulatory Visit: Payer: Self-pay

## 2016-07-28 ENCOUNTER — Ambulatory Visit (HOSPITAL_COMMUNITY)
Admission: RE | Admit: 2016-07-28 | Discharge: 2016-07-28 | Disposition: A | Discharge status: Home / Self Care | Payer: PRIVATE HEALTH INSURANCE | Source: Ambulatory Visit | Attending: Cardiology | Admitting: Cardiology

## 2016-07-28 ENCOUNTER — Ambulatory Visit (HOSPITAL_BASED_OUTPATIENT_CLINIC_OR_DEPARTMENT_OTHER): Payer: PRIVATE HEALTH INSURANCE

## 2016-07-28 DIAGNOSIS — I350 Nonrheumatic aortic (valve) stenosis: Secondary | ICD-10-CM | POA: Diagnosis present

## 2016-07-28 DIAGNOSIS — I6521 Occlusion and stenosis of right carotid artery: Secondary | ICD-10-CM | POA: Diagnosis present

## 2016-07-28 DIAGNOSIS — I6523 Occlusion and stenosis of bilateral carotid arteries: Secondary | ICD-10-CM | POA: Diagnosis not present

## 2016-07-28 DIAGNOSIS — I5030 Unspecified diastolic (congestive) heart failure: Secondary | ICD-10-CM | POA: Diagnosis not present

## 2016-07-28 DIAGNOSIS — I088 Other rheumatic multiple valve diseases: Secondary | ICD-10-CM | POA: Diagnosis not present

## 2016-07-29 ENCOUNTER — Other Ambulatory Visit: Payer: Self-pay

## 2016-07-29 ENCOUNTER — Encounter: Payer: Self-pay | Admitting: Internal Medicine

## 2016-07-29 DIAGNOSIS — I350 Nonrheumatic aortic (valve) stenosis: Secondary | ICD-10-CM

## 2016-08-01 ENCOUNTER — Encounter: Payer: Self-pay | Admitting: Family Medicine

## 2016-08-01 ENCOUNTER — Other Ambulatory Visit: Payer: Self-pay

## 2016-08-01 MED ORDER — INSULIN LISPRO 100 UNIT/ML ~~LOC~~ SOLN: SUBCUTANEOUS | 2 refills | Status: AC

## 2016-08-02 ENCOUNTER — Other Ambulatory Visit: Payer: Self-pay | Admitting: Cardiovascular Disease

## 2016-08-02 DIAGNOSIS — I6521 Occlusion and stenosis of right carotid artery: Secondary | ICD-10-CM

## 2016-08-16 ENCOUNTER — Encounter: Payer: Self-pay | Admitting: Internal Medicine

## 2016-08-16 ENCOUNTER — Ambulatory Visit: Payer: PRIVATE HEALTH INSURANCE | Admitting: Internal Medicine

## 2016-08-24 ENCOUNTER — Encounter: Payer: Self-pay | Admitting: Internal Medicine

## 2016-08-25 ENCOUNTER — Encounter: Payer: Self-pay | Admitting: Family Medicine

## 2016-08-25 MED ORDER — CLOTRIMAZOLE-BETAMETHASONE 1-0.05 % EX CREA: 1.0000 "application " | TOPICAL_CREAM | Freq: Two times a day (BID) | CUTANEOUS | 0 refills | Status: DC

## 2016-08-25 NOTE — Telephone Encounter (Signed)
Clotrimazole-betamethasone cream eRx'd as per pt request.

## 2016-08-25 NOTE — Telephone Encounter (Signed)
Please advise. Thanks.  

## 2016-10-04 ENCOUNTER — Ambulatory Visit: Payer: PRIVATE HEALTH INSURANCE | Admitting: Internal Medicine

## 2016-11-21 ENCOUNTER — Encounter: Payer: Self-pay | Admitting: Cardiovascular Disease

## 2016-12-01 ENCOUNTER — Telehealth: Payer: Self-pay | Admitting: Cardiovascular Disease

## 2016-12-01 NOTE — Telephone Encounter (Signed)
From: London Pepperichard L Ivan  Sent: 11/21/2016 10:28 PM  To: Cv Div Nl Clinical Pool  Subject: Non-Urgent Medical Question             ----- Message from Mychart, Generic sent at 11/21/2016 10:28 PM EST -----    My insurance has changed at work and I have to choose providers that are in the [facility] system. I don't suppose you also work within that system? Probably not. What I was hoping you could do for me is to refer me to someone who is as good as you that practices in the [facility] system that would take care of me as good as you do. I don't want to leave your care but I can't afford to stay with you with the change insurance is forcing me to make. Would you be able to help me with a referral. By the way.[name].[name].I've been doing the Repatha for a while now and it would be good to know my numbers. Thank you Dr. Allyson SabalBerry. You have given me a change of heart and I believe that you saved my life.[name].[name].[name].    Pt requesting cardiology recommendation in [facility] if any. Also asking when he needs repeat lipid/liver to check cholesterol levels. Last check was in June.

## 2016-12-28 ENCOUNTER — Ambulatory Visit: Payer: PRIVATE HEALTH INSURANCE | Admitting: Internal Medicine

## 2016-12-30 ENCOUNTER — Telehealth: Payer: Self-pay | Admitting: Pharmacist

## 2016-12-30 DIAGNOSIS — E785 Hyperlipidemia, unspecified: Secondary | ICD-10-CM

## 2016-12-30 NOTE — Telephone Encounter (Signed)
Due to repeat Lipid panel and LFTs. Order in epic. Patient will repeat labs on 01/02/2017

## 2017-01-02 LAB — LIPID PANEL
CHOL/HDL RATIO: 2.7 ratio (ref 0.0–5.0)
Cholesterol, Total: 202 mg/dL — ABNORMAL HIGH (ref 100–199)
HDL: 75 mg/dL (ref >39)
LDL CALC: 113 mg/dL — AB (ref 0–99)
TRIGLYCERIDES: 70 mg/dL (ref 0–149)
VLDL Cholesterol Cal: 14 mg/dL (ref 5–40)

## 2017-01-02 LAB — HEPATIC FUNCTION PANEL
ALBUMIN: 4.2 g/dL (ref 3.6–4.8)
ALK PHOS: 98 IU/L (ref 39–117)
ALT: 21 IU/L (ref 0–44)
AST: 24 IU/L (ref 0–40)
BILIRUBIN TOTAL: 0.5 mg/dL (ref 0.0–1.2)
BILIRUBIN, DIRECT: 0.16 mg/dL (ref 0.00–0.40)
Total Protein: 6.9 g/dL (ref 6.0–8.5)

## 2017-01-04 ENCOUNTER — Encounter: Payer: Self-pay | Admitting: Family Medicine

## 2017-01-05 ENCOUNTER — Telehealth: Payer: Self-pay | Admitting: Cardiovascular Disease

## 2017-01-05 NOTE — Telephone Encounter (Signed)
Follow up   Patient returned call. Per Epic note lipid clinic appt needed. Please call patient is further direction was needed.

## 2017-01-05 NOTE — Telephone Encounter (Signed)
Called patient and LVM to call back and schedule his lipid clinic visit with Dr. Rennis GoldenHilty.

## 2017-01-17 ENCOUNTER — Encounter: Payer: Self-pay | Admitting: Internal Medicine

## 2017-01-17 ENCOUNTER — Ambulatory Visit: Payer: PRIVATE HEALTH INSURANCE | Admitting: Internal Medicine

## 2017-01-17 VITALS — BP 142/74 | HR 70 | Ht 74.0 in | Wt 229.0 lb

## 2017-01-17 DIAGNOSIS — Z789 Other specified health status: Secondary | ICD-10-CM

## 2017-01-17 DIAGNOSIS — I739 Peripheral vascular disease, unspecified: Secondary | ICD-10-CM | POA: Diagnosis not present

## 2017-01-17 DIAGNOSIS — E785 Hyperlipidemia, unspecified: Secondary | ICD-10-CM

## 2017-01-17 DIAGNOSIS — Z951 Presence of aortocoronary bypass graft: Secondary | ICD-10-CM

## 2017-01-17 NOTE — Patient Instructions (Addendum)
Dr. Rennis GoldenHilty has recommended that your Repatha be changed from the pre-filled syringes (every 14 days) to the Pushtronex system (administered every 30 days). A prior authorization with your insurance company will need to be completed. We will notify you of the outcome.  - sample of Repatha Pushtronex provided to patient - patient assistance paperwork began in office  Dr. Rennis GoldenHilty has recommended that you have FASTING blood work in 3-4 months to recheck your cholesterol.   Dr. Rennis GoldenHilty has recommended that you follow up with him at his next LIPID CLINIC appointment day in May.

## 2017-01-18 ENCOUNTER — Encounter: Payer: Self-pay | Admitting: Internal Medicine

## 2017-01-18 DIAGNOSIS — Z789 Other specified health status: Secondary | ICD-10-CM | POA: Insufficient documentation

## 2017-01-18 NOTE — Progress Notes (Signed)
OFFICE NOTE  Chief Complaint:  Lipid clinic evaluation  Primary Care Physician: [name], MD  HPI:  [name] is a 67 y.o. 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  He also underwent bovine pericardial aortic valve replacement at that time.  In addition he has a history of dyslipidemia with statin intolerance.  He reports taking high-dose atorvastatin and rosuvastatin which caused both myalgias, lethargy and memory problems.  In addition he said he took ezetimibe in the past which she thought also cause some memory problems.  He was referred for evaluation of PCSK9 inhibitor.  Actually he was recently started on Repatha and has had 5 doses of the medication.  Lipid profile 7 months ago indicated total cholesterol 245, triglycerides 113, HDL 58 and LDL 164.  His most recent lipid profile 2 weeks ago showed a total cholesterol 202, triglycerides 70, HDL 75 and LDL 113.  There is concerned about incomplete response to Repatha, but it should be noted that his calculated LDL was lower, but is misrepresented as his HDL fraction has gone up from 58-75.  That being said his actual LDL is probably higher than represented.  He does report compliance with the medication but he says the injector pen does cause him discomfort.  He also uses an insulin pump and is fairly used to needles.  PMHx:  Past Medical History:  Diagnosis Date  . Abnormal stress test 10/27/2011   lat isch--cardiac cath 10/28/2011  . Aortic stenosis, moderate Sept/Oct /2013   a. Mild/mod by echo; mild by cath 10/28/11; b. Mild progression by echo 05/2012; c. Further mild progression on 01/2014 echo (valve area 0.93 cm2).  d. Further progression on echo 01/2015; e. 07/2015 s/p [facility] Ease bovine pericardial valve model 3300 TFX, ser # [id number].  . 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 Inferolateral perfusion  defect + EKG abnormality during stress testing prompted Cath by [facility]: 3V CAD, EF normal.  Med mgmt recommended; b. 06/2015 Cath: 3VD and mod AS; c. 07/2015 CABG x 3 (LIMA->LAD, VG->OM2, VG->PDA) w/ AVR.  [name] Carotid stenosis, right 09/2011   Dr. [name] did R carotid stenting 01/2014.  Carotid dopplers 06/2015 essentially normal.  No change 07/2016--repeat 1 yr.  . Complication of anesthesia    difficulty with intubation,   . Decreased pedal pulses    LE doppler 11/08/11- no evidence of arterial insufficiency  . Diabetes mellitus Dx'd age 26   Novolog via insulin pump; sub-optimal control x years  . Diabetic neuropathy (HCC)    fine touch and position sense affected  . Diabetic retinopathy    Proliferative: Hx of retinal detachment on right-light perception only in right eye  . Diastolic dysfunction 2007; 2016   TEE with diastolic dysfunction, EF 74%.  . Difficult intubation    ~ 2002 difficult fiberoptic intubation with takeback bleeding s/p thyroidectomy;  for thyroidectomy was intubated DL X 1 with cricoid pressure but difficult mask (full beard)  . Dizziness    feels off balance  . Erectile dysfunction    [place]rolog[place]  . GERD (gastroesophageal reflux disease)   . High cholesterol    a. intolerant of statins/zeta; per Dr. [name] 12/2016, pt needs PCSK9.  [name] History of tobacco abuse    Quit about 1990 (has 35 pack-yr hx)  . Hypothyroidism, postsurgical    Thyroidectomy  2002; multinodular goiter.  Dr. Elvera Lennox managing this as of 10/2015.  . Impaired vision    right eye light perception only; hx of retinal detachment.  . Impingement syndrome of right shoulder    08/26/15 Pt got subacromial steroid injection by orthopedist (Dr. Delfin Edis).  [facility] f/u 01/2016--MRI showed RC tear + AC joint arthritis, arthroscopic surgery done 03/2016 ([facility]).  . Neuromuscular disorder (HCC)    neuropathy  . OSA (obstructive sleep apnea)    06/2011 sleep study: moderate OSA, CPAP at  12 cm H2O.  . Osteoarthritis    Bilat thumb carpometacarpal joints.  Ortho injected each thumb x 2 in 2017.  [name] Recurrent pneumonia   . Shortness of breath dyspnea   . Stroke [facility])    TIA  . TIA (transient ischemic attack) 2015/16   with R carotid dz: pt got R carotid stent 01/2014 by Dr. [name] and was put on dual antiplatelet therapy with ASA 325mg  and plavix 75mg  qd afterwards  . TIA (transient ischemic attack) 05/2015   Left brain (right sided numbness + slurred speech)  Admitted for obs/workup 05/2015.  [name] Venous insufficiency   . Venous reflux 10/14/2011   venous doppler-R GSV continuous reflux throughout; too small for VNUS closure    Past Surgical History:  Procedure Laterality Date  . ABI  01/2016   Normal.  Dr. [name] to repeat in 1 yr.  . AORTIC VALVE REPLACEMENT N/A 07/09/2015   Bovine pericardial valve.  Procedure: AORTIC VALVE REPLACEMENT (AVR);  Surgeon: Loreli Slot, MD;  Location[facility] OR;  Service: Open Heart Surgery;  Laterality: N/A;  . CARDIAC CATHETERIZATION  10/2011   EF normal.  Diffuse 3 vessel CAD, no stents placed.  Medical mgmt per [facility].  [name] CARDIAC CATHETERIZATION N/A 06/22/2015   Procedure: Right/Left Heart Cath and Coronary Angiography;  Surgeon: Runell Gess, MD;  Location: [facility] INVASIVE CV LAB;  Service: Cardiovascular;  Laterality: N/A;  . carotid dopplers  06/30/15   Normal: repeat when clinically indicated per Dr. [name]  . CAROTID STENT INSERTION Right 01/16/2014   Procedure: CAROTID STENT INSERTION;  Surgeon: Runell Gess, MD;  Location: [facility] CATH LAB;  Service: Cardiovascular;  Laterality: Right;  . CATARACT EXTRACTION     left  . CORONARY ARTERY BYPASS GRAFT N/A 07/09/2015   Procedure: CORONARY ARTERY BYPASS GRAFTING (CABG)TIMES THREE USING LEFT INTERNAL MAMMARY ARTERY AND LEFT SAPHENOUS LEG VEIN HARVESTED ENDOSCOPICALLY;  Surgeon: Loreli Slot, MD;  Location: [facility] OR;  Service: Open Heart Surgery;  Laterality: N/A;  . EYE SURGERY      Multiple laser surgeries for diabetic retinopathy, also cataract surgery OU.  [name] INTRAOPERATIVE TRANSESOPHAGEAL ECHOCARDIOGRAM N/A 07/09/2015   Procedure: INTRAOPERATIVE TRANSESOPHAGEAL ECHOCARDIOGRAM;  Surgeon: Loreli Slot, MD;  Location: [facility] OR;  Service: Open Heart Surgery;  Laterality: N/A;  . lasix eye    . LEFT AND RIGHT HEART CATHETERIZATION WITH CORONARY ANGIOGRAM N/A 10/28/2011   Procedure: LEFT AND R0IHT HEART CATHETERIZATION WITH CORONARY ANGIOGRAM;  Surgeon: Lennette Bihari, MD;  Location: [facility] CATH LAB;  Service: Cardiovascular;  Laterality: N/A;  . RETINAL DETACHMENT SURGERY     Right eye  . ROTATOR CUFF REPAIR W/ DISTAL CLAVICLE EXCISION Right 03/2016   Arthroscopic ([facility])  . THYROID SURGERY  2002  . TRANSTHORACIC ECHOCARDIOGRAM  10/2011;12/2012;01/2015; 08/2015; 07/28/16   Mod AS, mild LVH, EF 60-65%, no RWMA.  01/2015 EF 60-65%, grade I DD, progression of mod/sev AS.  08/2015 normal lv fxn with normal  fxning bioprosthetic Ao valve.  07/2016--no change compared to 2017 echo.      FAMHx:  Family History  Problem Relation Age of Onset  . Cancer Mother        lung cancer  . Alcohol abuse Father   . Cancer Father        laryngeal cancer  . Cancer Brother        oldest brother had lung cancer and melanoma    SOCHx:   reports that he has quit smoking. He quit smokeless tobacco use about 29 years ago. He reports that he drinks alcohol. He reports that he does not use drugs.  ALLERGIES:  Allergies  Allergen Reactions  . Rosuvastatin Calcium Other (See Comments)    Problem with higher dosages (Lipitor caused memory issues), also caused leg pains and lethargy  . Statins Other (See Comments)    Problem with higher dosages (Lipitor caused memory issues), also caused leg pains and lethargy  . Tape Rash and Other (See Comments)    Adhesive tape.  (Paper tape OK)    ROS: Pertinent items noted in HPI and remainder of comprehensive ROS otherwise negative.  HOME  MEDS: Current Outpatient Medications on File Prior to Visit  Medication Sig Dispense Refill  . aspirin EC 81 MG tablet Take 81 mg by mouth daily.    . clopidogrel (PLAVIX) 75 MG tablet TAKE 1 TABLET BY MOUTH EVERY DAY 90 tablet 3  . clotrimazole-betamethasone (LOTRISONE) cream Apply 1 application topically 2 (two) times daily. 30 g 0  . Evolocumab with Infusor (REPATHA PUSHTRONEX SYSTEM) 420 MG/3.5ML SOCT Inject 1 Dose into the skin every 30 (thirty) days.    [name]. FEVERFEW PO Take 1 capsule by mouth daily. To prevent migraines    . Insulin Human (INSULIN PUMP) SOLN Inject into the skin continuous. Basal rate .95/hr, current pump settings: 12am .7, 2am 1, 7am 1.1, 11am .9, 5pm .9.25, 11pm .85. Uses Novolog Insulin.    [name]. insulin lispro (HUMALOG) 100 UNIT/ML injection Use 60 units via insulin pump. 90 mL 2  . levothyroxine (SYNTHROID, LEVOTHROID) 137 MCG tablet Take 1 tablet (137 mcg total) by mouth daily. 90 tablet 1  . Multiple Vitamins-Minerals (ONE-A-DAY MENS 50+ ADVANTAGE) TABS Take 1 tablet by mouth daily with breakfast.    . ranitidine (ZANTAC) 150 MG tablet Take 150 mg by mouth at bedtime as needed for heartburn.     . tadalafil (CIALIS) 20 MG tablet Take 1 tablet (20 mg total) by mouth daily as needed for erectile dysfunction. 15 tablet 6   No current facility-administered medications on file prior to visit.     LABS/IMAGING: No results found for this or any previous visit (from the past 48 hour(s)). No results found.  LIPID PANEL:    Component Value Date/Time   CHOL 202 (H) 01/02/2017 0811   TRIG 70 01/02/2017 0811   HDL 75 01/02/2017 0811   CHOLHDL 2.7 01/02/2017 0811   CHOLHDL 4.0 05/07/2015 0557   VLDL 15 05/07/2015 0557   LDLCALC 113 (H) 01/02/2017 0811   LDLDIRECT 159.5 04/21/2011 0843     WEIGHTS: Wt Readings from Last 3 Encounters:  01/17/17 229 lb (103.9 kg)  06/17/16 224 lb 9.6 oz (101.9 kg)  02/18/16 225 lb 8 oz (102.3 kg)    VITALS: BP (!) 142/74   Pulse 70    Ht 6\' 2"  (1.88 m)   Wt 229 lb (103.9 kg)   BMI 29.40 kg/m   EXAM: General appearance: alert and  no distress Neck: no carotid bruit, no JVD, thyroid not enlarged, symmetric, no tenderness/mass/nodules and No corneal arcus Lungs: clear to auscultation bilaterally Heart: regular rate and rhythm Abdomen: soft, non-tender; bowel sounds normal; no masses,  no organomegaly Extremities: extremities normal, atraumatic, no cyanosis or edema and No tendinous xanthomas Pulses: 2+ and symmetric Skin: Skin color, texture, turgor normal. No rashes or lesions Neurologic: Grossly normal Psych: Pleasant  EKG: Deferred  ASSESSMENT: 1. Mixed dyslipidemia 2. ASCVD status post 3 vessel CABG and bioprosthetic AVR (2017) 3. Statin intolerance 4. Incomplete responder to PCSK9  PLAN: 1.   [name] mixed dyslipidemia and has been an incomplete responder to PCSK9.  Recently after starting the PCSK9 inhibitor his HDL is actually gone up.  Calculated LDL was lower, but is actually proportionately higher.  There is not been a predictable 60-65% reduction in his LDL, suggesting there could be factors such as LDL receptor mutation or PCSK9 nutation that is affecting his response to the antibody.  Is not clear whether this would be overcome perhaps by elevated dosing.  He also is reporting some discomfort with the needles during his 2-week injections.  He currently uses an insulin pump which he says works well and I think he would be a good candidate for the Pushtronex system.  We demonstrated that to him today and will apply for that going forward.  Hopefully there will be no significant cost differences.  We will repeat a lipid NMR to assess his particle number in 3-4 months.  Follow-up with me at that time.  We did discuss other options including using low-dose rosuvastatin every other day or once weekly, considering a retrial with ezetimibe as well.  TIME SPENT WITH PATIENT: 40 minutes of direct patient care.  More than 50% of that time was spent on coordination of care and counseling regarding statin intolerance, dietary/nutrition plan, teaching of the pushtronex injection system and extensive chart review.  Chrystie Nose, MD, [facility], FACP  [place]  [facility]  Medical Director of the Advanced Lipid Disorders &  Cardiovascular Risk Reduction Clinic Diplomate of the American Board of Clinical Lipidology Attending Cardiologist  Direct Dial: [phone]  Fax: [phone]  Website:  [email or web address] Nicely [email or web address] 01/18/2017, 11:01 AM

## 2017-01-20 ENCOUNTER — Telehealth: Payer: Self-pay | Admitting: Internal Medicine

## 2017-01-20 NOTE — Telephone Encounter (Signed)
Theodore Demarkichard Bassford [facility] - [id number] Repatha Pushtronex System 420MG /3.5ML on-body infusor Status: PA Request submitted Created: January 18th, 2019 Sent: January 18th, 2019

## 2017-01-23 MED ORDER — EVOLOCUMAB WITH INFUSOR 420 MG/3.5ML ~~LOC~~ SOCT: 1.0000 | SUBCUTANEOUS | 3 refills | Status: DC

## 2017-01-23 NOTE — Addendum Note (Signed)
Addended by: Lindell SparELKINS, JENNA M on: 01/23/2017 03:09 PM   Modules accepted: Orders

## 2017-01-23 NOTE — Telephone Encounter (Signed)
Patient called and notified that med has been approved. He needs this Rx sent to [facility] pharmacy. Rx(s) sent to pharmacy electronically. Advised that he notify our office about the cost to determine if this is affordable/if patient assistance will need to be submitted.

## 2017-01-23 NOTE — Telephone Encounter (Signed)
Theodore Demarkichard Enlow ([facility] [id number]- [id number] Repatha Pushtronex System 420MG /3.5ML on-body infusor Status: PA Response - Approved Created: January 18th, 2019 Sent: January 18th, 2019

## 2017-01-30 ENCOUNTER — Encounter: Payer: Self-pay | Admitting: *Deleted

## 2017-01-31 NOTE — Telephone Encounter (Signed)
Called patient's pharmacy to inquire about cost of Repatha. Their system is showing that a prior authorization is needed. Explained that one has been completed and approved. Confirmed with pharmacy that we have the same insurance info on file as well.

## 2017-02-01 ENCOUNTER — Encounter: Payer: Self-pay | Admitting: Family Medicine

## 2017-02-01 NOTE — Telephone Encounter (Signed)
Repatha patient assistance application ([employer]) faxed to [phone] 

## 2017-02-01 NOTE — Telephone Encounter (Signed)
Per [facility] pharmacists in house, it is suggested that pharmacy run Rx with NDC: [id number].   Called [name] and his Rx went thru for a cost of $139.90/month.   Patient notified via MyChart message

## 2017-02-04 ENCOUNTER — Other Ambulatory Visit: Payer: Self-pay | Admitting: Internal Medicine

## 2017-02-06 NOTE — Telephone Encounter (Signed)
[facility]. Sent 90 days so that he has time to find a new doctor

## 2017-02-06 NOTE — Telephone Encounter (Signed)
He  was lost for f/u. Please check with him whether he is coming back to see me. I seem to remember that he [facility]. If so, we can refill it for 3 mo until he gets to see endo there.

## 2017-02-06 NOTE — Telephone Encounter (Signed)
Is this okay to refill? 

## 2017-02-16 ENCOUNTER — Telehealth: Payer: Self-pay | Admitting: Internal Medicine

## 2017-02-16 NOTE — Telephone Encounter (Signed)
Returned call to patient who states he was calling to follow up on Repatha assistance paperwork.  Patient reports he has not heard anything from this submission on 1/30.  Spoke to nurse who states she had to resubmit this week due to missing information on patient portion of application.   Also states she will check with pharmD to see if they have any samples for him.    Patient is aware and states nurse can let him know about samples via Mychart message.

## 2017-02-16 NOTE — Telephone Encounter (Signed)
New message ° °Pt verbalized that he is calling for the RN °

## 2017-02-22 NOTE — Telephone Encounter (Signed)
Called [employer] patient assistance foundation to inquire about status of application. Per [employer], they didn't received updated application with missing questions answered that were faxed last week and they also need a copy of the coverage approval (PA) letter form insurance company. Explained that this was not stated as being a necessary item on the application and I was informed this was a new requirement as of 2019. Re-faxed complete application to [employer] @ [phone]

## 2017-03-27 ENCOUNTER — Encounter: Payer: Self-pay | Admitting: Family Medicine

## 2017-04-04 ENCOUNTER — Telehealth: Payer: Self-pay | Admitting: Internal Medicine

## 2017-04-04 NOTE — Telephone Encounter (Signed)
I understand.  The referral has to come from his PCP, though.

## 2017-04-04 NOTE — Telephone Encounter (Signed)
Spoke to patient. Advised no referral should be needed from specialist to specialist. New office should just request medical records forwarded. Patient verbalized understanding.

## 2017-04-04 NOTE — Telephone Encounter (Signed)
Patient is calling because he need a referral to go to see [facility],  [facility] stated he has to have a referral in order to be seen there, that's there policy. He is asking Dr Elvera LennoxGherghe will please send a referral, he has to do this because of his insurance, not because he wants to.   please advise

## 2017-04-04 NOTE — Telephone Encounter (Signed)
Ok thanks 

## 2017-04-04 NOTE — Telephone Encounter (Signed)
Due to change in insurance Patient is unable to see [facility] and must use [facility]. Patient is requesting Dr. Elvera LennoxGherghe give him a referral to the Endocrinology Practice on [address] [place] Fax# [phone] ATTN: Glee ArvinLaToya

## 2017-04-05 ENCOUNTER — Telehealth: Payer: Self-pay | Admitting: *Deleted

## 2017-04-05 DIAGNOSIS — Z794 Long term (current) use of insulin: Secondary | ICD-10-CM

## 2017-04-05 DIAGNOSIS — E138 Other specified diabetes mellitus with unspecified complications: Secondary | ICD-10-CM

## 2017-04-05 NOTE — Telephone Encounter (Signed)
Referral ordered.   Pt advised that someone will be contacting him to schedule an apt.

## 2017-04-05 NOTE — Telephone Encounter (Signed)
Order placed, need Dx. Thanks.

## 2017-04-05 NOTE — Telephone Encounter (Signed)
Spoke to patient. Gave instructions. Patient verbalized understanding.  

## 2017-04-05 NOTE — Telephone Encounter (Signed)
Dx is diabetes with complications, with current long term use of insulin.-thx

## 2017-04-05 NOTE — Telephone Encounter (Signed)
Copied from CRM [phone]. Topic: Referral - Request >> Apr 05, 2017  9:07 AM Arlyss Gandyichardson, Taren N, NT wrote: Reason for CRM: Pt needing a referral sent to [facility] due to his insurance is now requiring him to see [facility].

## 2017-04-12 DIAGNOSIS — E890 Postprocedural hypothyroidism: Secondary | ICD-10-CM | POA: Insufficient documentation

## 2017-05-10 ENCOUNTER — Telehealth: Payer: Self-pay | Admitting: Internal Medicine

## 2017-05-10 NOTE — Telephone Encounter (Signed)
LM with appointment date/time info and a reminder to ensure that fasting labs to reassess cholesterol was/will be checked prior to OV per AVS from last visit.

## 2017-05-11 ENCOUNTER — Ambulatory Visit: Payer: Self-pay | Admitting: Internal Medicine

## 2017-05-11 ENCOUNTER — Telehealth: Payer: Self-pay | Admitting: Internal Medicine

## 2017-05-11 NOTE — Telephone Encounter (Signed)
New Message:      Pt is wanting to know how many days before his appt on 7/3 will he need to do the lipid labs for Dr. To have results for appt

## 2017-05-11 NOTE — Telephone Encounter (Signed)
Left message to call back  

## 2017-05-11 NOTE — Telephone Encounter (Signed)
Spoke with pt, aware he will need to give a good 2 days for the results to be available at appointment time.

## 2017-06-13 DIAGNOSIS — Z5320 Procedure and treatment not carried out because of patient's decision for unspecified reasons: Secondary | ICD-10-CM | POA: Insufficient documentation

## 2017-06-28 ENCOUNTER — Encounter: Payer: Self-pay | Admitting: *Deleted

## 2017-07-05 ENCOUNTER — Ambulatory Visit: Payer: PRIVATE HEALTH INSURANCE | Admitting: Internal Medicine

## 2017-07-31 ENCOUNTER — Other Ambulatory Visit (HOSPITAL_COMMUNITY): Payer: Self-pay

## 2017-11-28 ENCOUNTER — Telehealth: Payer: Self-pay

## 2017-11-28 NOTE — Telephone Encounter (Signed)
Forms for [employer] filled out, signed by Dr. Elvera LennoxGherghe and faxed to [employer] with confirmation.

## 2017-11-29 ENCOUNTER — Telehealth: Payer: Self-pay | Admitting: Internal Medicine

## 2017-11-29 NOTE — Telephone Encounter (Signed)
Mailed patient repatha patient assistance application to be completed with instructions to return for submission to [employer]. Patient has authorization with insurance company until Jan 30, 2018

## 2017-12-15 NOTE — Telephone Encounter (Signed)
Faxed Repatha patient assistance application to [employer] @ [phone] for 2020 with copy of PA letter for medication approval that is good until Jan 30, 2018

## 2017-12-22 NOTE — Telephone Encounter (Signed)
Received fax notice from [employer] that patient has been approved for Repatha free of charge from 01/03/2018 - 01/03/2019.  

## 2018-01-09 ENCOUNTER — Telehealth: Payer: Self-pay | Admitting: Internal Medicine

## 2018-01-09 DIAGNOSIS — E785 Hyperlipidemia, unspecified: Secondary | ICD-10-CM

## 2018-01-09 DIAGNOSIS — Z951 Presence of aortocoronary bypass graft: Secondary | ICD-10-CM

## 2018-01-09 NOTE — Telephone Encounter (Signed)
LM for patient that he is due for LIPID CLINIC visit with Dr. Rennis Golden. Advised he call our office to schedule. Lipid panel ordered to be done prior to visit & patient was notified of this via voicemail.

## 2018-01-12 LAB — LIPID PANEL
CHOLESTEROL TOTAL: 176 mg/dL (ref 100–199)
Chol/HDL Ratio: 2.4 ratio (ref 0.0–5.0)
HDL: 72 mg/dL (ref >39)
LDL CALC: 94 mg/dL (ref 0–99)
Triglycerides: 52 mg/dL (ref 0–149)
VLDL Cholesterol Cal: 10 mg/dL (ref 5–40)

## 2018-02-07 ENCOUNTER — Ambulatory Visit: Payer: PRIVATE HEALTH INSURANCE | Admitting: Internal Medicine

## 2018-02-07 ENCOUNTER — Encounter: Payer: Self-pay | Admitting: Internal Medicine

## 2018-02-07 VITALS — BP 150/80 | HR 76 | Ht 74.0 in | Wt 234.0 lb

## 2018-02-07 DIAGNOSIS — Z951 Presence of aortocoronary bypass graft: Secondary | ICD-10-CM

## 2018-02-07 DIAGNOSIS — Z789 Other specified health status: Secondary | ICD-10-CM | POA: Diagnosis not present

## 2018-02-07 DIAGNOSIS — I739 Peripheral vascular disease, unspecified: Secondary | ICD-10-CM

## 2018-02-07 DIAGNOSIS — E785 Hyperlipidemia, unspecified: Secondary | ICD-10-CM | POA: Diagnosis not present

## 2018-02-07 DIAGNOSIS — I350 Nonrheumatic aortic (valve) stenosis: Secondary | ICD-10-CM

## 2018-02-07 NOTE — Progress Notes (Signed)
OFFICE NOTE  Chief Complaint:  Lipid clinic evaluation  Primary Care Physician: [name], MD  HPI:  [name] is a 68 y.o. 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  He also underwent bovine pericardial aortic valve replacement at that time.  In addition he has a history of dyslipidemia with statin intolerance.  He reports taking high-dose atorvastatin and rosuvastatin which caused both myalgias, lethargy and memory problems.  In addition he said he took ezetimibe in the past which she thought also cause some memory problems.  He was referred for evaluation of PCSK9 inhibitor.  Actually he was recently started on Repatha and has had 5 doses of the medication.  Lipid profile 7 months ago indicated total cholesterol 245, triglycerides 113, HDL 58 and LDL 164.  His most recent lipid profile 2 weeks ago showed a total cholesterol 202, triglycerides 70, HDL 75 and LDL 113.  There is concerned about incomplete response to Repatha, but it should be noted that his calculated LDL was lower, but is misrepresented as his HDL fraction has gone up from 58-75.  That being said his actual LDL is probably higher than represented.  He does report compliance with the medication but he says the injector pen does cause him discomfort.  He also uses an insulin pump and is fairly used to needles.  02/07/2018  [name] is seen today in follow-up.  He is done well with the Repatha Pushtronex system.  He is now using that once monthly.  He does get some occasional injection site redness with use but typically that improves after 2 days.  His cholesterol profile is also favorably responded to the medication.  His total cholesterol now as of January 12, 2018 is 176, HDL 72, LDL 94 and triglycerides 52.  He denies any other myalgias or other side effects that he had with statins.  He is actually quite  close to goal LDL less than 70.  Interestingly, he has not had a robust response as expected with the Repatha, namely a 60 to 65% reduction although does seem to have a little more benefit on the high once monthly dose.  He previously had been on the every 2 weekly Repatha dose with suboptimal response, suggesting that there may be PCSK9 or LDL mutation or perhaps a large fraction of his LDL is LP(a).  PMHx:  Past Medical History:  Diagnosis Date  . Abnormal stress test 10/27/2011   lat isch--cardiac cath 10/28/2011  . Aortic stenosis, moderate Sept/Oct /2013   a. Mild/mod by echo; mild by cath 10/28/11; b. Mild progression by echo 05/2012; c. Further mild progression on 01/2014 echo (valve area 0.93 cm2).  d. Further progression on echo 01/2015; e. 07/2015 s/p [facility] Ease bovine pericardial valve model 3300 TFX, ser # [id number].  . 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 Inferolateral perfusion defect + EKG abnormality during stress testing prompted Cath by [facility]: 3V CAD, EF normal.  Med mgmt recommended; b. 06/2015 Cath: 3VD and mod AS; c. 07/2015 CABG x 3 (LIMA->LAD, VG->OM2, VG->PDA) w/ AVR.  [name] Carotid stenosis, right 09/2011   Dr. [name] did R carotid stenting 01/2014.  Carotid dopplers 06/2015 essentially normal.  No change 07/2016--repeat 1 yr.  . Complication of anesthesia    difficulty with intubation,   . Decreased pedal pulses    LE doppler  11/08/11- no evidence of arterial insufficiency  . Diabetes mellitus Dx'd age 67   Novolog via insulin pump; sub-optimal control x years  . Diabetic neuropathy (HCC)    fine touch and position sense affected  . Diabetic retinopathy    Proliferative: Hx of retinal detachment on right-light perception only in right eye  . Diastolic dysfunction 2007; 2016   TEE with diastolic dysfunction, EF 74%.  . Difficult intubation    ~ 2002 difficult fiberoptic intubation with takeback bleeding s/p thyroidectomy;  for thyroidectomy was intubated DL X 1 with cricoid  pressure but difficult mask (full beard)  . Dizziness    feels off balance  . Erectile dysfunction    [place]rolog[place]  . GERD (gastroesophageal reflux disease)   . History of tobacco abuse    Quit about 1990 (has 35 pack-yr hx)  . Hyperlipemia, mixed    a. intolerant of statins/zeta; per Dr. [name] 12/2016, pt started on PCSK9--incomplete responder?---01/2017.  [name] Hypothyroidism, postsurgical    Thyroidectomy 2002; multinodular goiter.  Dr. Elvera Lennox managing this as of 10/2015.  . Impaired vision    right eye light perception only; hx of retinal detachment.  . Impingement syndrome of right shoulder    08/26/15 Pt got subacromial steroid injection by orthopedist (Dr. Delfin Edis).  [facility] f/u 01/2016--MRI showed RC tear + AC joint arthritis, arthroscopic surgery done 03/2016 (ortho-).  . Neuromuscular disorder (HCC)    neuropathy  . OSA (obstructive sleep apnea)    06/2011 sleep study: moderate OSA, CPAP at 12 cm H2O.  . Osteoarthritis    Bilat thumb carpometacarpal joints.  Ortho injected each thumb x 2 in 2017.  Also injected 03/14/17.  [name] Recurrent pneumonia   . Shortness of breath dyspnea   . Stroke [facility])    TIA  . TIA (transient ischemic attack) 2015/16   with R carotid dz: pt got R carotid stent 01/2014 by Dr. [name] and was put on dual antiplatelet therapy with ASA 325mg  and plavix 75mg  qd afterwards  . TIA (transient ischemic attack) 05/2015   Left brain (right sided numbness + slurred speech)  Admitted for obs/workup 05/2015.  [name] Venous insufficiency   . Venous reflux 10/14/2011   venous doppler-R GSV continuous reflux throughout; too small for VNUS closure    Past Surgical History:  Procedure Laterality Date  . ABI  01/2016   Normal.  Dr. [name] to repeat in 1 yr.  . AORTIC VALVE REPLACEMENT N/A 07/09/2015   Bovine pericardial valve.  Procedure: AORTIC VALVE REPLACEMENT (AVR);  Surgeon: Loreli Slot, MD;  Location: [facility] OR;  Service: Open Heart Surgery;   Laterality: N/A;  . CARDIAC CATHETERIZATION  10/2011   EF normal.  Diffuse 3 vessel CAD, no stents placed.  Medical mgmt per [facility].  [name] CARDIAC CATHETERIZATION N/A 06/22/2015   Procedure: Right/Left Heart Cath and Coronary Angiography;  Surgeon: Runell Gess, MD;  Location: [facility] INVASIVE CV LAB;  Service: Cardiovascular;  Laterality: N/A;  . carotid dopplers  06/30/15   Normal: repeat when clinically indicated per Dr. [name]  . CAROTID STENT INSERTION Right 01/16/2014   Procedure: CAROTID STENT INSERTION;  Surgeon: Runell Gess, MD;  Location: [facility] CATH LAB;  Service: Cardiovascular;  Laterality: Right;  . CATARACT EXTRACTION     left  . CORONARY ARTERY BYPASS GRAFT N/A 07/09/2015   Procedure: CORONARY ARTERY BYPASS GRAFTING (CABG)TIMES THREE USING LEFT INTERNAL MAMMARY ARTERY AND LEFT SAPHENOUS LEG VEIN HARVESTED ENDOSCOPICALLY;  Surgeon: Viviann Spare  Lars Pinks Hendrickson, MD;  Location: [facility] OR;  Service: Open Heart Surgery;  Laterality: N/A;  . EYE SURGERY     Multiple laser surgeries for diabetic retinopathy, also cataract surgery OU.  [name]. INTRAOPERATIVE TRANSESOPHAGEAL ECHOCARDIOGRAM N/A 07/09/2015   Procedure: INTRAOPERATIVE TRANSESOPHAGEAL ECHOCARDIOGRAM;  Surgeon: Loreli SlotSteven C Hendrickson, MD;  [facility];  Service: Open Heart Surgery;  Laterality: N/A;  . lasix eye    . LEFT AND RIGHT HEART CATHETERIZATION WITH CORONARY ANGIOGRAM N/A 10/28/2011   Procedure: LEFT AND R0IHT HEART CATHETERIZATION WITH CORONARY ANGIOGRAM;  Surgeon: Lennette Biharihomas A Kelly, MD;  [facility];  Service: Cardiovascular;  Laterality: N/A;  . RETINAL DETACHMENT SURGERY     Right eye  . ROTATOR CUFF REPAIR W/ DISTAL CLAVICLE EXCISION Right 03/2016   Arthroscopic ([facility])  . THYROID SURGERY  2002  . TRANSTHORACIC ECHOCARDIOGRAM  10/2011;12/2012;01/2015; 08/2015; 07/28/16   Mod AS, mild LVH, EF 60-65%, no RWMA.  01/2015 EF 60-65%, grade I DD, progression of mod/sev AS.  08/2015 normal lv fxn with normal fxning bioprosthetic Ao  valve.  07/2016--no change compared to 2017 echo.      FAMHx:  Family History  Problem Relation Age of Onset  . Cancer Mother        lung cancer  . Alcohol abuse Father   . Cancer Father        laryngeal cancer  . Cancer Brother        oldest brother had lung cancer and melanoma    SOCHx:   reports that he has quit smoking. He quit smokeless tobacco use about 30 years ago. He reports current alcohol use. He reports that he does not use drugs.  ALLERGIES:  Allergies  Allergen Reactions  . Rosuvastatin Calcium Other (See Comments)    Problem with higher dosages (Lipitor caused memory issues), also caused leg pains and lethargy  . Statins Other (See Comments)    Problem with higher dosages (Lipitor caused memory issues), also caused leg pains and lethargy  . Tape Rash and Other (See Comments)    Adhesive tape.  (Paper tape OK)    ROS: Pertinent items noted in HPI and remainder of comprehensive ROS otherwise negative.  HOME MEDS: Current Outpatient Medications on File Prior to Visit  Medication Sig Dispense Refill  . aspirin EC 81 MG tablet Take 81 mg by mouth daily.    . clopidogrel (PLAVIX) 75 MG tablet TAKE 1 TABLET BY MOUTH EVERY DAY 90 tablet 3  . clotrimazole-betamethasone (LOTRISONE) cream Apply 1 application topically 2 (two) times daily. 30 g 0  . Evolocumab with Infusor (REPATHA PUSHTRONEX SYSTEM) 420 MG/3.5ML SOCT Inject 1 Dose into the skin every 30 (thirty) days. 1 Cartridge 3  . FEVERFEW PO Take 1 capsule by mouth daily. To prevent migraines    . Insulin Human (INSULIN PUMP) SOLN Inject into the skin continuous. Basal rate .95/hr, current pump settings: 12am .7, 2am 1, 7am 1.1, 11am .9, 5pm .9.25, 11pm .85. Uses Novolog Insulin.    [name]. insulin lispro (HUMALOG) 100 UNIT/ML injection Use 60 units via insulin pump. 90 mL 2  . levothyroxine (SYNTHROID, LEVOTHROID) 150 MCG tablet Take 1 tablet by mouth daily.    . Multiple Vitamins-Minerals (ONE-A-DAY MENS 50+ ADVANTAGE)  TABS Take 1 tablet by mouth daily with breakfast.    . omeprazole (PRILOSEC OTC) 20 MG tablet Take 20 mg by mouth daily.    . tadalafil (CIALIS) 20 MG tablet Take 1 tablet (20 mg total) by mouth daily as  needed for erectile dysfunction. 15 tablet 6   No current facility-administered medications on file prior to visit.     LABS/IMAGING: No results found for this or any previous visit (from the past 48 hour(s)). No results found.  LIPID PANEL:    Component Value Date/Time   CHOL 176 01/12/2018 0954   TRIG 52 01/12/2018 0954   HDL 72 01/12/2018 0954   CHOLHDL 2.4 01/12/2018 0954   CHOLHDL 4.0 05/07/2015 0557   VLDL 15 05/07/2015 0557   LDLCALC 94 01/12/2018 0954   LDLDIRECT 159.5 04/21/2011 0843     WEIGHTS: Wt Readings from Last 3 Encounters:  02/07/18 234 lb (106.1 kg)  01/17/17 229 lb (103.9 kg)  06/17/16 224 lb 9.6 oz (101.9 kg)    VITALS: BP (!) 150/80   Pulse 76   Ht 6\' 2"  (1.88 m)   Wt 234 lb (106.1 kg)   BMI 30.04 kg/m   EXAM: Deferred  EKG: Deferred  ASSESSMENT: 1. Mixed dyslipidemia 2. ASCVD status post 3 vessel CABG and bioprosthetic AVR (2017) 3. Statin intolerance 4. Incomplete responder to PCSK9  PLAN: 1.   [name] has had some more improvement on the Repatha once monthly Pushtronex system.  His cholesterol is accordingly slightly lower however remain still above goal LDL less than 70.  He is also making some aggressive dietary changes with his wife and may be able to improve that further.  Another option would be to add ezetimibe.  He says he may have taken this in the past and had side effects although is not listed as an intolerance.  We could possibly revisit this again but right now he does not want to add any additional treatment.  I would like to recheck his lipid profile in 6 months and will add in an LP(a), as this may represent the reason why he had incomplete response to PCSK9 inhibitor.  There are emerging treatments for LP(a) in clinical  trials and anticipate being able to offer this hopefully within the next year.  Follow-up 6 months.  Chrystie Nose C. , MD, [facility] Highlands Surgery CenterFACC, FACP  [place]  [facility]  Medical Director of the [facility] Lipid [facility] &  Cardiovascular Risk Reduction Clinic Diplomate of the American Board of Clinical Lipidology Attending Cardiologist  Direct Dial: [phone]  Fax: [phone]  Website:  [email or web address]   C  02/07/2018, 8:49 AM

## 2018-02-07 NOTE — Patient Instructions (Signed)
Your physician recommends that you continue on your current medications as directed. Please refer to the Current Medication list given to you today.  Your physician recommends that you return for a FASTING lipid profile and lpa in 6 months   Dr. Rennis Golden recommends that you schedule a follow up visit with him the in the LIPID CLINIC in 6 months. Please have fasting blood work about 1 week prior to this visit and he will review the blood work results with you at your appointment.

## 2018-02-23 ENCOUNTER — Encounter: Payer: Self-pay | Admitting: Family Medicine

## 2018-02-25 IMAGING — MR MR HEAD W/O CM
9 of 10 series · 36 of 48 positions shown · non-contrast
Comparison: CT head without contrast 05/06/2015. MRI brain
01/13/2014.

CLINICAL DATA: Transient right-sided numbness.

EXAM:
MRI HEAD WITHOUT CONTRAST
TECHNIQUE: Multiplanar, multiecho pulse sequences of the brain and surrounding
structures were obtained without intravenous contrast.

[Series 3: T1 · sagittal · 5.0mm · 0.47mm/px · 3 of 23 slices shown]
[im 1/23]
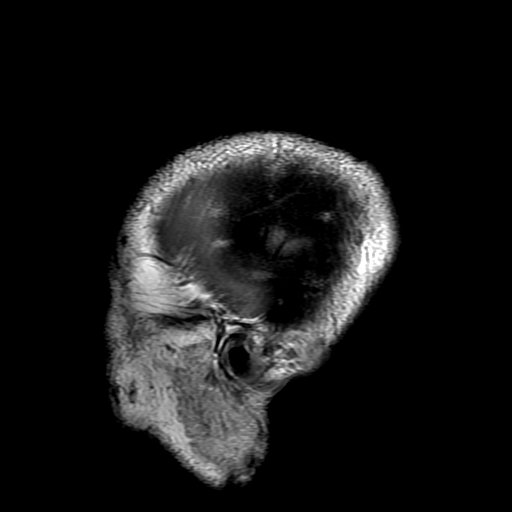
[im 12/23]
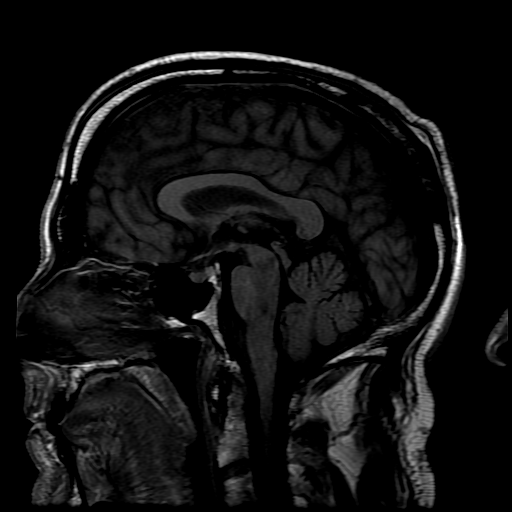
[im 23/23]
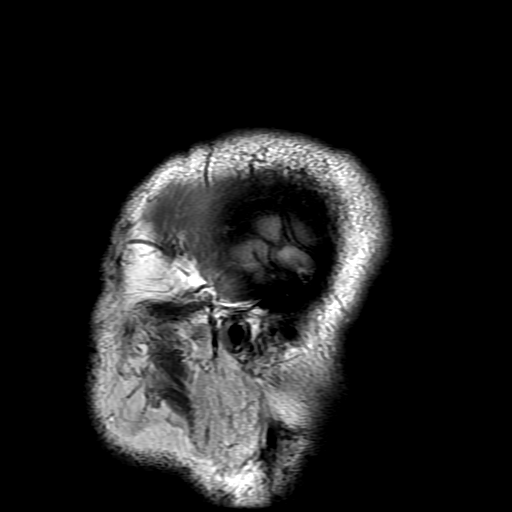

[Series 4: DWI · coronal · 5.0mm · 1.09mm/px · 7 of 72 slices shown (1 of 4)]
[im 1/72]
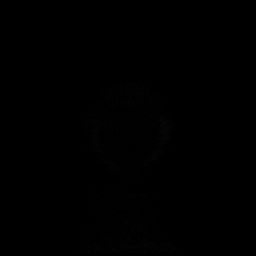
[im 12/72]
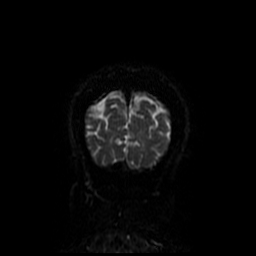
[im 24/72]
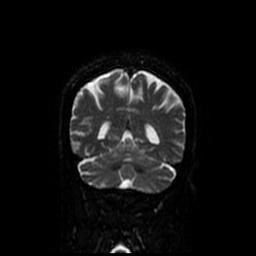
[im 36/72]
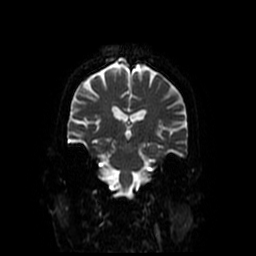
[im 48/72]
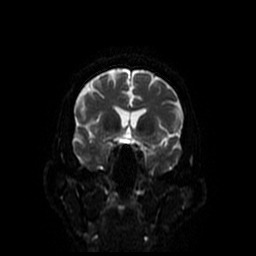
[im 60/72]
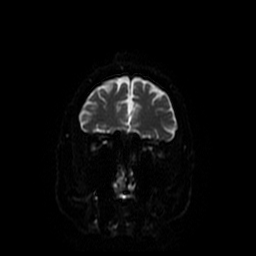
[im 72/72]
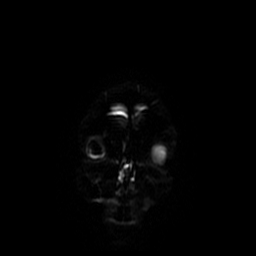

[Series 5: DWI · axial · 3.0mm · 1.09mm/px · z∈[-17,+123]mm · 8 of 96 slices shown (2 of 4)]
[im 1/96]
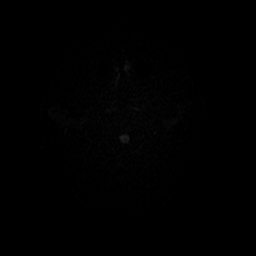
[im 11/96]
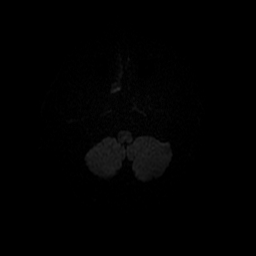
[im 32/96]
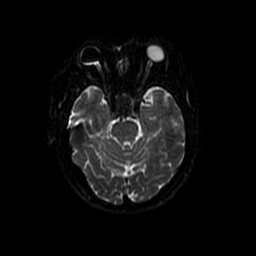
[im 43/96]
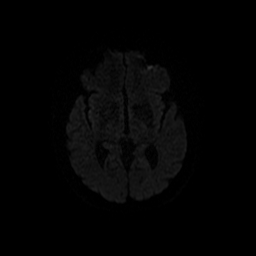
[im 53/96]
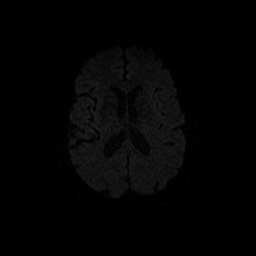
[im 64/96]
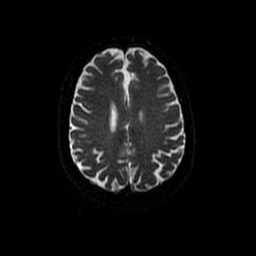
[im 85/96]
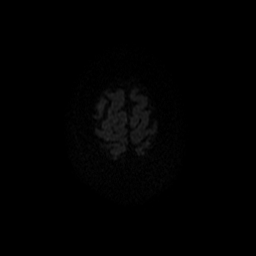
[im 96/96]
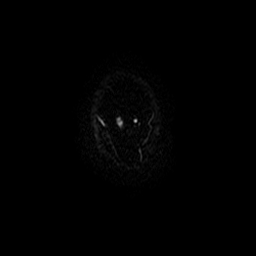

[Series 6: T2 · axial · 5.0mm · 0.43mm/px · z∈[-23,+114]mm · 2 of 24 slices shown (1 of 2)]
[im 1/24]
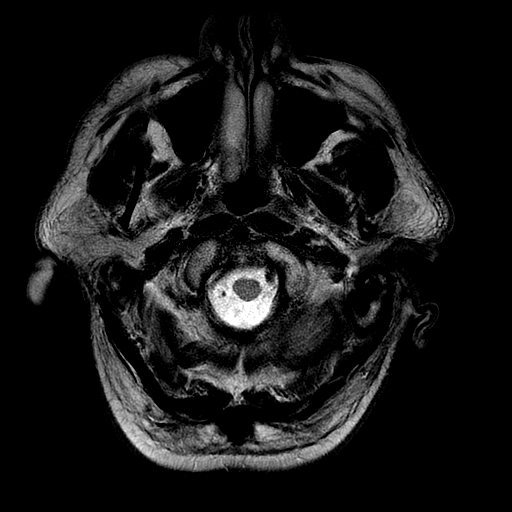
[im 24/24]
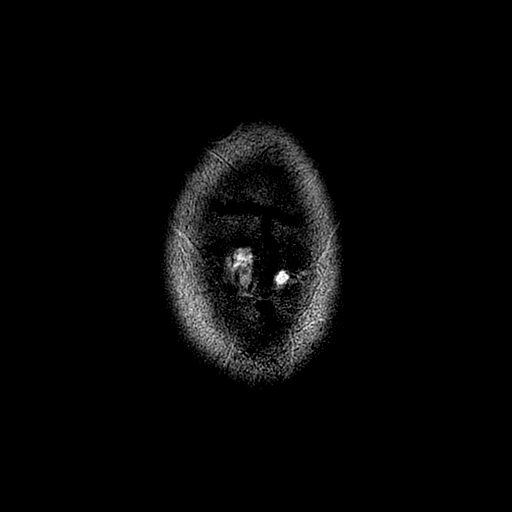

[Series 7: FLAIR · axial · 5.0mm · 0.43mm/px · z∈[-23,+114]mm · 2 of 24 slices shown]
[im 1/24]
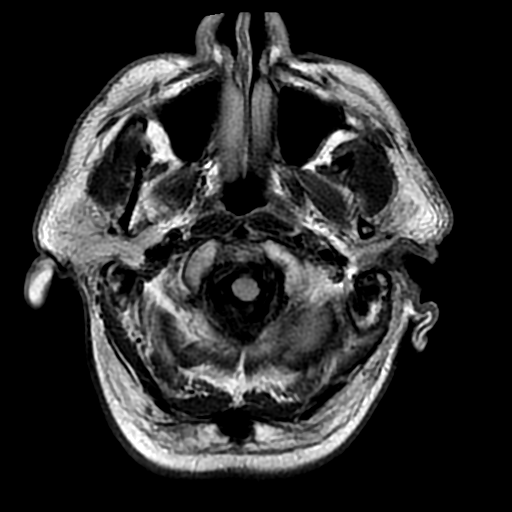
[im 24/24]
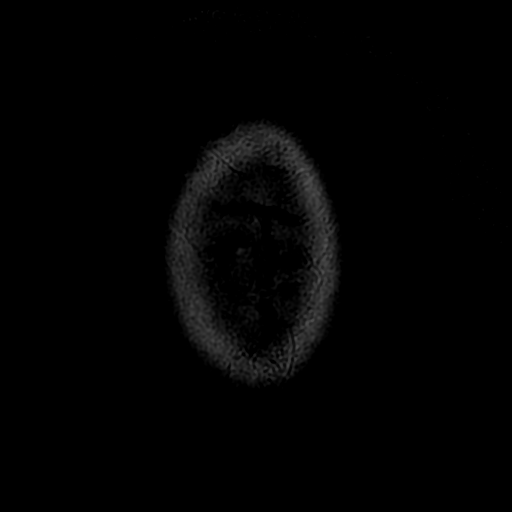

[Series 8: T2 · coronal · 5.0mm · 0.43mm/px · 3 of 30 slices shown (2 of 2)]
[im 1/30]
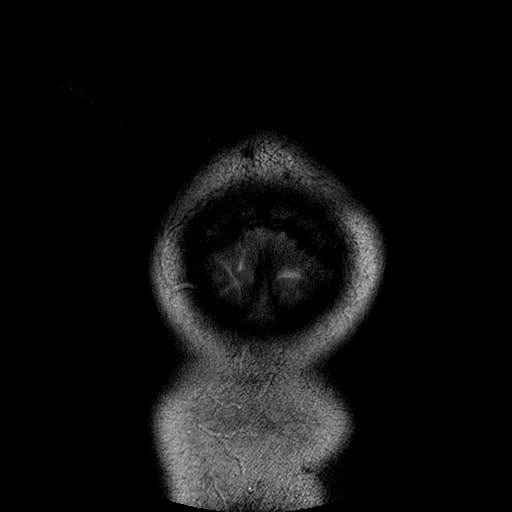
[im 15/30]
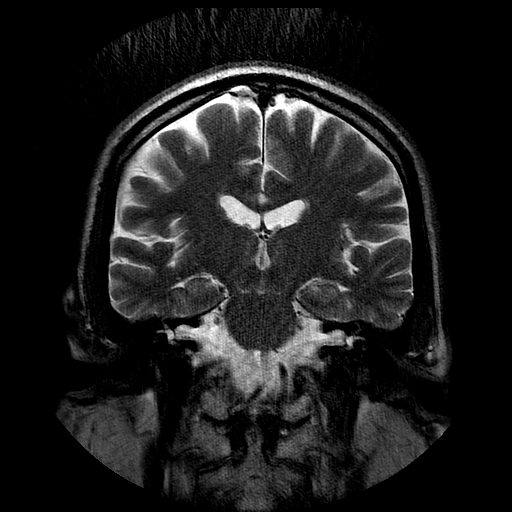
[im 30/30]
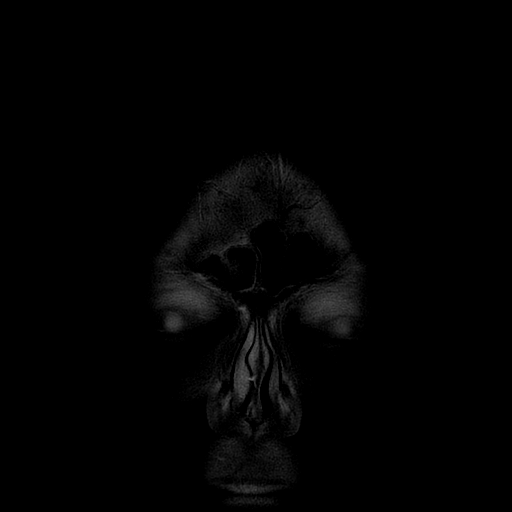

[Series 9: ax mpgr · axial · 5.0mm · 0.45mm/px · z∈[-23,+114]mm · 2 of 24 slices shown]
[im 1/24]
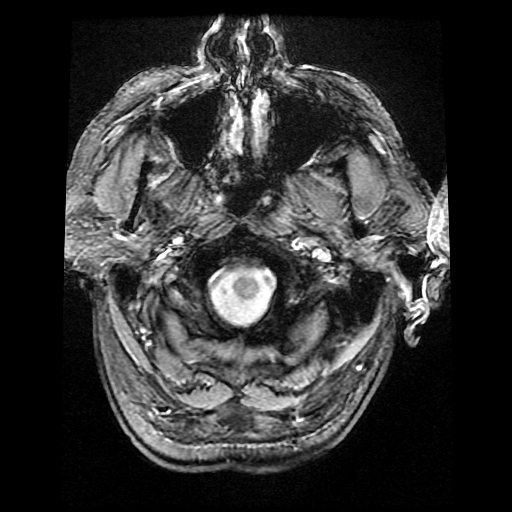
[im 24/24]
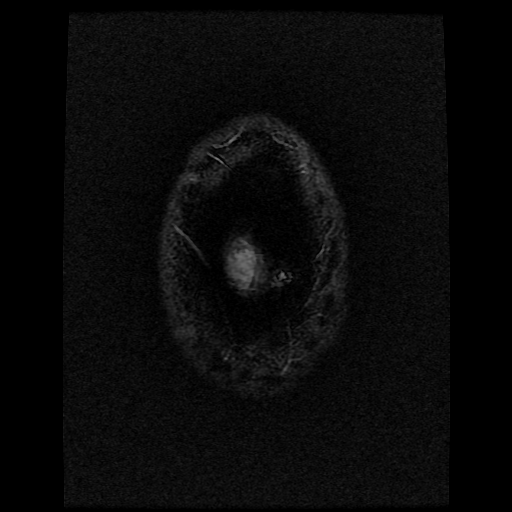

[Series 400: DWI · coronal · 5.0mm · 1.09mm/px · 4 of 36 slices shown (3 of 4)]
[im 1/36]
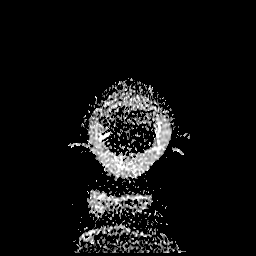
[im 12/36]
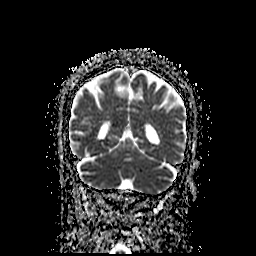
[im 24/36]
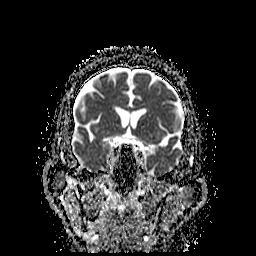
[im 36/36]
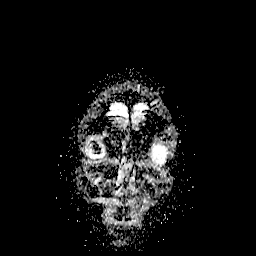

[Series 500: DWI · axial · 3.0mm · 1.09mm/px · z∈[-17,+123]mm · 5 of 48 slices shown (4 of 4)]
[im 1/48]
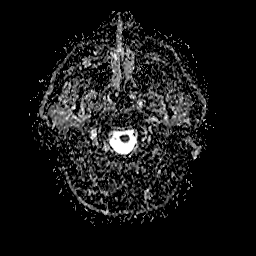
[im 12/48]
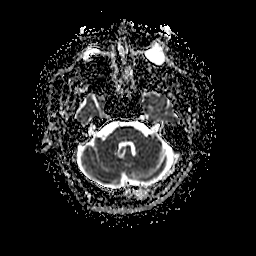
[im 24/48]
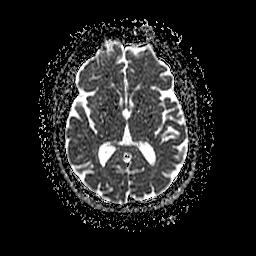
[im 36/48]
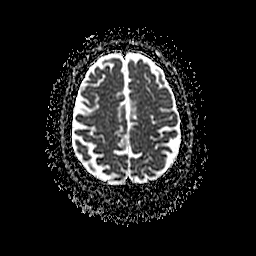
[im 48/48]
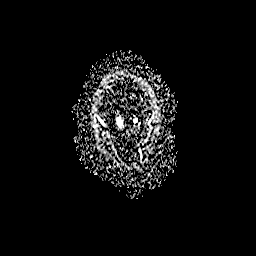

[36 of 48 positions shown; findings below may reference images not displayed]

FINDINGS: The diffusion-weighted images demonstrate no evidence for acute or
subacute infarction. The ventricles are of normal size. No acute
hemorrhage or mass lesion is present. There is no significant white
matter disease. No significant extraaxial fluid collection is
present.

The internal auditory canals are within normal limits bilaterally.
Flow is present in the major intracranial arteries. Chronic atrial
hemorrhage of the right globe is stable. The orbits are otherwise
unremarkable. Mild mucosal thickening is present in the ethmoid air
cells bilaterally. No fluid levels are present. The mastoid air
cells are clear.

Skullbase is within normal limits. Midline sagittal images are
unremarkable.
IMPRESSION: 1. Normal MRI the brain for age.
2. Stable chronic changes in the vitreous of the right globe.

## 2018-04-11 ENCOUNTER — Encounter (HOSPITAL_BASED_OUTPATIENT_CLINIC_OR_DEPARTMENT_OTHER): Payer: Self-pay | Admitting: Emergency Medicine

## 2018-04-11 ENCOUNTER — Emergency Department (HOSPITAL_BASED_OUTPATIENT_CLINIC_OR_DEPARTMENT_OTHER)
Admission: EM | Admit: 2018-04-11 | Discharge: 2018-04-11 | Disposition: A | Discharge status: Home / Self Care | Payer: PRIVATE HEALTH INSURANCE | Attending: Emergency Medicine | Admitting: Emergency Medicine

## 2018-04-11 ENCOUNTER — Other Ambulatory Visit: Payer: Self-pay

## 2018-04-11 DIAGNOSIS — S51011A Laceration without foreign body of right elbow, initial encounter: Secondary | ICD-10-CM

## 2018-04-11 DIAGNOSIS — W01198A Fall on same level from slipping, tripping and stumbling with subsequent striking against other object, initial encounter: Secondary | ICD-10-CM | POA: Insufficient documentation

## 2018-04-11 DIAGNOSIS — Z87891 Personal history of nicotine dependence: Secondary | ICD-10-CM | POA: Diagnosis not present

## 2018-04-11 DIAGNOSIS — Y92007 Garden or yard of unspecified non-institutional (private) residence as the place of occurrence of the external cause: Secondary | ICD-10-CM | POA: Insufficient documentation

## 2018-04-11 DIAGNOSIS — Z7901 Long term (current) use of anticoagulants: Secondary | ICD-10-CM | POA: Insufficient documentation

## 2018-04-11 DIAGNOSIS — Z794 Long term (current) use of insulin: Secondary | ICD-10-CM | POA: Insufficient documentation

## 2018-04-11 DIAGNOSIS — Y9389 Activity, other specified: Secondary | ICD-10-CM | POA: Insufficient documentation

## 2018-04-11 DIAGNOSIS — Z7982 Long term (current) use of aspirin: Secondary | ICD-10-CM | POA: Insufficient documentation

## 2018-04-11 DIAGNOSIS — Y999 Unspecified external cause status: Secondary | ICD-10-CM | POA: Diagnosis not present

## 2018-04-11 MED ORDER — LIDOCAINE-EPINEPHRINE (PF) 2 %-1:200000 IJ SOLN
10.0000 mL | Freq: Once | INTRAMUSCULAR | Status: AC
  Administered 2018-04-11: 10 mL
  Filled 2018-04-11 (×2): qty 10

## 2018-04-11 NOTE — Discharge Instructions (Signed)
Remove staples in 7-10 days

## 2018-04-11 NOTE — ED Notes (Signed)
ED Provider at bedside. 

## 2018-04-11 NOTE — ED Provider Notes (Signed)
[facility] EMERGENCY DEPARTMENT Provider Note   CSN: [id number] Arrival date & time: 04/11/18  2048    History   Chief Complaint Chief Complaint  Patient presents with  . Laceration    HPI [name] is a 68 y.o. male.     The history is provided by the patient.  Laceration  Location:  Shoulder/arm Shoulder/arm laceration location:  R elbow Length:  4cm Depth:  Through dermis Quality: stellate   Bleeding: controlled   Time since incident:  1 hour Laceration mechanism:  Fall and blunt object (was cleaning out his fish pond and misjudged his step and tripped and fell landing on his right elbow on a flag stone) Pain details:    Quality:  Aching   Severity:  Mild   Timing:  Constant   Progression:  Unchanged Foreign body present:  No foreign bodies Relieved by:  Pressure Worsened by:  Movement Ineffective treatments:  None tried Tetanus status:  Up to date Associated symptoms: swelling   Associated symptoms: no focal weakness and no numbness   Associated symptoms comment:  No head injury or LOC.  no pain in the legs or wrist.   Past Medical History:  Diagnosis Date  . Abnormal stress test 10/27/2011   lat isch--cardiac cath 10/28/2011  . Aortic stenosis, moderate Sept/Oct /2013   2017-> s/p bioprosthetic AV [facility] Ease bovine pericardial valve model 3300 TFX, ser # [id number]).  . 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 Inferolateral perfusion defect + EKG abnormality during stress testing prompted Cath by [facility]: 3V CAD, EF normal.  Med mgmt recommended; b. 06/2015 Cath: 3VD and mod AS; c. 07/2015 CABG x 3 (LIMA->LAD, VG->OM2, VG->PDA) w/ AVR.  [name] Carotid stenosis, right 09/2011   Dr. [name] did R carotid stenting 01/2014.  Carotid dopplers 06/2015 essentially normal.  No change 07/2016--repeat 1 yr.  . Complication of anesthesia    difficulty with intubation,   . Decreased pedal pulses    LE doppler 11/08/11- no evidence of arterial insufficiency  .  Diabetes mellitus Dx'd age 5   Novolog via insulin pump; sub-optimal control x years  . Diabetic neuropathy (HCC)    fine touch and position sense affected  . Diabetic retinopathy    Proliferative: Hx of retinal detachment on right-light perception only in right eye  . Diastolic dysfunction 2007; 2016   TEE with diastolic dysfunction, EF 74%.  . Difficult intubation    ~ 2002 difficult fiberoptic intubation with takeback bleeding s/p thyroidectomy;  for thyroidectomy was intubated DL X 1 with cricoid pressure but difficult mask (full beard)  . Dizziness    feels off balance  . Erectile dysfunction    [place]rolog[place]  . GERD (gastroesophageal reflux disease)   . History of tobacco abuse    Quit about 1990 (has 35 pack-yr hx)  . Hyperlipemia, mixed    a. intolerant of statins/zeta; per Dr. [name] 12/2016, pt started on PCSK9--incomplete responder?---01/2017.  [name] Hypothyroidism, postsurgical    Thyroidectomy 2002; multinodular goiter.  Dr. Elvera Lennox managing this as of 10/2015.  . Impaired vision    right eye light perception only; hx of retinal detachment.  . Impingement syndrome of right shoulder    08/26/15 Pt got subacromial steroid injection by orthopedist (Dr. Delfin Edis).  [facility] f/u 01/2016--MRI showed RC tear + AC joint arthritis, arthroscopic surgery done 03/2016 ([facility]).  . Neuromuscular disorder (HCC)    neuropathy  . OSA (obstructive sleep apnea)  06/2011 sleep study: moderate OSA, CPAP at 12 cm H2O.  . Osteoarthritis    Bilat thumb carpometacarpal joints.  Ortho injected each thumb x 2 in 2017.  Also injected 03/14/17.  [name] Recurrent pneumonia   . Shortness of breath dyspnea   . Stroke [facility])    TIA  . TIA (transient ischemic attack) 2015/16   with R carotid dz: pt got R carotid stent 01/2014 by Dr. [name] and was put on dual antiplatelet therapy with ASA  and plavix  qd afterwards  . TIA (transient ischemic attack) 05/2015   Left brain (right  sided numbness + slurred speech)  Admitted for obs/workup 05/2015.  [name] Venous insufficiency   . Venous reflux 10/14/2011   venous doppler-R GSV continuous reflux throughout; too small for VNUS closure    Patient Active Problem List   Diagnosis Date Noted  . Statin intolerance 01/18/2017  . S/P CABG x 3 07/14/2015  . S/P AVR 07/09/2015  . Numbness and tingling of right arm and leg 05/06/2015  . Polypharmacy 04/09/2014  . Carotid occlusion, right 01/16/2014  . Carotid stenosis   . Stroke (HCC)   . TIA (transient ischemic attack) 01/12/2014  . Pre-syncope 05/13/2013  . Essential hypertension 03/18/2013  . Trochanteric bursitis of left hip 01/18/2013  . Preventative health care 01/18/2013  . OSA (obstructive sleep apnea)   . Mild cognitive impairment 05/09/2012  . Poorly controlled type 1 diabetes mellitus with circulatory disorder (HCC) 05/09/2012  . CAD (coronary artery disease) 01/11/2012  . Abnormal nuclear cardiac imaging test, 10/2011 10/28/2011  . PAD (peripheral artery disease) (HCC) 09/28/2011  . 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 Aortic stenosis, moderate 09/28/2011  . Carotid stenosis, right 09/28/2011  . Colon cancer screening 04/21/2011  . Fatigue 04/21/2011  . Hyperlipidemia 03/21/2011  . Diabetic peripheral neuropathy (HCC) 01/19/2011  . Restless legs syndrome 01/19/2011  . Hypothyroidism 01/13/2011    Past Surgical History:  Procedure Laterality Date  . ABI  01/2016   Normal.  Dr. [name] to repeat in 1 yr.  . AORTIC VALVE REPLACEMENT N/A 07/09/2015   Bovine pericardial valve.  Procedure: AORTIC VALVE REPLACEMENT (AVR);  Surgeon: Loreli Slot, MD;  Location: [facility] OR;  Service: Open Heart Surgery;  Laterality: N/A;  . CARDIAC CATHETERIZATION  10/2011   EF normal.  Diffuse 3 vessel CAD, no stents placed.  Medical mgmt per [facility].  [name] CARDIAC CATHETERIZATION N/A 06/22/2015   Procedure: Right/Left Heart Cath and Coronary Angiography;  Surgeon: Runell Gess,  MD;  Location: [facility] INVASIVE CV LAB;  Service: Cardiovascular;  Laterality: N/A;  . carotid dopplers  06/30/15   Normal: repeat when clinically indicated per Dr. [name]  . CAROTID STENT INSERTION Right 01/16/2014   Procedure: CAROTID STENT INSERTION;  Surgeon: Runell Gess, MD;  Location: [facility] CATH LAB;  Service: Cardiovascular;  Laterality: Right;  . CATARACT EXTRACTION     left  . CORONARY ARTERY BYPASS GRAFT N/A 07/09/2015   Procedure: CORONARY ARTERY BYPASS GRAFTING (CABG)TIMES THREE USING LEFT INTERNAL MAMMARY ARTERY AND LEFT SAPHENOUS LEG VEIN HARVESTED ENDOSCOPICALLY;  Surgeon: Loreli Slot, MD;  Location: [facility] OR;  Service: Open Heart Surgery;  Laterality: N/A;  . EYE SURGERY     Multiple laser surgeries for diabetic retinopathy, also cataract surgery OU.  [name] INTRAOPERATIVE TRANSESOPHAGEAL ECHOCARDIOGRAM N/A 07/09/2015   Procedure: INTRAOPERATIVE TRANSESOPHAGEAL ECHOCARDIOGRAM;  Surgeon: Loreli Slot, MD;  Location: [facility] OR;  Service: Open Heart Surgery;  Laterality: N/A;  . lasix eye    .  LEFT AND RIGHT HEART CATHETERIZATION WITH CORONARY ANGIOGRAM N/A 10/28/2011   Procedure: LEFT AND R0IHT HEART CATHETERIZATION WITH CORONARY ANGIOGRAM;  Surgeon: Lennette Bihari, MD;  Location: [facility] CATH LAB;  Service: Cardiovascular;  Laterality: N/A;  . RETINAL DETACHMENT SURGERY     Right eye  . ROTATOR CUFF REPAIR W/ DISTAL CLAVICLE EXCISION Right 03/2016   Arthroscopic ([facility])  . THYROID SURGERY  2002  . TRANSTHORACIC ECHOCARDIOGRAM  10/2011;12/2012;01/2015; 08/2015; 07/28/16   Mod AS, mild LVH, EF 60-65%, no RWMA.  01/2015 EF 60-65%, grade I DD, progression of mod/sev AS.  08/2015 normal lv fxn with normal fxning bioprosthetic Ao valve.  07/2016--no change compared to 2017 echo.          Home Medications    Prior to Admission medications   Medication Sig Start Date End Date Taking? Authorizing Provider  aspirin EC 81 MG tablet Take 81 mg by mouth daily.    [provider]   clopidogrel (PLAVIX) 75 MG tablet TAKE 1 TABLET BY MOUTH EVERY DAY 07/04/16   Runell Gess, MD  clotrimazole-betamethasone (LOTRISONE) cream Apply 1 application topically 2 (two) times daily. 08/25/16   McGowen, Maryjean Morn, MD  Evolocumab with Infusor (REPATHA PUSHTRONEX SYSTEM) 420 MG/3.5ML SOCT Inject 1 Dose into the skin every 30 (thirty) days. 01/23/17   Hilty, Lisette Abu, MD  FEVERFEW PO Take 1 capsule by mouth daily. To prevent migraines    [provider]  Insulin Human (INSULIN PUMP) SOLN Inject into the skin continuous. Basal rate .95/hr, current pump settings: 12am .7, 2am 1, 7am 1.1, 11am .9, 5pm .9.25, 11pm .85. Uses Novolog Insulin.    [provider]  insulin lispro (HUMALOG) 100 UNIT/ML injection Use 60 units via insulin pump. 08/01/16   Carlus Pavlov, MD  levothyroxine (SYNTHROID, LEVOTHROID) 150 MCG tablet Take 1 tablet by mouth daily. 08/30/17   [provider]  Multiple Vitamins-Minerals (ONE-A-DAY MENS 50+ ADVANTAGE) TABS Take 1 tablet by mouth daily with breakfast.    [provider]  omeprazole (PRILOSEC OTC) 20 MG tablet Take 20 mg by mouth daily.    [provider]  tadalafil (CIALIS) 20 MG tablet Take 1 tablet (20 mg total) by mouth daily as needed for erectile dysfunction. 10/23/15   Carlus Pavlov, MD    Family History Family History  Problem Relation Age of Onset  . Cancer Mother        lung cancer  . Alcohol abuse Father   . Cancer Father        laryngeal cancer  . Cancer Brother        oldest brother had lung cancer and melanoma    Social History Social History   Tobacco Use  . Smoking status: Former Games developer  . Smokeless tobacco: Former Neurosurgeon    Quit date: 06/22/1987  Substance Use Topics  . Alcohol use: Yes    Alcohol/week: 0.0 standard drinks    Comment: occasional  . Drug use: No     Allergies   Rosuvastatin calcium; Statins; and Tape   Review of Systems Review of Systems  Neurological:  Negative for focal weakness.  All other systems reviewed and are negative.    Physical Exam Updated Vital Signs BP (!) 106/93 (BP Location: Left Arm)   Pulse 84   Temp 98.1 F (36.7 C) (Oral)   Resp 16   Ht 6\' 2"  (1.88 m)   Wt 102.1 kg   SpO2 98%   BMI 28.89 kg/m   Physical  Exam Vitals signs and nursing note reviewed.  Constitutional:      General: He is not in acute distress.    Appearance: He is well-developed.  HENT:     Head: Normocephalic and atraumatic.  Eyes:     Conjunctiva/sclera: Conjunctivae normal.     Pupils: Pupils are equal, round, and reactive to light.  Neck:     Musculoskeletal: Normal range of motion and neck supple.  Cardiovascular:     Rate and Rhythm: Normal rate.  Pulmonary:     Effort: Pulmonary effort is normal.  Musculoskeletal: Normal range of motion.        General: Tenderness present.     Right shoulder: Normal.     Right elbow: He exhibits laceration. He exhibits normal range of motion. Tenderness found. Olecranon process tenderness noted. No radial head, no medial epicondyle and no lateral epicondyle tenderness noted.     Right wrist: Normal.       Arms:  Skin:    General: Skin is warm and dry.     Findings: No erythema or rash.  Neurological:     Mental Status: He is alert and oriented to person, place, and time.  Psychiatric:        Behavior: Behavior normal.      ED Treatments / Results  Labs (all labs ordered are listed, but only abnormal results are displayed) Labs Reviewed - No data to display  EKG None  Radiology No results found.  Procedures Procedures (including critical care time) LACERATION REPAIR Performed by: [employer] RxWhitney Sakari Alkhatib Authorized by: Gwyneth SproutWhitney Necia Kamm Consent: Verbal consent obtained. Risks and benefits: risks, benefits and alternatives were discussed Consent given by: patient Patient identity confirmed: provided demographic data Prepped and Draped in normal sterile fashion Wound explored   Laceration Location: right elbow  Laceration Length: 4cm  No Foreign Bodies seen or palpated  Anesthesia: local infiltration  Local anesthetic: lidocaine 2% with epinephrine  Anesthetic total: 4 ml  Irrigation method: syringe Amount of cleaning: standard  Skin closure: staples  Number of sutures: 5   Patient tolerance: Patient tolerated the procedure well with no immediate complications.   Medications Ordered in ED Medications  lidocaine-EPINEPHrine (XYLOCAINE W/EPI) 2 %-1:200000 (PF) injection 10 mL (10 mLs Infiltration Given 04/11/18 2108)     Initial Impression / Assessment and Plan / ED Course  I have reviewed the triage vital signs and the nursing notes.  Pertinent labs & imaging results that were available during my care of the patient were reviewed by me and considered in my medical decision making (see chart for details).        Patient presenting after a mechanical fall onto his elbow with a laceration.  He does take Plavix but did not hit his head or lose consciousness.  Low suspicion for bony injury as patient is able to completely flex and extend at the elbow.  He has no other signs of injury elsewhere.  Tetanus shot is up-to-date.  Wound repaired as above.  Final Clinical Impressions(s) / ED Diagnoses   Final diagnoses:  Elbow laceration, right, initial encounter    ED Discharge Orders    None       Gwyneth SproutPlunkett, Sweta Halseth, MD 04/11/18 2135

## 2018-04-11 NOTE — ED Triage Notes (Signed)
Pt arrives after fall and scraped his elbow.Approximaated avulsion to R elbow, bleeding controlled. Reports TDAP in last 5-10 years

## 2018-04-14 ENCOUNTER — Emergency Department (HOSPITAL_BASED_OUTPATIENT_CLINIC_OR_DEPARTMENT_OTHER)
Admission: EM | Admit: 2018-04-14 | Discharge: 2018-04-14 | Disposition: A | Discharge status: Home / Self Care | Payer: PRIVATE HEALTH INSURANCE | Source: Home / Self Care | Attending: Emergency Medicine | Admitting: Emergency Medicine

## 2018-04-14 ENCOUNTER — Other Ambulatory Visit: Payer: Self-pay

## 2018-04-14 ENCOUNTER — Encounter (HOSPITAL_BASED_OUTPATIENT_CLINIC_OR_DEPARTMENT_OTHER): Payer: Self-pay | Admitting: Emergency Medicine

## 2018-04-14 ENCOUNTER — Telehealth: Payer: Self-pay | Admitting: Surgery

## 2018-04-14 DIAGNOSIS — Z7982 Long term (current) use of aspirin: Secondary | ICD-10-CM | POA: Insufficient documentation

## 2018-04-14 DIAGNOSIS — L089 Local infection of the skin and subcutaneous tissue, unspecified: Secondary | ICD-10-CM

## 2018-04-14 DIAGNOSIS — Z951 Presence of aortocoronary bypass graft: Secondary | ICD-10-CM | POA: Insufficient documentation

## 2018-04-14 DIAGNOSIS — Z79899 Other long term (current) drug therapy: Secondary | ICD-10-CM

## 2018-04-14 DIAGNOSIS — L03113 Cellulitis of right upper limb: Secondary | ICD-10-CM | POA: Diagnosis not present

## 2018-04-14 DIAGNOSIS — Y658 Other specified misadventures during surgical and medical care: Secondary | ICD-10-CM | POA: Insufficient documentation

## 2018-04-14 DIAGNOSIS — Z8673 Personal history of transient ischemic attack (TIA), and cerebral infarction without residual deficits: Secondary | ICD-10-CM

## 2018-04-14 DIAGNOSIS — E119 Type 2 diabetes mellitus without complications: Secondary | ICD-10-CM

## 2018-04-14 DIAGNOSIS — S50351A Superficial foreign body of right elbow, initial encounter: Secondary | ICD-10-CM

## 2018-04-14 DIAGNOSIS — Z87891 Personal history of nicotine dependence: Secondary | ICD-10-CM | POA: Insufficient documentation

## 2018-04-14 DIAGNOSIS — Z794 Long term (current) use of insulin: Secondary | ICD-10-CM | POA: Insufficient documentation

## 2018-04-14 DIAGNOSIS — T148XXA Other injury of unspecified body region, initial encounter: Secondary | ICD-10-CM | POA: Diagnosis not present

## 2018-04-14 DIAGNOSIS — T8140XD Infection following a procedure, unspecified, subsequent encounter: Secondary | ICD-10-CM | POA: Insufficient documentation

## 2018-04-14 MED ORDER — LIDOCAINE HCL (PF) 1 % IJ SOLN
5.0000 mL | Freq: Once | INTRAMUSCULAR | Status: AC
  Administered 2018-04-14: 5 mL
  Filled 2018-04-14: qty 5

## 2018-04-14 MED ORDER — OXYCODONE-ACETAMINOPHEN 5-325 MG PO TABS
1.0000 | ORAL_TABLET | Freq: Once | ORAL | Status: AC
  Administered 2018-04-14: 1 via ORAL
  Filled 2018-04-14: qty 1

## 2018-04-14 MED ORDER — CLINDAMYCIN HCL 300 MG PO CAPS: 300.0000 mg | ORAL_CAPSULE | Freq: Three times a day (TID) | ORAL | 0 refills | Status: DC

## 2018-04-14 NOTE — ED Notes (Signed)
ED Provider at bedside. 

## 2018-04-14 NOTE — Discharge Instructions (Addendum)
Your wound is now open. It will still heal by what is called secondary intention ("heal from the inside out"). Unfortunately this will result in a larger scar and healing time will be a little longer but it is the most appropriate care in the setting of infection. I do not expect this to give you any long term disability otherwise. Clean wound daily with mild soap and warm water. Pat dry and then apply a simple dressing. I want you rechecked if you do not feel like symptoms are improving in the next 36 hours or if you feel like they are worsening.

## 2018-04-14 NOTE — ED Provider Notes (Signed)
[facility] EMERGENCY DEPARTMENT Provider Note   CSN: [id number] Arrival date & time: 04/14/18  [id number]    History   Chief Complaint Chief Complaint  Patient presents with  . Wound Check    HPI [name] is a 68 y.o. male.     HPI   67yM with R elbow pain and swelling. Seen in ED on 4/8 after falling and had a flapped laceration repaired. About a day later began having swelling and redness. Subsequently pescribed keflex by UC. Has taken 4 doses with progression of symptoms. Some drainage. No fever or chills.   Past Medical History:  Diagnosis Date  . Abnormal stress test 10/27/2011   lat isch--cardiac cath 10/28/2011  . Aortic stenosis, moderate Sept/Oct /2013   2017-> s/p bioprosthetic AV [facility](Edwards Magna Ease bovine pericardial valve model 3300 TFX, ser # [id number]).  . 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 Inferolateral perfusion defect + EKG abnormality during stress testing prompted Cath by [facility]: 3V CAD, EF normal.  Med mgmt recommended; b. 06/2015 Cath: 3VD and mod AS; c. 07/2015 CABG x 3 (LIMA->LAD, VG->OM2, VG->PDA) w/ AVR.  [name]. Carotid stenosis, right 09/2011   Dr. Allyson SabalBerry did R carotid stenting 01/2014.  Carotid dopplers 06/2015 essentially normal.  No change 07/2016--repeat 1 yr.  . Complication of anesthesia    difficulty with intubation,   . Decreased pedal pulses    LE doppler 11/08/11- no evidence of arterial insufficiency  . Diabetes mellitus Dx'd age 68   Novolog via insulin pump; sub-optimal control x years  . Diabetic neuropathy (HCC)    fine touch and position sense affected  . Diabetic retinopathy    Proliferative: Hx of retinal detachment on right-light perception only in right eye  . Diastolic dysfunction 2007; 2016   TEE with diastolic dysfunction, EF 74%.  . Difficult intubation    ~ 2002 difficult fiberoptic intubation with takeback bleeding s/p thyroidectomy;  for thyroidectomy was intubated DL X 1 with cricoid pressure but difficult mask (full  beard)  . Dizziness    feels off balance  . Erectile dysfunction    [place]  . GERD (gastroesophageal reflux disease)   . History of tobacco abuse    Quit about 1990 (has 35 pack-yr hx)  . Hyperlipemia, mixed    a. intolerant of statins/zeta; per Dr. Allyson SabalBerry 12/2016, pt started on PCSK9--incomplete responder?---01/2017.  [name]. Hypothyroidism, postsurgical    Thyroidectomy 2002; multinodular goiter.  Dr. Elvera LennoxGherghe managing this as of 10/2015.  . Impaired vision    right eye light perception only; hx of retinal detachment.  . Impingement syndrome of right shoulder    08/26/15 Pt got subacromial steroid injection by orthopedist (Dr. Delfin EdisJohn Ritchie).  [facility] f/u 01/2016--MRI showed RC tear + AC joint arthritis, arthroscopic surgery done 03/2016 ([facility]).  . Neuromuscular disorder (HCC)    neuropathy  . OSA (obstructive sleep apnea)    06/2011 sleep study: moderate OSA, CPAP at 12 cm H2O.  . Osteoarthritis    Bilat thumb carpometacarpal joints.  Ortho injected each thumb x 2 in 2017.  Also injected 03/14/17.  [name]. Recurrent pneumonia   . Shortness of breath dyspnea   . Stroke [facility](HCC)    TIA  . TIA (transient ischemic attack) 2015/16   with R carotid dz: pt got R carotid stent 01/2014 by Dr. Allyson SabalBerry and was put on dual antiplatelet therapy with ASA 325mg  and plavix 75mg  qd afterwards  . TIA (transient ischemic attack) 05/2015  Left brain (right sided numbness + slurred speech)  Admitted for obs/workup 05/2015.  [name] Venous insufficiency   . Venous reflux 10/14/2011   venous doppler-R GSV continuous reflux throughout; too small for VNUS closure    Patient Active Problem List   Diagnosis Date Noted  . Statin intolerance 01/18/2017  . S/P CABG x 3 07/14/2015  . S/P AVR 07/09/2015  . Numbness and tingling of right arm and leg 05/06/2015  . Polypharmacy 04/09/2014  . Carotid occlusion, right 01/16/2014  . Carotid stenosis   . Stroke (HCC)   . TIA (transient ischemic attack)  01/12/2014  . Pre-syncope 05/13/2013  . Essential hypertension 03/18/2013  . Trochanteric bursitis of left hip 01/18/2013  . Preventative health care 01/18/2013  . OSA (obstructive sleep apnea)   . Mild cognitive impairment 05/09/2012  . Poorly controlled type 1 diabetes mellitus with circulatory disorder (HCC) 05/09/2012  . CAD (coronary artery disease) 01/11/2012  . Abnormal nuclear cardiac imaging test, 10/2011 10/28/2011  . PAD (peripheral artery disease) (HCC) 09/28/2011  . 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 Aortic stenosis, moderate 09/28/2011  . Carotid stenosis, right 09/28/2011  . Colon cancer screening 04/21/2011  . Fatigue 04/21/2011  . Hyperlipidemia 03/21/2011  . Diabetic peripheral neuropathy (HCC) 01/19/2011  . Restless legs syndrome 01/19/2011  . Hypothyroidism 01/13/2011    Past Surgical History:  Procedure Laterality Date  . ABI  01/2016   Normal.  Dr. [name] to repeat in 1 yr.  . AORTIC VALVE REPLACEMENT N/A 07/09/2015   Bovine pericardial valve.  Procedure: AORTIC VALVE REPLACEMENT (AVR);  Surgeon: Loreli Slot, MD;  Location: [facility] OR;  Service: Open Heart Surgery;  Laterality: N/A;  . CARDIAC CATHETERIZATION  10/2011   EF normal.  Diffuse 3 vessel CAD, no stents placed.  Medical mgmt per [facility].  [name] CARDIAC CATHETERIZATION N/A 06/22/2015   Procedure: Right/Left Heart Cath and Coronary Angiography;  Surgeon: Runell Gess, MD;  Location[facility] INVASIVE CV LAB;  Service: Cardiovascular;  Laterality: N/A;  . carotid dopplers  06/30/15   Normal: repeat when clinically indicated per Dr. [name]  . CAROTID STENT INSERTION Right 01/16/2014   Procedure: CAROTID STENT INSERTION;  Surgeon: Runell Gess, MD;  Location: [facility] CATH LAB;  Service: Cardiovascular;  Laterality: Right;  . CATARACT EXTRACTION     left  . CORONARY ARTERY BYPASS GRAFT N/A 07/09/2015   Procedure: CORONARY ARTERY BYPASS GRAFTING (CABG)TIMES THREE USING LEFT INTERNAL MAMMARY ARTERY AND LEFT  SAPHENOUS LEG VEIN HARVESTED ENDOSCOPICALLY;  Surgeon: Loreli Slot, MD;  Location: [facility] OR;  Service: Open Heart Surgery;  Laterality: N/A;  . EYE SURGERY     Multiple laser surgeries for diabetic retinopathy, also cataract surgery OU.  [name] INTRAOPERATIVE TRANSESOPHAGEAL ECHOCARDIOGRAM N/A 07/09/2015   Procedure: INTRAOPERATIVE TRANSESOPHAGEAL ECHOCARDIOGRAM;  Surgeon: Loreli Slot, MD;  Location: [facility] OR;  Service: Open Heart Surgery;  Laterality: N/A;  . lasix eye    . LEFT AND RIGHT HEART CATHETERIZATION WITH CORONARY ANGIOGRAM N/A 10/28/2011   Procedure: LEFT AND R0IHT HEART CATHETERIZATION WITH CORONARY ANGIOGRAM;  Surgeon: Lennette Bihari, MD;  Location: [facility] CATH LAB;  Service: Cardiovascular;  Laterality: N/A;  . RETINAL DETACHMENT SURGERY     Right eye  . ROTATOR CUFF REPAIR W/ DISTAL CLAVICLE EXCISION Right 03/2016   Arthroscopic ([facility])  . THYROID SURGERY  2002  . TRANSTHORACIC ECHOCARDIOGRAM  10/2011;12/2012;01/2015; 08/2015; 07/28/16   Mod AS, mild LVH, EF 60-65%, no RWMA.  01/2015 EF 60-65%, grade I DD, progression  of mod/sev AS.  08/2015 normal lv fxn with normal fxning bioprosthetic Ao valve.  07/2016--no change compared to 2017 echo.          Home Medications    Prior to Admission medications   Medication Sig Start Date End Date Taking? Authorizing Provider  aspirin EC 81 MG tablet Take 81 mg by mouth daily.    [provider]  clopidogrel (PLAVIX) 75 MG tablet TAKE 1 TABLET BY MOUTH EVERY DAY 07/04/16   Runell Gess, MD  clotrimazole-betamethasone (LOTRISONE) cream Apply 1 application topically 2 (two) times daily. 08/25/16   McGowen, Maryjean Morn, MD  Evolocumab with Infusor (REPATHA PUSHTRONEX SYSTEM) 420 MG/3.5ML SOCT Inject 1 Dose into the skin every 30 (thirty) days. 01/23/17   Hilty, Lisette Abu, MD  FEVERFEW PO Take 1 capsule by mouth daily. To prevent migraines    [provider]  Insulin Human (INSULIN PUMP) SOLN Inject into the skin  continuous. Basal rate .95/hr, current pump settings: 12am .7, 2am 1, 7am 1.1, 11am .9, 5pm .9.25, 11pm .85. Uses Novolog Insulin.    [provider]  insulin lispro (HUMALOG) 100 UNIT/ML injection Use 60 units via insulin pump. 08/01/16   Carlus Pavlov, MD  levothyroxine (SYNTHROID, LEVOTHROID) 150 MCG tablet Take 1 tablet by mouth daily. 08/30/17   [provider]  Multiple Vitamins-Minerals (ONE-A-DAY MENS 50+ ADVANTAGE) TABS Take 1 tablet by mouth daily with breakfast.    [provider]  omeprazole (PRILOSEC OTC) 20 MG tablet Take 20 mg by mouth daily.    [provider]  tadalafil (CIALIS) 20 MG tablet Take 1 tablet (20 mg total) by mouth daily as needed for erectile dysfunction. 10/23/15   Carlus Pavlov, MD    Family History Family History  Problem Relation Age of Onset  . Cancer Mother        lung cancer  . Alcohol abuse Father   . Cancer Father        laryngeal cancer  . Cancer Brother        oldest brother had lung cancer and melanoma    Social History Social History   Tobacco Use  . Smoking status: Former Games developer  . Smokeless tobacco: Former Neurosurgeon    Quit date: 06/22/1987  Substance Use Topics  . Alcohol use: Yes    Alcohol/week: 0.0 standard drinks    Comment: occasional  . Drug use: No     Allergies   Rosuvastatin calcium; Statins; and Tape   Review of Systems Review of Systems  All systems reviewed and negative, other than as noted in HPI.  Physical Exam Updated Vital Signs BP (!) 150/67 (BP Location: Left Arm)   Pulse 95   Temp 98.7 F (37.1 C) (Oral)   Resp 18   Ht  (1.88 m)   Wt 102.1 kg   SpO2 99%   BMI 28.89 kg/m   Physical Exam Vitals signs and nursing note reviewed.  Constitutional:      General: He is not in acute distress.    Appearance: He is well-developed.  HENT:     Head: Normocephalic and atraumatic.  Eyes:     General:        Right eye: No discharge.        Left eye: No  discharge.     Conjunctiva/sclera: Conjunctivae normal.  Neck:     Musculoskeletal: Neck supple.  Cardiovascular:     Rate and Rhythm: Normal rate and regular rhythm.  Heart sounds: Normal heart sounds. No murmur. No friction rub. No gallop.   Pulmonary:     Effort: Pulmonary effort is normal. No respiratory distress.     Breath sounds: Normal breath sounds.  Abdominal:     General: There is no distension.     Palpations: Abdomen is soft.     Tenderness: There is no abdominal tenderness.  Musculoskeletal:        General: No tenderness.  Skin:    General: Skin is warm and dry.  Neurological:     Mental Status: He is alert.  Psychiatric:        Behavior: Behavior normal.        Thought Content: Thought content normal.        ED Treatments / Results  Labs (all labs ordered are listed, but only abnormal results are displayed) Labs Reviewed - No data to display  EKG None  Radiology No results found.  Procedures .Foreign Body Removal Date/Time: 04/14/2018 10:15 AM Performed by: Raeford Razor, MD Authorized by: Raeford Razor, MD  Consent: Verbal consent obtained. Consent given by: patient Patient identity confirmed: verbally with patient and provided demographic data Body area: skin General location: upper extremity Location details: right elbow Anesthesia: local infiltration  Anesthesia: Local Anesthetic: lidocaine 1% without epinephrine Anesthetic total: 4 mL  Sedation: Patient sedated: no  Patient restrained: no Patient cooperative: yes Localization method: probed and visualized Removal mechanism: forceps and irrigation Dressing: antibiotic ointment and dressing applied Tendon involvement: none 4 objects recovered. Objects recovered: 4 small pieces of what appeared to be organic debris, possibly flakes of pine bark mulch removed. Numerous additional tiny flecks removed when irrigating.  Post-procedure assessment: foreign body removed Patient  tolerance: Patient tolerated the procedure well with no immediate complications   (including critical care time)  Medications Ordered in ED Medications  lidocaine (PF) (XYLOCAINE) 1 % injection 5 mL (has no administration in time range)     Initial Impression / Assessment and Plan / ED Course  I have reviewed the triage vital signs and the nursing notes.  Pertinent labs & imaging results that were available during my care of the patient were reviewed by me and considered in my medical decision making (see chart for details).     67yM with recent repaired laceration as pictured. Unfortunately it has become infected. Some purulent drainage from wound and surrounding cellulitis. Staples removed. It had what appeared to be a few small pieces of pine bark mulch.  They were removed and wound was copiously irrigated.  Left open for wet to dry dressing changes. Abx changed to clindamycin. Continued wound care and return precautions discussed. I do not think there is signifcant role for maging at this time.   Final Clinical Impressions(s) / ED Diagnoses   Final diagnoses:  Infected laceration  Superficial foreign body of right elbow, initial encounter    ED Discharge Orders    None       Raeford Razor, MD 04/14/18 [id number]

## 2018-04-14 NOTE — ED Triage Notes (Signed)
Has been having redness and swelling with drainage from right elbow. Has had 4 doses of Keflex

## 2018-04-14 NOTE — Telephone Encounter (Signed)
Patient called concerning electronic prescription not being received by patient's pharmacy. ED CM noted clindamycin prescription in Epic. ED CM discussed with EDP. Prescription was called in for Clindamycin 300mg  tab 3x daily x7 days no refills to [facility] on [address] in [place] Pharmacist read back and patient was notified to check with [facility]. No further ED CM needs noted.

## 2018-04-16 ENCOUNTER — Emergency Department (HOSPITAL_BASED_OUTPATIENT_CLINIC_OR_DEPARTMENT_OTHER): Payer: PRIVATE HEALTH INSURANCE

## 2018-04-16 ENCOUNTER — Other Ambulatory Visit: Payer: Self-pay

## 2018-04-16 ENCOUNTER — Inpatient Hospital Stay (HOSPITAL_BASED_OUTPATIENT_CLINIC_OR_DEPARTMENT_OTHER)
Admission: EM | Admit: 2018-04-16 | Discharge: 2018-04-19 | DRG: 603 | Disposition: A | Discharge status: Home / Self Care | Payer: PRIVATE HEALTH INSURANCE | Attending: Internal Medicine | Admitting: Internal Medicine

## 2018-04-16 ENCOUNTER — Encounter (HOSPITAL_BASED_OUTPATIENT_CLINIC_OR_DEPARTMENT_OTHER): Payer: Self-pay | Admitting: Emergency Medicine

## 2018-04-16 DIAGNOSIS — Z9641 Presence of insulin pump (external) (internal): Secondary | ICD-10-CM | POA: Diagnosis present

## 2018-04-16 DIAGNOSIS — L03113 Cellulitis of right upper limb: Secondary | ICD-10-CM | POA: Diagnosis present

## 2018-04-16 DIAGNOSIS — H547 Unspecified visual loss: Secondary | ICD-10-CM | POA: Diagnosis present

## 2018-04-16 DIAGNOSIS — Z794 Long term (current) use of insulin: Secondary | ICD-10-CM

## 2018-04-16 DIAGNOSIS — S50909A Unspecified superficial injury of unspecified elbow, initial encounter: Secondary | ICD-10-CM

## 2018-04-16 DIAGNOSIS — E1040 Type 1 diabetes mellitus with diabetic neuropathy, unspecified: Secondary | ICD-10-CM | POA: Diagnosis present

## 2018-04-16 DIAGNOSIS — Z7902 Long term (current) use of antithrombotics/antiplatelets: Secondary | ICD-10-CM

## 2018-04-16 DIAGNOSIS — S51011D Laceration without foreign body of right elbow, subsequent encounter: Secondary | ICD-10-CM

## 2018-04-16 DIAGNOSIS — S50919A Unspecified superficial injury of unspecified forearm, initial encounter: Secondary | ICD-10-CM

## 2018-04-16 DIAGNOSIS — Z87891 Personal history of nicotine dependence: Secondary | ICD-10-CM

## 2018-04-16 DIAGNOSIS — Z7982 Long term (current) use of aspirin: Secondary | ICD-10-CM | POA: Diagnosis not present

## 2018-04-16 DIAGNOSIS — M19042 Primary osteoarthritis, left hand: Secondary | ICD-10-CM | POA: Diagnosis present

## 2018-04-16 DIAGNOSIS — I10 Essential (primary) hypertension: Secondary | ICD-10-CM | POA: Diagnosis present

## 2018-04-16 DIAGNOSIS — Z79899 Other long term (current) drug therapy: Secondary | ICD-10-CM | POA: Diagnosis not present

## 2018-04-16 DIAGNOSIS — T148XXA Other injury of unspecified body region, initial encounter: Secondary | ICD-10-CM | POA: Diagnosis present

## 2018-04-16 DIAGNOSIS — S60911A Unspecified superficial injury of right wrist, initial encounter: Secondary | ICD-10-CM | POA: Diagnosis not present

## 2018-04-16 DIAGNOSIS — Z802 Family history of malignant neoplasm of other respiratory and intrathoracic organs: Secondary | ICD-10-CM

## 2018-04-16 DIAGNOSIS — G4733 Obstructive sleep apnea (adult) (pediatric): Secondary | ICD-10-CM | POA: Diagnosis present

## 2018-04-16 DIAGNOSIS — E1065 Type 1 diabetes mellitus with hyperglycemia: Secondary | ICD-10-CM | POA: Diagnosis present

## 2018-04-16 DIAGNOSIS — M19041 Primary osteoarthritis, right hand: Secondary | ICD-10-CM | POA: Diagnosis present

## 2018-04-16 DIAGNOSIS — L089 Local infection of the skin and subcutaneous tissue, unspecified: Secondary | ICD-10-CM | POA: Diagnosis present

## 2018-04-16 DIAGNOSIS — Z801 Family history of malignant neoplasm of trachea, bronchus and lung: Secondary | ICD-10-CM

## 2018-04-16 DIAGNOSIS — S50901A Unspecified superficial injury of right elbow, initial encounter: Secondary | ICD-10-CM | POA: Diagnosis not present

## 2018-04-16 DIAGNOSIS — E785 Hyperlipidemia, unspecified: Secondary | ICD-10-CM | POA: Diagnosis present

## 2018-04-16 DIAGNOSIS — E890 Postprocedural hypothyroidism: Secondary | ICD-10-CM | POA: Diagnosis present

## 2018-04-16 DIAGNOSIS — E039 Hypothyroidism, unspecified: Secondary | ICD-10-CM | POA: Diagnosis not present

## 2018-04-16 DIAGNOSIS — Z9842 Cataract extraction status, left eye: Secondary | ICD-10-CM

## 2018-04-16 DIAGNOSIS — I2510 Atherosclerotic heart disease of native coronary artery without angina pectoris: Secondary | ICD-10-CM | POA: Diagnosis present

## 2018-04-16 DIAGNOSIS — K219 Gastro-esophageal reflux disease without esophagitis: Secondary | ICD-10-CM | POA: Diagnosis present

## 2018-04-16 DIAGNOSIS — E871 Hypo-osmolality and hyponatremia: Secondary | ICD-10-CM | POA: Diagnosis present

## 2018-04-16 DIAGNOSIS — E1051 Type 1 diabetes mellitus with diabetic peripheral angiopathy without gangrene: Secondary | ICD-10-CM | POA: Diagnosis present

## 2018-04-16 DIAGNOSIS — E10319 Type 1 diabetes mellitus with unspecified diabetic retinopathy without macular edema: Secondary | ICD-10-CM | POA: Diagnosis present

## 2018-04-16 DIAGNOSIS — E1069 Type 1 diabetes mellitus with other specified complication: Secondary | ICD-10-CM | POA: Diagnosis not present

## 2018-04-16 DIAGNOSIS — Z8673 Personal history of transient ischemic attack (TIA), and cerebral infarction without residual deficits: Secondary | ICD-10-CM

## 2018-04-16 DIAGNOSIS — Z953 Presence of xenogenic heart valve: Secondary | ICD-10-CM

## 2018-04-16 DIAGNOSIS — Z951 Presence of aortocoronary bypass graft: Secondary | ICD-10-CM

## 2018-04-16 DIAGNOSIS — Z91048 Other nonmedicinal substance allergy status: Secondary | ICD-10-CM

## 2018-04-16 DIAGNOSIS — Z888 Allergy status to other drugs, medicaments and biological substances status: Secondary | ICD-10-CM

## 2018-04-16 DIAGNOSIS — Z8701 Personal history of pneumonia (recurrent): Secondary | ICD-10-CM

## 2018-04-16 DIAGNOSIS — Z79890 Hormone replacement therapy (postmenopausal): Secondary | ICD-10-CM

## 2018-04-16 DIAGNOSIS — W19XXXD Unspecified fall, subsequent encounter: Secondary | ICD-10-CM | POA: Diagnosis present

## 2018-04-16 DIAGNOSIS — S50911A Unspecified superficial injury of right forearm, initial encounter: Secondary | ICD-10-CM | POA: Diagnosis not present

## 2018-04-16 DIAGNOSIS — S60919A Unspecified superficial injury of unspecified wrist, initial encounter: Secondary | ICD-10-CM | POA: Insufficient documentation

## 2018-04-16 DIAGNOSIS — Z808 Family history of malignant neoplasm of other organs or systems: Secondary | ICD-10-CM

## 2018-04-16 LAB — CBC WITH DIFFERENTIAL/PLATELET
Abs Immature Granulocytes: 0.03 10*3/uL (ref 0.00–0.07)
Basophils Absolute: 0 10*3/uL (ref 0.0–0.1)
Basophils Relative: 0 %
Eosinophils Absolute: 0.3 10*3/uL (ref 0.0–0.5)
Eosinophils Relative: 4 %
HCT: 38.1 % — ABNORMAL LOW (ref 39.0–52.0)
Hemoglobin: 12.5 g/dL — ABNORMAL LOW (ref 13.0–17.0)
Immature Granulocytes: 0 %
Lymphocytes Relative: 13 %
Lymphs Abs: 1 10*3/uL (ref 0.7–4.0)
MCH: 30.5 pg (ref 26.0–34.0)
MCHC: 32.8 g/dL (ref 30.0–36.0)
MCV: 92.9 fL (ref 80.0–100.0)
Monocytes Absolute: 0.6 10*3/uL (ref 0.1–1.0)
Monocytes Relative: 8 %
Neutro Abs: 5.8 10*3/uL (ref 1.7–7.7)
Neutrophils Relative %: 75 %
Platelets: 240 10*3/uL (ref 150–400)
RBC: 4.1 MIL/uL — ABNORMAL LOW (ref 4.22–5.81)
RDW: 12 % (ref 11.5–15.5)
WBC: 7.9 10*3/uL (ref 4.0–10.5)
nRBC: 0 % (ref 0.0–0.2)

## 2018-04-16 LAB — BASIC METABOLIC PANEL
Anion gap: 9 (ref 5–15)
BUN: 20 mg/dL (ref 8–23)
CO2: 25 mmol/L (ref 22–32)
Calcium: 8.3 mg/dL — ABNORMAL LOW (ref 8.9–10.3)
Chloride: 100 mmol/L (ref 98–111)
Creatinine, Ser: 1.19 mg/dL (ref 0.61–1.24)
GFR calc Af Amer: >60 mL/min (ref >60)
GFR calc non Af Amer: >60 mL/min (ref >60)
Glucose, Bld: 276 mg/dL — ABNORMAL HIGH (ref 70–99)
Potassium: 4.1 mmol/L (ref 3.5–5.1)
Sodium: 134 mmol/L — ABNORMAL LOW (ref 135–145)

## 2018-04-16 LAB — LACTIC ACID, PLASMA: Lactic Acid, Venous: 1 mmol/L (ref 0.5–1.9)

## 2018-04-16 LAB — CBG MONITORING, ED: Glucose-Capillary: 249 mg/dL — ABNORMAL HIGH (ref 70–99)

## 2018-04-16 LAB — GLUCOSE, CAPILLARY
Glucose-Capillary: 277 mg/dL — ABNORMAL HIGH (ref 70–99)
Glucose-Capillary: 284 mg/dL — ABNORMAL HIGH (ref 70–99)

## 2018-04-16 MED ORDER — VANCOMYCIN HCL 10 G IV SOLR
2000.0000 mg | Freq: Once | INTRAVENOUS | Status: DC
  Filled 2018-04-16: qty 2000

## 2018-04-16 MED ORDER — INSULIN PUMP
Freq: Three times a day (TID) | SUBCUTANEOUS | Status: DC
  Administered 2018-04-16 – 2018-04-17 (×3): via SUBCUTANEOUS
  Administered 2018-04-17: 3 via SUBCUTANEOUS
  Administered 2018-04-17: 22:00:00 via SUBCUTANEOUS
  Administered 2018-04-17: 4 via SUBCUTANEOUS
  Administered 2018-04-17: 02:00:00 via SUBCUTANEOUS
  Administered 2018-04-18: 7.1 via SUBCUTANEOUS
  Administered 2018-04-18: 02:00:00 via SUBCUTANEOUS
  Administered 2018-04-18: 8.25 via SUBCUTANEOUS
  Administered 2018-04-18: 1.575 via SUBCUTANEOUS

## 2018-04-16 MED ORDER — INSULIN ASPART 100 UNIT/ML ~~LOC~~ SOLN: 0.0000 [IU] | Freq: Every day | SUBCUTANEOUS | Status: DC

## 2018-04-16 MED ORDER — VANCOMYCIN HCL 1000 MG IV SOLR: 1000.0000 mg | Freq: Once | INTRAVENOUS | Status: DC

## 2018-04-16 MED ORDER — VANCOMYCIN HCL IN DEXTROSE 1-5 GM/200ML-% IV SOLN
INTRAVENOUS | Status: AC
  Filled 2018-04-16: qty 200

## 2018-04-16 MED ORDER — INSULIN ASPART 100 UNIT/ML ~~LOC~~ SOLN
0.0000 [IU] | Freq: Three times a day (TID) | SUBCUTANEOUS | Status: DC
  Administered 2018-04-17: 3 [IU] via SUBCUTANEOUS

## 2018-04-16 MED ORDER — PIPERACILLIN-TAZOBACTAM 3.375 G IVPB 30 MIN
3.3750 g | Freq: Once | INTRAVENOUS | Status: AC
  Administered 2018-04-16: 10:00:00 3.375 g via INTRAVENOUS
  Filled 2018-04-16 (×2): qty 50

## 2018-04-16 MED ORDER — SODIUM CHLORIDE 0.9 % IV BOLUS
500.0000 mL | Freq: Once | INTRAVENOUS | Status: AC
  Administered 2018-04-16: 500 mL via INTRAVENOUS

## 2018-04-16 MED ORDER — VANCOMYCIN HCL IN DEXTROSE 750-5 MG/150ML-% IV SOLN
750.0000 mg | Freq: Two times a day (BID) | INTRAVENOUS | Status: DC
  Administered 2018-04-16: 21:00:00 750 mg via INTRAVENOUS
  Filled 2018-04-16 (×3): qty 150

## 2018-04-16 MED ORDER — SODIUM CHLORIDE 0.9 % IV SOLN
INTRAVENOUS | Status: DC
  Administered 2018-04-16 – 2018-04-18 (×3): via INTRAVENOUS

## 2018-04-16 MED ORDER — VANCOMYCIN HCL 1000 MG IV SOLR
INTRAVENOUS | Status: AC
  Administered 2018-04-16: 10:00:00 2000 mg
  Filled 2018-04-16: qty 2000

## 2018-04-16 MED ORDER — PIPERACILLIN-TAZOBACTAM 3.375 G IVPB
3.3750 g | Freq: Three times a day (TID) | INTRAVENOUS | Status: DC
  Administered 2018-04-16 – 2018-04-19 (×8): 3.375 g via INTRAVENOUS
  Filled 2018-04-16 (×8): qty 50

## 2018-04-16 NOTE — ED Provider Notes (Addendum)
[facility] EMERGENCY DEPARTMENT Provider Note   CSN: [id number] Arrival date & time: 04/16/18  6195    History   Chief Complaint Chief Complaint  Patient presents with  . Cellulitis    right arm    HPI [name] is a 68 y.o. male.     Patient status post injury to the right elbow with a fall at home outside.  Tetanus up-to-date.  This occurred on April 8.  Patient was seen wound was repaired.  Patient started on Keflex the day after.  Patient seen in the emergency department on April 11 noted to have wound infection.  Staples were removed.  Patient started on clindamycin.  Started the antibiotic that day.  Patient returns today with concerns for increased forearm and hand swelling.  States that the elbow area not significantly worse than what it was on Saturday, April 11.  However there has been a purulent discharge from the wound.  Denies any forearm or hand pain.  Did have fevers early on but no fevers recently.  Patient is a type I diabetic with an insulin pump.  States that his blood sugars have been running higher than usual and has had to make adjustments with his pump.  Patient also has heart valve replacement with a bovine valve.  Patient is also had right carotid artery stenting.  Patient is diabetic complications include diabetic retinopathy and diabetic neuropathy.  Patient is also had a TIA in the past.  Patient known to have peripheral artery disease.  The valve that was replaced was the aortic valve.     Past Medical History:  Diagnosis Date  . Abnormal stress test 10/27/2011   lat isch--cardiac cath 10/28/2011  . Aortic stenosis, moderate Sept/Oct /2013   2017-> s/p bioprosthetic AV [facility] Ease bovine pericardial valve model 3300 TFX, ser # [id number]).  . 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 Inferolateral perfusion defect + EKG abnormality during stress testing prompted Cath by [facility]: 3V CAD, EF normal.  Med mgmt recommended; b. 06/2015 Cath: 3VD  and mod AS; c. 07/2015 CABG x 3 (LIMA->LAD, VG->OM2, VG->PDA) w/ AVR.  [name] Carotid stenosis, right 09/2011   Dr. [name] did R carotid stenting 01/2014.  Carotid dopplers 06/2015 essentially normal.  No change 07/2016--repeat 1 yr.  . Complication of anesthesia    difficulty with intubation,   . Decreased pedal pulses    LE doppler 11/08/11- no evidence of arterial insufficiency  . Diabetes mellitus Dx'd age 73   Novolog via insulin pump; sub-optimal control x years  . Diabetic neuropathy (HCC)    fine touch and position sense affected  . Diabetic retinopathy    Proliferative: Hx of retinal detachment on right-light perception only in right eye  . Diastolic dysfunction 2007; 2016   TEE with diastolic dysfunction, EF 74%.  . Difficult intubation    ~ 2002 difficult fiberoptic intubation with takeback bleeding s/p thyroidectomy;  for thyroidectomy was intubated DL X 1 with cricoid pressure but difficult mask (full beard)  . Dizziness    feels off balance  . Erectile dysfunction    [place]  . GERD (gastroesophageal reflux disease)   . History of tobacco abuse    Quit about 1990 (has 35 pack-yr hx)  . Hyperlipemia, mixed    a. intolerant of statins/zeta; per Dr. [name] 12/2016, pt started on PCSK9--incomplete responder?---01/2017.  [name] Hypothyroidism, postsurgical    Thyroidectomy 2002; multinodular goiter.  Dr. Elvera Lennox managing this as  of 10/2015.  . Impaired vision    right eye light perception only; hx of retinal detachment.  . Impingement syndrome of right shoulder    08/26/15 Pt got subacromial steroid injection by orthopedist (Dr. Delfin Edis).  [facility] f/u 01/2016--MRI showed RC tear + AC joint arthritis, arthroscopic surgery done 03/2016 (ortho-).  . Neuromuscular disorder (HCC)    neuropathy  . OSA (obstructive sleep apnea)    06/2011 sleep study: moderate OSA, CPAP at 12 cm H2O.  . Osteoarthritis    Bilat thumb carpometacarpal joints.  Ortho injected each  thumb x 2 in 2017.  Also injected 03/14/17.  [name] Recurrent pneumonia   . Shortness of breath dyspnea   . Stroke [facility])    TIA  . TIA (transient ischemic attack) 2015/16   with R carotid dz: pt got R carotid stent 01/2014 by Dr. [name] and was put on dual antiplatelet therapy with ASA  and plavix  qd afterwards  . TIA (transient ischemic attack) 05/2015   Left brain (right sided numbness + slurred speech)  Admitted for obs/workup 05/2015.  [name] Venous insufficiency   . Venous reflux 10/14/2011   venous doppler-R GSV continuous reflux throughout; too small for VNUS closure    Patient Active Problem List   Diagnosis Date Noted  . Statin intolerance 01/18/2017  . S/P CABG x 3 07/14/2015  . S/P AVR 07/09/2015  . Numbness and tingling of right arm and leg 05/06/2015  . Polypharmacy 04/09/2014  . Carotid occlusion, right 01/16/2014  . Carotid stenosis   . Stroke (HCC)   . TIA (transient ischemic attack) 01/12/2014  . Pre-syncope 05/13/2013  . Essential hypertension 03/18/2013  . Trochanteric bursitis of left hip 01/18/2013  . Preventative health care 01/18/2013  . OSA (obstructive sleep apnea)   . Mild cognitive impairment 05/09/2012  . Poorly controlled type 1 diabetes mellitus with circulatory disorder (HCC) 05/09/2012  . CAD (coronary artery disease) 01/11/2012  . Abnormal nuclear cardiac imaging test, 10/2011 10/28/2011  . PAD (peripheral artery disease) (HCC) 09/28/2011  . 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 Aortic stenosis, moderate 09/28/2011  . Carotid stenosis, right 09/28/2011  . Colon cancer screening 04/21/2011  . Fatigue 04/21/2011  . Hyperlipidemia 03/21/2011  . Diabetic peripheral neuropathy (HCC) 01/19/2011  . Restless legs syndrome 01/19/2011  . Hypothyroidism 01/13/2011    Past Surgical History:  Procedure Laterality Date  . ABI  01/2016   Normal.  Dr. [name] to repeat in 1 yr.  . AORTIC VALVE REPLACEMENT N/A 07/09/2015   Bovine pericardial valve.   Procedure: AORTIC VALVE REPLACEMENT (AVR);  Surgeon: Loreli Slot, MD;  Location[facility] OR;  Service: Open Heart Surgery;  Laterality: N/A;  . CARDIAC CATHETERIZATION  10/2011   EF normal.  Diffuse 3 vessel CAD, no stents placed.  Medical mgmt per [facility].  [name] CARDIAC CATHETERIZATION N/A 06/22/2015   Procedure: Right/Left Heart Cath and Coronary Angiography;  Surgeon: Runell Gess, MD;  Location: [facility] INVASIVE CV LAB;  Service: Cardiovascular;  Laterality: N/A;  . carotid dopplers  06/30/15   Normal: repeat when clinically indicated per Dr. [name]  . CAROTID STENT INSERTION Right 01/16/2014   Procedure: CAROTID STENT INSERTION;  Surgeon: Runell Gess, MD;  Location[facility] CATH LAB;  Service: Cardiovascular;  Laterality: Right;  . CATARACT EXTRACTION     left  . CORONARY ARTERY BYPASS GRAFT N/A 07/09/2015   Procedure: CORONARY ARTERY BYPASS GRAFTING (CABG)TIMES THREE USING LEFT INTERNAL MAMMARY ARTERY AND LEFT SAPHENOUS LEG VEIN  HARVESTED ENDOSCOPICALLY;  Surgeon: Loreli Slot, MD;  Location: [facility] OR;  Service: Open Heart Surgery;  Laterality: N/A;  . EYE SURGERY     Multiple laser surgeries for diabetic retinopathy, also cataract surgery OU.  [name] INTRAOPERATIVE TRANSESOPHAGEAL ECHOCARDIOGRAM N/A 07/09/2015   Procedure: INTRAOPERATIVE TRANSESOPHAGEAL ECHOCARDIOGRAM;  Surgeon: Loreli Slot, MD;  Location: [facility] OR;  Service: Open Heart Surgery;  Laterality: N/A;  . lasix eye    . LEFT AND RIGHT HEART CATHETERIZATION WITH CORONARY ANGIOGRAM N/A 10/28/2011   Procedure: LEFT AND R0IHT HEART CATHETERIZATION WITH CORONARY ANGIOGRAM;  Surgeon: Lennette Bihari, MD;  Location: [facility] CATH LAB;  Service: Cardiovascular;  Laterality: N/A;  . RETINAL DETACHMENT SURGERY     Right eye  . ROTATOR CUFF REPAIR W/ DISTAL CLAVICLE EXCISION Right 03/2016   Arthroscopic ([facility])  . THYROID SURGERY  2002  . TRANSTHORACIC ECHOCARDIOGRAM  10/2011;12/2012;01/2015; 08/2015; 07/28/16   Mod AS, mild LVH, EF  60-65%, no RWMA.  01/2015 EF 60-65%, grade I DD, progression of mod/sev AS.  08/2015 normal lv fxn with normal fxning bioprosthetic Ao valve.  07/2016--no change compared to 2017 echo.          Home Medications    Prior to Admission medications   Medication Sig Start Date End Date Taking? Authorizing Provider  aspirin EC 81 MG tablet Take 81 mg by mouth daily.    [provider]  clindamycin (CLEOCIN) 300 MG capsule Take 1 capsule (300 mg total) by mouth 3 (three) times daily. 04/14/18   Raeford Razor, MD  clopidogrel (PLAVIX) 75 MG tablet TAKE 1 TABLET BY MOUTH EVERY DAY 07/04/16   Runell Gess, MD  clotrimazole-betamethasone (LOTRISONE) cream Apply 1 application topically 2 (two) times daily. 08/25/16   McGowen, Maryjean Morn, MD  Evolocumab with Infusor (REPATHA PUSHTRONEX SYSTEM) 420 MG/3.5ML SOCT Inject 1 Dose into the skin every 30 (thirty) days. 01/23/17   Hilty, Lisette Abu, MD  FEVERFEW PO Take 1 capsule by mouth daily. To prevent migraines    [provider]  Insulin Human (INSULIN PUMP) SOLN Inject into the skin continuous. Basal rate .95/hr, current pump settings: 12am .7, 2am 1, 7am 1.1, 11am .9, 5pm .9.25, 11pm .85. Uses Novolog Insulin.    [provider]  insulin lispro (HUMALOG) 100 UNIT/ML injection Use 60 units via insulin pump. 08/01/16   Carlus Pavlov, MD  levothyroxine (SYNTHROID, LEVOTHROID) 150 MCG tablet Take 1 tablet by mouth daily. 08/30/17   [provider]  Multiple Vitamins-Minerals (ONE-A-DAY MENS 50+ ADVANTAGE) TABS Take 1 tablet by mouth daily with breakfast.    [provider]  omeprazole (PRILOSEC OTC) 20 MG tablet Take 20 mg by mouth daily.    [provider]  tadalafil (CIALIS) 20 MG tablet Take 1 tablet (20 mg total) by mouth daily as needed for erectile dysfunction. 10/23/15   Carlus Pavlov, MD    Family History Family History  Problem Relation Age of Onset  . Cancer Mother        lung cancer  .  Alcohol abuse Father   . Cancer Father        laryngeal cancer  . Cancer Brother        oldest brother had lung cancer and melanoma    Social History Social History   Tobacco Use  . Smoking status: Former Games developer  . Smokeless tobacco: Former Neurosurgeon    Quit date: 06/22/1987  Substance Use Topics  . Alcohol use: Yes    Alcohol/week: 0.0  standard drinks    Comment: occasional  . Drug use: No     Allergies   Rosuvastatin calcium; Statins; and Tape   Review of Systems Review of Systems  Constitutional: Positive for fever. Negative for chills.  HENT: Negative for congestion, ear pain, rhinorrhea and sore throat.   Eyes: Negative for pain and visual disturbance.  Respiratory: Negative for cough and shortness of breath.   Cardiovascular: Negative for chest pain, palpitations and leg swelling.  Gastrointestinal: Negative for abdominal pain, diarrhea, nausea and vomiting.  Genitourinary: Negative for dysuria and hematuria.  Musculoskeletal: Positive for joint swelling. Negative for arthralgias, back pain, neck pain and neck stiffness.  Skin: Positive for wound. Negative for color change and rash.  Allergic/Immunologic: Positive for immunocompromised state.  Neurological: Negative for dizziness, seizures, syncope, light-headedness and headaches.  Hematological: Does not bruise/bleed easily.  Psychiatric/Behavioral: Negative for confusion.  All other systems reviewed and are negative.    Physical Exam Updated Vital Signs BP (!) 141/71 (BP Location: Left Arm)   Pulse 81   Temp 98 F (36.7 C) (Oral)   Resp 14   Ht 1.88 m ( )   Wt 102.1 kg   SpO2 98%   BMI 28.89 kg/m   Physical Exam Vitals signs and nursing note reviewed.  Constitutional:      General: He is not in acute distress.    Appearance: Normal appearance. He is well-developed.  HENT:     Head: Normocephalic and atraumatic.     Nose: No congestion.  Eyes:     Extraocular Movements: Extraocular movements  intact.     Conjunctiva/sclera: Conjunctivae normal.     Pupils: Pupils are equal, round, and reactive to light.  Neck:     Musculoskeletal: Normal range of motion and neck supple.  Cardiovascular:     Rate and Rhythm: Normal rate and regular rhythm.     Heart sounds: No murmur.  Pulmonary:     Effort: Pulmonary effort is normal. No respiratory distress.     Breath sounds: Normal breath sounds.  Abdominal:     Palpations: Abdomen is soft.     Tenderness: There is no abdominal tenderness.  Musculoskeletal:        General: Swelling and tenderness present.     Comments: Elliptical skin wound right elbow probably measuring about 6 cm.  With purulent discharge some proximal erythema extending to about mid arm posteriorly.  Swelling to the forearm and hand but no tenderness to palpation no erythema.  Radial pulse is 2+.  Good movement of fingers without any pain.  Sensation grossly intact.  Good cap refill.  Only mild tenderness to palpation to the wound in the proximal posterior part of the arm.  Skin:    General: Skin is warm and dry.     Capillary Refill: Capillary refill takes less than 2 seconds.  Neurological:     General: No focal deficit present.     Mental Status: He is alert and oriented to person, place, and time.            ED Treatments / Results  Labs (all labs ordered are listed, but only abnormal results are displayed) Labs Reviewed  CULTURE, BLOOD (ROUTINE X 2)  CULTURE, BLOOD (ROUTINE X 2)  LACTIC ACID, PLASMA  CBC WITH DIFFERENTIAL/PLATELET  BASIC METABOLIC PANEL  CBG MONITORING, ED    EKG None  Radiology No results found.  Procedures Procedures (including critical care time)  Medications Ordered in ED Medications  0.9 %  sodium chloride infusion (has no administration in time range)  sodium chloride 0.9 % bolus 500 mL (has no administration in time range)  piperacillin-tazobactam (ZOSYN) IVPB 3.375 g (has no administration in time range)   vancomycin (VANCOCIN) 1,000 mg in sodium chloride 0.9 % 250 mL IVPB (has no administration in time range)     Initial Impression / Assessment and Plan / ED Course  I have reviewed the triage vital signs and the nursing notes.  Pertinent labs & imaging results that were available during my care of the patient were reviewed by me and considered in my medical decision making (see chart for details).       X-ray of the right elbow and labs without any significant abnormalities other than some hyperglycemia but a blood sugar of 250 is reasonable for the patient.  Patient started on Zosyn and vancomycin since he is already been on Keflex and then switched to clindamycin.  Will discuss with hand surgery clinically feel as if patient would benefit with a medical admission and continued IV antibiotics and consultation by hand surgery.  I have included some pictures in the chart to show the wound.  And the forearm swelling.     Final Clinical Impressions(s) / ED Diagnoses   Final diagnoses:  Wound infection  Cellulitis of right upper extremity  Type 1 diabetes mellitus with hyperglycemia [facility](HCC)    ED Discharge Orders    None       Vanetta MuldersZackowski, Genevieve Arbaugh, MD 04/16/18 1016  Patient will be [facility] Dr. Antony OdeaLucio from orthopedics will consult.  Patient started on antibiotics to cover more serious infection including MRSA.  Patient's forearm now is getting erythematous suggestive of cellulitis in that area as well.  It was just edematous before.  Patient still nontoxic no acute distress.       Vanetta MuldersZackowski, Kiri Hinderliter, MD 04/16/18 1141

## 2018-04-16 NOTE — Progress Notes (Signed)
Inpatient Diabetes Program Recommendations  AACE/ADA: New Consensus Statement on Inpatient Glycemic Control (2015)  Target Ranges:  Prepandial:   less than 140 mg/dL      Peak postprandial:   less than 180 mg/dL (1-2 hours)      Critically ill patients:  140 - 180 mg/dL   Lab Results  Component Value Date   GLUCAP 249 (H) 04/16/2018   HGBA1C 8.0 09/11/2015    Review of Glycemic Control  Diabetes history: Type 1 Outpatient Diabetes medications: Insulin pump Current orders for Inpatient glycemic control: Insulin pump, Novolog 0-9 units tidwc and hs  Need updated HgbA1C. Last one - 9% on 08/30/2017  Pump settings: Will obtain from pt/pump.  Inpatient Diabetes Program Recommendations:     D/C Novolog 0-9 units tidwc and hs with insulin pump.  Need updated HgbA1C to assess glycemic control prior to admission.  Spoke with RN regarding insulin pump order set. Secure chat with MD.  Thank you. Ailene Ards, RD, LDN, CDE Inpatient Diabetes Coordinator [phone]

## 2018-04-16 NOTE — ED Triage Notes (Signed)
Pt presents with obvious wound infection , had staples in right elbow area. Obvious swelling and redness. Painful to touch. Hx diabetes. No fever.

## 2018-04-16 NOTE — ED Notes (Signed)
Attempt to report  call [facility] no answer

## 2018-04-16 NOTE — ED Notes (Signed)
Borders marked and wet to dry drg applied to right elbow

## 2018-04-16 NOTE — ED Notes (Signed)
Pt given water and warm blanket  

## 2018-04-16 NOTE — Progress Notes (Addendum)
Pharmacy Antibiotic Note  [name] is a 68 y.o. male admitted on 04/16/2018 with wound infection on rt elbow.  Pharmacy has been consulted for zosyn dosing.  Plan: Zosyn 3.375g IV Q8H infused over 4hrs. Pharmacy will sign off and follow peripherally Continue vancomycin per previous note (J. Oriet Pharm D.)  Height: 6\' 2"  (188 cm) Weight: 225 lb (102.1 kg) IBW/kg (Calculated) : 82.2  Temp (24hrs), Avg:98.3 F (36.8 C), Min:98 F (36.7 C), Max:98.8 F (37.1 C)  Recent Labs  Lab 04/16/18 0911 04/16/18 0927  WBC 7.9  --   CREATININE 1.19  --   LATICACIDVEN  --  1.0    Estimated Creatinine Clearance: 74.7 mL/min (by C-G formula based on SCr of 1.19 mg/dL).    Allergies  Allergen Reactions  . Rosuvastatin Calcium Other (See Comments)    Problem with higher dosages (Lipitor caused memory issues), also caused leg pains and lethargy  . Statins Other (See Comments)    Problem with higher dosages (Lipitor caused memory issues), also caused leg pains and lethargy  . Tape Rash and Other (See Comments)    Adhesive tape.  (Paper tape OK)    Antimicrobials this admission: 4/13 vanc >> 4/13 zosyn >> Dose adjustments this admission:     Thank you for allowing pharmacy to be a part of this patient's care.  Arley Phenix RPh 04/16/2018, 4:56 PM Pager [phone]

## 2018-04-16 NOTE — H&P (Signed)
History and Physical    Nathaniel PepperRichard L Burlison ZOX:[id number] DOB: 01/04/1950 DOA: 04/16/2018  PCP: Bailey MechPodraza, Cole Christopher, PA-C   Patient coming from: [facility]? [facility] I have personally briefly reviewed patient's old medical records [facility]  Chief Complaint:   HPI: [name] is a 68 y.o. male with medical history significant of  Aortic stenosis, s/p bioprosthetic AV, multiple vessel CAD, carotid artery stenting, DM, Diabetic Neuropathy, GERD, hypothyroidism, hyperlipidemia, OSA, TIA, Stroke reports [place] bushes [place] hit his right elbow on the [place] on Wednesday, [facility], was discharged on keflex. Returned on 11 th noted to have wound infection, and discharged on clindamycin. Despite being on oral meds pt reports his pain and swelling hasn't improved.but the erythema  has improved. [facility]. Pt reports low grade temp, with T max of  100.2, with some chills. No nausea, vomiting, abd pain, headache, diarrhea, chest pain, sob  , cough . He was referred to medical service for admission for broad spectrum antibiotics.   ED Course: on arrival to ED, he was found to be afebrile and normotensive, labs show mild hyponatremia, glucose of 276, and hemoglobin of 12.5,lactic acid of 1. Blood cultures were done and elbow x rays show soft tissue swelling. He was referred to medical service for evaluation.   Review of Systems: As per HPI otherwise 10 point review of systems negative.   Past Medical History:  Diagnosis Date  . Abnormal stress test 10/27/2011   lat isch--cardiac cath 10/28/2011  . Aortic stenosis, moderate Sept/Oct /2013   2017-> s/p bioprosthetic AV [facility](Edwards Magna Ease bovine pericardial valve model 3300 TFX, ser # [id number]).  . 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 Inferolateral perfusion defect + EKG abnormality during stress testing prompted Cath by [facility]:  3V CAD, EF normal.  Med mgmt recommended; b. 06/2015 Cath: 3VD and mod AS; c. 07/2015 CABG x 3 (LIMA->LAD, VG->OM2, VG->PDA) w/ AVR.  [name]. Carotid stenosis, right 09/2011   Dr. Allyson SabalBerry did R carotid stenting 01/2014.  Carotid dopplers 06/2015 essentially normal.  No change 07/2016--repeat 1 yr.  . Complication of anesthesia    difficulty with intubation,   . Decreased pedal pulses    LE doppler 11/08/11- no evidence of arterial insufficiency  . Diabetes mellitus Dx'd age 68   Novolog via insulin pump; sub-optimal control x years  . Diabetic neuropathy (HCC)    fine touch and position sense affected  . Diabetic retinopathy    Proliferative: Hx of retinal detachment on right-light perception only in right eye  . Diastolic dysfunction 2007; 2016   TEE with diastolic dysfunction, EF 74%.  . Difficult intubation    ~ 2002 difficult fiberoptic intubation with takeback bleeding s/p thyroidectomy;  for thyroidectomy was intubated DL X 1 with cricoid pressure but difficult mask (full beard)  . Dizziness    feels off balance  . Erectile dysfunction    [place]  . GERD (gastroesophageal reflux disease)   . History of tobacco abuse    Quit about 1990 (has 35 pack-yr hx)  . Hyperlipemia, mixed    a. intolerant of statins/zeta; per Dr. Allyson SabalBerry 12/2016, pt started on PCSK9--incomplete responder?---01/2017.  [name]. Hypothyroidism, postsurgical    Thyroidectomy 2002; multinodular goiter.  Dr. Elvera LennoxGherghe managing this as of 10/2015.  . Impaired vision    right eye light perception  only; hx of retinal detachment.  . Impingement syndrome of right shoulder    08/26/15 Pt got subacromial steroid injection by orthopedist (Dr. Delfin Edis).  [facility] f/u 01/2016--MRI showed RC tear + AC joint arthritis, arthroscopic surgery done 03/2016 ([facility]).  . Neuromuscular disorder (HCC)    neuropathy  . OSA (obstructive sleep apnea)    06/2011 sleep study: moderate OSA, CPAP at 12 cm H2O.  . Osteoarthritis     Bilat thumb carpometacarpal joints.  Ortho injected each thumb x 2 in 2017.  Also injected 03/14/17.  [name] Recurrent pneumonia   . Shortness of breath dyspnea   . Stroke [facility])    TIA  . TIA (transient ischemic attack) 2015/16   with R carotid dz: pt got R carotid stent 01/2014 by Dr. [name] and was put on dual antiplatelet therapy with ASA 325mg  and plavix 75mg  qd afterwards  . TIA (transient ischemic attack) 05/2015   Left brain (right sided numbness + slurred speech)  Admitted for obs/workup 05/2015.  [name] Venous insufficiency   . Venous reflux 10/14/2011   venous doppler-R GSV continuous reflux throughout; too small for VNUS closure    Past Surgical History:  Procedure Laterality Date  . ABI  01/2016   Normal.  Dr. [name] to repeat in 1 yr.  . AORTIC VALVE REPLACEMENT N/A 07/09/2015   Bovine pericardial valve.  Procedure: AORTIC VALVE REPLACEMENT (AVR);  Surgeon: Loreli Slot, MD;  Location: [facility] OR;  Service: Open Heart Surgery;  Laterality: N/A;  . CARDIAC CATHETERIZATION  10/2011   EF normal.  Diffuse 3 vessel CAD, no stents placed.  Medical mgmt per [facility].  [name] CARDIAC CATHETERIZATION N/A 06/22/2015   Procedure: Right/Left Heart Cath and Coronary Angiography;  Surgeon: Runell Gess, MD;  Location: [facility] INVASIVE CV LAB;  Service: Cardiovascular;  Laterality: N/A;  . carotid dopplers  06/30/15   Normal: repeat when clinically indicated per Dr. [name]  . CAROTID STENT INSERTION Right 01/16/2014   Procedure: CAROTID STENT INSERTION;  Surgeon: Runell Gess, MD;  Location: [facility] CATH LAB;  Service: Cardiovascular;  Laterality: Right;  . CATARACT EXTRACTION     left  . CORONARY ARTERY BYPASS GRAFT N/A 07/09/2015   Procedure: CORONARY ARTERY BYPASS GRAFTING (CABG)TIMES THREE USING LEFT INTERNAL MAMMARY ARTERY AND LEFT SAPHENOUS LEG VEIN HARVESTED ENDOSCOPICALLY;  Surgeon: Loreli Slot, MD;  Location: [facility] OR;  Service: Open Heart Surgery;  Laterality: N/A;  . EYE SURGERY     Multiple laser  surgeries for diabetic retinopathy, also cataract surgery OU.  [name] INTRAOPERATIVE TRANSESOPHAGEAL ECHOCARDIOGRAM N/A 07/09/2015   Procedure: INTRAOPERATIVE TRANSESOPHAGEAL ECHOCARDIOGRAM;  Surgeon: Loreli Slot, MD;  Location: [facility] OR;  Service: Open Heart Surgery;  Laterality: N/A;  . lasix eye    . LEFT AND RIGHT HEART CATHETERIZATION WITH CORONARY ANGIOGRAM N/A 10/28/2011   Procedure: LEFT AND R0IHT HEART CATHETERIZATION WITH CORONARY ANGIOGRAM;  Surgeon: Lennette Bihari, MD;  Location: [facility] CATH LAB;  Service: Cardiovascular;  Laterality: N/A;  . RETINAL DETACHMENT SURGERY     Right eye  . ROTATOR CUFF REPAIR W/ DISTAL CLAVICLE EXCISION Right 03/2016   Arthroscopic ([facility])  . THYROID SURGERY  2002  . TRANSTHORACIC ECHOCARDIOGRAM  10/2011;12/2012;01/2015; 08/2015; 07/28/16   Mod AS, mild LVH, EF 60-65%, no RWMA.  01/2015 EF 60-65%, grade I DD, progression of mod/sev AS.  08/2015 normal lv fxn with normal fxning bioprosthetic Ao valve.  07/2016--no change compared to 2017 echo.       reports  that he has quit smoking. He quit smokeless tobacco use about 30 years ago. He reports current alcohol use. He reports that he does not use drugs.  Allergies  Allergen Reactions  . Rosuvastatin Calcium Other (See Comments)    Problem with higher dosages (Lipitor caused memory issues), also caused leg pains and lethargy  . Statins Other (See Comments)    Problem with higher dosages (Lipitor caused memory issues), also caused leg pains and lethargy  . Tape Rash and Other (See Comments)    Adhesive tape.  (Paper tape OK)    Family History  Problem Relation Age of Onset  . Cancer Mother        lung cancer  . Alcohol abuse Father   . Cancer Father        laryngeal cancer  . Cancer Brother        oldest brother had lung cancer and melanoma   Family history reviewed and not pertinent   Prior to Admission medications   Medication Sig Start Date End Date Taking? Authorizing Provider  aspirin EC  81 MG tablet Take 81 mg by mouth daily.   Yes [provider]  clindamycin (CLEOCIN) 300 MG capsule Take 1 capsule (300 mg total) by mouth 3 (three) times daily. 04/14/18  Yes Raeford Razor, MD  clopidogrel (PLAVIX) 75 MG tablet TAKE 1 TABLET BY MOUTH EVERY DAY 07/04/16  Yes Runell Gess, MD  Evolocumab with Infusor (REPATHA PUSHTRONEX SYSTEM) 420 MG/3.5ML SOCT Inject 1 Dose into the skin every 30 (thirty) days. 01/23/17  Yes Hilty, Lisette Abu, MD  FEVERFEW PO Take 1 capsule by mouth daily. To prevent migraines   Yes [provider]  Insulin Human (INSULIN PUMP) SOLN Inject into the skin continuous. Basal rate .95/hr, current pump settings: 12am .7, 2am 1, 7am 1.1, 11am .9, 5pm .9.25, 11pm .85. Uses Novolog Insulin.   Yes [provider]  insulin lispro (HUMALOG) 100 UNIT/ML injection Use 60 units via insulin pump. 08/01/16  Yes Carlus Pavlov, MD  levothyroxine (SYNTHROID, LEVOTHROID) 150 MCG tablet Take 1 tablet by mouth daily. 08/30/17  Yes [provider]  losartan (COZAAR) 50 MG tablet Take 50 mg by mouth daily. 03/30/18  Yes [provider]  Multiple Vitamins-Minerals (ONE-A-DAY MENS 50+ ADVANTAGE) TABS Take 1 tablet by mouth daily with breakfast.   Yes [provider]  omeprazole (PRILOSEC OTC) 20 MG tablet Take 20 mg by mouth daily.   Yes [provider]  clotrimazole-betamethasone (LOTRISONE) cream Apply 1 application topically 2 (two) times daily. Patient not taking: Reported on 04/16/2018 08/25/16   [name], MD  tadalafil (CIALIS) 20 MG tablet Take 1 tablet (20 mg total) by mouth daily as needed for erectile dysfunction. Patient not taking: Reported on 04/16/2018 10/23/15   Carlus Pavlov, MD    Physical Exam: Vitals:   04/16/18 1315 04/16/18 1330 04/16/18 1345 04/16/18 1535  BP:   122/87 (!) 140/59  Pulse: 84  82 75  Resp: 19 15 (!) 21 18  Temp:    98.8 F (37.1 C)  TempSrc:    Oral  SpO2: 97%  100% 99%   Weight:      Height:        Constitutional: NAD, calm, comfortable Vitals:   04/16/18 1315 04/16/18 1330 04/16/18 1345 04/16/18 1535  BP:   122/87 (!) 140/59  Pulse: 84  82 75  Resp: 19 15 (!) 21 18  Temp:    98.8 F (37.1 C)  TempSrc:    Oral  SpO2: 97%  100% 99%  Weight:      Height:       Eyes: PERRL, lids and conjunctivae normal ENMT: Mucous membranes are moist. Posterior pharynx clear of any exudate or lesions.Normal dentition.  Neck: normal, supple, no masses, no thyromegaly Respiratory: clear to auscultation bilaterally, no wheezing, no crackles. Normal respiratory effort. No accessory muscle use.  Cardiovascular: Regular rate and rhythm, no murmurs   Abdomen: no tenderness, no masses palpated. No hepatosplenomegaly. Bowel sounds positive.  Musculoskeletal:  right elbow bandaged, right forearm is swollen and erythematous , extended up to the middle of the arm.  Skin: no rashes, lesions, ulcers. No induration Neurologic: CN 2-12 grossly intact. Sensation intact, DTR normal. Strength 5/5 in all 4.  Psychiatric: Normal judgment and insight. Alert and oriented x 3. Normal mood.     Labs on Admission: I have personally reviewed following labs and imaging studies  CBC: Recent Labs  Lab 04/16/18 0911  WBC 7.9  NEUTROABS 5.8  HGB 12.5*  HCT 38.1*  MCV 92.9  PLT 240   Basic Metabolic Panel: Recent Labs  Lab 04/16/18 0911  NA 134*  K 4.1  CL 100  CO2 25  GLUCOSE 276*  BUN 20  CREATININE 1.19  CALCIUM 8.3*   GFR: Estimated Creatinine Clearance: 74.7 mL/min (by C-G formula based on SCr of 1.19 mg/dL). Liver Function Tests: No results for input(s): AST, ALT, ALKPHOS, BILITOT, PROT, ALBUMIN in the last 168 hours. No results for input(s): LIPASE, AMYLASE in the last 168 hours. No results for input(s): AMMONIA in the last 168 hours. Coagulation Profile: No results for input(s): INR, PROTIME in the last 168 hours. Cardiac Enzymes: No results for input(s):  CKTOTAL, CKMB, CKMBINDEX, TROPONINI in the last 168 hours. BNP (last 3 results) No results for input(s): PROBNP in the last 8760 hours. HbA1C: No results for input(s): HGBA1C in the last 72 hours. CBG: Recent Labs  Lab 04/16/18 0944  GLUCAP 249*   Lipid Profile: No results for input(s): CHOL, HDL, LDLCALC, TRIG, CHOLHDL, LDLDIRECT in the last 72 hours. Thyroid Function Tests: No results for input(s): TSH, T4TOTAL, FREET4, T3FREE, THYROIDAB in the last 72 hours. Anemia Panel: No results for input(s): VITAMINB12, FOLATE, FERRITIN, TIBC, IRON, RETICCTPCT in the last 72 hours. Urine analysis:    Component Value Date/Time   COLORURINE YELLOW 07/06/2015 0857   APPEARANCEUR CLEAR 07/06/2015 0857   LABSPEC 1.019 07/06/2015 0857   PHURINE 5.5 07/06/2015 0857   GLUCOSEU >1000 (A) 07/06/2015 0857   HGBUR NEGATIVE 07/06/2015 0857   BILIRUBINUR NEGATIVE 07/06/2015 0857   BILIRUBINUR n 01/12/2011 1323   KETONESUR NEGATIVE 07/06/2015 0857   PROTEINUR NEGATIVE 07/06/2015 0857   UROBILINOGEN 1.0 01/12/2014 1649   NITRITE NEGATIVE 07/06/2015 0857   LEUKOCYTESUR NEGATIVE 07/06/2015 0857    Radiological Exams on Admission: Dg Elbow Complete Right  Result Date: 04/16/2018 CLINICAL DATA:  Fall 5 days ago with persistent elbow pain. EXAM: RIGHT ELBOW - COMPLETE 3+ VIEW COMPARISON:  None. FINDINGS: Apparent soft tissue swelling about the olecranon process. No associated fracture, radiopaque foreign body or elbow joint effusion. Joint spaces appear preserved. IMPRESSION: Soft tissue swelling about the posterior aspect of the elbow without associated fracture, joint effusion or radiopaque foreign body. Electronically Signed   By: Simonne Come M.D.   On: 04/16/2018 09:50    EKG: not done.   Assessment/Plan Active Problems:   Injury, superficial, elbow, forearm, or wrist with infection   Left  elbow wound infection:  X RAYS reveal soft tissue swelling.  Start the patient on broad spectrum IV  antibiotics.  No cultures were sent in [facility].  Dr Despina Hick was requested to consult.  Elevate the right arm.    Diabetes Mellitus:  Continue with insulin pump and SSI.    Hypertension:  Well controlled.       DVT prophylaxis: lovenox.  Code Status: full code.  Family Communication: none at bedside.  Disposition Plan: pending clinical improvement.  Consults called: Dr Despina Hick with orthopedics.  Admission status:inpatient/med surg.   Kathlen Mody MD [facility] Pager [phone]  If 7PM-7AM, please contact night-coverage [email or web address] Password [facility]  04/16/2018, 4:47 PM

## 2018-04-16 NOTE — Progress Notes (Signed)
Pharmacy Antibiotic Note  [name] is a 68 y.o. male admitted on 04/16/2018 with cellulitis.  Pharmacy has been consulted for vancomycin dosing.  Plan: Vancomycin 2000mg  IV x1, then 750mg  IV every 12 hours (calc AUC 482, SCr 1.19) Monitor renal function, Cx and clinical progression to narrow Vancomycin levels at steady state  Height: 6\' 2"  (188 cm) Weight: 225 lb (102.1 kg) IBW/kg (Calculated) : 82.2  Temp (24hrs), Avg:98 F (36.7 C), Min:98 F (36.7 C), Max:98 F (36.7 C)  Recent Labs  Lab 04/16/18 0911  WBC 7.9  CREATININE 1.19    Estimated Creatinine Clearance: 74.7 mL/min (by C-G formula based on SCr of 1.19 mg/dL).    Allergies  Allergen Reactions  . Rosuvastatin Calcium Other (See Comments)    Problem with higher dosages (Lipitor caused memory issues), also caused leg pains and lethargy  . Statins Other (See Comments)    Problem with higher dosages (Lipitor caused memory issues), also caused leg pains and lethargy  . Tape Rash and Other (See Comments)    Adhesive tape.  (Paper tape OK)    Antimicrobials this admission: Vanc 4/13>> Zosyn x1  Dose adjustments this admission: n/a  Microbiology results: 4/13 BCx: sent  Daylene Posey, PharmD Clinical Pharmacist Please check AMION for all [facility] Pharmacy numbers 04/16/2018 10:01 AM

## 2018-04-16 NOTE — Consult Note (Signed)
Reason for Consult: Right elbow cellulitis Referring Physician: [name] is an 68 y.o. male.  HPI: 68 yo male who had a fall 5 days ago in his yard landing on his right elbow and sustaining a curvilinear laceration over the dorsum of his elbow. He was seen in the [facility] ED and the laceration was stapled closed. He did not receive any antibiotics at the time. 2 days ago he returned to th ED as he had uncontrolled blood glucose levels and developed purulent drainage from the laceration. He has not had any fever or chills. He had the staples removed that evening and the wound was irrigated with particulate material coming out according to the patient. He had increased swelling this AM and went in for a wound check at which time it was noted that he also had cellulitis. The ED consulted the hospitalist team who admitted him. We are consulted for wound management and consideration of surgery. He has not had further drainage today. He denies fever, chills, or constitutional symptoms  Past Medical History:  Diagnosis Date  . Abnormal stress test 10/27/2011   lat isch--cardiac cath 10/28/2011  . Aortic stenosis, moderate Sept/Oct /2013   2017-> s/p bioprosthetic AV [facility] Ease bovine pericardial valve model 3300 TFX, ser # [id number]).  . 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 Inferolateral perfusion defect + EKG abnormality during stress testing prompted Cath by [facility]: 3V CAD, EF normal.  Med mgmt recommended; b. 06/2015 Cath: 3VD and mod AS; c. 07/2015 CABG x 3 (LIMA->LAD, VG->OM2, VG->PDA) w/ AVR.  [name] Carotid stenosis, right 09/2011   Dr. [name] did R carotid stenting 01/2014.  Carotid dopplers 06/2015 essentially normal.  No change 07/2016--repeat 1 yr.  . Complication of anesthesia    difficulty with intubation,   . Decreased pedal pulses    LE doppler 11/08/11- no evidence of arterial insufficiency  . Diabetes mellitus Dx'd age 58   Novolog via insulin pump; sub-optimal control x  years  . Diabetic neuropathy (HCC)    fine touch and position sense affected  . Diabetic retinopathy    Proliferative: Hx of retinal detachment on right-light perception only in right eye  . Diastolic dysfunction 2007; 2016   TEE with diastolic dysfunction, EF 74%.  . Difficult intubation    ~ 2002 difficult fiberoptic intubation with takeback bleeding s/p thyroidectomy;  for thyroidectomy was intubated DL X 1 with cricoid pressure but difficult mask (full beard)  . Dizziness    feels off balance  . Erectile dysfunction    [place]rologist [place]  . GERD (gastroesophageal reflux disease)   . History of tobacco abuse    Quit about 1990 (has 35 pack-yr hx)  . Hyperlipemia, mixed    a. intolerant of statins/zeta; per Dr. [name] 12/2016, pt started on PCSK9--incomplete responder?---01/2017.  [name] Hypothyroidism, postsurgical    Thyroidectomy 2002; multinodular goiter.  Dr. Elvera Lennox managing this as of 10/2015.  . Impaired vision    right eye light perception only; hx of retinal detachment.  . Impingement syndrome of right shoulder    08/26/15 Pt got subacromial steroid injection by orthopedist (Dr. Delfin Edis).  [facility] f/u 01/2016--MRI showed RC tear + AC joint arthritis, arthroscopic surgery done 03/2016 ([facility]).  . Neuromuscular disorder (HCC)    neuropathy  . OSA (obstructive sleep apnea)    06/2011 sleep study: moderate OSA, CPAP at 12 cm H2O.  . Osteoarthritis    Bilat thumb carpometacarpal joints.  Ortho injected each thumb x 2 in 2017.  Also injected 03/14/17.  [name] Recurrent pneumonia   . Shortness of breath dyspnea   . Stroke [facility])    TIA  . TIA (transient ischemic attack) 2015/16   with R carotid dz: pt got R carotid stent 01/2014 by Dr. [name] and was put on dual antiplatelet therapy with ASA  and plavix  qd afterwards  . TIA (transient ischemic attack) 05/2015   Left brain (right sided numbness + slurred speech)  Admitted for obs/workup 05/2015.  [name] Venous  insufficiency   . Venous reflux 10/14/2011   venous doppler-R GSV continuous reflux throughout; too small for VNUS closure    Past Surgical History:  Procedure Laterality Date  . ABI  01/2016   Normal.  Dr. [name] to repeat in 1 yr.  . AORTIC VALVE REPLACEMENT N/A 07/09/2015   Bovine pericardial valve.  Procedure: AORTIC VALVE REPLACEMENT (AVR);  Surgeon: Loreli Slot, MD;  Location: [facility] OR;  Service: Open Heart Surgery;  Laterality: N/A;  . CARDIAC CATHETERIZATION  10/2011   EF normal.  Diffuse 3 vessel CAD, no stents placed.  Medical mgmt per [facility].  [name] CARDIAC CATHETERIZATION N/A 06/22/2015   Procedure: Right/Left Heart Cath and Coronary Angiography;  Surgeon: Runell Gess, MD;  Location: [facility] INVASIVE CV LAB;  Service: Cardiovascular;  Laterality: N/A;  . carotid dopplers  06/30/15   Normal: repeat when clinically indicated per Dr. [name]  . CAROTID STENT INSERTION Right 01/16/2014   Procedure: CAROTID STENT INSERTION;  Surgeon: Runell Gess, MD;  Location: [facility] CATH LAB;  Service: Cardiovascular;  Laterality: Right;  . CATARACT EXTRACTION     left  . CORONARY ARTERY BYPASS GRAFT N/A 07/09/2015   Procedure: CORONARY ARTERY BYPASS GRAFTING (CABG)TIMES THREE USING LEFT INTERNAL MAMMARY ARTERY AND LEFT SAPHENOUS LEG VEIN HARVESTED ENDOSCOPICALLY;  Surgeon: Loreli Slot, MD;  Location: [facility] OR;  Service: Open Heart Surgery;  Laterality: N/A;  . EYE SURGERY     Multiple laser surgeries for diabetic retinopathy, also cataract surgery OU.  [name] INTRAOPERATIVE TRANSESOPHAGEAL ECHOCARDIOGRAM N/A 07/09/2015   Procedure: INTRAOPERATIVE TRANSESOPHAGEAL ECHOCARDIOGRAM;  Surgeon: Loreli Slot, MD;  Location: [facility] OR;  Service: Open Heart Surgery;  Laterality: N/A;  . lasix eye    . LEFT AND RIGHT HEART CATHETERIZATION WITH CORONARY ANGIOGRAM N/A 10/28/2011   Procedure: LEFT AND R0IHT HEART CATHETERIZATION WITH CORONARY ANGIOGRAM;  Surgeon: Lennette Bihari, MD;  Location: [facility] CATH LAB;   Service: Cardiovascular;  Laterality: N/A;  . RETINAL DETACHMENT SURGERY     Right eye  . ROTATOR CUFF REPAIR W/ DISTAL CLAVICLE EXCISION Right 03/2016   Arthroscopic ([facility])  . THYROID SURGERY  2002  . TRANSTHORACIC ECHOCARDIOGRAM  10/2011;12/2012;01/2015; 08/2015; 07/28/16   Mod AS, mild LVH, EF 60-65%, no RWMA.  01/2015 EF 60-65%, grade I DD, progression of mod/sev AS.  08/2015 normal lv fxn with normal fxning bioprosthetic Ao valve.  07/2016--no change compared to 2017 echo.      Family History  Problem Relation Age of Onset  . Cancer Mother        lung cancer  . Alcohol abuse Father   . Cancer Father        laryngeal cancer  . Cancer Brother        oldest brother had lung cancer and melanoma    Social History:  reports that he has quit smoking. He quit smokeless tobacco use about 30 years ago. He reports current alcohol use.  He reports that he does not use drugs.  Allergies:  Allergies  Allergen Reactions  . Rosuvastatin Calcium Other (See Comments)    Problem with higher dosages (Lipitor caused memory issues), also caused leg pains and lethargy  . Statins Other (See Comments)    Problem with higher dosages (Lipitor caused memory issues), also caused leg pains and lethargy  . Tape Rash and Other (See Comments)    Adhesive tape.  (Paper tape OK)    Medications: I have reviewed the patient's current medications.  Results for orders placed or performed during the hospital encounter of 04/16/18 (from the past 48 hour(s))  Culture, blood (Routine X 2) w Reflex to ID Panel     Status: None (Preliminary result)   Collection Time: 04/16/18  9:11 AM  Result Value Ref Range   Specimen Description      BLOOD LEFT ANTECUBITAL Performed at [facility], [address] [place], [place] [address]    Special Requests      BOTTLES DRAWN AEROBIC AND ANAEROBIC Blood Culture adequate volume Performed at [facility], [address] [place], [place] [address]     Culture PENDING    Report Status PENDING   CBC with Differential/Platelet     Status: Abnormal   Collection Time: 04/16/18  9:11 AM  Result Value Ref Range   WBC 7.9 4.0 - 10.5 K/uL   RBC 4.10 (L) 4.22 - 5.81 MIL/uL   Hemoglobin 12.5 (L) 13.0 - 17.0 g/dL   HCT 32.4 (L) 40.1 - 02.7 %   MCV 92.9 80.0 - 100.0 fL   MCH 30.5 26.0 - 34.0 pg   MCHC 32.8 30.0 - 36.0 g/dL   RDW 25.3 66.4 - 40.3 %   Platelets 240 150 - 400 K/uL   nRBC 0.0 0.0 - 0.2 %   Neutrophils Relative % 75 %   Neutro Abs 5.8 1.7 - 7.7 K/uL   Lymphocytes Relative 13 %   Lymphs Abs 1.0 0.7 - 4.0 K/uL   Monocytes Relative 8 %   Monocytes Absolute 0.6 0.1 - 1.0 K/uL   Eosinophils Relative 4 %   Eosinophils Absolute 0.3 0.0 - 0.5 K/uL   Basophils Relative 0 %   Basophils Absolute 0.0 0.0 - 0.1 K/uL   Immature Granulocytes 0 %   Abs Immature Granulocytes 0.03 0.00 - 0.07 K/uL    Comment: Performed at [facility], [address] [place], [place] [address]  Basic metabolic panel     Status: Abnormal   Collection Time: 04/16/18  9:11 AM  Result Value Ref Range   Sodium 134 (L) 135 - 145 mmol/L   Potassium 4.1 3.5 - 5.1 mmol/L   Chloride 100 98 - 111 mmol/L   CO2 25 22 - 32 mmol/L   Glucose, Bld 276 (H) 70 - 99 mg/dL   BUN 20 8 - 23 mg/dL   Creatinine, Ser 9.56 0.61 - 1.24 mg/dL   Calcium 8.3 (L) 8.9 - 10.3 mg/dL   GFR calc non Af Amer >60 >60 mL/min   GFR calc Af Amer >60 >60 mL/min   Anion gap 9 5 - 15    Comment: Performed at [facility], [address] [place], [place] [address]  Lactic acid, plasma     Status: None   Collection Time: 04/16/18  9:27 AM  Result Value Ref Range   Lactic Acid, Venous 1.0 0.5 - 1.9 mmol/L  Comment: Performed at [facility], [address] [place], [place] [id number]  CBG monitoring, ED     Status: Abnormal   Collection Time: 04/16/18  9:44 AM  Result Value Ref Range   Glucose-Capillary 249 (H) 70 - 99 mg/dL  Glucose, capillary     Status:  Abnormal   Collection Time: 04/16/18  4:59 PM  Result Value Ref Range   Glucose-Capillary 277 (H) 70 - 99 mg/dL    Dg Elbow Complete Right  Result Date: 04/16/2018 CLINICAL DATA:  Fall 5 days ago with persistent elbow pain. EXAM: RIGHT ELBOW - COMPLETE 3+ VIEW COMPARISON:  None. FINDINGS: Apparent soft tissue swelling about the olecranon process. No associated fracture, radiopaque foreign body or elbow joint effusion. Joint spaces appear preserved. IMPRESSION: Soft tissue swelling about the posterior aspect of the elbow without associated fracture, joint effusion or radiopaque foreign body. Electronically Signed   By: Simonne ComeJohn  Watts M.D.   On: 04/16/2018 09:50    ROS Blood pressure (!) 140/59, pulse 75, temperature 98.8 F (37.1 C), temperature source Oral, resp. rate 18, height 6\' 2"  (1.88 m), weight 102.1 kg, SpO2 99 %. Physical Exam Physical Examination: General appearance - alert, well appearing, and in no distress Mental status - alert, oriented to person, place, and time Neurological - alert, oriented, normal speech, no focal findings or movement disorder noted Extremities - peripheral pulses normal, no pedal edema, no clubbing or cyanosis Right elbow with curvilinear laceration over olecranon bursa; he has erythema around this area and up approximately 5 cm above the elbow posteriorly; He has hand swelling and there is mild cellulitis in the forearm; no odor or soft tissue crepitus; has full elbow extension and can flex to 125 (limited by swelling); no pain on forearm pronation, supination  Assessment/Plan: Right elbow laceration- He has no signs of septic arthritis. There is significant cellulitis. I demonstrated how he should elevate the arm to decrease the swelling. Continue IV antibiotics. I do not think he will need surgery for this but we will follow to assess wound status.  [name] 04/16/2018, 7:07 PM

## 2018-04-17 LAB — BASIC METABOLIC PANEL
Anion gap: 7 (ref 5–15)
BUN: 16 mg/dL (ref 8–23)
CO2: 23 mmol/L (ref 22–32)
Calcium: 7.9 mg/dL — ABNORMAL LOW (ref 8.9–10.3)
Chloride: 104 mmol/L (ref 98–111)
Creatinine, Ser: 0.93 mg/dL (ref 0.61–1.24)
GFR calc Af Amer: >60 mL/min (ref >60)
GFR calc non Af Amer: >60 mL/min (ref >60)
Glucose, Bld: 214 mg/dL — ABNORMAL HIGH (ref 70–99)
Potassium: 3.7 mmol/L (ref 3.5–5.1)
Sodium: 134 mmol/L — ABNORMAL LOW (ref 135–145)

## 2018-04-17 LAB — GLUCOSE, CAPILLARY
Glucose-Capillary: 230 mg/dL — ABNORMAL HIGH (ref 70–99)
Glucose-Capillary: 216 mg/dL — ABNORMAL HIGH (ref 70–99)
Glucose-Capillary: 323 mg/dL — ABNORMAL HIGH (ref 70–99)
Glucose-Capillary: 242 mg/dL — ABNORMAL HIGH (ref 70–99)
Glucose-Capillary: 176 mg/dL — ABNORMAL HIGH (ref 70–99)

## 2018-04-17 LAB — CBC
HCT: 34.8 % — ABNORMAL LOW (ref 39.0–52.0)
Hemoglobin: 11.7 g/dL — ABNORMAL LOW (ref 13.0–17.0)
MCH: 31.5 pg (ref 26.0–34.0)
MCHC: 33.6 g/dL (ref 30.0–36.0)
MCV: 93.8 fL (ref 80.0–100.0)
Platelets: 227 10*3/uL (ref 150–400)
RBC: 3.71 MIL/uL — ABNORMAL LOW (ref 4.22–5.81)
RDW: 12.1 % (ref 11.5–15.5)
WBC: 8.1 10*3/uL (ref 4.0–10.5)
nRBC: 0 % (ref 0.0–0.2)

## 2018-04-17 MED ORDER — PANTOPRAZOLE SODIUM 40 MG PO TBEC
40.0000 mg | DELAYED_RELEASE_TABLET | Freq: Every day | ORAL | Status: DC
  Administered 2018-04-17 – 2018-04-18 (×2): 40 mg via ORAL
  Filled 2018-04-17 (×2): qty 1

## 2018-04-17 MED ORDER — LEVOTHYROXINE SODIUM 75 MCG PO TABS
150.0000 ug | ORAL_TABLET | Freq: Every day | ORAL | Status: DC
  Administered 2018-04-17 – 2018-04-19 (×3): 150 ug via ORAL
  Filled 2018-04-17 (×4): qty 2

## 2018-04-17 MED ORDER — CLOPIDOGREL BISULFATE 75 MG PO TABS
75.0000 mg | ORAL_TABLET | Freq: Every day | ORAL | Status: DC
  Administered 2018-04-17 – 2018-04-18 (×2): 75 mg via ORAL
  Filled 2018-04-17 (×2): qty 1

## 2018-04-17 MED ORDER — VANCOMYCIN HCL 1000 MG IV SOLR
1000.0000 mg | Freq: Two times a day (BID) | INTRAVENOUS | Status: DC
  Administered 2018-04-17 – 2018-04-18 (×4): 1000 mg via INTRAVENOUS
  Filled 2018-04-17 (×6): qty 1000

## 2018-04-17 MED ORDER — ENOXAPARIN SODIUM 40 MG/0.4ML ~~LOC~~ SOLN
40.0000 mg | SUBCUTANEOUS | Status: DC
  Administered 2018-04-17 – 2018-04-18 (×2): 40 mg via SUBCUTANEOUS
  Filled 2018-04-17 (×2): qty 0.4

## 2018-04-17 NOTE — Progress Notes (Signed)
Subjective: Patient has no complaints except for hand still being swollen Trying to elevate arm but hard to do in bed at night   Objective: Vital signs in last 24 hours: Temp:  [98 F (36.7 C)-99.2 F (37.3 C)] 98.8 F (37.1 C) (04/14 0446) Pulse Rate:  [70-85] 70 (04/14 0446) Resp:  [12-21] 15 (04/14 0446) BP: (122-156)/(59-87) 152/70 (04/14 0446) SpO2:  [97 %-100 %] 97 % (04/14 0446) Weight:  [102.1 kg] 102.1 kg (04/13 0902)  Intake/Output from previous day: 04/13 0701 - 04/14 0700 In: 397.5 [P.O.:360; I.V.:37.5] Out: 600 [Urine:600] Intake/Output this shift: No intake/output data recorded.  Recent Labs    04/16/18 0911 04/17/18 0247  HGB 12.5* 11.7*   Recent Labs    04/16/18 0911 04/17/18 0247  WBC 7.9 8.1  RBC 4.10* 3.71*  HCT 38.1* 34.8*  PLT 240 227   Recent Labs    04/16/18 0911 04/17/18 0247  NA 134* 134*  K 4.1 3.7  CL 100 104  CO2 25 23  BUN 20 16  CREATININE 1.19 0.93  GLUCOSE 276* 214*  CALCIUM 8.3* 7.9*   No results for input(s): LABPT, INR in the last 72 hours.  Swelling in arm has decreased but hand still swollen probably due to compressive wrap being tight on forearm. Forearm and elbow swelling decreased.  Minimal serous drainage from laceration Erythema in forearm/elbow has decreased  Assessment/Plan: Right elbow wound with forearm cellulitis- Doing better with elevation and IV antibiotics. Continue both. Will recheck tomorrow    Ollen Gross 04/17/2018, 7:28 AM

## 2018-04-17 NOTE — Progress Notes (Signed)
Pt. seen on CPAP rounds, no needs at this time, has own bottle of distilled water for CPAP humidifier, aware to notify if needed.

## 2018-04-17 NOTE — Progress Notes (Signed)
Pharmacy Antibiotic Note  [name] is a 68 y.o. male admitted on 04/16/2018 with wound infection on rt elbow.  Pharmacy has been consulted for zosyn dosing.  Plan: Continue Zosyn 3.375g IV Q8H infused over 4hrs. Increase vancomycin to 1g IV q12h for improved SCr Consider ordering vancomycin levels if tx continues > 72hrs or renal function changes significantly Daily SCr   Height: 6\' 2"  (188 cm) Weight: 225 lb (102.1 kg) IBW/kg (Calculated) : 82.2  Temp (24hrs), Avg:98.6 F (37 C), Min:98 F (36.7 C), Max:99.2 F (37.3 C)  Recent Labs  Lab 04/16/18 0911 04/16/18 0927 04/17/18 0247  WBC 7.9  --  8.1  CREATININE 1.19  --  0.93  LATICACIDVEN  --  1.0  --     Estimated Creatinine Clearance: 95.6 mL/min (by C-G formula based on SCr of 0.93 mg/dL).    Allergies  Allergen Reactions  . Rosuvastatin Calcium Other (See Comments)    Problem with higher dosages (Lipitor caused memory issues), also caused leg pains and lethargy  . Statins Other (See Comments)    Problem with higher dosages (Lipitor caused memory issues), also caused leg pains and lethargy  . Tape Rash and Other (See Comments)    Adhesive tape.  (Paper tape OK)    Antimicrobials this admission: 4/13 vanc >> 4/13 zosyn >>  Dose adjustments this admission: 4/14 increase vancomycin from 750mg  to 1g q12h for improved SCr  Micro: 4/13 BCx: sent  Thank you for allowing pharmacy to be a part of this patient's care.  Loralee Pacas, PharmD, BCPS Pager: [phone] 04/17/2018, 7:22 AM Pager [phone]

## 2018-04-17 NOTE — Progress Notes (Signed)
Pt. made previous RT aware that he would have home CPAP brought in 04/17/2018 for H/S use, aware to notify if needed.

## 2018-04-17 NOTE — Progress Notes (Addendum)
[facility] PROGRESS NOTE  OLANDER KRAEGER FHL:[id number] DOB: 02/23/1950 DOA: 04/16/2018 PCP: Bailey Mech, PA-C   Narrative: 68 year old Caucasian male Type 1 diabetes mellitus 1963-insulin pump-severe polyneuropathy with insensate feet Bovine aortic valve replacement on Plavix Hypothyroidism TIAs in the past  Relates history of accidental fall secondary to tripping over a tree limb-cut his right arm went to ED received 6 staples not placed on antibiotics went back to the ED subsequently-staples were opened and wound was loosely packed with mild purulence and some pine bark mulch was noted in the wound-was sent home on stronger antibiotic Patient ultimately showed up at the ED 4/13 with significant arm swelling  Dr. Despina Hick of orthopedics consulted recommended nonoperative management  A & Plan Cellulitis and mild purulence of right arm secondary to fall and foreign body? Personally discussed Dr. Cristal Deer need for surgery wound already showing improvement and is all superficial Continue vancomycin Zosyn narrow off of Zosyn to cefepime a.m. A.m. CBC-defer further arm imaging at this time Diabetes mellitus type 1-insensate feet-insulin pump Discontinue sliding scale continue pump Patient has trialed gabapentin in the past did not agree with him He is hesitant to try other medications-would suggest nortriptyline amitriptyline Hypothyroidism Continue Synthroid 1 tablet 150 mg daily Need TSH 3 to 6 weeks outpatient Bovine aortic valve replacement-#23 magna ease pericardial valve Three-vessel CABG with aortic stenosis-LIMA-LAD, SVG-OM 2, SVG-PDA, endoscopic vein harvest left thigh 07/2015 Continue Plavix (confirmed with Dr. Antony Odea no surgical intervention planned), aspirin 81 mg held at this time please resume on discharge Patient not taking losartan at this time please resume on discharge Patient needs counseling regarding use of Cialis and unresponsiveness to nitrates if  he does have further chest pain as outpatient  Lovenox, full code, no family, inpatient pending resolution of swelling in arm Mahala Menghini, MD  [facility] [employer] app OR [email or web address] 7PM-7AM contact night coverage as above 04/17/2018, 2:35 PM  LOS: 1 day   Consultants:  Dr Despina Hick [place] [facility]  Procedures:  n  Antimicrobials:  nad  Interval history/Subjective:  Some pain No swelling No fever no chills no n  NO fever--pain present in RUE-trying to keep elevated  Objective:  Vitals:  Vitals:   04/17/18 0446 04/17/18 1404  BP: (!) 152/70 (!) 122/59  Pulse: 70 77  Resp: 15 17  Temp: 98.8 F (37.1 C) 97.9 F (36.6 C)  SpO2: 97% 97%    Exam:  pleasant thick neck looks ~ stated age, mallampati 3 s1 s 2no m/r/g R arm thickly bandaged--didn't examine [alusio saw it this am] abd soft nt nd no rebound no guard Neuro ? reflexes Power 5/5   I have personally reviewed the following:  DATA  Normal wbc Sugars 220 range  Scheduled Meds: . clopidogrel  75 mg Oral Daily  . enoxaparin (LOVENOX) injection  40 mg Subcutaneous Q24H  . insulin pump   Subcutaneous TID AC, HS, 0200  . levothyroxine  150 mcg Oral Q0600  . pantoprazole  40 mg Oral Daily   Continuous Infusions: . sodium chloride 75 mL/hr at 04/16/18 1710  . piperacillin-tazobactam (ZOSYN)  IV 3.375 g (04/17/18 1018)  . vancomycin 1,000 mg (04/17/18 1047)  . vancomycin      Active Problems:   Injury, superficial, elbow, forearm, or wrist with infection   LOS: 1 day

## 2018-04-18 DIAGNOSIS — I10 Essential (primary) hypertension: Secondary | ICD-10-CM

## 2018-04-18 DIAGNOSIS — I2510 Atherosclerotic heart disease of native coronary artery without angina pectoris: Secondary | ICD-10-CM

## 2018-04-18 DIAGNOSIS — S50911A Unspecified superficial injury of right forearm, initial encounter: Secondary | ICD-10-CM

## 2018-04-18 DIAGNOSIS — S50901A Unspecified superficial injury of right elbow, initial encounter: Secondary | ICD-10-CM

## 2018-04-18 DIAGNOSIS — S60911A Unspecified superficial injury of right wrist, initial encounter: Secondary | ICD-10-CM

## 2018-04-18 LAB — CBC WITH DIFFERENTIAL/PLATELET
Abs Immature Granulocytes: 0.05 10*3/uL (ref 0.00–0.07)
Basophils Absolute: 0.1 10*3/uL (ref 0.0–0.1)
Basophils Relative: 1 %
Eosinophils Absolute: 0.6 10*3/uL — ABNORMAL HIGH (ref 0.0–0.5)
Eosinophils Relative: 8 %
HCT: 33.6 % — ABNORMAL LOW (ref 39.0–52.0)
Hemoglobin: 11.3 g/dL — ABNORMAL LOW (ref 13.0–17.0)
Immature Granulocytes: 1 %
Lymphocytes Relative: 15 %
Lymphs Abs: 1.2 10*3/uL (ref 0.7–4.0)
MCH: 31.4 pg (ref 26.0–34.0)
MCHC: 33.6 g/dL (ref 30.0–36.0)
MCV: 93.3 fL (ref 80.0–100.0)
Monocytes Absolute: 0.6 10*3/uL (ref 0.1–1.0)
Monocytes Relative: 8 %
Neutro Abs: 5.7 10*3/uL (ref 1.7–7.7)
Neutrophils Relative %: 67 %
Platelets: 246 10*3/uL (ref 150–400)
RBC: 3.6 MIL/uL — ABNORMAL LOW (ref 4.22–5.81)
RDW: 12.1 % (ref 11.5–15.5)
WBC: 8.3 10*3/uL (ref 4.0–10.5)
nRBC: 0 % (ref 0.0–0.2)

## 2018-04-18 LAB — HIV ANTIBODY (ROUTINE TESTING W REFLEX): HIV Screen 4th Generation wRfx: NONREACTIVE

## 2018-04-18 LAB — RENAL FUNCTION PANEL
Albumin: 2.7 g/dL — ABNORMAL LOW (ref 3.5–5.0)
Anion gap: 7 (ref 5–15)
BUN: 14 mg/dL (ref 8–23)
CO2: 24 mmol/L (ref 22–32)
Calcium: 7.9 mg/dL — ABNORMAL LOW (ref 8.9–10.3)
Chloride: 103 mmol/L (ref 98–111)
Creatinine, Ser: 1.05 mg/dL (ref 0.61–1.24)
GFR calc Af Amer: >60 mL/min (ref >60)
GFR calc non Af Amer: >60 mL/min (ref >60)
Glucose, Bld: 250 mg/dL — ABNORMAL HIGH (ref 70–99)
Phosphorus: 3.3 mg/dL (ref 2.5–4.6)
Potassium: 3.9 mmol/L (ref 3.5–5.1)
Sodium: 134 mmol/L — ABNORMAL LOW (ref 135–145)

## 2018-04-18 LAB — GLUCOSE, CAPILLARY
Glucose-Capillary: 214 mg/dL — ABNORMAL HIGH (ref 70–99)
Glucose-Capillary: 311 mg/dL — ABNORMAL HIGH (ref 70–99)
Glucose-Capillary: 260 mg/dL — ABNORMAL HIGH (ref 70–99)
Glucose-Capillary: 238 mg/dL — ABNORMAL HIGH (ref 70–99)
Glucose-Capillary: 160 mg/dL — ABNORMAL HIGH (ref 70–99)

## 2018-04-18 LAB — HEMOGLOBIN A1C
Hgb A1c MFr Bld: 8.8 % — ABNORMAL HIGH (ref 4.8–5.6)
Mean Plasma Glucose: 206 mg/dL

## 2018-04-18 NOTE — Progress Notes (Signed)
Pt gave himself 7.9 units, cbg on his monitor was 212.

## 2018-04-18 NOTE — Progress Notes (Signed)
PROGRESS NOTE    [name]  ZOX:[id number] DOB: 10/20/1950 DOA: 04/16/2018 PCP: Nathaniel MechPodraza, Cole Christopher, PA-C   Brief Narrative:  HPI on 04/16/2018 by Dr. Lia FoyerVijaya Akula Jamerson L Carter is a 68 y.o. male with medical history significant of  Aortic stenosis, s/p bioprosthetic AV, multiple vessel CAD, carotid artery stenting, DM, Diabetic Neuropathy, GERD, hypothyroidism, hyperlipidemia, OSA, TIA, Stroke reports [place] bushes [place] nd [place] right elbow [place] the [place] on Wednesday, [facility] the [facility] day, was discharged on keflex. Returned on 11 th noted to have wound infection, and discharged on clindamycin. Despite being on oral meds pt reports his pain and swelling hasn't improved.but the erythema  has improved. [facility]. Pt reports low grade temp, with T max of  100.2, with some chills. No nausea, vomiting, abd pain, headache, diarrhea, chest pain, sob  , cough . He was referred to medical service for admission for broad spectrum antibiotics.   Assessment & Plan   Cellulitis of the right upper extremity -Secondary to fall and possible foreign body -Orthopedic surgery, Dr. Despina Carter, consulted and appreciated.  No need for surgical intervention.  Would like to continue IV antibiotics for an additional 24 hours and then transition to oral.  Diabetes mellitus, Type 1 -Continue insulin pump  Hypothyroidism -Continue Synthroid  Bovine aortic valve replacement/Coronary artery disease -Status post CABG -Currently chest pain-free -Continue Plavix (aspirin to be continued on discharge)  Essential hypertension -Losartan held, BP stable  DVT Prophylaxis  Lovenox  Code Status: Full  Family Communication: None at bedside  Disposition Plan: Admitted. Pending further recommendations from ortho. Suspect home on discharge.  Consultants Orthopedic surgery, Dr. Despina Carter  Procedures  None   Antibiotics   Anti-infectives (From admission, onward)   Start     Dose/Rate Route Frequency Ordered Stop   04/17/18 1000  vancomycin (VANCOCIN) 1,000 mg in sodium chloride 0.9 % 250 mL IVPB     1,000 mg 250 mL/hr over 60 Minutes Intravenous Every 12 hours 04/17/18 0725     04/16/18 2200  vancomycin (VANCOCIN) IVPB 750 mg/150 ml premix  Status:  Discontinued     750 mg 150 mL/hr over 60 Minutes Intravenous Every 12 hours 04/16/18 1007 04/17/18 0725   04/16/18 1800  piperacillin-tazobactam (ZOSYN) IVPB 3.375 g     3.375 g 12.5 mL/hr over 240 Minutes Intravenous Every 8 hours 04/16/18 1657     04/16/18 1015  vancomycin (VANCOCIN) 2,000 mg in sodium chloride 0.9 % 500 mL IVPB     2,000 mg 250 mL/hr over 120 Minutes Intravenous  Once 04/16/18 1003     04/16/18 1011  vancomycin (VANCOCIN) 1000 MG powder    Note to Pharmacy:  [name]  : cabinet override      04/16/18 1011 04/16/18 1017   04/16/18 0945  piperacillin-tazobactam (ZOSYN) IVPB 3.375 g     3.375 g 100 mL/hr over 30 Minutes Intravenous  Once 04/16/18 0930 04/16/18 1020   04/16/18 0945  vancomycin (VANCOCIN) 1,000 mg in sodium chloride 0.9 % 250 mL IVPB  Status:  Discontinued     1,000 mg 250 mL/hr over 60 Minutes Intravenous  Once 04/16/18 0930 04/16/18 1113   04/16/18 0944  vancomycin (VANCOCIN) 1-5 GM/200ML-% IVPB  Status:  Discontinued    Note to Pharmacy:  [name]  : cabinet override      04/16/18 0944 04/16/18 1011  Subjective:   [name] seen and examined today.  Patient with no complaints this morning.  Denies current chest pain, shortness breath, abdominal pain, nausea or vomiting, diarrhea constipation, dizziness or headache.  Feels his arm swelling is also improved and he is able to move his hand more freely. Objective:   Vitals:   04/17/18 0446 04/17/18 1404 04/17/18 2122 04/18/18 0639  BP: (!) 152/70 (!) 122/59 139/61 132/82  Pulse: 70 77 67 87  Resp: Temp: 98.8 F  (37.1 C) 97.9 F (36.6 C) 98.8 F (37.1 C) 98 F (36.7 C)  TempSrc: Oral Oral Oral Oral  SpO2: 97% 97% 98% 95%  Weight:      Height:        Intake/Output Summary (Last 24 hours) at 04/18/2018 1219 Last data filed at 04/18/2018 1610 Gross per 24 hour  Intake 2030 ml  Output -  Net 2030 ml   Filed Weights   04/16/18 0902  Weight: 102.1 kg    Exam  General: Well developed, well nourished, NAD, appears stated age  HEENT: NCAT, mucous membranes moist.   Neck: Supple  Cardiovascular: S1 S2 auscultated, RRR, 3/6 SEM  Respiratory: Clear to auscultation bilaterally with equal chest rise  Abdomen: Soft, nontender, nondistended, + bowel sounds  Extremities: warm dry without cyanosis clubbing or edema of LE. Mild edema/erythema RUE/hand with dressing in place  Neuro: AAOx3, nonfocal  Psych: Does not, appropriate mood and affect   Data Reviewed: I have personally reviewed following labs and imaging studies  CBC: Recent Labs  Lab 04/16/18 0911 04/17/18 0247 04/18/18 0429  WBC 7.9 8.1 8.3  NEUTROABS 5.8  --  5.7  HGB 12.5* 11.7* 11.3*  HCT 38.1* 34.8* 33.6*  MCV 92.9 93.8 93.3  PLT 240 227 246   Basic Metabolic Panel: Recent Labs  Lab 04/16/18 0911 04/17/18 0247 04/18/18 0429  NA 134* 134* 134*  K 4.1 3.7 3.9  CL 100 104 103  CO2 GLUCOSE 276* 214* 250*  BUN CREATININE 1.19 0.93 1.05  CALCIUM 8.3* 7.9* 7.9*  PHOS  --   --  3.3   GFR: Estimated Creatinine Clearance: 84.7 mL/min (by C-G formula based on SCr of 1.05 mg/dL). Liver Function Tests: Recent Labs  Lab 04/18/18 0429  ALBUMIN 2.7*   No results for input(s): LIPASE, AMYLASE in the last 168 hours. No results for input(s): AMMONIA in the last 168 hours. Coagulation Profile: No results for input(s): INR, PROTIME in the last 168 hours. Cardiac Enzymes: No results for input(s): CKTOTAL, CKMB, CKMBINDEX, TROPONINI in the last 168 hours. BNP (last 3 results) No results for  input(s): PROBNP in the last 8760 hours. HbA1C: Recent Labs    04/17/18 0247  HGBA1C 8.8*   CBG: Recent Labs  Lab 04/17/18 1650 04/17/18 2200 04/18/18 0204 04/18/18 0728 04/18/18 1143  GLUCAP 242* 176* 214* 311* 260*   Lipid Profile: No results for input(s): CHOL, HDL, LDLCALC, TRIG, CHOLHDL, LDLDIRECT in the last 72 hours. Thyroid Function Tests: No results for input(s): TSH, T4TOTAL, FREET4, T3FREE, THYROIDAB in the last 72 hours. Anemia Panel: No results for input(s): VITAMINB12, FOLATE, FERRITIN, TIBC, IRON, RETICCTPCT in the last 72 hours. Urine analysis:    Component Value Date/Time   COLORURINE YELLOW 07/06/2015 0857   APPEARANCEUR CLEAR 07/06/2015 0857   LABSPEC 1.019 07/06/2015 0857   PHURINE 5.5 07/06/2015 0857   GLUCOSEU >1000 (A) 07/06/2015 0857   HGBUR NEGATIVE 07/06/2015  0857   BILIRUBINUR NEGATIVE 07/06/2015 0857   BILIRUBINUR n 01/12/2011 1323   KETONESUR NEGATIVE 07/06/2015 0857   PROTEINUR NEGATIVE 07/06/2015 0857   UROBILINOGEN 1.0 01/12/2014 1649   NITRITE NEGATIVE 07/06/2015 0857   LEUKOCYTESUR NEGATIVE 07/06/2015 0857   Sepsis Labs: @LABRCNTIP (procalcitonin:4,lacticidven:4)  ) Recent Results (from the past 240 hour(s))  Culture, blood (Routine X 2) w Reflex to ID Panel     Status: None (Preliminary result)   Collection Time: 04/16/18  9:11 AM  Result Value Ref Range Status   Specimen Description   Final    BLOOD LEFT ANTECUBITAL Performed at [facility], [address] [place], [place] [address]    Special Requests   Final    BOTTLES DRAWN AEROBIC AND ANAEROBIC Blood Culture adequate volume Performed at [facility], [address] [place], [place] [address]    Culture   Final    NO GROWTH 2 DAYS Performed at [facility], [address] [place], [place] [address]    Report Status PENDING  Incomplete      Radiology Studies: No results found.   Scheduled Meds: . clopidogrel  75 mg Oral Daily  .  enoxaparin (LOVENOX) injection  40 mg Subcutaneous Q24H  . insulin pump   Subcutaneous TID AC, HS, 0200  . levothyroxine  150 mcg Oral Q0600  . pantoprazole  40 mg Oral Daily   Continuous Infusions: . sodium chloride 75 mL/hr at 04/18/18 0329  . piperacillin-tazobactam (ZOSYN)  IV 3.375 g (04/18/18 0947)  . vancomycin 1,000 mg (04/18/18 0948)  . vancomycin       LOS: 2 days   Time Spent in minutes   30 minutes  Irish Breisch D.O. on 04/18/2018 at 12:19 PM  Between 7am to 7pm - Please see pager noted on [email or web address]  After 7pm go to [email or web address]  And look for the night coverage person covering for me after hours  [facility] Office  [phone]

## 2018-04-18 NOTE — Progress Notes (Signed)
Pt check his cbg with his monitor and it was 210 and he added 50 to it, so he gave himself 2.175

## 2018-04-18 NOTE — Progress Notes (Signed)
Inpatient Diabetes Program Recommendations  AACE/ADA: New Consensus Statement on Inpatient Glycemic Control (2015)  Target Ranges:  Prepandial:   less than 140 mg/dL      Peak postprandial:   less than 180 mg/dL (1-2 hours)      Critically ill patients:  140 - 180 mg/dL   Lab Results  Component Value Date   GLUCAP 260 (H) 04/18/2018   HGBA1C 8.8 (H) 04/17/2018    Review of Glycemic Control  Diabetes history: DM1 Outpatient Diabetes medications: insulin pump Current orders for Inpatient glycemic control: insulin pump  HgbA1C - 8.8%  Insulin pump settings - 25.7 units Basal - Total  2400-0700 - 0.9 units 0700-1100 - 1.10 units 1100-2200 - 1.20 units 2200-2400 - 0.9 units Bolus - CHO ratio - 1:9 Goal - 120 mg/dL. CF 40  Spoke to pt at length regarding his diabetes control at home. Pt states he has not seen his Endo in quite awhile and may need to obtain a different MD. States his HgbA1C has been up to 10% recently, but states his goal is 7%. Was having some hypoglycemia at home and MD directed him to change settings. Stressed importance of good glycemic control for healing cellulitis. Pt states he occasionally forgets to bolus for his meals at times. Willing to make appt with Endo for insulin pump management. May need to come off pump if blood sugars continue to rise. Pt changes sites every 72 hours and typically wears pump on thighs. Discussed healthy diet, exercise and stress management for improved glucose control.   Will continue to follow.  Thank you. Ailene Ards, RD, LDN, CDE Inpatient Diabetes Coordinator [phone]

## 2018-04-18 NOTE — Progress Notes (Signed)
Subjective: Patient feels like the swelling improved yesterday. Hand is slightly swollen this AM as it is difficult to keep it elevated at night   Objective: Vital signs in last 24 hours: Temp:  [97.9 F (36.6 C)-98.8 F (37.1 C)] 98 F (36.7 C) (04/15 0639) Pulse Rate:  [67-87] 87 (04/15 0639) Resp:  [17-18] 18 (04/15 0639) BP: (122-139)/(59-82) 132/82 (04/15 0639) SpO2:  [95 %-98 %] 95 % (04/15 0639)  Intake/Output from previous day: 04/14 0701 - 04/15 0700 In: 2330 [P.O.:1080; I.V.:1200; IV Piggyback:50] Out: -  Intake/Output this shift: No intake/output data recorded.  Recent Labs    04/16/18 0911 04/17/18 0247 04/18/18 0429  HGB 12.5* 11.7* 11.3*   Recent Labs    04/17/18 0247 04/18/18 0429  WBC 8.1 8.3  RBC 3.71* 3.60*  HCT 34.8* 33.6*  PLT 227 246   Recent Labs    04/17/18 0247 04/18/18 0429  NA 134* 134*  K 3.7 3.9  CL 104 103  CO2 23 24  BUN 16 14  CREATININE 0.93 1.05  GLUCOSE 214* 250*  CALCIUM 7.9* 7.9*   No results for input(s): LABPT, INR in the last 72 hours.  Compartment soft Swelling in forearmand elbow improved dramatically compared to admission. Still a tiny amount of drainage on the bandage. Erythema around laceration also improved. Hand swelling decreased and moving fingers more freely.    Assessment/Plan:   Right arm/elbow cellulitis- Doing better. Continue IV antibiotics for at least 24 more hours and if improvement continues switch to pos. Will recheck tomorrow    Ollen Gross 04/18/2018, 7:58 AM

## 2018-04-18 NOTE — Progress Notes (Signed)
Patient re checked his blood sugar again  and got 116 and still decided not give him self any insulin. We will continue to monitor.

## 2018-04-19 DIAGNOSIS — E1069 Type 1 diabetes mellitus with other specified complication: Secondary | ICD-10-CM

## 2018-04-19 DIAGNOSIS — E039 Hypothyroidism, unspecified: Secondary | ICD-10-CM

## 2018-04-19 LAB — CREATININE, SERUM
Creatinine, Ser: 1 mg/dL (ref 0.61–1.24)
GFR calc Af Amer: >60 mL/min (ref >60)
GFR calc non Af Amer: >60 mL/min (ref >60)

## 2018-04-19 LAB — GLUCOSE, CAPILLARY: Glucose-Capillary: 345 mg/dL — ABNORMAL HIGH (ref 70–99)

## 2018-04-19 MED ORDER — DOXYCYCLINE HYCLATE 100 MG PO CAPS: 100.0000 mg | ORAL_CAPSULE | Freq: Two times a day (BID) | ORAL | 0 refills | Status: AC

## 2018-04-19 NOTE — Discharge Summary (Signed)
Physician Discharge Summary  Nathaniel PepperRichard L Carter ZOX:[id number] DOB: 04/03/1950 DOA: 04/16/2018  PCP: Bailey MechPodraza, Cole Christopher, PA-C  Admit date: 04/16/2018 Discharge date: 04/19/2018  Time spent: 45 minutes  Recommendations for Outpatient Follow-up:  Patient will be discharged to home.  Patient will need to follow up with primary care provider within one week of discharge.  Follow up with orthopedic surgery, Dr. Lequita HaltAluisio, in one week. Patient should continue medications as prescribed.  Patient should follow a heart healthy/carb modified diet.   Discharge Diagnoses:  Cellulitis of the right upper extremity Diabetes mellitus, Type 1 Hypothyroidism Bovine aortic valve replacement/Coronary artery disease Essential hypertension  Discharge Condition: Stable  Diet recommendation: heart healthy/carb modified  Filed Weights   04/16/18 0902  Weight: 102.1 kg    History of present illness:  on 04/16/2018 by Dr. Desiree LucyVijaya Akula Jeno L Parkesis a 67 y.o.malewith medical history significant of Aortic stenosis, s/p bioprosthetic AV, multiple vessel CAD, carotid artery stenting, DM, Diabetic Neuropathy, GERD, hypothyroidism, hyperlipidemia, OSA, TIA, Stroke reports [place] on [place], [facility] the next day, was discharged on keflex. Returned on 11 th noted to have wound infection, and discharged on clindamycin. Despite being on oral meds pt reports his pain and swelling hasn't improved.but the erythema has improved. [facility]. Pt reports low grade temp, with T max of 100.2, with some chills. No nausea, vomiting, abd pain, headache, diarrhea, chest pain, sob , cough . He was referred to medical service for admission for broad spectrum antibiotics  Hospital Course:  Cellulitis of the right upper extremity -Secondary to fall and possible foreign body -Was  on vancomycin and zosyn -Orthopedic surgery, Dr. Lequita HaltAluisio, consulted and appreciated.  No need for surgical intervention.  Recommended discharge on doxycycline 100mg  BID x 2 weeks.   Diabetes mellitus, Type 1 -Continue insulin pump  Hypothyroidism -Continue Synthroid  Bovine aortic valve replacement/Coronary artery disease -Status post CABG -Currently chest pain-free -Continue Plavix (aspirin to be continued on discharge)  Essential hypertension -Losartan held, BP stable- may restart on discharge  Procedures: None  Consultations: Orthopedic surgery, Dr. Lequita HaltAluisio  Discharge Exam: Vitals:   04/18/18 2103 04/19/18 0423  BP: (!) 156/75 (!) 147/80  Pulse: 68 83  Resp: 20 14  Temp: 98.2 F (36.8 C) 98.5 F (36.9 C)  SpO2: 96% 97%     General: Well developed, well nourished, NAD, appears stated age  HEENT: NCAT, mucous membranes moist.  Neck: Supple  Cardiovascular: S1 S2 auscultated, 3/6 SEM, RRR  Respiratory: Clear to auscultation bilaterally with equal chest rise  Abdomen: Soft, nontender, nondistended, + bowel sounds  Extremities: warm dry without cyanosis clubbing or edema of LE. Mild edema/erythema of RUE with dressing in place  Neuro: AAOx3, nonfocal  Skin: Without rashes exudates or nodules  Psych: Appropriate mood and affect, pleasant   Discharge Instructions Discharge Instructions    Discharge instructions   Complete by:  As directed    Patient will be discharged to home.  Patient will need to follow up with primary care provider within one week of discharge.  Follow up with orthopedic surgery, Dr. Despina HickAlusio, in one week. Patient should continue medications as prescribed.  Patient should follow a heart healthy/carb modified diet.     Allergies as of 04/19/2018      Reactions   Rosuvastatin Calcium Other (See Comments)   Problem  with higher dosages (Lipitor caused memory issues), also caused leg pains and lethargy   Statins Other (See Comments)    Problem with higher dosages (Lipitor caused memory issues), also caused leg pains and lethargy   Tape Rash, Other (See Comments)   Adhesive tape.  (Paper tape OK)      Medication List    STOP taking these medications   clindamycin 300 MG capsule Commonly known as:  CLEOCIN   clotrimazole-betamethasone cream Commonly known as:  LOTRISONE   tadalafil 20 MG tablet Commonly known as:  ADCIRCA/CIALIS     TAKE these medications   aspirin EC 81 MG tablet Take 81 mg by mouth daily.   clopidogrel 75 MG tablet Commonly known as:  PLAVIX TAKE 1 TABLET BY MOUTH EVERY DAY   doxycycline 100 MG capsule Commonly known as:  VIBRAMYCIN Take 1 capsule (100 mg total) by mouth 2 (two) times daily for 14 days.   Evolocumab with Infusor 420 MG/3.5ML Soct Commonly known as:  Repatha Pushtronex System Inject 1 Dose into the skin every 30 (thirty) days.   FEVERFEW PO Take 1 capsule by mouth daily. To prevent migraines   insulin lispro 100 UNIT/ML injection Commonly known as:  HumaLOG Use 60 units via insulin pump.   insulin pump Soln Inject into the skin continuous. Basal rate .95/hr, current pump settings: 12am .7, 2am 1, 7am 1.1, 11am .9, 5pm .9.25, 11pm .85. Uses Novolog Insulin.   levothyroxine 150 MCG tablet Commonly known as:  SYNTHROID, LEVOTHROID Take 1 tablet by mouth daily.   losartan 50 MG tablet Commonly known as:  COZAAR Take 50 mg by mouth daily.   omeprazole 20 MG tablet Commonly known as:  PRILOSEC OTC Take 20 mg by mouth daily.   One-A-Day Mens 50+ Advantage Tabs Take 1 tablet by mouth daily with breakfast.      Allergies  Allergen Reactions  . Rosuvastatin Calcium Other (See Comments)    Problem with higher dosages (Lipitor caused memory issues), also caused leg pains and lethargy  . Statins Other (See Comments)    Problem with higher dosages (Lipitor caused memory issues), also caused leg pains and lethargy  . Tape Rash and Other (See Comments)    Adhesive  tape.  (Paper tape OK)   Follow-up Information    Ollen Gross, MD. Schedule an appointment as soon as possible for a visit on 04/24/2018.   Specialty:  Orthopedic Surgery Why:  Call [phone] today to make the appointment Contact information: [address] [place] [address] [phone]            The results of significant diagnostics from this hospitalization (including imaging, microbiology, ancillary and laboratory) are listed below for reference.    Significant Diagnostic Studies: Dg Elbow Complete Right  Result Date: 04/16/2018 CLINICAL DATA:  Fall 5 days ago with persistent elbow pain. EXAM: RIGHT ELBOW - COMPLETE 3+ VIEW COMPARISON:  None. FINDINGS: Apparent soft tissue swelling about the olecranon process. No associated fracture, radiopaque foreign body or elbow joint effusion. Joint spaces appear preserved. IMPRESSION: Soft tissue swelling about the posterior aspect of the elbow without associated fracture, joint effusion or radiopaque foreign body. Electronically Signed   By: Simonne Come M.D.   On: 04/16/2018 09:50    Microbiology: Recent Results (from the past 240 hour(s))  Culture, blood (Routine X 2) w Reflex to ID Panel     Status: None (Preliminary result)   Collection Time: 04/16/18  9:11 AM  Result Value  Ref Range Status   Specimen Description   Final    BLOOD LEFT ANTECUBITAL Performed at [facility], [address] [place], [place] [address]    Special Requests   Final    BOTTLES DRAWN AEROBIC AND ANAEROBIC Blood Culture adequate volume Performed at [facility], [address] [place], [place] [address]    Culture   Final    NO GROWTH 2 DAYS Performed at [facility], [address] [place], [place] [address]    Report Status PENDING  Incomplete     Labs: Basic Metabolic Panel: Recent Labs  Lab 04/16/18 0911 04/17/18 0247 04/18/18 0429 04/19/18 0329  NA 134* 134* 134*  --   K 4.1 3.7 3.9  --    CL 100 104 103  --   CO2 25 23 24   --   GLUCOSE 276* 214* 250*  --   BUN 20 16 14   --   CREATININE 1.19 0.93 1.05 1.00  CALCIUM 8.3* 7.9* 7.9*  --   PHOS  --   --  3.3  --    Liver Function Tests: Recent Labs  Lab 04/18/18 0429  ALBUMIN 2.7*   No results for input(s): LIPASE, AMYLASE in the last 168 hours. No results for input(s): AMMONIA in the last 168 hours. CBC: Recent Labs  Lab 04/16/18 0911 04/17/18 0247 04/18/18 0429  WBC 7.9 8.1 8.3  NEUTROABS 5.8  --  5.7  HGB 12.5* 11.7* 11.3*  HCT 38.1* 34.8* 33.6*  MCV 92.9 93.8 93.3  PLT 240 227 246   Cardiac Enzymes: No results for input(s): CKTOTAL, CKMB, CKMBINDEX, TROPONINI in the last 168 hours. BNP: BNP (last 3 results) No results for input(s): BNP in the last 8760 hours.  ProBNP (last 3 results) No results for input(s): PROBNP in the last 8760 hours.  CBG: Recent Labs  Lab 04/18/18 0728 04/18/18 1143 04/18/18 1706 04/18/18 2105 04/19/18 0737  GLUCAP 311* 260* 238* 160* 345*       Signed:  Shivon Hackel  [facility] 04/19/2018, 9:06 AM

## 2018-04-19 NOTE — Discharge Instructions (Signed)

## 2018-04-19 NOTE — Progress Notes (Signed)
Discharge instructions given to pt and all questions were answered.  

## 2018-04-19 NOTE — Progress Notes (Signed)
Patient reports to RN this morning that he checked his blood sugar at 0617 am and it was 242 so he gave him self 3.75 units of insulin.

## 2018-04-19 NOTE — Progress Notes (Signed)
Subjective: Patient feeling better. Swelling continues to decrease   Objective: Vital signs in last 24 hours: Temp:  [98.1 F (36.7 C)-98.5 F (36.9 C)] 98.5 F (36.9 C) (04/16 0423) Pulse Rate:  [68-83] 83 (04/16 0423) Resp:  [14-20] 14 (04/16 0423) BP: (147-156)/(70-80) 147/80 (04/16 0423) SpO2:  [93 %-97 %] 97 % (04/16 0423)  Intake/Output from previous day: 04/15 0701 - 04/16 0700 In: 3146.5 [P.O.:960; I.V.:1530.6; IV Piggyback:655.9] Out: 0  Intake/Output this shift: No intake/output data recorded.  Recent Labs    04/16/18 0911 04/17/18 0247 04/18/18 0429  HGB 12.5* 11.7* 11.3*   Recent Labs    04/17/18 0247 04/18/18 0429  WBC 8.1 8.3  RBC 3.71* 3.60*  HCT 34.8* 33.6*  PLT 227 246   Recent Labs    04/17/18 0247 04/18/18 0429 04/19/18 0329  NA 134* 134*  --   K 3.7 3.9  --   CL 104 103  --   CO2 23 24  --   BUN 16 14  --   CREATININE 0.93 1.05 1.00  GLUCOSE 214* 250*  --   CALCIUM 7.9* 7.9*  --    No results for input(s): LABPT, INR in the last 72 hours.  Compartment soft Cellulitis in forearm essentially gone. Minimal redness adjacent to incision. moving fingers well. Minimal hand swelling    Assessment/Plan: Right arm cellulitis- Doing much better. Discontinue IV antibiotics and switch to doxycycline 100 mg po bid for 2 weeks . Follow up in office on Tuesday 4/21     [name] 04/19/2018, 7:37 AM

## 2018-04-21 LAB — CULTURE, BLOOD (ROUTINE X 2)
Culture: NO GROWTH
Special Requests: ADEQUATE

## 2018-04-30 DIAGNOSIS — S51019A Laceration without foreign body of unspecified elbow, initial encounter: Secondary | ICD-10-CM | POA: Insufficient documentation

## 2018-06-19 DIAGNOSIS — N1831 Chronic kidney disease, stage 3a: Secondary | ICD-10-CM | POA: Insufficient documentation

## 2018-06-19 DIAGNOSIS — N182 Chronic kidney disease, stage 2 (mild): Secondary | ICD-10-CM | POA: Insufficient documentation

## 2018-10-12 DIAGNOSIS — H33001 Unspecified retinal detachment with retinal break, right eye: Secondary | ICD-10-CM | POA: Insufficient documentation

## 2018-11-07 ENCOUNTER — Telehealth: Payer: Self-pay | Admitting: Internal Medicine

## 2018-11-07 NOTE — Telephone Encounter (Signed)
repatha patient assistance application mailed with instruction to return & complete also mailed lipid panel/LP(a) to be completed before 12/18/2018 visit

## 2018-11-21 DIAGNOSIS — D649 Anemia, unspecified: Secondary | ICD-10-CM | POA: Insufficient documentation

## 2018-12-18 ENCOUNTER — Ambulatory Visit: Payer: PRIVATE HEALTH INSURANCE | Admitting: Internal Medicine

## 2019-01-15 ENCOUNTER — Telehealth: Payer: Self-pay | Admitting: Internal Medicine

## 2019-01-15 NOTE — Telephone Encounter (Signed)
RECEIVED  FORMS AND PLACED IN BOX( FILING ROOM

## 2019-01-15 NOTE — Telephone Encounter (Signed)
Patient would like Jenna to call him as soon as she can.

## 2019-01-15 NOTE — Telephone Encounter (Signed)
Patent aware [name] is out of office. Patient will be  Faxing inforamtion for renewing repatha . Fax number given awaiting  For forms

## 2019-01-16 NOTE — Telephone Encounter (Signed)
Paperwork received and reviewed. Patient had annual income listed at $95,000 for household of 2 and income cut-off is $86,200 for household of 2 for approval for Repatha assistance.  LM for patient with this info and that I will send a MyChart message with [employer] information.

## 2019-01-18 MED ORDER — REPATHA PUSHTRONEX SYSTEM 420 MG/3.5ML ~~LOC~~ SOCT: 1.0000 | SUBCUTANEOUS | 12 refills | Status: DC

## 2019-01-18 NOTE — Addendum Note (Signed)
Addended by: Lindell Spar on: 01/18/2019 02:58 PM   Modules accepted: Orders

## 2019-01-29 ENCOUNTER — Other Ambulatory Visit: Payer: Self-pay

## 2019-01-29 ENCOUNTER — Ambulatory Visit: Payer: PRIVATE HEALTH INSURANCE | Admitting: Internal Medicine

## 2019-01-29 ENCOUNTER — Encounter: Payer: Self-pay | Admitting: Internal Medicine

## 2019-01-29 VITALS — BP 150/80 | HR 95 | Temp 97.5°F | Ht 74.0 in | Wt 238.6 lb

## 2019-01-29 DIAGNOSIS — E785 Hyperlipidemia, unspecified: Secondary | ICD-10-CM

## 2019-01-29 DIAGNOSIS — M791 Myalgia, unspecified site: Secondary | ICD-10-CM

## 2019-01-29 DIAGNOSIS — Z951 Presence of aortocoronary bypass graft: Secondary | ICD-10-CM

## 2019-01-29 DIAGNOSIS — T466X5A Adverse effect of antihyperlipidemic and antiarteriosclerotic drugs, initial encounter: Secondary | ICD-10-CM

## 2019-01-29 DIAGNOSIS — I739 Peripheral vascular disease, unspecified: Secondary | ICD-10-CM

## 2019-01-29 NOTE — Progress Notes (Signed)
OFFICE NOTE  Chief Complaint:  Lipid clinic follow-up  Primary Carter Physician: Bailey Mech, PA-C  HPI:  [name] is a 69 y.o. 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  He also underwent bovine pericardial aortic valve replacement at that time.  In addition he has a history of dyslipidemia with statin intolerance.  He reports taking high-dose atorvastatin and rosuvastatin which caused both myalgias, lethargy and memory problems.  In addition he said he took ezetimibe in the past which she thought also cause some memory problems.  He was referred for evaluation of PCSK9 inhibitor.  Actually he was recently started on Repatha and has had 5 doses of the medication.  Lipid profile 7 months ago indicated total cholesterol 245, triglycerides 113, HDL 58 and LDL 164.  His most recent lipid profile 2 weeks ago showed a total cholesterol 202, triglycerides 70, HDL 75 and LDL 113.  There is concerned about incomplete response to Repatha, but it should be noted that his calculated LDL was lower, but is misrepresented as his HDL fraction has gone up from 58-75.  That being said his actual LDL is probably higher than represented.  He does report compliance with the medication but he says the injector pen does cause him discomfort.  He also uses an insulin pump and is fairly used to needles.  02/07/2018  [name] is seen today in follow-up.  He is done well with the Repatha Pushtronex system.  He is now using that once monthly.  He does get some occasional injection site redness with use but typically that improves after 2 days.  His cholesterol profile is also favorably responded to the medication.  His total cholesterol now as of January 12, 2018 is 176, HDL 72, LDL 94 and triglycerides 52.  He denies any other myalgias or other side effects that he had with statins.  He is  actually quite close to goal LDL less than 70.  Interestingly, he has not had a robust response as expected with the Repatha, namely a 60 to 65% reduction although does seem to have a little more benefit on the high once monthly dose.  He previously had been on the every 2 weekly Repatha dose with suboptimal response, suggesting that there may be PCSK9 or LDL mutation or perhaps a large fraction of his LDL is LP(a).  01/29/2019  [name] returns today for lipid clinic follow-up.  He was previously being seen by Dr. Gery Pray however switched his Carter to Dr. Onalee Hua at [facility] due to a change in his insurance.  Currently he has [employer].  Despite this, he wishes to continue to follow here for lipid management.  Recently he had lipids drawn at [facility], those were in November 2020.  Total cholesterol was 150, triglycerides 58, HDL 68 and a direct LDL was 81.  He remains on the Repatha Pushtronex system.  At some point he notes that he was started on Vascepa.  He was advised to take 2 g twice daily which is the recommended dose however he has been taking only 1 g daily.  After extensive review of his previous lipids however I do not see any evidence that he has had elevated triglycerides over 150, which means use of Vascepa is not based on any guideline directed indications or supported by current literature.  In fact  his low triglycerides are not predictive of further increase cardiovascular risk.  PMHx:  Past Medical History:  Diagnosis Date  . Abnormal stress test 10/27/2011   lat isch--cardiac cath 10/28/2011  . Aortic stenosis, moderate Sept/Oct /2013   2017-> s/p bioprosthetic AV [facility] Ease bovine pericardial valve model 3300 TFX, ser # [id number]).  . 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 Inferolateral perfusion defect + EKG abnormality during stress testing prompted Cath by [facility]: 3V CAD, EF normal.  Med mgmt recommended; b. 06/2015 Cath: 3VD and mod AS; c. 07/2015 CABG x 3  (LIMA->LAD, VG->OM2, VG->PDA) w/ AVR.  [name] Carotid stenosis, right 09/2011   Dr. [name] did R carotid stenting 01/2014.  Carotid dopplers 06/2015 essentially normal.  No change 07/2016--repeat 1 yr.  . Complication of anesthesia    difficulty with intubation,   . Decreased pedal pulses    LE doppler 11/08/11- no evidence of arterial insufficiency  . Diabetes mellitus Dx'd age 20   Novolog via insulin pump; sub-optimal control x years  . Diabetic neuropathy (HCC)    fine touch and position sense affected  . Diabetic retinopathy    Proliferative: Hx of retinal detachment on right-light perception only in right eye  . Diastolic dysfunction 2007; 2016   TEE with diastolic dysfunction, EF 74%.  . Difficult intubation    ~ 2002 difficult fiberoptic intubation with takeback bleeding s/p thyroidectomy;  for thyroidectomy was intubated DL X 1 with cricoid pressure but difficult mask (full beard)  . Dizziness    feels off balance  . Erectile dysfunction    [place]rologist [place]  . GERD (gastroesophageal reflux disease)   . History of tobacco abuse    Quit about 1990 (has 35 pack-yr hx)  . Hyperlipemia, mixed    a. intolerant of statins/zeta; per Dr. [name] 12/2016, pt started on PCSK9--incomplete responder?---01/2017.  [name] Hypothyroidism, postsurgical    Thyroidectomy 2002; multinodular goiter.  Dr. Elvera Lennox managing this as of 10/2015.  . Impaired vision    right eye light perception only; hx of retinal detachment.  . Impingement syndrome of right shoulder    08/26/15 Pt got subacromial steroid injection by orthopedist (Dr. Delfin Edis).  [facility] f/u 01/2016--MRI showed RC tear + AC joint arthritis, arthroscopic surgery done 03/2016 (ortho-[facility]).  . Neuromuscular disorder (HCC)    neuropathy  . OSA (obstructive sleep apnea)    06/2011 sleep study: moderate OSA, CPAP at 12 cm H2O.  . Osteoarthritis    Bilat thumb carpometacarpal joints.  Ortho injected each thumb x 2 in 2017.  Also injected  03/14/17.  [name] Recurrent pneumonia   . Shortness of breath dyspnea   . Stroke [facility])    TIA  . TIA (transient ischemic attack) 2015/16   with R carotid dz: pt got R carotid stent 01/2014 by Dr. [name] and was put on dual antiplatelet therapy with ASA 325mg  and plavix 75mg  qd afterwards  . TIA (transient ischemic attack) 05/2015   Left brain (right sided numbness + slurred speech)  Admitted for obs/workup 05/2015.  06/2015 Venous insufficiency   . Venous reflux 10/14/2011   venous doppler-R GSV continuous reflux throughout; too small for VNUS closure    Past Surgical History:  Procedure Laterality Date  . ABI  01/2016   Normal.  Dr. 12/14/2011 to repeat in 1 yr.  . AORTIC VALVE REPLACEMENT N/A 07/09/2015   Bovine pericardial valve.  Procedure: AORTIC VALVE REPLACEMENT (AVR);  Surgeon: [name], MD;  Location:  [facility] OR;  Service: Open Heart Surgery;  Laterality: N/A;  . CARDIAC CATHETERIZATION  10/2011   EF normal.  Diffuse 3 vessel CAD, no stents placed.  Medical mgmt per [facility].  [name] CARDIAC CATHETERIZATION N/A 06/22/2015   Procedure: Right/Left Heart Cath and Coronary Angiography;  Surgeon: Runell Gess, MD;  Location: [facility] INVASIVE CV LAB;  Service: Cardiovascular;  Laterality: N/A;  . carotid dopplers  06/30/15   Normal: repeat when clinically indicated per Dr. [name]  . CAROTID STENT INSERTION Right 01/16/2014   Procedure: CAROTID STENT INSERTION;  Surgeon: Runell Gess, MD;  Location: [facility] CATH LAB;  Service: Cardiovascular;  Laterality: Right;  . CATARACT EXTRACTION     left  . CORONARY ARTERY BYPASS GRAFT N/A 07/09/2015   Procedure: CORONARY ARTERY BYPASS GRAFTING (CABG)TIMES THREE USING LEFT INTERNAL MAMMARY ARTERY AND LEFT SAPHENOUS LEG VEIN HARVESTED ENDOSCOPICALLY;  Surgeon: Loreli Slot, MD;  Location[facility] OR;  Service: Open Heart Surgery;  Laterality: N/A;  . EYE SURGERY     Multiple laser surgeries for diabetic retinopathy, also cataract surgery OU.  [name] INTRAOPERATIVE  TRANSESOPHAGEAL ECHOCARDIOGRAM N/A 07/09/2015   Procedure: INTRAOPERATIVE TRANSESOPHAGEAL ECHOCARDIOGRAM;  Surgeon: Loreli Slot, MD;  Location: [facility] OR;  Service: Open Heart Surgery;  Laterality: N/A;  . lasix eye    . LEFT AND RIGHT HEART CATHETERIZATION WITH CORONARY ANGIOGRAM N/A 10/28/2011   Procedure: LEFT AND R0IHT HEART CATHETERIZATION WITH CORONARY ANGIOGRAM;  Surgeon: Lennette Bihari, MD;  Location: [facility] CATH LAB;  Service: Cardiovascular;  Laterality: N/A;  . RETINAL DETACHMENT SURGERY     Right eye  . ROTATOR CUFF REPAIR W/ DISTAL CLAVICLE EXCISION Right 03/2016   Arthroscopic ([facility])  . THYROID SURGERY  2002  . TRANSTHORACIC ECHOCARDIOGRAM  10/2011;12/2012;01/2015; 08/2015; 07/28/16   Mod AS, mild LVH, EF 60-65%, no RWMA.  01/2015 EF 60-65%, grade I DD, progression of mod/sev AS.  08/2015 normal lv fxn with normal fxning bioprosthetic Ao valve.  07/2016--no change compared to 2017 echo.      FAMHx:  Family History  Problem Relation Age of Onset  . Cancer Mother        lung cancer  . Alcohol abuse Father   . Cancer Father        laryngeal cancer  . Cancer Brother        oldest brother had lung cancer and melanoma    SOCHx:   reports that he has quit smoking. He quit smokeless tobacco use about 31 years ago. He reports current alcohol use. He reports that he does not use drugs.  ALLERGIES:  Allergies  Allergen Reactions  . Rosuvastatin Calcium Other (See Comments)    Problem with higher dosages (Lipitor caused memory issues), also caused leg pains and lethargy  . Statins Other (See Comments)    Problem with higher dosages (Lipitor caused memory issues), also caused leg pains and lethargy  . Tape Rash and Other (See Comments)    Adhesive tape.  (Paper tape OK)    ROS: Pertinent items noted in HPI and remainder of comprehensive ROS otherwise negative.  HOME MEDS: Current Outpatient Medications on File Prior to Visit  Medication Sig Dispense Refill  . aspirin  EC 81 MG tablet Take 81 mg by mouth daily.    . clopidogrel (PLAVIX) 75 MG tablet TAKE 1 TABLET BY MOUTH EVERY DAY 90 tablet 3  . Evolocumab with Infusor (REPATHA PUSHTRONEX SYSTEM) 420 MG/3.5ML SOCT Inject 1 Dose into the skin every 30 (thirty) days.  3.5 mL 12  . FEVERFEW PO Take 1 capsule by mouth daily. To prevent migraines    . Insulin Human (INSULIN PUMP) SOLN Inject into the skin continuous. Basal rate .95/hr, current pump settings: 12am .7, 2am 1, 7am 1.1, 11am .9, 5pm .9.25, 11pm .85. Uses Novolog Insulin.    [name] insulin lispro (HUMALOG) 100 UNIT/ML injection Use 60 units via insulin pump. 90 mL 2  . levothyroxine (SYNTHROID, LEVOTHROID) 150 MCG tablet Take 1 tablet by mouth daily.    [name] losartan (COZAAR) 50 MG tablet Take 50 mg by mouth daily.    . Multiple Vitamins-Minerals (ONE-A-DAY MENS 50+ ADVANTAGE) TABS Take 1 tablet by mouth daily with breakfast.    . omeprazole (PRILOSEC OTC) 20 MG tablet Take 20 mg by mouth daily.     No current facility-administered medications on file prior to visit.    LABS/IMAGING: No results found for this or any previous visit (from the past 48 hour(s)). No results found.  LIPID PANEL:    Component Value Date/Time   CHOL 176 01/12/2018 0954   TRIG 52 01/12/2018 0954   HDL 72 01/12/2018 0954   CHOLHDL 2.4 01/12/2018 0954   CHOLHDL 4.0 05/07/2015 0557   VLDL 15 05/07/2015 0557   LDLCALC 94 01/12/2018 0954   LDLDIRECT 159.5 04/21/2011 0843     WEIGHTS: Wt Readings from Last 3 Encounters:  01/29/19 238 lb 9.6 oz (108.2 kg)  04/16/18 225 lb (102.1 kg)  04/14/18 225 lb (102.1 kg)    VITALS: BP (!) 150/80   Pulse 95   Temp (!) 97.5 F (36.4 C)   Ht 6\' 2"  (1.88 m)   Wt 238 lb 9.6 oz (108.2 kg)   SpO2 96%   BMI 30.63 kg/m   EXAM: Deferred  EKG: Deferred  ASSESSMENT: 1. Mixed dyslipidemia, goal LDL less than 70 2. ASCVD status post 3 vessel CABG and bioprosthetic AVR (2017) 3. Statin intolerance -  myalgias 4. PAD 5. IDDM  PLAN: 1.   Mr. [name] remains above goal LDL less than 70 however his numbers have improved.  He is lost a little weight and made changes in his diet.  I have encouraged him to continue this.  We previously discussed possibly adding ezetimibe to his regimen.  This could further help to target his LDL to goal.  I do not see any indication for Vascepa and he was hesitant to take the full dose anyhow.  He will likely discontinue this off label use.  Why do believe it is an excellent medicine and as he is a diabetic there is evidence that there is benefit in this population however in the reduce it trial, patients were at goal LDL and had persistently elevated triglycerides over 150.  He is not representative of this population.  Follow-up 6 months.  Pixie Casino, MD, [facility], [place] Director of the Advanced Lipid Disorders &  Cardiovascular Risk Reduction Clinic Diplomate of the American Board of Clinical Lipidology Attending Cardiologist  Direct Dial: [phone]  Fax: [phone]  Website:  [email or web address] 01/29/2019, 4:37 PM

## 2019-01-29 NOTE — Patient Instructions (Signed)
Medication Instructions:  Dr. Rennis Golden recommends that you discontinue Vascepa *If you need a refill on your cardiac medications before your next appointment, please call your pharmacy*  Lab Work: FASTING lab work to check cholesterol in 6 months  If you have labs (blood work) drawn today and your tests are completely normal, you will receive your results only by: [name] MyChart Message (if you have MyChart) OR . A paper copy in the mail If you have any lab test that is abnormal or we need to change your treatment, we will call you to review the results.  Follow-Up: At [facility], you and your health needs are our priority.  As part of our continuing mission to provide you with exceptional heart care, we have created designated Provider Care Teams.  These Care Teams include your primary Cardiologist (physician) and Advanced Practice Providers (APPs -  Physician Assistants and Nurse Practitioners) who all work together to provide you with the care you need, when you need it.  Your next appointment:   6 month(s) - lipid clinic  The format for your next appointment:   Either In Person or Virtual  Provider:   K. Italy Hilty, MD  Other Instructions

## 2019-06-07 ENCOUNTER — Telehealth: Payer: Self-pay | Admitting: Internal Medicine

## 2019-06-07 NOTE — Telephone Encounter (Signed)
Spoke with Eunice Blase at [employer]. Okay to replace Repatha syringe that is broken.

## 2019-06-07 NOTE — Telephone Encounter (Signed)
A call may be returned to [facility] at [phone] (reference #: [id number]).

## 2019-06-07 NOTE — Telephone Encounter (Signed)
Pt c/o medication issue:  1. Name of Medication: Evolocumab with Infusor (REPATHA PUSHTRONEX SYSTEM) 420 MG/3.5ML SOCT  2. How are you currently taking this medication (dosage and times per day)? N/A  3. Are you having a reaction (difficulty breathing--STAT)? N/A  4. What is your medication issue? Marcelino Duster with [facility] is requesting verbal consent to sent the patient a new RX for medication. She states, per the patient, the medication is damaged. Please call to confirm.

## 2019-07-17 ENCOUNTER — Other Ambulatory Visit: Payer: Self-pay | Admitting: Orthopaedic Surgery

## 2019-07-17 ENCOUNTER — Other Ambulatory Visit (HOSPITAL_BASED_OUTPATIENT_CLINIC_OR_DEPARTMENT_OTHER): Payer: Self-pay | Admitting: Orthopaedic Surgery

## 2019-07-17 ENCOUNTER — Ambulatory Visit (HOSPITAL_BASED_OUTPATIENT_CLINIC_OR_DEPARTMENT_OTHER)
Admission: RE | Admit: 2019-07-17 | Discharge: 2019-07-17 | Disposition: A | Discharge status: Home / Self Care | Payer: Worker's Compensation | Source: Ambulatory Visit | Attending: Orthopaedic Surgery | Admitting: Orthopaedic Surgery

## 2019-07-17 ENCOUNTER — Other Ambulatory Visit: Payer: Self-pay

## 2019-07-17 DIAGNOSIS — M25571 Pain in right ankle and joints of right foot: Secondary | ICD-10-CM | POA: Diagnosis present

## 2019-07-18 NOTE — Pre-Procedure Instructions (Signed)
[name]  07/18/2019    Your procedure is scheduled on Tuesday, July 23, 2019 at 3:30 PM.   Report to [facility] Entrance "A" Admitting Office at 1:30 PM.   Call this number if you have problems the morning of surgery: [phone]   Questions prior to day of surgery, please call [phone] between 8 & 4 PM.   Remember:  Do not eat food after midnight Monday, 07/22/19.  You may drink clear liquids until 12:30 PM. Clear liquids allowed are: Water, Gatorade, Black Coffee, Clear tea, carbonated beverages, clear juices (no pulp) - Please choose low sugar or diet clear liquids.  Drink the Gatorade G2 that you were given at your Pre-Admission Testing appointment just prior to 12:30 PM day of surgery. This will be the last liquid that you will have prior to surgery.    Take these medicines the morning of surgery with A SIP OF WATER: Aspirin, Levothyroxine (Synthroid), Omeprazole (Prilosec)  Stop Plavix as instructed by your cardiologist. Stop Multivitamins and Feverfew as of today prior to surgery. Do not use NSAIDS (Ibuprofen, Aleve, etc), other Aspirin containing products or Fish Oil prior to surgery.   How to Manage Your Diabetes Before Surgery   Why is it important to control my blood sugar before and after surgery?   Improving blood sugar levels before and after surgery helps healing and can limit problems.  A way of improving blood sugar control is eating a healthy diet by:  - Eating less sugar and carbohydrates  - Increasing activity/exercise  - Talk with your doctor about reaching your blood sugar goals  High blood sugars (greater than 180 mg/dL) can raise your risk of infections and slow down your recovery so you will need to focus on controlling your diabetes during the weeks before surgery.  Make sure that the doctor who takes care of your diabetes knows about your planned surgery including the date and location.  How do I manage my blood sugars before  surgery?   Check your blood sugar at least 4 times a day, 2 days before surgery to make sure that they are not too high or low.  Check your blood sugar the morning of your surgery when you wake up and every 2 hours until you get to the Short-Stay unit.  Treat a low blood sugar (less than 70 mg/dL) with 1/2 cup of clear juice (cranberry or apple), 4 glucose tablets, OR glucose gel.  Recheck blood sugar in 15 minutes after treatment (to make sure it is greater than 70 mg/dL).  If blood sugar is not greater than 70 mg/dL on re-check, call [phone] for further instructions.   Report your blood sugar to the Short-Stay nurse when you get to Short-Stay.  References:  [facility], 2007 "How to Manage your Diabetes Before and After Surgery".  What do I do about my diabetes medications?   For patients with "Insulin Pumps":  Contact your diabetes doctor for specific instructions before surgery.   Decrease basal insulin rates by 20% at midnight the night before surgery.  Note that if your surgery is planned to be longer than 2 hours, your insulin pump will be removed and intravenous (IV) insulin will be started and managed by the nurses and anesthesiologist.  You will be able to restart your insulin pump once you are awake and able to manage it.  Make sure to bring insulin pump supplies to the hospital with you in case your  site needs to be changed.     Do not wear jewelry.  Do not wear lotions, powders, cologne or deodorant.  Men may shave face and neck.  Do not bring valuables to the hospital.  [facility] is not responsible for any belongings or valuables.  Contacts, dentures or bridgework may not be worn into surgery.  Leave your suitcase in the car.  After surgery it may be brought to your room.  For patients admitted to the hospital, discharge time will be determined by your treatment team.  Patients discharged the day of surgery will not be allowed to  drive home.   [place] - Preparing for Surgery  Before surgery, you can play an important role.  Because skin is not sterile, your skin needs to be as free of germs as possible.  You can reduce the number of germs on you skin by washing with CHG (chlorahexidine gluconate) soap before surgery.  CHG is an antiseptic cleaner which kills germs and bonds with the skin to continue killing germs even after washing.  Oral Hygiene is also important in reducing the risk of infection.  Remember to brush your teeth with your regular toothpaste the morning of surgery.  Please DO NOT use if you have an allergy to CHG or antibacterial soaps.  If your skin becomes reddened/irritated stop using the CHG and inform your nurse when you arrive at Short Stay.  Do not shave (including legs and underarms) for at least 48 hours prior to the first CHG shower.  You may shave your face.  Please follow these instructions carefully:   1.  Shower with CHG Soap the night before surgery and the morning of Surgery.  2.  If you choose to wash your hair, wash your hair first as usual with your normal shampoo.  3.  After you shampoo, rinse your hair and body thoroughly to remove the shampoo. 4.  Use CHG as you would any other liquid soap.  You can apply chg directly to the skin and wash gently with a      scrungie or washcloth.           5.  Apply the CHG Soap to your body ONLY FROM THE NECK DOWN.   Do not use on open wounds or open sores. Avoid contact with your eyes, ears, mouth and genitals (private parts).  Wash genitals (private parts) with your normal soap - do this prior to using CHG soap.  6.  Wash thoroughly, paying special attention to the area where your surgery will be performed.  7.  Thoroughly rinse your body with warm water from the neck down.  8.  DO NOT shower/wash with your normal soap after using and rinsing off the CHG Soap.  9.  Pat yourself dry with a clean towel.            10.  Wear clean pajamas.             11.  Place clean sheets on your bed the night of your first shower and do not sleep with pets.  Day of Surgery  Shower as above. Do not apply any lotions/deodorants the morning of surgery.   Please wear clean clothes to the hospital. Remember to brush your teeth with toothpaste.  Please read over the fact sheets that you were given.

## 2019-07-19 ENCOUNTER — Encounter (HOSPITAL_COMMUNITY): Payer: Self-pay

## 2019-07-19 ENCOUNTER — Other Ambulatory Visit: Payer: Self-pay

## 2019-07-19 ENCOUNTER — Other Ambulatory Visit (HOSPITAL_COMMUNITY)
Admission: RE | Admit: 2019-07-19 | Discharge: 2019-07-19 | Disposition: A | Discharge status: Home / Self Care | Payer: PRIVATE HEALTH INSURANCE | Source: Ambulatory Visit | Attending: Orthopaedic Surgery | Admitting: Orthopaedic Surgery

## 2019-07-19 ENCOUNTER — Encounter (HOSPITAL_COMMUNITY)
Admission: RE | Admit: 2019-07-19 | Discharge: 2019-07-19 | Disposition: A | Discharge status: Home / Self Care | Payer: PRIVATE HEALTH INSURANCE | Source: Ambulatory Visit | Attending: Orthopaedic Surgery | Admitting: Orthopaedic Surgery

## 2019-07-19 DIAGNOSIS — E785 Hyperlipidemia, unspecified: Secondary | ICD-10-CM | POA: Insufficient documentation

## 2019-07-19 DIAGNOSIS — I080 Rheumatic disorders of both mitral and aortic valves: Secondary | ICD-10-CM | POA: Diagnosis not present

## 2019-07-19 DIAGNOSIS — E1140 Type 2 diabetes mellitus with diabetic neuropathy, unspecified: Secondary | ICD-10-CM | POA: Diagnosis not present

## 2019-07-19 DIAGNOSIS — G4733 Obstructive sleep apnea (adult) (pediatric): Secondary | ICD-10-CM | POA: Diagnosis not present

## 2019-07-19 DIAGNOSIS — Z87891 Personal history of nicotine dependence: Secondary | ICD-10-CM | POA: Insufficient documentation

## 2019-07-19 DIAGNOSIS — K219 Gastro-esophageal reflux disease without esophagitis: Secondary | ICD-10-CM | POA: Insufficient documentation

## 2019-07-19 DIAGNOSIS — Z794 Long term (current) use of insulin: Secondary | ICD-10-CM | POA: Insufficient documentation

## 2019-07-19 DIAGNOSIS — Z79899 Other long term (current) drug therapy: Secondary | ICD-10-CM | POA: Diagnosis not present

## 2019-07-19 DIAGNOSIS — S82831A Other fracture of upper and lower end of right fibula, initial encounter for closed fracture: Secondary | ICD-10-CM | POA: Diagnosis not present

## 2019-07-19 DIAGNOSIS — Z20822 Contact with and (suspected) exposure to covid-19: Secondary | ICD-10-CM | POA: Diagnosis not present

## 2019-07-19 DIAGNOSIS — I872 Venous insufficiency (chronic) (peripheral): Secondary | ICD-10-CM | POA: Diagnosis not present

## 2019-07-19 DIAGNOSIS — I2510 Atherosclerotic heart disease of native coronary artery without angina pectoris: Secondary | ICD-10-CM | POA: Insufficient documentation

## 2019-07-19 DIAGNOSIS — E890 Postprocedural hypothyroidism: Secondary | ICD-10-CM | POA: Diagnosis not present

## 2019-07-19 DIAGNOSIS — Z8673 Personal history of transient ischemic attack (TIA), and cerebral infarction without residual deficits: Secondary | ICD-10-CM | POA: Diagnosis not present

## 2019-07-19 DIAGNOSIS — Z952 Presence of prosthetic heart valve: Secondary | ICD-10-CM | POA: Diagnosis not present

## 2019-07-19 DIAGNOSIS — Z01812 Encounter for preprocedural laboratory examination: Secondary | ICD-10-CM | POA: Insufficient documentation

## 2019-07-19 DIAGNOSIS — Y929 Unspecified place or not applicable: Secondary | ICD-10-CM | POA: Insufficient documentation

## 2019-07-19 DIAGNOSIS — Z7902 Long term (current) use of antithrombotics/antiplatelets: Secondary | ICD-10-CM | POA: Insufficient documentation

## 2019-07-19 DIAGNOSIS — Z7982 Long term (current) use of aspirin: Secondary | ICD-10-CM | POA: Diagnosis not present

## 2019-07-19 DIAGNOSIS — Y939 Activity, unspecified: Secondary | ICD-10-CM | POA: Insufficient documentation

## 2019-07-19 DIAGNOSIS — Z951 Presence of aortocoronary bypass graft: Secondary | ICD-10-CM | POA: Insufficient documentation

## 2019-07-19 HISTORY — DX: Headache, unspecified: R51.9

## 2019-07-19 LAB — BASIC METABOLIC PANEL
Anion gap: 8 (ref 5–15)
BUN: 19 mg/dL (ref 8–23)
CO2: 26 mmol/L (ref 22–32)
Calcium: 9 mg/dL (ref 8.9–10.3)
Chloride: 101 mmol/L (ref 98–111)
Creatinine, Ser: 1.12 mg/dL (ref 0.61–1.24)
GFR calc Af Amer: >60 mL/min (ref >60)
GFR calc non Af Amer: >60 mL/min (ref >60)
Glucose, Bld: 237 mg/dL — ABNORMAL HIGH (ref 70–99)
Potassium: 5.4 mmol/L — ABNORMAL HIGH (ref 3.5–5.1)
Sodium: 135 mmol/L (ref 135–145)

## 2019-07-19 LAB — CBC
HCT: 37.2 % — ABNORMAL LOW (ref 39.0–52.0)
Hemoglobin: 12.3 g/dL — ABNORMAL LOW (ref 13.0–17.0)
MCH: 30.4 pg (ref 26.0–34.0)
MCHC: 33.1 g/dL (ref 30.0–36.0)
MCV: 92.1 fL (ref 80.0–100.0)
Platelets: 324 10*3/uL (ref 150–400)
RBC: 4.04 MIL/uL — ABNORMAL LOW (ref 4.22–5.81)
RDW: 12.1 % (ref 11.5–15.5)
WBC: 7.3 10*3/uL (ref 4.0–10.5)
nRBC: 0 % (ref 0.0–0.2)

## 2019-07-19 LAB — SURGICAL PCR SCREEN
MRSA, PCR: NEGATIVE
Staphylococcus aureus: NEGATIVE

## 2019-07-19 LAB — HEMOGLOBIN A1C
Hgb A1c MFr Bld: 8.5 % — ABNORMAL HIGH (ref 4.8–5.6)
Mean Plasma Glucose: 197.25 mg/dL

## 2019-07-19 LAB — GLUCOSE, CAPILLARY: Glucose-Capillary: 239 mg/dL — ABNORMAL HIGH (ref 70–99)

## 2019-07-19 NOTE — Pre-Procedure Instructions (Signed)
[name]  07/19/2019    Your procedure is scheduled on Tuesday, July 23, 2019.  Report to [facility] Entrance "A" Admitting Office at 1:30 PM.                Your surgery or procedure is scheduled to begin at 3:30 PM   Call this number if you have problems the morning of surgery: [phone]- pre- surgery desk.  Questions prior to day of surgery, please call [phone] between 8 & 4 PM.   Remember:  Do not eat food after midnight Monday, 07/22/19.  You may drink clear liquids until 12:30 PM. Clear liquids allowed are: Water, Gatorade, Black Coffee, Clear tea, carbonated beverages, clear juices (no pulp) - Please choose low sugar or diet clear liquids.  Drink the Gatorade G2 that you were given at your Pre-Admission Testing appointment just prior to 12:30 PM day of surgery. This will be the last liquid that you will have prior to surgery.    Take these medicines the morning of surgery with A SIP OF WATER: Aspirin, Levothyroxine (Synthroid), Omeprazole (Prilosec)  Stop Plavix as instructed by your cardiologist. Stop Multivitamins and Feverfew as of today prior to surgery. Do not use NSAIDS (Ibuprofen, Aleve, etc), other Aspirin containing products or Fish Oil prior to surgery.   How to Manage Your Diabetes Before Surgery   Why is it important to control my blood sugar before and after surgery?   Improving blood sugar levels before and after surgery helps healing and can limit problems.  A way of improving blood sugar control is eating a healthy diet by:  - Eating less sugar and carbohydrates  - Increasing activity/exercise  - Talk with your doctor about reaching your blood sugar goals  High blood sugars (greater than 180 mg/dL) can raise your risk of infections and slow down your recovery so you will need to focus on controlling your diabetes during the weeks before surgery.  Make sure that the doctor who takes care of your diabetes knows about your planned  surgery including the date and location.  How do I manage my blood sugars before surgery?   Check your blood sugar at least 4 times a day, 2 days before surgery to make sure that they are not too high or low.  Check your blood sugar the morning of your surgery when you wake up and every 2 hours until you get to the Short-Stay unit.  Treat a low blood sugar (less than 70 mg/dL) with 1/2 cup of clear juice (cranberry or apple), 4 glucose tablets, OR glucose gel.  Recheck blood sugar in 15 minutes after treatment (to make sure it is greater than 70 mg/dL).  If blood sugar is not greater than 70 mg/dL on re-check, call [phone] for further instructions.   Report your blood sugar to the Short-Stay nurse when you get to Short-Stay.  References:  [facility], 2007 "How to Manage your Diabetes Before and After Surgery".  What do I do about my diabetes medications?   For patients with "Insulin Pumps":  Contact your diabetes doctor for specific instructions before surgery.   Decrease basal insulin rates by 20% at midnight the night before surgery.  Note that if your surgery is planned to be longer than 2 hours, your insulin pump will be removed and intravenous (IV) insulin will be started and managed by the nurses and anesthesiologist.  You will be able to restart your insulin pump once  you are awake and able to manage it.  Make sure to bring insulin pump supplies to the hospital with you in case your site needs to be changed.      [place] - Preparing for Surgery  Before surgery, you can play an important role.  Because skin is not sterile, your skin needs to be as free of germs as possible.  You can reduce the number of germs on you skin by washing with CHG (chlorahexidine gluconate) soap before surgery.  CHG is an antiseptic cleaner which kills germs and bonds with the skin to continue killing germs even after washing.  Oral Hygiene is also important in  reducing the risk of infection.  Remember to brush your teeth with your regular toothpaste the morning of surgery.  Please DO NOT use if you have an allergy to CHG or antibacterial soaps.  If your skin becomes reddened/irritated stop using the CHG and inform your nurse when you arrive at Short Stay.   Please follow these instructions carefully:   1.  Shower with CHG Soap the night before surgery and the morning of Surgery.  2.  If you choose to wash your hair, wash your hair first as usual with your normal shampoo.  3.  After you shampoo, wash your face and private area with the soap you use at home, then rinse your hair and body thoroughly to remove the shampoo and soap. 4.  Use CHG as you would any other liquid soap.  You can apply chg directly to the skin and wash gently with a      scrungie or washcloth.           5.  Apply the CHG Soap to your body ONLY FROM THE NECK DOWN.   Do not use on open wounds or open sores. Avoid contact with your eyes, ears, mouth and genitals (private parts).  .  6.  Wash thoroughly, paying special attention to the area where your surgery will be performed.  7.  Thoroughly rinse your body with warm water from the neck down.  8.  DO NOT shower/wash with your normal soap after using and rinsing off the CHG Soap.  9.  Pat yourself dry with a clean towel.            10.  Wear clean pajamas.            11.  Place clean sheets on your bed the night of your first shower and do not sleep with pets.  Day of Surgery  Shower as above.  Do not wear lotions, powders, cologne or deodorant. Please wear clean clothes to the hospital. Remember to brush your teeth with toothpaste.  Do not wear jewelry.  Men may shave face and neck.  Do not bring valuables to the hospital.  [facility] is not responsible for any belongings or valuables.  Contacts, dentures or bridgework may not be worn into surgery.  Leave your suitcase in the car.  After surgery it may be brought to your  room.  For patients admitted to the hospital, discharge time will be determined by your treatment team.  Patients discharged the day of surgery will not be allowed to drive home.   [facility]Please read over the fact sheets that you were given.

## 2019-07-19 NOTE — Progress Notes (Addendum)
PCP - Bailey Mech, PA-C Cardiologist - Mariea Stable, MD Endocrinologist- Izell Carlisle, MD  PPM/ICD - Denies  Chest x-ray - N/A EKG - 08/09/18 (C.E); Tracing received Stress Test - 10/27/11 ECHO - 09/14/18 (C.E) Cardiac Cath - 06/22/15  Sleep Study - Yes CPAP - Yes  Patient is a Type 1 diabetic, on a Medtronic Insulin pump. Fasting Blood Sugar: 130-190 Checks Blood Sugar continuously with Freestyle Libre 2 Sensor. CBG at PAT appointment was 239. A1C obtained. Patient given instructions to decrease basal insulin rate by 20% at midnight the night before surgery. Diabetes Coordinator, Carollee Herter [phone]) notified, who instructed pt to contact his endocrinologist for specific instructions before surgery.  Blood Thinner Instructions: Hold Plavix 5 days prior to sx. Per patient, last dose 07/17/19. Aspirin Instructions: Continue   ERAS Protcol - Yes PRE-SURGERY Ensure or G2- G2 given  COVID TEST- 07/19/19. Patient has received both COVID vaccines. Vaccination record obtained.   Anesthesia review: Yes, cardiac hx; hx difficult intubation; A1C 8.5.  Patient denies shortness of breath, fever, cough and chest pain at PAT appointment   All instructions explained to the patient, with a verbal understanding of the material. Patient agrees to go over the instructions while at home for a better understanding. Patient also instructed to self quarantine after being tested for COVID-19. The opportunity to ask questions was provided.

## 2019-07-19 NOTE — Anesthesia Preprocedure Evaluation (Addendum)
Anesthesia Evaluation  Patient identified by MRN, date of birth, ID band Patient awake    Reviewed: Allergy & Precautions, NPO status , Patient's Chart, lab work & pertinent test results  History of Anesthesia Complications (+) DIFFICULT AIRWAY and history of anesthetic complications (Uneventful Glidescope #4 intubation)  Airway Mallampati: III  TM Distance: >3 FB Neck ROM: Full    Dental no notable dental hx.    Pulmonary sleep apnea and Continuous Positive Airway Pressure Ventilation , former smoker,    Pulmonary exam normal        Cardiovascular hypertension, Pt. on medications + CAD (on Plavix), + CABG (2017) and + Peripheral Vascular Disease  Normal cardiovascular exam  S/p AVR 2017  Echo 09/14/18: EF 55-60%, mild to moderate LVH, mild MR     Neuro/Psych CVA (2017) negative psych ROS   GI/Hepatic Neg liver ROS, GERD  Controlled,  Endo/Other  diabetes, Type 1, Insulin DependentHypothyroidism   Renal/GU negative Renal ROS  negative genitourinary   Musculoskeletal  (+) Arthritis ,   Abdominal   Peds  Hematology negative hematology ROS (+)   Anesthesia Other Findings Day of surgery medications reviewed with patient.  Reproductive/Obstetrics negative OB ROS                           Anesthesia Physical Anesthesia Plan  ASA: III  Anesthesia Plan: General   Post-op Pain Management:    Induction: Intravenous  PONV Risk Score and Plan: 3 and Treatment may vary due to age or medical condition, Ondansetron, Dexamethasone and Midazolam  Airway Management Planned: Oral ETT and Video Laryngoscope Planned  Additional Equipment: None  Intra-op Plan:   Post-operative Plan: Extubation in OR  Informed Consent: I have reviewed the patients History and Physical, chart, labs and discussed the procedure including the risks, benefits and alternatives for the proposed anesthesia with the patient  or authorized representative who has indicated his/her understanding and acceptance.     Dental advisory given  Plan Discussed with: CRNA  Anesthesia Plan Comments: (Will not block preop per Dr. Susa Simmonds. Patient consented for postop block if needed. Stephannie Peters, MD )      Anesthesia Quick Evaluation

## 2019-07-19 NOTE — Progress Notes (Signed)
Called in abnormal A1C of 8.5 to [employer] with Dr. Donnie Mesa office.

## 2019-07-19 NOTE — Progress Notes (Signed)
Anesthesia Chart Review:  Case: [id number] Date/Time: 07/23/19 1515   Procedure: OPEN REDUCTION RIGHT ANKLE DISLOCATION WITH REPAIR, OPEN TREATMENT OF TIBIAL PLAFOND FRACTURE WITH LATERAL MALLEOLUS, OPEN TREATMENT OF SYNDESMOSIS (Right Ankle) - LENGTH OF SURGERY: 2.5 HOURS   Anesthesia type: General   Pre-op diagnosis: RIGHT ANKLE DISLOCATION WITH DELTOID LIGAMENT TEAR, TIBIAL PLAFOND FRACTURE, DISTAL FIBULAR FRACTURE, SYNDESMOTIC DISRUPTION   [facility] ROOM 04 / [facility]   Surgeons: Terance Hart, MD      DISCUSSION: Patient is a 69 year old male scheduled for the above procedure.  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   DATE ANESTHESIA RECORD SUMMARY:   07/09/15 (AVR, [facility])  IV induction.  Mask ventilation without difficulty.  Oral airway inserted.  Glide scope and 4 used for anticipated difficult airway.  Grade 1 view.  Vocal cord appeared normal. 8.0 mm ET tube placed with 1 attempt.  07/22/02 Neck Exploration [facility])* IV induction. Fiberoptic intubation attempted by Dr. Orson Slick. Unable to visualize cords. Two handed jaw lift required for ventilation. Attempt X 3 to visualize cords, unsuccessful. Dr. Sharyl Nimrod attempted FOP x2. 7.0 ETT thru cords. Extremely difficult fiberoptic intubation, unsuccessful asleep. Awake ______ X 6 --> Dr. Sharyl Nimrod passed ETT on 4th attempt ______.  07/22/02 Thyroidectomy [facility])* IV  induction but difficulty maintaining mask seal (full beard). Oral and nasal airways placed. DL X 1 with cricoid pressure to help visualize cords. 7.5 oral ETT placed. Documented as difficult intubation and difficult mask.  10/02/06 Right Eye Vitrectomy [facility])* Easy oral intubation with direct visualization, 7.0 ETT. Easy mask.     * Records scanned under Media tab, Correspondence, 07/09/15  Cardiologist Dr. Jeanie Cooks wrote, "Okay to proceed, can hold [Plavix] for 5 days".  (See [facility] Care Everywhere). He reported last Plavix 07/17/19 and instructions to continue ASA.   Preoperative labs show H/H 12.3/37.2, Cr 1.12, K 5.4 (no mention of hemolysis), glucose 237, A1c 8.5%. He has a Medtronic insulin pump. Fasting glucose ~ 130-190. He had a Designer, industrial/product. PAT RN spoke with DM Coordinator.   07/19/2019 presurgical COVID-19 test is in process.  He reported COVID-19 vaccines x2.  Anesthesia team to evaluate on the day of surgery. Will enter order for ISTAT day of surgery to recheck potassium. Would defer to surgeon/anesthesiologist if T&S desired. His Plavix is on hold--H/H 12.3/37.2.   VS: BP 113/61   Pulse 76   Temp 36.4 C (Oral)   Resp 18   Ht 6\' 2"  (1.88 m)   Wt 102.1 kg   SpO2 100%   BMI 28.89 kg/m     PROVIDERS: Podraza, , PA-C is PCP [facility], see Care Everywhere) - [facility], MD is cardiologist [facility], see Care Everywhere). Last visit 02/13/19 with one year follow-up recommended. Previously he had seen 04/13/19, MD and Zoila Shutter, MD ([facility] cardiology) with [facility].  Nanetta Batty, MD is vascular surgeon [facility] [place], see Care Everywhere). Lat visit 11/23/18. Repeat ABIs and carotid 11/25/18 in  1 year recommended. Izell Hemet- Patel, Dhaval, MD is endocrinologist [facility] Endocrinologist [facility], see Care Everywhere). Last visit 02/20/19.    LABS: Preoperative labs noted. See DISCUSSION. A1c  8.5% was called to Dr. Donnie MesaAdair's office by PAT RN. Results include: Lab Results  Component Value Date   WBC 7.3 07/19/2019   HGB 12.3 (L) 07/19/2019   HCT 37.2 (L) 07/19/2019   PLT 324 07/19/2019   GLUCOSE 237 (H) 07/19/2019   NA 135 07/19/2019   K 5.4 (H) 07/19/2019   CL 101 07/19/2019   CREATININE 1.12 07/19/2019   BUN 19 07/19/2019   CO2 26 07/19/2019   HGBA1C 8.5 (H) 07/19/2019    PFTs 07/06/15: FVC 4.06 (77%), FEV1 2.85 (73%), DLCOunc 23.58 (62%).   IMAGES: CT Right ankle 07/17/19: IMPRESSION: 1. Subacute mildly displaced fractures of the distal tibial metaphysis and posterior malleolus. Widening of the ankle mortise compatible with unstable injury. 2. Impaction and fragmentation of the lateral tibial plafond with subchondral collapse posteriorly. 3. Large complex tibiotalar joint effusion. Effusion and bony irregularity of the posterolateral tibial plafond may be posttraumatic, however a superimposed infectious process would be difficult to exclude. If there is any clinical concern for infection/septic arthritis, arthrocentesis should be performed. 4. Tiny cortical avulsion fractures of the medial and lateral talus. 5. Marked fatty atrophy of the visualized lower leg and foot musculature, likely related to chronic neuropathic changes.   EKG: 08/08/18 ([facility]):  Sinus rhythm  Left anterior fascicular block  Intraventricular conduction delay  Nonspecific T wave abnormality  When compared with ECG of 16-Jul-2002 13:57,  Left anterior fascicular block now present  Confirmed by fellow Adrian ProwsAladin, Amer ([id number]) on 08/09/2018 11:49:23 AM    CV: Echo 09/14/18 [facility]): Summary  Technically difficult and limited study.  The left ventricular endocardial surface was not well visualized. Grossly  normal left ventricular function.  Ejection fraction is visually estimated at 55-60%  Mild to moderate left ventricular hypertrophy  Mild mitral regurgitation.  Normally functioning  prosthetic valve in aortic position with a mean gradient  through the valve of 12 mmHg. There is no significant aortic regurgitation.  Intact interatrial septum with no obvious shunt by color doppler.   Carotid US 09/13/18 [facility]): As outlined in 11/23/18 note by Dr. Cherly Hensenhang, "Carotid duplex with R 0-20% stenosis, L 21-39% stenosis. Vertebrals antegrade  Cardiac cath 06/22/15 (Pre-AVR):  Ost 2nd Mrg to 2nd Mrg lesion, 90% stenosed.  Mid Cx lesion, 85% stenosed.  Prox LAD lesion, 70% stenosed.  Mid LAD to Dist LAD lesion, 80% stenosed.  Ost RCA to Prox RCA lesion, 60% stenosed.  Mid RCA lesion, 80% stenosed.  Mid RCA to Dist RCA lesion, 90% stenosed.  Dist RCA lesion, 90% stenosed.  Cardiac event monitor 05/13/13-05/26/13: NSR.    Past Medical History:  Diagnosis Date  . Abnormal stress test 10/27/2011   lat isch--cardiac cath 10/28/2011  . Aortic stenosis, moderate Sept/Oct /2013   2017-> s/p bioprosthetic AV [facility](Edwards Magna Ease bovine pericardial valve model 3300 TFX, ser # [id number]).  . 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 Inferolateral perfusion defect + EKG abnormality during stress testing prompted Cath by [facility]: 3V CAD, EF normal.  Med mgmt recommended; b. 06/2015 Cath: 3VD and mod AS; c. 07/2015 CABG x 3 (LIMA->LAD, VG->OM2, VG->PDA) w/ AVR.  [name]. Carotid stenosis, right 09/2011   Dr. Allyson SabalBerry did R carotid stenting 01/2014.  Carotid dopplers 06/2015 essentially normal.  No change 07/2016--repeat 1 yr.  . Complication of anesthesia  difficulty with intubation,   . Decreased pedal pulses    LE doppler 11/08/11- no evidence of arterial insufficiency  . Diabetes mellitus Dx'd age 18   Novolog via insulin pump; sub-optimal control x years  . Diabetic neuropathy (HCC)    fine touch and position sense affected  . Diabetic retinopathy    Proliferative: Hx of retinal detachment on right-light perception only in right eye  . Diastolic dysfunction 2007; 2016   TEE with diastolic  dysfunction, EF 74%.  . Difficult intubation    ~ 2002 difficult fiberoptic intubation with takeback bleeding s/p thyroidectomy;  for thyroidectomy was intubated DL X 1 with cricoid pressure but difficult mask (full beard)  . Dizziness    feels off balance  . Erectile dysfunction    [place]  . GERD (gastroesophageal reflux disease)   . Headache    migraines  . Heart murmur   . History of tobacco abuse    Quit about 1990 (has 35 pack-yr hx)  . Hyperlipemia, mixed    a. intolerant of statins/zeta; per Dr. [name] 12/2016, pt started on PCSK9--incomplete responder?---01/2017.  [name] Hypothyroidism, postsurgical    Thyroidectomy 2002; multinodular goiter.  Dr. Elvera Lennox managing this as of 10/2015.  . Impaired vision    right eye light perception only; hx of retinal detachment.  . Impingement syndrome of right shoulder    08/26/15 Pt got subacromial steroid injection by orthopedist (Dr. Delfin Edis).  [facility] f/u 01/2016--MRI showed RC tear + AC joint arthritis, arthroscopic surgery done 03/2016 ([facility]).  . Neuromuscular disorder (HCC)    neuropathy  . OSA (obstructive sleep apnea)    06/2011 sleep study: moderate OSA, CPAP at 12 cm H2O.  . Osteoarthritis    Bilat thumb carpometacarpal joints.  Ortho injected each thumb x 2 in 2017.  Also injected 03/14/17.  [name] Recurrent pneumonia   . Shortness of breath dyspnea   . Stroke [facility])    TIA  . TIA (transient ischemic attack) 2015/16   with R carotid dz: pt got R carotid stent 01/2014 by Dr. [name] and was put on dual antiplatelet therapy with ASA 325mg  and plavix 75mg  qd afterwards  . TIA (transient ischemic attack) 05/2015   Left brain (right sided numbness + slurred speech)  Admitted for obs/workup 05/2015.  06/2015 Venous insufficiency   . Venous reflux 10/14/2011   venous doppler-R GSV continuous reflux throughout; too small for VNUS closure    Past Surgical History:  Procedure Laterality Date  . ABI  01/2016   Normal.   Dr. 12/14/2011 to repeat in 1 yr.  . AORTIC VALVE REPLACEMENT N/A 07/09/2015   Bovine pericardial valve.  Procedure: AORTIC VALVE REPLACEMENT (AVR);  Surgeon: [name], MD;  Location: [facility] OR;  Service: Open Heart Surgery;  Laterality: N/A;  . CARDIAC CATHETERIZATION  10/2011   EF normal.  Diffuse 3 vessel CAD, no stents placed.  Medical mgmt per [facility].  [facility] CARDIAC CATHETERIZATION N/A 06/22/2015   Procedure: Right/Left Heart Cath and Coronary Angiography;  Surgeon: [name], MD;  Location: [facility] INVASIVE CV LAB;  Service: Cardiovascular;  Laterality: N/A;  . carotid dopplers  06/30/15   Normal: repeat when clinically indicated per Dr. [facility]  . CAROTID STENT INSERTION Right 01/16/2014   Procedure: CAROTID STENT INSERTION;  Surgeon: [name], MD;  Location: [facility] CATH LAB;  Service: Cardiovascular;  Laterality: Right;  . CATARACT EXTRACTION     left  . CORONARY ARTERY BYPASS GRAFT  N/A 07/09/2015   Procedure: CORONARY ARTERY BYPASS GRAFTING (CABG)TIMES THREE USING LEFT INTERNAL MAMMARY ARTERY AND LEFT SAPHENOUS LEG VEIN HARVESTED ENDOSCOPICALLY;  Surgeon: Loreli Slot, MD;  Location: [facility] OR;  Service: Open Heart Surgery;  Laterality: N/A;  . EYE SURGERY     Multiple laser surgeries for diabetic retinopathy, also cataract surgery OU.  [name] INTRAOPERATIVE TRANSESOPHAGEAL ECHOCARDIOGRAM N/A 07/09/2015   Procedure: INTRAOPERATIVE TRANSESOPHAGEAL ECHOCARDIOGRAM;  Surgeon: Loreli Slot, MD;  Location: [facility] OR;  Service: Open Heart Surgery;  Laterality: N/A;  . lasix eye    . LEFT AND RIGHT HEART CATHETERIZATION WITH CORONARY ANGIOGRAM N/A 10/28/2011   Procedure: LEFT AND R0IHT HEART CATHETERIZATION WITH CORONARY ANGIOGRAM;  Surgeon: Lennette Bihari, MD;  Location[facility] CATH LAB;  Service: Cardiovascular;  Laterality: N/A;  . RETINAL DETACHMENT SURGERY     Right eye  . ROTATOR CUFF REPAIR W/ DISTAL CLAVICLE EXCISION Right 03/2016   Arthroscopic ([facility])  . THYROID SURGERY  2002   . TRANSTHORACIC ECHOCARDIOGRAM  10/2011;12/2012;01/2015; 08/2015; 07/28/16   Mod AS, mild LVH, EF 60-65%, no RWMA.  01/2015 EF 60-65%, grade I DD, progression of mod/sev AS.  08/2015 normal lv fxn with normal fxning bioprosthetic Ao valve.  07/2016--no change compared to 2017 echo.      MEDICATIONS: . aspirin EC 81 MG tablet  . clopidogrel (PLAVIX) 75 MG tablet  . Evolocumab with Infusor (REPATHA PUSHTRONEX SYSTEM) 420 MG/3.5ML SOCT  . FEVERFEW PO  . Insulin Human (INSULIN PUMP) SOLN  . insulin lispro (HUMALOG) 100 UNIT/ML injection  . levothyroxine (SYNTHROID) 175 MCG tablet  . losartan-hydrochlorothiazide (HYZAAR) 100-12.5 MG tablet  . Multiple Vitamin (MULTIVITAMIN WITH MINERALS) TABS tablet  . omeprazole (PRILOSEC OTC) 20 MG tablet   No current facility-administered medications for this encounter.    Shonna Chock, PA-C Surgical Short Stay/Anesthesiology [facility] Phone [phone] [facility] Phone [phone] 07/19/2019 5:52 PM

## 2019-07-20 LAB — SARS CORONAVIRUS 2 (TAT 6-24 HRS): SARS Coronavirus 2: NEGATIVE

## 2019-07-23 ENCOUNTER — Ambulatory Visit (HOSPITAL_COMMUNITY): Payer: Worker's Compensation | Admitting: Vascular Surgery

## 2019-07-23 ENCOUNTER — Other Ambulatory Visit: Payer: Self-pay

## 2019-07-23 ENCOUNTER — Ambulatory Visit (HOSPITAL_COMMUNITY): Payer: Worker's Compensation | Admitting: Physician Assistant

## 2019-07-23 ENCOUNTER — Ambulatory Visit (HOSPITAL_COMMUNITY): Payer: PRIVATE HEALTH INSURANCE | Attending: Orthopaedic Surgery

## 2019-07-23 ENCOUNTER — Encounter (HOSPITAL_COMMUNITY)
Admission: RE | Disposition: A | Discharge status: Home / Self Care | Payer: Self-pay | Source: Home / Self Care | Attending: Orthopaedic Surgery

## 2019-07-23 ENCOUNTER — Ambulatory Visit (HOSPITAL_COMMUNITY)
Admission: RE | Admit: 2019-07-23 | Discharge: 2019-07-23 | Disposition: A | Discharge status: Home / Self Care | Payer: Worker's Compensation | Attending: Orthopaedic Surgery | Admitting: Orthopaedic Surgery

## 2019-07-23 ENCOUNTER — Encounter (HOSPITAL_COMMUNITY): Payer: Self-pay | Admitting: Orthopaedic Surgery

## 2019-07-23 DIAGNOSIS — I350 Nonrheumatic aortic (valve) stenosis: Secondary | ICD-10-CM | POA: Insufficient documentation

## 2019-07-23 DIAGNOSIS — E782 Mixed hyperlipidemia: Secondary | ICD-10-CM | POA: Diagnosis not present

## 2019-07-23 DIAGNOSIS — G4733 Obstructive sleep apnea (adult) (pediatric): Secondary | ICD-10-CM | POA: Diagnosis not present

## 2019-07-23 DIAGNOSIS — G43909 Migraine, unspecified, not intractable, without status migrainosus: Secondary | ICD-10-CM | POA: Insufficient documentation

## 2019-07-23 DIAGNOSIS — Z7982 Long term (current) use of aspirin: Secondary | ICD-10-CM | POA: Insufficient documentation

## 2019-07-23 DIAGNOSIS — E1051 Type 1 diabetes mellitus with diabetic peripheral angiopathy without gangrene: Secondary | ICD-10-CM | POA: Insufficient documentation

## 2019-07-23 DIAGNOSIS — Y990 Civilian activity done for income or pay: Secondary | ICD-10-CM | POA: Diagnosis not present

## 2019-07-23 DIAGNOSIS — E1040 Type 1 diabetes mellitus with diabetic neuropathy, unspecified: Secondary | ICD-10-CM | POA: Diagnosis not present

## 2019-07-23 DIAGNOSIS — E103591 Type 1 diabetes mellitus with proliferative diabetic retinopathy without macular edema, right eye: Secondary | ICD-10-CM | POA: Insufficient documentation

## 2019-07-23 DIAGNOSIS — S82831A Other fracture of upper and lower end of right fibula, initial encounter for closed fracture: Secondary | ICD-10-CM | POA: Insufficient documentation

## 2019-07-23 DIAGNOSIS — I2510 Atherosclerotic heart disease of native coronary artery without angina pectoris: Secondary | ICD-10-CM | POA: Diagnosis not present

## 2019-07-23 DIAGNOSIS — E890 Postprocedural hypothyroidism: Secondary | ICD-10-CM | POA: Diagnosis not present

## 2019-07-23 DIAGNOSIS — Z8673 Personal history of transient ischemic attack (TIA), and cerebral infarction without residual deficits: Secondary | ICD-10-CM | POA: Insufficient documentation

## 2019-07-23 DIAGNOSIS — X58XXXA Exposure to other specified factors, initial encounter: Secondary | ICD-10-CM | POA: Diagnosis not present

## 2019-07-23 DIAGNOSIS — Z9641 Presence of insulin pump (external) (internal): Secondary | ICD-10-CM | POA: Diagnosis not present

## 2019-07-23 DIAGNOSIS — G4730 Sleep apnea, unspecified: Secondary | ICD-10-CM | POA: Insufficient documentation

## 2019-07-23 DIAGNOSIS — I6521 Occlusion and stenosis of right carotid artery: Secondary | ICD-10-CM | POA: Diagnosis not present

## 2019-07-23 DIAGNOSIS — K219 Gastro-esophageal reflux disease without esophagitis: Secondary | ICD-10-CM | POA: Diagnosis not present

## 2019-07-23 DIAGNOSIS — Z419 Encounter for procedure for purposes other than remedying health state, unspecified: Secondary | ICD-10-CM

## 2019-07-23 DIAGNOSIS — Z7902 Long term (current) use of antithrombotics/antiplatelets: Secondary | ICD-10-CM | POA: Insufficient documentation

## 2019-07-23 DIAGNOSIS — E1065 Type 1 diabetes mellitus with hyperglycemia: Secondary | ICD-10-CM | POA: Diagnosis not present

## 2019-07-23 DIAGNOSIS — S82871A Displaced pilon fracture of right tibia, initial encounter for closed fracture: Secondary | ICD-10-CM | POA: Diagnosis not present

## 2019-07-23 DIAGNOSIS — Z87891 Personal history of nicotine dependence: Secondary | ICD-10-CM | POA: Diagnosis not present

## 2019-07-23 DIAGNOSIS — Z79899 Other long term (current) drug therapy: Secondary | ICD-10-CM | POA: Insufficient documentation

## 2019-07-23 DIAGNOSIS — Z951 Presence of aortocoronary bypass graft: Secondary | ICD-10-CM | POA: Insufficient documentation

## 2019-07-23 DIAGNOSIS — Z79890 Hormone replacement therapy (postmenopausal): Secondary | ICD-10-CM | POA: Insufficient documentation

## 2019-07-23 DIAGNOSIS — Z794 Long term (current) use of insulin: Secondary | ICD-10-CM | POA: Insufficient documentation

## 2019-07-23 DIAGNOSIS — Z952 Presence of prosthetic heart valve: Secondary | ICD-10-CM | POA: Insufficient documentation

## 2019-07-23 HISTORY — PX: ORIF ANKLE FRACTURE: SHX5408

## 2019-07-23 LAB — GLUCOSE, CAPILLARY
Glucose-Capillary: 233 mg/dL — ABNORMAL HIGH (ref 70–99)
Glucose-Capillary: 217 mg/dL — ABNORMAL HIGH (ref 70–99)
Glucose-Capillary: 237 mg/dL — ABNORMAL HIGH (ref 70–99)

## 2019-07-23 LAB — POCT I-STAT, CHEM 8
BUN: 24 mg/dL — ABNORMAL HIGH (ref 8–23)
Calcium, Ion: 1.13 mmol/L — ABNORMAL LOW (ref 1.15–1.40)
Chloride: 99 mmol/L (ref 98–111)
Creatinine, Ser: 1.1 mg/dL (ref 0.61–1.24)
Glucose, Bld: 231 mg/dL — ABNORMAL HIGH (ref 70–99)
HCT: 35 % — ABNORMAL LOW (ref 39.0–52.0)
Hemoglobin: 11.9 g/dL — ABNORMAL LOW (ref 13.0–17.0)
Potassium: 4.7 mmol/L (ref 3.5–5.1)
Sodium: 137 mmol/L (ref 135–145)
TCO2: 26 mmol/L (ref 22–32)

## 2019-07-23 SURGERY — OPEN REDUCTION INTERNAL FIXATION (ORIF) ANKLE FRACTURE
Anesthesia: General | Site: Ankle | Laterality: Right

## 2019-07-23 MED ORDER — FENTANYL CITRATE (PF) 100 MCG/2ML IJ SOLN: 25.0000 ug | INTRAMUSCULAR | Status: DC | PRN

## 2019-07-23 MED ORDER — ACETAMINOPHEN 500 MG PO TABS
ORAL_TABLET | ORAL | Status: AC
  Administered 2019-07-23: 1000 mg via ORAL
  Filled 2019-07-23: qty 2

## 2019-07-23 MED ORDER — INSULIN ASPART 100 UNIT/ML ~~LOC~~ SOLN
SUBCUTANEOUS | Status: DC | PRN
  Administered 2019-07-23: 6 [IU] via SUBCUTANEOUS

## 2019-07-23 MED ORDER — CEFAZOLIN SODIUM-DEXTROSE 2-4 GM/100ML-% IV SOLN
INTRAVENOUS | Status: AC
  Filled 2019-07-23: qty 100

## 2019-07-23 MED ORDER — INSULIN ASPART 100 UNIT/ML ~~LOC~~ SOLN
SUBCUTANEOUS | Status: AC
  Filled 2019-07-23: qty 1

## 2019-07-23 MED ORDER — ORAL CARE MOUTH RINSE: 15.0000 mL | Freq: Once | OROMUCOSAL | Status: AC

## 2019-07-23 MED ORDER — FENTANYL CITRATE (PF) 250 MCG/5ML IJ SOLN
INTRAMUSCULAR | Status: DC | PRN
  Administered 2019-07-23 (×2): 50 ug via INTRAVENOUS

## 2019-07-23 MED ORDER — LACTATED RINGERS IV SOLN: INTRAVENOUS | Status: DC

## 2019-07-23 MED ORDER — PROPOFOL 10 MG/ML IV BOLUS
INTRAVENOUS | Status: DC | PRN
  Administered 2019-07-23: 150 mg via INTRAVENOUS
  Administered 2019-07-23: 50 mg via INTRAVENOUS

## 2019-07-23 MED ORDER — ROCURONIUM BROMIDE 10 MG/ML (PF) SYRINGE
PREFILLED_SYRINGE | INTRAVENOUS | Status: DC | PRN
  Administered 2019-07-23 (×2): 20 mg via INTRAVENOUS
  Administered 2019-07-23: 60 mg via INTRAVENOUS

## 2019-07-23 MED ORDER — CHLORHEXIDINE GLUCONATE 0.12 % MT SOLN: 15.0000 mL | Freq: Once | OROMUCOSAL | Status: AC

## 2019-07-23 MED ORDER — 0.9 % SODIUM CHLORIDE (POUR BTL) OPTIME
TOPICAL | Status: DC | PRN
  Administered 2019-07-23: 1000 mL

## 2019-07-23 MED ORDER — OXYCODONE HCL 5 MG PO TABS: 5.0000 mg | ORAL_TABLET | Freq: Once | ORAL | Status: DC | PRN

## 2019-07-23 MED ORDER — ONDANSETRON HCL 4 MG/2ML IJ SOLN
INTRAMUSCULAR | Status: AC
  Filled 2019-07-23: qty 2

## 2019-07-23 MED ORDER — MIDAZOLAM HCL 2 MG/2ML IJ SOLN
INTRAMUSCULAR | Status: AC
  Filled 2019-07-23: qty 2

## 2019-07-23 MED ORDER — VANCOMYCIN HCL 1000 MG IV SOLR
INTRAVENOUS | Status: DC | PRN
  Administered 2019-07-23: 1000 mg

## 2019-07-23 MED ORDER — SUCCINYLCHOLINE CHLORIDE 200 MG/10ML IV SOSY
PREFILLED_SYRINGE | INTRAVENOUS | Status: AC
  Filled 2019-07-23: qty 10

## 2019-07-23 MED ORDER — OXYCODONE HCL 5 MG/5ML PO SOLN: 5.0000 mg | Freq: Once | ORAL | Status: DC | PRN

## 2019-07-23 MED ORDER — MIDAZOLAM HCL 2 MG/2ML IJ SOLN
INTRAMUSCULAR | Status: DC | PRN
  Administered 2019-07-23: 1 mg via INTRAVENOUS

## 2019-07-23 MED ORDER — PHENYLEPHRINE HCL-NACL 10-0.9 MG/250ML-% IV SOLN
INTRAVENOUS | Status: DC | PRN
  Administered 2019-07-23: 30 ug/min via INTRAVENOUS

## 2019-07-23 MED ORDER — FENTANYL CITRATE (PF) 250 MCG/5ML IJ SOLN
INTRAMUSCULAR | Status: AC
  Filled 2019-07-23: qty 5

## 2019-07-23 MED ORDER — OXYCODONE HCL 5 MG PO TABS: 5.0000 mg | ORAL_TABLET | ORAL | 0 refills | Status: AC | PRN

## 2019-07-23 MED ORDER — LIDOCAINE 2% (20 MG/ML) 5 ML SYRINGE
INTRAMUSCULAR | Status: DC | PRN
  Administered 2019-07-23: 60 mg via INTRAVENOUS

## 2019-07-23 MED ORDER — ACETAMINOPHEN 500 MG PO TABS: 1000.0000 mg | ORAL_TABLET | Freq: Once | ORAL | Status: AC

## 2019-07-23 MED ORDER — FENTANYL CITRATE (PF) 100 MCG/2ML IJ SOLN
INTRAMUSCULAR | Status: AC
  Filled 2019-07-23: qty 2

## 2019-07-23 MED ORDER — ONDANSETRON HCL 4 MG/2ML IJ SOLN
INTRAMUSCULAR | Status: DC | PRN
  Administered 2019-07-23: 4 mg via INTRAVENOUS

## 2019-07-23 MED ORDER — CHLORHEXIDINE GLUCONATE 0.12 % MT SOLN
OROMUCOSAL | Status: AC
  Administered 2019-07-23: 15 mL via OROMUCOSAL
  Filled 2019-07-23: qty 15

## 2019-07-23 MED ORDER — EPHEDRINE SULFATE-NACL 50-0.9 MG/10ML-% IV SOSY
PREFILLED_SYRINGE | INTRAVENOUS | Status: DC | PRN
  Administered 2019-07-23 (×2): 10 mg via INTRAVENOUS

## 2019-07-23 MED ORDER — PHENYLEPHRINE 40 MCG/ML (10ML) SYRINGE FOR IV PUSH (FOR BLOOD PRESSURE SUPPORT)
PREFILLED_SYRINGE | INTRAVENOUS | Status: AC
  Filled 2019-07-23: qty 10

## 2019-07-23 MED ORDER — VANCOMYCIN HCL 1000 MG IV SOLR
INTRAVENOUS | Status: AC
  Filled 2019-07-23: qty 1000

## 2019-07-23 MED ORDER — EPHEDRINE 5 MG/ML INJ
INTRAVENOUS | Status: AC
  Filled 2019-07-23: qty 20

## 2019-07-23 MED ORDER — SUGAMMADEX SODIUM 200 MG/2ML IV SOLN
INTRAVENOUS | Status: DC | PRN
  Administered 2019-07-23: 200 mg via INTRAVENOUS

## 2019-07-23 MED ORDER — PROMETHAZINE HCL 25 MG/ML IJ SOLN: 6.2500 mg | INTRAMUSCULAR | Status: DC | PRN

## 2019-07-23 MED ORDER — PROPOFOL 10 MG/ML IV BOLUS
INTRAVENOUS | Status: AC
  Filled 2019-07-23: qty 20

## 2019-07-23 MED ORDER — CEFAZOLIN SODIUM-DEXTROSE 2-4 GM/100ML-% IV SOLN
2.0000 g | INTRAVENOUS | Status: AC
  Administered 2019-07-23: 2 g via INTRAVENOUS

## 2019-07-23 SURGICAL SUPPLY — 68 items
ALCOHOL 70% 16 OZ (MISCELLANEOUS) ×2 IMPLANT
BANDAGE ESMARK 6X9 LF (GAUZE/BANDAGES/DRESSINGS) IMPLANT
BIT DRILL CANN COMP 5.0 (BIT) ×2 IMPLANT
BIT DRILL CANN LNG FLUTE 3.0 (BIT) ×1 IMPLANT
BIT DRILL CANNULATED 3.0 (BIT) ×2
BIT DRILL SOLID LONG 5.5 (BIT) ×2 IMPLANT
BLADE SURG 15 STRL LF DISP TIS (BLADE) ×1 IMPLANT
BLADE SURG 15 STRL SS (BLADE) ×1
BNDG COHESIVE 4X5 TAN STRL (GAUZE/BANDAGES/DRESSINGS) IMPLANT
BNDG COHESIVE 6X5 TAN STRL LF (GAUZE/BANDAGES/DRESSINGS) IMPLANT
BNDG ELASTIC 6X10 VLCR STRL LF (GAUZE/BANDAGES/DRESSINGS) ×2 IMPLANT
BNDG ESMARK 6X9 LF (GAUZE/BANDAGES/DRESSINGS)
CANISTER SUCT 3000ML PPV (MISCELLANEOUS) ×2 IMPLANT
CHLORAPREP W/TINT 26 (MISCELLANEOUS) ×4 IMPLANT
COVER SURGICAL LIGHT HANDLE (MISCELLANEOUS) ×2 IMPLANT
COVER WAND RF STERILE (DRAPES) IMPLANT
CUFF TOURN SGL QUICK 34 (TOURNIQUET CUFF) ×1
CUFF TOURN SGL QUICK 42 (TOURNIQUET CUFF) IMPLANT
CUFF TRNQT CYL 34X4.125X (TOURNIQUET CUFF) ×1 IMPLANT
DRAPE C-ARMOR (DRAPES) ×2 IMPLANT
DRAPE OEC MINIVIEW 54X84 (DRAPES) IMPLANT
DRAPE U-SHAPE 47X51 STRL (DRAPES) ×2 IMPLANT
DRSG MEPITEL 4X7.2 (GAUZE/BANDAGES/DRESSINGS) IMPLANT
DRSG PAD ABDOMINAL 8X10 ST (GAUZE/BANDAGES/DRESSINGS) IMPLANT
DRSG XEROFORM 1X8 (GAUZE/BANDAGES/DRESSINGS) ×2 IMPLANT
ELECT REM PT RETURN 9FT ADLT (ELECTROSURGICAL) ×2
ELECTRODE REM PT RTRN 9FT ADLT (ELECTROSURGICAL) ×1 IMPLANT
GAUZE SPONGE 4X4 12PLY STRL (GAUZE/BANDAGES/DRESSINGS) ×4 IMPLANT
GAUZE SPONGE 4X4 12PLY STRL LF (GAUZE/BANDAGES/DRESSINGS) IMPLANT
GLOVE BIOGEL M STRL SZ7.5 (GLOVE) ×2 IMPLANT
GLOVE BIOGEL PI IND STRL 8 (GLOVE) ×1 IMPLANT
GLOVE BIOGEL PI INDICATOR 8 (GLOVE) ×1
GOWN STRL REUS W/ TWL LRG LVL3 (GOWN DISPOSABLE) ×1 IMPLANT
GOWN STRL REUS W/ TWL XL LVL3 (GOWN DISPOSABLE) ×1 IMPLANT
GOWN STRL REUS W/TWL LRG LVL3 (GOWN DISPOSABLE) ×1
GOWN STRL REUS W/TWL XL LVL3 (GOWN DISPOSABLE) ×1
GUIDEWIRE W/TRCR TIP 2.4X9.25 (WIRE) ×4 IMPLANT
KIT BASIN OR (CUSTOM PROCEDURE TRAY) ×2 IMPLANT
KIT TURNOVER KIT B (KITS) ×2 IMPLANT
NS IRRIG 1000ML POUR BTL (IV SOLUTION) ×2 IMPLANT
PACK ORTHO EXTREMITY (CUSTOM PROCEDURE TRAY) ×2 IMPLANT
PAD ARMBOARD 7.5X6 YLW CONV (MISCELLANEOUS) ×4 IMPLANT
PAD CAST 4YDX4 CTTN HI CHSV (CAST SUPPLIES) ×1 IMPLANT
PADDING CAST COTTON 4X4 STRL (CAST SUPPLIES) ×1
PIN FIXATION BB TAK THRD LG (PIN) ×2 IMPLANT
PLATE ANKLE FUSION TT RT (Plate) ×2 IMPLANT
PUTTY DBM ALLOSYNC PURE 10CC (Putty) ×2 IMPLANT
SCREW BONE LOCK LP 4.5X28 (Screw) ×2 IMPLANT
SCREW BONE LP TI 5.5X55 (Screw) ×2 IMPLANT
SCREW BONE TI LP 4.5X30 (Screw) ×8 IMPLANT
SCREW LOCK LP 4.5X30 (Screw) ×2 IMPLANT
SCREW LOCK LP 4.5X44 (Screw) ×4 IMPLANT
SCREW LOCK THRD 7X40X20 (Screw) ×2 IMPLANT
SCREW LOW PROFILE 4.5X28 (Screw) ×2 IMPLANT
SCREW LOW PROFILE 4.5X32 (Screw) ×2 IMPLANT
SPONGE LAP 18X18 RF (DISPOSABLE) ×2 IMPLANT
SUCTION FRAZIER HANDLE 10FR (MISCELLANEOUS) ×1
SUCTION TUBE FRAZIER 10FR DISP (MISCELLANEOUS) ×1 IMPLANT
SUT ETHILON 3 0 PS 1 (SUTURE) ×2 IMPLANT
SUT MON AB 3-0 SH 27 (SUTURE) ×2
SUT MON AB 3-0 SH27 (SUTURE) ×2 IMPLANT
SUT PDS AB 2-0 CT1 27 (SUTURE) ×4 IMPLANT
SUT VIC AB 2-0 CT1 27 (SUTURE) ×2
SUT VIC AB 2-0 CT1 TAPERPNT 27 (SUTURE) ×2 IMPLANT
SYR 5ML LL (SYRINGE) ×2 IMPLANT
TOWEL GREEN STERILE (TOWEL DISPOSABLE) ×2 IMPLANT
TOWEL GREEN STERILE FF (TOWEL DISPOSABLE) ×2 IMPLANT
TUBE CONNECTING 12X1/4 (SUCTIONS) ×2 IMPLANT

## 2019-07-23 NOTE — Discharge Instructions (Signed)
DR. Cheryle [facility] FOOT [facility] ANKLE SURGERY POST-OP INSTRUCTIONS   Pain Management 1. The numbing medicine and your leg will last around 18 hours, take a dose of your pain medicine as soon as you feel it wearing off to avoid rebound pain. 2. Keep your foot elevated above heart level.  Make sure that your heel hangs free ('floats'). 3. Take all prescribed medication as directed. 4. If taking narcotic pain medication you may want to use an over-the-counter stool softener to avoid constipation. 5. You may take over-the-counter NSAIDs (ibuprofen, naproxen, etc.) as well as over-the-counter acetaminophen as directed on the packaging as a supplement for your pain and may also use it to wean away from the prescription medication.  Activity ? Non-weightbearing ? Keep splint intact  First Postoperative Visit 1. Your first postop visit will be at least 2 weeks after surgery.  This should be scheduled when you schedule surgery. 2. If you do not have a postoperative visit scheduled please call [phone] to schedule an appointment. 3. At the appointment your incision will be evaluated for suture removal, x-rays will be obtained if necessary.  General Instructions 1. Swelling is very common after foot and ankle surgery.  It often takes 3 months for the foot and ankle to begin to feel comfortable.  Some amount of swelling will persist for 6-12 months. 2. DO NOT change the dressing.  If there is a problem with the dressing (too tight, loose, gets wet, etc.) please contact Dr. Ayleah Hofmeister's office. 3. DO NOT get the dressing wet.  For showers you can use an over-the-counter cast cover or wrap a washcloth around the top of your dressing and then cover it with a plastic bag and tape it to your leg. 4. DO NOT soak the incision (no tubs, pools, bath, etc.) until you have approval from Dr. Rozanne Heumann.  Contact Dr. Adairs office or go to Emergency Room if: 1. Temperature above 101 F. 2. Increasing pain that is unresponsive to pain  medication or elevation 3. Excessive redness or swelling in your foot 4. Dressing problems - excessive bloody drainage, looseness or tightness, or if dressing gets wet 5. Develop pain, swelling, warmth, or discoloration of your calf  

## 2019-07-23 NOTE — H&P (Signed)
PREOPERATIVE H&P  Chief Complaint: Right subacute ankle fracture dislocation  HPI: [name] is a 69 y.o. male who presents for preoperative history and physical with a diagnosis of right subacute fracture dislocation of the ankle.  Patient sustained a lateral malleolus fracture approximately 1.5 months ago.  This occurred at work.  He was seen at the urgent care where he was diagnosed with a lateral malleolus fracture he was given a walking boot.  He was instructed to weight-bear as tolerated in the boot.  He does have dense neuropathy and type 1 diabetes for 50 years.  Upon orthopedic evaluation by Dr. Turner Daniels at [facility] orthopedics it was noted that he had persistent subluxation and impaction of his lateral tibial plafond and severe medial clear space widening.  Attempted closed reduction was made was unsuccessful.  He was placed in a splint.  He was sent to me for evaluation.  We ordered a CT scan to evaluate the impaction.  There is severe impaction of the lateral and posterior lateral tibial plafond with subluxation and displacement of his lateral malleolus fracture.  There is also evidence of calcified scarring within the ankle joint and significant medial clear space widening.  On initial evaluation by Dr. Turner Daniels it was noted there was blistering over the medial malleolar skin due to persistent dislocation subluxation of the ankle.  There was no full-thickness skin necrosis noted.  Given this we had a lengthy discussion about treatment.  Currently he has been maintaining nonweightbearing for the past week and a half.. Symptoms are rated as moderate to severe, and have been worsening.  This is significantly impairing activities of daily living.  He has elected for surgical management.   Past Medical History:  Diagnosis Date  . Abnormal stress test 10/27/2011   lat isch--cardiac cath 10/28/2011  . Aortic stenosis, moderate Sept/Oct /2013   2017-> s/p bioprosthetic AV [facility] Ease bovine  pericardial valve model 3300 TFX, ser # [id number]).  . 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 Inferolateral perfusion defect + EKG abnormality during stress testing prompted Cath by [facility]: 3V CAD, EF normal.  Med mgmt recommended; b. 06/2015 Cath: 3VD and mod AS; c. 07/2015 CABG x 3 (LIMA->LAD, VG->OM2, VG->PDA) w/ AVR.  [name] Carotid stenosis, right 09/2011   Dr. [name] did R carotid stenting 01/2014.  Carotid dopplers 06/2015 essentially normal.  No change 07/2016--repeat 1 yr.  . Complication of anesthesia    difficulty with intubation,   . Decreased pedal pulses    LE doppler 11/08/11- no evidence of arterial insufficiency  . Diabetes mellitus Dx'd age 31   Novolog via insulin pump; sub-optimal control x years  . Diabetic neuropathy (HCC)    fine touch and position sense affected  . Diabetic retinopathy    Proliferative: Hx of retinal detachment on right-light perception only in right eye  . Diastolic dysfunction 2007; 2016   TEE with diastolic dysfunction, EF 74%.  . Difficult intubation    ~ 2002 difficult fiberoptic intubation with takeback bleeding s/p thyroidectomy;  for thyroidectomy was intubated DL X 1 with cricoid pressure but difficult mask (full beard)  . Dizziness    feels off balance  . Erectile dysfunction    [place] in [place]  . GERD (gastroesophageal reflux disease)   . Headache    migraines  . Heart murmur   . History of tobacco abuse    Quit about 1990 (has 35 pack-yr hx)  . Hyperlipemia, mixed    a. intolerant  of statins/zeta; per Dr. Allyson SabalBerry 12/2016, pt started on PCSK9--incomplete responder?---01/2017.  [name]. Hypothyroidism, postsurgical    Thyroidectomy 2002; multinodular goiter.  Dr. Elvera LennoxGherghe managing this as of 10/2015.  . Impaired vision    right eye light perception only; hx of retinal detachment.  . Impingement syndrome of right shoulder    08/26/15 Pt got subacromial steroid injection by orthopedist (Dr. Delfin EdisJohn Ritchie).  [facility] f/u 01/2016--MRI showed RC tear + AC  joint arthritis, arthroscopic surgery done 03/2016 ([facility]).  . Neuromuscular disorder (HCC)    neuropathy  . OSA (obstructive sleep apnea)    06/2011 sleep study: moderate OSA, CPAP at 12 cm H2O.  . Osteoarthritis    Bilat thumb carpometacarpal joints.  Ortho injected each thumb x 2 in 2017.  Also injected 03/14/17.  [name]. Recurrent pneumonia   . Shortness of breath dyspnea   . Stroke [facility](HCC)    TIA  . TIA (transient ischemic attack) 2015/16   with R carotid dz: pt got R carotid stent 01/2014 by Dr. Allyson SabalBerry and was put on dual antiplatelet therapy with ASA 325mg  and plavix 75mg  qd afterwards  . TIA (transient ischemic attack) 05/2015   Left brain (right sided numbness + slurred speech)  Admitted for obs/workup 05/2015.  [name]. Venous insufficiency   . Venous reflux 10/14/2011   venous doppler-R GSV continuous reflux throughout; too small for VNUS closure   Past Surgical History:  Procedure Laterality Date  . ABI  01/2016   Normal.  Dr. Allyson SabalBerry to repeat in 1 yr.  . AORTIC VALVE REPLACEMENT N/A 07/09/2015   Bovine pericardial valve.  Procedure: AORTIC VALVE REPLACEMENT (AVR);  Surgeon: Loreli SlotSteven C Hendrickson, MD;  [facility];  Service: Open Heart Surgery;  Laterality: N/A;  . CARDIAC CATHETERIZATION  10/2011   EF normal.  Diffuse 3 vessel CAD, no stents placed.  Medical mgmt per [facility].  [name]. CARDIAC CATHETERIZATION N/A 06/22/2015   Procedure: Right/Left Heart Cath and Coronary Angiography;  Surgeon: Runell GessJonathan J Berry, MD;  [facility];  Service: Cardiovascular;  Laterality: N/A;  . carotid dopplers  06/30/15   Normal: repeat when clinically indicated per Dr. Allyson SabalBerry  . CAROTID STENT INSERTION Right 01/16/2014   Procedure: CAROTID STENT INSERTION;  Surgeon: Runell GessJonathan J Berry, MD;  [facility];  Service: Cardiovascular;  Laterality: Right;  . CATARACT EXTRACTION     left  . CORONARY ARTERY BYPASS GRAFT N/A 07/09/2015   Procedure: CORONARY ARTERY BYPASS GRAFTING (CABG)TIMES THREE  USING LEFT INTERNAL MAMMARY ARTERY AND LEFT SAPHENOUS LEG VEIN HARVESTED ENDOSCOPICALLY;  Surgeon: Loreli SlotSteven C Hendrickson, MD;  [facility];  Service: Open Heart Surgery;  Laterality: N/A;  . EYE SURGERY     Multiple laser surgeries for diabetic retinopathy, also cataract surgery OU.  [name]. INTRAOPERATIVE TRANSESOPHAGEAL ECHOCARDIOGRAM N/A 07/09/2015   Procedure: INTRAOPERATIVE TRANSESOPHAGEAL ECHOCARDIOGRAM;  Surgeon: Loreli SlotSteven C Hendrickson, MD;  [facility];  Service: Open Heart Surgery;  Laterality: N/A;  . lasix eye    . LEFT AND RIGHT HEART CATHETERIZATION WITH CORONARY ANGIOGRAM N/A 10/28/2011   Procedure: LEFT AND R0IHT HEART CATHETERIZATION WITH CORONARY ANGIOGRAM;  Surgeon: Lennette Biharihomas A Kelly, MD;  [facility];  Service: Cardiovascular;  Laterality: N/A;  . RETINAL DETACHMENT SURGERY     Right eye  . ROTATOR CUFF REPAIR W/ DISTAL CLAVICLE EXCISION Right 03/2016   Arthroscopic ([facility])  . THYROID SURGERY  2002  . TRANSTHORACIC ECHOCARDIOGRAM  10/2011;12/2012;01/2015; 08/2015; 07/28/16   Mod AS, mild LVH,  EF 60-65%, no RWMA.  01/2015 EF 60-65%, grade I DD, progression of mod/sev AS.  08/2015 normal lv fxn with normal fxning bioprosthetic Ao valve.  07/2016--no change compared to 2017 echo.     Social History   Socioeconomic History  . Marital status: Married    Spouse name: Not on file  . Number of children: Not on file  . Years of education: Not on file  . Highest education level: Not on file  Occupational History  . Not on file  Tobacco Use  . Smoking status: Former Games developer  . Smokeless tobacco: Former Neurosurgeon    Quit date: 06/22/1987  Vaping Use  . Vaping Use: Never used  Substance and Sexual Activity  . Alcohol use: Yes    Alcohol/week: 0.0 standard drinks    Comment: occasional  . Drug use: No  . Sexual activity: Not on file  Other Topics Concern  . Not on file  Social History Narrative   Divorced x1, remarried.  Has 3 daughters from first marriage.   Works in  [employer] as a Psychologist, counselling (Educational psychologist) AND works part time as a Careers adviser at [employer].  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   Distant tobacco abuse (35 pack-yr hx), quit in the late 1990s, no ETOH or drugs.   No exercise.   Social Determinants of Health   Financial Resource Strain:   . Difficulty of Paying Living Expenses:   Food Insecurity:   . Worried About Programme researcher, broadcasting/film/video in the Last Year:   . Barista in the Last Year:   Transportation Needs:   . Freight forwarder (Medical):   [name] Lack of Transportation (Non-Medical):   Physical Activity:   . Days of Exercise per Week:   . Minutes of Exercise per Session:   Stress:   . Feeling of Stress :   Social Connections:   . Frequency of Communication with Friends and Family:   . Frequency of Social Gatherings with Friends and Family:   . Attends Religious Services:   . Active Member of Clubs or Organizations:   . Attends Banker Meetings:   [name] Marital Status:    Family History  Problem Relation Age of Onset  . Cancer Mother        lung cancer  . Alcohol abuse Father   . Cancer Father        laryngeal cancer  . Cancer Brother        oldest brother had lung cancer and melanoma   Allergies  Allergen Reactions  . Rosuvastatin Calcium Other (See Comments)    Problem with higher dosages (Lipitor caused memory issues), also caused leg pains and lethargy  . Statins Other (See Comments)    Problem with higher dosages (Lipitor caused memory issues), also caused leg pains and lethargy  . Tape Rash and Other (See Comments)    Adhesive tape.  (Paper tape OK)   Prior to Admission medications   Medication Sig Start Date End Date Taking? Authorizing Provider  aspirin EC 81 MG tablet Take 81 mg by mouth daily.   Yes [provider]  clopidogrel (PLAVIX) 75 MG tablet TAKE 1 TABLET BY MOUTH EVERY DAY Patient taking differently: Take 75 mg by mouth daily.  07/04/16  Yes Runell Gess, MD   Evolocumab with Infusor (REPATHA PUSHTRONEX SYSTEM) 420 MG/3.5ML SOCT Inject 1 Dose into the skin every 30 (thirty) days. Patient taking differently: Inject 420 mg into the  skin every 30 (thirty) days.  01/18/19  Yes Hilty, Lisette Abu, MD  FEVERFEW PO Take 1 capsule by mouth daily. To prevent migraines   Yes [provider]  Insulin Human (INSULIN PUMP) SOLN Inject into the skin continuous. Basal rate .95/hr, current pump settings: 12am .7, 2am 1, 7am 1.1, 11am .9, 5pm .9.25, 11pm .85. Uses Humalog U-100 Insulin.   Yes [provider]  insulin lispro (HUMALOG) 100 UNIT/ML injection Use 60 units via insulin pump. Patient taking differently: Use via insulin pump. 08/01/16  Yes Carlus Pavlov, MD  levothyroxine (SYNTHROID) 175 MCG tablet Take 175 mcg by mouth daily before breakfast. 06/26/19  Yes [provider]  losartan-hydrochlorothiazide (HYZAAR) 100-12.5 MG tablet Take 1 tablet by mouth daily. 06/26/19  Yes [provider]  Multiple Vitamin (MULTIVITAMIN WITH MINERALS) TABS tablet Take 1 tablet by mouth daily. Multivitamin for Men 50+   Yes [provider]  omeprazole (PRILOSEC OTC) 20 MG tablet Take 20 mg by mouth daily before breakfast.    Yes [provider]     Positive ROS: All other systems have been reviewed and were otherwise negative with the exception of those mentioned in the HPI and as above.  Physical Exam: General: Alert, no acute distress Cardiovascular: No pedal edema Respiratory: No cyanosis, no use of accessory musculature GI: No organomegaly, abdomen is soft and non-tender Skin: No lesions in the area of chief complaint Neurologic: Sensation intact distally Psychiatric: Patient is competent for consent with normal mood and affect Lymphatic: No axillary or cervical lymphadenopathy  MUSCULOSKELETAL: Right leg in a short leg splint.  Alignment is acceptable.  Patient has swelling to the foot and proximal to the splint.   Patient has evidence of toenail onychomycosis and blistering.  He is able to wiggle his toes.  Unable to feel light touch sensation about his toes.  Toes are well perfused.  Assessment: Right ankle fracture dislocation with impaction of the tibial plafond posteriorly and laterally. Type 1 diabetes, uncontrolled with dense peripheral neuropathy Peripheral arterial disease   Plan: Plan for right ankle open reduction with repair of dislocation.  We will also plan for open treatment of his tibial plafond fracture and fibular fracture.  We discussed primary fixation versus primary arthrodesis and given the amount of impaction of his ankle we will likely perform a primary arthrodesis.  We will also inspect the medial clear space and clean out this area as well as the other calcified scarring within the ankle joint.  I am hopeful that the skin on his medial ankle will heal.  We did discuss the possibility of further surgeries if he has full-thickness skin loss due to persistent subluxation.  We also discussed that his injury is a limb threatening injury given the early mismanagement with weightbearing in the boot despite an unstable ankle injury..  The risks benefits and alternatives were discussed with the patient including but not limited to the risks of nonoperative treatment, versus surgical intervention including infection, bleeding, nerve injury,  blood clots, cardiopulmonary complications, morbidity, mortality, among others, and they were willing to proceed.      Terance Hart, MD    07/23/2019 2:39 PM

## 2019-07-23 NOTE — Transfer of Care (Signed)
Immediate Anesthesia Transfer of Care Note  Patient: [name]  Procedure(s) Performed: OPEN REDUCTION RIGHT ANKLE DISLOCATION WITH REPAIR, OPEN TREATMENT OF TIBIAL PLAFOND FRACTURE WITH LATERAL MALLEOLUS, OPEN TREATMENT OF SYNDESMOSIS (Right Ankle)  Patient Location: PACU  Anesthesia Type:General  Level of Consciousness: awake, alert  and oriented  Airway & Oxygen Therapy: Patient Spontanous Breathing  Post-op Assessment: Report given to RN  Post vital signs: Reviewed and stable  Last Vitals:  Vitals Value Taken Time  BP 161/68 07/23/19 1907  Temp    Pulse 72 07/23/19 1908  Resp 13 07/23/19 1908  SpO2 99 % 07/23/19 1908  Vitals shown include unvalidated device data.  Last Pain:  Vitals:   07/23/19 1429  TempSrc:   PainSc: 0-No pain      Patients Stated Pain Goal: 4 (07/23/19 1429)  Complications: No complications documented.

## 2019-07-23 NOTE — Progress Notes (Signed)
Notified MD Susa Simmonds that the pharmacy that the RX was called into will be closed when the patient is discharged.  MD said he spoke to the patients wife and felt like they had adequate medications at home.

## 2019-07-23 NOTE — Anesthesia Postprocedure Evaluation (Signed)
Anesthesia Post Note  Patient: ORVAL DORTCH  Procedure(s) Performed: OPEN REDUCTION RIGHT ANKLE DISLOCATION WITH REPAIR, OPEN TREATMENT OF TIBIAL PLAFOND FRACTURE WITH LATERAL MALLEOLUS, OPEN TREATMENT OF SYNDESMOSIS (Right Ankle)     Patient location during evaluation: PACU Anesthesia Type: General Level of consciousness: awake and alert Pain management: pain level controlled Vital Signs Assessment: post-procedure vital signs reviewed and stable Respiratory status: spontaneous breathing, nonlabored ventilation and respiratory function stable Cardiovascular status: blood pressure returned to baseline and stable Postop Assessment: no apparent nausea or vomiting Anesthetic complications: no   No complications documented.  Last Vitals:  Vitals:   07/23/19 1915 07/23/19 1930  BP: (!) 146/60 (!) 141/68  Pulse: 78 78  Resp: (!) 9 17  Temp:  36.6 C  SpO2: 100% 96%    Last Pain:  Vitals:   07/23/19 1930  TempSrc:   PainSc: 0-No pain                 Beryle Lathe

## 2019-07-23 NOTE — Anesthesia Procedure Notes (Signed)
Procedure Name: Intubation Date/Time: 07/23/2019 4:06 PM Performed by: Laruth Bouchard., CRNA Pre-anesthesia Checklist: Patient identified, Emergency Drugs available, Suction available, Patient being monitored and Timeout performed Patient Re-evaluated:Patient Re-evaluated prior to induction Oxygen Delivery Method: Circle system utilized Preoxygenation: Pre-oxygenation with 100% oxygen Induction Type: IV induction Ventilation: Oral airway inserted - appropriate to patient size and Two handed mask ventilation required Laryngoscope Size: Glidescope and 4 Grade View: Grade I Tube type: Oral Tube size: 7.5 mm Number of attempts: 1 Airway Equipment and Method: Rigid stylet and Video-laryngoscopy Placement Confirmation: ETT inserted through vocal cords under direct vision,  positive ETCO2 and breath sounds checked- equal and bilateral Secured at: 22 cm Tube secured with: Tape Dental Injury: Teeth and Oropharynx as per pre-operative assessment  Difficulty Due To: Difficulty was anticipated

## 2019-07-24 ENCOUNTER — Encounter (HOSPITAL_COMMUNITY): Payer: Self-pay | Admitting: Orthopaedic Surgery

## 2019-07-24 LAB — GLUCOSE, CAPILLARY: Glucose-Capillary: 207 mg/dL — ABNORMAL HIGH (ref 70–99)

## 2019-08-09 NOTE — Op Note (Signed)
[name] male 69 y.o. 07/23/2019  PreOperative Diagnosis: Right tibiotalar joint fracture dislocation Right tibial plafond and fibula fracture   PostOperative Diagnosis: Same  PROCEDURE: Open reduction with repair of tibiotalar joint Open treatment of tibial plafond fracture without fibular fixation Tibiotalar joint arthrodesis  SURGEON: Dub Mikes, MD  ASSISTANT: Marylene Land, RNFA  ANESTHESIA: General with peripheral nerve block  FINDINGS: Persistent dislocated tibiotalar joint despite attempts at closed reduction.  Medial clear space interposed soft tissue due to persistent dislocation for several weeks.  Tibial plafond impaction fracture with significant amount of joint damage on the posterior lateral aspect of the tibial plafond.  Displaced fracture of the fibula. 2 cm in diameter skin ulceration on the medial malleolus  IMPLANTS: Arthrex ankle arthrodesis plate  WNUUVOZDGUY:69 y.o. male Sustained an ankle fracture approximately 3 weeks prior to presentation.  This was a work-related injury.  He was seen at an outside facility and diagnosed with a fibula fracture.  Patient has a history of type 1 diabetes and dense neuropathy.  Patient was walking on the ankle fracture in a walking boot and Due to the fracture pattern being unstable the ankle joint subluxated laterally.  The patient developed skin ulceration on the medial side of his ankle overlying the medial malleolus due to the persistent subluxation and skin tenting in this area prior to presentation in the clinic.  Upon presentation in the clinic the patient underwent attempted closed reduction by Dr. Turner Daniels which was unsuccessful.  Based on the persistent dislocation he developed impaction fracturing of the lateral tibial plafond and significant amount of joint damage seen on CT scan.  Patient was indicated for open reduction of his ankle joint and open treatment of his fractures with possible primary arthrodesis  based on intraoperative findings.   Patient understood the risks, benefits and alternatives to surgery which include but are not limited to wound healing complications, infection, nonunion, malunion, need for further surgery as well as damage to surrounding structures. They also understood the potential for continued pain in that there were no guarantees of acceptable outcome After weighing these risks the patient opted to proceed with surgery.  PROCEDURE: Patient was identified in the preoperative holding area.  The Right ankle was marked by myself.  Consent was signed by myself and the patient.  Block was performed by anesthesia in the preoperative holding area.  Patient was taken to the operative suite and placed supine on the operative table.  General LMA anesthesia was induced without difficulty. Bump was placed under the operative hip and bone foam was used.  All bony prominences were well padded.  Tourniquet was placed on the operative thigh.  Preoperative antibiotics were given. The extremity was prepped and draped in the usual sterile fashion and surgical timeout was performed.  The limb was elevated and the tourniquet was inflated to 250 mmHg.  Open reduction of tibiotalar joint dislocation: We began with attempting a closed reduction of the ankle joint.  This was unsuccessful due to the chronicity of the dislocation.  Therefore we made an anterior incision overlying the ankle joint for an anterior approach.  This was taken sharply down through skin and subcutaneous tissue.  The tibialis anterior tendon was identified and the sheath was incised in line with the tendon.  The tendon was retracted and the extensor hallucis longus tendon was identified.  This was retracted laterally.  Then the incision was carried down to bone and ankle joint capsule in line with the incision.  The ankle  joint capsule was incised overlying the ankle joint.  Subperiosteal and capsular flaps were created medially and  laterally about the ankle joint.  Upon entry into the ankle joint was noted that there is persistent dislocation laterally with fracturing of the fibula and the tibial plafond and.  The interposed scar tissue within the medial gutter was removed.  There is evidence of deltoid ligament tear. This tissue was repaired with a 2-0 Vicryl stitch. The ankle joint was fully mobilized and we are able to perform a reduction of the joint.  There was still persistent lateral subluxation so further debridement of tissue within the medial gutter was removed sharply with a 15 blade and Ronguer.After adequately removing the tissue the ankle joint was able to be reduced.  Open treatment of tibial plafond fracture without fibular fixation: Once the ankle joint was adequately reduced we inspected the fracture site of the tibial plafond.  There was a large area of impaction fracture of the lateral posterior component of the tibiotalar joint.  A Cobb elevator was placed into this area to inspect the fracture site and immobilize the fracture.  There is significant amount of cartilage damage in this area.  The impaction fracture material was attempted to be disimpacted through the wound and ankle joint however the bony material was fibrinous and devoid of structural integrity.  The cartilage surfaces were completely delaminated on the tibial plafond and therefore removed from the joint.  It was found that the underlying subchondral bone did not obtain the ability to support the ankle joint.  There was concern that with internal fixation the poor bone quality would not support the ankle and he would result in a valgus deformity ultimately.  Therefore we made the decision to proceed with ankle arthrodesis.  The fibula fracture was inspected and found to be reduced acceptably at this point.  No hardware was placed within the fibula fracture.  Ankle arthrodesis: The cartilage surfaces were denuded on the talar dome and remaining  cartilage on the tibial plafond.  Then using a 2.5 mm drill both surfaces were trephinated it.  After removal of all cartilage surfaces and inspection and the joint surface preps were found to be acceptable the tibiotalar joint was held in an acceptably reduced position and confirmed on fluoroscopy to be so.  Then K wire fixation was used to provisionally fix the ankle arthrodesis site.  Then a 7 mm partially threaded cannulated screw was placed across the tibiotalar joint for initial compressive fixation.  Then the arthrodesis site was further fixed using a Arthrex tibiotalar joint arthrodesis plate.  A combination of locking and nonlocking screws were used as well as a compression screw.  There was good fixation throughout the arthrodesis site and no motion with gentle manipulation after fixation.  Once the hardware was in place final fluoroscopic images were obtained to ensure appropriate plate placement and screw length as well as reduction of the ankle joint.  The wounds were then irrigated copiously with normal saline.  The capsular tissue was closed overlying the plate using a 2-0 Monocryl suture.  It was released and hemostasis was obtained.  The remaining tissue is closed in a layered fashion using 3-0 Monocryl and 3-0 nylon stitch.  A short leg nonweightbearing splint was placed.  The patient was awakened from anesthesia and taken to recovery in stable condition.  There were no complications.  POST OPERATIVE INSTRUCTIONS: Nonweightbearing to right lower extremity Keep splint dry and intact Follow-up in 2 weeks for wound  check, nonweightbearing x-rays of the right ankle and suture removal if appropriate.  We may end up leaving the sutures in for 1 month given his diabetes and neuropathy.  BLOOD LOSS:  Minimal         DRAINS: none         SPECIMEN: none       COMPLICATIONS:  * No complications entered in OR log *         Disposition: PACU - hemodynamically stable.         Condition:  stable

## 2019-09-22 ENCOUNTER — Other Ambulatory Visit: Payer: Self-pay | Admitting: Critical Care Medicine

## 2019-09-22 DIAGNOSIS — I63519 Cerebral infarction due to unspecified occlusion or stenosis of unspecified middle cerebral artery: Secondary | ICD-10-CM

## 2019-09-22 DIAGNOSIS — U071 COVID-19: Secondary | ICD-10-CM

## 2019-09-22 DIAGNOSIS — I10 Essential (primary) hypertension: Secondary | ICD-10-CM

## 2019-09-22 DIAGNOSIS — E1059 Type 1 diabetes mellitus with other circulatory complications: Secondary | ICD-10-CM

## 2019-09-22 DIAGNOSIS — E1065 Type 1 diabetes mellitus with hyperglycemia: Secondary | ICD-10-CM

## 2019-09-22 DIAGNOSIS — I739 Peripheral vascular disease, unspecified: Secondary | ICD-10-CM

## 2019-09-22 NOTE — Progress Notes (Signed)
I connected by phone with [name] on 09/22/2019 at 8:06 PM to discuss the potential use of a new treatment for mild to moderate COVID-19 viral infection in non-hospitalized patients.  This patient is a 69 y.o. male that meets the FDA criteria for Emergency Use Authorization of COVID monoclonal antibody casirivimab/imdevimab.  Has a (+) direct SARS-CoV-2 viral test result  Has mild or moderate COVID-19   Is NOT hospitalized due to COVID-19  Is within 10 days of symptom onset  Has at least one of the high risk factor(s) for progression to severe COVID-19 and/or hospitalization as defined in EUA.  Specific high risk criteria : Older age (>/= 71 yo), BMI > 25, Diabetes and Cardiovascular disease or hypertension   I have spoken and communicated the following to the patient or parent/caregiver regarding COVID monoclonal antibody treatment:  1. FDA has authorized the emergency use for the treatment of mild to moderate COVID-19 in adults and pediatric patients with positive results of direct SARS-CoV-2 viral testing who are 2 years of age and older weighing at least 40 kg, and who are at high risk for progressing to severe COVID-19 and/or hospitalization.  2. The significant known and potential risks and benefits of COVID monoclonal antibody, and the extent to which such potential risks and benefits are unknown.  3. Information on available alternative treatments and the risks and benefits of those alternatives, including clinical trials.  4. Patients treated with COVID monoclonal antibody should continue to self-isolate and use infection control measures (e.g., wear mask, isolate, social distance, avoid sharing personal items, clean and disinfect "high touch" surfaces, and frequent handwashing) according to CDC guidelines.   5. The patient or parent/caregiver has the option to accept or refuse COVID monoclonal antibody treatment.  After reviewing this information with the patient, The  patient agreed to proceed with receiving casirivimab\imdevimab infusion and will be provided a copy of the Fact sheet prior to receiving the infusion. [name] 09/22/2019 8:06 PM

## 2019-09-23 ENCOUNTER — Ambulatory Visit (HOSPITAL_COMMUNITY)
Admission: RE | Admit: 2019-09-23 | Discharge: 2019-09-23 | Disposition: A | Discharge status: Home / Self Care | Payer: PRIVATE HEALTH INSURANCE | Source: Ambulatory Visit | Attending: Pulmonary Disease | Admitting: Pulmonary Disease

## 2019-09-23 ENCOUNTER — Other Ambulatory Visit (HOSPITAL_COMMUNITY): Payer: Self-pay

## 2019-09-23 DIAGNOSIS — E1065 Type 1 diabetes mellitus with hyperglycemia: Secondary | ICD-10-CM | POA: Diagnosis present

## 2019-09-23 DIAGNOSIS — I739 Peripheral vascular disease, unspecified: Secondary | ICD-10-CM | POA: Insufficient documentation

## 2019-09-23 DIAGNOSIS — U071 COVID-19: Secondary | ICD-10-CM | POA: Diagnosis not present

## 2019-09-23 DIAGNOSIS — I10 Essential (primary) hypertension: Secondary | ICD-10-CM | POA: Insufficient documentation

## 2019-09-23 DIAGNOSIS — I63519 Cerebral infarction due to unspecified occlusion or stenosis of unspecified middle cerebral artery: Secondary | ICD-10-CM | POA: Diagnosis present

## 2019-09-23 DIAGNOSIS — E1059 Type 1 diabetes mellitus with other circulatory complications: Secondary | ICD-10-CM | POA: Insufficient documentation

## 2019-09-23 MED ORDER — FAMOTIDINE IN NACL 20-0.9 MG/50ML-% IV SOLN: 20.0000 mg | Freq: Once | INTRAVENOUS | Status: DC | PRN

## 2019-09-23 MED ORDER — ALBUTEROL SULFATE HFA 108 (90 BASE) MCG/ACT IN AERS: 2.0000 | INHALATION_SPRAY | Freq: Once | RESPIRATORY_TRACT | Status: DC | PRN

## 2019-09-23 MED ORDER — SODIUM CHLORIDE 0.9 % IV SOLN: INTRAVENOUS | Status: DC | PRN

## 2019-09-23 MED ORDER — DIPHENHYDRAMINE HCL 50 MG/ML IJ SOLN: 50.0000 mg | Freq: Once | INTRAMUSCULAR | Status: DC | PRN

## 2019-09-23 MED ORDER — METHYLPREDNISOLONE SODIUM SUCC 125 MG IJ SOLR: 125.0000 mg | Freq: Once | INTRAMUSCULAR | Status: DC | PRN

## 2019-09-23 MED ORDER — EPINEPHRINE 0.3 MG/0.3ML IJ SOAJ: 0.3000 mg | Freq: Once | INTRAMUSCULAR | Status: DC | PRN

## 2019-09-23 MED ORDER — SODIUM CHLORIDE 0.9 % IV SOLN
1200.0000 mg | Freq: Once | INTRAVENOUS | Status: AC
  Administered 2019-09-23: 1200 mg via INTRAVENOUS

## 2019-09-23 NOTE — Discharge Instructions (Signed)

## 2019-09-23 NOTE — Progress Notes (Signed)
  Diagnosis: COVID-19  Physician: Dr. Wright  Procedure: Covid Infusion Clinic Med: casirivimab\imdevimab infusion - Provided patient with casirivimab\imdevimab fact sheet for patients, parents and caregivers prior to infusion.  Complications: No immediate complications noted.  Discharge: Discharged home   Rydan Gulyas R Mckaylin Bastien 09/23/2019   

## 2019-09-25 ENCOUNTER — Other Ambulatory Visit: Payer: Self-pay

## 2019-09-25 MED ORDER — REPATHA PUSHTRONEX SYSTEM 420 MG/3.5ML ~~LOC~~ SOCT: 1.0000 | SUBCUTANEOUS | 12 refills | Status: DC

## 2019-09-25 NOTE — Addendum Note (Signed)
Addended by: Dorris Fetch on: 09/25/2019 04:51 PM   Modules accepted: Orders

## 2019-12-23 ENCOUNTER — Telehealth: Payer: Self-pay | Admitting: Internal Medicine

## 2019-12-23 NOTE — Telephone Encounter (Signed)
Request Reference Number: [id number]. REPATHA PUSH INJ 420/3.5 is approved through 12/22/2020

## 2019-12-23 NOTE — Telephone Encounter (Signed)
PA for repatha pushtronex submitted via CMM (Key: [id number])

## 2020-01-13 ENCOUNTER — Ambulatory Visit: Payer: PRIVATE HEALTH INSURANCE | Admitting: Internal Medicine

## 2020-03-04 ENCOUNTER — Ambulatory Visit (INDEPENDENT_AMBULATORY_CARE_PROVIDER_SITE_OTHER): Payer: HMO | Admitting: Cardiovascular Disease

## 2020-03-04 ENCOUNTER — Encounter: Payer: Self-pay | Admitting: Cardiovascular Disease

## 2020-03-04 ENCOUNTER — Other Ambulatory Visit: Payer: Self-pay

## 2020-03-04 VITALS — BP 132/60 | HR 85 | Ht 74.0 in | Wt 242.0 lb

## 2020-03-04 DIAGNOSIS — I2583 Coronary atherosclerosis due to lipid rich plaque: Secondary | ICD-10-CM

## 2020-03-04 DIAGNOSIS — I10 Essential (primary) hypertension: Secondary | ICD-10-CM

## 2020-03-04 DIAGNOSIS — G4733 Obstructive sleep apnea (adult) (pediatric): Secondary | ICD-10-CM

## 2020-03-04 DIAGNOSIS — E782 Mixed hyperlipidemia: Secondary | ICD-10-CM

## 2020-03-04 DIAGNOSIS — I6521 Occlusion and stenosis of right carotid artery: Secondary | ICD-10-CM

## 2020-03-04 DIAGNOSIS — I350 Nonrheumatic aortic (valve) stenosis: Secondary | ICD-10-CM

## 2020-03-04 DIAGNOSIS — I2510 Atherosclerotic heart disease of native coronary artery without angina pectoris: Secondary | ICD-10-CM

## 2020-03-04 NOTE — Assessment & Plan Note (Signed)
History of moderate aortic stenosis status post 23 mm Medtronic magna ease pericardial tissue valve AVR 07/09/2015.  His last 2D echo was performed 07/28/2016 revealing normal LV systolic function and a well-functioning aortic bioprosthesis.  We will recheck a 2D echocardiogram.

## 2020-03-04 NOTE — Assessment & Plan Note (Signed)
History of ostial right common carotid artery stenosis status post carotid artery stenting by myself 01/16/2014.  Had excellent angiographic clinical result.  His last carotid Doppler study performed 07/28/2016 revealed a patent right carotid stent.  We will repeat carotid Doppler studies.

## 2020-03-04 NOTE — Assessment & Plan Note (Signed)
History of CAD status post cardiac catheterization by myself 06/22/2015 revealing three-vessel disease.  He ultimately underwent CABG times 37/6/17 with a LIMA to his LAD, vein to obtuse marginal branch 2 and to the PDA.  He did well postoperatively.  He denies chest pain.

## 2020-03-04 NOTE — Assessment & Plan Note (Signed)
History of hyperlipidemia on Repatha, statin intolerant, with lipid profile performed by his PCP 02/05/2020 revealing cholesterol 180, LDL of 98 and HDL 60.  He is not at goal for secondary prevention.  He is followed by Dr. Rennis Golden in lipid clinic.  He may be a candidate for addition of Zetia and and or Bempiboic acid.

## 2020-03-04 NOTE — Assessment & Plan Note (Signed)
History of obstructive sleep apnea on CPAP. 

## 2020-03-04 NOTE — Progress Notes (Signed)
03/04/2020 [name]   [birth year]  [id number]  Primary Physician Podraza, Rudy Jew, PA-C Primary Cardiologist: Runell Gess MD FACP, [place], [place], [place]  HPI:  [name] is a 70 y.o.  mildly overweight married Caucasian male, father of 3 and grandfather to 3 grandchildren, I last saw him in the office 06/17/2016. He has a history of insulin-dependent diabetes on insulin pump, mild sleep apnea, and hyperlipidemia as well as mild aortic stenosis. He had a Myoview stress test that was abnormal which led to a heart catheterization performed by Dr. Tresa Endo in my absence revealing 70% disease in all 3 vessels with preserved LV function and mild AS. He also had moderate right ICA stenosis. He was neurologically asymptomatic. He was placed on beta-blocker and Ranexa Carotid Dopplers showed stable moderate right ICA stenosis. He was admitted to [facility] with 2 sequential TIAs. Carotid Dopplers did show moderate right ICA stenosis. The patient was seen by Dr. Pearlean Brownie, stroke neurologist, asked Dr. Imogene Burn, vascular surgeon for consultation. Dr. Imogene Burn ordered a CTA which showed high-grade ostial right internal carotid artery stenosis. I am asked to see the patient for consideration of carotid artery stenting. The patient was offered carotid endarterectomy. [name] apparently had a bad experience remotely being intubated at the time of the parathyroid operation and does not want to be reintubated again if at all possible making carotid stenting the best option for revascularization. I perform this on 01/16/14 successfully without complication. His follow-up Doppler 2 weeks later showed his carotid artery widely patent. Since I saw him 6 months ago he's been admitted with TIA type symptoms with negative workup. He also complains of chronic dizziness and increasing dyspnea on exertion. This recent 2-D echo showed increase in hisaortic stenosis now to moderately severewith a peak gradient  that has increased from 35 up to 52 mmHg. [name] underwent right and left heart cath by myself 06/22/15 revealing moderate aortic stenosis with three-vessel coronary artery disease. Essentially underwent elective aortic valve replacement with a Medtronic magna ease 23 mm pericardial tissue valve and bypass grafting X 3 on 07/09/15 with a LIMA to his LAD, vein to obtuse marginal branch #2 and to the PDA. He did well. Postoperatively. Follow-up 2-D echocardiogram performed 08/19/15 revealed normal LV function with a well functioning aortic bioprosthesis. He feels clinically improved. He has been completely asymptomatic since his CABG/AVR. He did have right rotator cuff surgery in March 2018.  Since I saw him 4 years ago he continues to do well.  He has not been back because of insurance issues.  His last 2D echo and carotid Doppler study were 4 years ago as well.  He has gained 20 pound since I last saw him.  He broke his ankle at work and had surgery last July and has been out of work since that time.  He is planning on retiring.  He is healed from his surgery.  He denies chest pain or shortness of breath.   Current Meds  Medication Sig  . aspirin EC 81 MG tablet Take 81 mg by mouth daily.  [name] b complex vitamins capsule Take 1 capsule by mouth daily.  . calcium-vitamin D (OSCAL WITH D) 500-200 MG-UNIT TABS tablet Take by mouth.  . clopidogrel (PLAVIX) 75 MG tablet TAKE 1 TABLET BY MOUTH EVERY DAY (Patient taking differently: Take 75 mg by mouth daily.)  . Continuous Blood Gluc Sensor (FREESTYLE LIBRE 2 SENSOR) MISC Inject into the skin.  [name]  Evolocumab with Infusor (REPATHA PUSHTRONEX SYSTEM) 420 MG/3.5ML SOCT Inject 1 Dose into the skin every 30 (thirty) days.  [name] FEVERFEW PO Take 1 capsule by mouth daily. To prevent migraines  . Insulin Human (INSULIN PUMP) SOLN Inject into the skin continuous. Basal rate .95/hr, current pump settings: 12am .7, 2am 1, 7am 1.1, 11am .9, 5pm .9.25, 11pm .85. Uses Humalog  U-100 Insulin.  [name] insulin lispro (HUMALOG) 100 UNIT/ML injection Use 60 units via insulin pump. (Patient taking differently: Use via insulin pump.)  . levothyroxine (SYNTHROID) 175 MCG tablet Take 175 mcg by mouth daily before breakfast.  . losartan-hydrochlorothiazide (HYZAAR) 100-12.5 MG tablet Take 1 tablet by mouth daily.  . Multiple Vitamin (MULTIVITAMIN WITH MINERALS) TABS tablet Take 1 tablet by mouth daily. Multivitamin for Men 50+  . omeprazole (PRILOSEC OTC) 20 MG tablet Take 20 mg by mouth daily before breakfast.      Allergies  Allergen Reactions  . Rosuvastatin Calcium Other (See Comments)    Problem with higher dosages (Lipitor caused memory issues), also caused leg pains and lethargy  . Statins Other (See Comments)    Problem with higher dosages (Lipitor caused memory issues), also caused leg pains and lethargy  . Tape Rash and Other (See Comments)    Adhesive tape.  (Paper tape OK)    Social History   Socioeconomic History  . Marital status: Married    Spouse name: Not on file  . Number of children: Not on file  . Years of education: Not on file  . Highest education level: Not on file  Occupational History  . Not on file  Tobacco Use  . Smoking status: Former Games developer  . Smokeless tobacco: Former Neurosurgeon    Quit date: 06/22/1987  Vaping Use  . Vaping Use: Never used  Substance and Sexual Activity  . Alcohol use: Yes    Alcohol/week: 0.0 standard drinks    Comment: occasional  . Drug use: No  . Sexual activity: Not on file  Other Topics Concern  . Not on file  Social History Narrative   Divorced x1, remarried.  Has 3 daughters from first marriage.   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   Distant tobacco abuse (35 pack-yr hx), quit in the late 1990s, no ETOH or drugs.   No exercise.   Social Determinants of Health   Financial Resource Strain: Not  on file  Food Insecurity: Not on file  Transportation Needs: Not on file  Physical Activity: Not on file  Stress: Not on file  Social Connections: Not on file  Intimate Partner Violence: Not on file     Review of Systems: General: negative for chills, fever, night sweats or weight changes.  Cardiovascular: negative for chest pain, dyspnea on exertion, edema, orthopnea, palpitations, paroxysmal nocturnal dyspnea or shortness of breath Dermatological: negative for rash Respiratory: negative for cough or wheezing Urologic: negative for hematuria Abdominal: negative for nausea, vomiting, diarrhea, bright red blood per rectum, melena, or hematemesis Neurologic: negative for visual changes, syncope, or dizziness All other systems reviewed and are otherwise negative except as noted above.    Blood pressure 132/60, pulse 85, height 6\' 2"  (1.88 m), weight 242 lb (109.8 kg), SpO2 95 %.  General appearance: alert and no distress Neck: no adenopathy, no JVD, supple, symmetrical, trachea midline, thyroid not enlarged, symmetric, no tenderness/mass/nodules and Bilateral carotid bruits versus transmitted  murmur Lungs: clear to auscultation bilaterally Heart: 2/6 outflow tract murmur consistent with aortic stenosis Extremities: extremities normal, atraumatic, no cyanosis or edema Pulses: 2+ and symmetric Skin: Skin color, texture, turgor normal. No rashes or lesions Neurologic: Alert and oriented X 3, normal strength and tone. Normal symmetric reflexes. Normal coordination and gait  EKG sinus rhythm at 85 without ST or T wave changes.  I personally reviewed this EKG.  ASSESSMENT AND PLAN:   Aortic stenosis, moderate History of moderate aortic stenosis status post 23 mm Medtronic magna ease pericardial tissue valve AVR 07/09/2015.  His last 2D echo was performed 07/28/2016 revealing normal LV systolic function and a well-functioning aortic bioprosthesis.  We will recheck a 2D  echocardiogram.  Essential hypertension History of essential hypertension a blood pressure measured today at 132/60.  He is on losartan, and hydrochlorothiazide.  Hyperlipidemia History of hyperlipidemia on Repatha, statin intolerant, with lipid profile performed by his PCP 02/05/2020 revealing cholesterol 180, LDL of 98 and HDL 60.  He is not at goal for secondary prevention.  He is followed by Dr. Rennis Golden in lipid clinic.  He may be a candidate for addition of Zetia and and or Bempiboic acid.  Carotid stenosis, right History of ostial right common carotid artery stenosis status post carotid artery stenting by myself 01/16/2014.  Had excellent angiographic clinical result.  His last carotid Doppler study performed 07/28/2016 revealed a patent right carotid stent.  We will repeat carotid Doppler studies.  CAD (coronary artery disease) History of CAD status post cardiac catheterization by myself 06/22/2015 revealing three-vessel disease.  He ultimately underwent CABG times 37/6/17 with a LIMA to his LAD, vein to obtuse marginal branch 2 and to the PDA.  He did well postoperatively.  He denies chest pain.  OSA (obstructive sleep apnea) History of obstructive sleep apnea on CPAP      Runell Gess MD [facility], [facility] 03/04/2020 9:38 AM

## 2020-03-04 NOTE — Assessment & Plan Note (Signed)
History of essential hypertension a blood pressure measured today at 132/60.  He is on losartan, and hydrochlorothiazide.

## 2020-03-04 NOTE — Patient Instructions (Signed)
Medication Instructions:  Your physician recommends that you continue on your current medications as directed. Please refer to the Current Medication list given to you today.  *If you need a refill on your cardiac medications before your next appointment, please call your pharmacy*   Testing/Procedures: Your physician has requested that you have a carotid duplex. This test is an ultrasound of the carotid arteries in your neck. It looks at blood flow through these arteries that supply the brain with blood. Allow one hour for this exam. There are no restrictions or special instructions. This procedure is done at [address].  Your physician has requested that you have an echocardiogram. Echocardiography is a painless test that uses sound waves to create images of your heart. It provides your doctor with information about the size and shape of your heart and how well your heart's chambers and valves are working. This procedure takes approximately one hour. There are no restrictions for this procedure. This procedure is done at [address].   Follow-Up: At [facility], you and your health needs are our priority.  As part of our continuing mission to provide you with exceptional heart care, we have created designated Provider Care Teams.  These Care Teams include your primary Cardiologist (physician) and Advanced Practice Providers (APPs -  Physician Assistants and Nurse Practitioners) who all work together to provide you with the care you need, when you need it.  We recommend signing up for the patient portal called "MyChart".  Sign up information is provided on this After Visit Summary.  MyChart is used to connect with patients for Virtual Visits (Telemedicine).  Patients are able to view lab/test results, encounter notes, upcoming appointments, etc.  Non-urgent messages can be sent to your provider as well.   To learn more about what you can do with MyChart, go to  [email or web address].    Your next appointment:   12 month(s)  The format for your next appointment:   In Person  Provider:   Jonathan Berry, MD  

## 2020-03-05 ENCOUNTER — Telehealth: Payer: Self-pay | Admitting: Internal Medicine

## 2020-03-05 DIAGNOSIS — E782 Mixed hyperlipidemia: Secondary | ICD-10-CM

## 2020-03-05 NOTE — Telephone Encounter (Signed)
Lipid panel ordered Lab reminder sent to patient via MyChart

## 2020-03-13 LAB — LIPID PANEL
Chol/HDL Ratio: 2.6 ratio (ref 0.0–5.0)
Cholesterol, Total: 143 mg/dL (ref 100–199)
HDL: 54 mg/dL (ref >39)
LDL Chol Calc (NIH): 70 mg/dL (ref 0–99)
Triglycerides: 104 mg/dL (ref 0–149)
VLDL Cholesterol Cal: 19 mg/dL (ref 5–40)

## 2020-03-16 ENCOUNTER — Other Ambulatory Visit: Payer: Self-pay

## 2020-03-16 ENCOUNTER — Encounter: Payer: Self-pay | Admitting: Internal Medicine

## 2020-03-16 ENCOUNTER — Ambulatory Visit (INDEPENDENT_AMBULATORY_CARE_PROVIDER_SITE_OTHER): Payer: HMO | Admitting: Internal Medicine

## 2020-03-16 VITALS — BP 150/74 | HR 86 | Ht 74.0 in | Wt 246.8 lb

## 2020-03-16 DIAGNOSIS — I739 Peripheral vascular disease, unspecified: Secondary | ICD-10-CM | POA: Diagnosis not present

## 2020-03-16 DIAGNOSIS — Z951 Presence of aortocoronary bypass graft: Secondary | ICD-10-CM

## 2020-03-16 DIAGNOSIS — I2583 Coronary atherosclerosis due to lipid rich plaque: Secondary | ICD-10-CM

## 2020-03-16 DIAGNOSIS — I2510 Atherosclerotic heart disease of native coronary artery without angina pectoris: Secondary | ICD-10-CM

## 2020-03-16 DIAGNOSIS — E785 Hyperlipidemia, unspecified: Secondary | ICD-10-CM | POA: Diagnosis not present

## 2020-03-16 MED ORDER — ROSUVASTATIN CALCIUM 5 MG PO TABS: 5.0000 mg | ORAL_TABLET | Freq: Every day | ORAL | 3 refills | Status: DC

## 2020-03-16 NOTE — Progress Notes (Signed)
OFFICE NOTE  Chief Complaint:  Lipid clinic follow-up  Primary Carter Physician: Bailey Mech, PA-C  HPI:  [name] is a 70 y.o. 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  He also underwent bovine pericardial aortic valve replacement at that time.  In addition he has a history of dyslipidemia with statin intolerance.  He reports taking high-dose atorvastatin and rosuvastatin which caused both myalgias, lethargy and memory problems.  In addition he said he took ezetimibe in the past which she thought also cause some memory problems.  He was referred for evaluation of PCSK9 inhibitor.  Actually he was recently started on Repatha and has had 5 doses of the medication.  Lipid profile 7 months ago indicated total cholesterol 245, triglycerides 113, HDL 58 and LDL 164.  His most recent lipid profile 2 weeks ago showed a total cholesterol 202, triglycerides 70, HDL 75 and LDL 113.  There is concerned about incomplete response to Repatha, but it should be noted that his calculated LDL was lower, but is misrepresented as his HDL fraction has gone up from 58-75.  That being said his actual LDL is probably higher than represented.  He does report compliance with the medication but he says the injector pen does cause him discomfort.  He also uses an insulin pump and is fairly used to needles.  02/07/2018  [name] is seen today in follow-up.  He is done well with the Repatha Pushtronex system.  He is now using that once monthly.  He does get some occasional injection site redness with use but typically that improves after 2 days.  His cholesterol profile is also favorably responded to the medication.  His total cholesterol now as of January 12, 2018 is 176, HDL 72, LDL 94 and triglycerides 52.  He denies any other myalgias or other side effects that he had with statins.  He is  actually quite close to goal LDL less than 70.  Interestingly, he has not had a robust response as expected with the Repatha, namely a 60 to 65% reduction although does seem to have a little more benefit on the high once monthly dose.  He previously had been on the every 2 weekly Repatha dose with suboptimal response, suggesting that there may be PCSK9 or LDL mutation or perhaps a large fraction of his LDL is LP(a).  01/29/2019  [name] returns today for lipid clinic follow-up.  He was previously being seen by Dr. Gery Pray however switched his Carter to Dr. Onalee Hua at [facility] due to a change in his insurance.  Currently he has [employer].  Despite this, he wishes to continue to follow here for lipid management.  Recently he had lipids drawn at [facility], those were in November 2020.  Total cholesterol was 150, triglycerides 58, HDL 68 and a direct LDL was 81.  He remains on the Repatha Pushtronex system.  At some point he notes that he was started on Vascepa.  He was advised to take 2 g twice daily which is the recommended dose however he has been taking only 1 g daily.  After extensive review of his previous lipids however I do not see any evidence that he has had elevated triglycerides over 150, which means use of Vascepa is not based on any guideline directed indications or supported by current literature.  In fact  his low triglycerides are not predictive of further increase cardiovascular risk.  03/16/2020  [name] returns today for follow-up.  He is back to see Dr. [name] due to his insurance switch.  He seems to be doing well on the Repatha Pushtronex system.  We will need to reauthorize that.  Hopefully we can get it to him at low to no cost.  We had previously discussed that he was not quite to target LDL.  His direct LDL was 81 however recent labs showed total cholesterol 182, triglycerides 80, HDL 61 and LDL 98.  Would like to see his LDL cholesterol below 100.  We discussed  possible additional therapies and I would prefer adding low-dose statin since most of the data for PCSK9 inhibitors were on top of statin therapy.  He had previously not been able to tolerate statins due to issues with memory and leg pains as well as lethargy, however this was at much higher doses  PMHx:  Past Medical History:  Diagnosis Date  . Abnormal stress test 10/27/2011   lat isch--cardiac cath 10/28/2011  . Aortic stenosis, moderate Sept/Oct /2013   2017-> s/p bioprosthetic AV [facility] Ease bovine pericardial valve model 3300 TFX, ser # [id number]).  . 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 Inferolateral perfusion defect + EKG abnormality during stress testing prompted Cath by [facility]: 3V CAD, EF normal.  Med mgmt recommended; b. 06/2015 Cath: 3VD and mod AS; c. 07/2015 CABG x 3 (LIMA->LAD, VG->OM2, VG->PDA) w/ AVR.  [name] Carotid stenosis, right 09/2011   Dr. [name] did R carotid stenting 01/2014.  Carotid dopplers 06/2015 essentially normal.  No change 07/2016--repeat 1 yr.  . Complication of anesthesia    difficulty with intubation,   . Decreased pedal pulses    LE doppler 11/08/11- no evidence of arterial insufficiency  . Diabetes mellitus Dx'd age 48   Novolog via insulin pump; sub-optimal control x years  . Diabetic neuropathy (HCC)    fine touch and position sense affected  . Diabetic retinopathy    Proliferative: Hx of retinal detachment on right-light perception only in right eye  . Diastolic dysfunction 2007; 2016   TEE with diastolic dysfunction, EF 74%.  . Difficult intubation    ~ 2002 difficult fiberoptic intubation with takeback bleeding s/p thyroidectomy;  for thyroidectomy was intubated DL X 1 with cricoid pressure but difficult mask (full beard)  . Dizziness    feels off balance  . Erectile dysfunction    Se[place]rologist in [place]  . GERD (gastroesophageal reflux disease)   . Headache    migraines  . Heart murmur   . History of tobacco abuse    Quit about 1990 (has 35  pack-yr hx)  . Hyperlipemia, mixed    a. intolerant of statins/zeta; per Dr. [name] 12/2016, pt started on PCSK9--incomplete responder?---01/2017.  [name] Hypothyroidism, postsurgical    Thyroidectomy 2002; multinodular goiter.  Dr. Elvera Lennox managing this as of 10/2015.  . Impaired vision    right eye light perception only; hx of retinal detachment.  . Impingement syndrome of right shoulder    08/26/15 Pt got subacromial steroid injection by orthopedist (Dr. Delfin Edis).  [facility] f/u 01/2016--MRI showed RC tear + AC joint arthritis, arthroscopic surgery done 03/2016 ([facility]).  . Neuromuscular disorder (HCC)    neuropathy  . OSA (obstructive sleep apnea)    06/2011 sleep study: moderate OSA, CPAP at 12 cm H2O.  . Osteoarthritis    Bilat thumb carpometacarpal joints.  Ortho  injected each thumb x 2 in 2017.  Also injected 03/14/17.  [name] Recurrent pneumonia   . Shortness of breath dyspnea   . Stroke [facility])    TIA  . TIA (transient ischemic attack) 2015/16   with R carotid dz: pt got R carotid stent 01/2014 by Dr. [name] and was put on dual antiplatelet therapy with ASA 325mg  and plavix 75mg  qd afterwards  . TIA (transient ischemic attack) 05/2015   Left brain (right sided numbness + slurred speech)  Admitted for obs/workup 05/2015.  06/2015 Venous insufficiency   . Venous reflux 10/14/2011   venous doppler-R GSV continuous reflux throughout; too small for VNUS closure    Past Surgical History:  Procedure Laterality Date  . ABI  01/2016   Normal.  Dr. 12/14/2011 to repeat in 1 yr.  . AORTIC VALVE REPLACEMENT N/A 07/09/2015   Bovine pericardial valve.  Procedure: AORTIC VALVE REPLACEMENT (AVR);  Surgeon: [name], MD;  Location: [facility] OR;  Service: Open Heart Surgery;  Laterality: N/A;  . CARDIAC CATHETERIZATION  10/2011   EF normal.  Diffuse 3 vessel CAD, no stents placed.  Medical mgmt per [facility].  [facility] CARDIAC CATHETERIZATION N/A 06/22/2015   Procedure: Right/Left Heart Cath and Coronary  Angiography;  Surgeon: [name], MD;  Location: [facility] INVASIVE CV LAB;  Service: Cardiovascular;  Laterality: N/A;  . carotid dopplers  06/30/15   Normal: repeat when clinically indicated per Dr. [facility]  . CAROTID STENT INSERTION Right 01/16/2014   Procedure: CAROTID STENT INSERTION;  Surgeon: [name], MD;  Location: [facility] CATH LAB;  Service: Cardiovascular;  Laterality: Right;  . CATARACT EXTRACTION     left  . CORONARY ARTERY BYPASS GRAFT N/A 07/09/2015   Procedure: CORONARY ARTERY BYPASS GRAFTING (CABG)TIMES THREE USING LEFT INTERNAL MAMMARY ARTERY AND LEFT SAPHENOUS LEG VEIN HARVESTED ENDOSCOPICALLY;  Surgeon: [facility], MD;  Location: [facility] OR;  Service: Open Heart Surgery;  Laterality: N/A;  . EYE SURGERY     Multiple laser surgeries for diabetic retinopathy, also cataract surgery OU.  Loreli Slot INTRAOPERATIVE TRANSESOPHAGEAL ECHOCARDIOGRAM N/A 07/09/2015   Procedure: INTRAOPERATIVE TRANSESOPHAGEAL ECHOCARDIOGRAM;  Surgeon: [name], MD;  Location: [facility] OR;  Service: Open Heart Surgery;  Laterality: N/A;  . lasix eye    . LEFT AND RIGHT HEART CATHETERIZATION WITH CORONARY ANGIOGRAM N/A 10/28/2011   Procedure: LEFT AND R0IHT HEART CATHETERIZATION WITH CORONARY ANGIOGRAM;  Surgeon: [facility], MD;  Location: [facility] CATH LAB;  Service: Cardiovascular;  Laterality: N/A;  . ORIF ANKLE FRACTURE Right 07/23/2019   Procedure: OPEN REDUCTION RIGHT ANKLE DISLOCATION WITH REPAIR, OPEN TREATMENT OF TIBIAL PLAFOND FRACTURE WITH LATERAL MALLEOLUS, OPEN TREATMENT OF SYNDESMOSIS;  Surgeon: [facility], MD;  Location: [facility] OR;  Service: Orthopedics;  Laterality: Right;  LENGTH OF SURGERY: 2.5 HOURS  . RETINAL DETACHMENT SURGERY     Right eye  . ROTATOR CUFF REPAIR W/ DISTAL CLAVICLE EXCISION Right 03/2016   Arthroscopic ([facility])  . THYROID SURGERY  2002  . TRANSTHORACIC ECHOCARDIOGRAM  10/2011;12/2012;01/2015; 08/2015; 07/28/16   Mod AS, mild LVH, EF 60-65%, no RWMA.  01/2015 EF  60-65%, grade I DD, progression of mod/sev AS.  08/2015 normal lv fxn with normal fxning bioprosthetic Ao valve.  07/2016--no change compared to 2017 echo.      FAMHx:  Family History  Problem Relation Age of Onset  . Cancer Mother        lung cancer  . Alcohol abuse Father   . Cancer Father  laryngeal cancer  . Cancer Brother        oldest brother had lung cancer and melanoma    SOCHx:   reports that he has quit smoking. He quit smokeless tobacco use about 32 years ago. He reports current alcohol use. He reports that he does not use drugs.  ALLERGIES:  Allergies  Allergen Reactions  . Rosuvastatin Calcium Other (See Comments)    Problem with higher dosages (Lipitor caused memory issues), also caused leg pains and lethargy  . Statins Other (See Comments)    Problem with higher dosages (Lipitor caused memory issues), also caused leg pains and lethargy  . Tape Rash and Other (See Comments)    Adhesive tape.  (Paper tape OK)    ROS: Pertinent items noted in HPI and remainder of comprehensive ROS otherwise negative.  HOME MEDS: Current Outpatient Medications on File Prior to Visit  Medication Sig Dispense Refill  . aspirin EC 81 MG tablet Take 81 mg by mouth daily.    [name]. b complex vitamins capsule Take 1 capsule by mouth daily.    . calcium-vitamin D (OSCAL WITH D) 500-200 MG-UNIT TABS tablet Take by mouth.    . clopidogrel (PLAVIX) 75 MG tablet TAKE 1 TABLET BY MOUTH EVERY DAY (Patient taking differently: Take 75 mg by mouth daily.) 90 tablet 3  . Evolocumab with Infusor (REPATHA PUSHTRONEX SYSTEM) 420 MG/3.5ML SOCT Inject 1 Dose into the skin every 30 (thirty) days. 3.5 mL 12  . FEVERFEW PO Take 1 capsule by mouth daily. To prevent migraines    . Insulin Human (INSULIN PUMP) SOLN Inject into the skin continuous. Basal rate .95/hr, current pump settings: 12am .7, 2am 1, 7am 1.1, 11am .9, 5pm .9.25, 11pm .85. Uses Humalog U-100 Insulin.    [name]. insulin lispro (HUMALOG) 100  UNIT/ML injection Use 60 units via insulin pump. (Patient taking differently: Use via insulin pump.) 90 mL 2  . levothyroxine (SYNTHROID) 175 MCG tablet Take 175 mcg by mouth daily before breakfast.    . losartan-hydrochlorothiazide (HYZAAR) 100-12.5 MG tablet Take 1 tablet by mouth daily.    . Multiple Vitamin (MULTIVITAMIN WITH MINERALS) TABS tablet Take 1 tablet by mouth daily. Multivitamin for Men 50+    . omeprazole (PRILOSEC OTC) 20 MG tablet Take 20 mg by mouth daily before breakfast.      No current facility-administered medications on file prior to visit.    LABS/IMAGING: No results found for this or any previous visit (from the past 48 hour(s)). No results found.  LIPID PANEL:    Component Value Date/Time   CHOL 143 03/13/2020 0809   TRIG 104 03/13/2020 0809   HDL 54 03/13/2020 0809   CHOLHDL 2.6 03/13/2020 0809   CHOLHDL 4.0 05/07/2015 0557   VLDL 15 05/07/2015 0557   LDLCALC 70 03/13/2020 0809   LDLDIRECT 159.5 04/21/2011 0843     WEIGHTS: Wt Readings from Last 3 Encounters:  03/16/20 246 lb 12.8 oz (111.9 kg)  03/04/20 242 lb (109.8 kg)  07/23/19 225 lb (102.1 kg)    VITALS: BP (!) 150/74   Pulse 86   Ht 6\' 2"  (1.88 m)   Wt 246 lb 12.8 oz (111.9 kg)   SpO2 96%   BMI 31.69 kg/m   EXAM: General appearance: alert and no distress Lungs: clear to auscultation bilaterally Heart: regular rate and rhythm, S1, S2 normal and systolic murmur: early systolic 2/6, crescendo at 2nd right intercostal space Extremities: extremities normal, atraumatic, no cyanosis or edema Skin: Skin  color, texture, turgor normal. No rashes or lesions Neurologic: Grossly normal Psych: Pleasant  EKG: Deferred  ASSESSMENT: 1. Mixed dyslipidemia, goal LDL less than 70 2. ASCVD status post 3 vessel CABG and bioprosthetic AVR (2017) 3. Statin intolerance - myalgias 4. PAD 5. IDDM  PLAN: 1.   [name] continues to tolerate the Repatha Pushtronex system.  His lipids are much  better however still not quite to target.  I think he would benefit from adding low-dose rosuvastatin 5 mg daily.  He did have some side effects with rosuvastatin however at higher doses.  I do think you tolerate the low-dose just fine.  We will plan repeat lipids in about 3 months and he should reach target.  Follow-up annually or sooner as necessary.  Chrystie Nose, MD, [facility], FACP  [place]  [facility]  Medical Director of the Advanced Lipid Disorders &  Cardiovascular Risk Reduction Clinic Diplomate of the American Board of Clinical Lipidology Attending Cardiologist  Direct Dial: [phone]  Fax: [phone]  Website:  [email or web address] [email or web address] 03/16/2020, 10:15 AM

## 2020-03-16 NOTE — Patient Instructions (Signed)
Medication Instructions:   -Start taking rosuvastatin (crestor) 5mg  once daily.  *If you need a refill on your cardiac medications before your next appointment, please call your pharmacy*   Lab Work: Your physician recommends that you return for lab work in: 3 month for lipid profile.  If you have labs (blood work) drawn today and your tests are completely normal, you will receive your results only by: MyChart Message (if you have MyChart) OR . A paper copy in the mail If you have any lab test that is abnormal or we need to change your treatment, we will call you to review the results.   Follow-Up: At [facility], you and your health needs are our priority.  As part of our continuing mission to provide you with exceptional heart care, we have created designated Provider Care Teams.  These Care Teams include your primary Cardiologist (physician) and Advanced Practice Providers (APPs -  Physician Assistants and Nurse Practitioners) who all work together to provide you with the care you need, when you need it.  We recommend signing up for the patient portal called "MyChart".  Sign up information is provided on this After Visit Summary.  MyChart is used to connect with patients for Virtual Visits (Telemedicine).  Patients are able to view lab/test results, encounter notes, upcoming appointments, etc.  Non-urgent messages can be sent to your provider as well.   To learn more about what you can do with MyChart, go to [facility].    Your next appointment:   12 month(s)  The format for your next appointment:   In Person  Provider:   [email or web address], MD   Other Instructions In the lipid clinic.

## 2020-03-17 NOTE — Telephone Encounter (Signed)
repatha patient assistance application faxed to [employer] @ [phone] 

## 2020-03-19 ENCOUNTER — Ambulatory Visit (HOSPITAL_COMMUNITY)
Admission: RE | Admit: 2020-03-19 | Discharge: 2020-03-19 | Disposition: A | Discharge status: Home / Self Care | Payer: HMO | Source: Ambulatory Visit | Attending: Internal Medicine | Admitting: Internal Medicine

## 2020-03-19 ENCOUNTER — Other Ambulatory Visit (HOSPITAL_COMMUNITY): Payer: Self-pay | Admitting: Cardiovascular Disease

## 2020-03-19 ENCOUNTER — Other Ambulatory Visit: Payer: Self-pay

## 2020-03-19 DIAGNOSIS — I6521 Occlusion and stenosis of right carotid artery: Secondary | ICD-10-CM | POA: Diagnosis not present

## 2020-03-19 DIAGNOSIS — Z95828 Presence of other vascular implants and grafts: Secondary | ICD-10-CM

## 2020-03-26 ENCOUNTER — Other Ambulatory Visit: Payer: Self-pay

## 2020-03-26 ENCOUNTER — Ambulatory Visit (HOSPITAL_COMMUNITY): Payer: HMO | Attending: Cardiovascular Disease

## 2020-03-26 DIAGNOSIS — E782 Mixed hyperlipidemia: Secondary | ICD-10-CM | POA: Insufficient documentation

## 2020-03-26 DIAGNOSIS — I350 Nonrheumatic aortic (valve) stenosis: Secondary | ICD-10-CM | POA: Diagnosis not present

## 2020-03-26 DIAGNOSIS — I10 Essential (primary) hypertension: Secondary | ICD-10-CM | POA: Insufficient documentation

## 2020-03-26 LAB — ECHOCARDIOGRAM COMPLETE
AR max vel: 1.24 cm2
AV Area VTI: 1.35 cm2
AV Area mean vel: 1.36 cm2
AV Mean grad: 15 mmHg
AV Peak grad: 22.5 mmHg
Ao pk vel: 2.37 m/s
Area-P 1/2: 3.53 cm2
S' Lateral: 3.5 cm

## 2020-04-13 ENCOUNTER — Telehealth: Payer: Self-pay | Admitting: Internal Medicine

## 2020-04-13 NOTE — Telephone Encounter (Signed)
PA Case: [id number], Status: Approved, Coverage Starts on: 04/13/2020 12:00:00 AM, Coverage Ends on: 04/13/2021 12:00:00 AM.

## 2020-04-13 NOTE — Telephone Encounter (Signed)
PA for repatha pushtronex generated via CMM Submitted: (Key: [id number])

## 2020-05-25 ENCOUNTER — Telehealth: Payer: Self-pay | Admitting: *Deleted

## 2020-05-25 ENCOUNTER — Other Ambulatory Visit: Payer: Self-pay | Admitting: Orthopaedic Surgery

## 2020-05-25 NOTE — Telephone Encounter (Signed)
   [facility] Pre-operative Risk Assessment    Patient Name: [name]  DOB: 24-Sep-1950  MRN: [id number]   HEARTCARE STAFF: - Please ensure there is not already an duplicate clearance open for this procedure. - Under Visit Info/Reason for Call, type in Other and utilize the format Clearance MM/DD/YY or Clearance TBD. Do not use dashes or single digits. - If request is for dental extraction, please clarify the # of teeth to be extracted.  Request for surgical clearance:  1. What type of surgery is being performed? Intramedullary nail of the right tibia, deep hardware removal   2. When is this surgery scheduled? 05/28/20   3. What type of clearance is required (medical clearance vs. Pharmacy clearance to hold med vs. Both)? both  4. Are there any medications that need to be held prior to surgery and how long?   5. Practice name and name of physician performing surgery? [facility]   6. What is the office phone number? [phone]   7.   What is the office fax number? [phone]  8.   Anesthesia type (None, local, MAC, general) ? general   Fredia Beets 05/25/2020, 2:54 PM  _________________________________________________________________   (provider comments below)

## 2020-05-25 NOTE — Telephone Encounter (Signed)
   Name: [name]  DOB: March 03, 1950  MRN: [id number]   Primary Cardiologist: Nanetta Batty, MD  Chart reviewed as part of pre-operative protocol coverage.   70 y.o. male with . Hx of CVA . S/p R carotid stent in 2016 . Coronary artery disease  . Aortic stenosis . S/p CABG, bioprosthetic AVR in 2017 . Echocardiogram 3/22: normal EF, mild to mod AS (repeat in 03/2021) . Carotid US 3/22: patent R ICA stent . Hypertension  . Hyperlipidemia  . Insulin dependent diabetes mellitus   Last OV:  03/04/20 with Dr. [name] Procedure:  IM nail R tibia, hardware removal  Rx:  Guidance on Plavix  RCRI:  Perioperative Risk of Major Cardiac Event is (%): 11 (high risk) DASI:  Functional Capacity in METs is: 4.64 (functional status is fair )  Patient was contacted 05/25/2020 in reference to pre-operative risk assessment for pending surgery as outlined below.    Since last seen, [name] has done well without chest pain, shortness of breath.  His L ankle is fused and he fractured his tibia on the R when he pivoted to turn outside of his car a few days ago.  Surgeon wants him to stop Plavix starting tomorrow (5/24).    Recommendations: . Based on ACC/AHA guidelines, the patient is at acceptable risk for the planned procedure and may proceed without further cardiovascular testing.  . Continue ASA without interruption. . Pt can hold Clopidogrel (Plavix) as requested.  This should be resumed post op as soon as it is safe.    Please call with questions. Tereso Newcomer, PA-C 05/25/2020, 3:27 PM

## 2020-05-25 NOTE — Telephone Encounter (Signed)
Notes faxed to surgeon. This phone note will be removed from the preop pool. Tereso Newcomer, PA-C  05/25/2020 4:26 PM

## 2020-05-26 NOTE — Pre-Procedure Instructions (Signed)
Surgical Instructions    Your procedure is scheduled on Thursday, May 26th.  Report to [facility] Main Entrance "A" at 11:45 A.M., then check in with the Admitting office.  Call this number if you have problems the morning of surgery:  [phone]   If you have any questions prior to your surgery date call [phone]: Open Monday-Friday 8am-4pm    Remember:  Do not eat after midnight the night before your surgery  You may drink clear liquids until 10:45 the morning of your surgery.   Clear liquids allowed are: Water, Non-Citrus Juices (without pulp), Carbonated Beverages, Clear Tea, Black Coffee Only, and Gatorade.  Please complete your 10 oz bottle of water that was provided to you by 10:45 a.m. the morning of surgery.     Take these medicines the morning of surgery:  cetirizine (ZYRTEC) levothyroxine (SYNTHROID) omeprazole (PRILOSEC OTC) rosuvastatin (CRESTOR)   Take these medications AS NEEDED Albuterol Inhaler-please bring with you on the day of surgery dimenhyDRINATE (DRAMAMINE) loperamide (IMODIUM A-D)  Per your cardiologist, STOP clopidogrel (PLAVIX) as of May 24th. Continue Aspirin as normal.  As of today, STOP taking any Aleve, Naproxen, Ibuprofen, Motrin, Advil, Goody's, BC's, all herbal medications, fish oil, and all vitamins.  Patients with Insulin Pumps    . For patients with Insulin Pumps: o Contact your diabetes doctor for specific instructions before surgery. o Decrease basal insulin rates by 20% at midnight the night before surgery. ? Do not remove your insulin pump prior to arrival to short stay the morning of surgery.  Anesthesia will instruct you on when to remove your pump. o Note that if your surgery is planned to be longer than 2 hours, your insulin pump will be removed and intravenous (IV) insulin will be started and managed by the nurses and anesthesiologist. You will be able to restart your insulin pump once you are awake and able to manage  it. o Make sure to bring insulin pump supplies to the hospital with you in case your site needs to be changed.           Do NOT Smoke (Tobacco/Vaping) or drink Alcohol 24 hours prior to your procedure.  If you use a CPAP at night, you may bring all equipment for your overnight stay.   Contacts, glasses, piercing's, hearing aid's, dentures or partials may not be worn into surgery, please bring cases for these belongings.    For patients admitted to the hospital, discharge time will be determined by your treatment team.   Patients discharged the day of surgery will not be allowed to drive home, and someone needs to stay with them for 24 hours.    Special instructions:   [facility]- Preparing For Surgery  Before surgery, you can play an important role. Because skin is not sterile, your skin needs to be as free of germs as possible. You can reduce the number of germs on your skin by washing with CHG (chlorahexidine gluconate) Soap before surgery.  CHG is an antiseptic cleaner which kills germs and bonds with the skin to continue killing germs even after washing.    Oral Hygiene is also important to reduce your risk of infection.  Remember - BRUSH YOUR TEETH THE MORNING OF SURGERY WITH YOUR REGULAR TOOTHPASTE  Please do not use if you have an allergy to CHG or antibacterial soaps. If your skin becomes reddened/irritated stop using the CHG.  Do not shave (including legs and underarms) for at least 48 hours prior to first  CHG shower. It is OK to shave your face.  Please follow these instructions carefully.   1. Shower the NIGHT BEFORE SURGERY and the MORNING OF SURGERY  2. If you chose to wash your hair, wash your hair first as usual with your normal shampoo.  3. After you shampoo, rinse your hair and body thoroughly to remove the shampoo.  4. Use CHG Soap as you would any other liquid soap. You can apply CHG directly to the skin and wash gently with a scrungie or a clean washcloth.    5. Apply the CHG Soap to your body ONLY FROM THE NECK DOWN.  Do not use on open wounds or open sores. Avoid contact with your eyes, ears, mouth and genitals (private parts). Wash Face and genitals (private parts)  with your normal soap.   6. Wash thoroughly, paying special attention to the area where your surgery will be performed.  7. Thoroughly rinse your body with warm water from the neck down.  8. DO NOT shower/wash with your normal soap after using and rinsing off the CHG Soap.  9. Pat yourself dry with a CLEAN TOWEL.  10. Wear CLEAN PAJAMAS to bed the night before surgery  11. Place CLEAN SHEETS on your bed the night before your surgery  12. DO NOT SLEEP WITH PETS.   Day of Surgery: Shower with CHG soap. Do not wear jewelry Do not wear lotions, powders, colognes, or deodorant. Men may shave face and neck. Do not bring valuables to the hospital. [facility] is not responsible for any belongings or valuables. Wear Clean/Comfortable clothing the morning of surgery Remember to brush your teeth WITH YOUR REGULAR TOOTHPASTE.   Please read over the following fact sheets that you were given.

## 2020-05-27 ENCOUNTER — Other Ambulatory Visit: Payer: Self-pay

## 2020-05-27 ENCOUNTER — Encounter (HOSPITAL_COMMUNITY)
Admission: RE | Admit: 2020-05-27 | Discharge: 2020-05-27 | Disposition: A | Discharge status: Home / Self Care | Payer: HMO | Source: Ambulatory Visit | Attending: Orthopaedic Surgery | Admitting: Orthopaedic Surgery

## 2020-05-27 ENCOUNTER — Encounter (HOSPITAL_COMMUNITY): Payer: Self-pay

## 2020-05-27 DIAGNOSIS — Z20822 Contact with and (suspected) exposure to covid-19: Secondary | ICD-10-CM | POA: Insufficient documentation

## 2020-05-27 DIAGNOSIS — Z01812 Encounter for preprocedural laboratory examination: Secondary | ICD-10-CM | POA: Insufficient documentation

## 2020-05-27 LAB — BASIC METABOLIC PANEL
Anion gap: 6 (ref 5–15)
BUN: 27 mg/dL — ABNORMAL HIGH (ref 8–23)
CO2: 27 mmol/L (ref 22–32)
Calcium: 8.4 mg/dL — ABNORMAL LOW (ref 8.9–10.3)
Chloride: 101 mmol/L (ref 98–111)
Creatinine, Ser: 1.42 mg/dL — ABNORMAL HIGH (ref 0.61–1.24)
GFR, Estimated: 53 mL/min — ABNORMAL LOW (ref >60)
Glucose, Bld: 237 mg/dL — ABNORMAL HIGH (ref 70–99)
Potassium: 5.3 mmol/L — ABNORMAL HIGH (ref 3.5–5.1)
Sodium: 134 mmol/L — ABNORMAL LOW (ref 135–145)

## 2020-05-27 LAB — CBC
HCT: 35.7 % — ABNORMAL LOW (ref 39.0–52.0)
Hemoglobin: 11.9 g/dL — ABNORMAL LOW (ref 13.0–17.0)
MCH: 30.7 pg (ref 26.0–34.0)
MCHC: 33.3 g/dL (ref 30.0–36.0)
MCV: 92.2 fL (ref 80.0–100.0)
Platelets: 231 10*3/uL (ref 150–400)
RBC: 3.87 MIL/uL — ABNORMAL LOW (ref 4.22–5.81)
RDW: 12.1 % (ref 11.5–15.5)
WBC: 9.5 10*3/uL (ref 4.0–10.5)
nRBC: 0 % (ref 0.0–0.2)

## 2020-05-27 LAB — SARS CORONAVIRUS 2 (TAT 6-24 HRS): SARS Coronavirus 2: NEGATIVE

## 2020-05-27 LAB — GLUCOSE, CAPILLARY: Glucose-Capillary: 225 mg/dL — ABNORMAL HIGH (ref 70–99)

## 2020-05-27 LAB — SURGICAL PCR SCREEN
MRSA, PCR: NEGATIVE
Staphylococcus aureus: NEGATIVE

## 2020-05-27 NOTE — Progress Notes (Signed)
PCP - Gardiner Fanti Cardiologist - Nanetta Batty  Chest x-ray - not needed  EKG - 03/04/20 Stress Test - 2013 ECHO - 03/26/20 Cardiac Cath - 06/22/15  Sleep Study - cant remember date CPAP - yes, wears at night, instructed to bring mask and tubing DOS  Type 1 Insulin pump to be reduced by 20 Dexcom for checking  Fasting Blood Sugar - 120s   Blood Thinner Instructions: Plavix LD 05/25/20 Aspirin Instructions: continue  ERAS Protcol - clears until 1045 PRE-SURGERY Ensure or G2- water given  COVID TEST- pending   Anesthesia review: yes, difficult airway [facility] Standard Note 07/19/19  Patient denies shortness of breath, fever, cough and chest pain at PAT appointment   All instructions explained to the patient, with a verbal understanding of the material. Patient agrees to go over the instructions while at home for a better understanding. Patient also instructed to self quarantine after being tested for COVID-19. The opportunity to ask questions was provided.

## 2020-05-27 NOTE — Progress Notes (Signed)
Left message for Dr. Garret Reddish scheduler for K results and CR results

## 2020-05-27 NOTE — Progress Notes (Signed)
Anesthesia Chart Review:  Case: [id number] Date/Time: 05/28/20 1236   Procedure: INTRAMEDULLARY NAIL OF RIGHT TIBIA, DEEP HARDWARE REMOVAL (Right )   Anesthesia type: General   Pre-op diagnosis: RIGHT TIBIAL SHAFT FRACTURE WITH RETAINED ORTHOPEDIC HARDWARE   [facility]: [facility] ROOM 12 / [facility] OR   Surgeons: Terance Hart, MD      DISCUSSION:  Patient is a 70 year old male scheduled for the above procedure. S/p right ankle fracture dislocation, s/p open reduction with tibiotalar joint repair, open treatment of tibial plafond fracture without fibular fixation, and tibiotalar joint arthrodesis 07/23/19 by Dr. Susa Simmonds.  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   DATE ANESTHESIA RECORD SUMMARY:   07/23/19 ORIF right ankle [facility]) IV induction. Oral airway, two handed mask ventilation required. Rigid stylet and video laryngoscopy, Glidescope and 4 used for anticipated difficulty. Grade 1 view. 7.5 mm ETT placed with 1 attempt.  07/09/15 AVR [facility]) IV induction.  Mask ventilation without difficulty. Oral airway. Glidescope and 4 used for anticipated difficult airway. Grade 1 view.  Vocal cord appeared normal. 8.0 mm ET tube placed with 1 attempt.  07/22/02 Neck Exploration [facility])* IV induction. Fiberoptic intubation attempted by Dr. Orson Slick. Unable to visualize cords. Two  handed jaw lift required for ventilation. Attempt X 3 to visualize cords, unsuccessful. Dr. Sharyl Nimrod attempted FOP x2. 7.0 ETT thru cords. Extremely difficult fiberoptic intubation, unsuccessful asleep. Awake ______ X 6 -->Dr. Sharyl Nimrod passed ETT on 4th attempt ______.  07/22/02 Thyroidectomy [facility])* IV induction but difficulty maintaining mask seal (full beard). Oral and nasal airways placed. DL X 1 with cricoid pressure to help visualize cords. 7.5 oral ETT placed. Documented as difficult intubation and difficult mask.  10/02/06 Right Eye Vitrectomy [facility])* Easy oral intubation with direct visualization, 7.0 ETT. Easy mask.   * Records scanned under Media tab, Correspondence, 07/09/15   Last cardiology evaluation with Dr. Rennis Golden 03/16/20 and Dr. [name] 03/04/20. Preoperative cardiology input outlined on 05/25/20 by Tereso Newcomer, PA-C, "...Surgeon wants him to stop Plavix starting tomorrow (5/24).    Recommendations:  Based on ACC/AHA guidelines, the patient is at acceptable risk for the planned procedure and may proceed without further cardiovascular testing.   Continue ASA without interruption.  Pt can hold Clopidogrel (Plavix) as requested.  This should be resumed post op as soon as it is safe." Last Plavix 05/25/20.   A1c 7.1% 05/06/20. He has an T:Slim insulin pump (insulin lispro U-100) and Dexcom G6 continuous glucose monitoring. At 05/06/20 endocrinology visit with Dr. Allena Katz, "I have adjusted the carb ratio as follows 12 AM 10, 5 AM 8, 5 PM 12. Patient will get more insulin during daytime with breakfast and lunch and he will get less insulin at dinnertime. Target blood sugar is 110 and correction factor is 40. Patient will continue to use the control IQ settings." Posted as same day surgery, but will at least notify DM coordinator of surgery plans since he has an insulin pump.  Presurgical COVID-19 test in process. Anesthesia team to evaluate  on the day of surgery.    VS: BP (!) 133/53   Pulse  87   Temp 37.2 C (Oral)   Resp 20   Ht 6\' 2"  (1.88 m)   Wt 106.6 kg   SpO2 95%   BMI 30.17 kg/m     PROVIDERS: Podraza, , PA-C is PCP [facility], see Care Everywhere) - [facility], MD is PV/CAD cardiologist. Nanetta Batty, MD is cardiologist ([facility]). He has gone between [facility] and [facility] for cardiology and PV care (seems dependent on his insurance). Whilte at [facility] he was seeing [facility], MD for cardiology and Mariea Stable, MD for vascular surgery. Alinda Sierras, MD is endocrinologist [facility] Endocrinologist - [facility], see Care Everywhere). Last visit 05/06/20.    LABS: Preoperative labs noted. K 5.3 (no mention of hemolysis). BUN 27, Cr 1.42, stable when compared to 02/05/20 labs from [facility].showing BUN 33, Cr 1.34.  A1c 7.1% 05/06/20 at [facility]. (all labs ordered are listed, but only abnormal results are displayed)  Labs Reviewed  GLUCOSE, CAPILLARY - Abnormal; Notable for the following components:      Result Value   Glucose-Capillary 225 (*)    All other components within normal limits  BASIC METABOLIC PANEL - Abnormal; Notable for the following components:   Sodium 134 (*)    Potassium 5.3 (*)    Glucose, Bld 237 (*)    BUN 27 (*)    Creatinine, Ser 1.42 (*)    Calcium 8.4 (*)    GFR, Estimated 53 (*)    All other components within normal limits  CBC - Abnormal; Notable for the following components:   RBC 3.87 (*)    Hemoglobin 11.9 (*)    HCT 35.7 (*)    All other components within normal limits  SURGICAL PCR SCREEN  SARS CORONAVIRUS 2 (TAT 6-24 HRS)  HEMOGLOBIN A1C    PFTs 07/06/15: FVC 4.06 (77%), FEV1 2.85 (73%), DLCOunc 23.58 (62%).   EKG: 03/04/20 ([facility]): NSR   CV: Echo 03/26/20: IMPRESSIONS  1. Left ventricular ejection fraction, by estimation, is 60 to 65%. The left ventricle has normal function. The left ventricle has no regional wall motion abnormalities. There is mild concentric  left ventricular hypertrophy. Left ventricular diastolic parameters are consistent with Grade II diastolic dysfunction (pseudonormalization). Elevated left atrial pressure.  2. Right ventricular systolic function is normal. The right ventricular size is normal.  3. Left atrial size was moderately dilated.  4. The mitral valve is normal in structure. Mild mitral valve regurgitation. No evidence of mitral stenosis.  5. The aortic valve is normal in structure. There is mild calcification of the aortic valve. There is moderate thickening of the aortic valve. Aortic valve regurgitation is not visualized. Mild to moderate aortic valve stenosis. Aortic valve mean gradient measures 15.0 mmHg. Aortic valve peak gradient measures 22.5 mmHg. Aortic valve area, by VTI measures 1.35 cm. There is a bovine valve present in the aortic position (Procedure Date:  07/09/2015).  6. The inferior vena cava is normal in size with greater than 50% respiratory variability, suggesting right atrial pressure of 3 mmHg.   Carotid 09/09/2015 03/19/20: Summary:  - Right Carotid: There is no evidence of stenosis in the right ICA. The ECA appears >50% stenosed. Patent distal CCA-proximal ICA stent.  - Left Carotid: Velocities in the left ICA are consistent with a 1-39% stenosis.  - Vertebrals: Bilateral vertebral arteries demonstrate antegrade flow.  - Subclavians: Normal flow  hemodynamics were seen in bilateral subclavian arteries.   Cardiac cath 06/22/15 (Pre-AVR):  Ost 2nd Mrg to 2nd Mrg lesion, 90% stenosed.  Mid Cx lesion, 85% stenosed.  Prox LAD lesion, 70% stenosed.  Mid LAD to Dist LAD lesion, 80% stenosed.  Ost RCA to Prox RCA lesion, 60% stenosed.  Mid RCA lesion, 80% stenosed.  Mid RCA to Dist RCA lesion, 90% stenosed.  Dist RCA lesion, 90% stenosed.  Cardiac event monitor 05/13/13-05/26/13: NSR.    Past Medical History:  Diagnosis Date  . Abnormal stress test 10/27/2011   lat isch--cardiac cath  10/28/2011  . Aortic stenosis, moderate Sept/Oct /2013   2017-> s/p bioprosthetic AV [facility] Ease bovine pericardial valve model 3300 TFX, ser # [id number]).  . 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 Inferolateral perfusion defect + EKG abnormality during stress testing prompted Cath by [facility]: 3V CAD, EF normal.  Med mgmt recommended; b. 06/2015 Cath: 3VD and mod AS; c. 07/2015 CABG x 3 (LIMA->LAD, VG->OM2, VG->PDA) w/ AVR.  [name] Carotid stenosis, right 09/2011   Dr. [name] did R carotid stenting 01/2014.  Carotid dopplers 06/2015 essentially normal.  No change 07/2016--repeat 1 yr.  . Complication of anesthesia    difficulty with intubation,   . Decreased pedal pulses    LE doppler 11/08/11- no evidence of arterial insufficiency  . Diabetes mellitus Dx'd age 3   Novolog via insulin pump; sub-optimal control x years  . Diabetic neuropathy (HCC)    fine touch and position sense affected  . Diabetic retinopathy    Proliferative: Hx of retinal detachment on right-light perception only in right eye  . Diastolic dysfunction 2007; 2016   TEE with diastolic dysfunction, EF 74%.  . Difficult intubation    ~ 2002 difficult fiberoptic intubation with takeback bleeding s/p thyroidectomy;  for thyroidectomy was intubated DL X 1 with cricoid pressure but difficult mask (full beard)  . Dizziness    feels off balance  . Erectile dysfunction    [place]rolog[place] in [place]  . GERD (gastroesophageal reflux disease)   . Headache    resolved  . Heart murmur   . History of tobacco abuse    Quit about 1990 (has 35 pack-yr hx)  . Hyperlipemia, mixed    a. intolerant of statins/zeta; per Dr. [name] 12/2016, pt started on PCSK9--incomplete responder?---01/2017.  [name] Hypothyroidism, postsurgical    Thyroidectomy 2002; multinodular goiter.  Dr. Elvera Lennox managing this as of 10/2015.  . Impaired vision    right eye light perception only; hx of retinal detachment.  . Impingement syndrome of right shoulder    08/26/15 Pt got  subacromial steroid injection by orthopedist (Dr. Delfin Edis).  [facility] f/u 01/2016--MRI showed RC tear + AC joint arthritis, arthroscopic surgery done 03/2016 ([facility]).  . Neuromuscular disorder (HCC)    neuropathy  . OSA (obstructive sleep apnea)    06/2011 sleep study: moderate OSA, CPAP at 12 cm H2O.  . Osteoarthritis    Bilat thumb carpometacarpal joints.  Ortho injected each thumb x 2 in 2017.  Also injected 03/14/17.  [name] Recurrent pneumonia   . Shortness of breath dyspnea   . Stroke [facility])    TIA  . TIA (transient ischemic attack) 2015/16   with R carotid dz: pt got R carotid stent 01/2014 by Dr. [name] and was put on dual antiplatelet therapy with ASA 325mg  and plavix 75mg  qd afterwards  . TIA (transient ischemic attack) 05/2015   Left brain (right sided numbness + slurred  speech)  Admitted for obs/workup 05/2015.  [name]. Venous insufficiency   . Venous reflux 10/14/2011   venous doppler-R GSV continuous reflux throughout; too small for VNUS closure    Past Surgical History:  Procedure Laterality Date  . ABI  01/2016   Normal.  Dr. Allyson SabalBerry to repeat in 1 yr.  . AORTIC VALVE REPLACEMENT N/A 07/09/2015   Bovine pericardial valve.  Procedure: AORTIC VALVE REPLACEMENT (AVR);  Surgeon: Loreli SlotSteven C Hendrickson, MD;  [facility];  Service: Open Heart Surgery;  Laterality: N/A;  . CARDIAC CATHETERIZATION  10/2011   EF normal.  Diffuse 3 vessel CAD, no stents placed.  Medical mgmt per [facility].  [name]. CARDIAC CATHETERIZATION N/A 06/22/2015   Procedure: Right/Left Heart Cath and Coronary Angiography;  Surgeon: Runell GessJonathan J Berry, MD;  [facility];  Service: Cardiovascular;  Laterality: N/A;  . carotid dopplers  06/30/15   Normal: repeat when clinically indicated per Dr. Allyson SabalBerry  . CAROTID STENT INSERTION Right 01/16/2014   Procedure: CAROTID STENT INSERTION;  Surgeon: Runell GessJonathan J Berry, MD;  [facility];  Service: Cardiovascular;  Laterality: Right;  . CATARACT EXTRACTION      left  . CORONARY ARTERY BYPASS GRAFT N/A 07/09/2015   Procedure: CORONARY ARTERY BYPASS GRAFTING (CABG)TIMES THREE USING LEFT INTERNAL MAMMARY ARTERY AND LEFT SAPHENOUS LEG VEIN HARVESTED ENDOSCOPICALLY;  Surgeon: Loreli SlotSteven C Hendrickson, MD;  [facility];  Service: Open Heart Surgery;  Laterality: N/A;  . EYE SURGERY     Multiple laser surgeries for diabetic retinopathy, also cataract surgery OU.  [name]. INTRAOPERATIVE TRANSESOPHAGEAL ECHOCARDIOGRAM N/A 07/09/2015   Procedure: INTRAOPERATIVE TRANSESOPHAGEAL ECHOCARDIOGRAM;  Surgeon: Loreli SlotSteven C Hendrickson, MD;  [facility];  Service: Open Heart Surgery;  Laterality: N/A;  . lasix eye    . LEFT AND RIGHT HEART CATHETERIZATION WITH CORONARY ANGIOGRAM N/A 10/28/2011   Procedure: LEFT AND R0IHT HEART CATHETERIZATION WITH CORONARY ANGIOGRAM;  Surgeon: Lennette Biharihomas A Kelly, MD;  [facility];  Service: Cardiovascular;  Laterality: N/A;  . ORIF ANKLE FRACTURE Right 07/23/2019   Procedure: OPEN REDUCTION RIGHT ANKLE DISLOCATION WITH REPAIR, OPEN TREATMENT OF TIBIAL PLAFOND FRACTURE WITH LATERAL MALLEOLUS, OPEN TREATMENT OF SYNDESMOSIS;  Surgeon: Terance HartAdair, Christopher R, MD;  [facility];  Service: Orthopedics;  Laterality: Right;  LENGTH OF SURGERY: 2.5 HOURS  . RETINAL DETACHMENT SURGERY     Right eye  . ROTATOR CUFF REPAIR W/ DISTAL CLAVICLE EXCISION Right 03/2016   Arthroscopic ([facility])  . THYROID SURGERY  2002  . TRANSTHORACIC ECHOCARDIOGRAM  10/2011;12/2012;01/2015; 08/2015; 07/28/16   Mod AS, mild LVH, EF 60-65%, no RWMA.  01/2015 EF 60-65%, grade I DD, progression of mod/sev AS.  08/2015 normal lv fxn with normal fxning bioprosthetic Ao valve.  07/2016--no change compared to 2017 echo.      MEDICATIONS: . albuterol (VENTOLIN HFA) 108 (90 Base) MCG/ACT inhaler  . aspirin EC 81 MG tablet  . calcium-vitamin D (OSCAL WITH D) 500-200 MG-UNIT TABS tablet  . cetirizine (ZYRTEC) 10 MG tablet  . Cholecalciferol (VITAMIN D3) 125 MCG (5000 UT) CAPS   . clopidogrel (PLAVIX) 75 MG tablet  . dimenhyDRINATE (DRAMAMINE) 50 MG tablet  . Evolocumab with Infusor (REPATHA PUSHTRONEX SYSTEM) 420 MG/3.5ML SOCT  . Feverfew 380 MG CAPS  . Insulin Human (INSULIN PUMP) SOLN  . insulin lispro (HUMALOG) 100 UNIT/ML injection  . levothyroxine (SYNTHROID) 175 MCG tablet  . loperamide (IMODIUM A-D) 2 MG tablet  . losartan-hydrochlorothiazide (HYZAAR) 100-12.5 MG tablet  . Multiple Vitamin (  MULTIVITAMIN WITH MINERALS) TABS tablet  . omeprazole (PRILOSEC OTC) 20 MG tablet  . rosuvastatin (CRESTOR) 5 MG tablet   No current facility-administered medications for this encounter.    Shonna Chock, PA-C Surgical Short Stay/Anesthesiology [facility] Phone [phone] [facility] Phone [phone] 05/27/2020 1:44 PM

## 2020-05-27 NOTE — Progress Notes (Signed)
Spoke directly with patient who verbalized understanding of new arrival time of 1050.

## 2020-05-27 NOTE — Anesthesia Preprocedure Evaluation (Addendum)
Anesthesia Evaluation  Patient identified by MRN, date of birth, ID band Patient awake    Reviewed: Allergy & Precautions, NPO status , Patient's Chart, lab work & pertinent test results  History of Anesthesia Complications (+) DIFFICULT AIRWAY and history of anesthetic complications (uneventful glidescope intubation)  Airway Mallampati: III  TM Distance: >3 FB Neck ROM: Full    Dental  (+) Teeth Intact, Dental Advisory Given   Pulmonary sleep apnea and Continuous Positive Airway Pressure Ventilation , former smoker,    Pulmonary exam normal breath sounds clear to auscultation       Cardiovascular hypertension, Pt. on medications + CAD, + CABG (07/2015 CABG x 3 (LIMA->LAD, VG->OM2, VG->PDA) w/ AVR), + Peripheral Vascular Disease (carotid artery stenosis (s/p RICA stent 01/16/14)) and +CHF  Normal cardiovascular exam+ Valvular Problems/Murmurs (2017-> s/p bioprosthetic AV [facility] Ease bovine pericardial valve ) AS  Rhythm:Regular Rate:Normal  Echo 03/26/20: IMPRESSIONS  1. Left ventricular ejection fraction, by estimation, is 60 to 65%. The left ventricle has normal function. The left ventricle has no regional wall motion abnormalities. There is mild concentric left ventricular hypertrophy. Left ventricular diastolic parameters are consistent with Grade II diastolic dysfunction (pseudonormalization). Elevated left atrial pressure.  2. Right ventricular systolic function is normal. The right ventricular size is normal.  3. Left atrial size was moderately dilated.  4. The mitral valve is normal in structure. Mild mitral valve regurgitation. No evidence of mitral stenosis.  5. The aortic valve is normal in structure. There is mild calcification of the aortic valve. There is moderate thickening of the aortic valve. Aortic valve regurgitation is not visualized. Mild to moderate aortic valve stenosis. Aortic valve mean gradient measures  15.0 mmHg. Aortic valve peak gradient measures 22.5 mmHg. Aortic valve area, by VTI measures 1.35 cm. There is a bovine valve present in the aortic position (Procedure Date:  07/09/2015).  6. The inferior vena cava is normal in size with greater than 50% respiratory variability, suggesting right atrial pressure of 3 mmHg.   Carotid US 03/19/20: Summary:  - Right Carotid: There is no evidence of stenosis in the right ICA. The ECA appears >50% stenosed. Patent distal CCA-proximal ICA stent.  - Left Carotid: Velocities in the left ICA are consistent with a 1-39% stenosis.  - Vertebrals: Bilateral vertebral arteries demonstrate antegrade flow.  - Subclavians: Normal flow hemodynamics were seen in bilateral subclavian arteries.   Cardiac cath6/19/17 (Pre-AVR):  Ost 2nd Mrg to 2nd Mrg lesion, 90% stenosed.  Mid Cx lesion, 85% stenosed.  Prox LAD lesion, 70% stenosed.  Mid LAD to Dist LAD lesion, 80% stenosed.  Ost RCA to Prox RCA lesion, 60% stenosed.  Mid RCA lesion, 80% stenosed.  Mid RCA to Dist RCA lesion, 90% stenosed.  Dist RCA lesion, 90% stenosed.  Cardiac event monitor5/11/15-5/24/15: NSR.    Neuro/Psych  Headaches, TIACVA, No Residual Symptoms negative psych ROS   GI/Hepatic Neg liver ROS, GERD  Medicated and Controlled,  Endo/Other  diabetes (insulin pump), Type 1, Insulin DependentHypothyroidism   Renal/GU negative Renal ROS  negative genitourinary   Musculoskeletal  (+) Arthritis ,   Abdominal   Peds  Hematology  (+) Blood dyscrasia (on plavix), ,   Anesthesia Other Findings   Reproductive/Obstetrics                         Anesthesia Physical Anesthesia Plan  ASA: III  Anesthesia Plan: General   Post-op Pain Management:    Induction: Intravenous  PONV Risk Score and Plan: 2 and Midazolam, Dexamethasone and Ondansetron  Airway Management Planned: Oral ETT  Additional Equipment:   Intra-op Plan:   Post-operative  Plan: Extubation in OR  Informed Consent: I have reviewed the patients History and Physical, chart, labs and discussed the procedure including the risks, benefits and alternatives for the proposed anesthesia with the patient or authorized representative who has indicated his/her understanding and acceptance.     Dental advisory given  Plan Discussed with: CRNA  Anesthesia Plan Comments: ( )      Anesthesia Quick Evaluation

## 2020-05-28 ENCOUNTER — Encounter (HOSPITAL_COMMUNITY): Payer: Self-pay | Admitting: Orthopaedic Surgery

## 2020-05-28 ENCOUNTER — Encounter (HOSPITAL_COMMUNITY)
Admission: RE | Disposition: A | Discharge status: Home / Self Care | Payer: Self-pay | Source: Home / Self Care | Attending: Orthopaedic Surgery

## 2020-05-28 ENCOUNTER — Ambulatory Visit (HOSPITAL_COMMUNITY): Payer: HMO | Admitting: Anesthesiology

## 2020-05-28 ENCOUNTER — Ambulatory Visit (HOSPITAL_COMMUNITY): Payer: HMO

## 2020-05-28 ENCOUNTER — Ambulatory Visit (HOSPITAL_COMMUNITY): Payer: HMO | Admitting: Vascular Surgery

## 2020-05-28 ENCOUNTER — Ambulatory Visit (HOSPITAL_COMMUNITY)
Admission: RE | Admit: 2020-05-28 | Discharge: 2020-05-28 | Disposition: A | Discharge status: Home / Self Care | Payer: HMO | Attending: Orthopaedic Surgery | Admitting: Orthopaedic Surgery

## 2020-05-28 DIAGNOSIS — Z87891 Personal history of nicotine dependence: Secondary | ICD-10-CM | POA: Diagnosis not present

## 2020-05-28 DIAGNOSIS — S82241A Displaced spiral fracture of shaft of right tibia, initial encounter for closed fracture: Secondary | ICD-10-CM | POA: Insufficient documentation

## 2020-05-28 DIAGNOSIS — Z7902 Long term (current) use of antithrombotics/antiplatelets: Secondary | ICD-10-CM | POA: Diagnosis not present

## 2020-05-28 DIAGNOSIS — X501XXA Overexertion from prolonged static or awkward postures, initial encounter: Secondary | ICD-10-CM | POA: Diagnosis not present

## 2020-05-28 DIAGNOSIS — Z951 Presence of aortocoronary bypass graft: Secondary | ICD-10-CM | POA: Diagnosis not present

## 2020-05-28 DIAGNOSIS — Z472 Encounter for removal of internal fixation device: Secondary | ICD-10-CM | POA: Insufficient documentation

## 2020-05-28 DIAGNOSIS — Z79890 Hormone replacement therapy (postmenopausal): Secondary | ICD-10-CM | POA: Diagnosis not present

## 2020-05-28 DIAGNOSIS — E11319 Type 2 diabetes mellitus with unspecified diabetic retinopathy without macular edema: Secondary | ICD-10-CM | POA: Insufficient documentation

## 2020-05-28 DIAGNOSIS — Z7982 Long term (current) use of aspirin: Secondary | ICD-10-CM | POA: Insufficient documentation

## 2020-05-28 DIAGNOSIS — E1165 Type 2 diabetes mellitus with hyperglycemia: Secondary | ICD-10-CM | POA: Diagnosis not present

## 2020-05-28 DIAGNOSIS — Z91048 Other nonmedicinal substance allergy status: Secondary | ICD-10-CM | POA: Diagnosis not present

## 2020-05-28 DIAGNOSIS — Z981 Arthrodesis status: Secondary | ICD-10-CM | POA: Insufficient documentation

## 2020-05-28 DIAGNOSIS — E1140 Type 2 diabetes mellitus with diabetic neuropathy, unspecified: Secondary | ICD-10-CM | POA: Diagnosis not present

## 2020-05-28 DIAGNOSIS — Z79899 Other long term (current) drug therapy: Secondary | ICD-10-CM | POA: Diagnosis not present

## 2020-05-28 DIAGNOSIS — Z9641 Presence of insulin pump (external) (internal): Secondary | ICD-10-CM | POA: Diagnosis not present

## 2020-05-28 DIAGNOSIS — Z419 Encounter for procedure for purposes other than remedying health state, unspecified: Secondary | ICD-10-CM

## 2020-05-28 DIAGNOSIS — Z794 Long term (current) use of insulin: Secondary | ICD-10-CM | POA: Diagnosis not present

## 2020-05-28 DIAGNOSIS — Z952 Presence of prosthetic heart valve: Secondary | ICD-10-CM | POA: Insufficient documentation

## 2020-05-28 HISTORY — PX: TIBIA IM NAIL INSERTION: SHX2516

## 2020-05-28 HISTORY — PX: HARDWARE REMOVAL: SHX979

## 2020-05-28 LAB — HEMOGLOBIN A1C
Hgb A1c MFr Bld: 7.6 % — ABNORMAL HIGH (ref 4.8–5.6)
Mean Plasma Glucose: 171 mg/dL

## 2020-05-28 LAB — GLUCOSE, CAPILLARY
Glucose-Capillary: 248 mg/dL — ABNORMAL HIGH (ref 70–99)
Glucose-Capillary: 217 mg/dL — ABNORMAL HIGH (ref 70–99)

## 2020-05-28 SURGERY — INSERTION, INTRAMEDULLARY ROD, TIBIA
Anesthesia: General | Site: Leg Lower | Laterality: Right

## 2020-05-28 MED ORDER — ONDANSETRON HCL 4 MG/2ML IJ SOLN
INTRAMUSCULAR | Status: DC | PRN
  Administered 2020-05-28: 4 mg via INTRAVENOUS

## 2020-05-28 MED ORDER — PHENYLEPHRINE HCL-NACL 10-0.9 MG/250ML-% IV SOLN: INTRAVENOUS | Status: DC | PRN

## 2020-05-28 MED ORDER — PHENYLEPHRINE 40 MCG/ML (10ML) SYRINGE FOR IV PUSH (FOR BLOOD PRESSURE SUPPORT)
PREFILLED_SYRINGE | INTRAVENOUS | Status: AC
  Filled 2020-05-28: qty 10

## 2020-05-28 MED ORDER — SUGAMMADEX SODIUM 200 MG/2ML IV SOLN
INTRAVENOUS | Status: DC | PRN
  Administered 2020-05-28: 200 mg via INTRAVENOUS

## 2020-05-28 MED ORDER — FENTANYL CITRATE (PF) 100 MCG/2ML IJ SOLN
INTRAMUSCULAR | Status: AC
  Filled 2020-05-28: qty 2

## 2020-05-28 MED ORDER — EPHEDRINE SULFATE 50 MG/ML IJ SOLN
INTRAMUSCULAR | Status: DC | PRN
  Administered 2020-05-28: 5 mg via INTRAVENOUS

## 2020-05-28 MED ORDER — ROCURONIUM BROMIDE 10 MG/ML (PF) SYRINGE
PREFILLED_SYRINGE | INTRAVENOUS | Status: DC | PRN
  Administered 2020-05-28: 100 mg via INTRAVENOUS

## 2020-05-28 MED ORDER — OXYCODONE HCL 5 MG PO TABS: 5.0000 mg | ORAL_TABLET | ORAL | 0 refills | Status: AC | PRN

## 2020-05-28 MED ORDER — CEFAZOLIN SODIUM-DEXTROSE 2-4 GM/100ML-% IV SOLN
2.0000 g | INTRAVENOUS | Status: AC
  Administered 2020-05-28: 2 g via INTRAVENOUS

## 2020-05-28 MED ORDER — LIDOCAINE 2% (20 MG/ML) 5 ML SYRINGE
INTRAMUSCULAR | Status: DC | PRN
  Administered 2020-05-28: 60 mg via INTRAVENOUS

## 2020-05-28 MED ORDER — ONDANSETRON HCL 4 MG/2ML IJ SOLN
INTRAMUSCULAR | Status: AC
  Filled 2020-05-28: qty 2

## 2020-05-28 MED ORDER — EPHEDRINE 5 MG/ML INJ
INTRAVENOUS | Status: AC
  Filled 2020-05-28: qty 10

## 2020-05-28 MED ORDER — PROPOFOL 10 MG/ML IV BOLUS
INTRAVENOUS | Status: AC
  Filled 2020-05-28: qty 20

## 2020-05-28 MED ORDER — FENTANYL CITRATE (PF) 100 MCG/2ML IJ SOLN
25.0000 ug | INTRAMUSCULAR | Status: DC | PRN
  Administered 2020-05-28: 25 ug via INTRAVENOUS

## 2020-05-28 MED ORDER — CEFAZOLIN SODIUM-DEXTROSE 2-4 GM/100ML-% IV SOLN
INTRAVENOUS | Status: AC
  Filled 2020-05-28: qty 100

## 2020-05-28 MED ORDER — ORAL CARE MOUTH RINSE: 15.0000 mL | Freq: Once | OROMUCOSAL | Status: AC

## 2020-05-28 MED ORDER — PHENYLEPHRINE HCL-NACL 10-0.9 MG/250ML-% IV SOLN
INTRAVENOUS | Status: DC | PRN
  Administered 2020-05-28: 30 ug/min via INTRAVENOUS

## 2020-05-28 MED ORDER — CHLORHEXIDINE GLUCONATE 0.12 % MT SOLN
OROMUCOSAL | Status: AC
  Administered 2020-05-28: 15 mL via OROMUCOSAL
  Filled 2020-05-28: qty 15

## 2020-05-28 MED ORDER — ROCURONIUM BROMIDE 10 MG/ML (PF) SYRINGE
PREFILLED_SYRINGE | INTRAVENOUS | Status: AC
  Filled 2020-05-28: qty 10

## 2020-05-28 MED ORDER — LACTATED RINGERS IV SOLN: INTRAVENOUS | Status: DC

## 2020-05-28 MED ORDER — FENTANYL CITRATE (PF) 250 MCG/5ML IJ SOLN
INTRAMUSCULAR | Status: AC
  Filled 2020-05-28: qty 5

## 2020-05-28 MED ORDER — FENTANYL CITRATE (PF) 250 MCG/5ML IJ SOLN
INTRAMUSCULAR | Status: DC | PRN
  Administered 2020-05-28: 100 ug via INTRAVENOUS

## 2020-05-28 MED ORDER — PROPOFOL 10 MG/ML IV BOLUS
INTRAVENOUS | Status: DC | PRN
  Administered 2020-05-28: 100 mg via INTRAVENOUS

## 2020-05-28 MED ORDER — PHENYLEPHRINE 40 MCG/ML (10ML) SYRINGE FOR IV PUSH (FOR BLOOD PRESSURE SUPPORT)
PREFILLED_SYRINGE | INTRAVENOUS | Status: DC | PRN
  Administered 2020-05-28: 120 ug via INTRAVENOUS
  Administered 2020-05-28: 160 ug via INTRAVENOUS

## 2020-05-28 MED ORDER — CHLORHEXIDINE GLUCONATE 0.12 % MT SOLN: 15.0000 mL | Freq: Once | OROMUCOSAL | Status: AC

## 2020-05-28 MED ORDER — LIDOCAINE 2% (20 MG/ML) 5 ML SYRINGE
INTRAMUSCULAR | Status: AC
  Filled 2020-05-28: qty 5

## 2020-05-28 MED ORDER — 0.9 % SODIUM CHLORIDE (POUR BTL) OPTIME
TOPICAL | Status: DC | PRN
  Administered 2020-05-28: 1000 mL

## 2020-05-28 SURGICAL SUPPLY — 65 items
BIT DRILL 4.0X165 AO STYLE (BIT) ×4 IMPLANT
BIT DRILL 4.0X280 (BIT) ×4 IMPLANT
BIT DRILL CANN LNG FLUTE 3.0 (BIT) ×3 IMPLANT
BIT DRILL CANNULATED 3.0 (BIT) ×4
BLADE SURG 15 STRL LF DISP TIS (BLADE) ×3 IMPLANT
BLADE SURG 15 STRL SS (BLADE) ×1
BNDG COHESIVE 4X5 TAN STRL (GAUZE/BANDAGES/DRESSINGS) IMPLANT
BNDG ELASTIC 4X5.8 VLCR STR LF (GAUZE/BANDAGES/DRESSINGS) ×4 IMPLANT
BNDG ELASTIC 6X10 VLCR STRL LF (GAUZE/BANDAGES/DRESSINGS) IMPLANT
BNDG ELASTIC 6X5.8 VLCR STR LF (GAUZE/BANDAGES/DRESSINGS) ×4 IMPLANT
COVER SURGICAL LIGHT HANDLE (MISCELLANEOUS) ×8 IMPLANT
DRAPE C-ARM 42X72 X-RAY (DRAPES) ×4 IMPLANT
DRAPE C-ARMOR (DRAPES) ×4 IMPLANT
DRAPE IMP U-DRAPE 54X76 (DRAPES) ×4 IMPLANT
DRAPE INCISE IOBAN 66X45 STRL (DRAPES) ×4 IMPLANT
DRAPE U-SHAPE 47X51 STRL (DRAPES) ×4 IMPLANT
DRAPE UNIVERSAL PACK (DRAPES) IMPLANT
DURAPREP 26ML APPLICATOR (WOUND CARE) ×8 IMPLANT
ELECT REM PT RETURN 9FT ADLT (ELECTROSURGICAL) ×4
ELECTRODE REM PT RTRN 9FT ADLT (ELECTROSURGICAL) ×3 IMPLANT
FACESHIELD OPICON LG (MASK) IMPLANT
GAUZE SPONGE 4X4 12PLY STRL (GAUZE/BANDAGES/DRESSINGS) ×4 IMPLANT
GAUZE XEROFORM 1X8 LF (GAUZE/BANDAGES/DRESSINGS) IMPLANT
GAUZE XEROFORM 5X9 LF (GAUZE/BANDAGES/DRESSINGS) ×4 IMPLANT
GLOVE BIOGEL M STRL SZ7.5 (GLOVE) ×4 IMPLANT
GLOVE SRG 8 PF TXTR STRL LF DI (GLOVE) ×3 IMPLANT
GLOVE SURG UNDER POLY LF SZ8 (GLOVE) ×1
GOWN STRL REUS W/ TWL LRG LVL3 (GOWN DISPOSABLE) ×3 IMPLANT
GOWN STRL REUS W/ TWL XL LVL3 (GOWN DISPOSABLE) ×3 IMPLANT
GOWN STRL REUS W/TWL LRG LVL3 (GOWN DISPOSABLE) ×1
GOWN STRL REUS W/TWL XL LVL3 (GOWN DISPOSABLE) ×1
GUIDEWIRE BALL NOSE 3.0X900 (WIRE) ×2
GUIDEWIRE ORTH 900X3XBALL NOSE (WIRE) ×6 IMPLANT
KIT BASIN OR (CUSTOM PROCEDURE TRAY) ×4 IMPLANT
KIT TURNOVER KIT B (KITS) ×4 IMPLANT
NAIL TIBIAL 9X37.5 (Nail) ×3 IMPLANT
PACK ORTHO EXTREMITY (CUSTOM PROCEDURE TRAY) ×4 IMPLANT
PACK UNIVERSAL I (CUSTOM PROCEDURE TRAY) ×4 IMPLANT
PAD ABD 8X10 STRL (GAUZE/BANDAGES/DRESSINGS) ×8 IMPLANT
PAD ARMBOARD 7.5X6 YLW CONV (MISCELLANEOUS) ×4 IMPLANT
PAD CAST 4YDX4 CTTN HI CHSV (CAST SUPPLIES) ×3 IMPLANT
PADDING CAST COTTON 4X4 STRL (CAST SUPPLIES) ×1
PADDING CAST COTTON 6X4 STRL (CAST SUPPLIES) ×4 IMPLANT
PADDING CAST SYN 6 (CAST SUPPLIES) ×1
PADDING CAST SYNTHETIC 4 (CAST SUPPLIES) ×1
PADDING CAST SYNTHETIC 4X4 STR (CAST SUPPLIES) ×3 IMPLANT
PADDING CAST SYNTHETIC 6X4 NS (CAST SUPPLIES) ×3 IMPLANT
PIN GUIDE THRD AR 3.2X330 (PIN) ×4 IMPLANT
SCREW LOCK CORT 5.0X38 (Screw) ×4 IMPLANT
SCREW LOCK CORT 5.0X40 (Screw) ×4 IMPLANT
SCREW LOCK FEM 5.0X75 (Screw) ×4 IMPLANT
SCREW LOCK FEM 5X44 (Screw) ×4 IMPLANT
SCREW LOW PROFILE 4.5X32 (Screw) ×4 IMPLANT
SPONGE LAP 18X18 RF (DISPOSABLE) ×4 IMPLANT
SPONGE LAP 18X18 X RAY DECT (DISPOSABLE) ×4 IMPLANT
STAPLER VISISTAT 35W (STAPLE) ×4 IMPLANT
STOCKINETTE IMPERVIOUS 9X36 MD (GAUZE/BANDAGES/DRESSINGS) ×4 IMPLANT
SUT ETHILON 2 0 FS 18 (SUTURE) ×8 IMPLANT
SUT MON AB 2-0 CT1 27 (SUTURE) IMPLANT
SUT MON AB 2-0 CT1 36 (SUTURE) ×8 IMPLANT
TIBIAL NAIL 9X37.5 (Nail) ×4 IMPLANT
TOWEL GREEN STERILE (TOWEL DISPOSABLE) ×4 IMPLANT
TOWEL GREEN STERILE FF (TOWEL DISPOSABLE) ×4 IMPLANT
TUBE CONNECTING 12X1/4 (SUCTIONS) ×4 IMPLANT
YANKAUER SUCT BULB TIP NO VENT (SUCTIONS) ×4 IMPLANT

## 2020-05-28 NOTE — Discharge Instructions (Signed)
DR. Susa Simmonds FOOT & ANKLE SURGERY POST-OP INSTRUCTIONS   Pain Management 1. The numbing medicine and your leg will last around 18 hours, take a dose of your pain medicine as soon as you feel it wearing off to avoid rebound pain. 2. Keep your foot elevated above heart level.  Make sure that your heel hangs free ('floats'). 3. Take all prescribed medication as directed. 4. If taking narcotic pain medication you may want to use an over-the-counter stool softener to avoid constipation. 5. You may take over-the-counter NSAIDs (ibuprofen, naproxen, etc.) as well as over-the-counter acetaminophen as directed on the packaging as a supplement for your pain and may also use it to wean away from the prescription medication.  Activity ? Touchdown weightbearing to operative leg in walking boot.  First Postoperative Visit 1. Your first postop visit will be at least 2 weeks after surgery.  This should be scheduled when you schedule surgery. 2. If you do not have a postoperative visit scheduled please call [phone] to schedule an appointment. 3. At the appointment your incision will be evaluated for suture removal, x-rays will be obtained if necessary.  General Instructions 1. Swelling is very common after foot and ankle surgery.  It often takes 3 months for the foot and ankle to begin to feel comfortable.  Some amount of swelling will persist for 6-12 months. 2. DO NOT change the dressing.  If there is a problem with the dressing (too tight, loose, gets wet, etc.) please contact Dr. Donnie Mesa office. 3. DO NOT get the dressing wet.  For showers you can use an over-the-counter cast cover or wrap a washcloth around the top of your dressing and then cover it with a plastic bag and tape it to your leg. 4. DO NOT soak the incision (no tubs, pools, bath, etc.) until you have approval from Dr. Susa Simmonds.  Contact Dr. Garret Reddish office or go to Emergency Room if: 1. Temperature above 101 F. 2. Increasing pain that is  unresponsive to pain medication or elevation 3. Excessive redness or swelling in your foot 4. Dressing problems - excessive bloody drainage, looseness or tightness, or if dressing gets wet 5. Develop pain, swelling, warmth, or discoloration of your calf

## 2020-05-28 NOTE — Anesthesia Procedure Notes (Signed)
Procedure Name: Intubation Date/Time: 05/28/2020 4:07 PM Performed by: Epifanio Lesches, CRNA Pre-anesthesia Checklist: Patient identified, Emergency Drugs available, Suction available and Patient being monitored Patient Re-evaluated:Patient Re-evaluated prior to induction Oxygen Delivery Method: Circle System Utilized Preoxygenation: Pre-oxygenation with 100% oxygen Induction Type: IV induction Ventilation: Nasal airway inserted- appropriate to patient size and Two handed mask ventilation required Laryngoscope Size: Glidescope and 4 Grade View: Grade I Tube type: Oral Tube size: 7.5 mm Number of attempts: 1 Airway Equipment and Method: Stylet and Oral airway Placement Confirmation: ETT inserted through vocal cords under direct vision,  positive ETCO2 and breath sounds checked- equal and bilateral Secured at: 23 cm Tube secured with: Tape Dental Injury: Teeth and Oropharynx as per pre-operative assessment

## 2020-05-28 NOTE — H&P (Signed)
PREOPERATIVE H&P  Chief Complaint: Right leg pain  HPI: [name] is a 70 y.o. male who presents for preoperative history and physical with a diagnosis of right tibia fracture.  Patient underwent ankle arthrodesis on the right side on 07/24/2019 and went on to uneventful healing.  He was standing behind his car and twisted his leg.  He had a pain and felt a pop in his leg.  X-rays revealed a nondisplaced spiral fracture of the tibia above his ankle arthrodesis.  He was seen in the office where discussion was had regarding conservative or nonconservative treatment.  He opted proceed with surgery.  He has been maintaining nonweightbearing with long-leg splint immobilization.  He is here today to have surgery. Symptoms are rated as moderate to severe, and have been worsening.  This is significantly impairing activities of daily living.  He has elected for surgical management.  Patient has relatively poorly controlled diabetes A1c yesterday was 7.6.  He does have relatively dense neuropathy.  Past Medical History:  Diagnosis Date  . Abnormal stress test 10/27/2011   lat isch--cardiac cath 10/28/2011  . Aortic stenosis, moderate Sept/Oct /2013   2017-> s/p bioprosthetic AV [facility] Ease bovine pericardial valve model 3300 TFX, ser # [id number]).  . 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 Inferolateral perfusion defect + EKG abnormality during stress testing prompted Cath by [facility]: 3V CAD, EF normal.  Med mgmt recommended; b. 06/2015 Cath: 3VD and mod AS; c. 07/2015 CABG x 3 (LIMA->LAD, VG->OM2, VG->PDA) w/ AVR.  [name] Carotid stenosis, right 09/2011   Dr. [name] did R carotid stenting 01/2014.  Carotid dopplers 06/2015 essentially normal.  No change 07/2016--repeat 1 yr.  . Complication of anesthesia    difficulty with intubation,   . Decreased pedal pulses    LE doppler 11/08/11- no evidence of arterial insufficiency  . Diabetes mellitus Dx'd age 1   Novolog via insulin pump; sub-optimal control x  years  . Diabetic neuropathy (HCC)    fine touch and position sense affected  . Diabetic retinopathy    Proliferative: Hx of retinal detachment on right-light perception only in right eye  . Diastolic dysfunction 2007; 2016   TEE with diastolic dysfunction, EF 74%.  . Difficult intubation    ~ 2002 difficult fiberoptic intubation with takeback bleeding s/p thyroidectomy;  for thyroidectomy was intubated DL X 1 with cricoid pressure but difficult mask (full beard)  . Dizziness    feels off balance  . Erectile dysfunction    [place]rolog[place] in [place]  . GERD (gastroesophageal reflux disease)   . Headache    resolved  . Heart murmur   . History of tobacco abuse    Quit about 1990 (has 35 pack-yr hx)  . Hyperlipemia, mixed    a. intolerant of statins/zeta; per Dr. [name] 12/2016, pt started on PCSK9--incomplete responder?---01/2017.  [name] Hypothyroidism, postsurgical    Thyroidectomy 2002; multinodular goiter.  Dr. Elvera Lennox managing this as of 10/2015.  . Impaired vision    right eye light perception only; hx of retinal detachment.  . Impingement syndrome of right shoulder    08/26/15 Pt got subacromial steroid injection by orthopedist (Dr. Delfin Edis).  [facility] f/u 01/2016--MRI showed RC tear + AC joint arthritis, arthroscopic surgery done 03/2016 (ortho[facility]).  . Neuromuscular disorder (HCC)    neuropathy  . OSA (obstructive sleep apnea)    06/2011 sleep study: moderate OSA, CPAP at 12 cm H2O.  . Osteoarthritis    Bilat thumb  carpometacarpal joints.  Ortho injected each thumb x 2 in 2017.  Also injected 03/14/17.  [name]. Recurrent pneumonia   . Shortness of breath dyspnea   . Stroke [facility](HCC)    TIA  . TIA (transient ischemic attack) 2015/16   with R carotid dz: pt got R carotid stent 01/2014 by Dr. Allyson SabalBerry and was put on dual antiplatelet therapy with ASA 325mg  and plavix 75mg  qd afterwards  . TIA (transient ischemic attack) 05/2015   Left brain (right sided numbness + slurred speech)   Admitted for obs/workup 05/2015.  [name]. Venous insufficiency   . Venous reflux 10/14/2011   venous doppler-R GSV continuous reflux throughout; too small for VNUS closure   Past Surgical History:  Procedure Laterality Date  . ABI  01/2016   Normal.  Dr. Allyson SabalBerry to repeat in 1 yr.  . AORTIC VALVE REPLACEMENT N/A 07/09/2015   Bovine pericardial valve.  Procedure: AORTIC VALVE REPLACEMENT (AVR);  Surgeon: Loreli SlotSteven C Hendrickson, MD;  [facility];  Service: Open Heart Surgery;  Laterality: N/A;  . CARDIAC CATHETERIZATION  10/2011   EF normal.  Diffuse 3 vessel CAD, no stents placed.  Medical mgmt per [facility].  [name]. CARDIAC CATHETERIZATION N/A 06/22/2015   Procedure: Right/Left Heart Cath and Coronary Angiography;  Surgeon: Runell GessJonathan J Berry, MD;  [facility];  Service: Cardiovascular;  Laterality: N/A;  . carotid dopplers  06/30/15   Normal: repeat when clinically indicated per Dr. Allyson SabalBerry  . CAROTID STENT INSERTION Right 01/16/2014   Procedure: CAROTID STENT INSERTION;  Surgeon: Runell GessJonathan J Berry, MD;  [facility];  Service: Cardiovascular;  Laterality: Right;  . CATARACT EXTRACTION     left  . CORONARY ARTERY BYPASS GRAFT N/A 07/09/2015   Procedure: CORONARY ARTERY BYPASS GRAFTING (CABG)TIMES THREE USING LEFT INTERNAL MAMMARY ARTERY AND LEFT SAPHENOUS LEG VEIN HARVESTED ENDOSCOPICALLY;  Surgeon: Loreli SlotSteven C Hendrickson, MD;  [facility];  Service: Open Heart Surgery;  Laterality: N/A;  . EYE SURGERY     Multiple laser surgeries for diabetic retinopathy, also cataract surgery OU.  [name]. INTRAOPERATIVE TRANSESOPHAGEAL ECHOCARDIOGRAM N/A 07/09/2015   Procedure: INTRAOPERATIVE TRANSESOPHAGEAL ECHOCARDIOGRAM;  Surgeon: Loreli SlotSteven C Hendrickson, MD;  [facility];  Service: Open Heart Surgery;  Laterality: N/A;  . lasix eye    . LEFT AND RIGHT HEART CATHETERIZATION WITH CORONARY ANGIOGRAM N/A 10/28/2011   Procedure: LEFT AND R0IHT HEART CATHETERIZATION WITH CORONARY ANGIOGRAM;  Surgeon: Lennette Biharihomas A  Kelly, MD;  [facility];  Service: Cardiovascular;  Laterality: N/A;  . ORIF ANKLE FRACTURE Right 07/23/2019   Procedure: OPEN REDUCTION RIGHT ANKLE DISLOCATION WITH REPAIR, OPEN TREATMENT OF TIBIAL PLAFOND FRACTURE WITH LATERAL MALLEOLUS, OPEN TREATMENT OF SYNDESMOSIS;  Surgeon: Terance HartAdair, Lynnley Doddridge R, MD;  [facility];  Service: Orthopedics;  Laterality: Right;  LENGTH OF SURGERY: 2.5 HOURS  . RETINAL DETACHMENT SURGERY     Right eye  . ROTATOR CUFF REPAIR W/ DISTAL CLAVICLE EXCISION Right 03/2016   Arthroscopic ([facility])  . THYROID SURGERY  2002  . TRANSTHORACIC ECHOCARDIOGRAM  10/2011;12/2012;01/2015; 08/2015; 07/28/16   Mod AS, mild LVH, EF 60-65%, no RWMA.  01/2015 EF 60-65%, grade I DD, progression of mod/sev AS.  08/2015 normal lv fxn with normal fxning bioprosthetic Ao valve.  07/2016--no change compared to 2017 echo.     Social History   Socioeconomic History  . Marital status: Married    Spouse name: Not on file  . Number of children: Not on file  . Years of  education: Not on file  . Highest education level: Not on file  Occupational History  . Not on file  Tobacco Use  . Smoking status: Former Smoker    Quit date: 1983    Years since quitting: 39.4  . Smokeless tobacco: Never Used  Vaping Use  . Vaping Use: Never used  Substance and Sexual Activity  . Alcohol use: Not Currently    Alcohol/week: 0.0 standard drinks  . Drug use: No  . Sexual activity: Not on file  Other Topics Concern  . Not on file  Social History Narrative   Divorced x1, remarried.  Has 3 daughters from first marriage.   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   Distant tobacco abuse (35 pack-yr hx), quit in the late 1990s, no ETOH or drugs.   No exercise.   Social Determinants of Health   Financial Resource Strain: Not on file  Food Insecurity: Not on file  Transportation  Needs: Not on file  Physical Activity: Not on file  Stress: Not on file  Social Connections: Not on file   Family History  Problem Relation Age of Onset  . Cancer Mother        lung cancer  . Alcohol abuse Father   . Cancer Father        laryngeal cancer  . Cancer Brother        oldest brother had lung cancer and melanoma   Allergies  Allergen Reactions  . Rosuvastatin Calcium Other (See Comments)    Problem with higher dosages (Lipitor caused memory issues), also caused leg pains and lethargy  . Statins Other (See Comments)    Problem with higher dosages (Lipitor caused memory issues), also caused leg pains and lethargy  . Tape Rash and Other (See Comments)    Adhesive tape.  (Paper tape OK)   Prior to Admission medications   Medication Sig Start Date End Date Taking? Authorizing Provider  aspirin EC 81 MG tablet Take 81 mg by mouth daily.   Yes [provider]  calcium-vitamin D (OSCAL WITH D) 500-200 MG-UNIT TABS tablet Take 2 tablets by mouth daily.   Yes [provider]  cetirizine (ZYRTEC) 10 MG tablet Take 10 mg by mouth daily.   Yes [provider]  Cholecalciferol (VITAMIN D3) 125 MCG (5000 UT) CAPS Take 5,000 Units by mouth daily.   Yes [provider]  clopidogrel (PLAVIX) 75 MG tablet TAKE 1 TABLET BY MOUTH EVERY DAY Patient taking differently: Take 75 mg by mouth daily. 07/04/16  Yes Runell Gess, MD  dimenhyDRINATE (DRAMAMINE) 50 MG tablet Take 50 mg by mouth every 8 (eight) hours as needed for dizziness.   Yes [provider]  Evolocumab with Infusor (REPATHA PUSHTRONEX SYSTEM) 420 MG/3.5ML SOCT Inject 1 Dose into the skin every 30 (thirty) days. 09/25/19  Yes Hilty, Lisette Abu, MD  Feverfew 380 MG CAPS Take 380 mg by mouth daily. To prevent migraines   Yes [provider]  Insulin Human (INSULIN PUMP) SOLN Inject into the skin continuous. Basal rate .95/hr, current pump settings: 12am .7, 2am 1, 7am 1.1, 11am  .9, 5pm .9.25, 11pm .85. Uses Humalog U-100 Insulin.   Yes [provider]  insulin lispro (HUMALOG) 100 UNIT/ML injection Use 60 units via insulin pump. Patient taking differently: Use via insulin pump. 08/01/16  Yes Carlus Pavlov, MD  levothyroxine (  SYNTHROID) 175 MCG tablet Take 175 mcg by mouth daily before breakfast. 06/26/19  Yes [provider]  loperamide (IMODIUM A-D) 2 MG tablet Take 2 mg by mouth 4 (four) times daily as needed for diarrhea or loose stools.   Yes [provider]  losartan-hydrochlorothiazide (HYZAAR) 100-12.5 MG tablet Take 1 tablet by mouth daily. 06/26/19  Yes [provider]  Multiple Vitamin (MULTIVITAMIN WITH MINERALS) TABS tablet Take 1 tablet by mouth daily. Multivitamin for Men 50+   Yes [provider]  omeprazole (PRILOSEC OTC) 20 MG tablet Take 20 mg by mouth daily before breakfast.    Yes [provider]  rosuvastatin (CRESTOR) 5 MG tablet Take 1 tablet (5 mg total) by mouth daily. 03/16/20 06/14/20 Yes Hilty, Lisette Abu, MD  albuterol (VENTOLIN HFA) 108 (90 Base) MCG/ACT inhaler Inhale 2 puffs into the lungs every 6 (six) hours as needed for wheezing or shortness of breath.    [provider]     Positive ROS: All other systems have been reviewed and were otherwise negative with the exception of those mentioned in the HPI and as above.  Physical Exam:  Vitals:   05/28/20 1100  BP: (!) 133/51  Pulse: 94  Resp: 18  Temp: 98.3 F (36.8 C)  SpO2: 98%   General: Alert, no acute distress Cardiovascular: No pedal edema Respiratory: No cyanosis, no use of accessory musculature GI: No organomegaly, abdomen is soft and non-tender Skin: No lesions in the area of chief complaint Neurologic: Sensory deficits to bilateral feet. Psychiatric: Patient is competent for consent with normal mood and affect Lymphatic: No axillary or cervical lymphadenopathy  MUSCULOSKELETAL: Right leg in a long-leg  splint.  There is swelling to the forefoot.  He is able to wiggle his toes.  Baseline sensory deficits.  No tenderness proximal to the knee.  No evidence of left lower or bilateral upper extremity injury.  Assessment: Right spiral tibia fracture in the setting of diabetes with hemoglobin A1c of 7.6 Retained orthopedic hardware from ankle arthrodesis   Plan: Plan for intramedullary nail of right tibia.  We will need to remove screws from the proximal aspect of his ankle arthrodesis plate and redirect these due to the need to bypass the plate with the nail.  We discussed the risks, benefits and alternatives of surgery which include but are not limited to wound healing complications, infection, nonunion, malunion, need for further surgery, damage to surrounding structures and continued pain.  They understand there is no guarantees to an acceptable outcome.  After weighing these risks they opted to proceed with surgery.     Terance Hart, MD    05/28/2020 3:22 PM

## 2020-05-28 NOTE — Transfer of Care (Signed)
Immediate Anesthesia Transfer of Care Note  Patient: [name]  Procedure(s) Performed: INTRAMEDULLARY NAIL RIGHT TIBIA (Right Leg Lower) HARDWARE REMOVAL AND EXCHANGE RIGHT ANKLE (Right Ankle)  Patient Location: PACU  Anesthesia Type:General  Level of Consciousness: awake and alert   Airway & Oxygen Therapy: Patient Spontanous Breathing and Patient connected to face mask oxygen  Post-op Assessment: Report given to RN and Post -op Vital signs reviewed and stable  Post vital signs: Reviewed and stable  Last Vitals:  Vitals Value Taken Time  BP    Temp    Pulse    Resp    SpO2      Last Pain:  Vitals:   05/28/20 1139  TempSrc:   PainSc: 0-No pain         Complications: No complications documented.

## 2020-05-29 NOTE — Op Note (Signed)
[name] male 70 y.o. 05/28/2020   PreOperative Diagnosis: Right tibial shaft fracture Retained orthopedic hardware, deep  PostOperative Diagnosis: same  PROCEDURE: Intramedullary nail fixation of right tibia with interlocking screws Removal of deep orthopedic hardware  SURGEON: Dub Mikes, MD  ASSISTANT: none  ANESTHESIA: General endotracheal tube anesthesia  FINDINGS: See below  IMPLANTS: Arthrex tibial nail, 9 x 37.5  INDICATIONS:69 y.o. male had an ankle arthrodesis in July 2021.  He healed this uneventfully.  Proximal Shirley 1 week ago he twisted his leg while getting out of the car and heard a pop and had pain in his tibia.  He was seen at the urgent care and subsequently in the office where he was diagnosed with a spiral tibia fracture above his ankle arthrodesis.  Given the type of fracture he was indicated for intramedullary nail fixation.  He did have screws within the proximal aspect of his arthrodesis plate that would need to be removed prior to nail placement.  He understood this. Patient is a diabetic and  Recent hemoglobin A1c was 7.6.  Patient understood the risks, benefits and alternatives to surgery which include but are not limited to wound healing complications, infection, nonunion, malunion, need for further surgery as well as damage to surrounding structures. They also understood the potential for continued pain in that there were no guarantees of acceptable outcome After weighing these risks the patient opted to proceed with surgery.  PROCEDURE: Patient was identified in the preoperative holding area.  The right leg was marked by myself.  Consent was signed by myself and the patient.  Block was performed by anesthesia in the preoperative holding area.  Patient was taken to the operative suite and placed supine on the operative table.  General endotracheal tube anesthesia was induced without difficulty. Bump was placed under the operative hip and  bone foam was used.  All bony prominences were well padded.  Preoperative antibiotics were given. The extremity was prepped and draped in the usual sterile fashion and surgical timeout was performed.    We began by making an longitudinal incision distally over the prior ankle arthrodesis incision.  This was done at for proximal me 3-4 cm at the proximal aspect of the prior incision.  It was taken sharply down through skin and subcutaneous tissue.  The PLS anterior tendon was identified and the sheath was incised overlying the tendon.  The tendon was retracted.  Deep to the tendon the sheath was incised and the plate was identified.  The plate was then exposed through blunt dissection using a Freer elevator overlying the plate.  The 3 proximal screws were identified and they were removed sequentially without difficulty using a screwdriver.  The screws were placed on the back table.  Then a triangle was placed under the knee.  A separate incision was made on the lateral aspect of the patellar tendon.  It was taken sharply down through skin and subcutaneous tissue.  The retinacular tissue overlying the patellar tendon was identified.  This was incised along the lateral aspect of the patellar tendon.  The joint was not violated.  Then the proximal aspect of the tibia was accessed.  Using an awl the appropriate starting position of the nail was identified using fluoroscopic guidance.  Then the awl was inserted into the proximal tibia.  Then the bead-tipped guidewire with a small bend at the tip was placed within this entry spot down to the center center position at the ankle.  This bypassed the  prior ankle arthrodesis plate.  Then the appropriate measurement was obtained and found to be 37.5.  Then over the bead tip guidewire we sequentially reamed to a 10.5 mm.  A 9 nail was chosen.  There was good chatter at 10.5 within the diaphysis of the bone.  Then the tibial nail was placed without difficulty.  After placement  of the nail 2 proximal interlocking screws were placed through the guide.  Then 2 distal interlocking screws were placed in the perfect circle technique.  There was acceptable reduction of the fracture site after placement of the nail.  We then turned our attention back to the distal ankle.  The plate was overlying the nail and the prior screw holes for the plate were not able to be used.  We redirected the central screw hole medial to the nail and replaced a screw within the proximal portion of the arthrodesis plate.  Was good bite on this screw.  Then final fluoroscopic images were obtained.  The wounds were then irrigated with normal saline.  They were closed in a layered fashion using 2-0 Monocryl and staples.  Patient was then awakened from anesthesia and taken recovery in stable condition.  There were no complications.  POST OPERATIVE INSTRUCTIONS: Touchdown weightbearing to right lower extremity. Patient has a boot at home and will use this when ambulating Follow-up in 2 weeks for wound check, x-rays of the right tibia on arrival and likely suture removal Patient takes Plavix at baseline and will use this for DVT prophylaxis  TOURNIQUET TIME:no tourniquet was used  BLOOD LOSS:  250 ml         DRAINS: none         SPECIMEN: none       COMPLICATIONS:  * No complications entered in OR log *         Disposition: PACU - hemodynamically stable.         Condition: stable

## 2020-05-29 NOTE — Anesthesia Postprocedure Evaluation (Signed)
Anesthesia Post Note  Patient: DAKHARI ZUVER  Procedure(s) Performed: INTRAMEDULLARY NAIL RIGHT TIBIA (Right Leg Lower) HARDWARE REMOVAL AND EXCHANGE RIGHT ANKLE (Right Ankle)     Patient location during evaluation: PACU Anesthesia Type: General Level of consciousness: awake and alert Pain management: pain level controlled Vital Signs Assessment: post-procedure vital signs reviewed and stable Respiratory status: spontaneous breathing, nonlabored ventilation, respiratory function stable and patient connected to nasal cannula oxygen Cardiovascular status: blood pressure returned to baseline and stable Postop Assessment: no apparent nausea or vomiting Anesthetic complications: no   No complications documented.  Last Vitals:  Vitals:   05/28/20 1824 05/28/20 1839  BP: 107/65 (!) 120/58  Pulse: 92 96  Resp: 14 18  Temp:  (!) 36.2 C  SpO2: 98% 93%    Last Pain:  Vitals:   05/28/20 1824  TempSrc:   PainSc: 2    Pain Goal:                   Purvi Ruehl L Ladeana Laplant

## 2020-06-03 ENCOUNTER — Encounter (HOSPITAL_COMMUNITY): Payer: Self-pay | Admitting: Orthopaedic Surgery

## 2020-06-16 LAB — LIPID PANEL
Chol/HDL Ratio: 2.3 ratio (ref 0.0–5.0)
Cholesterol, Total: 118 mg/dL (ref 100–199)
HDL: 51 mg/dL (ref >39)
LDL Chol Calc (NIH): 52 mg/dL (ref 0–99)
Triglycerides: 70 mg/dL (ref 0–149)
VLDL Cholesterol Cal: 15 mg/dL (ref 5–40)

## 2020-08-05 DIAGNOSIS — B369 Superficial mycosis, unspecified: Secondary | ICD-10-CM | POA: Insufficient documentation

## 2020-10-06 ENCOUNTER — Telehealth (HOSPITAL_BASED_OUTPATIENT_CLINIC_OR_DEPARTMENT_OTHER): Payer: Self-pay

## 2020-10-11 ENCOUNTER — Other Ambulatory Visit: Payer: Self-pay | Admitting: Internal Medicine

## 2020-10-15 ENCOUNTER — Other Ambulatory Visit: Payer: Self-pay

## 2020-10-15 DIAGNOSIS — I739 Peripheral vascular disease, unspecified: Secondary | ICD-10-CM

## 2020-10-22 NOTE — Progress Notes (Signed)
[facility] [place]  ASSESSMENT / PLAN: Dr. Allyson Carter pt [name] is a 70 y.o. 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  Patient counseled patients with chronic limb threatening ischemia have an annual risk of cardiovascular mortality of 25% and a high risk of amputation.   WIfI score calculated based on clinical exam and non-invasive measurements. Stage 3: expected 25% risk of major amputation within the year; revascularization likely to reduce risk of amputation.  Recommend the following which can slow the progression of atherosclerosis and reduce the risk of major adverse cardiac / limb events:  Complete cessation from all tobacco products. Blood glucose control with goal A1c < 7%. Blood pressure control with goal blood pressure < 140/90 mmHg. Lipid reduction therapy with goal LDL-C <100 mg/dL (<37 if symptomatic from PAD).  Aspirin 81mg  PO QD.  Atorvastatin 40-80mg  PO QD (or other "high intensity" statin therapy).  Discussed with Dr. , he does not have time to do an angiogram for him until 11/02/2020.  Plan right lower extremity angiogram with possible intervention via left common femoral artery approach in cath lab next week with me or one of my partners.   CHIEF COMPLAINT: right foot ulcer  HISTORY OF PRESENT ILLNESS: [name] is a 70 y.o. male who presents to clinic for evaluation of peripheral arterial disease. He underwent intramedullary nail of the right tibia and removal of deep orthopedic hardware on 05/28/2020.  He developed an ulcer on his posterior heel shortly thereafter.  The ulcer has been slow to heal since that time.  The patient has also noticed a new wound develop on his right great toe.  Prior to his orthopedic difficulty, the patient reports a fairly classic history of claudication.  He has neuropathy in does not have much sensation in his feet.  He has an extensive past  cardiovascular history.  He has been taken care of by Dr. 05/30/2020 in the past.  Past Medical History:  Diagnosis Date   Abnormal stress test 10/27/2011   lat isch--cardiac cath 10/28/2011   Aortic stenosis, moderate Sept/Oct /2013   2017-> s/p bioprosthetic AV [facility] Ease bovine pericardial valve model 3300 TFX, ser # [facility]).   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 Inferolateral perfusion defect + EKG abnormality during stress testing prompted Cath by [facility]: 3V CAD, EF normal.  Med mgmt recommended; b. 06/2015 Cath: 3VD and mod AS; c. 07/2015 CABG x 3 (LIMA->LAD, VG->OM2, VG->PDA) w/ AVR.   Carotid stenosis, right 09/2011   Dr. 10/2011 did R carotid stenting 01/2014.  Carotid dopplers 06/2015 essentially normal.  No change 07/2016--repeat 1 yr.   Complication of anesthesia    difficulty with intubation,    Decreased pedal pulses    LE doppler 11/08/11- no evidence of arterial insufficiency   Diabetes mellitus Dx'd age 80   Novolog via insulin pump; sub-optimal control x years   Diabetic neuropathy (HCC)    fine touch and position sense affected   Diabetic retinopathy    Proliferative: Hx of retinal detachment on right-light perception only in right eye   Diastolic dysfunction 2007; 2016   TEE with diastolic dysfunction, EF 74%.   Difficult intubation    ~ 2002 difficult fiberoptic intubation with takeback bleeding s/p thyroidectomy;  for thyroidectomy was intubated DL X 1 with cricoid pressure but difficult mask (full beard)   Dizziness    feels off balance   Erectile dysfunction  [place]rolog[place]   GERD (gastroesophageal reflux disease)    Headache    resolved   Heart murmur    History of tobacco abuse    Quit about 1990 (has 35 pack-yr hx)   Hyperlipemia, mixed    a. intolerant of statins/zeta; per Dr. Allyson Carter 12/2016, pt started on PCSK9--incomplete responder?---01/2017.   Hypothyroidism, postsurgical    Thyroidectomy 2002; multinodular goiter.  Dr. Elvera Lennox managing this  as of 10/2015.   Impaired vision    right eye light perception only; hx of retinal detachment.   Impingement syndrome of right shoulder    08/26/15 Pt got subacromial steroid injection by orthopedist (Dr. Delfin Edis).  [facility] f/u 01/2016--MRI showed RC tear + AC joint arthritis, arthroscopic surgery done 03/2016 ([place]).   Neuromuscular disorder (HCC)    neuropathy   OSA (obstructive sleep apnea)    06/2011 sleep study: moderate OSA, CPAP at 12 cm H2O.   Osteoarthritis    Bilat thumb carpometacarpal joints.  Ortho injected each thumb x 2 in 2017.  Also injected 03/14/17.   Recurrent pneumonia    Shortness of breath dyspnea    Stroke [facility])    TIA   TIA (transient ischemic attack) 2015/16   with R carotid dz: pt got R carotid stent 01/2014 by Dr. Allyson Carter and was put on dual antiplatelet therapy with ASA 325mg  and plavix 75mg  qd afterwards   TIA (transient ischemic attack) 05/2015   Left brain (right sided numbness + slurred speech)  Admitted for obs/workup 05/2015.   Venous insufficiency    Venous reflux 10/14/2011   venous doppler-R GSV continuous reflux throughout; too small for VNUS closure    Past Surgical History:  Procedure Laterality Date   ABI  01/2016   Normal.  Dr. 12/14/2011 to repeat in 1 yr.   AORTIC VALVE REPLACEMENT N/A 07/09/2015   Bovine pericardial valve.  Procedure: AORTIC VALVE REPLACEMENT (AVR);  Surgeon: [name], MD;  Location: [facility] OR;  Service: Open Heart Surgery;  Laterality: N/A;   CARDIAC CATHETERIZATION  10/2011   EF normal.  Diffuse 3 vessel CAD, no stents placed.  Medical mgmt per [facility].   CARDIAC CATHETERIZATION N/A 06/22/2015   Procedure: Right/Left Heart Cath and Coronary Angiography;  Surgeon: 11/2011, MD;  Location: [facility] INVASIVE CV LAB;  Service: Cardiovascular;  Laterality: N/A;   carotid dopplers  06/30/15   Normal: repeat when clinically indicated per Dr. [facility]   CAROTID STENT INSERTION Right 01/16/2014   Procedure: CAROTID  STENT INSERTION;  Surgeon: [name], MD;  Location: [facility] CATH LAB;  Service: Cardiovascular;  Laterality: Right;   CATARACT EXTRACTION     left   CORONARY ARTERY BYPASS GRAFT N/A 07/09/2015   Procedure: CORONARY ARTERY BYPASS GRAFTING (CABG)TIMES THREE USING LEFT INTERNAL MAMMARY ARTERY AND LEFT SAPHENOUS LEG VEIN HARVESTED ENDOSCOPICALLY;  Surgeon: [facility], MD;  Location: [facility] OR;  Service: Open Heart Surgery;  Laterality: N/A;   EYE SURGERY     Multiple laser surgeries for diabetic retinopathy, also cataract surgery OU.   HARDWARE REMOVAL Right 05/28/2020   Procedure: HARDWARE REMOVAL AND EXCHANGE RIGHT ANKLE;  Surgeon: [facility], MD;  Location: [facility] OR;  Service: Orthopedics;  Laterality: Right;   INTRAOPERATIVE TRANSESOPHAGEAL ECHOCARDIOGRAM N/A 07/09/2015   Procedure: INTRAOPERATIVE TRANSESOPHAGEAL ECHOCARDIOGRAM;  Surgeon: [facility], MD;  Location: [facility] OR;  Service: Open Heart Surgery;  Laterality: N/A;   lasix eye     LEFT AND RIGHT HEART  CATHETERIZATION WITH CORONARY ANGIOGRAM N/A 10/28/2011   Procedure: LEFT AND R0IHT HEART CATHETERIZATION WITH CORONARY ANGIOGRAM;  Surgeon: Lennette Bihari, MD;  Location: [facility] CATH LAB;  Service: Cardiovascular;  Laterality: N/A;   ORIF ANKLE FRACTURE Right 07/23/2019   Procedure: OPEN REDUCTION RIGHT ANKLE DISLOCATION WITH REPAIR, OPEN TREATMENT OF TIBIAL PLAFOND FRACTURE WITH LATERAL MALLEOLUS, OPEN TREATMENT OF SYNDESMOSIS;  Surgeon: Terance Hart, MD;  Location: [facility] OR;  Service: Orthopedics;  Laterality: Right;  LENGTH OF SURGERY: 2.5 HOURS   RETINAL DETACHMENT SURGERY     Right eye   ROTATOR CUFF REPAIR W/ DISTAL CLAVICLE EXCISION Right 03/2016   Arthroscopic ([facility])   THYROID SURGERY  2002   TIBIA IM NAIL INSERTION Right 05/28/2020   Procedure: INTRAMEDULLARY NAIL RIGHT TIBIA;  Surgeon: Terance Hart, MD;  Location: [facility] OR;  Service: Orthopedics;  Laterality: Right;   TRANSTHORACIC ECHOCARDIOGRAM   10/2011;12/2012;01/2015; 08/2015; 07/28/16   Mod AS, mild LVH, EF 60-65%, no RWMA.  01/2015 EF 60-65%, grade I DD, progression of mod/sev AS.  08/2015 normal lv fxn with normal fxning bioprosthetic Ao valve.  07/2016--no change compared to 2017 echo.      Family History  Problem Relation Age of Onset   Cancer Mother        lung cancer   Alcohol abuse Father    Cancer Father        laryngeal cancer   Cancer Brother        oldest brother had lung cancer and melanoma    Social History   Socioeconomic History   Marital status: Married    Spouse name: Not on file   Number of children: Not on file   Years of education: Not on file   Highest education level: Not on file  Occupational History   Not on file  Tobacco Use   Smoking status: Former    Types: Cigarettes    Quit date: 43    Years since quitting: 39.8   Smokeless tobacco: Never  Vaping Use   Vaping Use: Never used  Substance and Sexual Activity   Alcohol use: Not Currently    Alcohol/week: 0.0 standard drinks   Drug use: No   Sexual activity: Not on file  Other Topics Concern   Not on file  Social History Narrative   Divorced x1, remarried.  Has 3 daughters from first marriage.   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   Distant tobacco abuse (35 pack-yr hx), quit in the late 1990s, no ETOH or drugs.   No exercise.   Social Determinants of Health   Financial Resource Strain: Not on file  Food Insecurity: Not on file  Transportation Needs: Not on file  Physical Activity: Not on file  Stress: Not on file  Social Connections: Not on file  Intimate Partner Violence: Not on file    Allergies  Allergen Reactions   Rosuvastatin Calcium Other (See Comments)    Problem with higher dosages (Lipitor caused memory issues), also caused leg pains and lethargy   Statins Other (See Comments)    Problem with higher  dosages (Lipitor caused memory issues), also caused leg pains and lethargy   Tape Rash and Other (See Comments)    Adhesive tape.  (Paper tape OK)    Current Outpatient Medications  Medication Sig Dispense Refill   aspirin EC 81 MG tablet Take 81  mg by mouth daily with lunch.     clopidogrel (PLAVIX) 75 MG tablet TAKE 1 TABLET BY MOUTH EVERY DAY (Patient taking differently: Take 75 mg by mouth daily at 12 noon.) 90 tablet 3   dimenhyDRINATE (DRAMAMINE) 50 MG tablet Take 50 mg by mouth daily with lunch.     Feverfew 380 MG CAPS Take 380 mg by mouth daily with lunch. To prevent migraines     Insulin Human (INSULIN PUMP) SOLN Inject into the skin continuous. Basal rate .95/hr, current pump settings: 12am .7, 2am 1, 7am 1.1, 11am .9, 5pm .9.25, 11pm .85. Uses Humalog U-100 Insulin.     insulin lispro (HUMALOG) 100 UNIT/ML injection Use 60 units via insulin pump. (Patient taking differently: Use via insulin pump.) 90 mL 2   levothyroxine (SYNTHROID) 175 MCG tablet Take 175 mcg by mouth daily before breakfast.     loperamide (IMODIUM A-D) 2 MG tablet Take 2 mg by mouth every other day.     losartan-hydrochlorothiazide (HYZAAR) 100-12.5 MG tablet Take 1 tablet by mouth daily at 12 noon.     Multiple Vitamin (MULTIVITAMIN WITH MINERALS) TABS tablet Take 1 tablet by mouth daily with lunch. Multivitamin for Men 50+     omeprazole (PRILOSEC OTC) 20 MG tablet Take 20 mg by mouth daily with lunch.     REPATHA PUSHTRONEX SYSTEM 420 MG/3.5ML SOCT INJECT 1 DOSE INTO THE SKIN EVERY 30 (THIRTY) DAYS. 10.5 mL 4   Probiotic Product (PROBIOTIC PO) Take 1 capsule by mouth daily with lunch.     rosuvastatin (CRESTOR) 5 MG tablet Take 1 tablet (5 mg total) by mouth daily. 90 tablet 3   No current facility-administered medications for this visit.    REVIEW OF SYSTEMS:  [X]  denotes positive finding, [ ]  denotes negative finding Cardiac  Comments:  Chest pain or chest pressure:    Shortness of breath upon  exertion:    Short of breath when lying flat:    Irregular heart rhythm:        Vascular    Pain in calf, thigh, or hip brought on by ambulation:    Pain in feet at night that wakes you up from your sleep:     Blood clot in your veins:    Leg swelling:  x       Pulmonary    Oxygen at home:    Productive cough:     Wheezing:         Neurologic    Sudden weakness in arms or legs:     Sudden numbness in arms or legs:  x   Sudden onset of difficulty speaking or slurred speech:    Temporary loss of vision in one eye:     Problems with dizziness:         Gastrointestinal    Blood in stool:     Vomited blood:         Genitourinary    Burning when urinating:     Blood in urine:        Psychiatric    Major depression:         Hematologic    Bleeding problems:    Problems with blood clotting too easily:        Skin    Rashes or ulcers:        Constitutional    Fever or chills:      PHYSICAL EXAM Vitals:   10/23/20 1245  BP: 112/72  Pulse: 81  Resp:  20  Temp: 98 F (36.7 C)  SpO2: 97%  Weight: 236 lb (107 kg)  Height: 6\' 2"  (1.88 m)    Constitutional: chronically ill appearing. no distress. Appears well nourished.  Neurologic: CN intact. no focal findings. no sensory loss. Psychiatric:  Mood and affect symmetric and appropriate. Eyes:  No icterus. No conjunctival pallor. Ears, nose, throat:  mucous membranes moist. Midline trachea.  Cardiac: regular rate and rhythm.  Respiratory:  unlabored. Abdominal:  soft, non-tender, non-distended.  Peripheral vascular: 2+ femoral pulses. No palpable pedal pulses. Extremity: No edema. No cyanosis. No pallor.  Skin:     Lymphatic: no Stemmer's sign. no palpable lymphadenopathy.  PERTINENT LABORATORY AND RADIOLOGIC DATA  Most recent CBC CBC Latest Ref Rng & Units 05/27/2020 07/23/2019 07/19/2019  WBC 4.0 - 10.5 K/uL 9.5 - 7.3  Hemoglobin 13.0 - 17.0 g/dL 11.9(L) 11.9(L) 12.3(L)  Hematocrit 39.0 - 52.0 % 35.7(L)  35.0(L) 37.2(L)  Platelets 150 - 400 K/uL 231 - 324     Most recent CMP CMP Latest Ref Rng & Units 05/27/2020 07/23/2019 07/19/2019  Glucose 70 - 99 mg/dL 07/21/2019) 062(B) 762(G)  BUN 8 - 23 mg/dL 315(V) 76(H) 19  Creatinine 0.61 - 1.24 mg/dL 60(V) 3.71(G 6.26  Sodium 135 - 145 mmol/L 134(L) 137 135  Potassium 3.5 - 5.1 mmol/L 5.3(H) 4.7 5.4(H)  Chloride 98 - 111 mmol/L 101 99 101  CO2 22 - 32 mmol/L 27 - 26  Calcium 8.9 - 10.3 mg/dL 9.48) - 9.0  Total Protein 6.0 - 8.5 g/dL - - -  Total Bilirubin 0.0 - 1.2 mg/dL - - -  Alkaline Phos 39 - 117 IU/L - - -  AST 0 - 40 IU/L - - -  ALT 0 - 44 IU/L - - -    Renal function CrCl cannot be calculated (Patient's most recent lab result is older than the maximum 21 days allowed.).  Hgb A1c MFr Bld (%)  Date Value  05/27/2020 7.6 (H)    LDL Chol Calc (NIH)  Date Value Ref Range Status  06/16/2020 52 0 - 99 mg/dL Final   Direct LDL  Date Value Ref Range Status  04/21/2011 159.5 mg/dL Final    Comment:    Optimal:  <100 mg/dLNear or Above Optimal:  100-129 mg/dLBorderline High:  130-159 mg/dLHigh:  160-189 mg/dLVery High:  >190 mg/dL     Vascular Imaging:  +-------+-----------+-----------+------------+------------+  ABI/TBIToday's ABIToday's TBIPrevious ABIPrevious TBI  +-------+-----------+-----------+------------+------------+  Right  0.77       0.36       1.15        0.91          +-------+-----------+-----------+------------+------------+  Left   0.99       0.57       1.12        1.02          +-------+-----------+-----------+------------+------------+   04/23/2011. Rande Brunt, MD [facility] Phone Number: [phone] 10/23/2020 4:04 PM  Total time spent on preparing this encounter including chart review, data review, collecting history, examining the patient, coordinating care for this new patient, 60 minutes.  Portions of this report may have been transcribed using voice  recognition software.  Every effort has been made to ensure accuracy; however, inadvertent computerized transcription errors may still be present.

## 2020-10-23 ENCOUNTER — Ambulatory Visit (HOSPITAL_COMMUNITY)
Admission: RE | Admit: 2020-10-23 | Discharge: 2020-10-23 | Disposition: A | Discharge status: Home / Self Care | Payer: HMO | Source: Ambulatory Visit | Attending: Vascular Surgery | Admitting: Vascular Surgery

## 2020-10-23 ENCOUNTER — Other Ambulatory Visit: Payer: Self-pay

## 2020-10-23 ENCOUNTER — Ambulatory Visit: Payer: HMO | Admitting: Vascular Surgery

## 2020-10-23 ENCOUNTER — Encounter: Payer: Self-pay | Admitting: Vascular Surgery

## 2020-10-23 VITALS — BP 112/72 | HR 81 | Temp 98.0°F | Resp 20 | Ht 74.0 in | Wt 236.0 lb

## 2020-10-23 DIAGNOSIS — I70234 Atherosclerosis of native arteries of right leg with ulceration of heel and midfoot: Secondary | ICD-10-CM

## 2020-10-23 DIAGNOSIS — I739 Peripheral vascular disease, unspecified: Secondary | ICD-10-CM | POA: Diagnosis present

## 2020-10-27 ENCOUNTER — Encounter (HOSPITAL_COMMUNITY): Payer: Self-pay | Admitting: Vascular Surgery

## 2020-10-27 ENCOUNTER — Ambulatory Visit (HOSPITAL_COMMUNITY)
Admission: RE | Admit: 2020-10-27 | Discharge: 2020-10-27 | Disposition: A | Discharge status: Home / Self Care | Payer: HMO | Attending: Surgery | Admitting: Surgery

## 2020-10-27 ENCOUNTER — Encounter (HOSPITAL_COMMUNITY)
Admission: RE | Disposition: A | Discharge status: Home / Self Care | Payer: Self-pay | Source: Home / Self Care | Attending: Surgery

## 2020-10-27 DIAGNOSIS — L97519 Non-pressure chronic ulcer of other part of right foot with unspecified severity: Secondary | ICD-10-CM | POA: Insufficient documentation

## 2020-10-27 DIAGNOSIS — Z87891 Personal history of nicotine dependence: Secondary | ICD-10-CM | POA: Insufficient documentation

## 2020-10-27 DIAGNOSIS — E11621 Type 2 diabetes mellitus with foot ulcer: Secondary | ICD-10-CM | POA: Insufficient documentation

## 2020-10-27 DIAGNOSIS — Z91048 Other nonmedicinal substance allergy status: Secondary | ICD-10-CM | POA: Diagnosis not present

## 2020-10-27 DIAGNOSIS — L97419 Non-pressure chronic ulcer of right heel and midfoot with unspecified severity: Secondary | ICD-10-CM

## 2020-10-27 DIAGNOSIS — I70222 Atherosclerosis of native arteries of extremities with rest pain, left leg: Secondary | ICD-10-CM

## 2020-10-27 DIAGNOSIS — I70235 Atherosclerosis of native arteries of right leg with ulceration of other part of foot: Secondary | ICD-10-CM | POA: Diagnosis not present

## 2020-10-27 DIAGNOSIS — Z888 Allergy status to other drugs, medicaments and biological substances status: Secondary | ICD-10-CM | POA: Insufficient documentation

## 2020-10-27 DIAGNOSIS — I70239 Atherosclerosis of native arteries of right leg with ulceration of unspecified site: Secondary | ICD-10-CM

## 2020-10-27 DIAGNOSIS — I70234 Atherosclerosis of native arteries of right leg with ulceration of heel and midfoot: Secondary | ICD-10-CM

## 2020-10-27 DIAGNOSIS — E1151 Type 2 diabetes mellitus with diabetic peripheral angiopathy without gangrene: Secondary | ICD-10-CM | POA: Diagnosis not present

## 2020-10-27 DIAGNOSIS — L97919 Non-pressure chronic ulcer of unspecified part of right lower leg with unspecified severity: Secondary | ICD-10-CM

## 2020-10-27 HISTORY — PX: ABDOMINAL AORTOGRAM W/LOWER EXTREMITY: CATH118223

## 2020-10-27 LAB — POCT I-STAT, CHEM 8
BUN: 30 mg/dL — ABNORMAL HIGH (ref 8–23)
Calcium, Ion: 1.22 mmol/L (ref 1.15–1.40)
Chloride: 103 mmol/L (ref 98–111)
Creatinine, Ser: 1.1 mg/dL (ref 0.61–1.24)
Glucose, Bld: 249 mg/dL — ABNORMAL HIGH (ref 70–99)
HCT: 37 % — ABNORMAL LOW (ref 39.0–52.0)
Hemoglobin: 12.6 g/dL — ABNORMAL LOW (ref 13.0–17.0)
Potassium: 5.1 mmol/L (ref 3.5–5.1)
Sodium: 138 mmol/L (ref 135–145)
TCO2: 25 mmol/L (ref 22–32)

## 2020-10-27 SURGERY — ABDOMINAL AORTOGRAM W/LOWER EXTREMITY
Anesthesia: LOCAL

## 2020-10-27 MED ORDER — HEPARIN SODIUM (PORCINE) 1000 UNIT/ML IJ SOLN
INTRAMUSCULAR | Status: DC | PRN
  Administered 2020-10-27: 8000 [IU] via INTRAVENOUS

## 2020-10-27 MED ORDER — MIDAZOLAM HCL 2 MG/2ML IJ SOLN
INTRAMUSCULAR | Status: DC | PRN
  Administered 2020-10-27: 1 mg via INTRAVENOUS

## 2020-10-27 MED ORDER — PROTAMINE SULFATE 10 MG/ML IV SOLN
INTRAVENOUS | Status: DC | PRN
  Administered 2020-10-27: 5 mg via INTRAVENOUS
  Administered 2020-10-27: 25 mg via INTRAVENOUS

## 2020-10-27 MED ORDER — LIDOCAINE HCL (PF) 1 % IJ SOLN
INTRAMUSCULAR | Status: DC | PRN
  Administered 2020-10-27: 18 mL via INTRADERMAL

## 2020-10-27 MED ORDER — HEPARIN (PORCINE) IN NACL 1000-0.9 UT/500ML-% IV SOLN
INTRAVENOUS | Status: AC
  Filled 2020-10-27: qty 1000

## 2020-10-27 MED ORDER — MIDAZOLAM HCL 2 MG/2ML IJ SOLN
INTRAMUSCULAR | Status: AC
  Filled 2020-10-27: qty 2

## 2020-10-27 MED ORDER — SODIUM CHLORIDE 0.9 % IV SOLN: INTRAVENOUS | Status: DC

## 2020-10-27 MED ORDER — HEPARIN SODIUM (PORCINE) 1000 UNIT/ML IJ SOLN
INTRAMUSCULAR | Status: AC
  Filled 2020-10-27: qty 1

## 2020-10-27 MED ORDER — FENTANYL CITRATE (PF) 100 MCG/2ML IJ SOLN
INTRAMUSCULAR | Status: DC | PRN
  Administered 2020-10-27 (×2): 50 ug via INTRAVENOUS

## 2020-10-27 MED ORDER — FENTANYL CITRATE (PF) 100 MCG/2ML IJ SOLN
INTRAMUSCULAR | Status: AC
  Filled 2020-10-27: qty 2

## 2020-10-27 MED ORDER — HEPARIN (PORCINE) IN NACL 1000-0.9 UT/500ML-% IV SOLN
INTRAVENOUS | Status: DC | PRN
  Administered 2020-10-27 (×2): 500 mL

## 2020-10-27 MED ORDER — PROTAMINE SULFATE 10 MG/ML IV SOLN
INTRAVENOUS | Status: AC
  Filled 2020-10-27: qty 5

## 2020-10-27 MED ORDER — IODIXANOL 320 MG/ML IV SOLN
INTRAVENOUS | Status: DC | PRN
  Administered 2020-10-27: 165 mL via INTRA_ARTERIAL

## 2020-10-27 MED ORDER — LIDOCAINE HCL (PF) 1 % IJ SOLN
INTRAMUSCULAR | Status: AC
  Filled 2020-10-27: qty 30

## 2020-10-27 SURGICAL SUPPLY — 16 items
CATH OMNI FLUSH 5F 65CM (CATHETERS) ×2 IMPLANT
CATH QUICKCROSS .018X135CM (MICROCATHETER) ×2 IMPLANT
CATH QUICKCROSS .035X135CM (MICROCATHETER) ×2 IMPLANT
CLOSURE PERCLOSE PROSTYLE (VASCULAR PRODUCTS) ×6 IMPLANT
DEVICE TORQUE .025-.038 (MISCELLANEOUS) ×2 IMPLANT
GLIDEWIRE ADV .035X260CM (WIRE) ×2 IMPLANT
KIT MICROPUNCTURE NIT STIFF (SHEATH) ×2 IMPLANT
KIT PV (KITS) ×2 IMPLANT
SHEATH HIGHFLEX ANSEL 6FRX55 (SHEATH) ×2 IMPLANT
SHEATH PINNACLE 5F 10CM (SHEATH) ×2 IMPLANT
SHEATH PROBE COVER 6X72 (BAG) ×2 IMPLANT
SYR MEDRAD MARK 7 150ML (SYRINGE) ×2 IMPLANT
TRANSDUCER W/STOPCOCK (MISCELLANEOUS) ×2 IMPLANT
TRAY PV CATH (CUSTOM PROCEDURE TRAY) ×2 IMPLANT
WIRE BENTSON .035X145CM (WIRE) ×2 IMPLANT
WIRE G V18X300CM (WIRE) ×4 IMPLANT

## 2020-10-27 NOTE — Progress Notes (Deleted)
[facility] OF [place]  Pt seen and examined this morning in cath lab holding for RLE angiogram with possible intervention.  He was seen by my partner Dr. Lenell Antu in clinic and diagnosed with Rutherford 5 critical limb ischemia.  Currently has a non-healing ulceration on the right foot. No changes in physical exam since seen in clinic. After discussing the risks and benefits of RLE angiography with possible intervention, [name] elected to proceed.   Nathaniel Sparrow MD   ASSESSMENT / PLAN: Dr. [name] pt [name] is a 70 y.o. 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  Patient counseled patients with chronic limb threatening ischemia have an annual risk of cardiovascular mortality of 25% and a high risk of amputation.   WIfI score calculated based on clinical exam and non-invasive measurements. Stage 3: expected 25% risk of major amputation within the year; revascularization likely to reduce risk of amputation.  Recommend the following which can slow the progression of atherosclerosis and reduce the risk of major adverse cardiac / limb events:  Complete cessation from all tobacco products. Blood glucose control with goal A1c < 7%. Blood pressure control with goal blood pressure < 140/90 mmHg. Lipid reduction therapy with goal LDL-C <100 mg/dL (<20 if symptomatic from PAD).  Aspirin 81mg  PO QD.  Atorvastatin 40-80mg  PO QD (or other "high intensity" statin therapy).  Discussed with Dr. , he does not have time to do an angiogram for him until 11/02/2020.  Plan right lower extremity angiogram with possible intervention via left common femoral artery approach in cath lab next week with me or one of my partners.   CHIEF COMPLAINT: right foot ulcer  HISTORY OF PRESENT ILLNESS: [name] is a 70 y.o. male who presents to clinic for evaluation of peripheral arterial disease. He underwent intramedullary nail of  the right tibia and removal of deep orthopedic hardware on 05/28/2020.  He developed an ulcer on his posterior heel shortly thereafter.  The ulcer has been slow to heal since that time.  The patient has also noticed a new wound develop on his right great toe.  Prior to his orthopedic difficulty, the patient reports a fairly classic history of claudication.  He has neuropathy in does not have much sensation in his feet.  He has an extensive past cardiovascular history.  He has been taken care of by Dr. 05/30/2020 in the past.  Past Medical History:  Diagnosis Date   Abnormal stress test 10/27/2011   lat isch--cardiac cath 10/28/2011   Aortic stenosis, moderate Sept/Oct /2013   2017-> s/p bioprosthetic AV [facility] Ease bovine pericardial valve model 3300 TFX, ser # [facility]).   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 Inferolateral perfusion defect + EKG abnormality during stress testing prompted Cath by [facility]: 3V CAD, EF normal.  Med mgmt recommended; b. 06/2015 Cath: 3VD and mod AS; c. 07/2015 CABG x 3 (LIMA->LAD, VG->OM2, VG->PDA) w/ AVR.   Carotid stenosis, right 09/2011   Dr. 10/2011 did R carotid stenting 01/2014.  Carotid dopplers 06/2015 essentially normal.  No change 07/2016--repeat 1 yr.   Complication of anesthesia    difficulty with intubation,    Decreased pedal pulses    LE doppler 11/08/11- no evidence of arterial insufficiency   Diabetes mellitus Dx'd age 64   Novolog via insulin pump; sub-optimal control x years   Diabetic neuropathy (HCC)    fine touch and position sense affected   Diabetic retinopathy  Proliferative: Hx of retinal detachment on right-light perception only in right eye   Diastolic dysfunction 2007; 2016   TEE with diastolic dysfunction, EF 74%.   Difficult intubation    ~ 2002 difficult fiberoptic intubation with takeback bleeding s/p thyroidectomy;  for thyroidectomy was intubated DL X 1 with cricoid pressure but difficult mask (full beard)   Dizziness    feels off  balance   Erectile dysfunction    [place]rolog[place]   GERD (gastroesophageal reflux disease)    Headache    resolved   Heart murmur    History of tobacco abuse    Quit about 1990 (has 35 pack-yr hx)   Hyperlipemia, mixed    a. intolerant of statins/zeta; per Dr. [name] 12/2016, pt started on PCSK9--incomplete responder?---01/2017.   Hypothyroidism, postsurgical    Thyroidectomy 2002; multinodular goiter.  Dr. Elvera Lennox managing this as of 10/2015.   Impaired vision    right eye light perception only; hx of retinal detachment.   Impingement syndrome of right shoulder    08/26/15 Pt got subacromial steroid injection by orthopedist (Dr. Delfin Edis).  [facility] f/u 01/2016--MRI showed RC tear + AC joint arthritis, arthroscopic surgery done 03/2016 ([facility]).   Neuromuscular disorder (HCC)    neuropathy   OSA (obstructive sleep apnea)    06/2011 sleep study: moderate OSA, CPAP at 12 cm H2O.   Osteoarthritis    Bilat thumb carpometacarpal joints.  Ortho injected each thumb x 2 in 2017.  Also injected 03/14/17.   Recurrent pneumonia    Shortness of breath dyspnea    Stroke [facility])    TIA   TIA (transient ischemic attack) 2015/16   with R carotid dz: pt got R carotid stent 01/2014 by Dr. [name] and was put on dual antiplatelet therapy with ASA 325mg  and plavix 75mg  qd afterwards   TIA (transient ischemic attack) 05/2015   Left brain (right sided numbness + slurred speech)  Admitted for obs/workup 05/2015.   Venous insufficiency    Venous reflux 10/14/2011   venous doppler-R GSV continuous reflux throughout; too small for VNUS closure    Past Surgical History:  Procedure Laterality Date   ABI  01/2016   Normal.  Dr. 12/14/2011 to repeat in 1 yr.   AORTIC VALVE REPLACEMENT N/A 07/09/2015   Bovine pericardial valve.  Procedure: AORTIC VALVE REPLACEMENT (AVR);  Surgeon: [name], MD;  [facility];  Service: Open Heart Surgery;  Laterality: N/A;   CARDIAC CATHETERIZATION   10/2011   EF normal.  Diffuse 3 vessel CAD, no stents placed.  Medical mgmt per [facility].   CARDIAC CATHETERIZATION N/A 06/22/2015   Procedure: Right/Left Heart Cath and Coronary Angiography;  Surgeon: 11/2011, MD;  Location: [facility] INVASIVE CV LAB;  Service: Cardiovascular;  Laterality: N/A;   carotid dopplers  06/30/15   Normal: repeat when clinically indicated per Dr. [facility]   CAROTID STENT INSERTION Right 01/16/2014   Procedure: CAROTID STENT INSERTION;  Surgeon: [name], MD;  Location: [facility] CATH LAB;  Service: Cardiovascular;  Laterality: Right;   CATARACT EXTRACTION     left   CORONARY ARTERY BYPASS GRAFT N/A 07/09/2015   Procedure: CORONARY ARTERY BYPASS GRAFTING (CABG)TIMES THREE USING LEFT INTERNAL MAMMARY ARTERY AND LEFT SAPHENOUS LEG VEIN HARVESTED ENDOSCOPICALLY;  Surgeon: [facility], MD;  Location: [facility] OR;  Service: Open Heart Surgery;  Laterality: N/A;   EYE SURGERY     Multiple laser surgeries for diabetic retinopathy, also cataract surgery  OU.   HARDWARE REMOVAL Right 05/28/2020   Procedure: HARDWARE REMOVAL AND EXCHANGE RIGHT ANKLE;  Surgeon: Terance Hart, MD;  Location: [facility] OR;  Service: Orthopedics;  Laterality: Right;   INTRAOPERATIVE TRANSESOPHAGEAL ECHOCARDIOGRAM N/A 07/09/2015   Procedure: INTRAOPERATIVE TRANSESOPHAGEAL ECHOCARDIOGRAM;  Surgeon: Loreli Slot, MD;  Location: [facility] OR;  Service: Open Heart Surgery;  Laterality: N/A;   lasix eye     LEFT AND RIGHT HEART CATHETERIZATION WITH CORONARY ANGIOGRAM N/A 10/28/2011   Procedure: LEFT AND R0IHT HEART CATHETERIZATION WITH CORONARY ANGIOGRAM;  Surgeon: Lennette Bihari, MD;  Location: [facility] CATH LAB;  Service: Cardiovascular;  Laterality: N/A;   ORIF ANKLE FRACTURE Right 07/23/2019   Procedure: OPEN REDUCTION RIGHT ANKLE DISLOCATION WITH REPAIR, OPEN TREATMENT OF TIBIAL PLAFOND FRACTURE WITH LATERAL MALLEOLUS, OPEN TREATMENT OF SYNDESMOSIS;  Surgeon: Terance Hart, MD;  Location: [facility] OR;  Service:  Orthopedics;  Laterality: Right;  LENGTH OF SURGERY: 2.5 HOURS   RETINAL DETACHMENT SURGERY     Right eye   ROTATOR CUFF REPAIR W/ DISTAL CLAVICLE EXCISION Right 03/2016   Arthroscopic ([facility])   THYROID SURGERY  2002   TIBIA IM NAIL INSERTION Right 05/28/2020   Procedure: INTRAMEDULLARY NAIL RIGHT TIBIA;  Surgeon: Terance Hart, MD;  Location: [facility] OR;  Service: Orthopedics;  Laterality: Right;   TRANSTHORACIC ECHOCARDIOGRAM  10/2011;12/2012;01/2015; 08/2015; 07/28/16   Mod AS, mild LVH, EF 60-65%, no RWMA.  01/2015 EF 60-65%, grade I DD, progression of mod/sev AS.  08/2015 normal lv fxn with normal fxning bioprosthetic Ao valve.  07/2016--no change compared to 2017 echo.      Family History  Problem Relation Age of Onset   Cancer Mother        lung cancer   Alcohol abuse Father    Cancer Father        laryngeal cancer   Cancer Brother        oldest brother had lung cancer and melanoma    Social History   Socioeconomic History   Marital status: Married    Spouse name: Not on file   Number of children: Not on file   Years of education: Not on file   Highest education level: Not on file  Occupational History   Not on file  Tobacco Use   Smoking status: Former    Types: Cigarettes    Quit date: 60    Years since quitting: 39.8   Smokeless tobacco: Never  Vaping Use   Vaping Use: Never used  Substance and Sexual Activity   Alcohol use: Not Currently    Alcohol/week: 0.0 standard drinks   Drug use: No   Sexual activity: Not on file  Other Topics Concern   Not on file  Social History Narrative   Divorced x1, remarried.  Has 3 daughters from first marriage.   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   Distant tobacco abuse (35 pack-yr hx), quit in the late 1990s, no ETOH or drugs.   No exercise.   Social Determinants of Health   Financial Resource  Strain: Not on file  Food Insecurity: Not on file  Transportation Needs: Not on file  Physical Activity: Not on file  Stress: Not on file  Social Connections: Not on file  Intimate Partner Violence: Not on file    Allergies  Allergen Reactions   Rosuvastatin Calcium Other (See Comments)  Problem with higher dosages (Lipitor caused memory issues), also caused leg pains and lethargy   Statins Other (See Comments)    Problem with higher dosages (Lipitor caused memory issues), also caused leg pains and lethargy   Tape Rash and Other (See Comments)    Adhesive tape.  (Paper tape OK)    Current Facility-Administered Medications  Medication Dose Route Frequency Provider Last Rate Last Admin   0.9 %  sodium chloride infusion   Intravenous Continuous Brabham, Fran Lowes, MD        REVIEW OF SYSTEMS:  [X]  denotes positive finding, [ ]  denotes negative finding Cardiac  Comments:  Chest pain or chest pressure:    Shortness of breath upon exertion:    Short of breath when lying flat:    Irregular heart rhythm:        Vascular    Pain in calf, thigh, or hip brought on by ambulation:    Pain in feet at night that wakes you up from your sleep:     Blood clot in your veins:    Leg swelling:  x       Pulmonary    Oxygen at home:    Productive cough:     Wheezing:         Neurologic    Sudden weakness in arms or legs:     Sudden numbness in arms or legs:  x   Sudden onset of difficulty speaking or slurred speech:    Temporary loss of vision in one eye:     Problems with dizziness:         Gastrointestinal    Blood in stool:     Vomited blood:         Genitourinary    Burning when urinating:     Blood in urine:        Psychiatric    Major depression:         Hematologic    Bleeding problems:    Problems with blood clotting too easily:        Skin    Rashes or ulcers:        Constitutional    Fever or chills:      PHYSICAL EXAM Vitals:   10/27/20 0854  BP: (!)  158/68  Pulse: 95  Resp: 12  Temp: 97.8 F (36.6 C)  TempSrc: Oral  SpO2: 98%  Weight: 106.6 kg  Height: 6\' 2"  (1.88 m)    Constitutional: chronically ill appearing. no distress. Appears well nourished.  Neurologic: CN intact. no focal findings. no sensory loss. Psychiatric:  Mood and affect symmetric and appropriate. Eyes:  No icterus. No conjunctival pallor. Ears, nose, throat:  mucous membranes moist. Midline trachea.  Cardiac: regular rate and rhythm.  Respiratory:  unlabored. Abdominal:  soft, non-tender, non-distended.  Peripheral vascular: 2+ femoral pulses. No palpable pedal pulses. Extremity: No edema. No cyanosis. No pallor.  Skin:     Lymphatic: no Stemmer's sign. no palpable lymphadenopathy.  PERTINENT LABORATORY AND RADIOLOGIC DATA  Most recent CBC CBC Latest Ref Rng & Units 05/27/2020 07/23/2019 07/19/2019  WBC 4.0 - 10.5 K/uL 9.5 - 7.3  Hemoglobin 13.0 - 17.0 g/dL 11.9(L) 11.9(L) 12.3(L)  Hematocrit 39.0 - 52.0 % 35.7(L) 35.0(L) 37.2(L)  Platelets 150 - 400 K/uL 231 - 324     Most recent CMP CMP Latest Ref Rng & Units 05/27/2020 07/23/2019 07/19/2019  Glucose 70 - 99 mg/dL 05/29/2020) 07/25/2019) 07/21/2019)  BUN 8 - 23 mg/dL 086(V)  24(H) 19  Creatinine 0.61 - 1.24 mg/dL 0.88(P) 1.03 1.59  Sodium 135 - 145 mmol/L 134(L) 137 135  Potassium 3.5 - 5.1 mmol/L 5.3(H) 4.7 5.4(H)  Chloride 98 - 111 mmol/L 101 99 101  CO2 22 - 32 mmol/L 27 - 26  Calcium 8.9 - 10.3 mg/dL 4.5(O) - 9.0  Total Protein 6.0 - 8.5 g/dL - - -  Total Bilirubin 0.0 - 1.2 mg/dL - - -  Alkaline Phos 39 - 117 IU/L - - -  AST 0 - 40 IU/L - - -  ALT 0 - 44 IU/L - - -    Renal function CrCl cannot be calculated (Patient's most recent lab result is older than the maximum 21 days allowed.).  Hgb A1c MFr Bld (%)  Date Value  05/27/2020 7.6 (H)    LDL Chol Calc (NIH)  Date Value Ref Range Status  06/16/2020 52 0 - 99 mg/dL Final   Direct LDL  Date Value Ref Range Status  04/21/2011 159.5 mg/dL Final     Comment:    Optimal:  <100 mg/dLNear or Above Optimal:  100-129 mg/dLBorderline High:  130-159 mg/dLHigh:  160-189 mg/dLVery High:  >190 mg/dL     Vascular Imaging:  +-------+-----------+-----------+------------+------------+  ABI/TBIToday's ABIToday's TBIPrevious ABIPrevious TBI  +-------+-----------+-----------+------------+------------+  Right  0.77       0.36       1.15        0.91          +-------+-----------+-----------+------------+------------+  Left   0.99       0.57       1.12        1.02          +-------+-----------+-----------+------------+------------+   Rande Brunt. Lenell Antu, MD [facility] Special[facility] of [facility] Phone Number: [phone] 10/27/2020 9:09 AM  Total time spent on preparing this encounter including chart review, data review, collecting history, examining the patient, coordinating care for this new patient, 60 minutes.  Portions of this report may have been transcribed using voice recognition software.  Every effort has been made to ensure accuracy; however, inadvertent computerized transcription errors may still be present.

## 2020-10-27 NOTE — Op Note (Addendum)
Patient name: [name] MRN: [id number] DOB: 1950/05/13 Sex: male  10/27/2020 Pre-operative Diagnosis: Right lower extremity Rutherford 5 critical limb ischemia Post-operative diagnosis:  Same Surgeon:  [name], MD Procedure Performed: 1.  Ultrasound-guided micropuncture access of the left common femoral artery 2.  Aortogram 3.  Bilateral lower extremity angiogram 4.  Second order cannulation, right lower extremity angiogram 5.  Third order cannulation, right lower extremity angiogram 6.  Tibial cannulation, right pedal angiogram 7.  Device assisted closure-Pro-glide 8.  Moderate sedation 71 minutes   Indications:  [name] is a 70 y.o. 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 Patient counseled patients with chronic limb threatening ischemia have an annual risk of cardiovascular mortality of 25% and a high risk of amputation.  WIfI score calculated based on clinical exam and non-invasive measurements. Stage 3: expected 25% risk of major amputation within the year; revascularization likely to reduce risk of amputation.  Findings:  Aortogram: Normal bilateral renal arteries, normal infrarenal abdominal aorta.  On the right: Normal common iliac artery, normal external iliac artery, normal internal iliac artery, normal common femoral, normal SFA, normal profunda, there was a small nonflow limiting lesion in the P2 segment of the popliteal artery.  Large collateral in the P2 segment.  Normal P3 segment of popliteal artery, no anterior tibial artery, normal tibioperoneal trunk, the posterior tibial artery occluded 2 cm after its takeoff.  Inline flow to the ankle via the peroneal artery.  Diffuse collateralization with throughout the calf and foot.  It appears the dorsalis pedis reconstitutes in the foot the posterior tibial artery does not reconstitute.  On the left: Normal common iliac artery, normal external iliac  artery, normal internal iliac artery, normal common femoral, normal superficial femoral artery, normal profunda.  Multiple, flow-limiting, tandem stenoses within the popliteal artery.  There is a tibial trifurcation with ostial stenosis of the anterior tibial artery.  This occludes 3 cm from its takeoff.  The posterior tibial artery and peroneal arteries provide inline flow to the foot.  No flow-limiting stenosis appreciated within them.   Procedure:  The patient was identified in the holding area and taken to room 8.  The patient was then placed supine on the table and prepped and draped in the usual sterile fashion.  A time out was called.  Ultrasound was used to evaluate the left common femoral artery.  It was patent .  A digital ultrasound image was acquired.  A micropuncture needle was used to access the left common femoral artery under ultrasound guidance.  An 018 wire was advanced without resistance and a micropuncture sheath was placed.  The 018 wire was removed and a benson wire was placed.  The micropuncture sheath was exchanged for a 5 french sheath.  An omniflush catheter was advanced over the wire to the level of L-1.  An abdominal angiogram was obtained.  The Omni Flush catheter was brought to the terminal aorta and bilateral lower extremity angiogram followed.  This demonstrated washout in the right lower extremity, and therefore a wire was used to traverse the aortic bifurcation.  Diagnostic right lower extremity angiography occurred from the external iliac artery, again there is washout and therefore the catheter was advanced into the superficial femoral artery for right lower extremity angiogram.   As stated above, the posterior tibial artery occluded 2 cm from its ostia.  I made a decision to try to intervene.  A 6 x 55 cm  sheath was brought onto the field and parked in the right common femoral artery.  Next, an 018 system was used to navigate into the posterior tibial artery.  The wire was  well until the distal portion of the tibia.  An 018 catheter was used for pedal angiogram.  Pedal angiogram demonstrated no posterior tibial artery reconstitution at the level of the foot, therefore, the case was aborted as PT plasty without outflow would have resulted in subsequent rethrombosis.  Prior to removing the wire catheter system I checked a gradient across the popliteal artery as there was a lesion present.  This was nonflow limiting, and therefore the decision was made not to pursue angioplasty or stenting.  Patient was closed using a ProGlide device without issue.  Summary: Single-vessel peroneal runoff to the ankle. Unfortunately, the patient has severe vascular disease in the right leg that is not amenable to open or endovascular treatment therapies.    We will discuss his case with Dr. Lenell Antu and Dr. [name]. Recommend continuing wound care.  Nathaniel Olden, MD [facility] of [place] Office: [phone]

## 2020-10-27 NOTE — Progress Notes (Signed)
Ambulated in hallway no bleeding noted before or after ambulation. Tol well  

## 2020-10-27 NOTE — H&P (Signed)
[facility] [place]  Pt seen and examined this morning in cath lab holding for RLE angiogram with possible intervention.  He was seen by my partner Dr. Lenell Antu in clinic and diagnosed with Rutherford 5 critical limb ischemia.  Currently has a non-healing ulceration on the right foot. No changes in physical exam since seen in clinic. After discussing the risks and benefits of RLE angiography with possible intervention, Jaedan elected to proceed.   [name] MD   ASSESSMENT / PLAN: Dr. [name] pt [name] is a 70 y.o. 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  Patient counseled patients with chronic limb threatening ischemia have an annual risk of cardiovascular mortality of 25% and a high risk of amputation.   WIfI score calculated based on clinical exam and non-invasive measurements. Stage 3: expected 25% risk of major amputation within the year; revascularization likely to reduce risk of amputation.  Recommend the following which can slow the progression of atherosclerosis and reduce the risk of major adverse cardiac / limb events:  Complete cessation from all tobacco products. Blood glucose control with goal A1c < 7%. Blood pressure control with goal blood pressure < 140/90 mmHg. Lipid reduction therapy with goal LDL-C <100 mg/dL (<20 if symptomatic from PAD).  Aspirin 81mg  PO QD.  Atorvastatin 40-80mg  PO QD (or other "high intensity" statin therapy).  Discussed with Dr. , he does not have time to do an angiogram for him until 11/02/2020.  Plan right lower extremity angiogram with possible intervention via left common femoral artery approach in cath lab next week with me or one of my partners.   CHIEF COMPLAINT: right foot ulcer  HISTORY OF PRESENT ILLNESS: [name] is a 70 y.o. male who presents to clinic for evaluation of peripheral arterial disease. He underwent intramedullary nail of  the right tibia and removal of deep orthopedic hardware on 05/28/2020.  He developed an ulcer on his posterior heel shortly thereafter.  The ulcer has been slow to heal since that time.  The patient has also noticed a new wound develop on his right great toe.  Prior to his orthopedic difficulty, the patient reports a fairly classic history of claudication.  He has neuropathy in does not have much sensation in his feet.  He has an extensive past cardiovascular history.  He has been taken care of by Dr. 05/30/2020 in the past.  Past Medical History:  Diagnosis Date   Abnormal stress test 10/27/2011   lat isch--cardiac cath 10/28/2011   Aortic stenosis, moderate Sept/Oct /2013   2017-> s/p bioprosthetic AV [facility] Ease bovine pericardial valve model 3300 TFX, ser # [facility]).   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 Inferolateral perfusion defect + EKG abnormality during stress testing prompted Cath by [facility]: 3V CAD, EF normal.  Med mgmt recommended; b. 06/2015 Cath: 3VD and mod AS; c. 07/2015 CABG x 3 (LIMA->LAD, VG->OM2, VG->PDA) w/ AVR.   Carotid stenosis, right 09/2011   Dr. 10/2011 did R carotid stenting 01/2014.  Carotid dopplers 06/2015 essentially normal.  No change 07/2016--repeat 1 yr.   Complication of anesthesia    difficulty with intubation,    Decreased pedal pulses    LE doppler 11/08/11- no evidence of arterial insufficiency   Diabetes mellitus Dx'd age 64   Novolog via insulin pump; sub-optimal control x years   Diabetic neuropathy (HCC)    fine touch and position sense affected   Diabetic retinopathy  Proliferative: Hx of retinal detachment on right-light perception only in right eye   Diastolic dysfunction 2007; 2016   TEE with diastolic dysfunction, EF 74%.   Difficult intubation    ~ 2002 difficult fiberoptic intubation with takeback bleeding s/p thyroidectomy;  for thyroidectomy was intubated DL X 1 with cricoid pressure but difficult mask (full beard)   Dizziness    feels off  balance   Erectile dysfunction    [place]   GERD (gastroesophageal reflux disease)    Headache    resolved   Heart murmur    History of tobacco abuse    Quit about 1990 (has 35 pack-yr hx)   Hyperlipemia, mixed    a. intolerant of statins/zeta; per Dr. [name] 12/2016, pt started on PCSK9--incomplete responder?---01/2017.   Hypothyroidism, postsurgical    Thyroidectomy 2002; multinodular goiter.  Dr. Elvera Lennox managing this as of 10/2015.   Impaired vision    right eye light perception only; hx of retinal detachment.   Impingement syndrome of right shoulder    08/26/15 Pt got subacromial steroid injection by orthopedist (Dr. Delfin Edis).  [facility] f/u 01/2016--MRI showed RC tear + AC joint arthritis, arthroscopic surgery done 03/2016 ([facility]).   Neuromuscular disorder (HCC)    neuropathy   OSA (obstructive sleep apnea)    06/2011 sleep study: moderate OSA, CPAP at 12 cm H2O.   Osteoarthritis    Bilat thumb carpometacarpal joints.  Ortho injected each thumb x 2 in 2017.  Also injected 03/14/17.   Recurrent pneumonia    Shortness of breath dyspnea    Stroke [facility])    TIA   TIA (transient ischemic attack) 2015/16   with R carotid dz: pt got R carotid stent 01/2014 by Dr. [name] and was put on dual antiplatelet therapy with ASA 325mg  and plavix 75mg  qd afterwards   TIA (transient ischemic attack) 05/2015   Left brain (right sided numbness + slurred speech)  Admitted for obs/workup 05/2015.   Venous insufficiency    Venous reflux 10/14/2011   venous doppler-R GSV continuous reflux throughout; too small for VNUS closure    Past Surgical History:  Procedure Laterality Date   ABI  01/2016   Normal.  Dr. 12/14/2011 to repeat in 1 yr.   AORTIC VALVE REPLACEMENT N/A 07/09/2015   Bovine pericardial valve.  Procedure: AORTIC VALVE REPLACEMENT (AVR);  Surgeon: [name], MD;  [facility];  Service: Open Heart Surgery;  Laterality: N/A;   CARDIAC CATHETERIZATION   10/2011   EF normal.  Diffuse 3 vessel CAD, no stents placed.  Medical mgmt per [facility].   CARDIAC CATHETERIZATION N/A 06/22/2015   Procedure: Right/Left Heart Cath and Coronary Angiography;  Surgeon: 11/2011, MD;  Location: [facility] INVASIVE CV LAB;  Service: Cardiovascular;  Laterality: N/A;   carotid dopplers  06/30/15   Normal: repeat when clinically indicated per Dr. [facility]   CAROTID STENT INSERTION Right 01/16/2014   Procedure: CAROTID STENT INSERTION;  Surgeon: [name], MD;  Location: [facility] CATH LAB;  Service: Cardiovascular;  Laterality: Right;   CATARACT EXTRACTION     left   CORONARY ARTERY BYPASS GRAFT N/A 07/09/2015   Procedure: CORONARY ARTERY BYPASS GRAFTING (CABG)TIMES THREE USING LEFT INTERNAL MAMMARY ARTERY AND LEFT SAPHENOUS LEG VEIN HARVESTED ENDOSCOPICALLY;  Surgeon: [facility], MD;  Location: [facility] OR;  Service: Open Heart Surgery;  Laterality: N/A;   EYE SURGERY     Multiple laser surgeries for diabetic retinopathy, also cataract surgery  OU.   HARDWARE REMOVAL Right 05/28/2020   Procedure: HARDWARE REMOVAL AND EXCHANGE RIGHT ANKLE;  Surgeon: Terance Hart, MD;  Location: [facility] OR;  Service: Orthopedics;  Laterality: Right;   INTRAOPERATIVE TRANSESOPHAGEAL ECHOCARDIOGRAM N/A 07/09/2015   Procedure: INTRAOPERATIVE TRANSESOPHAGEAL ECHOCARDIOGRAM;  Surgeon: Loreli Slot, MD;  Location: [facility] OR;  Service: Open Heart Surgery;  Laterality: N/A;   lasix eye     LEFT AND RIGHT HEART CATHETERIZATION WITH CORONARY ANGIOGRAM N/A 10/28/2011   Procedure: LEFT AND R0IHT HEART CATHETERIZATION WITH CORONARY ANGIOGRAM;  Surgeon: Lennette Bihari, MD;  Location: [facility] CATH LAB;  Service: Cardiovascular;  Laterality: N/A;   ORIF ANKLE FRACTURE Right 07/23/2019   Procedure: OPEN REDUCTION RIGHT ANKLE DISLOCATION WITH REPAIR, OPEN TREATMENT OF TIBIAL PLAFOND FRACTURE WITH LATERAL MALLEOLUS, OPEN TREATMENT OF SYNDESMOSIS;  Surgeon: Terance Hart, MD;  Location: [facility] OR;  Service:  Orthopedics;  Laterality: Right;  LENGTH OF SURGERY: 2.5 HOURS   RETINAL DETACHMENT SURGERY     Right eye   ROTATOR CUFF REPAIR W/ DISTAL CLAVICLE EXCISION Right 03/2016   Arthroscopic ([facility])   THYROID SURGERY  2002   TIBIA IM NAIL INSERTION Right 05/28/2020   Procedure: INTRAMEDULLARY NAIL RIGHT TIBIA;  Surgeon: Terance Hart, MD;  Location: [facility] OR;  Service: Orthopedics;  Laterality: Right;   TRANSTHORACIC ECHOCARDIOGRAM  10/2011;12/2012;01/2015; 08/2015; 07/28/16   Mod AS, mild LVH, EF 60-65%, no RWMA.  01/2015 EF 60-65%, grade I DD, progression of mod/sev AS.  08/2015 normal lv fxn with normal fxning bioprosthetic Ao valve.  07/2016--no change compared to 2017 echo.      Family History  Problem Relation Age of Onset   Cancer Mother        lung cancer   Alcohol abuse Father    Cancer Father        laryngeal cancer   Cancer Brother        oldest brother had lung cancer and melanoma    Social History   Socioeconomic History   Marital status: Married    Spouse name: Not on file   Number of children: Not on file   Years of education: Not on file   Highest education level: Not on file  Occupational History   Not on file  Tobacco Use   Smoking status: Former    Types: Cigarettes    Quit date: 60    Years since quitting: 39.8   Smokeless tobacco: Never  Vaping Use   Vaping Use: Never used  Substance and Sexual Activity   Alcohol use: Not Currently    Alcohol/week: 0.0 standard drinks   Drug use: No   Sexual activity: Not on file  Other Topics Concern   Not on file  Social History Narrative   Divorced x1, remarried.  Has 3 daughters from first marriage.   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   Distant tobacco abuse (35 pack-yr hx), quit in the late 1990s, no ETOH or drugs.   No exercise.   Social Determinants of Health   Financial Resource  Strain: Not on file  Food Insecurity: Not on file  Transportation Needs: Not on file  Physical Activity: Not on file  Stress: Not on file  Social Connections: Not on file  Intimate Partner Violence: Not on file    Allergies  Allergen Reactions   Rosuvastatin Calcium Other (See Comments)  Problem with higher dosages (Lipitor caused memory issues), also caused leg pains and lethargy   Statins Other (See Comments)    Problem with higher dosages (Lipitor caused memory issues), also caused leg pains and lethargy   Tape Rash and Other (See Comments)    Adhesive tape.  (Paper tape OK)    Current Facility-Administered Medications  Medication Dose Route Frequency Provider Last Rate Last Admin   0.9 %  sodium chloride infusion   Intravenous Continuous Brabham, Fran Lowes, MD        REVIEW OF SYSTEMS:  [X]  denotes positive finding, [ ]  denotes negative finding Cardiac  Comments:  Chest pain or chest pressure:    Shortness of breath upon exertion:    Short of breath when lying flat:    Irregular heart rhythm:        Vascular    Pain in calf, thigh, or hip brought on by ambulation:    Pain in feet at night that wakes you up from your sleep:     Blood clot in your veins:    Leg swelling:  x       Pulmonary    Oxygen at home:    Productive cough:     Wheezing:         Neurologic    Sudden weakness in arms or legs:     Sudden numbness in arms or legs:  x   Sudden onset of difficulty speaking or slurred speech:    Temporary loss of vision in one eye:     Problems with dizziness:         Gastrointestinal    Blood in stool:     Vomited blood:         Genitourinary    Burning when urinating:     Blood in urine:        Psychiatric    Major depression:         Hematologic    Bleeding problems:    Problems with blood clotting too easily:        Skin    Rashes or ulcers:        Constitutional    Fever or chills:      PHYSICAL EXAM Vitals:   10/27/20 0854  BP: (!)  158/68  Pulse: 95  Resp: 12  Temp: 97.8 F (36.6 C)  TempSrc: Oral  SpO2: 98%  Weight: 106.6 kg  Height: 6\' 2"  (1.88 m)    Constitutional: chronically ill appearing. no distress. Appears well nourished.  Neurologic: CN intact. no focal findings. no sensory loss. Psychiatric:  Mood and affect symmetric and appropriate. Eyes:  No icterus. No conjunctival pallor. Ears, nose, throat:  mucous membranes moist. Midline trachea.  Cardiac: regular rate and rhythm.  Respiratory:  unlabored. Abdominal:  soft, non-tender, non-distended.  Peripheral vascular: 2+ femoral pulses. No palpable pedal pulses. Extremity: No edema. No cyanosis. No pallor.  Skin:     Lymphatic: no Stemmer's sign. no palpable lymphadenopathy.  PERTINENT LABORATORY AND RADIOLOGIC DATA  Most recent CBC CBC Latest Ref Rng & Units 05/27/2020 07/23/2019 07/19/2019  WBC 4.0 - 10.5 K/uL 9.5 - 7.3  Hemoglobin 13.0 - 17.0 g/dL 11.9(L) 11.9(L) 12.3(L)  Hematocrit 39.0 - 52.0 % 35.7(L) 35.0(L) 37.2(L)  Platelets 150 - 400 K/uL 231 - 324     Most recent CMP CMP Latest Ref Rng & Units 05/27/2020 07/23/2019 07/19/2019  Glucose 70 - 99 mg/dL 05/29/2020) 07/25/2019) 07/21/2019)  BUN 8 - 23 mg/dL 341(D)  24(H) 19  Creatinine 0.61 - 1.24 mg/dL 0.63(K) 1.60 1.09  Sodium 135 - 145 mmol/L 134(L) 137 135  Potassium 3.5 - 5.1 mmol/L 5.3(H) 4.7 5.4(H)  Chloride 98 - 111 mmol/L 101 99 101  CO2 22 - 32 mmol/L 27 - 26  Calcium 8.9 - 10.3 mg/dL 3.2(T) - 9.0  Total Protein 6.0 - 8.5 g/dL - - -  Total Bilirubin 0.0 - 1.2 mg/dL - - -  Alkaline Phos 39 - 117 IU/L - - -  AST 0 - 40 IU/L - - -  ALT 0 - 44 IU/L - - -    Renal function CrCl cannot be calculated (Patient's most recent lab result is older than the maximum 21 days allowed.).  Hgb A1c MFr Bld (%)  Date Value  05/27/2020 7.6 (H)    LDL Chol Calc (NIH)  Date Value Ref Range Status  06/16/2020 52 0 - 99 mg/dL Final   Direct LDL  Date Value Ref Range Status  04/21/2011 159.5 mg/dL Final     Comment:    Optimal:  <100 mg/dLNear or Above Optimal:  100-129 mg/dLBorderline High:  130-159 mg/dLHigh:  160-189 mg/dLVery High:  >190 mg/dL     Vascular Imaging:  +-------+-----------+-----------+------------+------------+  ABI/TBIToday's ABIToday's TBIPrevious ABIPrevious TBI  +-------+-----------+-----------+------------+------------+  Right  0.77       0.36       1.15        0.91          +-------+-----------+-----------+------------+------------+  Left   0.99       0.57       1.12        1.02          +-------+-----------+-----------+------------+------------+   Rande Brunt. Lenell Antu, MD [facility] Phone Number: [phone] 10/27/2020 9:17 AM  Total time spent on preparing this encounter including chart review, data review, collecting history, examining the patient, coordinating care for this new patient, 60 minutes.  Portions of this report may have been transcribed using voice recognition software.  Every effort has been made to ensure accuracy; however, inadvertent computerized transcription errors may still be present.

## 2020-10-27 NOTE — Progress Notes (Signed)
Pt is wearing a glucose monitor. Shows CBG is 241. Gave self a bolus, pt managing sugar levels.

## 2020-11-16 NOTE — Progress Notes (Signed)
[facility] [place]  ASSESSMENT / PLAN: Dr. [name] pt [name] is a 70 y.o. 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  Recommend the following which can slow the progression of atherosclerosis and reduce the risk of major adverse cardiac / limb events:  Complete cessation from all tobacco products. Blood glucose control with goal A1c < 7%. Blood pressure control with goal blood pressure < 140/90 mmHg. Lipid reduction therapy with goal LDL-C <100 mg/dL (<88 if symptomatic from PAD).  Aspirin 81mg  PO QD.  Atorvastatin 40-80mg  PO QD (or other "high intensity" statin therapy).  Reviewed angiogram results in detail with patient today.  No options for improved flow to the foot.  Thankfully I do think he has enough collateral flow to heal the wounds about his foot.  Recommend continued local wound care.  CHIEF COMPLAINT: right foot ulcer  HISTORY OF PRESENT ILLNESS: [name] is a 70 y.o. male who presents to clinic for evaluation of peripheral arterial disease. He underwent intramedullary nail of the right tibia and removal of deep orthopedic hardware on 05/28/2020.  He developed an ulcer on his posterior heel shortly thereafter.  The ulcer has been slow to heal since that time.  The patient has also noticed a new wound develop on his right great toe.  Prior to his orthopedic difficulty, the patient reports a fairly classic history of claudication.  He has neuropathy in does not have much sensation in his feet.  He has an extensive past cardiovascular history.  He has been taken care of by Dr. [name] in the past.  11/17/2020: Patient returns to clinic to review angiogram findings.  We discussed the findings in detail, and reviewed the images together.  I explained the limited options we have going forward.  Thankfully he appears to be healing.  Past Medical History:  Diagnosis Date   Abnormal stress test 10/27/2011    lat isch--cardiac cath 10/28/2011   Aortic stenosis, moderate Sept/Oct /2013   2017-> s/p bioprosthetic AV [facility] Ease bovine pericardial valve model 3300 TFX, ser # [id number]).   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 Inferolateral perfusion defect + EKG abnormality during stress testing prompted Cath by [facility]: 3V CAD, EF normal.  Med mgmt recommended; b. 06/2015 Cath: 3VD and mod AS; c. 07/2015 CABG x 3 (LIMA->LAD, VG->OM2, VG->PDA) w/ AVR.   Carotid stenosis, right 09/2011   Dr. [name] did R carotid stenting 01/2014.  Carotid dopplers 06/2015 essentially normal.  No change 07/2016--repeat 1 yr.   Complication of anesthesia    difficulty with intubation,    Decreased pedal pulses    LE doppler 11/08/11- no evidence of arterial insufficiency   Diabetes mellitus Dx'd age 35   Novolog via insulin pump; sub-optimal control x years   Diabetic neuropathy (HCC)    fine touch and position sense affected   Diabetic retinopathy    Proliferative: Hx of retinal detachment on right-light perception only in right eye   Diastolic dysfunction 2007; 2016   TEE with diastolic dysfunction, EF 74%.   Difficult intubation    ~ 2002 difficult fiberoptic intubation with takeback bleeding s/p thyroidectomy;  for thyroidectomy was intubated DL X 1 with cricoid pressure but difficult mask (full beard)   Dizziness    feels off balance   Erectile dysfunction    [place]rologist [place]   GERD (gastroesophageal reflux disease)    Headache    resolved  Heart murmur    History of tobacco abuse    Quit about 1990 (has 35 pack-yr hx)   Hyperlipemia, mixed    a. intolerant of statins/zeta; per Dr. Gwenlyn Found 12/2016, pt started on PCSK9--incomplete responder?---01/2017.   Hypothyroidism, postsurgical    Thyroidectomy 2002; multinodular goiter.  Dr. Cruzita Lederer managing this as of 10/2015.   Impaired vision    right eye light perception only; hx of retinal detachment.   Impingement syndrome of right shoulder    08/26/15 Pt  got subacromial steroid injection by orthopedist (Dr. Tama Headings).  [facility] f/u 01/2016--MRI showed RC tear + AC joint arthritis, arthroscopic surgery done 03/2016 (ortho-[facility]).   Neuromuscular disorder (HCC)    neuropathy   OSA (obstructive sleep apnea)    06/2011 sleep study: moderate OSA, CPAP at 12 cm H2O.   Osteoarthritis    Bilat thumb carpometacarpal joints.  Ortho injected each thumb x 2 in 2017.  Also injected 03/14/17.   Recurrent pneumonia    Shortness of breath dyspnea    Stroke [facility])    TIA   TIA (transient ischemic attack) 2015/16   with R carotid dz: pt got R carotid stent 01/2014 by Dr. Gwenlyn Found and was put on dual antiplatelet therapy with ASA 325mg  and plavix 75mg  qd afterwards   TIA (transient ischemic attack) 05/2015   Left brain (right sided numbness + slurred speech)  Admitted for obs/workup 05/2015.   Venous insufficiency    Venous reflux 10/14/2011   venous doppler-R GSV continuous reflux throughout; too small for VNUS closure    Past Surgical History:  Procedure Laterality Date   ABDOMINAL AORTOGRAM W/LOWER EXTREMITY N/A 10/27/2020   Procedure: ABDOMINAL AORTOGRAM W/LOWER EXTREMITY;  Surgeon: Broadus John, MD;  Location: [place] [facility];  Service: Vascular;  Laterality: N/A;   ABI  01/2016   Normal.  Dr. Gwenlyn Found to repeat in 1 yr.   AORTIC VALVE REPLACEMENT N/A 07/09/2015   Bovine pericardial valve.  Procedure: AORTIC VALVE REPLACEMENT (AVR);  Surgeon: Melrose Nakayama, MD;  Location: [place];  Service: Open Heart Surgery;  Laterality: N/A;   CARDIAC CATHETERIZATION  10/2011   EF normal.  Diffuse 3 vessel CAD, no stents placed.  Medical mgmt per [facility].   CARDIAC CATHETERIZATION N/A 06/22/2015   Procedure: Right/Left Heart Cath and Coronary Angiography;  Surgeon: Lorretta Harp, MD;  Location: [place] [facility];  Service: Cardiovascular;  Laterality: N/A;   carotid dopplers  06/30/15   Normal: repeat when clinically indicated per Dr. Gwenlyn Found   CAROTID  STENT INSERTION Right 01/16/2014   Procedure: CAROTID STENT INSERTION;  Surgeon: Lorretta Harp, MD;  Location[facility] CATH LAB;  Service: Cardiovascular;  Laterality: Right;   CATARACT EXTRACTION     left   CORONARY ARTERY BYPASS GRAFT N/A 07/09/2015   Procedure: CORONARY ARTERY BYPASS GRAFTING (CABG)TIMES THREE USING LEFT INTERNAL MAMMARY ARTERY AND LEFT SAPHENOUS LEG VEIN HARVESTED ENDOSCOPICALLY;  Surgeon: Melrose Nakayama, MD;  Location: [place];  Service: Open Heart Surgery;  Laterality: N/A;   EYE SURGERY     Multiple laser surgeries for diabetic retinopathy, also cataract surgery OU.   HARDWARE REMOVAL Right 05/28/2020   Procedure: HARDWARE REMOVAL AND EXCHANGE RIGHT ANKLE;  Surgeon: Erle Crocker, MD;  Location: [place];  Service: Orthopedics;  Laterality: Right;   INTRAOPERATIVE TRANSESOPHAGEAL ECHOCARDIOGRAM N/A 07/09/2015   Procedure: INTRAOPERATIVE TRANSESOPHAGEAL ECHOCARDIOGRAM;  Surgeon: Melrose Nakayama, MD;  Location: [place];  Service: Open Heart Surgery;  Laterality:  N/A;   lasix eye     LEFT AND RIGHT HEART CATHETERIZATION WITH CORONARY ANGIOGRAM N/A 10/28/2011   Procedure: LEFT AND R0IHT HEART CATHETERIZATION WITH CORONARY ANGIOGRAM;  Surgeon: Lennette Bihari, MD;  Location: [facility] CATH LAB;  Service: Cardiovascular;  Laterality: N/A;   ORIF ANKLE FRACTURE Right 07/23/2019   Procedure: OPEN REDUCTION RIGHT ANKLE DISLOCATION WITH REPAIR, OPEN TREATMENT OF TIBIAL PLAFOND FRACTURE WITH LATERAL MALLEOLUS, OPEN TREATMENT OF SYNDESMOSIS;  Surgeon: Terance Hart, MD;  Location: [facility] OR;  Service: Orthopedics;  Laterality: Right;  LENGTH OF SURGERY: 2.5 HOURS   RETINAL DETACHMENT SURGERY     Right eye   ROTATOR CUFF REPAIR W/ DISTAL CLAVICLE EXCISION Right 03/2016   Arthroscopic ([facility])   THYROID SURGERY  2002   TIBIA IM NAIL INSERTION Right 05/28/2020   Procedure: INTRAMEDULLARY NAIL RIGHT TIBIA;  Surgeon: Terance Hart, MD;  Location: [facility] OR;  Service: Orthopedics;   Laterality: Right;   TRANSTHORACIC ECHOCARDIOGRAM  10/2011;12/2012;01/2015; 08/2015; 07/28/16   Mod AS, mild LVH, EF 60-65%, no RWMA.  01/2015 EF 60-65%, grade I DD, progression of mod/sev AS.  08/2015 normal lv fxn with normal fxning bioprosthetic Ao valve.  07/2016--no change compared to 2017 echo.      Family History  Problem Relation Age of Onset   Cancer Mother        lung cancer   Alcohol abuse Father    Cancer Father        laryngeal cancer   Cancer Brother        oldest brother had lung cancer and melanoma    Social History   Socioeconomic History   Marital status: Married    Spouse name: Not on file   Number of children: Not on file   Years of education: Not on file   Highest education level: Not on file  Occupational History   Not on file  Tobacco Use   Smoking status: Former    Types: Cigarettes    Quit date: 99    Years since quitting: 39.8   Smokeless tobacco: Never  Vaping Use   Vaping Use: Never used  Substance and Sexual Activity   Alcohol use: Not Currently    Alcohol/week: 0.0 standard drinks   Drug use: No   Sexual activity: Not on file  Other Topics Concern   Not on file  Social History Narrative   Divorced x1, remarried.  Has 3 daughters from first marriage.   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   Distant tobacco abuse (35 pack-yr hx), quit in the late 1990s, no ETOH or drugs.   No exercise.   Social Determinants of Health   Financial Resource Strain: Not on file  Food Insecurity: Not on file  Transportation Needs: Not on file  Physical Activity: Not on file  Stress: Not on file  Social Connections: Not on file  Intimate Partner Violence: Not on file    Allergies  Allergen Reactions   Rosuvastatin Calcium Other (See Comments)    Problem with higher dosages (Lipitor caused memory issues), also caused leg pains and lethargy    Statins Other (See Comments)    Problem with higher dosages (Lipitor caused memory issues), also caused leg pains and lethargy   Tape Rash and Other (See Comments)    Adhesive tape.  (Paper tape OK)    Current Outpatient Medications  Medication Sig Dispense Refill   aspirin EC 81 MG tablet Take 81 mg by mouth daily with lunch.     clopidogrel (PLAVIX) 75 MG tablet TAKE 1 TABLET BY MOUTH EVERY DAY (Patient taking differently: Take 75 mg by mouth daily at 12 noon.) 90 tablet 3   clotrimazole-betamethasone (LOTRISONE) cream Apply 1 application topically 2 (two) times daily as needed (itching/irritation).     dimenhyDRINATE (DRAMAMINE) 50 MG tablet Take 50 mg by mouth daily with lunch.     Feverfew 380 MG CAPS Take 380 mg by mouth daily with lunch. To prevent migraines     Insulin Human (INSULIN PUMP) SOLN Inject into the skin continuous. Basal rate .95/hr, current pump settings: 12am .7, 2am 1, 7am 1.1, 11am .9, 5pm .9.25, 11pm .85. Uses Humalog U-100 Insulin.     insulin lispro (HUMALOG) 100 UNIT/ML injection Use 60 units via insulin pump. (Patient taking differently: Use via insulin pump.) 90 mL 2   levothyroxine (SYNTHROID) 175 MCG tablet Take 175 mcg by mouth daily before breakfast.     loperamide (IMODIUM A-D) 2 MG tablet Take 2 mg by mouth every other day.     losartan-hydrochlorothiazide (HYZAAR) 100-12.5 MG tablet Take 1 tablet by mouth daily at 12 noon.     Multiple Vitamin (MULTIVITAMIN WITH MINERALS) TABS tablet Take 1 tablet by mouth daily with lunch. Multivitamin for Men 50+     omeprazole (PRILOSEC OTC) 20 MG tablet Take 20 mg by mouth daily with lunch.     Probiotic Product (PROBIOTIC PO) Take 1 capsule by mouth daily with lunch.     [place] 420 MG/3.5ML SOCT INJECT 1 DOSE INTO THE SKIN EVERY 30 (THIRTY) DAYS. 10.5 mL 4   rosuvastatin (CRESTOR) 5 MG tablet Take 1 tablet (5 mg total) by mouth daily. 90 tablet 3   No current facility-administered medications for  this visit.    REVIEW OF SYSTEMS:  [X]  denotes positive finding, [ ]  denotes negative finding Cardiac  Comments:  Chest pain or chest pressure:    Shortness of breath upon exertion:    Short of breath when lying flat:    Irregular heart rhythm:        Vascular    Pain in calf, thigh, or hip brought on by ambulation:    Pain in feet at night that wakes you up from your sleep:     Blood clot in your veins:    Leg swelling:  x       Pulmonary    Oxygen at home:    Productive cough:     Wheezing:         Neurologic    Sudden weakness in arms or legs:     Sudden numbness in arms or legs:  x   Sudden onset of difficulty speaking or slurred speech:    Temporary loss of vision in one eye:     Problems with dizziness:         Gastrointestinal    Blood in stool:     Vomited blood:         Genitourinary    Burning when urinating:     Blood in urine:        Psychiatric    Major depression:         Hematologic    Bleeding problems:    Problems with blood clotting too easily:        Skin    Rashes or ulcers:  Constitutional    Fever or chills:      PHYSICAL EXAM Vitals:   11/17/20 1347  BP: (!) 152/70  Pulse: 100  Resp: 20  Temp: 98.1 F (36.7 C)  SpO2: 96%  Weight: 237 lb (107.5 kg)  Height: 6\' 2"  (1.88 m)     Constitutional: chronically ill appearing. no distress. Appears well nourished.  Neurologic: CN intact. no focal findings. no sensory loss. Psychiatric:  Mood and affect symmetric and appropriate. Eyes:  No icterus. No conjunctival pallor. Ears, nose, throat:  mucous membranes moist. Midline trachea.  Cardiac: regular rate and rhythm.  Respiratory:  unlabored. Abdominal:  soft, non-tender, non-distended.  Peripheral vascular: 2+ femoral pulses. No palpable pedal pulses. Extremity: No edema. No cyanosis. No pallor.  Skin: slightly improved appearance of the right foot Lymphatic: no Stemmer's sign. no palpable lymphadenopathy.  PERTINENT  LABORATORY AND RADIOLOGIC DATA  Most recent CBC CBC Latest Ref Rng & Units 10/27/2020 05/27/2020 07/23/2019  WBC 4.0 - 10.5 K/uL - 9.5 -  Hemoglobin 13.0 - 17.0 g/dL 12.6(L) 11.9(L) 11.9(L)  Hematocrit 39.0 - 52.0 % 37.0(L) 35.7(L) 35.0(L)  Platelets 150 - 400 K/uL - 231 -     Most recent CMP CMP Latest Ref Rng & Units 10/27/2020 05/27/2020 07/23/2019  Glucose 70 - 99 mg/dL 249(H) 237(H) 231(H)  BUN 8 - 23 mg/dL 30(H) 27(H) 24(H)  Creatinine 0.61 - 1.24 mg/dL 1.10 1.42(H) 1.10  Sodium 135 - 145 mmol/L 138 134(L) 137  Potassium 3.5 - 5.1 mmol/L 5.1 5.3(H) 4.7  Chloride 98 - 111 mmol/L 103 101 99  CO2 22 - 32 mmol/L - 27 -  Calcium 8.9 - 10.3 mg/dL - 8.4(L) -  Total Protein 6.0 - 8.5 g/dL - - -  Total Bilirubin 0.0 - 1.2 mg/dL - - -  Alkaline Phos 39 - 117 IU/L - - -  AST 0 - 40 IU/L - - -  ALT 0 - 44 IU/L - - -    Renal function CrCl cannot be calculated (Patient's most recent lab result is older than the maximum 21 days allowed.).  Hgb A1c MFr Bld (%)  Date Value  05/27/2020 7.6 (H)    LDL Chol Calc (NIH)  Date Value Ref Range Status  06/16/2020 52 0 - 99 mg/dL Final   Direct LDL  Date Value Ref Range Status  04/21/2011 159.5 mg/dL Final    Comment:    Optimal:  <100 mg/dLNear or Above Optimal:  100-129 mg/dLBorderline High:  130-159 mg/dLHigh:  160-189 mg/dLVery High:  >190 mg/dL     Vascular Imaging:  +-------+-----------+-----------+------------+------------+  ABI/TBIToday's ABIToday's TBIPrevious ABIPrevious TBI  +-------+-----------+-----------+------------+------------+  Right  0.77       0.36       1.15        0.91          +-------+-----------+-----------+------------+------------+  Left   0.99       0.57       1.12        1.02          +-------+-----------+-----------+------------+------------+   Yevonne Aline. Stanford Breed, MD [facility] Phone Number: [phone] 11/17/2020 6:04 PM  Portions of this  report may have been transcribed using voice recognition software.  Every effort has been made to ensure accuracy; however, inadvertent computerized transcription errors may still be present.

## 2020-11-17 ENCOUNTER — Other Ambulatory Visit: Payer: Self-pay

## 2020-11-17 ENCOUNTER — Ambulatory Visit (INDEPENDENT_AMBULATORY_CARE_PROVIDER_SITE_OTHER): Payer: HMO | Admitting: Vascular Surgery

## 2020-11-17 ENCOUNTER — Encounter: Payer: Self-pay | Admitting: Vascular Surgery

## 2020-11-17 VITALS — BP 152/70 | HR 100 | Temp 98.1°F | Resp 20 | Ht 74.0 in | Wt 237.0 lb

## 2020-11-17 DIAGNOSIS — I739 Peripheral vascular disease, unspecified: Secondary | ICD-10-CM

## 2020-11-19 ENCOUNTER — Other Ambulatory Visit: Payer: Self-pay

## 2020-11-19 DIAGNOSIS — I739 Peripheral vascular disease, unspecified: Secondary | ICD-10-CM

## 2020-12-23 ENCOUNTER — Telehealth: Payer: Self-pay | Admitting: Internal Medicine

## 2020-12-23 NOTE — Telephone Encounter (Signed)
Attempted PA via CMM for repatha pushtronex Message received: Available without authorization (Key: [id number])

## 2021-03-02 ENCOUNTER — Encounter: Payer: Self-pay | Admitting: Cardiovascular Disease

## 2021-03-02 ENCOUNTER — Ambulatory Visit: Payer: HMO | Admitting: Cardiovascular Disease

## 2021-03-02 ENCOUNTER — Other Ambulatory Visit: Payer: Self-pay

## 2021-03-02 VITALS — BP 130/56 | HR 93 | Ht 74.0 in | Wt 238.4 lb

## 2021-03-02 DIAGNOSIS — I6521 Occlusion and stenosis of right carotid artery: Secondary | ICD-10-CM

## 2021-03-02 DIAGNOSIS — I739 Peripheral vascular disease, unspecified: Secondary | ICD-10-CM

## 2021-03-02 DIAGNOSIS — E782 Mixed hyperlipidemia: Secondary | ICD-10-CM

## 2021-03-02 DIAGNOSIS — I10 Essential (primary) hypertension: Secondary | ICD-10-CM | POA: Diagnosis not present

## 2021-03-02 DIAGNOSIS — I2510 Atherosclerotic heart disease of native coronary artery without angina pectoris: Secondary | ICD-10-CM

## 2021-03-02 DIAGNOSIS — I350 Nonrheumatic aortic (valve) stenosis: Secondary | ICD-10-CM

## 2021-03-02 DIAGNOSIS — Z951 Presence of aortocoronary bypass graft: Secondary | ICD-10-CM

## 2021-03-02 DIAGNOSIS — I2583 Coronary atherosclerosis due to lipid rich plaque: Secondary | ICD-10-CM

## 2021-03-02 NOTE — Assessment & Plan Note (Signed)
History of PAD status post failed attempt at right SFA endovascular therapy for critical limb ischemia by Dr. Sherral Hammers in October 2022.  He had one-vessel runoff via the peroneal artery.  The wound has slowly healed.

## 2021-03-02 NOTE — Assessment & Plan Note (Signed)
History of aortic stenosis status post aortic valve replacement with a bioprosthetic valve ([employer]onic Magna Ease 23 mm pericardial tissue valve) on 07/09/2015.  His most recent 2D echo performed 03/26/2020 revealed normal LV systolic function with a well-functioning aortic bioprosthesis.  The peak gradient measured was 22 mmHg with a valve area of 1.35 cm.  He denies chest pain or shortness of breath.  We will recheck a 2D echocardiogram.

## 2021-03-02 NOTE — Assessment & Plan Note (Signed)
History of CAD status post coronary artery bypass grafting x3 on 06/10/2015 with a LIMA to his LAD, vein to an obtuse marginal branch #2 and PDA.  This was done in setting of AVR as well.  He denies chest pain or shortness of breath.

## 2021-03-02 NOTE — Progress Notes (Signed)
03/02/2021 Para March   09-19-1950  [id number]  Primary Physician Podraza, Sindy Messing, PA-C Primary Cardiologist: Lorretta Harp MD FACP, [place], [place], [place]  HPI:  [name] is a 71 y.o.  mildly overweight married Caucasian male, father of 22 and grandfather to 3 grandchildren, I last saw him in the office 03/04/2020.  He has a history of insulin-dependent diabetes on insulin pump, mild sleep apnea, and hyperlipidemia as well as mild aortic stenosis. He had a Myoview stress test that was abnormal which led to a heart catheterization performed by Dr. Claiborne Billings in my absence revealing 70% disease in all 3 vessels with preserved LV function and mild AS. He also had moderate right ICA stenosis. He was neurologically asymptomatic. He was placed on beta-blocker and Ran[place]a Carotid Dopplers showed stable moderate right ICA stenosis. He was admitted to [facility] with 2 sequential TIAs. Carotid Dopplers did show moderate right ICA stenosis. The patient was seen by Dr. Leonie Man, stroke neurologist, asked Dr. Bridgett Larsson, vascular surgeon for consultation. Dr. Bridgett Larsson ordered a CTA which showed high-grade ostial right internal carotid artery stenosis. I am asked to see the patient for consideration of carotid artery stenting. The patient was offered carotid endarterectomy. Mr. [name] apparently had a bad experience remotely being intubated at the time of the parathyroid operation and does not want to be reintubated again if at all possible making carotid stenting the best option for revascularization. I perform this on 01/16/14 successfully without complication. His follow-up Doppler 2 weeks later showed his carotid artery widely patent. Since I saw him 6 months ago he's been admitted with TIA type symptoms with negative workup. He also complains of chronic dizziness and increasing dyspnea on exertion. This recent 2-D echo showed increase in hisaortic stenosis now to moderately severewith a peak gradient  that has increased from 35 up to 52 mmHg. Mr. [name] underwent right and left heart cath by myself 06/22/15 revealing moderate aortic stenosis with three-vessel coronary artery disease. Essentially underwent elective aortic valve replacement with a Medtronic magna ease 23 mm pericardial tissue valve and bypass grafting X 3 on 07/09/15 with a LIMA to his LAD, vein to obtuse marginal branch #2 and to the PDA. He did well. Postoperatively. Follow-up 2-D echocardiogram performed 08/19/15 revealed normal LV function with a well functioning aortic bioprosthesis. He feels clinically improved. He has been completely asymptomatic since his CABG/AVR. He did have right rotator cuff surgery in March 2018.   Since I saw him in the office a year ago he has done well.  He did have a wound on his right foot/critical ischemia and underwent angiography with attempt at endovascular therapy for limb salvage by Dr. Amil Amen unsuccessfully.  He has one-vessel runoff via peroneal.  No other tibial vessels 1 to the foot.  The wound has slowly healed with local wound care.  He denies chest pain or shortness of breath.   Current Meds  Medication Sig   aspirin EC 81 MG tablet Take 81 mg by mouth daily with lunch.   clopidogrel (PLAVIX) 75 MG tablet TAKE 1 TABLET BY MOUTH EVERY DAY (Patient taking differently: Take 75 mg by mouth daily at 12 noon.)   clotrimazole-betamethasone (LOTRISONE) cream Apply 1 application topically 2 (two) times daily as needed (itching/irritation).   dimenhyDRINATE (DRAMAMINE) 50 MG tablet Take 50 mg by mouth daily with lunch.   [place] 380 MG CAPS Take 380 mg by mouth daily with lunch. To prevent migraines  Insulin Human (INSULIN PUMP) SOLN Inject into the skin continuous. Basal rate .95/hr, current pump settings: 12am .7, 2am 1, 7am 1.1, 11am .9, 5pm .9.25, 11pm .85. Uses Humalog U-100 Insulin.   insulin lispro (HUMALOG) 100 UNIT/ML injection Use 60 units via insulin pump. (Patient taking differently:  Use via insulin pump.)   Levothyroxine Sodium 150 MCG CAPS Take 150 mcg by mouth daily before breakfast.   loperamide (IMODIUM A-D) 2 MG tablet Take 2 mg by mouth every other day.   losartan-hydrochlorothiazide (HYZAAR) 100-12.5 MG tablet Take 1 tablet by mouth daily at 12 noon.   Multiple Vitamin (MULTIVITAMIN WITH MINERALS) TABS tablet Take 1 tablet by mouth daily with lunch. Multivitamin for Men 50+   omeprazole (PRILOSEC OTC) 20 MG tablet Take 20 mg by mouth daily with lunch.   [place] 420 MG/3.5ML SOCT INJECT 1 DOSE INTO THE SKIN EVERY 30 (THIRTY) DAYS.   rosuvastatin (CRESTOR) 5 MG tablet Take 1 tablet (5 mg total) by mouth daily.     Allergies  Allergen Reactions   Rosuvastatin Calcium Other (See Comments)    Problem with higher dosages (Lipitor caused memory issues), also caused leg pains and lethargy   Statins Other (See Comments)    Problem with higher dosages (Lipitor caused memory issues), also caused leg pains and lethargy   Tape Rash and Other (See Comments)    Adhesive tape.  (Paper tape OK)    Social History   Socioeconomic History   Marital status: Married    Spouse name: Not on file   Number of children: Not on file   Years of education: Not on file   Highest education level: Not on file  Occupational History   Not on file  Tobacco Use   Smoking status: Former    Types: Cigarettes    Quit date: 70    Years since quitting: 40.1   Smokeless tobacco: Never  Vaping Use   Vaping Use: Never used  Substance and Sexual Activity   Alcohol use: Not Currently    Alcohol/week: 0.0 standard drinks   Drug use: No   Sexual activity: Not on file  Other Topics Concern   Not on file  Social History Narrative   Divorced x1, remarried.  Has 3 daughters from first marriage.   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   Distant tobacco abuse (35  pack-yr hx), quit in the late 1990s, no ETOH or drugs.   No exercise.   Social Determinants of Health   Financial Resource Strain: Not on file  Food Insecurity: Not on file  Transportation Needs: Not on file  Physical Activity: Not on file  Stress: Not on file  Social Connections: Not on file  Intimate Partner Violence: Not on file     Review of Systems: General: negative for chills, fever, night sweats or weight changes.  Cardiovascular: negative for chest pain, dyspnea on exertion, edema, orthopnea, palpitations, paroxysmal nocturnal dyspnea or shortness of breath Dermatological: negative for rash Respiratory: negative for cough or wheezing Urologic: negative for hematuria Abdominal: negative for nausea, vomiting, diarrhea, bright red blood per rectum, melena, or hematemesis Neurologic: negative for visual changes, syncope, or dizziness All other systems reviewed and are otherwise negative except as noted above.    Blood pressure (!) 130/56, pulse 93, height 6\' 2"  (1.88 m), weight 238 lb 6.4 oz (108.1 kg), SpO2 97 %.  General appearance: alert and no distress Neck: no adenopathy, no JVD, supple, symmetrical, trachea midline, thyroid not enlarged, symmetric, no tenderness/mass/nodules, and bilateral carotid bruits Lungs: clear to auscultation bilaterally Heart: 2/6 outflow tract murmur consistent with aortic stenosis Extremities: extremities normal, atraumatic, no cyanosis or edema Pulses: Absent pedal pulses Skin: Skin color, texture, turgor normal. No rashes or lesions Neurologic: Grossly normal  EKG sinus rhythm at 93 with left anterior fascicular block and nonspecific ST and T wave changes.  Personally reviewed this EKG.  ASSESSMENT AND PLAN:   Aortic stenosis, moderate History of aortic stenosis status post aortic valve replacement with a bioprosthetic valve (Medtronic Magna Ease 23 mm pericardial tissue valve) on 07/09/2015.  His most recent 2D echo performed 03/26/2020  revealed normal LV systolic function with a well-functioning aortic bioprosthesis.  The peak gradient measured was 22 mmHg with a valve area of 1.35 cm.  He denies chest pain or shortness of breath.  We will recheck a 2D echocardiogram.  Essential hypertension History of essential hypertension a blood pressure measured today at 130/56.  He is on losartan, and hydrochlorothiazide.  Hyperlipidemia History of hyperlipidemia on low-dose Crestor and Repatha with lipid profile performed 06/16/2020 revealing a total cholesterol of 118, LDL 52 and HDL 51.  PAD (peripheral artery disease) (HCC) History of PAD status post failed attempt at right SFA endovascular therapy for critical limb ischemia by Dr. Unk Lightning in October 2022.  He had one-vessel runoff via the peroneal artery.  The wound has slowly healed.  Carotid stenosis, right History of carotid artery disease status post carotid artery stenting by myself 01/16/2014.  His most recent carotid Dopplers performed 03/19/2020 revealed a widely patent right carotid stent.  This will be repeated this coming month.  CAD (coronary artery disease) History of CAD status post coronary artery bypass grafting x3 on 06/10/2015 with a LIMA to his LAD, vein to an obtuse marginal branch #2 and PDA.  This was done in setting of AVR as well.  He denies chest pain or shortness of breath.     Lorretta Harp MD FACP,FACC,FAHA, [facility] 03/02/2021 8:28 AM

## 2021-03-02 NOTE — Patient Instructions (Signed)
Medication Instructions:  ?Your physician recommends that you continue on your current medications as directed. Please refer to the Current Medication list given to you today. ? ?*If you need a refill on your cardiac medications before your next appointment, please call your pharmacy* ? ? ? ?Testing/Procedures: ?Your physician has requested that you have an echocardiogram. Echocardiography is a painless test that uses sound waves to create images of your heart. It provides your doctor with information about the size and shape of your heart and how well your heart?s chambers and valves are working. This procedure takes approximately one hour. There are no restrictions for this procedure. This procedure will be done at [address] ? ? ? ?Follow-Up: ?At [facility], you and your health needs are our priority.  As part of our continuing mission to provide you with exceptional heart care, we have created designated Provider Care Teams.  These Care Teams include your primary Cardiologist (physician) and Advanced Practice Providers (APPs -  Physician Assistants and Nurse Practitioners) who all work together to provide you with the care you need, when you need it. ? ?We recommend signing up for the patient portal called "MyChart".  Sign up information is provided on this After Visit Summary.  MyChart is used to connect with patients for Virtual Visits (Telemedicine).  Patients are able to view lab/test results, encounter notes, upcoming appointments, etc.  Non-urgent messages can be sent to your provider as well.   ?To learn more about what you can do with MyChart, go to [email or web address].   ? ?Your next appointment:   ?12 month(s) ? ?The format for your next appointment:   ?In Person ? ?Provider:   ?Jonathan Berry, MD  ?

## 2021-03-02 NOTE — Assessment & Plan Note (Signed)
History of hyperlipidemia on low-dose Crestor and Repatha with lipid profile performed 06/16/2020 revealing a total cholesterol of 118, LDL 52 and HDL 51.

## 2021-03-02 NOTE — Assessment & Plan Note (Signed)
History of essential hypertension a blood pressure measured today at 130/56.  He is on losartan, and hydrochlorothiazide.

## 2021-03-02 NOTE — Assessment & Plan Note (Signed)
History of carotid artery disease status post carotid artery stenting by myself 01/16/2014.  His most recent carotid Dopplers performed 03/19/2020 revealed a widely patent right carotid stent.  This will be repeated this coming month.

## 2021-03-04 ENCOUNTER — Telehealth: Payer: Self-pay | Admitting: Internal Medicine

## 2021-03-04 DIAGNOSIS — E782 Mixed hyperlipidemia: Secondary | ICD-10-CM

## 2021-03-04 NOTE — Telephone Encounter (Signed)
Lipid panel ordered - lab reminder sent to be done before 03/16/21 appt ?

## 2021-03-07 ENCOUNTER — Other Ambulatory Visit: Payer: Self-pay | Admitting: Internal Medicine

## 2021-03-15 LAB — LIPID PANEL
Chol/HDL Ratio: 1.8 ratio (ref 0.0–5.0)
Cholesterol, Total: 123 mg/dL (ref 100–199)
HDL: 67 mg/dL (ref >39)
LDL Chol Calc (NIH): 45 mg/dL (ref 0–99)
Triglycerides: 47 mg/dL (ref 0–149)
VLDL Cholesterol Cal: 11 mg/dL (ref 5–40)

## 2021-03-16 ENCOUNTER — Ambulatory Visit (INDEPENDENT_AMBULATORY_CARE_PROVIDER_SITE_OTHER): Payer: HMO | Admitting: Internal Medicine

## 2021-03-16 ENCOUNTER — Telehealth: Payer: Self-pay | Admitting: Internal Medicine

## 2021-03-16 ENCOUNTER — Other Ambulatory Visit: Payer: Self-pay

## 2021-03-16 ENCOUNTER — Encounter (HOSPITAL_BASED_OUTPATIENT_CLINIC_OR_DEPARTMENT_OTHER): Payer: Self-pay | Admitting: Internal Medicine

## 2021-03-16 VITALS — BP 126/62 | HR 96 | Ht 74.0 in | Wt 239.6 lb

## 2021-03-16 DIAGNOSIS — I739 Peripheral vascular disease, unspecified: Secondary | ICD-10-CM

## 2021-03-16 DIAGNOSIS — Z951 Presence of aortocoronary bypass graft: Secondary | ICD-10-CM

## 2021-03-16 DIAGNOSIS — E785 Hyperlipidemia, unspecified: Secondary | ICD-10-CM

## 2021-03-16 NOTE — Telephone Encounter (Signed)
-  Informed pt that Eliezer Lofts is out of the office today and will have her call once she return.  ?-Pt verbalized understanding.  ?

## 2021-03-16 NOTE — Telephone Encounter (Signed)
Left message to call back  ? ?He is inquiring about healthwell foundation grant  ? ?The Health Well foundation offers assistance to help pay for medication copays.  They will cover copays for all cholesterol lowering meds, including statins, fibrates, omega-3 fish oils like Vascepa, ezetimibe, Repatha, Praluent, Nexletol, Nexlizet.  The cards are usually good for $2,500 or 12 months, whichever comes first. ?Go to healthwellfoundation.org ?Click on ?Apply Now? ?Answer questions as to whom is applying (patient or representative) ?Your disease fund will be ?hypercholesterolemia - Medicare access? ?They will ask questions about finances and which medications you are taking for cholesterol ?When you submit, the approval is usually within minutes.  You will need to print the card information from the site ?You will need to show this information to your pharmacy, they will bill your Medicare Part D plan first -then bill Health Well --for the copay.   ?You can also call them at [phone], although the hold times can be quite long.  ? ? ? ?

## 2021-03-16 NOTE — Telephone Encounter (Signed)
? ?  Pt is requesting to speak with Eileen Stanford, he said, he has question about the form he needs to fill out for enrolling for the foundation for his meds ?

## 2021-03-16 NOTE — Progress Notes (Signed)
? ?OFFICE NOTE ? ?Chief Complaint:  ?Lipid clinic follow-up ? ?Primary Care Physician: ?Jonathon Bellows, PA-C ? ?HPI:  ?[name] is a 71 y.o. 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  He also underwent bovine pericardial aortic valve replacement at that time.  In addition he has a history of dyslipidemia with statin intolerance.  He reports taking high-dose atorvastatin and rosuvastatin which caused both myalgias, lethargy and memory problems.  In addition he said he took ezetimibe in the past which she thought also cause some memory problems.  He was referred for evaluation of PCSK9 inhibitor.  Actually he was recently started on Repatha and has had 5 doses of the medication.  Lipid profile 7 months ago indicated total cholesterol 245, triglycerides 113, HDL 58 and LDL 164.  His most recent lipid profile 2 weeks ago showed a total cholesterol 202, triglycerides 70, HDL 75 and LDL 113.  There is concerned about incomplete response to Repatha, but it should be noted that his calculated LDL was lower, but is misrepresented as his HDL fraction has gone up from 58-75.  That being said his actual LDL is probably higher than represented.  He does report compliance with the medication but he says the injector pen does cause him discomfort.  He also uses an insulin pump and is fairly used to needles. ? ?02/07/2018 ? ?[name] is seen today in follow-up.  He is done well with the [place] Pushtronex system.  He is now using that once monthly.  He does get some occasional injection site redness with use but typically that improves after 2 days.  His cholesterol profile is also favorably responded to the medication.  His total cholesterol now as of January 12, 2018 is 176, HDL 72, LDL 94 and triglycerides 52.  He denies any other myalgias or other side effects that he had with statins.  He is  actually quite close to goal LDL less than 70.  Interestingly, he has not had a robust response as expected with the Repatha, namely a 60 to 65% reduction although does seem to have a little more benefit on the high once monthly dose.  He previously had been on the every 2 weekly Repatha dose with suboptimal response, suggesting that there may be PCSK9 or LDL mutation or perhaps a large fraction of his LDL is LP(a). ? ?01/29/2019 ? ?[name] returns today for lipid clinic follow-up.  He was previously being seen by Dr. Alvester Chou however switched his care to Dr. Philbert Riser at [facility] due to a change in his insurance.  Currently he has [employer].  Despite this, he wishes to continue to follow here for lipid management.  Recently he had lipids drawn at [facility], those were in November 2020.  Total cholesterol was 150, triglycerides 58, HDL 68 and a direct LDL was 81.  He remains on the [place] Pushtronex system.  At some point he notes that he was started on Vascepa.  He was advised to take 2 g twice daily which is the recommended dose however he has been taking only 1 g daily.  After extensive review of his previous lipids however I do not see any evidence that he has had elevated triglycerides over 150, which means use of Vascepa is not based on any guideline directed indications or supported by current literature.  In fact  his low triglycerides are not predictive of further increase cardiovascular risk. ? ?03/16/2020 ? ?[name] returns today for follow-up.  He is back to see Dr. Gwenlyn Found due to his insurance switch.  He seems to be doing well on the [place] Pushtronex system.  We will need to reauthorize that.  Hopefully we can get it to him at low to no cost.  We had previously discussed that he was not quite to target LDL.  His direct LDL was 81 however recent labs showed total cholesterol 182, triglycerides 80, HDL 61 and LDL 98.  Would like to see his LDL cholesterol below 100.  We discussed  possible additional therapies and I would prefer adding low-dose statin since most of the data for PCSK9 inhibitors were on top of statin therapy.  He had previously not been able to tolerate statins due to issues with memory and leg pains as well as lethargy, however this was at much higher doses ? ?03/16/2021 ? ?[name] is seen today in follow-up.  He recently saw Dr. Gwenlyn Found as well who is ordered repeat Dopplers.  His cholesterol remains very well controlled in fact lower than it has been in the past.  Total now 123, triglycerides 47, HDL 67 LDL 45.  He seems to be doing well with Repatha Pushtronex system management of his rosuvastatin. ? ?PMHx:  ?Past Medical History:  ?Diagnosis Date  ? Abnormal stress test 10/27/2011  ? lat isch--cardiac cath 10/28/2011  ? Aortic stenosis, moderate Sept/Oct /2013  ? 2017-> s/p bioprosthetic AV [facility] Ease bovine pericardial valve model 3300 TFX, ser # [id number]).  ? 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 a. Inferolateral perfusion defect + EKG abnormality during stress testing prompted Cath by [facility]: 3V CAD, EF normal.  Med mgmt recommended; b. 06/2015 Cath: 3VD and mod AS; c. 07/2015 CABG x 3 (LIMA->LAD, VG->OM2, VG->PDA) w/ AVR.  ? Carotid stenosis, right 09/2011  ? Dr. Gwenlyn Found did R carotid stenting 01/2014.  Carotid dopplers 06/2015 essentially normal.  No change 07/2016--repeat 1 yr.  ? Complication of anesthesia   ? difficulty with intubation,   ? Decreased pedal pulses   ? LE doppler 11/08/11- no evidence of arterial insufficiency  ? Diabetes mellitus Dx'd age 41  ? Novolog via insulin pump; sub-optimal control x years  ? Diabetic neuropathy ([place])   ? fine touch and position sense affected  ? Diabetic retinopathy   ? Proliferative: Hx of retinal detachment on right-light perception only in right eye  ? Diastolic dysfunction [id number]; 2016  ? TEE with diastolic dysfunction, EF [id number].  ? Difficult intubation   ? ~ 2002 difficult fiberoptic intubation with takeback bleeding s/p  thyroidectomy;  for thyroidectomy was intubated DL X 1 with cricoid pressure but difficult mask (full beard)  ? Dizziness   ? feels off balance  ? Erectile dysfunction   ? [place]rolog[place]  ? GERD (gastroesophageal reflux disease)   ? Headache   ? resolved  ? Heart murmur   ? History of tobacco abuse   ? Quit about 1990 (has 35 pack-yr hx)  ? Hyperlipemia, mixed   ? a. intolerant of statins/zeta; per Dr. Gwenlyn Found 12/2016, pt started on PCSK9--incomplete responder?---01/2017.  ? Hypothyroidism, postsurgical   ? Thyroidectomy 2002; multinodular goiter.  Dr. Cruzita Lederer managing this as of 10/2015.  ? Impaired vision   ? right eye light perception only; hx of retinal detachment.  ? Impingement syndrome of right shoulder   ? 08/26/15 Pt got  subacromial steroid injection by orthopedist (Dr. Tama Headings).  [facility] [place] f/u 01/2016--MRI showed RC tear + AC joint arthritis, arthroscopic surgery done 03/2016 ([facility]).  ? Neuromuscular disorder ([place])   ? neuropathy  ? OSA (obstructive sleep apnea)   ? 06/2011 sleep study: moderate OSA, CPAP at 12 cm H2O.  ? Osteoarthritis   ? Bilat thumb carpometacarpal joints.  Ortho injected each thumb x 2 in 2017.  Also injected 03/14/17.  ? Recurrent pneumonia   ? Shortness of breath dyspnea   ? Stroke [facility])   ? TIA  ? TIA (transient ischemic attack) 2015/16  ? with R carotid dz: pt got R carotid stent 01/2014 by Dr. Gwenlyn Found and was put on dual antiplatelet therapy with ASA 325mg  and plavix 75mg  qd afterwards  ? TIA (transient ischemic attack) 05/2015  ? Left brain (right sided numbness + slurred speech)  Admitted for obs/workup 05/2015.  ? Venous insufficiency   ? Venous reflux 10/14/2011  ? venous doppler-R GSV continuous reflux throughout; too small for VNUS closure  ? ? ?Past Surgical History:  ?Procedure Laterality Date  ? ABDOMINAL AORTOGRAM W/LOWER EXTREMITY N/A 10/27/2020  ? Procedure: ABDOMINAL AORTOGRAM W/LOWER EXTREMITY;  Surgeon: Broadus John, MD;  Location: [place] [facility];  Service: Vascular;  Laterality: N/A;  ? ABI  01/2016  ? Normal.  Dr. Gwenlyn Found to repeat in 1 yr.  ? AORTIC VALVE REPLACEMENT N/A 07/09/2015  ? Bovine pericardial valve.  Procedure: AORTIC VALVE REPLACEMENT (AVR);

## 2021-03-16 NOTE — Patient Instructions (Signed)
Medication Instructions:  ?Your physician recommends that you continue on your current medications as directed. Please refer to the Current Medication list given to you today. ? ?*If you need a refill on your cardiac medications before your next appointment, please call your pharmacy* ? ? ?Lab Work: ?FASTING lab work to check cholesterol in 1 year - complete before your next appointment with Dr. Rennis Golden  ? ?If you have labs (blood work) drawn today and your tests are completely normal, you will receive your results only by: ?MyChart Message (if you have MyChart) OR ?A paper copy in the mail ?If you have any lab test that is abnormal or we need to change your treatment, we will call you to review the results. ? ? ?Testing/Procedures: ?NONE ? ? ?Follow-Up: ?At [facility], you and your health needs are our priority.  As part of our continuing mission to provide you with exceptional heart care, we have created designated Provider Care Teams.  These Care Teams include your primary Cardiologist (physician) and Advanced Practice Providers (APPs -  Physician Assistants and Nurse Practitioners) who all work together to provide you with the care you need, when you need it. ? ?We recommend signing up for the patient portal called "MyChart".  Sign up information is provided on this After Visit Summary.  MyChart is used to connect with patients for Virtual Visits (Telemedicine).  Patients are able to view lab/test results, encounter notes, upcoming appointments, etc.  Non-urgent messages can be sent to your provider as well.   ?To learn more about what you can do with MyChart, go to [email or web address].   ? ?Your next appointment:   ? ?12 months with Dr. Rennis Golden  ? ?Other Instructions ? ?The [employer] foundation offers assistance to help pay for medication copays.  They will cover copays for all cholesterol lowering meds, including statins, fibrates, omega-3 fish oils like Vascepa, ezetimibe, Repatha, Praluent, Nexletol,  Nexlizet.  The cards are usually good for $2,500 or 12 months, whichever comes first. ?Go to healthwellfoundation.org ?Click on ?Apply Now? ?Answer questions as to whom is applying (patient or representative) ?Your disease fund will be ?hypercholesterolemia - Medicare access? ?They will ask questions about finances and which medications you are taking for cholesterol ?When you submit, the approval is usually within minutes.  You will need to print the card information from the site ?You will need to show this information to your pharmacy, they will bill your Medicare Part D plan first -then bill Health Well --for the copay.   ?You can also call them at [phone], although the hold times can be quite long.  ? ? ? ? ?

## 2021-03-19 ENCOUNTER — Other Ambulatory Visit: Payer: Self-pay

## 2021-03-19 ENCOUNTER — Ambulatory Visit (HOSPITAL_COMMUNITY)
Admission: RE | Admit: 2021-03-19 | Discharge: 2021-03-19 | Disposition: A | Discharge status: Home / Self Care | Payer: HMO | Source: Ambulatory Visit | Attending: Internal Medicine | Admitting: Internal Medicine

## 2021-03-19 DIAGNOSIS — I6521 Occlusion and stenosis of right carotid artery: Secondary | ICD-10-CM

## 2021-03-19 DIAGNOSIS — Z95828 Presence of other vascular implants and grafts: Secondary | ICD-10-CM | POA: Diagnosis not present

## 2021-03-19 NOTE — Telephone Encounter (Signed)
Patient approved for co-pay assistance grant from [employer] ?Hypercholesterolemia - medicare access ? ?Healthwell ID: [id number] ?Pharmacy card ID: [id number] ?PC Group: [id number] ?PC BIN: [id number] ?PC PCN: [id number] ?PC Processor: [id number] ? ?Effective 02/17/21 - 02/16/22 ?

## 2021-03-23 ENCOUNTER — Other Ambulatory Visit: Payer: Self-pay

## 2021-03-23 ENCOUNTER — Ambulatory Visit (HOSPITAL_COMMUNITY): Payer: HMO | Attending: Cardiology

## 2021-03-23 DIAGNOSIS — I350 Nonrheumatic aortic (valve) stenosis: Secondary | ICD-10-CM

## 2021-03-23 DIAGNOSIS — I10 Essential (primary) hypertension: Secondary | ICD-10-CM

## 2021-03-23 DIAGNOSIS — Z951 Presence of aortocoronary bypass graft: Secondary | ICD-10-CM | POA: Diagnosis not present

## 2021-03-23 LAB — ECHOCARDIOGRAM COMPLETE
AV Mean grad: 13.7 mmHg
AV Peak grad: 23.6 mmHg
Ao pk vel: 2.43 m/s
Area-P 1/2: 4.8 cm2
MV M vel: 6.29 m/s
MV Peak grad: 158 mmHg
S' Lateral: 3.4 cm

## 2021-04-14 ENCOUNTER — Telehealth: Payer: Self-pay | Admitting: Internal Medicine

## 2021-04-14 NOTE — Telephone Encounter (Signed)
Attempted repatha PA in CMM ?Was unable to complete so called insurance at [phone] ?Med is approved until 01/02/22 ?

## 2021-04-23 ENCOUNTER — Other Ambulatory Visit (HOSPITAL_COMMUNITY): Payer: Self-pay | Admitting: Cardiovascular Disease

## 2021-04-23 DIAGNOSIS — I6523 Occlusion and stenosis of bilateral carotid arteries: Secondary | ICD-10-CM

## 2021-04-28 ENCOUNTER — Encounter: Payer: Self-pay | Admitting: Cardiovascular Disease

## 2021-04-28 ENCOUNTER — Ambulatory Visit (INDEPENDENT_AMBULATORY_CARE_PROVIDER_SITE_OTHER): Payer: HMO | Admitting: Cardiovascular Disease

## 2021-04-28 VITALS — BP 118/60 | HR 88 | Ht 74.0 in | Wt 238.0 lb

## 2021-04-28 DIAGNOSIS — Z951 Presence of aortocoronary bypass graft: Secondary | ICD-10-CM

## 2021-04-28 DIAGNOSIS — I10 Essential (primary) hypertension: Secondary | ICD-10-CM | POA: Diagnosis not present

## 2021-04-28 NOTE — Patient Instructions (Signed)
Medication Instructions:  ?Your physician recommends that you continue on your current medications as directed. Please refer to the Current Medication list given to you today. ? ?*If you need a refill on your cardiac medications before your next appointment, please call your pharmacy* ? ? ?Testing/Procedures: ?Your physician has requested that you have an echocardiogram. Echocardiography is a painless test that uses sound waves to create images of your heart. It provides your doctor with information about the size and shape of your heart and how well your heart?s chambers and valves are working. This procedure takes approximately one hour. There are no restrictions for this procedure. To do in Oct. This procedure will be done at [address] ? ? ? ?Follow-Up: ?At [facility], you and your health needs are our priority.  As part of our continuing mission to provide you with exceptional heart care, we have created designated Provider Care Teams.  These Care Teams include your primary Cardiologist (physician) and Advanced Practice Providers (APPs -  Physician Assistants and Nurse Practitioners) who all work together to provide you with the care you need, when you need it. ? ?We recommend signing up for the patient portal called "MyChart".  Sign up information is provided on this After Visit Summary.  MyChart is used to connect with patients for Virtual Visits (Telemedicine).  Patients are able to view lab/test results, encounter notes, upcoming appointments, etc.  Non-urgent messages can be sent to your provider as well.   ?To learn more about what you can do with MyChart, go to [email or web address].   ? ?Your next appointment:   ?6 month(s) ? ?The format for your next appointment:   ?In Person ? ?Provider:   ?Quay Burow, MD  ?

## 2021-04-28 NOTE — Progress Notes (Signed)
[name] returns today for follow-up of his recent 2D echocardiogram performed 03/23/2021.  He is 6 years status post CABG/AVR.  Echo performed a year ago showed only mild MR.  Recent echo showed normal LV systolic function, normally functioning aortic bioprosthetic valve and moderate to severe MR.  Patient is completely asymptomatic.  His LV size is normal.  He only has mild left atrial enlargement.  I am going to recheck an echo in 6 months to see if this is reproducible.  If he is still has severe MR we will recommend TEE to determine pathology. ? ?Runell Gess, M.D., FACP, [facility], [place], [place] ?[facility] ?[address] ?[place], [place]  [address]  ?[phone] ?04/28/2021 ?9:41 AM  ?

## 2021-05-17 NOTE — Progress Notes (Signed)
[facility] [place] ? ?ASSESSMENT / PLAN: ?Dr. Gwenlyn Found pt ?[name] is a 71 y.o. male with atherosclerosis of native arteries of right lower extremity causing no symptoms. ? ?Recommend the following which can slow the progression of atherosclerosis and reduce the risk of major adverse cardiac / limb events:  ?Complete cessation from all tobacco products. ?Blood glucose control with goal A1c < 7%. ?Blood pressure control with goal blood pressure < 140/90 mmHg. ?Lipid reduction therapy with goal LDL-C <100 mg/dL (<70 if symptomatic from PAD).  ?Aspirin 81mg  PO QD.  ?Atorvastatin 40-80mg  PO QD (or other "high intensity" statin therapy). ? ?Thankfully he is doing well despite extensive small vessel disease in the right foot. He struggles with chronic swelling. Recommend compression and elevation. Follow up with me in a year with repeat ABI. ? ?CHIEF COMPLAINT: right foot ulcer ? ?HISTORY OF PRESENT ILLNESS: ?[name] is a 71 y.o. male who presents to clinic for evaluation of peripheral arterial disease. He underwent intramedullary nail of the right tibia and removal of deep orthopedic hardware on 05/28/2020.  He developed an ulcer on his posterior heel shortly thereafter.  The ulcer has been slow to heal since that time.  The patient has also noticed a new wound develop on his right great toe.  Prior to his orthopedic difficulty, the patient reports a fairly classic history of claudication.  He has neuropathy in does not have much sensation in his feet.  He has an extensive past cardiovascular history.  He has been taken care of by Dr. Gwenlyn Found in the past. ? ?11/17/2020: Patient returns to clinic to review angiogram findings.  We discussed the findings in detail, and reviewed the images together.  I explained the limited options we have going forward.  Thankfully he appears to be healing. ? ?05/18/21: patient returns for surveillance. Thankfully he has healed his heel ulceration. He  struggles with chronic swelling in the leg. ABI reviewed.  ? ?Past Medical History:  ?Diagnosis Date  ? Abnormal stress test 10/27/2011  ? lat isch--cardiac cath 10/28/2011  ? Aortic stenosis, moderate Sept/Oct /2013  ? 2017-> s/p bioprosthetic AV [facility] Ease bovine pericardial valve model 3300 TFX, ser # [id number]).  ? 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 a. Inferolateral perfusion defect + EKG abnormality during stress testing prompted Cath by [facility]: 3V CAD, EF normal.  Med mgmt recommended; b. 06/2015 Cath: 3VD and mod AS; c. 07/2015 CABG x 3 (LIMA->LAD, VG->OM2, VG->PDA) w/ AVR.  ? Carotid stenosis, right 09/2011  ? Dr. Gwenlyn Found did R carotid stenting 01/2014.  Carotid dopplers 06/2015 essentially normal.  No change 07/2016--repeat 1 yr.  ? Complication of anesthesia   ? difficulty with intubation,   ? Decreased pedal pulses   ? LE doppler 11/08/11- no evidence of arterial insufficiency  ? Diabetes mellitus Dx'd age 14  ? Novolog via insulin pump; sub-optimal control x years  ? Diabetic neuropathy ([place])   ? fine touch and position sense affected  ? Diabetic retinopathy   ? Proliferative: Hx of retinal detachment on right-light perception only in right eye  ? Diastolic dysfunction [id number]; 2016  ? TEE with diastolic dysfunction, EF [id number].  ? Difficult intubation   ? ~ 2002 difficult fiberoptic intubation with takeback bleeding s/p thyroidectomy;  for thyroidectomy was intubated DL X 1 with cricoid pressure but difficult mask (full beard)  ? Dizziness   ? feels off balance  ? Erectile dysfunction   ? [place]  ? GERD (gastroesophageal reflux disease)   ? Headache   ? resolved  ? Heart murmur   ? History of tobacco abuse   ? Quit about 1990 (has 35 pack-yr hx)  ? Hyperlipemia, mixed   ? a. intolerant of statins/zeta; per Dr. Gwenlyn Found 12/2016, pt started on PCSK9--incomplete responder?---01/2017.  ? Hypothyroidism, postsurgical   ? Thyroidectomy 2002; multinodular goiter.  Dr. Cruzita Lederer managing this as of 10/2015.  ?  Impaired vision   ? right eye light perception only; hx of retinal detachment.  ? Impingement syndrome of right shoulder   ? 08/26/15 Pt got subacromial steroid injection by orthopedist (Dr. Tama Headings).  [facility] [place] f/u 01/2016--MRI showed RC tear + AC joint arthritis, arthroscopic surgery done 03/2016 (ortho-[place]).  ? Neuromuscular disorder ([place])   ? neuropathy  ? OSA (obstructive sleep apnea)   ? 06/2011 sleep study: moderate OSA, CPAP at 12 cm H2O.  ? Osteoarthritis   ? Bilat thumb carpometacarpal joints.  Ortho injected each thumb x 2 in 2017.  Also injected 03/14/17.  ? Recurrent pneumonia   ? Shortness of breath dyspnea   ? Stroke [facility])   ? TIA  ? TIA (transient ischemic attack) 2015/16  ? with R carotid dz: pt got R carotid stent 01/2014 by Dr. Gwenlyn Found and was put on dual antiplatelet therapy with ASA 325mg  and plavix 75mg  qd afterwards  ? TIA (transient ischemic attack) 05/2015  ? Left brain (right sided numbness + slurred speech)  Admitted for obs/workup 05/2015.  ? Venous insufficiency   ? Venous reflux 10/14/2011  ? venous doppler-R GSV continuous reflux throughout; too small for VNUS closure  ? ? ?Past Surgical History:  ?Procedure Laterality Date  ? ABDOMINAL AORTOGRAM W/LOWER EXTREMITY N/A 10/27/2020  ? Procedure: ABDOMINAL AORTOGRAM W/LOWER EXTREMITY;  Surgeon: Broadus John, MD;  Location: [place] [facility];  Service: Vascular;  Laterality: N/A;  ? ABI  01/2016  ? Normal.  Dr. Gwenlyn Found to repeat in 1 yr.  ? AORTIC VALVE REPLACEMENT N/A 07/09/2015  ? Bovine pericardial valve.  Procedure: AORTIC VALVE REPLACEMENT (AVR);  Surgeon: Melrose Nakayama, MD;  Location: [place];  Service: Open Heart Surgery;  Laterality: N/A;  ? CARDIAC CATHETERIZATION  10/2011  ? EF normal.  Diffuse 3 vessel CAD, no stents placed.  Medical mgmt per [facility].  ? CARDIAC CATHETERIZATION N/A 06/22/2015  ? Procedure: Right/Left Heart Cath and Coronary Angiography;  Surgeon: Lor[place]ta Harp, MD;  Location: [place] [facility];   Service: Cardiovascular;  Laterality: N/A;  ? carotid dopplers  06/30/15  ? Normal: repeat when clinically indicated per Dr. Gwenlyn Found  ? CAROTID STENT INSERTION Right 01/16/2014  ? Procedure: CAROTID STENT INSERTION;  Surgeon: Lor[place]ta Harp, MD;  Location: [facility] C[facility] LAB;  Service: Cardiovascular;  Laterality: Right;  ? CATARACT EXTRACTION    ? left  ? CORONARY ARTERY BYPASS GRAFT N/A 07/09/2015  ? Procedure: CORONARY ARTERY BYPASS GRAFTING (CABG)TIMES THREE USING LEFT INTERNAL MAMMARY ARTERY AND LEFT SAPHENOUS LEG VEIN HARVESTED ENDOSCOPICALLY;  Surgeon: Melrose Nakayama, MD;  Location: [place];  Service: Open Heart Surgery;  Laterality: N/A;  ? EYE SURGERY    ? Multiple laser surgeries for diabetic retinopathy, also cataract surgery OU.  ? HARDWARE REMOVAL Right 05/28/2020  ? Procedure: HARDWARE REMOVAL AND EXCHANGE RIGHT ANKLE;  Surgeon: Erle Crocker, MD;  Location: [place];  Service: Orthopedics;  Laterality: Right;  ? INTRAOPERATIVE TRANSESOPHAGEAL ECHOCARDIOGRAM N/A 07/09/2015  ? Procedure: INTRAOPERATIVE TRANSESOPHAGEAL  ECHOCARDIOGRAM;  Surgeon: Melrose Nakayama, MD;  Location: [place];  Service: Open Heart Surgery;  Laterality: N/A;  ? lasix eye    ? LEFT AND RIGHT HEART CATHETERIZATION WITH CORONARY ANGIOGRAM N/A 10/28/2011  ? Procedure: LEFT AND R0IHT HEART CATHETERIZATION WITH CORONARY ANGIOGRAM;  Surgeon: Troy Sine, MD;  Location: [facility] CATH LAB;  Service: Cardiovascular;  Laterality: N/A;  ? ORIF ANKLE FRACTURE Right 07/23/2019  ? Procedure: OPEN REDUCTION RIGHT ANKLE DISLOCATION WITH REPAIR, OPEN TREATMENT OF TIBIAL PLAFOND FRACTURE WITH LATERAL MALLEOLUS, OPEN TREATMENT OF SYNDESMOSIS;  Surgeon: Erle Crocker, MD;  Location: [place];  Service: Orthopedics;  Laterality: Right;  LENGTH OF SURGERY: 2.5 HOURS  ? RETINAL DETACHMENT SURGERY    ? Right eye  ? ROTATOR CUFF REPAIR W/ DISTAL CLAVICLE EXCISION Right 03/2016  ? Arthroscopic ([facility])  ? THYROID SURGERY  2002  ? TIBIA IM NAIL INSERTION  Right 05/28/2020  ? Procedure: INTRAMEDULLARY NAIL RIGHT TIBIA;  Surgeon: Erle Crocker, MD;  Location: [place];  Service: Orthopedics;  Laterality: Right;  ? TRANSTHORACIC ECHOCARDIOGRAM  10/2011;12/201

## 2021-05-18 ENCOUNTER — Ambulatory Visit (HOSPITAL_COMMUNITY)
Admission: RE | Admit: 2021-05-18 | Discharge: 2021-05-18 | Disposition: A | Discharge status: Home / Self Care | Payer: HMO | Source: Ambulatory Visit | Attending: Vascular Surgery | Admitting: Vascular Surgery

## 2021-05-18 ENCOUNTER — Encounter: Payer: Self-pay | Admitting: Vascular Surgery

## 2021-05-18 ENCOUNTER — Ambulatory Visit: Payer: HMO | Admitting: Vascular Surgery

## 2021-05-18 VITALS — BP 135/77 | HR 93 | Temp 98.4°F | Resp 20 | Ht 74.0 in | Wt 236.0 lb

## 2021-05-18 DIAGNOSIS — I739 Peripheral vascular disease, unspecified: Secondary | ICD-10-CM

## 2021-05-18 DIAGNOSIS — M7989 Other specified soft tissue disorders: Secondary | ICD-10-CM

## 2021-06-22 ENCOUNTER — Telehealth: Payer: Self-pay

## 2021-06-22 NOTE — Telephone Encounter (Signed)
   Pre-operative Risk Assessment    Patient Name: [name]  DOB: 26-Apr-1950 MRN: [id number]      Request for Surgical Clearance    Procedure:   Right Ulnar Neuroplasty at Wrist and Elbow, Open Carpal Tunnel Release, and Distal Nerve Transfer  Date of Surgery:  Clearance 07/09/21                                 Surgeon:  Milly Jakob, MD Surgeon's Group or Practice Name:  [facility] Phone number:  [phone] Fax number:  [phone] Lattie Corns   Type of Clearance Requested:   - Pharmacy:  Hold Clopidogrel (Plavix)     Type of Anesthesia:  MAC   Additional requests/questions:   Please advise on when patient should STOP taking Blood Thinner prior to surgery.  Signed, Elsie Lincoln Caine [place]field   06/22/2021, 8:53 AM

## 2021-06-22 NOTE — Telephone Encounter (Signed)
   Name: [name]  DOB: [birth year]  MRN: [id number]   Primary Cardiologist: Nanetta Batty, MD  Chart reviewed as part of pre-operative protocol coverage. We are asked for guidance to hold plavix prior to carpal tunnel release surgery.   Pt has a history of carotid artery stenting, PAD with critical limb ischemia with failed right SFA therapy, and several episodes of stroke/TIAs. He is on plavix and ASA for the above. He had CABG in 2017 in the setting of AVR. On recent echo, patient has new severe MR, awaiting repeat echo with possible referral to structural heart team.   He has not had a recent ACS event or PCI. From a cardiology standpoint, can hold plavix for 5 days, but recommend continuation of ASA without interruption. I also recommend seeking input on plavix hold from PCP/neurology and Dr. Sherral Hammers.    I will route this recommendation to the requesting party via Epic fax function and remove from pre-op pool. Please call with questions.  Roe Rutherford Deetra Booton, PA 06/22/2021, 11:48 AM

## 2021-08-03 ENCOUNTER — Encounter: Payer: Self-pay | Admitting: Vascular Surgery

## 2021-08-29 ENCOUNTER — Other Ambulatory Visit: Payer: Self-pay | Admitting: Internal Medicine

## 2021-09-01 ENCOUNTER — Telehealth: Payer: Self-pay | Admitting: Internal Medicine

## 2021-09-01 NOTE — Telephone Encounter (Signed)
*  STAT* If patient is at the pharmacy, call can be transferred to refill team.   1. Which medications need to be refilled? (please list name of each medication and dose if known) REPATHA PUSHTRONEX SYSTEM 420 MG/3.5ML SOCT  2. Which pharmacy/location (including street and city if local pharmacy) is medication to be sent to? [id number] [place], [place] - [address]  3. Do they need a 30 day or 90 day supply? 90 (patient is requesting a year worth)

## 2021-09-03 NOTE — Telephone Encounter (Signed)
Rx sent in another encounter

## 2021-10-05 ENCOUNTER — Ambulatory Visit (HOSPITAL_COMMUNITY): Payer: HMO | Attending: Cardiology

## 2021-10-05 DIAGNOSIS — I340 Nonrheumatic mitral (valve) insufficiency: Secondary | ICD-10-CM

## 2021-10-05 DIAGNOSIS — Z951 Presence of aortocoronary bypass graft: Secondary | ICD-10-CM

## 2021-10-05 DIAGNOSIS — I10 Essential (primary) hypertension: Secondary | ICD-10-CM | POA: Diagnosis not present

## 2021-10-05 LAB — ECHOCARDIOGRAM COMPLETE
AV Mean grad: 12 mmHg
AV Peak grad: 19.8 mmHg
Ao pk vel: 2.23 m/s
Area-P 1/2: 3.74 cm2
MV M vel: 5.6 m/s
MV Peak grad: 125.4 mmHg
Radius: 0.75 cm
S' Lateral: 3.6 cm

## 2021-10-05 MED ORDER — PERFLUTREN LIPID MICROSPHERE
1.0000 mL | INTRAVENOUS | Status: AC | PRN
  Administered 2021-10-05: 2 mL via INTRAVENOUS

## 2021-10-12 ENCOUNTER — Other Ambulatory Visit: Payer: Self-pay | Admitting: Cardiovascular Disease

## 2021-10-12 ENCOUNTER — Ambulatory Visit: Payer: HMO | Attending: Cardiovascular Disease | Admitting: Cardiovascular Disease

## 2021-10-12 ENCOUNTER — Encounter: Payer: Self-pay | Admitting: Cardiovascular Disease

## 2021-10-12 DIAGNOSIS — I6521 Occlusion and stenosis of right carotid artery: Secondary | ICD-10-CM

## 2021-10-12 DIAGNOSIS — I10 Essential (primary) hypertension: Secondary | ICD-10-CM

## 2021-10-12 DIAGNOSIS — I350 Nonrheumatic aortic (valve) stenosis: Secondary | ICD-10-CM

## 2021-10-12 DIAGNOSIS — Z951 Presence of aortocoronary bypass graft: Secondary | ICD-10-CM

## 2021-10-12 DIAGNOSIS — I739 Peripheral vascular disease, unspecified: Secondary | ICD-10-CM

## 2021-10-12 DIAGNOSIS — E782 Mixed hyperlipidemia: Secondary | ICD-10-CM

## 2021-10-12 DIAGNOSIS — R600 Localized edema: Secondary | ICD-10-CM

## 2021-10-12 NOTE — Assessment & Plan Note (Signed)
History of carotid artery disease status post right carotid stenting by myself 01/16/2014.  His most recent carotid Dopplers performed 03/19/2021 revealed this to be widely patent with no evidence of ICA stenosis.  This will be repeated on an annual basis.

## 2021-10-12 NOTE — Patient Instructions (Signed)
Medication Instructions:  Your physician recommends that you continue on your current medications as directed. Please refer to the Current Medication list given to you today.  *If you need a refill on your cardiac medications before your next appointment, please call your pharmacy*   Testing/Procedures: Your physician has requested that you have a lower extremity venous duplex. This test is an ultrasound of the veins in the legs. It looks at venous blood flow that carries blood from the heart to the legs. Allow one hour for a Lower Venous exam. There are no restrictions or special instructions. This procedure will be done at [place]   Your physician has requested that you have a carotid duplex. This test is an ultrasound of the carotid arteries in your neck. It looks at blood flow through these arteries that supply the brain with blood. Allow one hour for this exam. There are no restrictions or special instructions. To be done in April 2024. This procedure will be done at [place]  Your physician has requested that you have an echocardiogram. Echocardiography is a painless test that uses sound waves to create images of your heart. It provides your doctor with information about the size and shape of your heart and how well your heart's chambers and valves are working. This procedure takes approximately one hour. There are no restrictions for this procedure. To be done in October 2024. This procedure will be done at [address]    Follow-Up: At [facility], you and your health needs are our priority.  As part of our continuing mission to provide you with exceptional heart care, we have created designated Provider Care Teams.  These Care Teams include your primary Cardiologist (physician) and Advanced Practice Providers (APPs -  Physician Assistants and Nurse Practitioners) who all work together to provide you with the care you need, when  you need it.  We recommend signing up for the patient portal called "MyChart".  Sign up information is provided on this After Visit Summary.  MyChart is used to connect with patients for Virtual Visits (Telemedicine).  Patients are able to view lab/test results, encounter notes, upcoming appointments, etc.  Non-urgent messages can be sent to your provider as well.   To learn more about what you can do with MyChart, go to [email or web address].    Your next appointment:   6 month(s)  The format for your next appointment:   In Person  Provider:   Fabian Sharp, PA-C, Sande Rives, PA-C, Jory Sims, DNP, ANP, Almyra Deforest, PA-C, or Diona Browner, NP       Then, Quay Burow, MD will plan to see you again in 12 month(s).

## 2021-10-12 NOTE — Progress Notes (Signed)
10/12/2021 Para March   04/04/1950  [id number]  Primary Physician Podraza, Sindy Messing, PA-C Primary Cardiologist: Lorretta Harp MD FACP, [place], [place], [place]  HPI:  [name] is a 71 y.o.  mildly overweight married Caucasian male, father of 17 and grandfather to 3 grandchildren, I last saw him in the office 04/28/21.  He is accompanied by his wife [name] today.  He has a history of insulin-dependent diabetes on insulin pump, mild sleep apnea, and hyperlipidemia as well as mild aortic stenosis. He had a Myoview stress test that was abnormal which led to a heart catheterization performed by Dr. Claiborne Billings in my absence revealing 70% disease in all 3 vessels with preserved LV function and mild AS. He also had moderate right ICA stenosis. He was neurologically asymptomatic. He was placed on beta-blocker and Ranexa Carotid Dopplers showed stable moderate right ICA stenosis. He was admitted to [facility] with 2 sequential TIAs. Carotid Dopplers did show moderate right ICA stenosis. The patient was seen by Dr. Leonie Man, stroke neurologist, asked Dr. Bridgett Larsson, vascular surgeon for consultation. Dr. Bridgett Larsson ordered a CTA which showed high-grade ostial right internal carotid artery stenosis. I am asked to see the patient for consideration of carotid artery stenting. The patient was offered carotid endarterectomy. [name] apparently had a bad experience remotely being intubated at the time of the parathyroid operation and does not want to be reintubated again if at all possible making carotid stenting the best option for revascularization. I perform this on 01/16/14 successfully without complication. His follow-up Doppler 2 weeks later showed his carotid artery widely patent. Since I saw him 6 months ago he's been admitted with TIA type symptoms with negative workup. He also complains of chronic dizziness and increasing dyspnea on exertion. This recent 2-D echo showed increase in hisaortic stenosis  now to moderately severewith a peak gradient that has increased from 35 up to 52 mmHg. [name] underwent right and left heart cath by myself 06/22/15 revealing moderate aortic stenosis with three-vessel coronary artery disease. Essentially underwent elective aortic valve replacement with a Medtronic magna ease 23 mm pericardial tissue valve and bypass grafting X 3 on 07/09/15 with a LIMA to his LAD, vein to obtuse marginal branch #2 and to the PDA. He did well. Postoperatively. Follow-up 2-D echocardiogram performed 08/19/15 revealed normal LV function with a well functioning aortic bioprosthesis. He feels clinically improved. He has been completely asymptomatic since his CABG/AVR. He did have right rotator cuff surgery in March 2018.   He did have a wound on his right foot/critical ischemia and underwent angiography with attempt at endovascular therapy for limb salvage by Dr. Amil Amen unsuccessfully.  He has one-vessel runoff via peroneal.  No other tibial vessels 1 to the foot.  The wound is slowly healed with local wound care.   Since I saw him 6 months ago he did have a repeat 2D echocardiogram for evaluation of his much regurgitation 10/05/2021 revealing normal LV systolic function, well-functioning aortic bioprosthesis and mild to moderate MR.  He is completely asymptomatic.  Denies chest pain or shortness of breath.  His ambulation is mostly limited by his right foot.  He recently walked around the [facility] spine center with his 3 grandchildren.   Current Meds  Medication Sig   aspirin EC 81 MG tablet Take 81 mg by mouth daily with lunch.   clopidogrel (PLAVIX) 75 MG tablet TAKE 1 TABLET BY MOUTH EVERY DAY (Patient taking differently: Take 75  mg by mouth daily at 12 noon.)   dimenhyDRINATE (DRAMAMINE) 50 MG tablet Take 50 mg by mouth daily with lunch.   Evolocumab with Infusor ([place]) 420 MG/3.5ML SOCT INJECT 1 DOSE INTO THE SKIN EVERY 30 (THIRTY) DAYS.   Feverfew 380 MG  CAPS Take 380 mg by mouth daily with lunch. To prevent migraines   Insulin Human (INSULIN PUMP) SOLN Inject into the skin continuous. Basal rate .95/hr, current pump settings: 12am .7, 2am 1, 7am 1.1, 11am .9, 5pm .9.25, 11pm .85. Uses Humalog U-100 Insulin.   insulin lispro (HUMALOG) 100 UNIT/ML injection Use 60 units via insulin pump. (Patient taking differently: Use via insulin pump.)   levothyroxine (SYNTHROID) 137 MCG tablet Take 137 mcg by mouth every morning.   loperamide (IMODIUM A-D) 2 MG tablet Take 2 mg by mouth every other day.   losartan-hydrochlorothiazide (HYZAAR) 100-12.5 MG tablet Take 1 tablet by mouth daily at 12 noon.   Multiple Vitamin (MULTIVITAMIN WITH MINERALS) TABS tablet Take 1 tablet by mouth daily with lunch. Multivitamin for Men 50+   omeprazole (PRILOSEC OTC) 20 MG tablet Take 20 mg by mouth daily with lunch.   tamsulosin (FLOMAX) 0.4 MG CAPS capsule Take 0.4 mg by mouth daily.     Allergies  Allergen Reactions   Rosuvastatin Calcium Other (See Comments)    Problem with higher dosages (Lipitor caused memory issues), also caused leg pains and lethargy   Statins Other (See Comments)    Problem with higher dosages (Lipitor caused memory issues), also caused leg pains and lethargy   Tape Rash and Other (See Comments)    Adhesive tape.  (Paper tape OK)    Social History   Socioeconomic History   Marital status: Married    Spouse name: Not on file   Number of children: Not on file   Years of education: Not on file   Highest education level: Not on file  Occupational History   Not on file  Tobacco Use   Smoking status: Former    Types: Cigarettes    Quit date: 56    Years since quitting: 40.8   Smokeless tobacco: Never  Vaping Use   Vaping Use: Never used  Substance and Sexual Activity   Alcohol use: Not Currently    Alcohol/week: 0.0 standard drinks of alcohol   Drug use: No   Sexual activity: Not on file  Other Topics Concern   Not on file   Social History Narrative   Divorced x1, remarried.  Has 3 daughters from first marriage.   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   Distant tobacco abuse (35 pack-yr hx), quit in the late 1990s, no ETOH or drugs.   No exercise.   Social Determinants of Health   Financial Resource Strain: Not on file  Food Insecurity: Not on file  Transportation Needs: Not on file  Physical Activity: Not on file  Stress: Not on file  Social Connections: Not on file  Intimate Partner Violence: Not on file     Review of Systems: General: negative for chills, fever, night sweats or weight changes.  Cardiovascular: negative for chest pain, dyspnea on exertion, edema, orthopnea, palpitations, paroxysmal nocturnal dyspnea or shortness of breath Dermatological: negative for rash Respiratory: negative for cough or wheezing Urologic: negative for hematuria Abdominal: negative for nausea, vomiting, diarrhea, bright red blood per rectum, melena, or hematemesis  Neurologic: negative for visual changes, syncope, or dizziness All other systems reviewed and are otherwise negative except as noted above.    Blood pressure (!) 110/58, pulse 91, height 6\' 2"  (1.88 m), weight 244 lb 9.6 oz (110.9 kg), SpO2 98 %.  General appearance: alert and no distress Neck: no adenopathy, no carotid bruit, no JVD, supple, symmetrical, trachea midline, and thyroid not enlarged, symmetric, no tenderness/mass/nodules Lungs: clear to auscultation bilaterally Heart: regular rate and rhythm, S1, S2 normal, no murmur, click, rub or gallop Extremities: 2-3+ edema right lower extremity Pulses: Diminished pedal pulses Skin: Skin color, texture, turgor normal. No rashes or lesions Neurologic: Grossly normal  EKG sinus rhythm at 91 with LVH voltage and left axis deviation.  I personally reviewed this EKG.  ASSESSMENT AND PLAN:    Aortic stenosis, moderate History of moderate aortic stenosis status post aortic valve replacement with a bioprosthetic aortic valve (Medtronic Magna Ease 23 mm pericardial tissue valve) on 07/09/2015.  There was a question of severe MR on 2D echo.  Most recent 2D echo performed 10/05/2021 revealed normal LV function with mild to moderate MR.  Patient is completely asymptomatic.  I do not feel compelled to further evaluate this noninvasively at this time.  We will continue to follow him noninvasively.  Essential hypertension History of essential hypertension blood pressure measured today 110/58.  He is on Hyzaar.  Hyperlipidemia History of hyperlipidemia on Repatha and Crestor with lipid profile performed 03/15/2021 revealing total cholesterol of 123, LDL 45 and HDL 67.  PAD (peripheral artery disease) (HCC) History of critical limb ischemia with a slowly healing right heel ulcer followed by vein and vascular specialist.  Carotid stenosis, right History of carotid artery disease status post right carotid stenting by myself 01/16/2014.  His most recent carotid Dopplers performed 03/19/2021 revealed this to be widely patent with no evidence of ICA stenosis.  This will be repeated on an annual basis.  S/P CABG x 3 History of CAD status post coronary artery bypass grafting times 37/16/17 after cardiac catheterization by myself 06/22/2015 revealed moderate aortic stenosis with three-vessel coronary disease.  He had a LIMA to his LAD, vein to an obtuse marginal branch and to the PDA.  His postop course was on eventful.  He denies chest pain or shortness of breath.     Lorretta Harp MD FACP,FACC,FAHA, [facility] 10/12/2021 8:41 AM

## 2021-10-12 NOTE — Assessment & Plan Note (Signed)
History of CAD status post coronary artery bypass grafting times 37/16/17 after cardiac catheterization by myself 06/22/2015 revealed moderate aortic stenosis with three-vessel coronary disease.  He had a LIMA to his LAD, vein to an obtuse marginal branch and to the PDA.  His postop course was on eventful.  He denies chest pain or shortness of breath.

## 2021-10-12 NOTE — Assessment & Plan Note (Signed)
History of moderate aortic stenosis status post aortic valve replacement with a bioprosthetic aortic valve (Medtronic Magna Ease 23 mm pericardial tissue valve) on 07/09/2015.  There was a question of severe MR on 2D echo.  Most recent 2D echo performed 10/05/2021 revealed normal LV function with mild to moderate MR.  Patient is completely asymptomatic.  I do not feel compelled to further evaluate this noninvasively at this time.  We will continue to follow him noninvasively.

## 2021-10-12 NOTE — Assessment & Plan Note (Signed)
History of hyperlipidemia on Repatha and Crestor with lipid profile performed 03/15/2021 revealing total cholesterol of 123, LDL 45 and HDL 67.

## 2021-10-12 NOTE — Assessment & Plan Note (Signed)
History of essential hypertension blood pressure measured today 110/58.  He is on Hyzaar.

## 2021-10-12 NOTE — Assessment & Plan Note (Signed)
History of critical limb ischemia with a slowly healing right heel ulcer followed by vein and vascular specialist.

## 2021-10-18 ENCOUNTER — Ambulatory Visit (HOSPITAL_COMMUNITY)
Admission: RE | Admit: 2021-10-18 | Discharge: 2021-10-18 | Disposition: A | Discharge status: Home / Self Care | Payer: HMO | Source: Ambulatory Visit | Attending: Cardiovascular Disease | Admitting: Cardiovascular Disease

## 2021-10-18 DIAGNOSIS — Z951 Presence of aortocoronary bypass graft: Secondary | ICD-10-CM | POA: Diagnosis present

## 2021-10-18 DIAGNOSIS — I10 Essential (primary) hypertension: Secondary | ICD-10-CM | POA: Insufficient documentation

## 2021-10-18 DIAGNOSIS — R600 Localized edema: Secondary | ICD-10-CM | POA: Diagnosis present

## 2021-10-18 DIAGNOSIS — I739 Peripheral vascular disease, unspecified: Secondary | ICD-10-CM | POA: Insufficient documentation

## 2021-10-18 DIAGNOSIS — I6521 Occlusion and stenosis of right carotid artery: Secondary | ICD-10-CM | POA: Insufficient documentation

## 2021-10-18 DIAGNOSIS — E782 Mixed hyperlipidemia: Secondary | ICD-10-CM | POA: Diagnosis not present

## 2022-01-19 ENCOUNTER — Other Ambulatory Visit: Payer: Self-pay | Admitting: *Deleted

## 2022-01-19 DIAGNOSIS — E785 Hyperlipidemia, unspecified: Secondary | ICD-10-CM

## 2022-01-19 DIAGNOSIS — Z951 Presence of aortocoronary bypass graft: Secondary | ICD-10-CM

## 2022-01-28 LAB — LIPID PANEL
Chol/HDL Ratio: 2 ratio (ref 0.0–5.0)
Cholesterol, Total: 121 mg/dL (ref 100–199)
HDL: 61 mg/dL (ref >39)
LDL Chol Calc (NIH): 46 mg/dL (ref 0–99)
Triglycerides: 66 mg/dL (ref 0–149)
VLDL Cholesterol Cal: 14 mg/dL (ref 5–40)

## 2022-03-02 ENCOUNTER — Other Ambulatory Visit: Payer: Self-pay | Admitting: Internal Medicine

## 2022-03-17 ENCOUNTER — Telehealth: Payer: Self-pay | Admitting: Internal Medicine

## 2022-03-17 NOTE — Telephone Encounter (Signed)
Called pt back. He states he needs his prescription/pt assistance application renewed. Sent MyChart message with application link.

## 2022-03-17 NOTE — Telephone Encounter (Signed)
Pt c/o medication issue:  1. Name of Medication:   Evolocumab with Infusor ([place]) 420 MG/3.5ML SOCT    2. How are you currently taking this medication (dosage and times per day)? As prescribed.   3. Are you having a reaction (difficulty breathing--STAT)?   4. What is your medication issue? Patient needs a call back to get a new form sent for this prescription.

## 2022-04-04 ENCOUNTER — Encounter: Payer: Self-pay | Admitting: Internal Medicine

## 2022-04-04 ENCOUNTER — Ambulatory Visit: Payer: HMO | Attending: Internal Medicine | Admitting: Internal Medicine

## 2022-04-04 VITALS — BP 130/58 | HR 99 | Ht 74.0 in | Wt 252.8 lb

## 2022-04-04 DIAGNOSIS — Z951 Presence of aortocoronary bypass graft: Secondary | ICD-10-CM | POA: Diagnosis not present

## 2022-04-04 DIAGNOSIS — E785 Hyperlipidemia, unspecified: Secondary | ICD-10-CM

## 2022-04-04 DIAGNOSIS — I739 Peripheral vascular disease, unspecified: Secondary | ICD-10-CM | POA: Diagnosis not present

## 2022-04-04 MED ORDER — REPATHA PUSHTRONEX SYSTEM 420 MG/3.5ML ~~LOC~~ SOCT: 1.0000 | SUBCUTANEOUS | 3 refills | Status: DC

## 2022-04-04 NOTE — Patient Instructions (Signed)
Medication Instructions:  Your physician recommends that you continue on your current medications as directed. Please refer to the Current Medication list given to you today.  *If you need a refill on your cardiac medications before your next appointment, please call your pharmacy*   Lab Work: FASTING lab work to check cholesterol in 1 year  If you have labs (blood work) drawn today and your tests are completely normal, you will receive your results only by: MyChart Message (if you have MyChart) OR A paper copy in the mail If you have any lab test that is abnormal or we need to change your treatment, we will call you to review the results.   Follow-Up: At [facility], you and your health needs are our priority.  As part of our continuing mission to provide you with exceptional heart care, we have created designated Provider Care Teams.  These Care Teams include your primary Cardiologist (physician) and Advanced Practice Providers (APPs -  Physician Assistants and Nurse Practitioners) who all work together to provide you with the care you need, when you need it.  We recommend signing up for the patient portal called "MyChart".  Sign up information is provided on this After Visit Summary.  MyChart is used to connect with patients for Virtual Visits (Telemedicine).  Patients are able to view lab/test results, encounter notes, upcoming appointments, etc.  Non-urgent messages can be sent to your provider as well.   To learn more about what you can do with MyChart, go to [email or web address].    Your next appointment:    12 months with Dr. Hilty     

## 2022-04-04 NOTE — Progress Notes (Signed)
OFFICE NOTE  Chief Complaint:  Lipid clinic follow-up  Primary Care Physician: Jonathon Bellows, PA-C  HPI:  [name] is a 72 y.o. 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  He also underwent bovine pericardial aortic valve replacement at that time.  In addition he has a history of dyslipidemia with statin intolerance.  He reports taking high-dose atorvastatin and rosuvastatin which caused both myalgias, lethargy and memory problems.  In addition he said he took ezetimibe in the past which she thought also cause some memory problems.  He was referred for evaluation of PCSK9 inhibitor.  Actually he was recently started on Repatha and has had 5 doses of the medication.  Lipid profile 7 months ago indicated total cholesterol 245, triglycerides 113, HDL 58 and LDL 164.  His most recent lipid profile 2 weeks ago showed a total cholesterol 202, triglycerides 70, HDL 75 and LDL 113.  There is concerned about incomplete response to Repatha, but it should be noted that his calculated LDL was lower, but is misrepresented as his HDL fraction has gone up from 58-75.  That being said his actual LDL is probably higher than represented.  He does report compliance with the medication but he says the injector pen does cause him discomfort.  He also uses an insulin pump and is fairly used to needles.  02/07/2018  [name] is seen today in follow-up.  He is done well with the [place] Pushtronex system.  He is now using that once monthly.  He does get some occasional injection site redness with use but typically that improves after 2 days.  His cholesterol profile is also favorably responded to the medication.  His total cholesterol now as of January 12, 2018 is 176, HDL 72, LDL 94 and triglycerides 52.  He denies any other myalgias or other side effects that he had with statins.  He is  actually quite close to goal LDL less than 70.  Interestingly, he has not had a robust response as expected with the Repatha, namely a 60 to 65% reduction although does seem to have a little more benefit on the high once monthly dose.  He previously had been on the every 2 weekly Repatha dose with suboptimal response, suggesting that there may be PCSK9 or LDL mutation or perhaps a large fraction of his LDL is LP(a).  01/29/2019  [name] returns today for lipid clinic follow-up.  He was previously being seen by Dr. Alvester Chou however switched his care to Dr. Philbert Riser at [facility] due to a change in his insurance.  Currently he has [employer].  Despite this, he wishes to continue to follow here for lipid management.  Recently he had lipids drawn at [facility], those were in November 2020.  Total cholesterol was 150, triglycerides 58, HDL 68 and a direct LDL was 81.  He remains on the [place] Pushtronex system.  At some point he notes that he was started on Vascepa.  He was advised to take 2 g twice daily which is the recommended dose however he has been taking only 1 g daily.  After extensive review of his previous lipids however I do not see any evidence that he has had elevated triglycerides over 150, which means use of Vascepa is not based on any guideline directed indications or supported by current literature.  In fact  his low triglycerides are not predictive of further increase cardiovascular risk.  03/16/2020  [name] returns today for follow-up.  He is back to see Dr. Gwenlyn Found due to his insurance switch.  He seems to be doing well on the [place] Pushtronex system.  We will need to reauthorize that.  Hopefully we can get it to him at low to no cost.  We had previously discussed that he was not quite to target LDL.  His direct LDL was 81 however recent labs showed total cholesterol 182, triglycerides 80, HDL 61 and LDL 98.  Would like to see his LDL cholesterol below 100.  We discussed  possible additional therapies and I would prefer adding low-dose statin since most of the data for PCSK9 inhibitors were on top of statin therapy.  He had previously not been able to tolerate statins due to issues with memory and leg pains as well as lethargy, however this was at much higher doses  03/16/2021  [name] is seen today in follow-up.  He recently saw Dr. Gwenlyn Found as well who is ordered repeat Dopplers.  His cholesterol remains very well controlled in fact lower than it has been in the past.  Total now 123, triglycerides 47, HDL 67 LDL 45.  He seems to be doing well with Repatha Pushtronex system management of his rosuvastatin.  04/04/2022  [name] is seen today in follow-up.  Overall he is doing well.  He is tolerating the Repatha Pushtronex without issues.  He has been on low-dose rosuvastatin.  He thought recently that he may be having some worsening memory issues on that.  He says he stopped it for the past month but has not noticed a significant change in the memory issues.  He did have recent cholesterol testing.  Lipids in January, prior to stopping the rosuvastatin showed total cholesterol 121, triglycerides 66, HDL 61 and LDL 46.  PMHx:  Past Medical History:  Diagnosis Date   Abnormal stress test 10/27/2011   lat isch--cardiac cath 10/28/2011   Aortic stenosis, moderate Sept/Oct /2013   2017-> s/p bioprosthetic AV [facility] Ease bovine pericardial valve model 3300 TFX, ser # [id number]).   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 Inferolateral perfusion defect + EKG abnormality during stress testing prompted Cath by [facility]: 3V CAD, EF normal.  Med mgmt recommended; b. 06/2015 Cath: 3VD and mod AS; c. 07/2015 CABG x 3 (LIMA->LAD, VG->OM2, VG->PDA) w/ AVR.   Carotid stenosis, right 09/2011   Dr. Gwenlyn Found did R carotid stenting 01/2014.  Carotid dopplers 06/2015 essentially normal.  No change 07/2016--repeat 1 yr.   Complication of anesthesia    difficulty with intubation,    Decreased  pedal pulses    LE doppler 11/08/11- no evidence of arterial insufficiency   Diabetes mellitus Dx'd age 34   Novolog via insulin pump; sub-optimal control x years   Diabetic neuropathy    fine touch and position sense affected   Diabetic retinopathy    Proliferative: Hx of retinal detachment on right-light perception only in right eye   Diastolic dysfunction [id number]; 2016   TEE with diastolic dysfunction, EF [id number].   Difficult intubation    ~ 2002 difficult fiberoptic intubation with takeback bleeding s/p thyroidectomy;  for thyroidectomy was intubated DL X 1 with cricoid pressure but difficult mask (full beard)   Dizziness    feels off balance   Erectile dysfunction    [place]rolog[place]   GERD (gastroesophageal reflux disease)  Headache    resolved   Heart murmur    History of tobacco abuse    Quit about 1990 (has 35 pack-yr hx)   Hyperlipemia, mixed    a. intolerant of statins/zeta; per Dr. Gwenlyn Found 12/2016, pt started on PCSK9--incomplete responder?---01/2017.   Hypothyroidism, postsurgical    Thyroidectomy 2002; multinodular goiter.  Dr. Cruzita Lederer managing this as of 10/2015.   Impaired vision    right eye light perception only; hx of retinal detachment.   Impingement syndrome of right shoulder    08/26/15 Pt got subacromial steroid injection by orthopedist (Dr. Tama Headings).  [facility] f/u 01/2016--MRI showed RC tear + AC joint arthritis, arthroscopic surgery done 03/2016 ([facility]).   Neuromuscular disorder    neuropathy   OSA (obstructive sleep apnea)    06/2011 sleep study: moderate OSA, CPAP at 12 cm H2O.   Osteoarthritis    Bilat thumb carpometacarpal joints.  Ortho injected each thumb x 2 in 2017.  Also injected 03/14/17.   Recurrent pneumonia    Shortness of breath dyspnea    Stroke    TIA   TIA (transient ischemic attack) 2015/16   with R carotid dz: pt got R carotid stent 01/2014 by Dr. Gwenlyn Found and was put on dual antiplatelet therapy with ASA 325mg  and plavix  75mg  qd afterwards   TIA (transient ischemic attack) 05/2015   Left brain (right sided numbness + slurred speech)  Admitted for obs/workup 05/2015.   Venous insufficiency    Venous reflux 10/14/2011   venous doppler-R GSV continuous reflux throughout; too small for VNUS closure    Past Surgical History:  Procedure Laterality Date   ABDOMINAL AORTOGRAM W/LOWER EXTREMITY N/A 10/27/2020   Procedure: ABDOMINAL AORTOGRAM W/LOWER EXTREMITY;  Surgeon: Broadus John, MD;  Location: [facility];  Service: Vascular;  Laterality: N/A;   ABI  01/2016   Normal.  Dr. Gwenlyn Found to repeat in 1 yr.   AORTIC VALVE REPLACEMENT N/A 07/09/2015   Bovine pericardial valve.  Procedure: AORTIC VALVE REPLACEMENT (AVR);  Surgeon: Melrose Nakayama, MD;  Location: [facility];  Service: Open Heart Surgery;  Laterality: N/A;   CARDIAC CATHETERIZATION  10/2011   EF normal.  Diffuse 3 vessel CAD, no stents placed.  Medical mgmt per [facility].   CARDIAC CATHETERIZATION N/A 06/22/2015   Procedure: Right/Left Heart Cath and Coronary Angiography;  Surgeon: Lorretta Harp, MD;  Location: [facility];  Service: Cardiovascular;  Laterality: N/A;   carotid dopplers  06/30/15   Normal: repeat when clinically indicated per Dr. Gwenlyn Found   CAROTID STENT INSERTION Right 01/16/2014   Procedure: CAROTID STENT INSERTION;  Surgeon: Lorretta Harp, MD;  Location: [facility] CATH LAB;  Service: Cardiovascular;  Laterality: Right;   CATARACT EXTRACTION     left   CORONARY ARTERY BYPASS GRAFT N/A 07/09/2015   Procedure: CORONARY ARTERY BYPASS GRAFTING (CABG)TIMES THREE USING LEFT INTERNAL MAMMARY ARTERY AND LEFT SAPHENOUS LEG VEIN HARVESTED ENDOSCOPICALLY;  Surgeon: Melrose Nakayama, MD;  Location: [facility];  Service: Open Heart Surgery;  Laterality: N/A;   EYE SURGERY     Multiple laser surgeries for diabetic retinopathy, also cataract surgery OU.   HARDWARE REMOVAL Right 05/28/2020   Procedure: HARDWARE REMOVAL AND EXCHANGE RIGHT ANKLE;   Surgeon: Erle Crocker, MD;  Location: [place];  Service: Orthopedics;  Laterality: Right;   INTRAOPERATIVE TRANSESOPHAGEAL ECHOCARDIOGRAM N/A 07/09/2015   Procedure: INTRAOPERATIVE TRANSESOPHAGEAL ECHOCARDIOGRAM;  Surgeon: Melrose Nakayama, MD;  Location: [place];  Service:  Open Heart Surgery;  Laterality: N/A;   lasix eye     LEFT AND RIGHT HEART CATHETERIZATION WITH CORONARY ANGIOGRAM N/A 10/28/2011   Procedure: LEFT AND R0IHT HEART CATHETERIZATION WITH CORONARY ANGIOGRAM;  Surgeon: Troy Sine, MD;  Location: [facility] CATH LAB;  Service: Cardiovascular;  Laterality: N/A;   ORIF ANKLE FRACTURE Right 07/23/2019   Procedure: OPEN REDUCTION RIGHT ANKLE DISLOCATION WITH REPAIR, OPEN TREATMENT OF TIBIAL PLAFOND FRACTURE WITH LATERAL MALLEOLUS, OPEN TREATMENT OF SYNDESMOSIS;  Surgeon: Erle Crocker, MD;  Location: [place];  Service: Orthopedics;  Laterality: Right;  LENGTH OF SURGERY: 2.5 HOURS   RETINAL DETACHMENT SURGERY     Right eye   ROTATOR CUFF REPAIR W/ DISTAL CLAVICLE EXCISION Right 03/2016   Arthroscopic ([facility])   THYROID SURGERY  2002   TIBIA IM NAIL INSERTION Right 05/28/2020   Procedure: INTRAMEDULLARY NAIL RIGHT TIBIA;  Surgeon: Erle Crocker, MD;  Location: [place];  Service: Orthopedics;  Laterality: Right;   TRANSTHORACIC ECHOCARDIOGRAM  10/2011;12/2012;01/2015; 08/2015; 07/28/16   Mod AS, mild LVH, EF 60-65%, no RWMA.  01/2015 EF 60-65%, grade I DD, progression of mod/sev AS.  08/2015 normal lv fxn with normal fxning bioprosthetic Ao valve.  07/2016--no change compared to 2017 echo.      FAMHx:  Family History  Problem Relation Age of Onset   Cancer Mother        lung cancer   Alcohol abuse Father    Cancer Father        laryngeal cancer   Cancer Brother        oldest brother had lung cancer and melanoma    SOCHx:   reports that he quit smoking about 41 years ago. His smoking use included cigarettes. He has never used smokeless tobacco. He reports that he  does not currently use alcohol. He reports that he does not use drugs.  ALLERGIES:  Allergies  Allergen Reactions   Rosuvastatin Calcium Other (See Comments)    Problem with higher dosages (Lipitor caused memory issues), also caused leg pains and lethargy   Statins Other (See Comments)    Problem with higher dosages (Lipitor caused memory issues), also caused leg pains and lethargy   Tape Rash and Other (See Comments)    Adhesive tape.  (Paper tape OK)    ROS: Pertinent items noted in HPI and remainder of comprehensive ROS otherwise negative.  HOME MEDS: Current Outpatient Medications on File Prior to Visit  Medication Sig Dispense Refill   aspirin EC 81 MG tablet Take 81 mg by mouth daily with lunch.     clopidogrel (PLAVIX) 75 MG tablet TAKE 1 TABLET BY MOUTH EVERY DAY (Patient taking differently: Take 75 mg by mouth daily at 12 noon.) 90 tablet 3   clotrimazole-betamethasone (LOTRISONE) cream Apply 1 application  topically 2 (two) times daily as needed (itching/irritation).     dimenhyDRINATE (DRAMAMINE) 50 MG tablet Take 50 mg by mouth daily with lunch.     Evolocumab with Infusor ([place]) 420 MG/3.5ML SOCT INJECT 1 DOSE INTO THE SKIN EVERY 30 (THIRTY) DAYS. 10.5 mL 3   Feverfew 380 MG CAPS Take 380 mg by mouth daily with lunch. To prevent migraines     Insulin Human (INSULIN PUMP) SOLN Inject into the skin continuous. Basal rate .95/hr, current pump settings: 12am .7, 2am 1, 7am 1.1, 11am .9, 5pm .9.25, 11pm .85. Uses Humalog U-100 Insulin.     insulin lispro (HUMALOG) 100 UNIT/ML injection Use 60 units via insulin  pump. (Patient taking differently: Use via insulin pump.) 90 mL 2   levothyroxine (SYNTHROID) 137 MCG tablet Take 137 mcg by mouth every morning.     loperamide (IMODIUM A-D) 2 MG tablet Take 2 mg by mouth every other day.     losartan-hydrochlorothiazide (HYZAAR) 100-12.5 MG tablet Take 1 tablet by mouth daily at 12 noon.     Multiple Vitamin  (MULTIVITAMIN WITH MINERALS) TABS tablet Take 1 tablet by mouth daily with lunch. Multivitamin for Men 50+     omeprazole (PRILOSEC OTC) 20 MG tablet Take 20 mg by mouth daily with lunch.     rosuvastatin (CRESTOR) 5 MG tablet TAKE 1 TABLET (5 MG TOTAL) BY MOUTH DAILY. 90 tablet 3   tamsulosin (FLOMAX) 0.4 MG CAPS capsule Take 0.4 mg by mouth daily.     No current facility-administered medications on file prior to visit.    LABS/IMAGING: No results found for this or any previous visit (from the past 48 hour(s)).  No results found.  LIPID PANEL:    Component Value Date/Time   CHOL 121 01/28/2022 0804   TRIG 66 01/28/2022 0804   HDL 61 01/28/2022 0804   CHOLHDL 2.0 01/28/2022 0804   CHOLHDL 4.0 05/07/2015 0557   VLDL 15 05/07/2015 0557   LDLCALC 46 01/28/2022 0804   LDLDIRECT 159.5 04/21/2011 0843     WEIGHTS: Wt Readings from Last 3 Encounters:  04/04/22 252 lb 12.8 oz (114.7 kg)  10/12/21 244 lb 9.6 oz (110.9 kg)  05/18/21 236 lb (107 kg)    VITALS: BP (!) 130/58   Pulse 99   Ht 6\' 2"  (1.88 m)   Wt 252 lb 12.8 oz (114.7 kg)   SpO2 97%   BMI 32.46 kg/m   EXAM: Deferred  EKG: Deferred  ASSESSMENT: Mixed dyslipidemia, goal LDL less than 70 ASCVD status post 3 vessel CABG and bioprosthetic AVR (2017) Statin intolerance - myalgias PAD IDDM  PLAN: 1.   [name] continues to have well treated cholesterol on his current regimen.  He has taken a short holiday off the statin to see if it had any effect on his memory but is not clearly changed.  I advised him to consider going back on it but to notify me of ultimately what he decides.  Otherwise we will plan a follow-up with repeat lipids annually or sooner as necessary.  He did apply for his health well grant again.  Follow-up annually or sooner as necessary.  Pixie Casino, MD, [facility], [place] Director of the Advanced Lipid Disorders &  Cardiovascular Risk Reduction  Clinic Diplomate of the American Board of Clinical Lipidology Attending Cardiologist  Direct Dial: [phone]  Fax: [phone]  Website:  [email or web address] Esha Fincher 04/04/2022, 3:02 PM

## 2022-04-15 ENCOUNTER — Telehealth: Payer: Self-pay | Admitting: Cardiovascular Disease

## 2022-04-15 NOTE — Telephone Encounter (Signed)
Diabetic sore on heel, since June; no infection. The vascular provider said he would be a candidate for hyperbaric oxygen chamber therapy. It would be about 30 sessions. Said that they would need to work hand in hand/need clearance from cardiologist to make sure that he can receive this therapy.   Told him that I would send this information to Dr [name].

## 2022-04-15 NOTE — Telephone Encounter (Signed)
Left voicemail for patient to return call to office. 

## 2022-04-15 NOTE — Telephone Encounter (Signed)
Patient is returning call. Please advise? 

## 2022-04-15 NOTE — Telephone Encounter (Signed)
Patient states he has a diabetic sore on foot and one of the options for healing this may be questionable given his heart history. Requesting call back to discuss his options. Please advise.

## 2022-04-15 NOTE — Telephone Encounter (Signed)
Nathaniel Gess, MD  You30 minutes ago (12:46 PM)   I have no issues with him undergoing hyperbaric oxygen therapy.   Gave the patient the message above. He verbalized understanding

## 2022-04-18 ENCOUNTER — Encounter (HOSPITAL_BASED_OUTPATIENT_CLINIC_OR_DEPARTMENT_OTHER): Payer: PPO | Admitting: Internal Medicine

## 2022-04-20 ENCOUNTER — Ambulatory Visit (HOSPITAL_COMMUNITY)
Admission: RE | Admit: 2022-04-20 | Discharge: 2022-04-20 | Disposition: A | Discharge status: Home / Self Care | Payer: PPO | Source: Ambulatory Visit | Attending: Cardiovascular Disease | Admitting: Cardiovascular Disease

## 2022-04-20 DIAGNOSIS — I6523 Occlusion and stenosis of bilateral carotid arteries: Secondary | ICD-10-CM | POA: Insufficient documentation

## 2022-05-24 ENCOUNTER — Other Ambulatory Visit (HOSPITAL_COMMUNITY): Payer: Self-pay | Admitting: Cardiovascular Disease

## 2022-05-24 DIAGNOSIS — I6522 Occlusion and stenosis of left carotid artery: Secondary | ICD-10-CM

## 2022-07-06 ENCOUNTER — Other Ambulatory Visit: Payer: Self-pay

## 2022-07-06 DIAGNOSIS — E1059 Type 1 diabetes mellitus with other circulatory complications: Secondary | ICD-10-CM | POA: Diagnosis not present

## 2022-07-06 DIAGNOSIS — I130 Hypertensive heart and chronic kidney disease with heart failure and stage 1 through stage 4 chronic kidney disease, or unspecified chronic kidney disease: Secondary | ICD-10-CM | POA: Diagnosis not present

## 2022-07-06 DIAGNOSIS — Z8679 Personal history of other diseases of the circulatory system: Secondary | ICD-10-CM | POA: Diagnosis not present

## 2022-07-06 DIAGNOSIS — N1831 Chronic kidney disease, stage 3a: Secondary | ICD-10-CM | POA: Diagnosis not present

## 2022-07-06 DIAGNOSIS — Z87891 Personal history of nicotine dependence: Secondary | ICD-10-CM | POA: Diagnosis not present

## 2022-07-06 DIAGNOSIS — E669 Obesity, unspecified: Secondary | ICD-10-CM | POA: Insufficient documentation

## 2022-07-06 DIAGNOSIS — E1022 Type 1 diabetes mellitus with diabetic chronic kidney disease: Secondary | ICD-10-CM | POA: Diagnosis not present

## 2022-07-06 DIAGNOSIS — M87077 Idiopathic aseptic necrosis of right toe(s): Secondary | ICD-10-CM | POA: Diagnosis present

## 2022-07-06 DIAGNOSIS — I2510 Atherosclerotic heart disease of native coronary artery without angina pectoris: Secondary | ICD-10-CM | POA: Diagnosis not present

## 2022-07-06 DIAGNOSIS — Z7902 Long term (current) use of antithrombotics/antiplatelets: Secondary | ICD-10-CM | POA: Diagnosis not present

## 2022-07-06 DIAGNOSIS — N400 Enlarged prostate without lower urinary tract symptoms: Secondary | ICD-10-CM | POA: Diagnosis not present

## 2022-07-06 DIAGNOSIS — Z6831 Body mass index (BMI) 31.0-31.9, adult: Secondary | ICD-10-CM | POA: Diagnosis not present

## 2022-07-06 DIAGNOSIS — E10621 Type 1 diabetes mellitus with foot ulcer: Secondary | ICD-10-CM | POA: Diagnosis not present

## 2022-07-06 DIAGNOSIS — Z794 Long term (current) use of insulin: Secondary | ICD-10-CM | POA: Diagnosis not present

## 2022-07-06 DIAGNOSIS — E1040 Type 1 diabetes mellitus with diabetic neuropathy, unspecified: Secondary | ICD-10-CM | POA: Diagnosis not present

## 2022-07-06 DIAGNOSIS — E890 Postprocedural hypothyroidism: Secondary | ICD-10-CM | POA: Insufficient documentation

## 2022-07-06 DIAGNOSIS — Z7982 Long term (current) use of aspirin: Secondary | ICD-10-CM | POA: Diagnosis not present

## 2022-07-06 DIAGNOSIS — Z79899 Other long term (current) drug therapy: Secondary | ICD-10-CM | POA: Diagnosis not present

## 2022-07-06 DIAGNOSIS — Z951 Presence of aortocoronary bypass graft: Secondary | ICD-10-CM | POA: Diagnosis not present

## 2022-07-06 DIAGNOSIS — Z8673 Personal history of transient ischemic attack (TIA), and cerebral infarction without residual deficits: Secondary | ICD-10-CM | POA: Diagnosis not present

## 2022-07-06 DIAGNOSIS — I5032 Chronic diastolic (congestive) heart failure: Secondary | ICD-10-CM | POA: Insufficient documentation

## 2022-07-06 DIAGNOSIS — L97519 Non-pressure chronic ulcer of other part of right foot with unspecified severity: Secondary | ICD-10-CM | POA: Diagnosis not present

## 2022-07-06 NOTE — ED Triage Notes (Signed)
Pt reports hx of DM type 1 with wound to right great toe x 1 month, worsening from a blister to black in color in the past couple of days

## 2022-07-07 ENCOUNTER — Emergency Department (HOSPITAL_BASED_OUTPATIENT_CLINIC_OR_DEPARTMENT_OTHER): Payer: PPO | Admitting: Radiology

## 2022-07-07 ENCOUNTER — Other Ambulatory Visit: Payer: Self-pay

## 2022-07-07 ENCOUNTER — Observation Stay (HOSPITAL_BASED_OUTPATIENT_CLINIC_OR_DEPARTMENT_OTHER)
Admission: EM | Admit: 2022-07-07 | Discharge: 2022-07-07 | Payer: PPO | Attending: Internal Medicine | Admitting: Internal Medicine

## 2022-07-07 ENCOUNTER — Encounter (HOSPITAL_BASED_OUTPATIENT_CLINIC_OR_DEPARTMENT_OTHER): Payer: Self-pay | Admitting: Emergency Medicine

## 2022-07-07 ENCOUNTER — Observation Stay (HOSPITAL_COMMUNITY): Payer: PPO

## 2022-07-07 DIAGNOSIS — E11628 Type 2 diabetes mellitus with other skin complications: Secondary | ICD-10-CM | POA: Diagnosis not present

## 2022-07-07 DIAGNOSIS — N1831 Chronic kidney disease, stage 3a: Secondary | ICD-10-CM | POA: Diagnosis present

## 2022-07-07 DIAGNOSIS — I10 Essential (primary) hypertension: Secondary | ICD-10-CM | POA: Diagnosis present

## 2022-07-07 DIAGNOSIS — I2510 Atherosclerotic heart disease of native coronary artery without angina pectoris: Secondary | ICD-10-CM | POA: Diagnosis not present

## 2022-07-07 DIAGNOSIS — L97519 Non-pressure chronic ulcer of other part of right foot with unspecified severity: Secondary | ICD-10-CM | POA: Diagnosis not present

## 2022-07-07 DIAGNOSIS — N400 Enlarged prostate without lower urinary tract symptoms: Secondary | ICD-10-CM | POA: Diagnosis not present

## 2022-07-07 DIAGNOSIS — L089 Local infection of the skin and subcutaneous tissue, unspecified: Secondary | ICD-10-CM | POA: Diagnosis not present

## 2022-07-07 DIAGNOSIS — E785 Hyperlipidemia, unspecified: Secondary | ICD-10-CM | POA: Diagnosis present

## 2022-07-07 DIAGNOSIS — Z8673 Personal history of transient ischemic attack (TIA), and cerebral infarction without residual deficits: Secondary | ICD-10-CM | POA: Diagnosis not present

## 2022-07-07 DIAGNOSIS — Z79899 Other long term (current) drug therapy: Secondary | ICD-10-CM | POA: Diagnosis not present

## 2022-07-07 DIAGNOSIS — M87077 Idiopathic aseptic necrosis of right toe(s): Secondary | ICD-10-CM | POA: Diagnosis present

## 2022-07-07 DIAGNOSIS — I5032 Chronic diastolic (congestive) heart failure: Secondary | ICD-10-CM | POA: Diagnosis not present

## 2022-07-07 DIAGNOSIS — E1040 Type 1 diabetes mellitus with diabetic neuropathy, unspecified: Secondary | ICD-10-CM | POA: Diagnosis not present

## 2022-07-07 DIAGNOSIS — Z951 Presence of aortocoronary bypass graft: Secondary | ICD-10-CM

## 2022-07-07 DIAGNOSIS — I739 Peripheral vascular disease, unspecified: Secondary | ICD-10-CM | POA: Diagnosis present

## 2022-07-07 DIAGNOSIS — Z6831 Body mass index (BMI) 31.0-31.9, adult: Secondary | ICD-10-CM | POA: Diagnosis not present

## 2022-07-07 DIAGNOSIS — Z7982 Long term (current) use of aspirin: Secondary | ICD-10-CM | POA: Diagnosis not present

## 2022-07-07 DIAGNOSIS — Z87891 Personal history of nicotine dependence: Secondary | ICD-10-CM | POA: Diagnosis not present

## 2022-07-07 DIAGNOSIS — I130 Hypertensive heart and chronic kidney disease with heart failure and stage 1 through stage 4 chronic kidney disease, or unspecified chronic kidney disease: Secondary | ICD-10-CM | POA: Diagnosis not present

## 2022-07-07 DIAGNOSIS — E1059 Type 1 diabetes mellitus with other circulatory complications: Secondary | ICD-10-CM | POA: Diagnosis not present

## 2022-07-07 DIAGNOSIS — E1022 Type 1 diabetes mellitus with diabetic chronic kidney disease: Secondary | ICD-10-CM | POA: Diagnosis not present

## 2022-07-07 DIAGNOSIS — E890 Postprocedural hypothyroidism: Secondary | ICD-10-CM | POA: Diagnosis present

## 2022-07-07 DIAGNOSIS — E669 Obesity, unspecified: Secondary | ICD-10-CM | POA: Diagnosis not present

## 2022-07-07 DIAGNOSIS — Z7902 Long term (current) use of antithrombotics/antiplatelets: Secondary | ICD-10-CM | POA: Diagnosis not present

## 2022-07-07 DIAGNOSIS — Z794 Long term (current) use of insulin: Secondary | ICD-10-CM | POA: Diagnosis not present

## 2022-07-07 DIAGNOSIS — E10621 Type 1 diabetes mellitus with foot ulcer: Secondary | ICD-10-CM | POA: Diagnosis not present

## 2022-07-07 DIAGNOSIS — Z8679 Personal history of other diseases of the circulatory system: Secondary | ICD-10-CM | POA: Diagnosis not present

## 2022-07-07 LAB — CBC WITH DIFFERENTIAL/PLATELET
Abs Immature Granulocytes: 0.04 10*3/uL (ref 0.00–0.07)
Basophils Absolute: 0 10*3/uL (ref 0.0–0.1)
Basophils Relative: 1 %
Eosinophils Absolute: 0.3 10*3/uL (ref 0.0–0.5)
Eosinophils Relative: 5 %
HCT: 31.3 % — ABNORMAL LOW (ref 39.0–52.0)
Hemoglobin: 10.1 g/dL — ABNORMAL LOW (ref 13.0–17.0)
Immature Granulocytes: 1 %
Lymphocytes Relative: 14 %
Lymphs Abs: 1.1 10*3/uL (ref 0.7–4.0)
MCH: 28.3 pg (ref 26.0–34.0)
MCHC: 32.3 g/dL (ref 30.0–36.0)
MCV: 87.7 fL (ref 80.0–100.0)
Monocytes Absolute: 0.5 10*3/uL (ref 0.1–1.0)
Monocytes Relative: 6 %
Neutro Abs: 5.6 10*3/uL (ref 1.7–7.7)
Neutrophils Relative %: 73 %
Platelets: 295 10*3/uL (ref 150–400)
RBC: 3.57 MIL/uL — ABNORMAL LOW (ref 4.22–5.81)
RDW: 13.5 % (ref 11.5–15.5)
WBC: 7.5 10*3/uL (ref 4.0–10.5)
nRBC: 0 % (ref 0.0–0.2)

## 2022-07-07 LAB — GLUCOSE, CAPILLARY
Glucose-Capillary: 132 mg/dL — ABNORMAL HIGH (ref 70–99)
Glucose-Capillary: 213 mg/dL — ABNORMAL HIGH (ref 70–99)
Glucose-Capillary: 206 mg/dL — ABNORMAL HIGH (ref 70–99)

## 2022-07-07 LAB — BASIC METABOLIC PANEL
Anion gap: 7 (ref 5–15)
BUN: 26 mg/dL — ABNORMAL HIGH (ref 8–23)
CO2: 25 mmol/L (ref 22–32)
Calcium: 8.3 mg/dL — ABNORMAL LOW (ref 8.9–10.3)
Chloride: 101 mmol/L (ref 98–111)
Creatinine, Ser: 1.51 mg/dL — ABNORMAL HIGH (ref 0.61–1.24)
GFR, Estimated: 48 mL/min — ABNORMAL LOW (ref >60)
Glucose, Bld: 270 mg/dL — ABNORMAL HIGH (ref 70–99)
Potassium: 4.5 mmol/L (ref 3.5–5.1)
Sodium: 133 mmol/L — ABNORMAL LOW (ref 135–145)

## 2022-07-07 LAB — PREALBUMIN: Prealbumin: 18 mg/dL (ref 18–38)

## 2022-07-07 LAB — HEMOGLOBIN A1C
Hgb A1c MFr Bld: 7.3 % — ABNORMAL HIGH (ref 4.8–5.6)
Mean Plasma Glucose: 162.81 mg/dL

## 2022-07-07 LAB — SEDIMENTATION RATE: Sed Rate: 73 mm/hr — ABNORMAL HIGH (ref 0–16)

## 2022-07-07 LAB — C-REACTIVE PROTEIN: CRP: 3.7 mg/dL — ABNORMAL HIGH (ref <1.0)

## 2022-07-07 MED ORDER — VANCOMYCIN HCL 1250 MG/250ML IV SOLN: 1250.0000 mg | INTRAVENOUS | Status: DC

## 2022-07-07 MED ORDER — POLYETHYLENE GLYCOL 3350 17 G PO PACK: 17.0000 g | PACK | Freq: Every day | ORAL | 0 refills | Status: DC | PRN

## 2022-07-07 MED ORDER — OMEPRAZOLE MAGNESIUM 20 MG PO TBEC: 20.0000 mg | DELAYED_RELEASE_TABLET | Freq: Every day | ORAL | Status: DC

## 2022-07-07 MED ORDER — PANTOPRAZOLE SODIUM 40 MG PO TBEC
40.0000 mg | DELAYED_RELEASE_TABLET | Freq: Every day | ORAL | Status: DC
  Administered 2022-07-07: 40 mg via ORAL
  Filled 2022-07-07: qty 1

## 2022-07-07 MED ORDER — DOCUSATE SODIUM 100 MG PO CAPS
100.0000 mg | ORAL_CAPSULE | Freq: Two times a day (BID) | ORAL | Status: DC
  Administered 2022-07-07 (×2): 100 mg via ORAL
  Filled 2022-07-07 (×2): qty 1

## 2022-07-07 MED ORDER — HEPARIN SODIUM (PORCINE) 5000 UNIT/ML IJ SOLN
5000.0000 [IU] | Freq: Three times a day (TID) | INTRAMUSCULAR | Status: DC
  Administered 2022-07-07 (×2): 5000 [IU] via SUBCUTANEOUS
  Filled 2022-07-07 (×2): qty 1

## 2022-07-07 MED ORDER — DOCUSATE SODIUM 100 MG PO CAPS: 100.0000 mg | ORAL_CAPSULE | Freq: Two times a day (BID) | ORAL | 0 refills | Status: DC

## 2022-07-07 MED ORDER — ACETAMINOPHEN 650 MG RE SUPP: 650.0000 mg | Freq: Four times a day (QID) | RECTAL | Status: DC | PRN

## 2022-07-07 MED ORDER — ONDANSETRON HCL 4 MG/2ML IJ SOLN: 4.0000 mg | Freq: Four times a day (QID) | INTRAMUSCULAR | 0 refills | Status: DC | PRN

## 2022-07-07 MED ORDER — ACETAMINOPHEN 325 MG PO TABS: 650.0000 mg | ORAL_TABLET | Freq: Four times a day (QID) | ORAL | Status: DC | PRN

## 2022-07-07 MED ORDER — CLOPIDOGREL BISULFATE 75 MG PO TABS
75.0000 mg | ORAL_TABLET | Freq: Every day | ORAL | Status: DC
  Administered 2022-07-07: 75 mg via ORAL
  Filled 2022-07-07: qty 1

## 2022-07-07 MED ORDER — PANTOPRAZOLE SODIUM 40 MG PO TBEC: 40.0000 mg | DELAYED_RELEASE_TABLET | Freq: Every day | ORAL | Status: DC

## 2022-07-07 MED ORDER — HEPARIN SODIUM (PORCINE) 5000 UNIT/ML IJ SOLN: 5000.0000 [IU] | Freq: Three times a day (TID) | INTRAMUSCULAR | Status: DC

## 2022-07-07 MED ORDER — SODIUM CHLORIDE 0.9 % IV SOLN: 2.0000 g | INTRAVENOUS | Status: DC

## 2022-07-07 MED ORDER — METRONIDAZOLE 500 MG/100ML IV SOLN: 500.0000 mg | Freq: Three times a day (TID) | INTRAVENOUS | Status: DC

## 2022-07-07 MED ORDER — POLYETHYLENE GLYCOL 3350 17 G PO PACK: 17.0000 g | PACK | Freq: Every day | ORAL | Status: DC | PRN

## 2022-07-07 MED ORDER — ONDANSETRON HCL 4 MG PO TABS: 4.0000 mg | ORAL_TABLET | Freq: Four times a day (QID) | ORAL | 0 refills | Status: DC | PRN

## 2022-07-07 MED ORDER — VANCOMYCIN HCL 500 MG/100ML IV SOLN
500.0000 mg | Freq: Once | INTRAVENOUS | Status: AC
  Administered 2022-07-07: 500 mg via INTRAVENOUS
  Filled 2022-07-07: qty 100

## 2022-07-07 MED ORDER — ONDANSETRON HCL 4 MG PO TABS: 4.0000 mg | ORAL_TABLET | Freq: Four times a day (QID) | ORAL | Status: DC | PRN

## 2022-07-07 MED ORDER — ONDANSETRON HCL 4 MG/2ML IJ SOLN: 4.0000 mg | Freq: Four times a day (QID) | INTRAMUSCULAR | Status: DC | PRN

## 2022-07-07 MED ORDER — HYDRALAZINE HCL 20 MG/ML IJ SOLN: 5.0000 mg | INTRAMUSCULAR | Status: DC | PRN

## 2022-07-07 MED ORDER — METRONIDAZOLE 500 MG/100ML IV SOLN
500.0000 mg | Freq: Once | INTRAVENOUS | Status: AC
  Administered 2022-07-07: 500 mg via INTRAVENOUS
  Filled 2022-07-07 (×2): qty 100

## 2022-07-07 MED ORDER — TAMSULOSIN HCL 0.4 MG PO CAPS
0.4000 mg | ORAL_CAPSULE | Freq: Every day | ORAL | Status: DC
  Administered 2022-07-07: 0.4 mg via ORAL
  Filled 2022-07-07: qty 1

## 2022-07-07 MED ORDER — VANCOMYCIN HCL IN DEXTROSE 1-5 GM/200ML-% IV SOLN
1000.0000 mg | Freq: Once | INTRAVENOUS | Status: AC
  Administered 2022-07-07: 1000 mg via INTRAVENOUS
  Filled 2022-07-07: qty 200

## 2022-07-07 MED ORDER — BISACODYL 5 MG PO TBEC: 5.0000 mg | DELAYED_RELEASE_TABLET | Freq: Every day | ORAL | 0 refills | Status: DC | PRN

## 2022-07-07 MED ORDER — ROSUVASTATIN CALCIUM 5 MG PO TABS: 5.0000 mg | ORAL_TABLET | Freq: Every day | ORAL | 3 refills | Status: DC

## 2022-07-07 MED ORDER — SODIUM CHLORIDE 0.9 % IV SOLN
1.0000 g | Freq: Once | INTRAVENOUS | Status: AC
  Administered 2022-07-07: 1 g via INTRAVENOUS
  Filled 2022-07-07: qty 10

## 2022-07-07 MED ORDER — CLOPIDOGREL BISULFATE 75 MG PO TABS: 75.0000 mg | ORAL_TABLET | Freq: Every day | ORAL | Status: AC

## 2022-07-07 MED ORDER — LEVOTHYROXINE SODIUM 25 MCG PO TABS
137.0000 ug | ORAL_TABLET | Freq: Every day | ORAL | Status: DC
  Administered 2022-07-07: 137 ug via ORAL
  Filled 2022-07-07: qty 1

## 2022-07-07 MED ORDER — INSULIN PUMP
Freq: Three times a day (TID) | SUBCUTANEOUS | Status: DC
  Filled 2022-07-07: qty 1

## 2022-07-07 MED ORDER — GADOBUTROL 1 MMOL/ML IV SOLN
10.0000 mL | Freq: Once | INTRAVENOUS | Status: AC | PRN
  Administered 2022-07-07: 10 mL via INTRAVENOUS

## 2022-07-07 MED ORDER — METRONIDAZOLE 500 MG/100ML IV SOLN
500.0000 mg | Freq: Three times a day (TID) | INTRAVENOUS | Status: DC
  Administered 2022-07-07 (×2): 500 mg via INTRAVENOUS
  Filled 2022-07-07 (×2): qty 100

## 2022-07-07 MED ORDER — ROSUVASTATIN CALCIUM 5 MG PO TABS
5.0000 mg | ORAL_TABLET | Freq: Every day | ORAL | Status: DC
  Filled 2022-07-07: qty 1

## 2022-07-07 MED ORDER — BISACODYL 5 MG PO TBEC: 5.0000 mg | DELAYED_RELEASE_TABLET | Freq: Every day | ORAL | Status: DC | PRN

## 2022-07-07 MED ORDER — SODIUM CHLORIDE 0.9 % IV SOLN: INTRAVENOUS | Status: DC

## 2022-07-07 MED ORDER — ASPIRIN 81 MG PO TBEC
81.0000 mg | DELAYED_RELEASE_TABLET | Freq: Every day | ORAL | Status: DC
  Administered 2022-07-07: 81 mg via ORAL
  Filled 2022-07-07: qty 1

## 2022-07-07 NOTE — H&P (Signed)
History and Physical    Patient: [name] [id number] DOB: November 16, 1950 DOA: 07/07/2022 DOS: the patient was seen and examined on 07/07/2022 PCP: Bailey Mech, PA-C  Patient coming from: Home - lives with wife; NOK: Wife, [phone]   Chief Complaint: Toe infection  HPI: [name] is a 72 y.o. male with medical history significant of CAD s/p CABG, DM, PAD (R carotid stent), chronic diastolic CHF, HTN, HLD, hypothyroidism, and TIA presenting with toe necrosis.  He reports that he is T1DM and has had CABG and AVR.  About a month ago, he had a large blood blister on the end of his R great toe.  He has neuropathy and can't really feel feet.  His wife has been taking care of it.  He saw his podiatrist last week and it looked crusty on top and was told to keep it dry.  This week, it started looking bad, ingrown toenail that his wife removed and there was pus behind it.  It worsened yesterday and they came to the ER.  No fever.  He is feeling well overall.  Also with diabetic ulcer on his R heel that was caused by an orthopedic boot.  He has been having routine debridement of that, improving, does not appear to be infected currently.  He wears an insulin pump, Dexcom.  Last A1c was 7.6.  Last ABI evaluation was maybe March and was abnormal, has neovasculature but unable to unblock.  Podiatry and wound care has been in [facility].  Patient was reportedly asked while at [facility] whether he would prefer to go to [facility] and declined, but he does not remember that conversation and now reports that he would prefer to receive care where all of his involved specialists (vascular, endocrinology, podiatry) are located.    ER Course:  [facility] transfer, per Dr Julian Reil:  Diabetic toe ulcer / infection / wet gangrene.  Admitting for ABx, presumably will need DPM or Dr. Lajoyce Corners involvement. [facility], saw them just 9 days ago but doesn't want to [facility] back to  [facility].      Review of Systems: As mentioned in the history of present illness. All other systems reviewed and are negative. Past Medical History:  Diagnosis Date   Aortic stenosis, moderate Sept/Oct /2013   2017-> s/p bioprosthetic AV [facility] Ease bovine pericardial valve model 3300 TFX, ser # [id number]).   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 Inferolateral perfusion defect + EKG abnormality during stress testing prompted Cath by [facility]: 3V CAD, EF normal.  Med mgmt recommended; b. 06/2015 Cath: 3VD and mod AS; c. 07/2015 CABG x 3 (LIMA->LAD, VG->OM2, VG->PDA) w/ AVR.   Carotid stenosis, right 09/2011   Dr. [name] did R carotid stenting 01/2014.  Carotid dopplers 06/2015 essentially normal.  No change 07/2016--repeat 1 yr.   Complication of anesthesia    difficulty with intubation,    Decreased pedal pulses    LE doppler 11/08/11- no evidence of arterial insufficiency   Diabetes mellitus Dx'd age 74   Novolog via insulin pump; sub-optimal control x years   Diabetic neuropathy (HCC)    fine touch and position sense affected   Diabetic retinopathy    Proliferative: Hx of retinal detachment on right-light perception only in right eye   Diastolic dysfunction 2007; 2016   TEE with diastolic dysfunction, EF 74%.   Difficult intubation    ~ 2002 difficult fiberoptic intubation with takeback bleeding s/p thyroidectomy;  for thyroidectomy was intubated DL X 1 with cricoid pressure but difficult mask (full beard)   Erectile dysfunction    [place]   GERD (gastroesophageal reflux disease)    Heart murmur    History of tobacco abuse    Quit about 1990 (has 35 pack-yr hx)   Hyperlipemia, mixed    a. intolerant of statins/zeta; per Dr. [name] 12/2016, pt started on PCSK9--incomplete responder?---01/2017.   Hypothyroidism, postsurgical    Thyroidectomy 2002; multinodular goiter.  Dr. Elvera Lennox managing this as of 10/2015.   Impingement syndrome of right shoulder    08/26/15 Pt got  subacromial steroid injection by orthopedist (Dr. Delfin Edis).  [facility] f/u 01/2016--MRI showed RC tear + AC joint arthritis, arthroscopic surgery done 03/2016 ([facility]).   OSA (obstructive sleep apnea)    06/2011 sleep study: moderate OSA, CPAP at 12 cm H2O.   Osteoarthritis    Bilat thumb carpometacarpal joints.  Ortho injected each thumb x 2 in 2017.  Also injected 03/14/17.   Recurrent pneumonia    TIA (transient ischemic attack) 05/2015   Left brain (right sided numbness + slurred speech)  Admitted for obs/workup 05/2015.   Venous reflux 10/14/2011   venous doppler-R GSV continuous reflux throughout; too small for VNUS closure   Past Surgical History:  Procedure Laterality Date   ABDOMINAL AORTOGRAM W/LOWER EXTREMITY N/A 10/27/2020   Procedure: ABDOMINAL AORTOGRAM W/LOWER EXTREMITY;  Surgeon: [name], MD;  Location: [facility] INVASIVE CV LAB;  Service: Vascular;  Laterality: N/A;   ABI  01/2016   Normal.  Dr. [name] to repeat in 1 yr.   AORTIC VALVE REPLACEMENT N/A 07/09/2015   Bovine pericardial valve.  Procedure: AORTIC VALVE REPLACEMENT (AVR);  Surgeon: Loreli Slot, MD;  Location: [facility] OR;  Service: Open Heart Surgery;  Laterality: N/A;   CARDIAC CATHETERIZATION  10/2011   EF normal.  Diffuse 3 vessel CAD, no stents placed.  Medical mgmt per [facility].   CARDIAC CATHETERIZATION N/A 06/22/2015   Procedure: Right/Left Heart Cath and Coronary Angiography;  Surgeon: Runell Gess, MD;  Location: [facility] INVASIVE CV LAB;  Service: Cardiovascular;  Laterality: N/A;   carotid dopplers  06/30/15   Normal: repeat when clinically indicated per Dr. [name]   CAROTID STENT INSERTION Right 01/16/2014   Procedure: CAROTID STENT INSERTION;  Surgeon: Runell Gess, MD;  Location: [facility] CATH LAB;  Service: Cardiovascular;  Laterality: Right;   CATARACT EXTRACTION     left   CORONARY ARTERY BYPASS GRAFT N/A 07/09/2015   Procedure: CORONARY ARTERY BYPASS GRAFTING (CABG)TIMES THREE USING LEFT  INTERNAL MAMMARY ARTERY AND LEFT SAPHENOUS LEG VEIN HARVESTED ENDOSCOPICALLY;  Surgeon: Loreli Slot, MD;  Location: [facility] OR;  Service: Open Heart Surgery;  Laterality: N/A;   EYE SURGERY     Multiple laser surgeries for diabetic retinopathy, also cataract surgery OU.   HARDWARE REMOVAL Right 05/28/2020   Procedure: HARDWARE REMOVAL AND EXCHANGE RIGHT ANKLE;  Surgeon: Terance Hart, MD;  Location: [facility] OR;  Service: Orthopedics;  Laterality: Right;   INTRAOPERATIVE TRANSESOPHAGEAL ECHOCARDIOGRAM N/A 07/09/2015   Procedure: INTRAOPERATIVE TRANSESOPHAGEAL ECHOCARDIOGRAM;  Surgeon: Loreli Slot, MD;  Location: [facility] OR;  Service: Open Heart Surgery;  Laterality: N/A;   lasix eye     LEFT AND RIGHT HEART CATHETERIZATION WITH CORONARY ANGIOGRAM N/A 10/28/2011   Procedure: LEFT AND R0IHT HEART CATHETERIZATION WITH CORONARY ANGIOGRAM;  Surgeon: Lennette Bihari, MD;  Location: [facility] CATH LAB;  Service: Cardiovascular;  Laterality: N/A;  ORIF ANKLE FRACTURE Right 07/23/2019   Procedure: OPEN REDUCTION RIGHT ANKLE DISLOCATION WITH REPAIR, OPEN TREATMENT OF TIBIAL PLAFOND FRACTURE WITH LATERAL MALLEOLUS, OPEN TREATMENT OF SYNDESMOSIS;  Surgeon: Terance Hart, MD;  Location: [facility] OR;  Service: Orthopedics;  Laterality: Right;  LENGTH OF SURGERY: 2.5 HOURS   RETINAL DETACHMENT SURGERY     Right eye   ROTATOR CUFF REPAIR W/ DISTAL CLAVICLE EXCISION Right 03/2016   Arthroscopic ([facility])   THYROID SURGERY  2002   TIBIA IM NAIL INSERTION Right 05/28/2020   Procedure: INTRAMEDULLARY NAIL RIGHT TIBIA;  Surgeon: Terance Hart, MD;  Location: [facility] OR;  Service: Orthopedics;  Laterality: Right;   TRANSTHORACIC ECHOCARDIOGRAM  10/2011;12/2012;01/2015; 08/2015; 07/28/16   Mod AS, mild LVH, EF 60-65%, no RWMA.  01/2015 EF 60-65%, grade I DD, progression of mod/sev AS.  08/2015 normal lv fxn with normal fxning bioprosthetic Ao valve.  07/2016--no change compared to 2017 echo.     Social History:   reports that he quit smoking about 41 years ago. His smoking use included cigarettes. He has never used smokeless tobacco. He reports that he does not currently use alcohol. He reports that he does not use drugs.  Allergies  Allergen Reactions   Rosuvastatin Calcium Other (See Comments)    Problem with higher dosages (Lipitor caused memory issues), also caused leg pains and lethargy   Statins Other (See Comments)    Problem with higher dosages (Lipitor caused memory issues), also caused leg pains and lethargy   Tape Rash and Other (See Comments)    Adhesive tape.  (Paper tape OK)    Family History  Problem Relation Age of Onset   Cancer Mother        lung cancer   Alcohol abuse Father    Cancer Father        laryngeal cancer   Cancer Brother        oldest brother had lung cancer and melanoma    Prior to Admission medications   Medication Sig Start Date End Date Taking? Authorizing Provider  aspirin EC 81 MG tablet Take 81 mg by mouth daily with lunch.    [provider]  clopidogrel (PLAVIX) 75 MG tablet TAKE 1 TABLET BY MOUTH EVERY DAY Patient taking differently: Take 75 mg by mouth daily at 12 noon. 07/04/16   Runell Gess, MD  clotrimazole-betamethasone (LOTRISONE) cream Apply 1 application  topically 2 (two) times daily as needed (itching/irritation). 06/15/20   [provider]  dimenhyDRINATE (DRAMAMINE) 50 MG tablet Take 50 mg by mouth daily with lunch.    [provider]  Evolocumab with Infusor (REPATHA PUSHTRONEX SYSTEM) 420 MG/3.5ML SOCT Inject 1 Dose into the skin every 30 (thirty) days. 04/04/22   Hilty, Lisette Abu, MD  Feverfew 380 MG CAPS Take 380 mg by mouth daily with lunch. To prevent migraines    [provider]  Insulin Human (INSULIN PUMP) SOLN Inject into the skin continuous. Basal rate .95/hr, current pump settings: 12am .7, 2am 1, 7am 1.1, 11am .9, 5pm .9.25, 11pm .85. Uses Humalog U-100 Insulin.    [provider]   insulin lispro (HUMALOG) 100 UNIT/ML injection Use 60 units via insulin pump. Patient taking differently: Use via insulin pump. 08/01/16   Carlus Pavlov, MD  levothyroxine (SYNTHROID) 137 MCG tablet Take 137 mcg by mouth every morning. 08/19/21   [provider]  loperamide (IMODIUM A-D) 2 MG tablet Take 2 mg by mouth every other day.    [provider]  losartan-hydrochlorothiazide (HYZAAR) 100-12.5 MG tablet Take 1 tablet by mouth daily at 12 noon. 06/26/19   [provider]  Multiple Vitamin (MULTIVITAMIN WITH MINERALS) TABS tablet Take 1 tablet by mouth daily with lunch. Multivitamin for Men 50+    [provider]  omeprazole (PRILOSEC OTC) 20 MG tablet Take 20 mg by mouth daily with lunch.    [provider]  rosuvastatin (CRESTOR) 5 MG tablet TAKE 1 TABLET (5 MG TOTAL) BY MOUTH DAILY. 03/02/22 02/25/23  Chrystie Nose, MD  tamsulosin (FLOMAX) 0.4 MG CAPS capsule Take 0.4 mg by mouth daily. 07/07/21   [provider]    Physical Exam: Vitals:   07/07/22 0400 07/07/22 0430 07/07/22 0601 07/07/22 0840  BP: (!) 153/78 135/71 (!) 143/71 120/61  Pulse: 90 89 93 98  Resp: 18 18 18 16   Temp: 98 F (36.7 C)  97.8 F (36.6 C) 98.2 F (36.8 C)  TempSrc: Oral  Oral Oral  SpO2: 98% 100% 99% 97%  Weight:      Height:       General:  Appears calm and comfortable and is in NAD Eyes:  EOMI, normal lids, iris ENT:  grossly normal hearing, lips & tongue, mmm Neck:  no LAD, masses or thyromegaly Cardiovascular:  RRR, no m/r/g.  Respiratory:   CTA bilaterally with no wheezes/rales/rhonchi.  Normal respiratory effort. Abdomen:  soft, NT, ND Skin:  R great toe with necrotic tip, diffuse R great toe erythema dorsally, streaking erythema + edema on R dorsal foot, R lower leg edema with stasis dermatitis; R heel ulcer appears deep but without frank infection        Musculoskeletal:  grossly normal tone BUE/BLE, good ROM, no bony  abnormality Psychiatric:  grossly normal mood and affect, speech fluent and appropriate, AOx3 Neurologic:  CN 2-12 grossly intact, moves all extremities in coordinated fashion   Radiological Exams on Admission: Independently reviewed - see discussion in A/P where applicable  DG Toe Great Right  Result Date: 07/07/2022 CLINICAL DATA:  First toe wound for 1 month, initial encounter EXAM: RIGHT GREAT TOE COMPARISON:  None Available. FINDINGS: Soft tissue thinning is noted over the distal aspect of the first toe. No discrete bony erosive changes are identified to suggest osteomyelitis. IMPRESSION: Soft tissue wound without erosive changes to suggest osteomyelitis. Electronically Signed   By: Alcide Clever M.D.   On: 07/07/2022 00:38    EKG: pending   Labs on Admission: I have personally reviewed the available labs and imaging studies at the time of the admission.  Pertinent labs:    Na++ 133 Glucose 270 BUN 26/Creatinine 1.51/GFR 48 Lipids on 1/26: 121/61/46/66 WBC 7.5 Hgb 10.1   Assessment and Plan: Principal Problem:   Diabetic infection of right foot (HCC) Active Problems:   Hyperlipidemia   PAD (peripheral artery disease) (HCC)   Poorly controlled type 1 diabetes mellitus with circulatory disorder (HCC)   Essential hypertension   S/P CABG x 3   Stage 3a chronic kidney disease (CKD) (HCC)   Postoperative hypothyroidism    R great toe infection with necrosis -Patient with known R heel ulcer, followed by podiatry in [place], last seen on 6/25 with debridement performed, ongoing wound care -Now with R great toe infection with necrosis and concern for surrounding cellulitis -Xray negative for osteo -No current concerns for sepsis -Will treat with IV antibiotics (Rocephin/Flagyl/Vanc as per the lower extremity wound algorithm) -Concern that the patient may be heading for amputation of  great toe/first ray but also possibly BKA if heel osteo is present -Last angiogram was in 02/2022 at  [facility], followed by Dr. Elonda Husky there -LE wound order set utilized including labs (CRP, ESR, A1c, prealbumin, HIV, and blood cultures)  -MRI ordered for further evaluation -After discussion with patient and given that his specialists are all at [facility], he requests transfer to [facility] -He has been accepted by Dr. Wynona Meals and will be transferred when a bed is available  T1DM -Will check A1c -Continue Dexcom/insulin pump -DM coordinator is consulting   CAD -s/p CABG -Also with known PVD s/p R carotid stent -Continue ASA and Plavix for now; he may need to stop Plavix pending possible amputation but will defer to primary team at [facility]  Stage 3a CKD -Appears to be stable at this time -Attempt to avoid nephrotoxic medications -Recheck BMP in AM   Chronic diastolic CHF -Appears compensated at this time  HTN -Hold Hyzaar for now  HLD -Continue rosuvastatin -On Repatha as outpatient  BPH -Continue tamsulosin  Hypothyroidism -Continue Synthroid at current dose for now      Advance Care Planning:   Code Status: Full Code - Code status was discussed with the patient and/or family at the time of admission.  The patient would want to receive full resuscitative measures at this time.   Consults: DM coordinator  DVT Prophylaxis: SQ heparin  Family Communication: None present; he declined to have me contact his wife at the time of admission  Severity of Illness: The appropriate patient status for this patient is OBSERVATION. Observation status is judged to be reasonable and necessary in order to provide the required intensity of service to ensure the patient's safety. The patient's presenting symptoms, physical exam findings, and initial radiographic and laboratory data in the context of their medical condition is felt to place them at decreased risk for further clinical deterioration. Furthermore, it is anticipated that the patient will be medically stable for discharge from the  hospital within 2 midnights of admission.   Author: Jonah Blue, MD 07/07/2022 11:24 AM  For on call review [email or web address].

## 2022-07-07 NOTE — Plan of Care (Signed)

## 2022-07-07 NOTE — ED Notes (Signed)
Report to [facility] 

## 2022-07-07 NOTE — Progress Notes (Signed)
Contacted [facility]. Transport will arrive in 15 mins

## 2022-07-07 NOTE — Inpatient Diabetes Management (Signed)
Inpatient Diabetes Program Recommendations  AACE/ADA: New Consensus Statement on Inpatient Glycemic Control (2015)  Target Ranges:  Prepandial:   less than 140 mg/dL      Peak postprandial:   less than 180 mg/dL (1-2 hours)      Critically ill patients:  140 - 180 mg/dL   Lab Results  Component Value Date   GLUCAP 217 (H) 05/28/2020   HGBA1C 7.6 (H) 05/27/2020    Review of Glycemic Control  Diabetes history: type 1 Outpatient Diabetes medications: insulin pump,Dexcom CGM Current orders for Inpatient glycemic control: insulin pump order set  Inpatient Diabetes Program Recommendations:   Spoke with patient about his insulin pump. States that he is wearing the pump now. His last blood sugar per Dexcom was 148 mg/dl. He has not had any food to eat.  He will need to be changing his site today. Wife is bringing supplies for pump and his insulin that he uses in the pump. Patient states that he is probably going to be transferred to [facility] sometime today, since his records are there.   Insulin pump settings: (per med rec) 12 am 0.7 units 2 am  1.0 units 7 am 1.1 units 11 am 0.9 units 5 pm  0.925 units 11 pm 0.85 units   CHO ratio: 1 units/6 grams Target range: 110 mg/dl  Patient not sure of insulin sensitivity.  Smith Mince RN BSN CDE Diabetes Coordinator Pager: [phone]  8am-5pm

## 2022-07-07 NOTE — ED Provider Notes (Signed)
[name] EMERGENCY DEPARTMENT AT [facility] Provider Note   CSN: [id number] Arrival date & time: 07/06/22  2336     History  Chief Complaint  Patient presents with   Wound Check    [name] is a 72 y.o. male.  Patient is a 72 year old male with history of type 1 diabetes with peripheral neuropathy, obstructive sleep apnea, GERD, venous insufficiency.  Patient presenting today with complaints of a swollen, discolored right great toe.  This has been worsening over the past month, but much worse over the past 2 days.  He denies any fevers or chills.  The wife has been tending to the wound at home and reports purulent drainage.  He has been taking Cipro for the past week, but wound is not improving.  The history is provided by the patient.       Home Medications Prior to Admission medications   Medication Sig Start Date End Date Taking? Authorizing Provider  aspirin EC 81 MG tablet Take 81 mg by mouth daily with lunch.    [provider]  clopidogrel (PLAVIX) 75 MG tablet TAKE 1 TABLET BY MOUTH EVERY DAY Patient taking differently: Take 75 mg by mouth daily at 12 noon. 07/04/16   Runell Gess, MD  clotrimazole-betamethasone (LOTRISONE) cream Apply 1 application  topically 2 (two) times daily as needed (itching/irritation). 06/15/20   [provider]  dimenhyDRINATE (DRAMAMINE) 50 MG tablet Take 50 mg by mouth daily with lunch.    [provider]  Evolocumab with Infusor (REPATHA PUSHTRONEX SYSTEM) 420 MG/3.5ML SOCT Inject 1 Dose into the skin every 30 (thirty) days. 04/04/22   Hilty, Lisette Abu, MD  Feverfew 380 MG CAPS Take 380 mg by mouth daily with lunch. To prevent migraines    [provider]  Insulin Human (INSULIN PUMP) SOLN Inject into the skin continuous. Basal rate .95/hr, current pump settings: 12am .7, 2am 1, 7am 1.1, 11am .9, 5pm .9.25, 11pm .85. Uses Humalog U-100 Insulin.    [provider]  insulin lispro  (HUMALOG) 100 UNIT/ML injection Use 60 units via insulin pump. Patient taking differently: Use via insulin pump. 08/01/16   Carlus Pavlov, MD  levothyroxine (SYNTHROID) 137 MCG tablet Take 137 mcg by mouth every morning. 08/19/21   [provider]  loperamide (IMODIUM A-D) 2 MG tablet Take 2 mg by mouth every other day.    [provider]  losartan-hydrochlorothiazide (HYZAAR) 100-12.5 MG tablet Take 1 tablet by mouth daily at 12 noon. 06/26/19   [provider]  Multiple Vitamin (MULTIVITAMIN WITH MINERALS) TABS tablet Take 1 tablet by mouth daily with lunch. Multivitamin for Men 50+    [provider]  omeprazole (PRILOSEC OTC) 20 MG tablet Take 20 mg by mouth daily with lunch.    [provider]  rosuvastatin (CRESTOR) 5 MG tablet TAKE 1 TABLET (5 MG TOTAL) BY MOUTH DAILY. 03/02/22 02/25/23  Chrystie Nose, MD  tamsulosin (FLOMAX) 0.4 MG CAPS capsule Take 0.4 mg by mouth daily. 07/07/21   [provider]      Allergies    Rosuvastatin calcium, Statins, and Tape    Review of Systems   Review of Systems  All other systems reviewed and are negative.   Physical Exam Updated Vital Signs BP (!) 126/59   Pulse 92   Temp 98 F (36.7 C) (Oral)   Resp 18   Ht 6' (1.829 m)   Wt 106.6 kg   SpO2 96%  BMI 31.87 kg/m  Physical Exam Vitals and nursing note reviewed.  Constitutional:      Appearance: Normal appearance.  HENT:     Head: Normocephalic.  Pulmonary:     Effort: Pulmonary effort is normal.  Skin:    General: Skin is warm and dry.     Comments: The right great toe appears necrotic at the tip with erythema extending up into the foot.  Neurological:     Mental Status: He is alert and oriented to person, place, and time.         ED Results / Procedures / Treatments   Labs (all labs ordered are listed, but only abnormal results are displayed) Labs Reviewed  CBC WITH DIFFERENTIAL/PLATELET - Abnormal; Notable for the  following components:      Result Value   RBC 3.57 (*)    Hemoglobin 10.1 (*)    HCT 31.3 (*)    All other components within normal limits  BASIC METABOLIC PANEL - Abnormal; Notable for the following components:   Sodium 133 (*)    Glucose, Bld 270 (*)    BUN 26 (*)    Creatinine, Ser 1.51 (*)    Calcium 8.3 (*)    GFR, Estimated 48 (*)    All other components within normal limits    EKG None  Radiology DG Toe Great Right  Result Date: 07/07/2022 CLINICAL DATA:  First toe wound for 1 month, initial encounter EXAM: RIGHT GREAT TOE COMPARISON:  None Available. FINDINGS: Soft tissue thinning is noted over the distal aspect of the first toe. No discrete bony erosive changes are identified to suggest osteomyelitis. IMPRESSION: Soft tissue wound without erosive changes to suggest osteomyelitis. Electronically Signed   By: Alcide Clever M.D.   On: 07/07/2022 00:38    Procedures Procedures    Medications Ordered in ED Medications - No data to display  ED Course/ Medical Decision Making/ A&P  Patient is a 72 year old male with history of type 1 diabetes presenting with complaints of right great toe discoloration, swelling worsening over the past month.  Patient arrives here with stable vital signs and is afebrile.  He has a necrotic appearing right great toe with purulent drainage and erythema extending up the foot.  Workup initiated including CBC and basic metabolic panel.  His blood sugar is 270, but laboratory studies otherwise unremarkable.  X-rays of the foot show no bony erosion that would be consistent with osteomyelitis.  As patient has a necrotic toe that is clearly infected and has failed outpatient antibiotics, I feel as though admission is indicated.  I spoken with the hospitalist who agrees to admit.  Antibiotics given including Rocephin, vancomycin, and Flagyl.  Final Clinical Impression(s) / ED Diagnoses Final diagnoses:  None    Rx / DC Orders ED Discharge Orders      None         Geoffery Lyons, MD 07/07/22 [phone]

## 2022-07-07 NOTE — Progress Notes (Signed)
Pharmacy Antibiotic Note  [name] is a 72 y.o. male admitted on 07/07/2022 with  lower extremity wound infection .  Pharmacy has been consulted for Vancomycin dosing. He has a history significant for type 1 diabetes.   His right great toe appears necrotic and red, per MD assessment. Patient's white blood cell count is normal at 7.5k and he remains afebrile with a stable blood pressure at this time. His scr 1.51 today with no other available values in the last year. His BMI is 31.87 kg/m^2. MRSA coverage indicated at this time per MD and as diabetic foot infections are usually polymicrobial.   Plan: Give Vancomycin 1500 mg IV x 1 then start maintenance vancomycin at 1250mg  IV q24 hours (eAUC 531, Scr 1.51, Vd 0.5, IBW)  Patient already received Vancomycin 1g this AM at 0600 - so will order 500mg  IV x 1 now to complete loading dose  Follow up cultures, potential operative plans, clinical progress, signs and symptoms of infection, and ability to narrow.     Height: 6' (182.9 cm) Weight: 106.6 kg (235 lb) IBW/kg (Calculated) : 77.6  Temp (24hrs), Avg:98 F (36.7 C), Min:97.8 F (36.6 C), Max:98.2 F (36.8 C)  Recent Labs  Lab 07/07/22 0015  WBC 7.5  CREATININE 1.51*    Estimated Creatinine Clearance: 55 mL/min (A) (by C-G formula based on SCr of 1.51 mg/dL (H)).    Allergies  Allergen Reactions   Rosuvastatin Calcium Other (See Comments)    Problem with higher dosages (Lipitor caused memory issues), also caused leg pains and lethargy   Statins Other (See Comments)    Problem with higher dosages (Lipitor caused memory issues), also caused leg pains and lethargy   Tape Rash and Other (See Comments)    Adhesive tape.  (Paper tape OK)    Antimicrobials this admission: Ceftriaxone 7/4 >>  Flaygy 7/4 >> (7/20)   Dose adjustments this admission: none  Microbiology results: None yet   BCx:   UCx:    Sputum:    MRSA PCR:   Thank you for allowing pharmacy to be a part of  this patient's care.  Blane Ohara, PharmD  PGY2 Pharmacy Resident

## 2022-07-07 NOTE — Progress Notes (Signed)
Pt being transferred to [facility] for continuous care - report called to accepting nurse on 7 [address] rm 751. All belongings will be transported with patient.

## 2022-07-07 NOTE — Discharge Summary (Signed)
Physician Discharge Summary   Patient: [name] MRN: [id number] DOB: 1950/11/17  Admit date:     07/07/2022  Discharge date: 07/07/22  Discharge Physician: Jonah Blue   PCP: Bailey Mech, PA-C   Recommendations at discharge:   You are being transferred to [facility] for ongoing treatment of your great toe infection Hindfoot/ankle MRI will need to be performed separately, as the MRI performed at [facility] included only forefoot images  Discharge Diagnoses: Principal Problem:   Diabetic infection of right foot (HCC) Active Problems:   Hyperlipidemia   PAD (peripheral artery disease) (HCC)   Poorly controlled type 1 diabetes mellitus with circulatory disorder (HCC)   Essential hypertension   S/P CABG x 3   Stage 3a chronic kidney disease (CKD) (HCC)   Postoperative hypothyroidism  Chief Complaint: Toe infection   HPI: [name] is a 72 y.o. male with medical history significant of CAD s/p CABG, DM, PAD (R carotid stent), chronic diastolic CHF, HTN, HLD, hypothyroidism, and TIA presenting with toe necrosis.  He reports that he is T1DM and has had CABG and AVR.  About a month ago, he had a large blood blister on the end of his R great toe.  He has neuropathy and can't really feel feet.  His wife has been taking care of it.  He saw his podiatrist last week and it looked crusty on top and was told to keep it dry.  This week, it started looking bad, ingrown toenail that his wife removed and there was pus behind it.  It worsened yesterday and they came to the ER.  No fever.  He is feeling well overall.  Also with diabetic ulcer on his R heel that was caused by an orthopedic boot.  He has been having routine debridement of that, improving, does not appear to be infected currently.  He wears an insulin pump, Dexcom.  Last A1c was 7.6.  Last ABI evaluation was maybe March and was abnormal, has neovasculature but unable to unblock.  Podiatry and wound care has been in  [facility].  Patient was reportedly asked while at [facility] whether he would prefer to go to [facility] and declined, but he does not remember that conversation and now reports that he would prefer to receive care where all of his involved specialists (vascular, endocrinology, podiatry) are located.   Hospital Course: Patient was transferred from [facility] to [facility] overnight for evaluation/treatment of R great toe osteomyelitis (confirmed with MRI).  Antibiotics were started but since his doctors (PCP, endocrinology, podiatry, and vascular surgery) are all at [facility] in [facility], he requested transfer there for definitive care.  Assessment and Plan:   R great toe infection with necrosis -Patient with known R heel ulcer, followed by podiatry in [place], last seen on 6/25 with debridement performed, ongoing wound care -Now with R great toe infection with necrosis and concern for surrounding cellulitis -Xray negative for osteo -No current concerns for sepsis -Will treat with IV antibiotics (Rocephin/Flagyl/Vanc as per the lower extremity wound algorithm) -Concern that the patient may be heading for amputation of great toe/first ray but also possibly BKA if heel osteo is present -Last angiogram was in 02/2022 at [facility], followed by Dr. Elonda Husky there -LE wound order set utilized including labs (CRP, ESR, A1c, prealbumin, HIV, and blood cultures)  -MRI ordered for further evaluation: Soft tissue wound of the great toe. Bone marrow edema in the first distal phalanx consistent with osteomyelitis. Hematoma along the  dorsal medial aspect of the midfoot and forefoot overlying the first metatarsal. -Unfortunately, hindfoot was not effectively imaged and likely needs a separate MRI performed upon arrival at [facility] -After discussion with patient and given that his specialists are all at [facility], he requests transfer to [facility] -He has been accepted by Dr. Wynona Meals and will be transferred when a bed is available    T1DM -Will check A1c -Continue Dexcom/insulin pump -DM coordinator is consulting    CAD -s/p CABG -Also with known PVD s/p R carotid stent -Continue ASA and Plavix for now; he may need to stop Plavix pending possible amputation but will defer to primary team at [facility]   Stage 3a CKD -Appears to be stable at this time -Attempt to avoid nephrotoxic medications   Chronic diastolic CHF -Appears compensated at this time   HTN -Hold Hyzaar for now   HLD -Continue rosuvastatin -On Repatha as outpatient   BPH -Continue tamsulosin   Hypothyroidism -Continue Synthroid at current dose for now   Obesity -Body mass index is 31.87 kg/m..  -Weight loss should be encouraged -Outpatient PCP/bariatric medicine f/u encouraged     Pain control - [facility] Controlled Substance Reporting System database was reviewed. and patient was instructed, not to drive, operate heavy machinery, perform activities at heights, swimming or participation in water activities or provide baby-sitting services while on Pain, Sleep and Anxiety Medications; until their outpatient Physician has advised to do so again. Also recommended to not to take more than prescribed Pain, Sleep and Anxiety Medications.   Consultants: DM Coordinator Procedures performed: MRI  Disposition:  [facility] to [facility] Diet recommendation:  Cardiac and Carb modified diet DISCHARGE MEDICATION: Allergies as of 07/07/2022       Reactions   Crestor [rosuvastatin Calcium] Other (See Comments)   Problem with higher dosages (Lipitor caused memory issues) Myalgias Lethargy   Statins Other (See Comments)   Problem with higher dosages (Lipitor caused memory issues) Myalgias Lethargy   Tape Rash, Other (See Comments)   Adhesive tape.  (Paper tape OK)        Medication List     STOP taking these medications    ciprofloxacin 500 MG tablet Commonly known as: CIPRO   FEVERFEW PO       TAKE these medications     acetaminophen 500 MG tablet Commonly known as: TYLENOL Take 1,000 mg by mouth daily as needed for headache, fever or moderate pain.   aspirin EC 81 MG tablet Take 81 mg by mouth every evening.   bisacodyl 5 MG EC tablet Commonly known as: DULCOLAX Take 1 tablet (5 mg total) by mouth daily as needed for moderate constipation.   cefTRIAXone 2 g in sodium chloride 0.9 % 100 mL Inject 2 g into the vein daily. Start taking on: July 08, 2022   cetirizine 10 MG tablet Commonly known as: ZYRTEC Take 10 mg by mouth every evening.   clopidogrel 75 MG tablet Commonly known as: PLAVIX Take 1 tablet (75 mg total) by mouth daily at 12 noon. What changed:  when to take this Another medication with the same name was removed. Continue taking this medication, and follow the directions you see here.   docusate sodium 100 MG capsule Commonly known as: COLACE Take 1 capsule (100 mg total) by mouth 2 (two) times daily.   DRAMAMINE PO Take 1 tablet by mouth every evening.   heparin 5000 UNIT/ML injection Inject 1 mL (5,000 Units total) into the skin every 8 (  eight) hours.   hydrALAZINE 20 MG/ML injection Commonly known as: APRESOLINE Inject 0.25 mLs (5 mg total) into the vein every 4 (four) hours as needed.   insulin lispro 100 UNIT/ML injection Commonly known as: HumaLOG Use 60 units via insulin pump. What changed:  how much to take how to take this when to take this additional instructions   levothyroxine 137 MCG tablet Commonly known as: SYNTHROID Take 137 mcg by mouth every morning.   loperamide 2 MG tablet Commonly known as: IMODIUM A-D Take 2 mg by mouth every evening.   losartan-hydrochlorothiazide 50-12.5 MG tablet Commonly known as: HYZAAR Take 1 tablet by mouth every evening.   metroNIDAZOLE 500 MG/100ML Commonly known as: FLAGYL Inject 100 mLs (500 mg total) into the vein every 8 (eight) hours.   omeprazole 20 MG tablet Commonly known as: PRILOSEC OTC Take 20  mg by mouth every evening.   ondansetron 4 MG/2ML Soln injection Commonly known as: ZOFRAN Inject 2 mLs (4 mg total) into the vein every 6 (six) hours as needed for nausea.   One-A-Day Mens 50+ Tabs Take 1 tablet by mouth every evening.   pantoprazole 40 MG tablet Commonly known as: PROTONIX Take 1 tablet (40 mg total) by mouth daily. Start taking on: July 08, 2022   polyethylene glycol 17 g packet Commonly known as: MIRALAX / GLYCOLAX Take 17 g by mouth daily as needed for mild constipation.   Repatha Pushtronex System 420 MG/3.5ML Soct Generic drug: Evolocumab with Infusor Inject 420 mg into the skin every 30 (thirty) days. 5th of each month.   rosuvastatin 5 MG tablet Commonly known as: CRESTOR Take 1 tablet (5 mg total) by mouth daily.   tamsulosin 0.4 MG Caps capsule Commonly known as: FLOMAX Take 0.4 mg by mouth every evening.   vancomycin 1250 MG/250ML Soln Commonly known as: VANCOREADY Inject 250 mLs (1,250 mg total) into the vein daily. Start taking on: July 08, 2022        Discharge Exam: Ceasar Mons Weights   07/07/22 0004  Weight: 106.6 kg     Condition at discharge: fair  The results of significant diagnostics from this hospitalization (including imaging, microbiology, ancillary and laboratory) are listed below for reference.   Imaging Studies: MRI Right foot with and without contrast  Result Date: 07/07/2022 CLINICAL DATA:  Soft tissue infection of the great toe. Evaluate for osteomyelitis. EXAM: MRI OF THE RIGHT FOREFOOT WITHOUT AND WITH CONTRAST TECHNIQUE: Multiplanar, multisequence MR imaging of the right forefoot was performed before and after the administration of intravenous contrast. CONTRAST:  10mL GADAVIST GADOBUTROL 1 MMOL/ML IV SOLN COMPARISON:  None Available. FINDINGS: Bones/Joint/Cartilage Soft tissue wound of the great toe. Bone marrow edema in the first distal phalanx consistent with osteomyelitis. No acute fracture or dislocation. Normal  alignment. No joint effusion. No marrow signal abnormality. 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 Mild osteoarthritis of the first MTP joint. Ligaments Collateral ligaments are intact.  Lisfranc ligament is intact. Muscles and Tendons Flexor, peroneal and extensor compartment tendons are intact. Mild generalized muscle atrophy. T2 hyperintense signal throughout the plantar musculature likely neurogenic. Soft tissue No fluid collection or hematoma. No soft tissue mass. Soft tissue edema along the dorsal aspect of the foot. Complex fluid collection along the dorsal medial aspect of the midfoot and forefoot overlying the first metatarsal with T1 hyperintensity concerning for a hematoma measuring approximately 4.8 x 0.9 x 6.4 cm. IMPRESSION: 1. Soft tissue wound of the great toe. Bone marrow edema in  the first distal phalanx consistent with osteomyelitis. 2. Hematoma along the dorsal medial aspect of the midfoot and forefoot overlying the first metatarsal. Electronically Signed   By: Elige Ko M.D.   On: 07/07/2022 12:13   DG Toe Great Right  Result Date: 07/07/2022 CLINICAL DATA:  First toe wound for 1 month, initial encounter EXAM: RIGHT GREAT TOE COMPARISON:  None Available. FINDINGS: Soft tissue thinning is noted over the distal aspect of the first toe. No discrete bony erosive changes are identified to suggest osteomyelitis. IMPRESSION: Soft tissue wound without erosive changes to suggest osteomyelitis. Electronically Signed   By: Alcide Clever M.D.   On: 07/07/2022 00:38    Microbiology: Results for orders placed or performed during the hospital encounter of 05/27/20  Surgical pcr screen     Status: None   Collection Time: 05/27/20 11:22 AM   Specimen: Nasal Mucosa; Nasal Swab  Result Value Ref Range Status   MRSA, PCR NEGATIVE NEGATIVE Final   Staphylococcus aureus NEGATIVE NEGATIVE Final    Comment: (NOTE) The Xpert SA Assay (FDA approved for NASAL specimens in patients 22 years of  age and older), is one component of a comprehensive surveillance program. It is not intended to diagnose infection nor to guide or monitor treatment. Performed at [facility], [address] [place], [place] [address]   SARS CORONAVIRUS 2 (TAT 6-24 HRS) Nasopharyngeal Nasopharyngeal Swab     Status: None   Collection Time: 05/27/20 11:39 AM   Specimen: Nasopharyngeal Swab  Result Value Ref Range Status   SARS Coronavirus 2 NEGATIVE NEGATIVE Final    Comment: (NOTE) SARS-CoV-2 target nucleic acids are NOT DETECTED.  The SARS-CoV-2 RNA is generally detectable in upper and lower respiratory specimens during the acute phase of infection. Negative results do not preclude SARS-CoV-2 infection, do not rule out co-infections with other pathogens, and should not be used as the sole basis for treatment or other patient management decisions. Negative results must be combined with clinical observations, patient history, and epidemiological information. The expected result is Negative.  Fact Sheet for Patients: HairSlick.no  Fact Sheet for Healthcare Providers: quierodirigir.com  This test is not yet approved or cleared by the [place] FDA and  has been authorized for detection and/or diagnosis of SARS-CoV-2 by FDA under an Emergency Use Authorization (EUA). This EUA will remain  in effect (meaning this test can be used) for the duration of the COVID-19 declaration under Se ction 564(b)(1) of the Act, 21 U.S.C. section 360bbb-3(b)(1), unless the authorization is terminated or revoked sooner.  Performed at [facility], [address] [place], [place] [address]       Discharge time spent: greater than 30 minutes.  Signed: Jonah Blue, MD [facility] 07/07/2022

## 2022-10-12 ENCOUNTER — Ambulatory Visit (HOSPITAL_COMMUNITY): Payer: PPO | Attending: Cardiovascular Disease

## 2022-10-12 DIAGNOSIS — E782 Mixed hyperlipidemia: Secondary | ICD-10-CM | POA: Insufficient documentation

## 2022-10-12 DIAGNOSIS — I6521 Occlusion and stenosis of right carotid artery: Secondary | ICD-10-CM | POA: Insufficient documentation

## 2022-10-12 DIAGNOSIS — Z951 Presence of aortocoronary bypass graft: Secondary | ICD-10-CM | POA: Diagnosis present

## 2022-10-12 DIAGNOSIS — I350 Nonrheumatic aortic (valve) stenosis: Secondary | ICD-10-CM | POA: Insufficient documentation

## 2022-10-12 DIAGNOSIS — I10 Essential (primary) hypertension: Secondary | ICD-10-CM | POA: Insufficient documentation

## 2022-10-12 DIAGNOSIS — I739 Peripheral vascular disease, unspecified: Secondary | ICD-10-CM | POA: Diagnosis present

## 2022-10-12 LAB — ECHOCARDIOGRAM COMPLETE
AR max vel: 1.43 cm2
AV Area VTI: 1.91 cm2
AV Area mean vel: 1.34 cm2
AV Mean grad: 15 mm[Hg]
AV Peak grad: 25.8 mm[Hg]
Ao pk vel: 2.54 m/s
Area-P 1/2: 4.29 cm2
S' Lateral: 3.5 cm

## 2022-10-12 MED ORDER — PERFLUTREN LIPID MICROSPHERE
1.0000 mL | INTRAVENOUS | Status: AC | PRN
Start: 2022-10-12 — End: 2022-10-12
  Administered 2022-10-12: 2 mL via INTRAVENOUS

## 2022-10-13 ENCOUNTER — Other Ambulatory Visit (HOSPITAL_COMMUNITY): Payer: Self-pay

## 2022-10-13 DIAGNOSIS — I2510 Atherosclerotic heart disease of native coronary artery without angina pectoris: Secondary | ICD-10-CM

## 2022-10-13 DIAGNOSIS — Z952 Presence of prosthetic heart valve: Secondary | ICD-10-CM

## 2022-10-13 DIAGNOSIS — I10 Essential (primary) hypertension: Secondary | ICD-10-CM

## 2022-10-13 DIAGNOSIS — Z951 Presence of aortocoronary bypass graft: Secondary | ICD-10-CM

## 2022-10-13 DIAGNOSIS — E782 Mixed hyperlipidemia: Secondary | ICD-10-CM

## 2022-12-07 ENCOUNTER — Telehealth: Payer: Self-pay | Admitting: Internal Medicine

## 2022-12-07 NOTE — Telephone Encounter (Signed)
Patient identification verified by 2 forms. Marilynn Rail, RN    Called and spoke to patient  Patient states pharmacy informed him manufacture is out of this medication, does not have at this time  Advised patient to contact pharmacy regarding when it will be in stock or if there is alternate location  Patient verbalized understanding, no questions at this time

## 2022-12-07 NOTE — Telephone Encounter (Signed)
Pt went to pick up Repatha from [facility] and they do not have it. Wants to know if another Pharmacy has it and if so can we send it to them. Pt would like a call back

## 2023-01-05 ENCOUNTER — Telehealth: Payer: Self-pay | Admitting: Internal Medicine

## 2023-01-05 MED ORDER — REPATHA SURECLICK 140 MG/ML ~~LOC~~ SOAJ: 140.0000 mg | SUBCUTANEOUS | 1 refills | Status: DC

## 2023-01-05 NOTE — Telephone Encounter (Signed)
 Pt called in for a refill, however he said that the pharmacy does not carry pushtronex repatha anymore. They only have Repatha sureclick. Please advise if the rx can be changed and sent to [facility].

## 2023-01-05 NOTE — Telephone Encounter (Signed)
 Refill for repatha sureclick sent to [facility]

## 2023-02-15 ENCOUNTER — Other Ambulatory Visit: Payer: Self-pay | Admitting: *Deleted

## 2023-02-15 DIAGNOSIS — E785 Hyperlipidemia, unspecified: Secondary | ICD-10-CM

## 2023-03-20 ENCOUNTER — Ambulatory Visit: Payer: PPO | Attending: Cardiovascular Disease | Admitting: Cardiovascular Disease

## 2023-03-20 ENCOUNTER — Encounter: Payer: Self-pay | Admitting: Cardiovascular Disease

## 2023-03-20 VITALS — BP 136/56 | HR 91 | Ht 74.0 in | Wt 257.0 lb

## 2023-03-20 DIAGNOSIS — Z952 Presence of prosthetic heart valve: Secondary | ICD-10-CM

## 2023-03-20 DIAGNOSIS — I350 Nonrheumatic aortic (valve) stenosis: Secondary | ICD-10-CM

## 2023-03-20 DIAGNOSIS — E1059 Type 1 diabetes mellitus with other circulatory complications: Secondary | ICD-10-CM | POA: Diagnosis not present

## 2023-03-20 DIAGNOSIS — I6521 Occlusion and stenosis of right carotid artery: Secondary | ICD-10-CM

## 2023-03-20 DIAGNOSIS — E1065 Type 1 diabetes mellitus with hyperglycemia: Secondary | ICD-10-CM

## 2023-03-20 DIAGNOSIS — I2510 Atherosclerotic heart disease of native coronary artery without angina pectoris: Secondary | ICD-10-CM

## 2023-03-20 DIAGNOSIS — E785 Hyperlipidemia, unspecified: Secondary | ICD-10-CM

## 2023-03-20 DIAGNOSIS — I2583 Coronary atherosclerosis due to lipid rich plaque: Secondary | ICD-10-CM

## 2023-03-20 DIAGNOSIS — Z951 Presence of aortocoronary bypass graft: Secondary | ICD-10-CM

## 2023-03-20 DIAGNOSIS — I739 Peripheral vascular disease, unspecified: Secondary | ICD-10-CM

## 2023-03-20 DIAGNOSIS — I10 Essential (primary) hypertension: Secondary | ICD-10-CM

## 2023-03-20 DIAGNOSIS — E782 Mixed hyperlipidemia: Secondary | ICD-10-CM

## 2023-03-20 NOTE — Assessment & Plan Note (Signed)
 History of hyperlipidemia on Repatha with lipid profile performed 1/26 at 24 revealing total cholesterol 121, LDL 46 and HDL 61.

## 2023-03-20 NOTE — Progress Notes (Signed)
 03/20/2023 [name]   1951-01-02  [id number]  Primary Physician Podraza, Rudy Jew, PA-C Primary Cardiologist: Runell Gess MD FACP, [place], [place], [place]  HPI:  [name] is a 73 y.o.  mildly overweight married Caucasian male, father of 3 and grandfather to 3 grandchildren, I last saw him in the office 10/12/2021.  He is accompanied by his wife [name] today.  He has a history of insulin-dependent diabetes on insulin pump, mild sleep apnea, and hyperlipidemia as well as mild aortic stenosis. He had a Myoview stress test that was abnormal which led to a heart catheterization performed by Dr. Tresa Endo in my absence revealing 70% disease in all 3 vessels with preserved LV function and mild AS. He also had moderate right ICA stenosis. He was neurologically asymptomatic. He was placed on beta-blocker and Ranexa Carotid Dopplers showed stable moderate right ICA stenosis. He was admitted to [facility] with 2 sequential TIAs. Carotid Dopplers did show moderate right ICA stenosis. The patient was seen by Dr. Pearlean Brownie, stroke neurologist, asked Dr. Imogene Burn, vascular surgeon for consultation. Dr. Imogene Burn ordered a CTA which showed high-grade ostial right internal carotid artery stenosis. I am asked to see the patient for consideration of carotid artery stenting. The patient was offered carotid endarterectomy. [name] apparently had a bad experience remotely being intubated at the time of the parathyroid operation and does not want to be reintubated again if at all possible making carotid stenting the best option for revascularization. I perform this on 01/16/14 successfully without complication. His follow-up Doppler 2 weeks later showed his carotid artery widely patent. Since I saw him 6 months ago he's been admitted with TIA type symptoms with negative workup. He also complains of chronic dizziness and increasing dyspnea on exertion. This recent 2-D echo showed increase in hisaortic stenosis  now to moderately severewith a peak gradient that has increased from 35 up to 52 mmHg. [name] underwent right and left heart cath by myself 06/22/15 revealing moderate aortic stenosis with three-vessel coronary artery disease. Essentially underwent elective aortic valve replacement with a Medtronic magna ease 23 mm pericardial tissue valve and bypass grafting X 3 on 07/09/15 with a LIMA to his LAD, vein to obtuse marginal branch #2 and to the PDA. He did well. Postoperatively. Follow-up 2-D echocardiogram performed 08/19/15 revealed normal LV function with a well functioning aortic bioprosthesis. He feels clinically improved. He has been completely asymptomatic since his CABG/AVR. He did have right rotator cuff surgery in March 2018.   He did have a wound on his right foot/critical ischemia and underwent angiography with attempt at endovascular therapy for limb salvage by Dr. Bernarda Caffey unsuccessfully.  He has one-vessel runoff via peroneal.  No other tibial vessels 1 to the foot.  The wound is slowly healed with local wound Carter.    Since I saw him in the office a year and a half ago he continues to do well.  He has put on about 15 pounds.  He complains of some mild dyspnea on exertion.  His last 2D echo performed 10/12/2022 revealed normal LV systolic function with a well-functioning aortic bioprosthesis.  His mean gradient was 15 mmHg.  Carotid Dopplers performed/17/24 revealed a patent right carotid stent with moderate left ICA stenosis.  Current Meds  Medication Sig   acetaminophen (TYLENOL) 500 MG tablet Take 1,000 mg by mouth daily as needed for headache, fever or moderate pain.   aspirin EC 81 MG tablet Take 81  mg by mouth every evening.   cetirizine (ZYRTEC) 10 MG tablet Take 10 mg by mouth every evening.   clopidogrel (PLAVIX) 75 MG tablet Take 1 tablet (75 mg total) by mouth daily at 12 noon.   clotrimazole-betamethasone (LOTRISONE) cream Apply 1 Application topically as needed.   Evolocumab  (REPATHA SURECLICK) 140 MG/ML SOAJ Inject 140 mg into the skin every 14 (fourteen) days.   hydrochlorothiazide (HYDRODIURIL) 12.5 MG tablet Take 12.5 mg by mouth daily.   insulin lispro (HUMALOG) 100 UNIT/ML injection Use 60 units via insulin pump. (Patient taking differently: Inject 100 Units into the skin See admin instructions. Max daily dose 100 units via insulin pump. Continuous.)   levothyroxine (SYNTHROID) 150 MCG tablet Take 137 mcg by mouth every morning.   loperamide (IMODIUM A-D) 2 MG tablet Take 2 mg by mouth every evening.   losartan (COZAAR) 25 MG tablet Take 25 mg by mouth daily.   losartan-hydrochlorothiazide (HYZAAR) 50-12.5 MG tablet Take 1 tablet by mouth every evening.   Multiple Vitamin (MULTI-VITAMIN) tablet Take 1 tablet by mouth daily.   Multiple Vitamins-Minerals (ONE-A-DAY MENS 50+) TABS Take 1 tablet by mouth every evening.   omeprazole (PRILOSEC OTC) 20 MG tablet Take 20 mg by mouth every evening.   silver sulfADIAZINE (SILVADENE) 1 % cream Apply 1 Application topically as needed.   tamsulosin (FLOMAX) 0.4 MG CAPS capsule Take 0.4 mg by mouth every evening.     Allergies  Allergen Reactions   Crestor [Rosuvastatin Calcium] Other (See Comments)    Problem with higher dosages (Lipitor caused memory issues) Myalgias Lethargy   Statins Other (See Comments)    Problem with higher dosages (Lipitor caused memory issues) Myalgias Lethargy   Tape Rash and Other (See Comments)    Adhesive tape.  (Paper tape OK)    Social History   Socioeconomic History   Marital status: Married    Spouse name: Not on file   Number of children: Not on file   Years of education: Not on file   Highest education level: Not on file  Occupational History   Not on file  Tobacco Use   Smoking status: Former    Current packs/day: 0.00    Types: Cigarettes    Quit date: 22    Years since quitting: 42.2   Smokeless tobacco: Never  Vaping Use   Vaping status: Never Used   Substance and Sexual Activity   Alcohol use: Not Currently    Alcohol/week: 0.0 standard drinks of alcohol   Drug use: No   Sexual activity: Not on file  Other Topics Concern   Not on file  Social History Narrative   Divorced x1, remarried.  Has 3 daughters from first marriage.   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   Distant tobacco abuse (35 pack-yr hx), quit in the late 1990s, no ETOH or drugs.   No exercise.   Social Drivers of Corporate investment banker Strain: Not on file  Food Insecurity: No Food Insecurity (07/07/2022)   Hunger Vital Sign    Worried About Running Out of Food in the Last Year: Never true    Ran Out of Food in the Last Year: Never true  Transportation Needs: No Transportation Needs (07/07/2022)   PRAPARE - Administrator, Civil Service (Medical): No    Lack of Transportation (Non-Medical): No  Physical Activity:  Not on file  Stress: Not on file  Social Connections: Unknown (05/15/2021)   [facility], [facility]   Social Network    Social Network: Not on file  Intimate Partner Violence: Not At Risk (07/07/2022)   Humiliation, Afraid, Rape, and Kick questionnaire    Fear of Current or Ex-Partner: No    Emotionally Abused: No    Physically Abused: No    Sexually Abused: No     Review of Systems: General: negative for chills, fever, night sweats or weight changes.  Cardiovascular: negative for chest pain, dyspnea on exertion, edema, orthopnea, palpitations, paroxysmal nocturnal dyspnea or shortness of breath Dermatological: negative for rash Respiratory: negative for cough or wheezing Urologic: negative for hematuria Abdominal: negative for nausea, vomiting, diarrhea, bright red blood per rectum, melena, or hematemesis Neurologic: negative for visual changes, syncope, or dizziness All other systems reviewed and  are otherwise negative except as noted above.    Blood pressure (!) 136/56, pulse 91, height 6\' 2"  (1.88 m), weight 257 lb (116.6 kg), SpO2 95%.  General appearance: alert and no distress Neck: no adenopathy, no carotid bruit, no JVD, supple, symmetrical, trachea midline, and thyroid not enlarged, symmetric, no tenderness/mass/nodules Lungs: clear to auscultation bilaterally Heart: regular rate and rhythm, S1, S2 normal, no murmur, click, rub or gallop Extremities: extremities normal, atraumatic, no cyanosis or edema Pulses: 2+ and symmetric Skin: Skin color, texture, turgor normal. No rashes or lesions Neurologic: Grossly normal  EKG EKG Interpretation Date/Time:  Monday March 20 2023 12:09:45 EDT Ventricular Rate:  81 PR Interval:  184 QRS Duration:  114 QT Interval:  386 QTC Calculation: 448 R Axis:   -51  Text Interpretation: Normal sinus rhythm Left anterior fascicular block Minimal voltage criteria for LVH, may be normal variant ( Cornell product ) Nonspecific ST and T wave abnormality When compared with ECG of 20-Jul-2015 16:00, Left anterior fascicular block is now Present Confirmed by Nanetta Batty [phone]) on 03/20/2023 12:20:22 PM    ASSESSMENT AND PLAN:   Aortic stenosis, moderate History of moderate aortic stenosis status post CABG/AVR 06/10/2015.  His most recent 2D echo performed 10/12/2022 revealed normal LV systolic function with a well-functioning aortic bioprosthesis and a mean gradient of 15 mmHg.  This likely is related to valve root mismatch.  Also has mild to moderate MR.  This will be repeated on an annual basis.  Essential hypertension History of essential hypertension her blood pressure measured today at 136/56.  He is on hydrochlorothiazide, and losartan.  Hyperlipidemia History of hyperlipidemia on Repatha with lipid profile performed 1/26 at 24 revealing total cholesterol 121, LDL 46 and HDL 61.  PAD (peripheral artery disease) (HCC) History of critical  of ischemia status post failed attempt at revascularization by Dr. Karin Lieu with ultimate healing of his wound .  Carotid stenosis, right History of carotid artery disease status post carotid stenting which I performed 01/16/2014.  His most recent carotid Doppler studies performed/17/24 revealed a widely patent right carotid stent with moderate left ICA stenosis.  This will be repeated on an annual basis.  CAD (coronary artery disease) History of CAD status post cardiac catheterization by myself 06/22/2015 revealing moderate aortic stenosis with three-vessel disease.  He will ultimately underwent CABG x 3 on 07/09/2015 with a LIMA to his LAD, vein to marginal branch #2 he went to the PDA.  He also had AVR with a 23 mm pericardial Medtronic Magna Ease tissue valve.  He has done well since.  Runell Gess MD FACP,FACC,FAHA, [facility] 03/20/2023 12:26 PM

## 2023-03-20 NOTE — Assessment & Plan Note (Signed)
 History of critical of ischemia status post failed attempt at revascularization by Dr. Karin Lieu with ultimate healing of his wound .

## 2023-03-20 NOTE — Assessment & Plan Note (Signed)
 History of moderate aortic stenosis status post CABG/AVR 06/10/2015.  His most recent 2D echo performed 10/12/2022 revealed normal LV systolic function with a well-functioning aortic bioprosthesis and a mean gradient of 15 mmHg.  This likely is related to valve root mismatch.  Also has mild to moderate MR.  This will be repeated on an annual basis.

## 2023-03-20 NOTE — Assessment & Plan Note (Signed)
 History of carotid artery disease status post carotid stenting which I performed 01/16/2014.  His most recent carotid Doppler studies performed/17/24 revealed a widely patent right carotid stent with moderate left ICA stenosis.  This will be repeated on an annual basis.

## 2023-03-20 NOTE — Assessment & Plan Note (Signed)
 History of CAD status post cardiac catheterization by myself 06/22/2015 revealing moderate aortic stenosis with three-vessel disease.  He will ultimately underwent CABG x 3 on 07/09/2015 with a LIMA to his LAD, vein to marginal branch #2 he went to the PDA.  He also had AVR with a 23 mm pericardial Medtronic Magna Ease tissue valve.  He has done well since.

## 2023-03-20 NOTE — Assessment & Plan Note (Signed)
 History of essential hypertension her blood pressure measured today at 136/56.  He is on hydrochlorothiazide, and losartan.

## 2023-03-20 NOTE — Patient Instructions (Signed)
 Medication Instructions:  Your physician recommends that you continue on your current medications as directed. Please refer to the Current Medication list given to you today.  *If you need a refill on your cardiac medications before your next appointment, please call your pharmacy*   Testing/Procedures: Your physician has requested that you have an echocardiogram. Echocardiography is a painless test that uses sound waves to create images of your heart. It provides your doctor with information about the size and shape of your heart and how well your heart's chambers and valves are working. This procedure takes approximately one hour. There are no restrictions for this procedure. Please do NOT wear cologne, perfume, aftershave, or lotions (deodorant is allowed). Please arrive 15 minutes prior to your appointment time. **To do in October**  Please note: We ask at that you not bring children with you during ultrasound (echo/ vascular) testing. Due to room size and safety concerns, children are not allowed in the ultrasound rooms during exams. Our front office staff cannot provide observation of children in our lobby area while testing is being conducted. An adult accompanying a patient to their appointment will only be allowed in the ultrasound room at the discretion of the ultrasound technician under special circumstances. We apologize for any inconvenience.    Follow-Up: At [facility], you and your health needs are our priority.  As part of our continuing mission to provide you with exceptional heart care, we have created designated Provider Care Teams.  These Care Teams include your primary Cardiologist (physician) and Advanced Practice Providers (APPs -  Physician Assistants and Nurse Practitioners) who all work together to provide you with the care you need, when you need it.  We recommend signing up for the patient portal called "MyChart".  Sign up information is provided on this After  Visit Summary.  MyChart is used to connect with patients for Virtual Visits (Telemedicine).  Patients are able to view lab/test results, encounter notes, upcoming appointments, etc.  Non-urgent messages can be sent to your provider as well.   To learn more about what you can do with MyChart, go to [email or web address].    Your next appointment:   12 month(s)  Provider:   Nanetta Batty, MD     Other Instructions   1st Floor: - Lobby - Registration  - Pharmacy  - Lab - Cafe  2nd Floor: - PV Lab - Diagnostic Testing (echo, CT, nuclear med)  3rd Floor: - Vacant  4th Floor: - TCTS (cardiothoracic surgery) - AFib Clinic - Structural Heart Clinic - Vascular Surgery  - Vascular Ultrasound  5th Floor: - HeartCare Cardiology (general and EP) - Clinical Pharmacy for coumadin, hypertension, lipid, weight-loss medications, and med management appointments    Valet parking services will be available as well.

## 2023-05-10 ENCOUNTER — Encounter: Payer: Self-pay | Admitting: Internal Medicine

## 2023-05-10 ENCOUNTER — Ambulatory Visit: Payer: PPO | Attending: Internal Medicine | Admitting: Internal Medicine

## 2023-05-10 VITALS — BP 130/58 | HR 75 | Ht 74.0 in | Wt 245.0 lb

## 2023-05-10 DIAGNOSIS — E785 Hyperlipidemia, unspecified: Secondary | ICD-10-CM

## 2023-05-10 DIAGNOSIS — I739 Peripheral vascular disease, unspecified: Secondary | ICD-10-CM

## 2023-05-10 DIAGNOSIS — Z951 Presence of aortocoronary bypass graft: Secondary | ICD-10-CM

## 2023-05-10 DIAGNOSIS — M791 Myalgia, unspecified site: Secondary | ICD-10-CM

## 2023-05-10 DIAGNOSIS — T466X5D Adverse effect of antihyperlipidemic and antiarteriosclerotic drugs, subsequent encounter: Secondary | ICD-10-CM

## 2023-05-10 NOTE — Progress Notes (Signed)
 OFFICE NOTE  Chief Complaint:  Lipid clinic follow-up  Primary Care Physician: Podraza, Cole Christopher, PA-C  HPI:  [name] is a 73 y.o. 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  He also underwent bovine pericardial aortic valve replacement at that time.  In addition he has a history of dyslipidemia with statin intolerance.  He reports taking high-dose atorvastatin  and rosuvastatin  which caused both myalgias, lethargy and memory problems.  In addition he said he took ezetimibe  in the past which she thought also cause some memory problems.  He was referred for evaluation of PCSK9 inhibitor.  Actually he was recently started on Repatha  and has had 5 doses of the medication.  Lipid profile 7 months ago indicated total cholesterol 245, triglycerides 113, HDL 58 and LDL 164.  His most recent lipid profile 2 weeks ago showed a total cholesterol 202, triglycerides 70, HDL 75 and LDL 113.  There is concerned about incomplete response to Repatha , but it should be noted that his calculated LDL was lower, but is misrepresented as his HDL fraction has gone up from 58-75.  That being said his actual LDL is probably higher than represented.  He does report compliance with the medication but he says the injector pen does cause him discomfort.  He also uses an insulin  pump and is fairly used to needles.  02/07/2018  [name] is seen today in follow-up.  He is done well with the Repatha  Pushtronex system.  He is now using that once monthly.  He does get some occasional injection site redness with use but typically that improves after 2 days.  His cholesterol profile is also favorably responded to the medication.  His total cholesterol now as of January 12, 2018 is 176, HDL 72, LDL 94 and triglycerides 52.  He denies any other myalgias or other side effects that he had with statins.  He is  actually quite close to goal LDL less than 70.  Interestingly, he has not had a robust response as expected with the Repatha , namely a 60 to 65% reduction although does seem to have a little more benefit on the high once monthly dose.  He previously had been on the every 2 weekly Repatha  dose with suboptimal response, suggesting that there may be PCSK9 or LDL mutation or perhaps a large fraction of his LDL is LP(a).  01/29/2019  [name] returns today for lipid clinic follow-up.  He was previously being seen by Dr. Dean Every however switched his care to Dr. Nydia Belfast at [facility] due to a change in his insurance.  Currently he has [employer].  Despite this, he wishes to continue to follow here for lipid management.  Recently he had lipids drawn at [facility], those were in November 2020.  Total cholesterol was 150, triglycerides 58, HDL 68 and a direct LDL was 81.  He remains on the Repatha  Pushtronex system.  At some point he notes that he was started on Vascepa.  He was advised to take 2 g twice daily which is the recommended dose however he has been taking only 1 g daily.  After extensive review of his previous lipids however I do not see any evidence that he has had elevated triglycerides over 150, which means use of Vascepa is not based on any guideline directed indications or supported by current literature.  In fact  his low triglycerides are not predictive of further increase cardiovascular risk.  03/16/2020  [name] returns today for follow-up.  He is back to see Dr. Katheryne Pane due to his insurance switch.  He seems to be doing well on the Repatha  Pushtronex system.  We will need to reauthorize that.  Hopefully we can get it to him at low to no cost.  We had previously discussed that he was not quite to target LDL.  His direct LDL was 81 however recent labs showed total cholesterol 182, triglycerides 80, HDL 61 and LDL 98.  Would like to see his LDL cholesterol below 100.  We discussed  possible additional therapies and I would prefer adding low-dose statin since most of the data for PCSK9 inhibitors were on top of statin therapy.  He had previously not been able to tolerate statins due to issues with memory and leg pains as well as lethargy, however this was at much higher doses  03/16/2021  [name] is seen today in follow-up.  He recently saw Dr. Katheryne Pane as well who is ordered repeat Dopplers.  His cholesterol remains very well controlled in fact lower than it has been in the past.  Total now 123, triglycerides 47, HDL 67 LDL 45.  He seems to be doing well with Repatha  Pushtronex system management of his rosuvastatin .  04/04/2022  [name] is seen today in follow-up.  Overall he is doing well.  He is tolerating the Repatha  Pushtronex without issues.  He has been on low-dose rosuvastatin .  He thought recently that he may be having some worsening memory issues on that.  He says he stopped it for the past month but has not noticed a significant change in the memory issues.  He did have recent cholesterol testing.  Lipids in January, prior to stopping the rosuvastatin  showed total cholesterol 121, triglycerides 66, HDL 61 and LDL 46.  05/10/2023  [name] is seen today in follow-up.  He is followed by Dr. Katheryne Pane and was seen a couple months ago.  He had stopped his statin due to concern about side effects.  He remains on Repatha  every 2 weeks.  He has not had lipid testing since January 2024.  PMHx:  Past Medical History:  Diagnosis Date   Aortic stenosis, moderate Sept/Oct /2013   2017-> s/p bioprosthetic AV [facility] Ease bovine pericardial valve model 3300 TFX, ser # [id number]).   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 Inferolateral perfusion defect + EKG abnormality during stress testing prompted Cath by [facility]: 3V CAD, EF normal.  Med mgmt recommended; b. 06/2015 Cath: 3VD and mod AS; c. 07/2015 CABG x 3 (LIMA->LAD, VG->OM2, VG->PDA) w/ AVR.   Carotid stenosis, right 09/2011    Dr. Katheryne Pane did R carotid stenting 01/2014.  Carotid dopplers 06/2015 essentially normal.  No change 07/2016--repeat 1 yr.   Complication of anesthesia    difficulty with intubation,    Decreased pedal pulses    LE doppler 11/08/11- no evidence of arterial insufficiency   Diabetes mellitus Dx'd age 65   Novolog  via insulin  pump; sub-optimal control x years   Diabetic neuropathy (HCC)    fine touch and position sense affected   Diabetic retinopathy    Proliferative: Hx of retinal detachment on right-light perception only in right eye   Diastolic dysfunction 2007; 2016   TEE with diastolic dysfunction, EF 74%.   Difficult intubation    ~ 2002 difficult fiberoptic intubation with takeback bleeding s/p thyroidectomy;  for  thyroidectomy was intubated DL X 1 with cricoid pressure but difficult mask (full beard)   Erectile dysfunction    [place]   GERD (gastroesophageal reflux disease)    Heart murmur    History of tobacco abuse    Quit about 1990 (has 35 pack-yr hx)   Hyperlipemia, mixed    a. intolerant of statins/zeta; per Dr. Katheryne Pane 12/2016, pt started on PCSK9--incomplete responder?---01/2017.   Hypothyroidism, postsurgical    Thyroidectomy 2002; multinodular goiter.  Dr. Aldona Amel managing this as of 10/2015.   Impingement syndrome of right shoulder    08/26/15 Pt got subacromial steroid injection by orthopedist (Dr. Karie Ou).  [facility] f/u 01/2016--MRI showed RC tear + AC joint arthritis, arthroscopic surgery done 03/2016 ([facility]).   OSA (obstructive sleep apnea)    06/2011 sleep study: moderate OSA, CPAP at 12 cm H2O.   Osteoarthritis    Bilat thumb carpometacarpal joints.  Ortho injected each thumb x 2 in 2017.  Also injected 03/14/17.   Recurrent pneumonia    TIA (transient ischemic attack) 05/2015   Left brain (right sided numbness + slurred speech)  Admitted for obs/workup 05/2015.   Venous reflux 10/14/2011   venous doppler-R GSV continuous reflux  throughout; too small for VNUS closure    Past Surgical History:  Procedure Laterality Date   ABDOMINAL AORTOGRAM W/LOWER EXTREMITY N/A 10/27/2020   Procedure: ABDOMINAL AORTOGRAM W/LOWER EXTREMITY;  Surgeon: Kayla Part, MD;  Location: [facility] INVASIVE CV LAB;  Service: Vascular;  Laterality: N/A;   ABI  01/2016   Normal.  Dr. Katheryne Pane to repeat in 1 yr.   AORTIC VALVE REPLACEMENT N/A 07/09/2015   Bovine pericardial valve.  Procedure: AORTIC VALVE REPLACEMENT (AVR);  Surgeon: Zelphia Higashi, MD;  Location: [facility] OR;  Service: Open Heart Surgery;  Laterality: N/A;   CARDIAC CATHETERIZATION  10/2011   EF normal.  Diffuse 3 vessel CAD, no stents placed.  Medical mgmt per [facility].   CARDIAC CATHETERIZATION N/A 06/22/2015   Procedure: Right/Left Heart Cath and Coronary Angiography;  Surgeon: Avanell Leigh, MD;  Location: [facility] INVASIVE CV LAB;  Service: Cardiovascular;  Laterality: N/A;   carotid dopplers  06/30/15   Normal: repeat when clinically indicated per Dr. Katheryne Pane   CAROTID STENT INSERTION Right 01/16/2014   Procedure: CAROTID STENT INSERTION;  Surgeon: Avanell Leigh, MD;  Location: [facility] CATH LAB;  Service: Cardiovascular;  Laterality: Right;   CATARACT EXTRACTION     left   CORONARY ARTERY BYPASS GRAFT N/A 07/09/2015   Procedure: CORONARY ARTERY BYPASS GRAFTING (CABG)TIMES THREE USING LEFT INTERNAL MAMMARY ARTERY AND LEFT SAPHENOUS LEG VEIN HARVESTED ENDOSCOPICALLY;  Surgeon: Zelphia Higashi, MD;  Location: [facility] OR;  Service: Open Heart Surgery;  Laterality: N/A;   EYE SURGERY     Multiple laser surgeries for diabetic retinopathy, also cataract surgery OU.   HARDWARE REMOVAL Right 05/28/2020   Procedure: HARDWARE REMOVAL AND EXCHANGE RIGHT ANKLE;  Surgeon: Donnamarie Gables, MD;  Location: [facility] OR;  Service: Orthopedics;  Laterality: Right;   INTRAOPERATIVE TRANSESOPHAGEAL ECHOCARDIOGRAM N/A 07/09/2015   Procedure: INTRAOPERATIVE TRANSESOPHAGEAL ECHOCARDIOGRAM;  Surgeon: Zelphia Higashi, MD;  Location: [facility] OR;  Service: Open Heart Surgery;  Laterality: N/A;   lasix  eye     LEFT AND RIGHT HEART CATHETERIZATION WITH CORONARY ANGIOGRAM N/A 10/28/2011   Procedure: LEFT AND R0IHT HEART CATHETERIZATION WITH CORONARY ANGIOGRAM;  Surgeon: Millicent Ally, MD;  Location: [facility] CATH LAB;  Service: Cardiovascular;  Laterality: N/A;  ORIF ANKLE FRACTURE Right 07/23/2019   Procedure: OPEN REDUCTION RIGHT ANKLE DISLOCATION WITH REPAIR, OPEN TREATMENT OF TIBIAL PLAFOND FRACTURE WITH LATERAL MALLEOLUS, OPEN TREATMENT OF SYNDESMOSIS;  Surgeon: Donnamarie Gables, MD;  Location: [facility] OR;  Service: Orthopedics;  Laterality: Right;  LENGTH OF SURGERY: 2.5 HOURS   RETINAL DETACHMENT SURGERY     Right eye   ROTATOR CUFF REPAIR W/ DISTAL CLAVICLE EXCISION Right 03/2016   Arthroscopic ([facility])   THYROID  SURGERY  2002   TIBIA IM NAIL INSERTION Right 05/28/2020   Procedure: INTRAMEDULLARY NAIL RIGHT TIBIA;  Surgeon: Donnamarie Gables, MD;  Location: [facility] OR;  Service: Orthopedics;  Laterality: Right;   TRANSTHORACIC ECHOCARDIOGRAM  10/2011;12/2012;01/2015; 08/2015; 07/28/16   Mod AS, mild LVH, EF 60-65%, no RWMA.  01/2015 EF 60-65%, grade I DD, progression of mod/sev AS.  08/2015 normal lv fxn with normal fxning bioprosthetic Ao valve.  07/2016--no change compared to 2017 echo.      FAMHx:  Family History  Problem Relation Age of Onset   Cancer Mother        lung cancer   Alcohol abuse Father    Cancer Father        laryngeal cancer   Cancer Brother        oldest brother had lung cancer and melanoma    SOCHx:   reports that he quit smoking about 42 years ago. His smoking use included cigarettes. He has never used smokeless tobacco. He reports that he does not currently use alcohol. He reports that he does not use drugs.  ALLERGIES:  Allergies  Allergen Reactions   Crestor  [Rosuvastatin  Calcium ] Other (See Comments)    Problem with higher dosages (Lipitor caused memory  issues) Myalgias Lethargy   Statins Other (See Comments)    Problem with higher dosages (Lipitor caused memory issues) Myalgias Lethargy   Tape Rash and Other (See Comments)    Adhesive tape.  (Paper tape OK)    ROS: Pertinent items noted in HPI and remainder of comprehensive ROS otherwise negative.  HOME MEDS: Current Outpatient Medications on File Prior to Visit  Medication Sig Dispense Refill   acetaminophen  (TYLENOL ) 500 MG tablet Take 1,000 mg by mouth daily as needed for headache, fever or moderate pain.     aspirin  EC 81 MG tablet Take 81 mg by mouth every evening.     cetirizine (ZYRTEC) 10 MG tablet Take 10 mg by mouth every evening.     clopidogrel  (PLAVIX ) 75 MG tablet Take 1 tablet (75 mg total) by mouth daily at 12 noon.     clotrimazole -betamethasone  (LOTRISONE ) cream Apply 1 Application topically as needed.     Evolocumab  (REPATHA  SURECLICK) 140 MG/ML SOAJ Inject 140 mg into the skin every 14 (fourteen) days. 6 mL 1   hydrochlorothiazide (HYDRODIURIL) 12.5 MG tablet Take 12.5 mg by mouth daily.     insulin  lispro (HUMALOG ) 100 UNIT/ML injection Use 60 units via insulin  pump. (Patient taking differently: Inject 100 Units into the skin See admin instructions. Max daily dose 100 units via insulin  pump. Continuous.) 90 mL 2   levothyroxine  (SYNTHROID ) 150 MCG tablet Take 150 mcg by mouth every morning.     loperamide (IMODIUM A-D) 2 MG tablet Take 2 mg by mouth every evening.     losartan (COZAAR) 25 MG tablet Take 25 mg by mouth daily.     losartan-hydrochlorothiazide (HYZAAR) 50-12.5 MG tablet Take 1 tablet by mouth every evening.     Multiple Vitamin (MULTI-VITAMIN) tablet Take 1  tablet by mouth daily.     Multiple Vitamins-Minerals (ONE-A-DAY MENS 50+) TABS Take 1 tablet by mouth every evening.     omeprazole  (PRILOSEC  OTC) 20 MG tablet Take 20 mg by mouth every evening.     silver sulfADIAZINE (SILVADENE) 1 % cream Apply 1 Application topically as needed.      tamsulosin  (FLOMAX ) 0.4 MG CAPS capsule Take 0.4 mg by mouth every evening.     No current facility-administered medications on file prior to visit.    LABS/IMAGING: No results found for this or any previous visit (from the past 48 hours).  No results found.  LIPID PANEL:    Component Value Date/Time   CHOL 121 01/28/2022 0804   TRIG 66 01/28/2022 0804   HDL 61 01/28/2022 0804   CHOLHDL 2.0 01/28/2022 0804   CHOLHDL 4.0 05/07/2015 0557   VLDL 15 05/07/2015 0557   LDLCALC 46 01/28/2022 0804   LDLDIRECT 159.5 04/21/2011 0843     WEIGHTS: Wt Readings from Last 3 Encounters:  05/10/23 245 lb (111.1 kg)  03/20/23 257 lb (116.6 kg)  07/07/22 235 lb (106.6 kg)    VITALS: BP (!) 130/58 (BP Location: Left Arm, Patient Position: Sitting, Cuff Size: Large)   Pulse 75   Ht 6\' 2"  (1.88 m)   Wt 245 lb (111.1 kg)   SpO2 95%   BMI 31.46 kg/m   EXAM: Deferred  EKG: Deferred  ASSESSMENT: Mixed dyslipidemia, goal LDL less than 70 ASCVD status post 3 vessel CABG and bioprosthetic AVR (2017) Statin intolerance - myalgias PAD IDDM  PLAN: 1.   [name] is overdue for lipid testing.  He says that his Repatha  was recently renewed but he does need updated lipids.  He had stopped his statin due to intolerance and may need additional therapy to lower him to target less than 70 if he is above that.  Also check an LP(a) today.  He has follow-up with Dr. Katheryne Pane regarding general cardiology issues.  Follow-up annually with our lipid APP or sooner as necessary.  Hazle Lites, MD, [facility], FNLA, FACP  [place]  [facility]  Medical Director of the Advanced Lipid Disorders &  Cardiovascular Risk Reduction Clinic Diplomate of the American Board of Clinical Lipidology Attending Cardiologist  Direct Dial: [phone]  Fax: [phone]  Website:  [email or web address] 05/10/2023, 8:44 AM

## 2023-05-10 NOTE — Patient Instructions (Signed)
 Medication Instructions:  NO CHANGES  *If you need a refill on your cardiac medications before your next appointment, please call your pharmacy*  Lab Work: Lipid Panel and LPa today  1st Floor  If you have labs (blood work) drawn today and your tests are completely normal, you will receive your results only by: MyChart Message (if you have MyChart) OR A paper copy in the mail If you have any lab test that is abnormal or we need to change your treatment, we will call you to review the results.   Follow-Up: At [facility], you and your health needs are our priority.  As part of our continuing mission to provide you with exceptional heart care, our providers are all part of one team.  This team includes your primary Cardiologist (physician) and Advanced Practice Providers or APPs (Physician Assistants and Nurse Practitioners) who all work together to provide you with the care you need, when you need it.  Your next appointment:   12 months with Slater Duncan, NP -- lipid clinic  We recommend signing up for the patient portal called "MyChart".  Sign up information is provided on this After Visit Summary.  MyChart is used to connect with patients for Virtual Visits (Telemedicine).  Patients are able to view lab/test results, encounter notes, upcoming appointments, etc.  Non-urgent messages can be sent to your provider as well.   To learn more about what you can do with MyChart, go to [email or web address].

## 2023-05-11 LAB — LIPID PANEL
Chol/HDL Ratio: 2.6 ratio (ref 0.0–5.0)
Cholesterol, Total: 138 mg/dL (ref 100–199)
HDL: 53 mg/dL (ref >39)
LDL Chol Calc (NIH): 70 mg/dL (ref 0–99)
Triglycerides: 78 mg/dL (ref 0–149)
VLDL Cholesterol Cal: 15 mg/dL (ref 5–40)

## 2023-05-11 LAB — LIPOPROTEIN A (LPA): Lipoprotein (a): 314.4 nmol/L — ABNORMAL HIGH (ref <75.0)

## 2023-05-16 ENCOUNTER — Ambulatory Visit (HOSPITAL_BASED_OUTPATIENT_CLINIC_OR_DEPARTMENT_OTHER): Payer: Self-pay | Admitting: Internal Medicine

## 2023-05-17 ENCOUNTER — Ambulatory Visit (HOSPITAL_COMMUNITY)
Admission: RE | Admit: 2023-05-17 | Discharge: 2023-05-17 | Disposition: A | Discharge status: Home / Self Care | Source: Ambulatory Visit | Attending: Cardiovascular Disease | Admitting: Cardiovascular Disease

## 2023-05-17 ENCOUNTER — Ambulatory Visit: Payer: Self-pay | Admitting: Cardiovascular Disease

## 2023-05-17 DIAGNOSIS — I6522 Occlusion and stenosis of left carotid artery: Secondary | ICD-10-CM | POA: Diagnosis present

## 2023-06-15 LAB — COLOGUARD: COLOGUARD: NEGATIVE

## 2023-08-22 ENCOUNTER — Other Ambulatory Visit: Payer: Self-pay | Admitting: Internal Medicine

## 2023-08-22 DIAGNOSIS — I739 Peripheral vascular disease, unspecified: Secondary | ICD-10-CM

## 2023-08-22 DIAGNOSIS — T466X5A Adverse effect of antihyperlipidemic and antiarteriosclerotic drugs, initial encounter: Secondary | ICD-10-CM

## 2023-08-22 DIAGNOSIS — Z951 Presence of aortocoronary bypass graft: Secondary | ICD-10-CM

## 2023-08-22 DIAGNOSIS — E785 Hyperlipidemia, unspecified: Secondary | ICD-10-CM

## 2023-10-11 ENCOUNTER — Ambulatory Visit (HOSPITAL_COMMUNITY)
Admission: RE | Admit: 2023-10-11 | Discharge: 2023-10-11 | Disposition: A | Discharge status: Home / Self Care | Source: Ambulatory Visit | Attending: Cardiology | Admitting: Cardiology

## 2023-10-11 DIAGNOSIS — I350 Nonrheumatic aortic (valve) stenosis: Secondary | ICD-10-CM | POA: Diagnosis not present

## 2023-10-11 DIAGNOSIS — Z951 Presence of aortocoronary bypass graft: Secondary | ICD-10-CM | POA: Diagnosis present

## 2023-10-11 DIAGNOSIS — E785 Hyperlipidemia, unspecified: Secondary | ICD-10-CM | POA: Diagnosis present

## 2023-10-11 DIAGNOSIS — Z952 Presence of prosthetic heart valve: Secondary | ICD-10-CM | POA: Diagnosis not present

## 2023-10-11 DIAGNOSIS — I10 Essential (primary) hypertension: Secondary | ICD-10-CM | POA: Diagnosis not present

## 2023-10-11 LAB — ECHOCARDIOGRAM COMPLETE
AR max vel: 1.27 cm2
AV Area VTI: 1.22 cm2
AV Area mean vel: 1.26 cm2
AV Mean grad: 19 mmHg
AV Peak grad: 29.2 mmHg
Ao pk vel: 2.7 m/s
Area-P 1/2: 5.79 cm2
S' Lateral: 3.2 cm

## 2023-10-12 ENCOUNTER — Ambulatory Visit: Payer: Self-pay | Admitting: Cardiovascular Disease

## 2023-10-22 ENCOUNTER — Other Ambulatory Visit: Payer: Self-pay

## 2023-10-22 DIAGNOSIS — N1831 Chronic kidney disease, stage 3a: Secondary | ICD-10-CM | POA: Diagnosis present

## 2023-10-22 DIAGNOSIS — L03115 Cellulitis of right lower limb: Secondary | ICD-10-CM | POA: Diagnosis present

## 2023-10-22 DIAGNOSIS — E10319 Type 1 diabetes mellitus with unspecified diabetic retinopathy without macular edema: Secondary | ICD-10-CM | POA: Diagnosis present

## 2023-10-22 DIAGNOSIS — A419 Sepsis, unspecified organism: Principal | ICD-10-CM | POA: Diagnosis present

## 2023-10-22 DIAGNOSIS — Z888 Allergy status to other drugs, medicaments and biological substances status: Secondary | ICD-10-CM

## 2023-10-22 DIAGNOSIS — Z91048 Other nonmedicinal substance allergy status: Secondary | ICD-10-CM

## 2023-10-22 DIAGNOSIS — E1040 Type 1 diabetes mellitus with diabetic neuropathy, unspecified: Secondary | ICD-10-CM | POA: Diagnosis present

## 2023-10-22 DIAGNOSIS — E785 Hyperlipidemia, unspecified: Secondary | ICD-10-CM | POA: Diagnosis present

## 2023-10-22 DIAGNOSIS — Z951 Presence of aortocoronary bypass graft: Secondary | ICD-10-CM

## 2023-10-22 DIAGNOSIS — Z953 Presence of xenogenic heart valve: Secondary | ICD-10-CM

## 2023-10-22 DIAGNOSIS — K219 Gastro-esophageal reflux disease without esophagitis: Secondary | ICD-10-CM | POA: Diagnosis present

## 2023-10-22 DIAGNOSIS — R7982 Elevated C-reactive protein (CRP): Secondary | ICD-10-CM | POA: Diagnosis present

## 2023-10-22 DIAGNOSIS — Z7982 Long term (current) use of aspirin: Secondary | ICD-10-CM

## 2023-10-22 DIAGNOSIS — L0390 Cellulitis, unspecified: Secondary | ICD-10-CM | POA: Diagnosis not present

## 2023-10-22 DIAGNOSIS — E1022 Type 1 diabetes mellitus with diabetic chronic kidney disease: Secondary | ICD-10-CM | POA: Diagnosis present

## 2023-10-22 DIAGNOSIS — G4733 Obstructive sleep apnea (adult) (pediatric): Secondary | ICD-10-CM | POA: Diagnosis present

## 2023-10-22 DIAGNOSIS — N529 Male erectile dysfunction, unspecified: Secondary | ICD-10-CM | POA: Diagnosis present

## 2023-10-22 DIAGNOSIS — L97419 Non-pressure chronic ulcer of right heel and midfoot with unspecified severity: Secondary | ICD-10-CM | POA: Diagnosis present

## 2023-10-22 DIAGNOSIS — I130 Hypertensive heart and chronic kidney disease with heart failure and stage 1 through stage 4 chronic kidney disease, or unspecified chronic kidney disease: Secondary | ICD-10-CM | POA: Diagnosis present

## 2023-10-22 DIAGNOSIS — Z87891 Personal history of nicotine dependence: Secondary | ICD-10-CM

## 2023-10-22 DIAGNOSIS — Z8673 Personal history of transient ischemic attack (TIA), and cerebral infarction without residual deficits: Secondary | ICD-10-CM

## 2023-10-22 DIAGNOSIS — Z79890 Hormone replacement therapy (postmenopausal): Secondary | ICD-10-CM

## 2023-10-22 DIAGNOSIS — E1065 Type 1 diabetes mellitus with hyperglycemia: Secondary | ICD-10-CM | POA: Diagnosis present

## 2023-10-22 DIAGNOSIS — I5032 Chronic diastolic (congestive) heart failure: Secondary | ICD-10-CM | POA: Diagnosis present

## 2023-10-22 DIAGNOSIS — Z9641 Presence of insulin pump (external) (internal): Secondary | ICD-10-CM | POA: Diagnosis present

## 2023-10-22 DIAGNOSIS — E039 Hypothyroidism, unspecified: Secondary | ICD-10-CM | POA: Diagnosis present

## 2023-10-22 DIAGNOSIS — I350 Nonrheumatic aortic (valve) stenosis: Secondary | ICD-10-CM | POA: Diagnosis present

## 2023-10-22 DIAGNOSIS — Z7902 Long term (current) use of antithrombotics/antiplatelets: Secondary | ICD-10-CM

## 2023-10-22 DIAGNOSIS — E10621 Type 1 diabetes mellitus with foot ulcer: Secondary | ICD-10-CM | POA: Diagnosis present

## 2023-10-22 DIAGNOSIS — I878 Other specified disorders of veins: Secondary | ICD-10-CM | POA: Diagnosis present

## 2023-10-22 DIAGNOSIS — I2510 Atherosclerotic heart disease of native coronary artery without angina pectoris: Secondary | ICD-10-CM | POA: Diagnosis present

## 2023-10-22 DIAGNOSIS — Z794 Long term (current) use of insulin: Secondary | ICD-10-CM

## 2023-10-22 NOTE — ED Triage Notes (Signed)
 Pt c/o chills, fever, RLE swelling, redness first noticed this evening. Pt advises temp as high as 103.6, hovering between 101-103.  Tylenol  PTA

## 2023-10-23 ENCOUNTER — Emergency Department (HOSPITAL_BASED_OUTPATIENT_CLINIC_OR_DEPARTMENT_OTHER)

## 2023-10-23 ENCOUNTER — Inpatient Hospital Stay (HOSPITAL_COMMUNITY)

## 2023-10-23 ENCOUNTER — Encounter (HOSPITAL_BASED_OUTPATIENT_CLINIC_OR_DEPARTMENT_OTHER): Payer: Self-pay

## 2023-10-23 ENCOUNTER — Inpatient Hospital Stay (HOSPITAL_BASED_OUTPATIENT_CLINIC_OR_DEPARTMENT_OTHER)
Admission: EM | Admit: 2023-10-23 | Discharge: 2023-10-26 | DRG: 872 | Disposition: A | Discharge status: Home / Self Care | Attending: Internal Medicine | Admitting: Internal Medicine

## 2023-10-23 DIAGNOSIS — M7989 Other specified soft tissue disorders: Secondary | ICD-10-CM | POA: Diagnosis not present

## 2023-10-23 DIAGNOSIS — I2583 Coronary atherosclerosis due to lipid rich plaque: Secondary | ICD-10-CM | POA: Diagnosis not present

## 2023-10-23 DIAGNOSIS — I350 Nonrheumatic aortic (valve) stenosis: Secondary | ICD-10-CM

## 2023-10-23 DIAGNOSIS — E1065 Type 1 diabetes mellitus with hyperglycemia: Secondary | ICD-10-CM | POA: Diagnosis present

## 2023-10-23 DIAGNOSIS — I2510 Atherosclerotic heart disease of native coronary artery without angina pectoris: Secondary | ICD-10-CM | POA: Diagnosis present

## 2023-10-23 DIAGNOSIS — L97419 Non-pressure chronic ulcer of right heel and midfoot with unspecified severity: Secondary | ICD-10-CM | POA: Diagnosis present

## 2023-10-23 DIAGNOSIS — I10 Essential (primary) hypertension: Secondary | ICD-10-CM | POA: Diagnosis not present

## 2023-10-23 DIAGNOSIS — I5032 Chronic diastolic (congestive) heart failure: Secondary | ICD-10-CM | POA: Diagnosis present

## 2023-10-23 DIAGNOSIS — E039 Hypothyroidism, unspecified: Secondary | ICD-10-CM | POA: Diagnosis present

## 2023-10-23 DIAGNOSIS — E10621 Type 1 diabetes mellitus with foot ulcer: Secondary | ICD-10-CM | POA: Diagnosis present

## 2023-10-23 DIAGNOSIS — Z953 Presence of xenogenic heart valve: Secondary | ICD-10-CM | POA: Diagnosis not present

## 2023-10-23 DIAGNOSIS — N1831 Chronic kidney disease, stage 3a: Secondary | ICD-10-CM | POA: Diagnosis present

## 2023-10-23 DIAGNOSIS — L0390 Cellulitis, unspecified: Secondary | ICD-10-CM | POA: Diagnosis present

## 2023-10-23 DIAGNOSIS — E1042 Type 1 diabetes mellitus with diabetic polyneuropathy: Secondary | ICD-10-CM | POA: Diagnosis not present

## 2023-10-23 DIAGNOSIS — E785 Hyperlipidemia, unspecified: Secondary | ICD-10-CM | POA: Diagnosis present

## 2023-10-23 DIAGNOSIS — A419 Sepsis, unspecified organism: Principal | ICD-10-CM

## 2023-10-23 DIAGNOSIS — Z79890 Hormone replacement therapy (postmenopausal): Secondary | ICD-10-CM | POA: Diagnosis not present

## 2023-10-23 DIAGNOSIS — Z952 Presence of prosthetic heart valve: Secondary | ICD-10-CM

## 2023-10-23 DIAGNOSIS — Z87891 Personal history of nicotine dependence: Secondary | ICD-10-CM | POA: Diagnosis not present

## 2023-10-23 DIAGNOSIS — I639 Cerebral infarction, unspecified: Secondary | ICD-10-CM | POA: Diagnosis present

## 2023-10-23 DIAGNOSIS — L03115 Cellulitis of right lower limb: Secondary | ICD-10-CM | POA: Diagnosis present

## 2023-10-23 DIAGNOSIS — Z951 Presence of aortocoronary bypass graft: Secondary | ICD-10-CM | POA: Diagnosis not present

## 2023-10-23 DIAGNOSIS — Z7902 Long term (current) use of antithrombotics/antiplatelets: Secondary | ICD-10-CM | POA: Diagnosis not present

## 2023-10-23 DIAGNOSIS — R7982 Elevated C-reactive protein (CRP): Secondary | ICD-10-CM | POA: Diagnosis present

## 2023-10-23 DIAGNOSIS — Z794 Long term (current) use of insulin: Secondary | ICD-10-CM | POA: Diagnosis not present

## 2023-10-23 DIAGNOSIS — E10319 Type 1 diabetes mellitus with unspecified diabetic retinopathy without macular edema: Secondary | ICD-10-CM | POA: Diagnosis present

## 2023-10-23 DIAGNOSIS — E1040 Type 1 diabetes mellitus with diabetic neuropathy, unspecified: Secondary | ICD-10-CM | POA: Diagnosis present

## 2023-10-23 DIAGNOSIS — Z9641 Presence of insulin pump (external) (internal): Secondary | ICD-10-CM | POA: Diagnosis present

## 2023-10-23 DIAGNOSIS — I130 Hypertensive heart and chronic kidney disease with heart failure and stage 1 through stage 4 chronic kidney disease, or unspecified chronic kidney disease: Secondary | ICD-10-CM | POA: Diagnosis present

## 2023-10-23 DIAGNOSIS — G4733 Obstructive sleep apnea (adult) (pediatric): Secondary | ICD-10-CM | POA: Diagnosis present

## 2023-10-23 DIAGNOSIS — E1059 Type 1 diabetes mellitus with other circulatory complications: Secondary | ICD-10-CM | POA: Diagnosis present

## 2023-10-23 DIAGNOSIS — E1022 Type 1 diabetes mellitus with diabetic chronic kidney disease: Secondary | ICD-10-CM | POA: Diagnosis present

## 2023-10-23 LAB — BASIC METABOLIC PANEL WITH GFR
Anion gap: 10 (ref 5–15)
BUN: 27 mg/dL — ABNORMAL HIGH (ref 8–23)
CO2: 25 mmol/L (ref 22–32)
Calcium: 8.9 mg/dL (ref 8.9–10.3)
Chloride: 103 mmol/L (ref 98–111)
Creatinine, Ser: 1.25 mg/dL — ABNORMAL HIGH (ref 0.61–1.24)
GFR, Estimated: >60 mL/min (ref >60)
Glucose, Bld: 129 mg/dL — ABNORMAL HIGH (ref 70–99)
Potassium: 4.6 mmol/L (ref 3.5–5.1)
Sodium: 139 mmol/L (ref 135–145)

## 2023-10-23 LAB — RESP PANEL BY RT-PCR (RSV, FLU A&B, COVID)  RVPGX2
Influenza A by PCR: NEGATIVE
Influenza B by PCR: NEGATIVE
Resp Syncytial Virus by PCR: NEGATIVE
SARS Coronavirus 2 by RT PCR: NEGATIVE

## 2023-10-23 LAB — URINALYSIS, ROUTINE W REFLEX MICROSCOPIC
Bilirubin Urine: NEGATIVE
Glucose, UA: NEGATIVE mg/dL
Hgb urine dipstick: NEGATIVE
Ketones, ur: NEGATIVE mg/dL
Leukocytes,Ua: NEGATIVE
Nitrite: NEGATIVE
Protein, ur: NEGATIVE mg/dL
Specific Gravity, Urine: 1.011 (ref 1.005–1.030)
pH: 5 (ref 5.0–8.0)

## 2023-10-23 LAB — CBC WITH DIFFERENTIAL/PLATELET
Abs Immature Granulocytes: 0.16 K/uL — ABNORMAL HIGH (ref 0.00–0.07)
Basophils Absolute: 0 K/uL (ref 0.0–0.1)
Basophils Relative: 0 %
Eosinophils Absolute: 0 K/uL (ref 0.0–0.5)
Eosinophils Relative: 0 %
HCT: 34.8 % — ABNORMAL LOW (ref 39.0–52.0)
Hemoglobin: 12 g/dL — ABNORMAL LOW (ref 13.0–17.0)
Immature Granulocytes: 1 %
Lymphocytes Relative: 3 %
Lymphs Abs: 0.6 K/uL — ABNORMAL LOW (ref 0.7–4.0)
MCH: 30.8 pg (ref 26.0–34.0)
MCHC: 34.5 g/dL (ref 30.0–36.0)
MCV: 89.5 fL (ref 80.0–100.0)
Monocytes Absolute: 0.9 K/uL (ref 0.1–1.0)
Monocytes Relative: 5 %
Neutro Abs: 17.4 K/uL — ABNORMAL HIGH (ref 1.7–7.7)
Neutrophils Relative %: 91 %
Platelets: 206 K/uL (ref 150–400)
RBC: 3.89 MIL/uL — ABNORMAL LOW (ref 4.22–5.81)
RDW: 12.6 % (ref 11.5–15.5)
WBC: 19.1 K/uL — ABNORMAL HIGH (ref 4.0–10.5)
nRBC: 0 % (ref 0.0–0.2)

## 2023-10-23 LAB — HEMOGLOBIN A1C
Hgb A1c MFr Bld: 7 % — ABNORMAL HIGH (ref 4.8–5.6)
Mean Plasma Glucose: 154.2 mg/dL

## 2023-10-23 LAB — LACTIC ACID, PLASMA
Lactic Acid, Venous: 1.2 mmol/L (ref 0.5–1.9)
Lactic Acid, Venous: 1 mmol/L (ref 0.5–1.9)

## 2023-10-23 LAB — C-REACTIVE PROTEIN: CRP: 13.5 mg/dL — ABNORMAL HIGH (ref <1.0)

## 2023-10-23 LAB — GLUCOSE, CAPILLARY
Glucose-Capillary: 97 mg/dL (ref 70–99)
Glucose-Capillary: 133 mg/dL — ABNORMAL HIGH (ref 70–99)

## 2023-10-23 LAB — SEDIMENTATION RATE: Sed Rate: 40 mm/h — ABNORMAL HIGH (ref 0–16)

## 2023-10-23 LAB — PREALBUMIN: Prealbumin: 16 mg/dL — ABNORMAL LOW (ref 18–38)

## 2023-10-23 LAB — CBG MONITORING, ED: Glucose-Capillary: 239 mg/dL — ABNORMAL HIGH (ref 70–99)

## 2023-10-23 MED ORDER — LOSARTAN POTASSIUM 25 MG PO TABS
25.0000 mg | ORAL_TABLET | Freq: Every day | ORAL | Status: DC
  Administered 2023-10-23 – 2023-10-26 (×4): 25 mg via ORAL
  Filled 2023-10-23 (×4): qty 1

## 2023-10-23 MED ORDER — CLOPIDOGREL BISULFATE 75 MG PO TABS
75.0000 mg | ORAL_TABLET | Freq: Every day | ORAL | Status: DC
  Administered 2023-10-24 – 2023-10-26 (×3): 75 mg via ORAL
  Filled 2023-10-23 (×4): qty 1

## 2023-10-23 MED ORDER — ONDANSETRON HCL 4 MG PO TABS: 4.0000 mg | ORAL_TABLET | Freq: Four times a day (QID) | ORAL | Status: DC | PRN

## 2023-10-23 MED ORDER — OMEPRAZOLE MAGNESIUM 20 MG PO TBEC: 20.0000 mg | DELAYED_RELEASE_TABLET | Freq: Every evening | ORAL | Status: DC

## 2023-10-23 MED ORDER — PANTOPRAZOLE SODIUM 40 MG PO TBEC
40.0000 mg | DELAYED_RELEASE_TABLET | Freq: Every evening | ORAL | Status: DC
  Administered 2023-10-23 – 2023-10-25 (×3): 40 mg via ORAL
  Filled 2023-10-23 (×3): qty 1

## 2023-10-23 MED ORDER — LOPERAMIDE HCL 2 MG PO CAPS
2.0000 mg | ORAL_CAPSULE | ORAL | Status: DC | PRN
  Administered 2023-10-24 – 2023-10-26 (×8): 2 mg via ORAL
  Filled 2023-10-23 (×9): qty 1

## 2023-10-23 MED ORDER — ACETAMINOPHEN 325 MG PO TABS
650.0000 mg | ORAL_TABLET | Freq: Once | ORAL | Status: AC
  Administered 2023-10-23: 650 mg via ORAL
  Filled 2023-10-23: qty 2

## 2023-10-23 MED ORDER — ACETAMINOPHEN 325 MG PO TABS
650.0000 mg | ORAL_TABLET | Freq: Four times a day (QID) | ORAL | Status: DC | PRN
Start: 2023-10-23 — End: 2023-10-26

## 2023-10-23 MED ORDER — EVOLOCUMAB 140 MG/ML ~~LOC~~ SOAJ: 140.0000 mg | SUBCUTANEOUS | Status: DC

## 2023-10-23 MED ORDER — LACTATED RINGERS IV BOLUS (SEPSIS): 500.0000 mL | Freq: Once | INTRAVENOUS | Status: DC

## 2023-10-23 MED ORDER — LACTATED RINGERS IV BOLUS
500.0000 mL | Freq: Once | INTRAVENOUS | Status: AC
  Administered 2023-10-23: 500 mL via INTRAVENOUS

## 2023-10-23 MED ORDER — ASPIRIN 81 MG PO TBEC
81.0000 mg | DELAYED_RELEASE_TABLET | Freq: Every evening | ORAL | Status: DC
  Administered 2023-10-23 – 2023-10-25 (×3): 81 mg via ORAL
  Filled 2023-10-23 (×3): qty 1

## 2023-10-23 MED ORDER — LEVOTHYROXINE SODIUM 75 MCG PO TABS
150.0000 ug | ORAL_TABLET | Freq: Every day | ORAL | Status: DC
  Administered 2023-10-24 – 2023-10-26 (×3): 150 ug via ORAL
  Filled 2023-10-23 (×3): qty 2

## 2023-10-23 MED ORDER — ADULT MULTIVITAMIN W/MINERALS CH
1.0000 | ORAL_TABLET | Freq: Every evening | ORAL | Status: DC
  Administered 2023-10-23 – 2023-10-25 (×3): 1 via ORAL
  Filled 2023-10-23 (×3): qty 1

## 2023-10-23 MED ORDER — SODIUM CHLORIDE 0.9 % IV SOLN
3.0000 g | Freq: Four times a day (QID) | INTRAVENOUS | Status: DC
  Administered 2023-10-23 – 2023-10-26 (×12): 3 g via INTRAVENOUS
  Filled 2023-10-23 (×13): qty 8

## 2023-10-23 MED ORDER — LACTATED RINGERS IV BOLUS (SEPSIS)
1000.0000 mL | Freq: Once | INTRAVENOUS | Status: AC
Start: 2023-10-23 — End: 2023-10-23
  Administered 2023-10-23: 1000 mL via INTRAVENOUS

## 2023-10-23 MED ORDER — INSULIN ASPART 100 UNIT/ML IJ SOLN
0.0000 [IU] | Freq: Three times a day (TID) | INTRAMUSCULAR | Status: DC
  Administered 2023-10-23: 3 [IU] via SUBCUTANEOUS

## 2023-10-23 MED ORDER — TAMSULOSIN HCL 0.4 MG PO CAPS
0.4000 mg | ORAL_CAPSULE | Freq: Every evening | ORAL | Status: DC
  Administered 2023-10-23 – 2023-10-25 (×3): 0.4 mg via ORAL
  Filled 2023-10-23 (×3): qty 1

## 2023-10-23 MED ORDER — LACTATED RINGERS IV BOLUS (SEPSIS)
1000.0000 mL | Freq: Once | INTRAVENOUS | Status: AC
  Administered 2023-10-23: 1000 mL via INTRAVENOUS

## 2023-10-23 MED ORDER — LINEZOLID 600 MG/300ML IV SOLN
600.0000 mg | Freq: Two times a day (BID) | INTRAVENOUS | Status: DC
  Administered 2023-10-23 – 2023-10-25 (×4): 600 mg via INTRAVENOUS
  Filled 2023-10-23 (×5): qty 300

## 2023-10-23 MED ORDER — INSULIN PUMP
Freq: Three times a day (TID) | SUBCUTANEOUS | Status: DC
  Administered 2023-10-26: 5 via SUBCUTANEOUS
  Filled 2023-10-23: qty 1

## 2023-10-23 MED ORDER — SODIUM CHLORIDE 0.9 % IV SOLN
1.0000 g | Freq: Once | INTRAVENOUS | Status: AC
  Administered 2023-10-23: 1 g via INTRAVENOUS
  Filled 2023-10-23: qty 10

## 2023-10-23 MED ORDER — ONDANSETRON HCL 4 MG/2ML IJ SOLN: 4.0000 mg | Freq: Four times a day (QID) | INTRAMUSCULAR | Status: DC | PRN

## 2023-10-23 MED ORDER — ACETAMINOPHEN 650 MG RE SUPP: 650.0000 mg | Freq: Four times a day (QID) | RECTAL | Status: DC | PRN

## 2023-10-23 MED ORDER — ENOXAPARIN SODIUM 40 MG/0.4ML IJ SOSY
40.0000 mg | PREFILLED_SYRINGE | INTRAMUSCULAR | Status: DC
  Filled 2023-10-23: qty 0.4

## 2023-10-23 MED ORDER — LEVOTHYROXINE SODIUM 75 MCG PO TABS
150.0000 ug | ORAL_TABLET | Freq: Every morning | ORAL | Status: DC
  Administered 2023-10-23: 150 ug via ORAL
  Filled 2023-10-23: qty 2

## 2023-10-23 NOTE — ED Notes (Signed)
 Called Lauren at [facility] for transport

## 2023-10-23 NOTE — Assessment & Plan Note (Signed)
+   remote hx/o CVA and TIA  No residual deficits noted

## 2023-10-23 NOTE — Assessment & Plan Note (Signed)
 Baseline Cr 1.2-1.5 w/ GFR 40s-60s  Appears near baseline today  Monitor

## 2023-10-23 NOTE — ED Notes (Signed)
 [facility] to bedside for transfer

## 2023-10-23 NOTE — ED Provider Notes (Signed)
 [place] EMERGENCY DEPARTMENT AT [facility] Provider Note   CSN: [id number] Arrival date & time: 10/22/23  2356     Patient presents with: Cellulitis (RLE)   [name] is a 73 y.o. male.   73 year old male presents for evaluation of fever.  States this started today.  States he has had some chills and rigors as well.  Wife states that he has what looks like possible early cellulitis on his right lower extremity.  Patient states he is not having any leg pain.  He denies any cough, urinary symptoms, or any other symptoms or concerns at this time.        Prior to Admission medications   Medication Sig Start Date End Date Taking? Authorizing Provider  acetaminophen  (TYLENOL ) 500 MG tablet Take 1,000 mg by mouth daily as needed for headache, fever or moderate pain.    [provider]  aspirin  EC 81 MG tablet Take 81 mg by mouth every evening.    [provider]  cetirizine (ZYRTEC) 10 MG tablet Take 10 mg by mouth every evening.    [provider]  clopidogrel  (PLAVIX ) 75 MG tablet Take 1 tablet (75 mg total) by mouth daily at 12 noon. 07/07/22   Barbarann Nest, MD  clotrimazole -betamethasone  (LOTRISONE ) cream Apply 1 Application topically as needed. 03/08/23   [provider]  Evolocumab  (REPATHA  SURECLICK) 140 MG/ML SOAJ INJECT 140 MG INTO THE SKIN EVERY 14 (FOURTEEN) DAYS. 08/22/23   Hilty, Vinie BROCKS, MD  hydrochlorothiazide (HYDRODIURIL) 12.5 MG tablet Take 12.5 mg by mouth daily.    [provider]  insulin  lispro (HUMALOG ) 100 UNIT/ML injection Use 60 units via insulin  pump. Patient taking differently: Inject 100 Units into the skin See admin instructions. Max daily dose 100 units via insulin  pump. Continuous. 08/01/16   Trixie File, MD  levothyroxine  (SYNTHROID ) 150 MCG tablet Take 150 mcg by mouth every morning. 08/19/21   [provider]  loperamide (IMODIUM A-D) 2 MG tablet Take 2 mg by mouth every  evening.    [provider]  losartan (COZAAR) 25 MG tablet Take 25 mg by mouth daily.    [provider]  losartan-hydrochlorothiazide (HYZAAR) 50-12.5 MG tablet Take 1 tablet by mouth every evening. 06/03/22 06/03/23  [provider]  Multiple Vitamin (MULTI-VITAMIN) tablet Take 1 tablet by mouth daily. 11/09/20   [provider]  Multiple Vitamins-Minerals (ONE-A-DAY MENS 50+) TABS Take 1 tablet by mouth every evening.    [provider]  omeprazole  (PRILOSEC  OTC) 20 MG tablet Take 20 mg by mouth every evening.    [provider]  silver sulfADIAZINE (SILVADENE) 1 % cream Apply 1 Application topically as needed. 01/02/23   [provider]  tamsulosin  (FLOMAX ) 0.4 MG CAPS capsule Take 0.4 mg by mouth every evening. 07/07/21   [provider]    Allergies: Crestor  [rosuvastatin  calcium ], Statins, and Tape    Review of Systems  Constitutional:  Positive for chills and fever.  HENT:  Negative for ear pain and sore throat.   Eyes:  Negative for pain and visual disturbance.  Respiratory:  Negative for cough and shortness of breath.   Cardiovascular:  Negative for chest pain and palpitations.  Gastrointestinal:  Negative for abdominal pain and vomiting.  Genitourinary:  Negative for dysuria and hematuria.  Musculoskeletal:  Negative for arthralgias and back pain.  Skin:  Negative for color change and rash.  Neurological:  Negative for seizures and syncope.  All other systems reviewed  and are negative.   Updated Vital Signs BP (!) 100/49   Pulse 93   Temp 100.1 F (37.8 C) (Oral)   Resp 18   SpO2 92%   Physical Exam Vitals and nursing note reviewed.  Constitutional:      General: He is not in acute distress.    Appearance: Normal appearance. He is well-developed. He is not ill-appearing.  HENT:     Head: Normocephalic and atraumatic.  Eyes:     Conjunctiva/sclera: Conjunctivae normal.  Cardiovascular:     Rate  and Rhythm: Regular rhythm. Tachycardia present.     Pulses: Normal pulses.     Heart sounds: Normal heart sounds. No murmur heard. Pulmonary:     Effort: Pulmonary effort is normal. No respiratory distress.     Breath sounds: Normal breath sounds.  Abdominal:     Palpations: Abdomen is soft.     Tenderness: There is no abdominal tenderness.  Musculoskeletal:        General: Swelling present.     Cervical back: Neck supple.     Right lower leg: Edema present.     Comments: Very mild erythema to right lower extremity but there is some swelling and edema  Skin:    General: Skin is warm and dry.     Capillary Refill: Capillary refill takes less than 2 seconds.  Neurological:     Mental Status: He is alert.  Psychiatric:        Mood and Affect: Mood normal.     (all labs ordered are listed, but only abnormal results are displayed) Labs Reviewed  BASIC METABOLIC PANEL WITH GFR - Abnormal; Notable for the following components:      Result Value   Glucose, Bld 129 (*)    BUN 27 (*)    Creatinine, Ser 1.25 (*)    All other components within normal limits  CBC WITH DIFFERENTIAL/PLATELET - Abnormal; Notable for the following components:   WBC 19.1 (*)    RBC 3.89 (*)    Hemoglobin 12.0 (*)    HCT 34.8 (*)    Neutro Abs 17.4 (*)    Lymphs Abs 0.6 (*)    Abs Immature Granulocytes 0.16 (*)    All other components within normal limits  RESP PANEL BY RT-PCR (RSV, FLU A&B, COVID)  [id number]  CULTURE, BLOOD (ROUTINE X 2)  CULTURE, BLOOD (ROUTINE X 2)  CULTURE, BLOOD (SINGLE)  LACTIC ACID, PLASMA  URINALYSIS, ROUTINE W REFLEX MICROSCOPIC  LACTIC ACID, PLASMA    EKG: None  Radiology: DG Tibia/Fibula Right Result Date: 10/23/2023 EXAM: _VIEWS_ VIEW(S) XRAY OF THE RIGHT TIBIA AND FIBULA 10/23/2023 01:17:42 AM COMPARISON: None available. CLINICAL HISTORY: sob. Pt c/o chills, fever, RLE swelling, redness first noticed this evening. Pt advises temp as high as 103.6, hovering between  101-103. FINDINGS: BONES AND JOINTS: Intramedullary rod and screw fixation of the right tibia. Spine screw fixation of the distal tibia. No acute fracture. No evidence of osteomyelitis. SOFT TISSUES: Diffuse edema about the right lower extremity. IMPRESSION: 1. Extensive soft tissue edema of the right lower extremity. Correlate for cellulitis. Electronically signed by: Norman Gatlin MD 10/23/2023 01:22 AM EDT RP Workstation: [id number]   DG Chest 1 View Result Date: 10/23/2023 EXAM: 1 VIEW XRAY OF THE CHEST 10/23/2023 01:17:42 AM COMPARISON: Comparison with 05/24/2022. CLINICAL HISTORY: sob. Pt c/o chills, fever, RLE swelling, redness first noticed this evening. Pt advises temp as high as 103.6, hovering between 101-103. FINDINGS: LUNGS AND PLEURA: Bibasilar atelectasis or  scarring. No pulmonary edema. No pleural effusion. No pneumothorax. HEART AND MEDIASTINUM: Stable cardiomediastinal silhouette. Sternotomy and CABG. AVR. BONES AND SOFT TISSUES: No acute osseous abnormality. IMPRESSION: 1. No acute cardiopulmonary process. Electronically signed by: Norman Gatlin MD 10/23/2023 01:20 AM EDT RP Workstation: [id number]     Procedures   Medications Ordered in the ED  lactated ringers  bolus 1,000 mL (1,000 mLs Intravenous New Bag/Given 10/23/23 0323)    And  lactated ringers  bolus 1,000 mL (has no administration in time range)    And  lactated ringers  bolus 500 mL (has no administration in time range)  lactated ringers  bolus 500 mL (500 mLs Intravenous New Bag/Given 10/23/23 0126)  acetaminophen  (TYLENOL ) tablet 650 mg (650 mg Oral Given 10/23/23 0118)  cefTRIAXone  (ROCEPHIN ) 1 g in sodium chloride  0.9 % 100 mL IVPB (0 g Intravenous Stopped 10/23/23 0245)                                    Medical Decision Making Cardiac monitor interpretation: Sinus rhythm and sinus tachycardia, no ectopy  Patient presents for fever and chills.  He does have possible cellulitis on his right lower  extremity and was found to have a leukocytosis of 19.  Initially somewhat tachycardic but blood pressure was in the low 110s.  Was given Tylenol  but his feels like his fevers returned.  He initially wanted to be discharged home so given Rocephin  through the IV and a small fluid bolus.  States he is overall not feeling better and he and his family have had a discussion he would like to stay in the hospital.  He does seem to have evidence of possible early sepsis I will treat him as such.  Given 30 cc/kg fluid bolus, however it will be run slow as he has a history of CHF.  Vitals are fairly stable however although blood pressure did drop to the 90s.  He was already treated with broad-spectrum antibiotics.  Lactate is within normal levels and blood cultures were drawn.  Discussed with Dr. Shona, hospitalist and patient will be admitted for further workup and management.  Patient and family at bedside are agreeable with the plan.   Problems Addressed: Cellulitis of right lower extremity: acute illness or injury Sepsis, due to unspecified organism, unspecified whether acute organ dysfunction present [facility]): acute illness or injury  Amount and/or Complexity of Data Reviewed External Data Reviewed: notes.    Details: Recent echocardiogram from 10-8 reviewed and patient has a EF of 60% Labs: ordered. Decision-making details documented in ED Course.    Details: Ordered and reviewed by me patient has a leukocytosis but labs fairly unremarkable otherwise Radiology: ordered and independent interpretation performed. Decision-making details documented in ED Course.    Details: Ordered and interpreted by me independently of radiology Chest x-ray: Shows no acute abnormality Right lower extremity tib-fib x-ray: Shows evidence of soft tissue edema Discussion of management or test interpretation with external provider(s): Dr.  Shona -hospitalist-I spoke with her on the phone regarding the patient's case and we will admit  him for sepsis and further workup and management.  Risk OTC drugs. Prescription drug management. Drug therapy requiring intensive monitoring for toxicity. Decision regarding hospitalization. Risk Details: CRITICAL CARE Performed by: Duwaine LITTIE Fusi   Total critical care time: 30 minutes  Critical care time was exclusive of separately billable procedures and treating other patients.  Critical care was necessary to treat  or prevent imminent or life-threatening deterioration.  Critical care was time spent personally by me on the following activities: development of treatment plan with patient and/or surrogate as well as nursing, discussions with consultants, evaluation of patient's response to treatment, examination of patient, obtaining history from patient or surrogate, ordering and performing treatments and interventions, ordering and review of laboratory studies, ordering and review of radiographic studies, pulse oximetry and re-evaluation of patient's condition.   Critical Care Total time providing critical care: 30 minutes     Final diagnoses:  Sepsis, due to unspecified organism, unspecified whether acute organ dysfunction present [facility])  Cellulitis of right lower extremity    ED Discharge Orders     None          Gennaro Duwaine CROME, DO 10/23/23 [phone]

## 2023-10-23 NOTE — Progress Notes (Signed)
 Pt being followed by [facility] for Sepsis protocol.

## 2023-10-23 NOTE — Assessment & Plan Note (Signed)
 BP stable  Ttitrate home regimen

## 2023-10-23 NOTE — Progress Notes (Signed)
 As noted by [facility] staff following for Sepsis protocol, it appears the 1st set of blood culture drawn at 0120 were drawn prior to Abx given. 2nd set was drawn @ 0324 after Abx were given.

## 2023-10-23 NOTE — Assessment & Plan Note (Addendum)
 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

## 2023-10-23 NOTE — Assessment & Plan Note (Signed)
 Continue synthroid.

## 2023-10-23 NOTE — ED Notes (Signed)
 Pt stated he has a continuous glucose monitor (blood sugar was 150), stated he didn't think he needed a CBG.

## 2023-10-23 NOTE — Sepsis Progress Note (Signed)
 Fluid requirements not met for sepsis protocol  due to concern for fluid overload.

## 2023-10-23 NOTE — Progress Notes (Signed)
 VASCULAR LAB    Right lower extremity venous duplex has been performed.  See CV proc for preliminary results.   Kingslee Mairena, RVT 10/23/2023, 7:02 PM

## 2023-10-23 NOTE — Assessment & Plan Note (Signed)
 CPAP.

## 2023-10-23 NOTE — H&P (Signed)
 History and Physical    Patient: [name] FMW:[id number] DOB: 02-21-1950 DOA: 10/23/2023 DOS: the patient was seen and examined on 10/23/2023 PCP: Darilyn Rosalva Bruckner, PA-C  Patient coming from: Home  Chief Complaint:  Chief Complaint  Patient presents with   Cellulitis    RLE   HPI: [name] is a 73 y.o. 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  Patient reports abrupt onset of right lower extremity redness and swelling over the past 24 to 48 hours.  Patient denies any prior episodes like this in the past.  Has chronic lower extremity swelling and mild venous stasis changes.  No known trauma to the right lower extremity though there is a noted healing wound on the right anterior shin.  Baseline type 1 diabetes with insulin  pump in place.  Sugars have been fairly brittle chronically.  Usually A1c is around 67 though blood sugar ranges been 100s to 200s.  No reported alcohol or tobacco use.  No reported recent medication changes.  Has had worsening right lower extremity redness and swelling.  No purulent drainage.  Also with mild ulceration of the right heel.  No chest pain or shortness of breath.  No abdominal pain or diarrhea. Presented to the ER Tmax 100.1, heart rate 100s, BP relatively stable.  White count 19.1, hemoglobin 12, platelets 206, creatinine 1.25.  Glucose 129.  Lactate within normal limits.  Chest x-ray within normal limits.  Right tib-fib with extensive soft tissue swelling concerning for cellulitis. Review of Systems: As mentioned in the history of present illness. All other systems reviewed and are negative. Past Medical  History:  Diagnosis Date   Aortic stenosis, moderate Sept/Oct /2013   2017-> s/p bioprosthetic AV [facility] Ease bovine pericardial valve model 3300 TFX, ser # [id number]).   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 Inferolateral perfusion defect + EKG abnormality during stress testing prompted Cath by [facility]: 3V CAD, EF normal.  Med mgmt recommended; b. 06/2015 Cath: 3VD and mod AS; c. 07/2015 CABG x 3 (LIMA->LAD, VG->OM2, VG->PDA) w/ AVR.   Carotid stenosis, right 09/2011   Dr. Court did R carotid stenting 01/2014.  Carotid dopplers 06/2015 essentially normal.  No change 07/2016--repeat 1 yr.   Complication of anesthesia    difficulty with intubation,    Decreased pedal pulses    LE doppler 11/08/11- no evidence of arterial insufficiency   Diabetes mellitus Dx'd age 70   Novolog  via insulin  pump; sub-optimal control x years   Diabetic neuropathy (HCC)    fine touch and position sense affected   Diabetic retinopathy    Proliferative: Hx of retinal detachment on right-light perception only in right eye   Diastolic dysfunction 2007; 2016   TEE with diastolic dysfunction, EF 74%.   Difficult intubation    ~ 2002 difficult fiberoptic intubation with takeback bleeding s/p thyroidectomy;  for thyroidectomy was intubated DL X 1 with cricoid pressure but difficult mask (full beard)   Erectile dysfunction    [place]   GERD (gastroesophageal reflux disease)    Heart murmur    History of tobacco abuse    Quit about 1990 (has 35 pack-yr hx)   Hyperlipemia, mixed  a. intolerant of statins/zeta; per Dr. Court 12/2016, pt started on PCSK9--incomplete responder?---01/2017.   Hypothyroidism, postsurgical    Thyroidectomy 2002; multinodular goiter.  Dr. Trixie managing this as of 10/2015.   Impingement syndrome of right shoulder    08/26/15 Pt got subacromial steroid injection by orthopedist (Dr. Norleen Centers).  [facility] f/u 01/2016--MRI showed RC tear + AC joint arthritis, arthroscopic  surgery done 03/2016 ([facility]).   OSA (obstructive sleep apnea)    06/2011 sleep study: moderate OSA, CPAP at 12 cm H2O.   Osteoarthritis    Bilat thumb carpometacarpal joints.  Ortho injected each thumb x 2 in 2017.  Also injected 03/14/17.   Recurrent pneumonia    TIA (transient ischemic attack) 05/2015   Left brain (right sided numbness + slurred speech)  Admitted for obs/workup 05/2015.   Venous reflux 10/14/2011   venous doppler-R GSV continuous reflux throughout; too small for VNUS closure   Past Surgical History:  Procedure Laterality Date   ABDOMINAL AORTOGRAM W/LOWER EXTREMITY N/A 10/27/2020   Procedure: ABDOMINAL AORTOGRAM W/LOWER EXTREMITY;  Surgeon: Lanis Fonda BRAVO, MD;  Location: [facility] INVASIVE CV LAB;  Service: Vascular;  Laterality: N/A;   ABI  01/2016   Normal.  Dr. Court to repeat in 1 yr.   AORTIC VALVE REPLACEMENT N/A 07/09/2015   Bovine pericardial valve.  Procedure: AORTIC VALVE REPLACEMENT (AVR);  Surgeon: Elspeth JAYSON Millers, MD;  Location: [facility] OR;  Service: Open Heart Surgery;  Laterality: N/A;   CARDIAC CATHETERIZATION  10/2011   EF normal.  Diffuse 3 vessel CAD, no stents placed.  Medical mgmt per [facility].   CARDIAC CATHETERIZATION N/A 06/22/2015   Procedure: Right/Left Heart Cath and Coronary Angiography;  Surgeon: Dorn JINNY Court, MD;  Location: [facility] INVASIVE CV LAB;  Service: Cardiovascular;  Laterality: N/A;   carotid dopplers  06/30/15   Normal: repeat when clinically indicated per Dr. Court   CAROTID STENT INSERTION Right 01/16/2014   Procedure: CAROTID STENT INSERTION;  Surgeon: Dorn JINNY Court, MD;  Location: [facility] CATH LAB;  Service: Cardiovascular;  Laterality: Right;   CATARACT EXTRACTION     left   CORONARY ARTERY BYPASS GRAFT N/A 07/09/2015   Procedure: CORONARY ARTERY BYPASS GRAFTING (CABG)TIMES THREE USING LEFT INTERNAL MAMMARY ARTERY AND LEFT SAPHENOUS LEG VEIN HARVESTED ENDOSCOPICALLY;  Surgeon: Elspeth JAYSON Millers, MD;  Location: [facility] OR;  Service: Open  Heart Surgery;  Laterality: N/A;   EYE SURGERY     Multiple laser surgeries for diabetic retinopathy, also cataract surgery OU.   HARDWARE REMOVAL Right 05/28/2020   Procedure: HARDWARE REMOVAL AND EXCHANGE RIGHT ANKLE;  Surgeon: Elsa Lonni SAUNDERS, MD;  Location: [facility] OR;  Service: Orthopedics;  Laterality: Right;   INTRAOPERATIVE TRANSESOPHAGEAL ECHOCARDIOGRAM N/A 07/09/2015   Procedure: INTRAOPERATIVE TRANSESOPHAGEAL ECHOCARDIOGRAM;  Surgeon: Elspeth JAYSON Millers, MD;  Location: [facility] OR;  Service: Open Heart Surgery;  Laterality: N/A;   lasix  eye     LEFT AND RIGHT HEART CATHETERIZATION WITH CORONARY ANGIOGRAM N/A 10/28/2011   Procedure: LEFT AND R0IHT HEART CATHETERIZATION WITH CORONARY ANGIOGRAM;  Surgeon: Debby DELENA Sor, MD;  Location: [facility] CATH LAB;  Service: Cardiovascular;  Laterality: N/A;   ORIF ANKLE FRACTURE Right 07/23/2019   Procedure: OPEN REDUCTION RIGHT ANKLE DISLOCATION WITH REPAIR, OPEN TREATMENT OF TIBIAL PLAFOND FRACTURE WITH LATERAL MALLEOLUS, OPEN TREATMENT OF SYNDESMOSIS;  Surgeon: Elsa Lonni SAUNDERS, MD;  Location: [facility] OR;  Service: Orthopedics;  Laterality: Right;  LENGTH OF SURGERY: 2.5 HOURS   RETINAL DETACHMENT SURGERY     Right  eye   ROTATOR CUFF REPAIR W/ DISTAL CLAVICLE EXCISION Right 03/2016   Arthroscopic ([facility])   THYROID  SURGERY  2002   TIBIA IM NAIL INSERTION Right 05/28/2020   Procedure: INTRAMEDULLARY NAIL RIGHT TIBIA;  Surgeon: Elsa Lonni SAUNDERS, MD;  Location: [facility] OR;  Service: Orthopedics;  Laterality: Right;   TRANSTHORACIC ECHOCARDIOGRAM  10/2011;12/2012;01/2015; 08/2015; 07/28/16   Mod AS, mild LVH, EF 60-65%, no RWMA.  01/2015 EF 60-65%, grade I DD, progression of mod/sev AS.  08/2015 normal lv fxn with normal fxning bioprosthetic Ao valve.  07/2016--no change compared to 2017 echo.     Social History:  reports that he quit smoking about 42 years ago. His smoking use included cigarettes. He has never used smokeless tobacco. He reports that he does not  currently use alcohol. He reports that he does not use drugs.  Allergies  Allergen Reactions   Crestor  [Rosuvastatin  Calcium ] Other (See Comments)    Myalgias Lethargy   Statins Other (See Comments)    Problem with higher dosages (Lipitor caused memory issues) Myalgias Lethargy   Lipitor [Atorvastatin ] Other (See Comments)    Myalgais  Lethargy  Memory issues, brain fog   Tape Rash and Other (See Comments)    Adhesive tape.  (Paper tape OK)    Family History  Problem Relation Age of Onset   Cancer Mother        lung cancer   Alcohol abuse Father    Cancer Father        laryngeal cancer   Cancer Brother        oldest brother had lung cancer and melanoma    Prior to Admission medications   Medication Sig Start Date End Date Taking? Authorizing Provider  acetaminophen  (TYLENOL ) 500 MG tablet Take 1,000 mg by mouth 2 (two) times daily as needed for headache, fever or moderate pain (pain score 4-6).   Yes [provider]  aspirin  EC 81 MG tablet Take 81 mg by mouth every evening.   Yes [provider]  cetirizine (ZYRTEC) 10 MG tablet Take 10 mg by mouth every evening.   Yes [provider]  clopidogrel  (PLAVIX ) 75 MG tablet Take 1 tablet (75 mg total) by mouth daily at 12 noon. 07/07/22  Yes Barbarann Nest, MD  clotrimazole -betamethasone  (LOTRISONE ) cream Apply 1 Application topically 2 (two) times daily as needed (skin irritation). 03/08/23  Yes [provider]  Evolocumab  (REPATHA  SURECLICK) 140 MG/ML SOAJ INJECT 140 MG INTO THE SKIN EVERY 14 (FOURTEEN) DAYS. 08/22/23  Yes Hilty, Vinie BROCKS, MD  hydrochlorothiazide (HYDRODIURIL) 12.5 MG tablet Take 12.5 mg by mouth daily.   Yes [provider]  insulin  lispro (HUMALOG ) 100 UNIT/ML injection Use 60 units via insulin  pump. Patient taking differently: Inject 100 Units into the skin See admin instructions. Max daily dose 100 units via insulin  pump. Continuous. 08/01/16  Yes Trixie File,  MD  levothyroxine  (SYNTHROID ) 150 MCG tablet Take 150 mcg by mouth daily before breakfast. 08/19/21  Yes [provider]  loperamide (IMODIUM A-D) 2 MG tablet Take 2 mg by mouth daily as needed for diarrhea or loose stools.   Yes [provider]  losartan (COZAAR) 25 MG tablet Take 25 mg by mouth daily.   Yes [provider]  Multiple Vitamins-Minerals (ONE-A-DAY MENS 50+) TABS Take 1 tablet by mouth every evening.   Yes [provider]  omeprazole  (PRILOSEC  OTC) 20 MG tablet Take 20 mg by mouth every evening.   Yes [provider]  tamsulosin  (FLOMAX ) 0.4 MG CAPS capsule Take 0.4 mg by mouth every evening. 07/07/21  Yes [provider]    Physical Exam: Vitals:   10/23/23 1130 10/23/23 1230 10/23/23 1242 10/23/23 1330  BP: (!) 116/55 (!) 112/39  (!) 124/55  Pulse: 86 89  80  Resp: 19 20  17   Temp:   98.2 F (36.8 C) 97.6 F (36.4 C)  TempSrc:    Oral  SpO2: 98% 97%  99%   Physical Exam Constitutional:      Appearance: He is obese.  HENT:     Head: Normocephalic and atraumatic.     Nose: Nose normal.     Mouth/Throat:     Mouth: Mucous membranes are moist.  Eyes:     Pupils: Pupils are equal, round, and reactive to light.  Cardiovascular:     Rate and Rhythm: Normal rate and regular rhythm.  Pulmonary:     Effort: Pulmonary effort is normal.  Abdominal:     General: Bowel sounds are normal.  Musculoskeletal:        General: Normal range of motion.  Skin:    Comments: + RLE redness and swelling  + Mild R heel ulceration  Neurological:     General: No focal deficit present.  Psychiatric:        Mood and Affect: Mood normal.       Data Reviewed:  There are no new results to review at this time.  DG Tibia/Fibula Right EXAM: _VIEWS_ VIEW(S) XRAY OF THE RIGHT TIBIA AND FIBULA 10/23/2023 01:17:42 AM  COMPARISON: None available.  CLINICAL HISTORY: sob. Pt c/o chills, fever, RLE swelling, redness first noticed  this evening. Pt advises temp as high as 103.6, hovering between 101-103.  FINDINGS:  BONES AND JOINTS: Intramedullary rod and screw fixation of the right tibia. Spine screw fixation of the distal tibia. No acute fracture. No evidence of osteomyelitis.  SOFT TISSUES: Diffuse edema about the right lower extremity.  IMPRESSION: 1. Extensive soft tissue edema of the right lower extremity. Correlate for cellulitis.  Electronically signed by: Norman Gatlin MD 10/23/2023 01:22 AM EDT RP Workstation: [id number] DG Chest 1 View EXAM: 1 VIEW XRAY OF THE CHEST 10/23/2023 01:17:42 AM  COMPARISON: Comparison with 05/24/2022.  CLINICAL HISTORY: sob. Pt c/o chills, fever, RLE swelling, redness first noticed this evening. Pt advises temp as high as 103.6, hovering between 101-103.  FINDINGS:  LUNGS AND PLEURA: Bibasilar atelectasis or scarring. No pulmonary edema. No pleural effusion. No pneumothorax.  HEART AND MEDIASTINUM: Stable cardiomediastinal silhouette. Sternotomy and CABG. AVR.  BONES AND SOFT TISSUES: No acute osseous abnormality.  IMPRESSION: 1. No acute cardiopulmonary process.  Electronically signed by: Norman Gatlin MD 10/23/2023 01:20 AM EDT RP Workstation: [id number]  Lab Results  Component Value Date   WBC 19.1 (H) 10/23/2023   HGB 12.0 (L) 10/23/2023   HCT 34.8 (L) 10/23/2023   MCV 89.5 10/23/2023   PLT 206 10/23/2023   Last metabolic panel Lab Results  Component Value Date   GLUCOSE 129 (H) 10/23/2023   NA 139 10/23/2023   K 4.6 10/23/2023   CL 103 10/23/2023   CO2 25 10/23/2023   BUN 27 (H) 10/23/2023   CREATININE 1.25 (H) 10/23/2023   GFRNONAA >60 10/23/2023   CALCIUM  8.9 10/23/2023   PHOS 3.3 04/18/2018   PROT 6.9 01/02/2017   ALBUMIN  2.7 (L) 04/18/2018   BILITOT 0.5 01/02/2017   ALKPHOS 98 01/02/2017   AST 24 01/02/2017   ALT 21 01/02/2017  ANIONGAP 10 10/23/2023    Assessment and Plan: * Cellulitis RLE redness and  swelling in setting of baseline brittle type 1 DM  Noted old wound on R anterior shin as possible nidus  RLE plain films indicative of cellulitis  Will place on unasyn and zyvox for infectious coverage  Blood cultures  RLE u/s to r/o DVT x 1 R foot plain films x 1  Pain control  Follow closely    Stage 3a chronic kidney disease (CKD) (HCC) Baseline Cr 1.2-1.5 w/ GFR 40s-60s  Appears near baseline today  Monitor    Stroke [facility]) + remote hx/o CVA and TIA  No residual deficits noted    Essential hypertension BP stable  Ttitrate home regimen    OSA (obstructive sleep apnea) CPAP    Poorly controlled type 1 diabetes mellitus with circulatory disorder (HCC) Blood sugars 120s today  Insulin  pump in place  SSI  A1C Monitor    CAD (coronary artery disease) Baseline hx/o CAD s/p CABG x 3 07/2015  No active chest pain at present  Cont home regimen including asa, repatha    Aortic stenosis, moderate S/p bioprosthetic AVR (2017)   Hypothyroidism Continue synthroid         Advance Care Planning:   Code Status: Full Code   Consults: None   Family Communication: Wife at the bedside   Severity of Illness: The appropriate patient status for this patient is OBSERVATION. Observation status is judged to be reasonable and necessary in order to provide the required intensity of service to ensure the patient's safety. The patient's presenting symptoms, physical exam findings, and initial radiographic and laboratory data in the context of their medical condition is felt to place them at decreased risk for further clinical deterioration. Furthermore, it is anticipated that the patient will be medically stable for discharge from the hospital within 2 midnights of admission.   Author: Elspeth JINNY Masters, MD 10/23/2023 3:27 PM  For on call review [email or web address].

## 2023-10-23 NOTE — TOC Initial Note (Addendum)
 Transition of Care [facility]) - Initial/Assessment Note    Patient Details  Name: [name] MRN: [id number] Date of Birth: 1950-08-30  Transition of Care [facility]) CM/SW Contact:    Bascom Service, RN Phone Number: 10/23/2023, 3:48 PM  Clinical Narrative: d/c plan home-spoke to Sandra(spouse). Has health insurance for med asst.Has own transport home.                  Expected Discharge Plan: Home/Self Care Barriers to Discharge: Continued Medical Work up   Patient Goals and CMS Choice Patient states their goals for this hospitalization and ongoing recovery are:: Home CMS Medicare.gov Compare Post Acute Care list provided to:: Patient Represenative (must comment) (Sandra(spouse)) Choice offered to / list presented to : Spouse  ownership interest in [facility] [place].provided to:: Spouse    Expected Discharge Plan and Services   Discharge Planning Services: CM Consult Post Acute Care Choice: Resumption of Svcs/PTA Provider                                        Prior Living Arrangements/Services   Lives with:: Spouse                   Activities of Daily Living   ADL Screening (condition at time of admission) Independently performs ADLs?: Yes (appropriate for developmental age) Is the patient deaf or have difficulty hearing?: No Does the patient have difficulty seeing, even when wearing glasses/contacts?: Yes Does the patient have difficulty concentrating, remembering, or making decisions?: No  Permission Sought/Granted                  Emotional Assessment              Admission diagnosis:  Cellulitis [L03.90] Cellulitis of right lower extremity [L03.115] Sepsis, due to unspecified organism, unspecified whether acute organ dysfunction present [facility]) [A41.9] Patient Active Problem List   Diagnosis Date Noted   Cellulitis 10/23/2023   Diabetic infection of right foot (HCC) 07/07/2022   Acute mycotic otitis externa 08/05/2020    Normocytic anemia 11/21/2018   Rhegmatogenous retinal detachment of right eye 10/12/2018   Stage 3a chronic kidney disease (CKD) (HCC) 06/19/2018   Laceration of elbow 04/30/2018   Injury, superficial, elbow, forearm, or wrist with infection 04/16/2018   Superficial injury of elbow, forearm and wrist 04/16/2018   Colonoscopy refused 06/13/2017   Postoperative hypothyroidism 04/12/2017   Statin intolerance 01/18/2017   S/P CABG x 3 07/14/2015   S/P AVR 07/09/2015   Proliferative diabetic retinopathy of left eye without macular edema associated with type 1 diabetes mellitus (HCC) 05/15/2015   Numbness and tingling of right arm and leg 05/06/2015   Polypharmacy 04/09/2014   Carotid occlusion, right 01/16/2014   Carotid stenosis    Stroke [facility])    TIA (transient ischemic attack) 01/12/2014   Pre-syncope 05/13/2013   Essential hypertension 03/18/2013   Trochanteric bursitis of left hip 01/18/2013   Preventative health care 01/18/2013   OSA (obstructive sleep apnea)    ED (erectile dysfunction) of organic origin 07/11/2012   Hypertrophy of prostate without urinary obstruction and other lower urinary tract symptoms (LUTS) 07/11/2012   Psychosexual dysfunction with inhibited sexual excitement 07/11/2012   Mild cognitive impairment 05/09/2012   Poorly controlled type 1 diabetes mellitus with circulatory disorder (HCC) 05/09/2012   CAD (coronary artery disease) 01/11/2012   Hx of vitrectomy 12/20/2011  Abnormal nuclear cardiac imaging test, 10/2011 10/28/2011   PAD (peripheral artery disease) 09/28/2011   Disequilibrium syndrome 09/28/2011   Aortic stenosis, moderate 09/28/2011   Carotid stenosis, right 09/28/2011   Colon cancer screening 04/21/2011   Fatigue 04/21/2011   Hyperlipidemia 03/21/2011   Diabetic peripheral neuropathy (HCC) 01/19/2011   Restless legs syndrome 01/19/2011   Hypothyroidism 01/13/2011   Presence of intraocular lens 12/13/2010   PCP:  Darilyn Rosalva Bruckner, PA-C Pharmacy:   [facility]) - [place], [place] - [address] [facility] [address] [place] [address] [place] [address] Phone: [phone] Fax: [phone]  [facility] [phone] - [place] - [address] [place] [address] [place] [address] [place] [address] Phone: [phone] Fax: [phone]     Social Drivers of Health (SDOH) Social History: SDOH Screenings   Food Insecurity: No Food Insecurity (10/23/2023)  Housing: Low Risk  (10/23/2023)  Transportation Needs: No Transportation Needs (10/23/2023)  Utilities: Not At Risk (10/23/2023)  Social Connections: Moderately Isolated (10/23/2023)  Tobacco Use: Medium Risk (10/23/2023)   SDOH Interventions:     Readmission Risk Interventions     No data to display

## 2023-10-23 NOTE — Assessment & Plan Note (Signed)
 Baseline hx/o CAD s/p CABG x 3 07/2015  No active chest pain at present  Cont home regimen including asa, repatha 

## 2023-10-23 NOTE — Assessment & Plan Note (Signed)
 Blood sugars 120s today  Insulin  pump in place  SSI  A1C Monitor

## 2023-10-23 NOTE — ED Notes (Addendum)
 Pt's wife requested for his blood sugar to be checked.

## 2023-10-23 NOTE — Assessment & Plan Note (Signed)
 S/p bioprosthetic AVR (2017)

## 2023-10-24 DIAGNOSIS — L03115 Cellulitis of right lower limb: Secondary | ICD-10-CM | POA: Diagnosis not present

## 2023-10-24 LAB — CBC
HCT: 32.2 % — ABNORMAL LOW (ref 39.0–52.0)
Hemoglobin: 10.6 g/dL — ABNORMAL LOW (ref 13.0–17.0)
MCH: 30.9 pg (ref 26.0–34.0)
MCHC: 32.9 g/dL (ref 30.0–36.0)
MCV: 93.9 fL (ref 80.0–100.0)
Platelets: 166 K/uL (ref 150–400)
RBC: 3.43 MIL/uL — ABNORMAL LOW (ref 4.22–5.81)
RDW: 12.7 % (ref 11.5–15.5)
WBC: 9.5 K/uL (ref 4.0–10.5)
nRBC: 0 % (ref 0.0–0.2)

## 2023-10-24 LAB — COMPREHENSIVE METABOLIC PANEL WITH GFR
ALT: 17 U/L (ref 0–44)
AST: 32 U/L (ref 15–41)
Albumin: 3.3 g/dL — ABNORMAL LOW (ref 3.5–5.0)
Alkaline Phosphatase: 90 U/L (ref 38–126)
Anion gap: 9 (ref 5–15)
BUN: 26 mg/dL — ABNORMAL HIGH (ref 8–23)
CO2: 25 mmol/L (ref 22–32)
Calcium: 8.2 mg/dL — ABNORMAL LOW (ref 8.9–10.3)
Chloride: 104 mmol/L (ref 98–111)
Creatinine, Ser: 1.12 mg/dL (ref 0.61–1.24)
GFR, Estimated: >60 mL/min (ref >60)
Glucose, Bld: 131 mg/dL — ABNORMAL HIGH (ref 70–99)
Potassium: 4 mmol/L (ref 3.5–5.1)
Sodium: 138 mmol/L (ref 135–145)
Total Bilirubin: 0.6 mg/dL (ref 0.0–1.2)
Total Protein: 5.7 g/dL — ABNORMAL LOW (ref 6.5–8.1)

## 2023-10-24 LAB — GLUCOSE, CAPILLARY: Glucose-Capillary: 144 mg/dL — ABNORMAL HIGH (ref 70–99)

## 2023-10-24 MED ORDER — COLLAGENASE 250 UNIT/GM EX OINT
TOPICAL_OINTMENT | Freq: Every day | CUTANEOUS | Status: DC
  Administered 2023-10-26: 1 via TOPICAL
  Filled 2023-10-24: qty 30

## 2023-10-24 MED ORDER — ZINC SULFATE 220 (50 ZN) MG PO CAPS
220.0000 mg | ORAL_CAPSULE | Freq: Every day | ORAL | Status: DC
  Administered 2023-10-24 – 2023-10-26 (×3): 220 mg via ORAL
  Filled 2023-10-24 (×3): qty 1

## 2023-10-24 MED ORDER — VITAMIN C 500 MG PO TABS
500.0000 mg | ORAL_TABLET | Freq: Two times a day (BID) | ORAL | Status: DC
Start: 2023-10-24 — End: 2023-10-26
  Administered 2023-10-24 – 2023-10-26 (×5): 500 mg via ORAL
  Filled 2023-10-24 (×5): qty 1

## 2023-10-24 MED ORDER — ENSURE MAX PROTEIN PO LIQD
11.0000 [oz_av] | Freq: Two times a day (BID) | ORAL | Status: DC
  Administered 2023-10-24: 11 [oz_av] via ORAL
  Filled 2023-10-24 (×5): qty 330

## 2023-10-24 NOTE — Evaluation (Signed)
 Occupational Therapy Evaluation Patient Details Name: [name] MRN: [id number] DOB: Mar 03, 1950 Today's Date: 10/24/2023   History of Present Illness   Patient is a 73 year old male who presented to the ED with c/o swelling and redness in RLE.  Dx with cellulitis.  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  Patient reported completing many ADLs while seated due to balance concerns due to BLE of unequal length secondary to right ankle Fx & ORIF.  Patient demonstrated dressing tasks ModI while seated during evaluation and demonstrated standing activity tolerance of 2-3 minutes while marching in place using [facility] for stability, which patient uses at baseline.  Patient demonstrated self-care and functional mobility grossly at baseline.  Patient declined home health OT follow-up and the home has all relevant environmental mods to support independence.  Patient evaluated by Occupational Therapy with no further acute OT needs identified. All education has been completed and the patient has no further questions. OT to sign off. Thank you for referral.       Functional Status Assessment   Patient has not had a recent decline in their functional status     Equipment Recommendations   None recommended by OT      Precautions/Restrictions   Precautions Precautions: Fall Recall of Precautions/Restrictions: Intact Precaution/Restrictions Comments: Patient reports feeling more secure about ambulation when wearing adaptive shoes and using SPC. Required Braces or Orthoses: Other Brace (Built-up right shoe to compensate for  unequal length of BLE) Restrictions Weight Bearing Restrictions Per Provider Order: No     Mobility Bed Mobility Overal bed mobility: Modified Independent    Transfers Overall transfer level: Modified independent Equipment used: Straight cane      Balance Overall balance assessment: Mild deficits observed, not formally tested Sitting-balance support: No upper extremity supported, Feet supported Sitting balance-Leahy Scale: Good   Standing balance-Leahy Scale: Fair     ADL either performed or assessed with clinical judgement   ADL Overall ADL's : At baseline Eating/Feeding: Independent   Grooming: Modified independent   Upper Body Bathing: Modified independent;Sitting   Lower Body Bathing: Modified independent;Sitting/lateral leans   Upper Body Dressing : Modified independent;Sitting   Lower Body Dressing: Modified independent;Sitting/lateral leans   Toilet Transfer: Comfort height toilet;Grab bars;Anterior/posterior;Modified Community education officer Details (indicate cue type and reason): SPC Toileting- Clothing Manipulation and Hygiene: Sit to/from stand;Modified independent   Functional mobility during ADLs: Modified independent;Cane       Vision Baseline Vision/History: 1 Wears glasses;5 Retinopathy Ability to See in Adequate Light: 0 Adequate Patient Visual Report: No change from baseline Vision Assessment?: No apparent visual deficits Additional Comments: Wears bifocals            Pertinent Vitals/Pain Pain Assessment Pain Assessment: No/denies pain Pain Intervention(s): Monitored during session     Extremity/Trunk Assessment Upper Extremity Assessment Upper Extremity Assessment: Overall WFL for tasks assessed   Lower Extremity Assessment Lower Extremity Assessment: Defer to PT evaluation   Cervical / Trunk Assessment Cervical / Trunk Assessment: Normal   Communication Communication Communication: No apparent difficulties   Cognition  Arousal: Alert Behavior During Therapy: WFL for tasks assessed/performed Cognition: No apparent impairments  OT - Cognition Comments: Patient able to provide detailed information about prior / chronic conditions & procedures.   Following commands: Intact        General Comments     Patient observed with small wounds on BLE; redness and swelling persistent on RLE.           Home Living Family/patient expects to be discharged to:: Private residence Living Arrangements: Spouse/significant other Available Help at Discharge: Family;Available 24 hours/day Type of Home: House Home Access: Level entry    Home Layout: Two level;Bed/bath upstairs Alternate Level Stairs-Number of Steps: Flight of stairs; Consulting civil engineer Shower/Tub: Producer, television/film/video: Handicapped height Bathroom Accessibility: Yes How Accessible: Accessible via wheelchair Home Equipment: Agricultural consultant (2 wheels);Cane - single point;Wheelchair - manual;Grab bars - tub/shower;Grab bars - toilet      Prior Functioning/Environment Prior Level of Function : Independent/Modified Independent   ADLs Comments: Reports no assistance required at PLOF.    OT Problem List: Decreased activity tolerance;Impaired balance (sitting and/or standing);Increased edema        OT Goals(Current goals can be found in the care plan section)       OT Frequency:  One visit; evaluation only       AM-PAC OT 6 Clicks Daily Activity     Outcome Measure Help from another person eating meals?: None Help from another person taking care of personal grooming?: None Help from another person toileting, which includes using toliet, bedpan, or urinal?: None Help from another person bathing (including washing, rinsing, drying)?: None Help from another person to put on and taking off regular upper body clothing?: None Help from another person to put on and taking off regular lower body clothing?: None 6 Click Score:  24   End of Session Equipment Utilized During Treatment: Gait belt;Other (comment) (SPC; built up adaptive shoes) Nurse Communication: Mobility status  Activity Tolerance: Patient tolerated treatment well Patient left: in bed;with call bell/phone within reach;Other (comment) (Patient seated EOB in conversation with MD)  OT Visit Diagnosis: Unsteadiness on feet (R26.81)                Time: 8993-8968 OT Time Calculation (min): 25 min Charges:  OT General Charges $OT Visit: 1 Visit OT Evaluation $OT Eval Low Complexity: 1 Low OT Treatments $Therapeutic Activity: 8-22 mins  Angeliki Mates B. Breeze Berringer, MS, OTR/L 10/24/2023, 2:33 PM

## 2023-10-24 NOTE — Progress Notes (Signed)
 Initial Nutrition Assessment  INTERVENTION:   -Ensure MAX Protein po BID, each supplement provides 150 kcal and 30 grams of protein   For wound healing: -Multivitamin with minerals daily -500 mg Vitamin C BID -220 mg Zinc sulfate daily x 14 days  -Liberalize diet to just CHO modified to help promote good PO intakes.  NUTRITION DIAGNOSIS:   Increased nutrient needs related to wound healing as evidenced by estimated needs.  GOAL:   Patient will meet greater than or equal to 90% of their needs  MONITOR:   PO intake, Supplement acceptance  REASON FOR ASSESSMENT:   Consult Wound healing  ASSESSMENT:   73 y.o. 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  Patient in room, brother at bedside. Pt reports eating with no issues PTA. Not happy about having heart healthy diet, will liberalize to just CHO modified. Discussed with pt optimal diet for wound healing. Open to protein shakes and supplements.  Ate 100% of his breakfast this morning.  Reports taking a daily senior MVI at home.   Per weight records, pt's weight has been trending up over past 2 years.  Medications: Multivitamin with minerals daily  Labs reviewed: CBGs: 97-144   NUTRITION - FOCUSED PHYSICAL EXAM:  Flowsheet Row Most Recent Value  Orbital Region No depletion  Upper Arm Region No depletion  Thoracic and Lumbar Region No depletion  Buccal Region No depletion  Temple Region No depletion  Clavicle Bone Region No depletion  Clavicle and Acromion Bone Region No depletion  Scapular Bone Region No depletion  Dorsal Hand No depletion  Patellar Region No depletion  Anterior  Thigh Region No depletion  Posterior Calf Region No depletion  Edema (RD Assessment) Severe  [RLE]  Hair Reviewed  Eyes Reviewed  Mouth Reviewed  Skin Reviewed  Nails Reviewed    Diet Order:   Diet Order             Diet Carb Modified Fluid consistency: Thin; Room service appropriate? Yes  Diet effective now                   EDUCATION NEEDS:   Education needs have been addressed  Skin:  Skin Assessment: Skin Integrity Issues: Skin Integrity Issues:: Diabetic Ulcer Diabetic Ulcer: right foot, right shin  Last BM:  10/20  Height:   Ht Readings from Last 1 Encounters:  10/24/23 6' 2 (1.88 m)    Weight:   Wt Readings from Last 1 Encounters:  10/24/23 113.9 kg    BMI:  Body mass index is 32.24 kg/m.  Estimated Nutritional Needs:   Kcal:  2100-2300  Protein:  115-130g  Fluid:  2.1L/day   Morna Lee, MS, RD, LDN Inpatient Clinical Dietitian Contact via Secure chat

## 2023-10-24 NOTE — Evaluation (Signed)
 Physical Therapy Evaluation Patient Details Name: [name] MRN: [id number] DOB: [birth year] Today's Date: 10/24/2023  History of Present Illness  Pt admitted from home with R LE cellulitis.  Pt with hx of CAD, aortic stenosis, DM with diabetic retinopathy and neuropathy, TIA, CABG, aortic valve replacement and R side ankle fx and tibial fx  Clinical Impression  Pt admitted as above and presenting with functional mobility limitations 2* ambulatory balance deficits related to neuropathy, R great toe amp and leg length discrepancy (does better with shoes on); and decreased activity tolerance related to prior back injury.  Pt should progress to return home with family assist.       If plan is discharge home, recommend the following:     Can travel by private vehicle        Equipment Recommendations None recommended by PT  Recommendations for Other Services       Functional Status Assessment Patient has had a recent decline in their functional status and demonstrates the ability to make significant improvements in function in a reasonable and predictable amount of time.     Precautions / Restrictions Precautions Precautions: Fall Recall of Precautions/Restrictions: Intact Precaution/Restrictions Comments: Balance deficits related to bil neuropathy, R great toe amp, and leg length descrepancy Required Braces or Orthoses: Other Brace (R shoe built up to compensate for LE length descrepancy) Restrictions Weight Bearing Restrictions Per Provider Order: No      Mobility  Bed Mobility Overal bed mobility: Modified Independent                  Transfers Overall transfer level: Modified independent                      Ambulation/Gait Ambulation/Gait assistance: Supervision, Modified independent (Device/Increase time) Gait Distance (Feet): 200 Feet Assistive device: Straight cane Gait Pattern/deviations: Step-through pattern, Decreased step length - right,  Decreased step length - left, Shuffle, Wide base of support Gait velocity: mod pace     General Gait Details: mild instability with widened BOS to compensate;  mild increased in instability with increased distance and pt states back pain is starting  Careers information officer     Tilt Bed    Modified Rankin (Stroke Patients Only)       Balance Overall balance assessment: Needs assistance Sitting-balance support: No upper extremity supported, Feet supported Sitting balance-Leahy Scale: Good       Standing balance-Leahy Scale: Good                               Pertinent Vitals/Pain Pain Assessment Pain Assessment: 0-10 Pain Score: 0-No pain (0 with R LE elevated but 4 with R LE dependency) Pain Intervention(s): Limited activity within patient's tolerance, Monitored during session, Repositioned    Home Living Family/patient expects to be discharged to:: Private residence Living Arrangements: Spouse/significant other Available Help at Discharge: Family;Available 24 hours/day Type of Home: House Home Access: Stairs to enter;Level entry     Alternate Level Stairs-Number of Steps: has elevator Home Layout: Multi-level Home Equipment: Agricultural consultant (2 wheels);Cane - single point (knee scooter)      Prior Function Prior Level of Function : Independent/Modified Independent             Mobility Comments: Limited ambulation 2* increasing back pain       Extremity/Trunk Assessment   Upper  Extremity Assessment Upper Extremity Assessment: Overall WFL for tasks assessed    Lower Extremity Assessment Lower Extremity Assessment: RLE deficits/detail;LLE deficits/detail RLE Deficits / Details: limited ankle movement RLE Sensation: history of peripheral neuropathy LLE Sensation: history of peripheral neuropathy    Cervical / Trunk Assessment Cervical / Trunk Assessment: Normal  Communication   Communication Communication: No  apparent difficulties    Cognition Arousal: Alert Behavior During Therapy: WFL for tasks assessed/performed   PT - Cognitive impairments: No apparent impairments                         Following commands: Intact       Cueing Cueing Techniques: Gestural cues     General Comments      Exercises     Assessment/Plan    PT Assessment Patient needs continued PT services  PT Problem List Decreased activity tolerance;Decreased balance;Decreased mobility;Pain       PT Treatment Interventions DME instruction;Gait training;Stair training;Functional mobility training;Therapeutic activities;Therapeutic exercise;Balance training;Patient/family education    PT Goals (Current goals can be found in the Care Plan section)  Acute Rehab PT Goals Patient Stated Goal: HOME PT Goal Formulation: With patient Time For Goal Achievement: 11/06/23 Potential to Achieve Goals: Good    Frequency Min 2X/week     Co-evaluation               AM-PAC PT 6 Clicks Mobility  Outcome Measure Help needed turning from your back to your side while in a flat bed without using bedrails?: None Help needed moving from lying on your back to sitting on the side of a flat bed without using bedrails?: None Help needed moving to and from a bed to a chair (including a wheelchair)?: None Help needed standing up from a chair using your arms (e.g., wheelchair or bedside chair)?: None Help needed to walk in hospital room?: A Little Help needed climbing 3-5 steps with a railing? : A Little 6 Click Score: 22    End of Session Equipment Utilized During Treatment: Gait belt Activity Tolerance: Patient tolerated treatment well Patient left: in bed;with call bell/phone within reach Nurse Communication: Mobility status PT Visit Diagnosis: Difficulty in walking, not elsewhere classified (R26.2)    Time: 9157-9094 PT Time Calculation (min) (ACUTE ONLY): 23 min   Charges:   PT Evaluation $PT Eval  Low Complexity: 1 Low   PT General Charges $$ ACUTE PT VISIT: 1 Visit         [facility] PT Acute Rehabilitation Services Office [phone]   Audra Kagel 10/24/2023, 9:31 AM

## 2023-10-24 NOTE — Consult Note (Signed)
 WOC Nurse Consult Note: Reason for Consult: Consult requested for right heel and right leg.  Performed remotely after review of progress notes and photos in the EMR.  Right leg with generalized edema and erythremia.  Right anterior calf with dry black eschar to a full thickness wound Right heel with chronic full thickness wound with dry yellow wound bed with callous edges surrounding. Appearance and location are NOT consistent with a pressure injury.   Dressing procedure/placement/frequency: Float heel to reduce pressure.Topical treatment orders provided for bedside nurses to perform as follows to assist with removal of nonviable tissue: Apply Santyl to right heel and right shin wounds Q day, then cover with moist 2X2 gauze and foam dressing. Change foam dressing Q 3 days or PRN soiling.  Please re-consult if further assistance is needed.  Thank-you,  Stephane Fought MSN, RN, CWOCN, CWCN-AP, CNS Contact Mon-Fri 0700-1500: [phone]

## 2023-10-24 NOTE — Progress Notes (Addendum)
 PROGRESS NOTE    [name]  FMW:[id number] DOB: 08-Feb-1950 DOA: 10/23/2023 PCP: Darilyn Rosalva Bruckner, PA-C   Brief Narrative: 73 year old with past medical history significant for CAD status post CABG July 2017, aortic stenosis status post bioprosthetic AVR in 2017, carotid stenosis post stenting June 2016, diabetes type 1, brittle, chronic heart ejection fraction, hypothyroidism, OSA, history of CVA with trace right-sided deficits presents with right lower extremity redness pain and edema.  Symptoms started over the last 24 to 48 hours.  Admitted for right lower extremity cellulitis.  X-ray showed extensive soft tissue edema in the right lower extremity   Assessment & Plan:   Principal Problem:   Cellulitis Active Problems:   Hypothyroidism   Aortic stenosis, moderate   CAD (coronary artery disease)   Poorly controlled type 1 diabetes mellitus with circulatory disorder (HCC)   OSA (obstructive sleep apnea)   Essential hypertension   Stroke (HCC)   Stage 3a chronic kidney disease (CKD) (HCC)   1-Right lower extremity cellulitis: Sepsis secondary to lower extremity infection, presented with low-grade fever temperature 101.1, blood pressures of 94/43, tachycardia heart rate 106, tachypnea.  -Presented with right lower extremity edema, pain and redness, elevated CRP at 13, leukocytosis, ESR 40 -X-ray extensive soft tissue edema the right lower extremity - Doppler: No evidence of deep vein thrombosis in the lower extremity.  No cystic structure found in the popliteal fossa.  Enlarged lymph node in the groin - Continue IV Unasyn and linezolid -Redness improving. -Wound care consulted for right heel chronic wound - Blood cultures pending  2-CKD-stage III A - Creatinine baseline 1.2--1.5 - Monitor stable  History of stroke - Continue aspirin  and Plavix   Hypertension: Continue Cozaar.  Diabetes type 1 brittle diabetic - On insulin  pump - A1c 7.0  CAD: -History  of CABG times 03/23/2015 - Continue aspirin  and Plavix   Aortic stenosis -Status post bioprosthetic AVR 2017  Hypothyroidism -Continue with Synthroid    Estimated body mass index is 31.46 kg/m as calculated from the following:   Height as of 05/10/23: 6' 2 (1.88 m).   Weight as of 05/10/23: 111.1 kg.   DVT prophylaxis: Lovenox  Code Status: Full code Family Communication: Care discussed with patient Disposition Plan:  Status is: Inpatient Remains inpatient appropriate because: Management of right lower extremity cellulitis    Consultants:  none  Procedures:  doppler  Antimicrobials:    Subjective: He reports redness, still having significant swelling of the right leg, he has some chronic swelling on that leg.    Objective: Vitals:   10/23/23 1700 10/23/23 2204 10/24/23 0156 10/24/23 0636  BP: (!) 148/58 (!) 141/53 136/64 113/63  Pulse: 87 95 83 81  Resp:  (!) 23 18 16   Temp: 98 F (36.7 C) 97.8 F (36.6 C) 98.2 F (36.8 C) 98.6 F (37 C)  TempSrc: Oral Oral Oral Oral  SpO2: 97% 98% 95% 95%    Intake/Output Summary (Last 24 hours) at 10/24/2023 9256 Last data filed at 10/23/2023 1725 Gross per 24 hour  Intake 117.48 ml  Output --  Net 117.48 ml   There were no vitals filed for this visit.  Examination:  General exam: Appears calm and comfortable  Respiratory system: Clear to auscultation. Respiratory effort normal. Cardiovascular system: S1 & S2 heard, RRR. No JVD, murmurs, rubs, gallops or clicks. No pedal edema. Gastrointestinal system: Abdomen is nondistended, soft and nontender. No organomegaly or masses felt. Normal bowel sounds heard. Central nervous system: Alert and oriented. No focal neurological deficits.  Extremities: Right lower extremity with redness, edema, warm to palpation  Data Reviewed: I have personally reviewed following labs and imaging studies  CBC: Recent Labs  Lab 10/23/23 0120 10/24/23 0452  WBC 19.1* 9.5  NEUTROABS 17.4*   --   HGB 12.0* 10.6*  HCT 34.8* 32.2*  MCV 89.5 93.9  PLT 206 166   Basic Metabolic Panel: Recent Labs  Lab 10/23/23 0120 10/24/23 0452  NA 139 138  K 4.6 4.0  CL 103 104  CO2 25 25  GLUCOSE 129* 131*  BUN 27* 26*  CREATININE 1.25* 1.12  CALCIUM  8.9 8.2*   GFR: CrCl cannot be calculated (Unknown ideal weight.). Liver Function Tests: Recent Labs  Lab 10/24/23 0452  AST 32  ALT 17  ALKPHOS 90  BILITOT 0.6  PROT 5.7*  ALBUMIN  3.3*   No results for input(s): LIPASE, AMYLASE in the last 168 hours. No results for input(s): AMMONIA in the last 168 hours. Coagulation Profile: No results for input(s): INR, PROTIME in the last 168 hours. Cardiac Enzymes: No results for input(s): CKTOTAL, CKMB, CKMBINDEX, TROPONINI in the last 168 hours. BNP (last 3 results) No results for input(s): PROBNP in the last 8760 hours. HbA1C: Recent Labs    10/23/23 1627  HGBA1C 7.0*   CBG: Recent Labs  Lab 10/23/23 1227 10/23/23 1631 10/23/23 2158  GLUCAP 239* 97 133*   Lipid Profile: No results for input(s): CHOL, HDL, LDLCALC, TRIG, CHOLHDL, LDLDIRECT in the last 72 hours. Thyroid  Function Tests: No results for input(s): TSH, T4TOTAL, FREET4, T3FREE, THYROIDAB in the last 72 hours. Anemia Panel: No results for input(s): VITAMINB12, FOLATE, FERRITIN, TIBC, IRON, RETICCTPCT in the last 72 hours. Sepsis Labs: Recent Labs  Lab 10/23/23 0120 10/23/23 1349  LATICACIDVEN 1.2 1.0    Recent Results (from the past 240 hours)  Resp panel by RT-PCR (RSV, Flu A&B, Covid) Anterior Nasal Swab     Status: None   Collection Time: 10/23/23  1:20 AM   Specimen: Anterior Nasal Swab  Result Value Ref Range Status   SARS Coronavirus 2 by RT PCR NEGATIVE NEGATIVE Final    Comment: (NOTE) SARS-CoV-2 target nucleic acids are NOT DETECTED.  The SARS-CoV-2 RNA is generally detectable in upper respiratory specimens during the acute phase of  infection. The lowest concentration of SARS-CoV-2 viral copies this assay can detect is 138 copies/mL. A negative result does not preclude SARS-Cov-2 infection and should not be used as the sole basis for treatment or other patient management decisions. A negative result may occur with  improper specimen collection/handling, submission of specimen other than nasopharyngeal swab, presence of viral mutation(s) within the areas targeted by this assay, and inadequate number of viral copies(<138 copies/mL). A negative result must be combined with clinical observations, patient history, and epidemiological information. The expected result is Negative.  Fact Sheet for Patients:  BloggerCourse.com  Fact Sheet for Healthcare Providers:  SeriousBroker.it  This test is no t yet approved or cleared by the [place]  FDA and  has been authorized for detection and/or diagnosis of SARS-CoV-2 by FDA under an Emergency Use Authorization (EUA). This EUA will remain  in effect (meaning this test can be used) for the duration of the COVID-19 declaration under Section 564(b)(1) of the Act, 21 U.S.C.section 360bbb-3(b)(1), unless the authorization is terminated  or revoked sooner.       Influenza A by PCR NEGATIVE NEGATIVE Final   Influenza B by PCR NEGATIVE NEGATIVE Final    Comment: (NOTE) The Xpert Xpress  SARS-CoV-2/FLU/RSV plus assay is intended as an aid in the diagnosis of influenza from Nasopharyngeal swab specimens and should not be used as a sole basis for treatment. Nasal washings and aspirates are unacceptable for Xpert Xpress SARS-CoV-2/FLU/RSV testing.  Fact Sheet for Patients: BloggerCourse.com  Fact Sheet for Healthcare Providers: SeriousBroker.it  This test is not yet approved or cleared by the [place]  FDA and has been authorized for detection and/or diagnosis of SARS-CoV-2  by FDA under an Emergency Use Authorization (EUA). This EUA will remain in effect (meaning this test can be used) for the duration of the COVID-19 declaration under Section 564(b)(1) of the Act, 21 U.S.C. section 360bbb-3(b)(1), unless the authorization is terminated or revoked.     Resp Syncytial Virus by PCR NEGATIVE NEGATIVE Final    Comment: (NOTE) Fact Sheet for Patients: BloggerCourse.com  Fact Sheet for Healthcare Providers: SeriousBroker.it  This test is not yet approved or cleared by the [place]  FDA and has been authorized for detection and/or diagnosis of SARS-CoV-2 by FDA under an Emergency Use Authorization (EUA). This EUA will remain in effect (meaning this test can be used) for the duration of the COVID-19 declaration under Section 564(b)(1) of the Act, 21 U.S.C. section 360bbb-3(b)(1), unless the authorization is terminated or revoked.  Performed at [facility], [address], [place], [place] [address]          Radiology Studies: DG Foot 2 Views Right Result Date: 10/23/2023 CLINICAL DATA:  Right leg cellulitis and history of prior first toe amputation EXAM: RIGHT FOOT - 2 VIEW COMPARISON:  03/20/2023 FINDINGS: First toe amputation is again noted. Mild osteopenia is noted. Irregularity of the second proximal phalanx is seen which is felt to be posttraumatic in nature. No bony erosive changes to suggest osteomyelitis are seen. Chronic calcaneal fracture is noted. Changes of prior fusion are seen involving the distal tibia, talus and calcaneus. IMPRESSION: Chronic changes without acute abnormality. Electronically Signed   By: Oneil Devonshire M.D.   On: 10/23/2023 21:56   VAS US  LOWER EXTREMITY VENOUS (DVT) Result Date: 10/23/2023  Lower Venous DVT Study Patient Name:  BODHI MORADI  Date of Exam:   10/23/2023 Medical Rec #: [id number]         Accession #:    [id number] Date of Birth:  Jun 15, 1950         Patient Gender: M Patient Age:   78 years Exam Location:  [facility] Procedure:      VAS US  LOWER EXTREMITY VENOUS (DVT) Referring Phys: ELSPETH NEWTON --------------------------------------------------------------------------------  Indications: Pain, Swelling, Erythema, and Cellulitis.  Limitations: Poor ultrasound/tissue interface, musculoskeletal features and edema. Comparison Study: Prior negative right LEV done 10/18/21 Performing Technologist: Alberta Lis RVS  Examination Guidelines: A complete evaluation includes B-mode imaging, spectral Doppler, color Doppler, and power Doppler as needed of all accessible portions of each vessel. Bilateral testing is considered an integral part of a complete examination. Limited examinations for reoccurring indications may be performed as noted. The reflux portion of the exam is performed with the patient in reverse Trendelenburg.  +---------+---------------+---------+-----------+----------+-------------------+ RIGHT    CompressibilityPhasicitySpontaneityPropertiesThrombus Aging      +---------+---------------+---------+-----------+----------+-------------------+ CFV      Full           Yes      No                                       +---------+---------------+---------+-----------+----------+-------------------+  SFJ      Full                                                             +---------+---------------+---------+-----------+----------+-------------------+ FV Prox  Full           Yes      Yes                                      +---------+---------------+---------+-----------+----------+-------------------+ FV Mid   Full                                                             +---------+---------------+---------+-----------+----------+-------------------+ FV DistalFull                                                              +---------+---------------+---------+-----------+----------+-------------------+ PFV                                                   Not well visualized +---------+---------------+---------+-----------+----------+-------------------+ POP      Full           Yes      Yes                                      +---------+---------------+---------+-----------+----------+-------------------+ PTV                                                   Not well visualized +---------+---------------+---------+-----------+----------+-------------------+ PERO                                                  Not well visualized +---------+---------------+---------+-----------+----------+-------------------+   +----+---------------+---------+-----------+----------+--------------+ LEFTCompressibilityPhasicitySpontaneityPropertiesThrombus Aging +----+---------------+---------+-----------+----------+--------------+ CFV Full           Yes      Yes                                 +----+---------------+---------+-----------+----------+--------------+ SFJ Full                                                        +----+---------------+---------+-----------+----------+--------------+  Summary: RIGHT: - There is no evidence of deep vein thrombosis in the lower extremity. However, portions of this examination were limited- see technologist comments above.  - No cystic structure found in the popliteal fossa. - Ultrasound characteristics of enlarged lymph nodes are noted in the groin.  LEFT: - No evidence of common femoral vein obstruction.   *See table(s) above for measurements and observations.    Preliminary    DG Tibia/Fibula Right Result Date: 10/23/2023 EXAM: _VIEWS_ VIEW(S) XRAY OF THE RIGHT TIBIA AND FIBULA 10/23/2023 01:17:42 AM COMPARISON: None available. CLINICAL HISTORY: sob. Pt c/o chills, fever, RLE swelling, redness first noticed this evening. Pt advises temp as high as  103.6, hovering between 101-103. FINDINGS: BONES AND JOINTS: Intramedullary rod and screw fixation of the right tibia. Spine screw fixation of the distal tibia. No acute fracture. No evidence of osteomyelitis. SOFT TISSUES: Diffuse edema about the right lower extremity. IMPRESSION: 1. Extensive soft tissue edema of the right lower extremity. Correlate for cellulitis. Electronically signed by: Norman Gatlin MD 10/23/2023 01:22 AM EDT RP Workstation: [id number]   DG Chest 1 View Result Date: 10/23/2023 EXAM: 1 VIEW XRAY OF THE CHEST 10/23/2023 01:17:42 AM COMPARISON: Comparison with 05/24/2022. CLINICAL HISTORY: sob. Pt c/o chills, fever, RLE swelling, redness first noticed this evening. Pt advises temp as high as 103.6, hovering between 101-103. FINDINGS: LUNGS AND PLEURA: Bibasilar atelectasis or scarring. No pulmonary edema. No pleural effusion. No pneumothorax. HEART AND MEDIASTINUM: Stable cardiomediastinal silhouette. Sternotomy and CABG. AVR. BONES AND SOFT TISSUES: No acute osseous abnormality. IMPRESSION: 1. No acute cardiopulmonary process. Electronically signed by: Norman Gatlin MD 10/23/2023 01:20 AM EDT RP Workstation: [id number]        Scheduled Meds:  aspirin  EC  81 mg Oral QPM   clopidogrel   75 mg Oral Q1200   enoxaparin  (LOVENOX ) injection  40 mg Subcutaneous Q24H   insulin  pump   Subcutaneous TID WC, HS, 0200   levothyroxine   150 mcg Oral Q0600   losartan  25 mg Oral Daily   multivitamin with minerals  1 tablet Oral QPM   pantoprazole   40 mg Oral QPM   tamsulosin   0.4 mg Oral QPM   Continuous Infusions:  ampicillin-sulbactam (UNASYN) IV 3 g (10/24/23 0508)   lactated ringers      linezolid (ZYVOX) IV 600 mg (10/24/23 0505)     LOS: 1 day    Time spent: 35 Minutes    Denissa Cozart A Elzora Cullins, MD [facility]   If 7PM-7AM, please contact night-coverage [email or web address]  10/24/2023, 7:43 AM

## 2023-10-25 DIAGNOSIS — E039 Hypothyroidism, unspecified: Secondary | ICD-10-CM

## 2023-10-25 DIAGNOSIS — I2583 Coronary atherosclerosis due to lipid rich plaque: Secondary | ICD-10-CM

## 2023-10-25 DIAGNOSIS — I350 Nonrheumatic aortic (valve) stenosis: Secondary | ICD-10-CM

## 2023-10-25 DIAGNOSIS — I2510 Atherosclerotic heart disease of native coronary artery without angina pectoris: Secondary | ICD-10-CM | POA: Diagnosis not present

## 2023-10-25 DIAGNOSIS — N1831 Chronic kidney disease, stage 3a: Secondary | ICD-10-CM

## 2023-10-25 DIAGNOSIS — A419 Sepsis, unspecified organism: Secondary | ICD-10-CM | POA: Diagnosis not present

## 2023-10-25 DIAGNOSIS — E1065 Type 1 diabetes mellitus with hyperglycemia: Secondary | ICD-10-CM

## 2023-10-25 DIAGNOSIS — L0390 Cellulitis, unspecified: Secondary | ICD-10-CM | POA: Diagnosis not present

## 2023-10-25 DIAGNOSIS — I10 Essential (primary) hypertension: Secondary | ICD-10-CM | POA: Diagnosis not present

## 2023-10-25 DIAGNOSIS — E1059 Type 1 diabetes mellitus with other circulatory complications: Secondary | ICD-10-CM

## 2023-10-25 LAB — GLUCOSE, CAPILLARY
Glucose-Capillary: 160 mg/dL — ABNORMAL HIGH (ref 70–99)
Glucose-Capillary: 172 mg/dL — ABNORMAL HIGH (ref 70–99)

## 2023-10-25 MED ORDER — LORATADINE 10 MG PO TABS
10.0000 mg | ORAL_TABLET | Freq: Every day | ORAL | Status: DC
  Administered 2023-10-25 – 2023-10-26 (×2): 10 mg via ORAL
  Filled 2023-10-25 (×2): qty 1

## 2023-10-25 MED ORDER — FUROSEMIDE 10 MG/ML IJ SOLN
40.0000 mg | Freq: Once | INTRAMUSCULAR | Status: AC
  Administered 2023-10-25: 40 mg via INTRAVENOUS
  Filled 2023-10-25: qty 4

## 2023-10-25 NOTE — Inpatient Diabetes Management (Signed)
 Inpatient Diabetes Program Recommendations  AACE/ADA: New Consensus Statement on Inpatient Glycemic Control (2015)  Target Ranges:  Prepandial:   less than 140 mg/dL      Peak postprandial:   less than 180 mg/dL (1-2 hours)      Critically ill patients:  140 - 180 mg/dL   Lab Results  Component Value Date   GLUCAP 160 (H) 10/25/2023   HGBA1C 7.0 (H) 10/23/2023    Review of Glycemic Control  Diabetes history: DM1 Outpatient Diabetes medications: Insulin  pump Current orders for Inpatient glycemic control: Insulin  pump - T-slim with Humalog   Endo - Patel. HgbA1C - 7% Insulin  pump settings: Basal: MN 0.4 0500 - 1.1 2200 - 0.4 CF - 40 CHO ratio 1:6  Inpatient Diabetes Program Recommendations:    Continue with insulin  pump with pt managing it. Contract signed.   Continue to follow.  Thank you. Shona Brandy, RD, LDN, CDCES Inpatient Diabetes Coordinator [phone]

## 2023-10-25 NOTE — Plan of Care (Signed)
   Problem: Health Behavior/Discharge Planning: Goal: Ability to manage health-related needs will improve Outcome: Progressing   Problem: Clinical Measurements: Goal: Ability to maintain clinical measurements within normal limits will improve Outcome: Progressing Goal: Will remain free from infection Outcome: Progressing Goal: Diagnostic test results will improve Outcome: Progressing Goal: Respiratory complications will improve Outcome: Progressing

## 2023-10-25 NOTE — Progress Notes (Signed)
 PROGRESS NOTE    [name]  FMW:[id number] DOB: May 26, 1950 DOA: 10/23/2023 PCP: Darilyn Rosalva Bruckner, PA-C    Chief Complaint  Patient presents with   Cellulitis    RLE    Brief Narrative:  73 year old with past medical history significant for CAD status post CABG July 2017, aortic stenosis status post bioprosthetic AVR in 2017, carotid stenosis post stenting June 2016, diabetes type 1, brittle, chronic heart ejection fraction, hypothyroidism, OSA, history of CVA with trace right-sided deficits presents with right lower extremity redness pain and edema.  Symptoms started over the last 24 to 48 hours.   Admitted for right lower extremity cellulitis.  X-ray showed extensive soft tissue edema in the right lower extremity     Assessment & Plan:   Principal Problem:   Sepsis due to cellulitis [facility]) Active Problems:   Cellulitis   Hypothyroidism   Aortic stenosis, moderate   CAD (coronary artery disease)   Poorly controlled type 1 diabetes mellitus with circulatory disorder (HCC)   OSA (obstructive sleep apnea)   Essential hypertension   Stroke (HCC)   Stage 3a chronic kidney disease (CKD) (HCC)  #1 sepsis secondary to right lower extremity cellulitis - Patient on admission met criteria for sepsis with a temp of 101.1, blood pressure of 94/43, tachycardia with heart rate of 106, tachypnea. - Patient noted on presentation to have right lower extremity edema, pain and erythema with a elevated CRP at 13, leukocytosis and ESR of 40. - Plain films done with extensive soft tissue edema in the right lower extremity. - Lower extremity Dopplers done negative for DVT. - Patient with no purulent discharge. - Clinical improvement. - Continue IV Unasyn - Discontinued linezolid. - Patient seen by wound care for right heel chronic wound. - Blood cultures pending. - Give a dose of Lasix  40 mg IV x 1 and monitor urine output. - Keep right lower extremity elevated. - Supportive  care.  2.  CKD stage IIIa -Baseline creatinine 1.2-1.5. - Renal function currently at baseline. - Follow-up.  3.  History of CVA -Continue aspirin  and Plavix  for secondary stroke prophylaxis.  4.  Hypertension -Cozaar.  5.  Diabetes mellitus type 1, brittle diabetic -Hemoglobin A1c 7.0. - CBG 160 this morning. - Continue insulin  pump.  6.  CAD status post CABG - Continue aspirin , Plavix . - Outpatient follow-up.  7.  Aortic stenosis status post bioprosthetic AVR 2017 -Outpatient follow-up with cardiology.  8.  Hypothyroidism -Continue home regimen Synthroid .   DVT prophylaxis: Lovenox  Code Status: Full Family Communication: Updated patient and wife at bedside. Disposition: Home when clinically improved.  Status is: Inpatient Remains inpatient appropriate because: Severity of illness   Consultants:  None  Procedures:  Plain films of the right foot 10/23/2023 Chest x-ray 10/23/2023 Plain films of the right tib-fib 10/23/2023 Lower extremity Dopplers 10/23/2023  Antimicrobials:  Anti-infectives (From admission, onward)    Start     Dose/Rate Route Frequency Ordered Stop   10/23/23 1700  Ampicillin-Sulbactam (UNASYN) 3 g in sodium chloride  0.9 % 100 mL IVPB        3 g 200 mL/hr over 30 Minutes Intravenous Every 6 hours 10/23/23 1519     10/23/23 1600  linezolid (ZYVOX) IVPB 600 mg  Status:  Discontinued       Placed in And Linked Group   600 mg 300 mL/hr over 60 Minutes Intravenous Every 12 hours 10/23/23 1517 10/25/23 1456   10/23/23 0200  cefTRIAXone  (ROCEPHIN ) 1 g in sodium chloride  0.9 %  100 mL IVPB        1 g 200 mL/hr over 30 Minutes Intravenous  Once 10/23/23 0152 10/23/23 0245         Subjective: Patient sitting up in recliner.  States some improvement with right lower extremity cellulitis however still with erythema.  Rash looking appearance has improved per wife.  Patient denies any chest pain, no shortness of breath, no abdominal  pain.  Objective: Vitals:   10/24/23 1309 10/24/23 2036 10/25/23 0531 10/25/23 1300  BP: (!) 129/59 (!) 129/51 (!) 144/60 (!) 145/79  Pulse: 80 79 84 81  Resp:  18 18 20   Temp: 98.5 F (36.9 C) 98.3 F (36.8 C) 98 F (36.7 C) 97.6 F (36.4 C)  TempSrc: Oral Oral Oral Oral  SpO2: 99% 99% 97% 96%  Weight:      Height:        Intake/Output Summary (Last 24 hours) at 10/25/2023 1731 Last data filed at 10/25/2023 1540 Gross per 24 hour  Intake 640 ml  Output 1375 ml  Net -735 ml   Filed Weights   10/24/23 0835  Weight: 113.9 kg    Examination:  General exam: Appears calm and comfortable  Respiratory system: Clear to auscultation. Respiratory effort normal. Cardiovascular system: S1 & S2 heard, RRR. No JVD, murmurs, rubs, gallops or clicks.  4+ right lower extremity edema.  Gastrointestinal system: Abdomen is nondistended, soft and nontender. No organomegaly or masses felt. Normal bowel sounds heard. Central nervous system: Alert and oriented. No focal neurological deficits. Extremities: 4+ right lower extremity edema.  Skin: No rashes, lesions or ulcers Psychiatry: Judgement and insight appear normal. Mood & affect appropriate.     Data Reviewed: I have personally reviewed following labs and imaging studies  CBC: Recent Labs  Lab 10/23/23 0120 10/24/23 0452  WBC 19.1* 9.5  NEUTROABS 17.4*  --   HGB 12.0* 10.6*  HCT 34.8* 32.2*  MCV 89.5 93.9  PLT 206 166    Basic Metabolic Panel: Recent Labs  Lab 10/23/23 0120 10/24/23 0452  NA 139 138  K 4.6 4.0  CL 103 104  CO2 25 25  GLUCOSE 129* 131*  BUN 27* 26*  CREATININE 1.25* 1.12  CALCIUM  8.9 8.2*    GFR: Estimated Creatinine Clearance: 76.5 mL/min (by C-G formula based on SCr of 1.12 mg/dL).  Liver Function Tests: Recent Labs  Lab 10/24/23 0452  AST 32  ALT 17  ALKPHOS 90  BILITOT 0.6  PROT 5.7*  ALBUMIN  3.3*    CBG: Recent Labs  Lab 10/23/23 1631 10/23/23 2158 10/24/23 0809  10/25/23 0801 10/25/23 1632  GLUCAP 97 133* 144* 160* 172*     Recent Results (from the past 240 hours)  Resp panel by RT-PCR (RSV, Flu A&B, Covid) Anterior Nasal Swab     Status: None   Collection Time: 10/23/23  1:20 AM   Specimen: Anterior Nasal Swab  Result Value Ref Range Status   SARS Coronavirus 2 by RT PCR NEGATIVE NEGATIVE Final    Comment: (NOTE) SARS-CoV-2 target nucleic acids are NOT DETECTED.  The SARS-CoV-2 RNA is generally detectable in upper respiratory specimens during the acute phase of infection. The lowest concentration of SARS-CoV-2 viral copies this assay can detect is 138 copies/mL. A negative result does not preclude SARS-Cov-2 infection and should not be used as the sole basis for treatment or other patient management decisions. A negative result may occur with  improper specimen collection/handling, submission of specimen other than nasopharyngeal swab, presence  of viral mutation(s) within the areas targeted by this assay, and inadequate number of viral copies(<138 copies/mL). A negative result must be combined with clinical observations, patient history, and epidemiological information. The expected result is Negative.  Fact Sheet for Patients:  BloggerCourse.com  Fact Sheet for Healthcare Providers:  SeriousBroker.it  This test is no t yet approved or cleared by the [place]  FDA and  has been authorized for detection and/or diagnosis of SARS-CoV-2 by FDA under an Emergency Use Authorization (EUA). This EUA will remain  in effect (meaning this test can be used) for the duration of the COVID-19 declaration under Section 564(b)(1) of the Act, 21 U.S.C.section 360bbb-3(b)(1), unless the authorization is terminated  or revoked sooner.       Influenza A by PCR NEGATIVE NEGATIVE Final   Influenza B by PCR NEGATIVE NEGATIVE Final    Comment: (NOTE) The Xpert Xpress SARS-CoV-2/FLU/RSV plus assay  is intended as an aid in the diagnosis of influenza from Nasopharyngeal swab specimens and should not be used as a sole basis for treatment. Nasal washings and aspirates are unacceptable for Xpert Xpress SARS-CoV-2/FLU/RSV testing.  Fact Sheet for Patients: BloggerCourse.com  Fact Sheet for Healthcare Providers: SeriousBroker.it  This test is not yet approved or cleared by the [place]  FDA and has been authorized for detection and/or diagnosis of SARS-CoV-2 by FDA under an Emergency Use Authorization (EUA). This EUA will remain in effect (meaning this test can be used) for the duration of the COVID-19 declaration under Section 564(b)(1) of the Act, 21 U.S.C. section 360bbb-3(b)(1), unless the authorization is terminated or revoked.     Resp Syncytial Virus by PCR NEGATIVE NEGATIVE Final    Comment: (NOTE) Fact Sheet for Patients: BloggerCourse.com  Fact Sheet for Healthcare Providers: SeriousBroker.it  This test is not yet approved or cleared by the [place]  FDA and has been authorized for detection and/or diagnosis of SARS-CoV-2 by FDA under an Emergency Use Authorization (EUA). This EUA will remain in effect (meaning this test can be used) for the duration of the COVID-19 declaration under Section 564(b)(1) of the Act, 21 U.S.C. section 360bbb-3(b)(1), unless the authorization is terminated or revoked.  Performed at [facility], [address] [place], [place] [address]   Culture, blood (routine x 2)     Status: None (Preliminary result)   Collection Time: 10/23/23  1:30 AM   Specimen: BLOOD  Result Value Ref Range Status   Specimen Description   Final    BLOOD RIGHT ANTECUBITAL Performed at [facility], [address] [place], [place] [address]    Special Requests   Final    BOTTLES DRAWN AEROBIC AND ANAEROBIC Blood  Culture adequate volume Performed at [facility], [address], [place], [place] [address]    Culture   Final    NO GROWTH 2 DAYS Performed at [facility], [address] [place], [place] [address]    Report Status PENDING  Incomplete  Culture, blood (routine x 2)     Status: None (Preliminary result)   Collection Time: 10/23/23  3:24 AM   Specimen: BLOOD RIGHT FOREARM  Result Value Ref Range Status   Specimen Description   Final    BLOOD RIGHT FOREARM Performed at [facility], [address] [place], [place] [address]    Special Requests   Final    BOTTLES DRAWN AEROBIC AND ANAEROBIC Blood Culture adequate volume Performed at [facility], [address] [place], [place]  [address]    Culture   Final    NO GROWTH 2 DAYS Performed at [facility], [address] [place], [place] [address]    Report Status PENDING  Incomplete         Radiology Studies: VAS US  LOWER EXTREMITY VENOUS (DVT) Result Date: 10/24/2023  Lower Venous DVT Study Patient Name:  ALDO SONDGEROTH  Date of Exam:   10/23/2023 Medical Rec #: [id number]         Accession #:    [id number] Date of Birth: [birth year]         Patient Gender: M Patient Age:   66 years Exam Location:  [facility] Procedure:      VAS US  LOWER EXTREMITY VENOUS (DVT) Referring Phys: ELSPETH NEWTON --------------------------------------------------------------------------------  Indications: Pain, Swelling, Erythema, and Cellulitis.  Limitations: Poor ultrasound/tissue interface, musculoskeletal features and edema. Comparison Study: Prior negative right LEV done 10/18/21 Performing Technologist: Alberta Lis RVS  Examination Guidelines: A complete evaluation includes B-mode imaging, spectral Doppler, color Doppler, and power Doppler as needed of all accessible portions of each vessel. Bilateral testing is considered an integral part of a complete examination.  Limited examinations for reoccurring indications may be performed as noted. The reflux portion of the exam is performed with the patient in reverse Trendelenburg.  +---------+---------------+---------+-----------+----------+-------------------+ RIGHT    CompressibilityPhasicitySpontaneityPropertiesThrombus Aging      +---------+---------------+---------+-----------+----------+-------------------+ CFV      Full           Yes      No                                       +---------+---------------+---------+-----------+----------+-------------------+ SFJ      Full                                                             +---------+---------------+---------+-----------+----------+-------------------+ FV Prox  Full           Yes      Yes                                      +---------+---------------+---------+-----------+----------+-------------------+ FV Mid   Full                                                             +---------+---------------+---------+-----------+----------+-------------------+ FV DistalFull                                                             +---------+---------------+---------+-----------+----------+-------------------+ PFV  Not well visualized +---------+---------------+---------+-----------+----------+-------------------+ POP      Full           Yes      Yes                                      +---------+---------------+---------+-----------+----------+-------------------+ PTV                                                   Not well visualized +---------+---------------+---------+-----------+----------+-------------------+ PERO                                                  Not well visualized +---------+---------------+---------+-----------+----------+-------------------+   +----+---------------+---------+-----------+----------+--------------+  LEFTCompressibilityPhasicitySpontaneityPropertiesThrombus Aging +----+---------------+---------+-----------+----------+--------------+ CFV Full           Yes      Yes                                 +----+---------------+---------+-----------+----------+--------------+ SFJ Full                                                        +----+---------------+---------+-----------+----------+--------------+     Summary: RIGHT: - There is no evidence of deep vein thrombosis in the lower extremity. However, portions of this examination were limited- see technologist comments above.  - No cystic structure found in the popliteal fossa. - Ultrasound characteristics of enlarged lymph nodes are noted in the groin.  LEFT: - No evidence of common femoral vein obstruction.   *See table(s) above for measurements and observations. Electronically signed by Debby Robertson on 10/24/2023 at 10:25:27 AM.    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 Mild osteopenia is noted. Irregularity of the second proximal phalanx is seen which is felt to be posttraumatic in nature. No bony erosive changes to suggest osteomyelitis are seen. Chronic calcaneal fracture is noted. Changes of prior fusion are seen involving the distal tibia, talus and calcaneus. IMPRESSION: Chronic changes without acute abnormality. Electronically Signed   By: Oneil Devonshire M.D.   On: 10/23/2023 21:56        Scheduled Meds:  ascorbic acid  500 mg Oral BID   aspirin  EC  81 mg Oral QPM   clopidogrel   75 mg Oral Q1200   collagenase   Topical Daily   enoxaparin  (LOVENOX ) injection  40 mg Subcutaneous Q24H   insulin  pump   Subcutaneous TID WC, HS, 0200   levothyroxine   150 mcg Oral Q0600   loratadine  10 mg Oral Daily   losartan  25 mg Oral Daily    multivitamin with minerals  1 tablet Oral QPM   pantoprazole   40 mg Oral QPM   Ensure Max Protein  11 oz Oral BID  tamsulosin   0.4 mg Oral QPM   zinc sulfate (50mg  elemental zinc)  220 mg Oral Daily   Continuous Infusions:  ampicillin-sulbactam (UNASYN) IV 3 g (10/25/23 1714)   lactated ringers        LOS: 2 days    Time spent: 40 minutes    Toribio Hummer, MD [facility]   To contact the attending provider between 7A-7P or the covering provider during after hours 7P-7A, please log into the web site [email or web address] and access using universal [place] password for that web site. If you do not have the password, please call the hospital operator.  10/25/2023, 5:31 PM

## 2023-10-26 ENCOUNTER — Other Ambulatory Visit (HOSPITAL_COMMUNITY): Payer: Self-pay

## 2023-10-26 DIAGNOSIS — G4733 Obstructive sleep apnea (adult) (pediatric): Secondary | ICD-10-CM

## 2023-10-26 DIAGNOSIS — I639 Cerebral infarction, unspecified: Secondary | ICD-10-CM

## 2023-10-26 DIAGNOSIS — A419 Sepsis, unspecified organism: Secondary | ICD-10-CM | POA: Diagnosis not present

## 2023-10-26 DIAGNOSIS — L0390 Cellulitis, unspecified: Secondary | ICD-10-CM | POA: Diagnosis not present

## 2023-10-26 DIAGNOSIS — I2510 Atherosclerotic heart disease of native coronary artery without angina pectoris: Secondary | ICD-10-CM | POA: Diagnosis not present

## 2023-10-26 LAB — CBC WITH DIFFERENTIAL/PLATELET
Abs Immature Granulocytes: 0.03 K/uL (ref 0.00–0.07)
Basophils Absolute: 0 K/uL (ref 0.0–0.1)
Basophils Relative: 0 %
Eosinophils Absolute: 0.4 K/uL (ref 0.0–0.5)
Eosinophils Relative: 6 %
HCT: 32.5 % — ABNORMAL LOW (ref 39.0–52.0)
Hemoglobin: 10.6 g/dL — ABNORMAL LOW (ref 13.0–17.0)
Immature Granulocytes: 1 %
Lymphocytes Relative: 20 %
Lymphs Abs: 1.3 K/uL (ref 0.7–4.0)
MCH: 30.5 pg (ref 26.0–34.0)
MCHC: 32.6 g/dL (ref 30.0–36.0)
MCV: 93.7 fL (ref 80.0–100.0)
Monocytes Absolute: 0.6 K/uL (ref 0.1–1.0)
Monocytes Relative: 9 %
Neutro Abs: 4.1 K/uL (ref 1.7–7.7)
Neutrophils Relative %: 64 %
Platelets: 193 K/uL (ref 150–400)
RBC: 3.47 MIL/uL — ABNORMAL LOW (ref 4.22–5.81)
RDW: 12.5 % (ref 11.5–15.5)
WBC: 6.3 K/uL (ref 4.0–10.5)
nRBC: 0 % (ref 0.0–0.2)

## 2023-10-26 LAB — BASIC METABOLIC PANEL WITH GFR
Anion gap: 8 (ref 5–15)
BUN: 24 mg/dL — ABNORMAL HIGH (ref 8–23)
CO2: 29 mmol/L (ref 22–32)
Calcium: 8.5 mg/dL — ABNORMAL LOW (ref 8.9–10.3)
Chloride: 106 mmol/L (ref 98–111)
Creatinine, Ser: 1.19 mg/dL (ref 0.61–1.24)
GFR, Estimated: >60 mL/min (ref >60)
Glucose, Bld: 152 mg/dL — ABNORMAL HIGH (ref 70–99)
Potassium: 5 mmol/L (ref 3.5–5.1)
Sodium: 142 mmol/L (ref 135–145)

## 2023-10-26 LAB — GLUCOSE, CAPILLARY
Glucose-Capillary: 176 mg/dL — ABNORMAL HIGH (ref 70–99)
Glucose-Capillary: 89 mg/dL (ref 70–99)
Glucose-Capillary: 123 mg/dL — ABNORMAL HIGH (ref 70–99)

## 2023-10-26 MED ORDER — AMOXICILLIN-POT CLAVULANATE 875-125 MG PO TABS
1.0000 | ORAL_TABLET | Freq: Two times a day (BID) | ORAL | 0 refills | Status: AC
Start: 2023-10-26 — End: 2023-11-02
  Filled 2023-10-26: qty 14, 7d supply, fill #0

## 2023-10-26 MED ORDER — ASCORBIC ACID 500 MG PO TABS: 500.0000 mg | ORAL_TABLET | Freq: Two times a day (BID) | ORAL | Status: AC

## 2023-10-26 MED ORDER — ONDANSETRON HCL 4 MG PO TABS
4.0000 mg | ORAL_TABLET | Freq: Four times a day (QID) | ORAL | 0 refills | Status: AC | PRN
  Filled 2023-10-26: qty 20, 20d supply, fill #0

## 2023-10-26 MED ORDER — ZINC SULFATE 220 (50 ZN) MG PO CAPS: 220.0000 mg | ORAL_CAPSULE | Freq: Every day | ORAL | Status: AC

## 2023-10-26 MED ORDER — COLLAGENASE 250 UNIT/GM EX OINT
TOPICAL_OINTMENT | Freq: Every day | CUTANEOUS | 0 refills | Status: DC
  Filled 2023-10-26: qty 90, 30d supply, fill #0

## 2023-10-26 NOTE — Plan of Care (Signed)
  Problem: Health Behavior/Discharge Planning: Goal: Ability to manage health-related needs will improve Outcome: Progressing   Problem: Clinical Measurements: Goal: Ability to maintain clinical measurements within normal limits will improve Outcome: Progressing Goal: Will remain free from infection Outcome: Progressing Goal: Diagnostic test results will improve Outcome: Progressing Goal: Respiratory complications will improve Outcome: Progressing Goal: Cardiovascular complication will be avoided Outcome: Progressing   Problem: Activity: Goal: Risk for activity intolerance will decrease Outcome: Progressing   Problem: Nutrition: Goal: Adequate nutrition will be maintained Outcome: Progressing   Problem: Elimination: Goal: Will not experience complications related to bowel motility Outcome: Progressing   Problem: Pain Managment: Goal: General experience of comfort will improve and/or be controlled Outcome: Progressing   Problem: Safety: Goal: Ability to remain free from injury will improve Outcome: Progressing

## 2023-10-26 NOTE — Progress Notes (Signed)
 Discharge medications delivered to patient at the bedside in a secure bag.

## 2023-10-26 NOTE — TOC Transition Note (Signed)
 Transition of Care [facility]) - Discharge Note   Patient Details  Name: [name] MRN: [id number] Date of Birth: 02-19-1950  Transition of Care [facility]) CM/SW Contact:  Bascom Service, RN Phone Number: 10/26/2023, 1:17 PM   Clinical Narrative: d/c home No CM needs. Has own transport home.      Final next level of care: Home/Self Care Barriers to Discharge: No Barriers Identified   Patient Goals and CMS Choice Patient states their goals for this hospitalization and ongoing recovery are:: Home CMS Medicare.gov Compare Post Acute Care list provided to:: Patient Choice offered to / list presented to : Patient [place] ownership interest in [facility].provided to:: Patient    Discharge Placement                       Discharge Plan and Services Additional resources added to the After Visit Summary for     Discharge Planning Services: CM Consult Post Acute Care Choice: Resumption of Svcs/PTA Provider                               Social Drivers of Health (SDOH) Interventions SDOH Screenings   Food Insecurity: No Food Insecurity (10/23/2023)  Housing: Low Risk  (10/23/2023)  Transportation Needs: No Transportation Needs (10/23/2023)  Utilities: Not At Risk (10/23/2023)  Social Connections: Moderately Isolated (10/23/2023)  Tobacco Use: Medium Risk (10/23/2023)     Readmission Risk Interventions     No data to display

## 2023-10-26 NOTE — Consult Note (Addendum)
 WOC Nurse wound follow up Bedside nurse sent a secure chat message that the right leg wound is no longer covered with eschar, it is red and moist.  Orders changed to the following:   1. Apply Santyl to right heel Q day, then cover with moist 2X2 gauze and foam dressing. Change foam dressing Q 3 days or PRN soiling 2. Apply foam dressing to right leg wound and change Q 3 days or PRN.  Please re-consult if further assistance is needed.  Thank-you,  Stephane Fought MSN, RN, CWOCN, CWCN-AP, CNS Contact Mon-Fri 0700-1500: [phone]

## 2023-10-26 NOTE — Discharge Summary (Signed)
 Physician Discharge Summary  [name] FMW:[id number] DOB: 05-25-1950 DOA: 10/23/2023  PCP: Nathaniel Rosalva Bruckner, PA-C  Admit date: 10/23/2023 Discharge date: 10/26/2023  Time spent: 60 minutes  Recommendations for Outpatient Follow-up:  Follow-up with Podraza, Cole Christopher, PA-C in 1 to 2 weeks.  On follow-up patient will need a basic metabolic profile and to follow-up on electrolytes and renal function.     Discharge Diagnoses:  Principal Problem:   Sepsis due to cellulitis [facility]) Active Problems:   Cellulitis   Hypothyroidism   Aortic stenosis, moderate   CAD (coronary artery disease)   Poorly controlled type 1 diabetes mellitus with circulatory disorder (HCC)   OSA (obstructive sleep apnea)   Essential hypertension   Stroke (HCC)   Stage 3a chronic kidney disease (CKD) (HCC)   Discharge Condition: Stable and improved.  Diet recommendation: Carb modified diet  Filed Weights   10/24/23 0835 10/26/23 0407  Weight: 113.9 kg 113.2 kg    History of present illness:  HPI per Dr. Eldonna Charlie LITTIE Carter is a 73 y.o. 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  Patient reports abrupt onset of right lower extremity redness and swelling over the past 24 to 48 hours.  Patient denies any prior episodes like this in the past.  Has chronic lower extremity swelling and mild venous stasis changes.  No known trauma to the right lower extremity though there is a noted healing wound on the right anterior shin.  Baseline type 1 diabetes with insulin  pump in place.  Sugars have been fairly brittle chronically.  Usually A1c is around 67 though  blood sugar ranges been 100s to 200s.  No reported alcohol or tobacco use.  No reported recent medication changes.  Has had worsening right lower extremity redness and swelling.  No purulent drainage.  Also with mild ulceration of the right heel.  No chest pain or shortness of breath.  No abdominal pain or diarrhea. Presented to the ER Tmax 100.1, heart rate 100s, BP relatively stable.  White count 19.1, hemoglobin 12, platelets 206, creatinine 1.25.  Glucose 129.  Lactate within normal limits.  Chest x-ray within normal limits.  Right tib-fib with extensive soft tissue swelling concerning for cellulitis.  Hospital Course:  #1 sepsis secondary to right lower extremity cellulitis - Patient on admission met criteria for sepsis with a temp of 101.1, blood pressure of 94/43, tachycardia with heart rate of 106, tachypnea. - Patient noted on presentation to have right lower extremity edema, pain and erythema with a elevated CRP at 13, leukocytosis and ESR of 40. - Plain films done with extensive soft tissue edema in the right lower extremity. - Lower extremity Dopplers done negative for DVT. - Patient with no purulent discharge. - Patient placed on IV Unasyn and Zyvox initially on presentation.  -Blood cultures obtained with no growth to date x 3 days by day of discharge.  -Patient improved clinically and subsequently linezolid was discontinued and patient maintained on IV Unasyn. - Patient seen by wound care for right heel chronic wound and dressing recommendations made. - Patient status post 1 dose IV Lasix  with good urine output. - Patient improved clinically and will be discharged home on 7 more days of oral  Augmentin to complete a 10-day course of antibiotic treatment due to prior history of osteomyelitis and multiple surgical procedures in the past. - Outpatient follow-up with PCP.   2.  CKD stage IIIa -Baseline creatinine 1.2-1.5. - Renal function remained stable throughout the hospitalization.     3.  History of CVA - Patient maintained on home regimen aspirin  and Plavix  for secondary stroke prophylaxis.   4.  Hypertension -Cozaar.   5.  Diabetes mellitus type 1, brittle diabetic -Hemoglobin A1c 7.0. - Patient maintained on insulin  pump during the hospitalization and blood glucose levels well-controlled.   - Patient seen and followed by diabetic coordinator.   - Outpatient follow-up with PCP.  CB   6.  CAD status post CABG - Patient maintained on home regimen aspirin , Plavix . - Outpatient follow-up.   7.  Aortic stenosis status post bioprosthetic AVR 2017 -Outpatient follow-up with cardiology.   8.  Hypothyroidism - Patient maintained on home regimen Synthroid .    Procedures: Plain films of the right foot 10/23/2023 Chest x-ray 10/23/2023 Plain films of the right tib-fib 10/23/2023 Lower extremity Dopplers 10/23/2023  Consultations: None  Discharge Exam: Vitals:   10/26/23 0431 10/26/23 1310  BP: (!) 117/59 137/67  Pulse: 80 82  Resp: 15 17  Temp: 97.9 F (36.6 C) (!) 97.4 F (36.3 C)  SpO2: 98% 98%    General: NAD Cardiovascular: RRR no murmurs rubs or gallops.  No JVD.  No lower extremity edema. Respiratory: Clear to auscultation bilaterally.  No wheezes, no crackles, no rhonchi.  Fair air movement.  Speaking in full sentences.  Discharge Instructions   Discharge Instructions     Diet Carb Modified   Complete by: As directed    Discharge wound care:   Complete by: As directed    Wound care  Daily      Comments: Apply Santyl to right heel and right shin wounds Q day, then cover with moist 2X2 gauze and foam dressing. Change foam dressing Q 3 days or PRN soiling  10/24/23 1515   Increase activity slowly   Complete by: As directed       Allergies as of 10/26/2023       Reactions   Crestor  [rosuvastatin  Calcium ] Other (See Comments)   Myalgias Lethargy   Statins Other (See Comments)   Problem with higher dosages (Lipitor caused memory  issues) Myalgias Lethargy   Lipitor [atorvastatin ] Other (See Comments)   Myalgais  Lethargy  Memory issues, brain fog   Tape Rash, Other (See Comments)   Adhesive tape.  (Paper tape OK)        Medication List     TAKE these medications    acetaminophen  500 MG tablet Commonly known as: TYLENOL  Take 1,000 mg by mouth 2 (two) times daily as needed for headache, fever or moderate pain (pain score 4-6).   amoxicillin-clavulanate 875-125 MG tablet Commonly known as: AUGMENTIN Take 1 tablet by mouth 2 (two) times daily for 7 days.   ascorbic acid 500 MG tablet Commonly known as: VITAMIN C Take 1 tablet (500 mg total) by mouth 2 (two) times daily for 7 days.   aspirin  EC 81 MG tablet Take 81 mg by mouth every evening.   cetirizine 10 MG tablet Commonly known as: ZYRTEC Take 10 mg by mouth every evening.   clopidogrel  75 MG tablet Commonly known as: PLAVIX  Take 1 tablet (75 mg total) by mouth daily at 12 noon.   clotrimazole -betamethasone  cream Commonly known as: LOTRISONE  Apply 1 Application  topically 2 (two) times daily as needed (skin irritation).   collagenase 250 UNIT/GM ointment Commonly known as: SANTYL Apply topically daily. Apply Santyl to right heel and right shin wounds every day, then cover with moist 2X2 gauze and foam dressing. Change foam dressing every 3 days or as needed for soiling Start taking on: October 27, 2023   hydrochlorothiazide 12.5 MG tablet Commonly known as: HYDRODIURIL Take 12.5 mg by mouth daily.   insulin  lispro 100 UNIT/ML injection Commonly known as: HumaLOG  Use 60 units via insulin  pump. What changed:  how much to take how to take this when to take this additional instructions   levothyroxine  150 MCG tablet Commonly known as: SYNTHROID  Take 150 mcg by mouth daily before breakfast.   loperamide 2 MG tablet Commonly known as: IMODIUM A-D Take 2 mg by mouth daily as needed for diarrhea or loose stools.   losartan 25 MG  tablet Commonly known as: COZAAR Take 25 mg by mouth daily.   omeprazole  20 MG tablet Commonly known as: PRILOSEC  OTC Take 20 mg by mouth every evening.   ondansetron  4 MG tablet Commonly known as: ZOFRAN  Take 1 tablet (4 mg total) by mouth every 6 (six) hours as needed for nausea.   One-A-Day Mens 50+ Tabs Take 1 tablet by mouth every evening.   Repatha  SureClick 140 MG/ML Soaj Generic drug: Evolocumab  INJECT 140 MG INTO THE SKIN EVERY 14 (FOURTEEN) DAYS.   tamsulosin  0.4 MG Caps capsule Commonly known as: FLOMAX  Take 0.4 mg by mouth every evening.   zinc sulfate (50mg  elemental zinc) 220 (50 Zn) MG capsule Take 1 capsule (220 mg total) by mouth daily for 14 days. Start taking on: October 27, 2023               Discharge Care Instructions  (From admission, onward)           Start     Ordered   10/26/23 0000  Discharge wound care:       Comments: Wound care  Daily      Comments: Apply Santyl to right heel and right shin wounds Q day, then cover with moist 2X2 gauze and foam dressing. Change foam dressing Q 3 days or PRN soiling  10/24/23 1515   10/26/23 1256           Allergies  Allergen Reactions   Crestor  [Rosuvastatin  Calcium ] Other (See Comments)    Myalgias Lethargy   Statins Other (See Comments)    Problem with higher dosages (Lipitor caused memory issues) Myalgias Lethargy   Lipitor [Atorvastatin ] Other (See Comments)    Myalgais  Lethargy  Memory issues, brain fog   Tape Rash and Other (See Comments)    Adhesive tape.  (Paper tape OK)    Follow-up Information     Podraza, Rosalva Bruckner, PA-C. Schedule an appointment as soon as possible for a visit in 2 week(s).   Specialty: Physician Assistant Why: Follow-up in 1 to 2 weeks. Contact information: [id number] [place] [address] [phone]                  The results of significant diagnostics from this hospitalization (including imaging,  microbiology, ancillary and laboratory) are listed below for reference.    Significant Diagnostic Studies: VAS US  LOWER EXTREMITY VENOUS (DVT) Result Date: 10/24/2023  Lower Venous DVT Study Patient Name:  [name]  Date of Exam:   10/23/2023 Medical Rec #: [id number]  Accession #:    [id number] Date of Birth: 26-Feb-1950         Patient Gender: M Patient Age:   1 years Exam Location:  [facility] Procedure:      VAS US  LOWER EXTREMITY VENOUS (DVT) Referring Phys: ELSPETH NEWTON --------------------------------------------------------------------------------  Indications: Pain, Swelling, Erythema, and Cellulitis.  Limitations: Poor ultrasound/tissue interface, musculoskeletal features and edema. Comparison Study: Prior negative right LEV done 10/18/21 Performing Technologist: Alberta Lis RVS  Examination Guidelines: A complete evaluation includes B-mode imaging, spectral Doppler, color Doppler, and power Doppler as needed of all accessible portions of each vessel. Bilateral testing is considered an integral part of a complete examination. Limited examinations for reoccurring indications may be performed as noted. The reflux portion of the exam is performed with the patient in reverse Trendelenburg.  +---------+---------------+---------+-----------+----------+-------------------+ RIGHT    CompressibilityPhasicitySpontaneityPropertiesThrombus Aging      +---------+---------------+---------+-----------+----------+-------------------+ CFV      Full           Yes      No                                       +---------+---------------+---------+-----------+----------+-------------------+ SFJ      Full                                                             +---------+---------------+---------+-----------+----------+-------------------+ FV Prox  Full           Yes      Yes                                       +---------+---------------+---------+-----------+----------+-------------------+ FV Mid   Full                                                             +---------+---------------+---------+-----------+----------+-------------------+ FV DistalFull                                                             +---------+---------------+---------+-----------+----------+-------------------+ PFV                                                   Not well visualized +---------+---------------+---------+-----------+----------+-------------------+ POP      Full           Yes      Yes                                      +---------+---------------+---------+-----------+----------+-------------------+ PTV  Not well visualized +---------+---------------+---------+-----------+----------+-------------------+ PERO                                                  Not well visualized +---------+---------------+---------+-----------+----------+-------------------+   +----+---------------+---------+-----------+----------+--------------+ LEFTCompressibilityPhasicitySpontaneityPropertiesThrombus Aging +----+---------------+---------+-----------+----------+--------------+ CFV Full           Yes      Yes                                 +----+---------------+---------+-----------+----------+--------------+ SFJ Full                                                        +----+---------------+---------+-----------+----------+--------------+     Summary: RIGHT: - There is no evidence of deep vein thrombosis in the lower extremity. However, portions of this examination were limited- see technologist comments above.  - No cystic structure found in the popliteal fossa. - Ultrasound characteristics of enlarged lymph nodes are noted in the groin.  LEFT: - No evidence of common femoral vein obstruction.   *See table(s) above for  measurements and observations. Electronically signed by Debby Robertson on 10/24/2023 at 10:25:27 AM.    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 Mild osteopenia is noted. Irregularity of the second proximal phalanx is seen which is felt to be posttraumatic in nature. No bony erosive changes to suggest osteomyelitis are seen. Chronic calcaneal fracture is noted. Changes of prior fusion are seen involving the distal tibia, talus and calcaneus. IMPRESSION: Chronic changes without acute abnormality. Electronically Signed   By: Oneil Devonshire M.D.   On: 10/23/2023 21:56   DG Tibia/Fibula Right Result Date: 10/23/2023 EXAM: _VIEWS_ VIEW(S) XRAY OF THE RIGHT TIBIA AND FIBULA 10/23/2023 01:17:42 AM COMPARISON: None available. CLINICAL HISTORY: sob. Pt c/o chills, fever, RLE swelling, redness first noticed this evening. Pt advises temp as high as 103.6, hovering between 101-103. FINDINGS: BONES AND JOINTS: Intramedullary rod and screw fixation of the right tibia. Spine screw fixation of the distal tibia. No acute fracture. No evidence of osteomyelitis. SOFT TISSUES: Diffuse edema about the right lower extremity. IMPRESSION: 1. Extensive soft tissue edema of the right lower extremity. Correlate for cellulitis. Electronically signed by: Norman Gatlin MD 10/23/2023 01:22 AM EDT RP Workstation: [id number]   DG Chest 1 View Result Date: 10/23/2023 EXAM: 1 VIEW XRAY OF THE CHEST 10/23/2023 01:17:42 AM COMPARISON: Comparison with 05/24/2022. CLINICAL HISTORY: sob. Pt c/o chills, fever, RLE swelling, redness first noticed this evening. Pt advises temp as high as 103.6, hovering between 101-103. FINDINGS: LUNGS AND PLEURA: Bibasilar atelectasis or scarring. No pulmonary edema. No pleural effusion. No pneumothorax. HEART AND  MEDIASTINUM: Stable cardiomediastinal silhouette. Sternotomy and CABG. AVR. BONES AND SOFT TISSUES: No acute osseous abnormality. IMPRESSION: 1. No acute cardiopulmonary process. Electronically signed by: Norman Gatlin MD 10/23/2023 01:20 AM EDT RP Workstation: [id number]   ECHOCARDIOGRAM COMPLETE Result Date: 10/11/2023    ECHOCARDIOGRAM REPORT   Patient Name:   [name]  Date of Exam: 10/11/2023 Medical Rec #:  [id number]        Height:       74.0 in Accession #:    [id number]       Weight:       245.0 lb Date of Birth:  [birth year]        BSA:          2.369 m Patient Age:    73 years         BP:           115/49 mmHg Patient Gender: M                HR:           90 bpm. Exam Location:  [address] Procedure: 2D Echo, Cardiac Doppler and Color Doppler (Both Spectral and Color            Flow Doppler were utilized during procedure). Indications:    Z95.2 s/p AVR  History:        Patient has prior history of Echocardiogram examinations, most                 recent 10/12/2022. Prior CABG, PAD and Carotid Disease,                 Signs/Symptoms:Shortness of Breath; Risk Factors:Hypertension                 and HLD.                 Aortic Valve: 23 mm Magna Ease pericardial valve is present in                 the aortic position. Procedure Date: 06/10/2015.  Sonographer:    Waldo Guadalajara RCS Referring Phys: [id number] JONATHAN J BERRY IMPRESSIONS  1. Left ventricular ejection fraction, by estimation, is 60 to 65%. The left ventricle has normal function. The left ventricle has no regional wall motion abnormalities. There is mild concentric left ventricular hypertrophy. Left ventricular diastolic parameters are consistent with Grade II diastolic dysfunction (pseudonormalization).  2. Right ventricular systolic function is normal. The right ventricular size is normal. There is mildly elevated pulmonary artery systolic pressure. The estimated right ventricular systolic pressure is 37.6 mmHg.  3. Left atrial size was  mildly dilated.  4. The mitral valve is degenerative. Mild mitral valve regurgitation. No evidence of mitral stenosis.  5. Mild increase in gradient since previous echocardiogram. DVI 0.4, borderline acceleration time of 98ms suggest mild aortic valve stenosis. The aortic valve has been repaired/replaced. Aortic valve regurgitation is not visualized. Mild aortic valve stenosis. There is a 23 mm Magna Ease pericardial valve present in the aortic position. Procedure Date: 06/10/2015. Aortic valve mean gradient measures 19.0 mmHg. Aortic valve Vmax measures 2.70 m/s.  6. The inferior vena cava is normal in size with greater than 50% respiratory variability, suggesting right atrial pressure of 3 mmHg. FINDINGS  Left Ventricle: Left ventricular ejection fraction, by estimation, is 60 to 65%. The left ventricle has normal function. The left ventricle has no regional wall motion abnormalities. The left ventricular internal cavity size was normal in size. There is  mild concentric left ventricular hypertrophy. Abnormal (paradoxical) septal motion consistent with post-operative status. Left ventricular diastolic parameters are consistent with Grade II diastolic dysfunction (pseudonormalization). Right Ventricle: The right ventricular size is normal. No increase in right ventricular wall thickness. Right ventricular systolic function is normal. There is mildly elevated pulmonary artery  systolic pressure. The tricuspid regurgitant velocity is 2.94  m/s, and with an assumed right atrial pressure of 3 mmHg, the estimated right ventricular systolic pressure is 37.6 mmHg. Left Atrium: Left atrial size was mildly dilated. Right Atrium: Right atrial size was normal in size. Pericardium: There is no evidence of pericardial effusion. Mitral Valve: The mitral valve is degenerative in appearance. Mild mitral valve regurgitation. No evidence of mitral valve stenosis. Tricuspid Valve: The tricuspid valve is normal in structure. Tricuspid  valve regurgitation is mild . No evidence of tricuspid stenosis. Aortic Valve: Mild increase in gradient since previous echocardiogram. DVI 0.4, borderline acceleration time of 98ms suggest mild aortic valve stenosis. The aortic valve has been repaired/replaced. Aortic valve regurgitation is not visualized. Mild aortic stenosis is present. Aortic valve mean gradient measures 19.0 mmHg. Aortic valve peak gradient measures 29.2 mmHg. Aortic valve area, by VTI measures 1.22 cm. There is a 23 mm Magna Ease pericardial valve present in the aortic position. Procedure  Date: 06/10/2015. Pulmonic Valve: The pulmonic valve was normal in structure. Pulmonic valve regurgitation is mild. No evidence of pulmonic stenosis. Aorta: The aortic root is normal in size and structure. Venous: The inferior vena cava is normal in size with greater than 50% respiratory variability, suggesting right atrial pressure of 3 mmHg. IAS/Shunts: No atrial level shunt detected by color flow Doppler.  LEFT VENTRICLE PLAX 2D LVIDd:         4.30 cm   Diastology LVIDs:         3.20 cm   LV e' medial:    5.55 cm/s LV PW:         1.10 cm   LV E/e' medial:  24.7 LV IVS:        1.30 cm   LV e' lateral:   5.87 cm/s LVOT diam:     2.00 cm   LV E/e' lateral: 23.3 LV SV:         76 LV SV Index:   32 LVOT Area:     3.14 cm LV IVRT:       88 msec  RIGHT VENTRICLE RV Basal diam:  3.70 cm     PULMONARY VEINS RV S prime:     13.10 cm/s  A Reversal Velocity: 25.50 cm/s TAPSE (M-mode): 1.8 cm      Diastolic Velocity:  63.00 cm/s RVSP:           37.6 mmHg   S/D Velocity:        1.10                             Systolic Velocity:   71.40 cm/s LEFT ATRIUM             Index        RIGHT ATRIUM           Index LA diam:        4.90 cm 2.07 cm/m   RA Pressure: 3.00 mmHg LA Vol (A2C):   62.8 ml 26.51 ml/m  RA Area:     14.50 cm LA Vol (A4C):   80.0 ml 33.77 ml/m  RA Volume:   32.50 ml  13.72 ml/m LA Biplane Vol: 75.5 ml 31.87 ml/m  AORTIC VALVE AV Area (Vmax):    1.27  cm AV Area (Vmean):   1.26 cm AV Area (VTI):     1.22 cm AV Vmax:  270.00 cm/s AV Vmean:          203.000 cm/s AV VTI:            0.627 m AV Peak Grad:      29.2 mmHg AV Mean Grad:      19.0 mmHg LVOT Vmax:         109.00 cm/s LVOT Vmean:        81.600 cm/s LVOT VTI:          0.243 m LVOT/AV VTI ratio: 0.39  AORTA Ao Root diam: 3.30 cm Ao Asc diam:  3.30 cm MITRAL VALVE                TRICUSPID VALVE MV Area (PHT): 5.79 cm     TR Peak grad:   34.6 mmHg MV Decel Time: 131 msec     TR Vmax:        294.00 cm/s MV E velocity: 137.00 cm/s  Estimated RAP:  3.00 mmHg MV A velocity: 152.00 cm/s  RVSP:           37.6 mmHg MV E/A ratio:  0.90                             SHUNTS                             Systemic VTI:  0.24 m                             Systemic Diam: 2.00 cm Morene Brownie Electronically signed by Morene Brownie Signature Date/Time: 10/11/2023/8:46:22 PM    Final     Microbiology: Recent Results (from the past 240 hours)  Resp panel by RT-PCR (RSV, Flu A&B, Covid) Anterior Nasal Swab     Status: None   Collection Time: 10/23/23  1:20 AM   Specimen: Anterior Nasal Swab  Result Value Ref Range Status   SARS Coronavirus 2 by RT PCR NEGATIVE NEGATIVE Final    Comment: (NOTE) SARS-CoV-2 target nucleic acids are NOT DETECTED.  The SARS-CoV-2 RNA is generally detectable in upper respiratory specimens during the acute phase of infection. The lowest concentration of SARS-CoV-2 viral copies this assay can detect is 138 copies/mL. A negative result does not preclude SARS-Cov-2 infection and should not be used as the sole basis for treatment or other patient management decisions. A negative result may occur with  improper specimen collection/handling, submission of specimen other than nasopharyngeal swab, presence of viral mutation(s) within the areas targeted by this assay, and inadequate number of viral copies(<138 copies/mL). A negative result must be combined with clinical  observations, patient history, and epidemiological information. The expected result is Negative.  Fact Sheet for Patients:  BloggerCourse.com  Fact Sheet for Healthcare Providers:  SeriousBroker.it  This test is no t yet approved or cleared by the [place]  FDA and  has been authorized for detection and/or diagnosis of SARS-CoV-2 by FDA under an Emergency Use Authorization (EUA). This EUA will remain  in effect (meaning this test can be used) for the duration of the COVID-19 declaration under Section 564(b)(1) of the Act, 21 U.S.C.section 360bbb-3(b)(1), unless the authorization is terminated  or revoked sooner.       Influenza A by PCR NEGATIVE NEGATIVE Final   Influenza B by PCR NEGATIVE NEGATIVE Final    Comment: (NOTE) The  Xpert Xpress SARS-CoV-2/FLU/RSV plus assay is intended as an aid in the diagnosis of influenza from Nasopharyngeal swab specimens and should not be used as a sole basis for treatment. Nasal washings and aspirates are unacceptable for Xpert Xpress SARS-CoV-2/FLU/RSV testing.  Fact Sheet for Patients: BloggerCourse.com  Fact Sheet for Healthcare Providers: SeriousBroker.it  This test is not yet approved or cleared by the [place]  FDA and has been authorized for detection and/or diagnosis of SARS-CoV-2 by FDA under an Emergency Use Authorization (EUA). This EUA will remain in effect (meaning this test can be used) for the duration of the COVID-19 declaration under Section 564(b)(1) of the Act, 21 U.S.C. section 360bbb-3(b)(1), unless the authorization is terminated or revoked.     Resp Syncytial Virus by PCR NEGATIVE NEGATIVE Final    Comment: (NOTE) Fact Sheet for Patients: BloggerCourse.com  Fact Sheet for Healthcare Providers: SeriousBroker.it  This test is not yet approved or cleared by  the [place]  FDA and has been authorized for detection and/or diagnosis of SARS-CoV-2 by FDA under an Emergency Use Authorization (EUA). This EUA will remain in effect (meaning this test can be used) for the duration of the COVID-19 declaration under Section 564(b)(1) of the Act, 21 U.S.C. section 360bbb-3(b)(1), unless the authorization is terminated or revoked.  Performed at [facility], [address], [place], [place] [address]   Culture, blood (routine x 2)     Status: None (Preliminary result)   Collection Time: 10/23/23  1:30 AM   Specimen: BLOOD  Result Value Ref Range Status   Specimen Description   Final    BLOOD RIGHT ANTECUBITAL Performed at [facility], [address] [place], [place] [address]    Special Requests   Final    BOTTLES DRAWN AEROBIC AND ANAEROBIC Blood Culture adequate volume Performed at [facility], [address], [place], [place] [address]    Culture   Final    NO GROWTH 3 DAYS Performed at [facility], [address] [place], [place] [address]    Report Status PENDING  Incomplete  Culture, blood (routine x 2)     Status: None (Preliminary result)   Collection Time: 10/23/23  3:24 AM   Specimen: BLOOD RIGHT FOREARM  Result Value Ref Range Status   Specimen Description   Final    BLOOD RIGHT FOREARM Performed at [facility], [address] [place], [place] [address]    Special Requests   Final    BOTTLES DRAWN AEROBIC AND ANAEROBIC Blood Culture adequate volume Performed at [facility], [address] [place], [place] [address]    Culture   Final    NO GROWTH 3 DAYS Performed at [facility], [address] [place], [place] [address]    Report Status PENDING  Incomplete     Labs: Basic Metabolic Panel: Recent Labs  Lab 10/23/23 0120 10/24/23 0452 10/26/23 0507  NA 139 138 142  K 4.6 4.0 5.0  CL 103 104 106   CO2 25 25 29   GLUCOSE 129* 131* 152*  BUN 27* 26* 24*  CREATININE 1.25* 1.12 1.19  CALCIUM  8.9 8.2* 8.5*   Liver Function Tests: Recent Labs  Lab 10/24/23 0452  AST 32  ALT 17  ALKPHOS 90  BILITOT 0.6  PROT 5.7*  ALBUMIN  3.3*   No results for input(s): LIPASE, AMYLASE in the last 168 hours. No results for input(s): AMMONIA in the last 168 hours. CBC: Recent Labs  Lab  10/23/23 0120 10/24/23 0452 10/26/23 0507  WBC 19.1* 9.5 6.3  NEUTROABS 17.4*  --  4.1  HGB 12.0* 10.6* 10.6*  HCT 34.8* 32.2* 32.5*  MCV 89.5 93.9 93.7  PLT 206 166 193   Cardiac Enzymes: No results for input(s): CKTOTAL, CKMB, CKMBINDEX, TROPONINI in the last 168 hours. BNP: BNP (last 3 results) No results for input(s): BNP in the last 8760 hours.  ProBNP (last 3 results) No results for input(s): PROBNP in the last 8760 hours.  CBG: Recent Labs  Lab 10/25/23 0801 10/25/23 1632 10/26/23 0312 10/26/23 0726 10/26/23 1130  GLUCAP 160* 172* 176* 89 123*       Signed:  Toribio Hummer MD.  [facility] 10/26/2023, 3:41 PM

## 2023-10-28 ENCOUNTER — Other Ambulatory Visit (HOSPITAL_COMMUNITY): Payer: Self-pay

## 2023-10-28 LAB — CULTURE, BLOOD (ROUTINE X 2)
Culture: NO GROWTH
Culture: NO GROWTH
Special Requests: ADEQUATE
Special Requests: ADEQUATE

## 2023-12-09 ENCOUNTER — Emergency Department (HOSPITAL_BASED_OUTPATIENT_CLINIC_OR_DEPARTMENT_OTHER)

## 2023-12-09 ENCOUNTER — Inpatient Hospital Stay (HOSPITAL_BASED_OUTPATIENT_CLINIC_OR_DEPARTMENT_OTHER)
Admission: EM | Admit: 2023-12-09 | Discharge: 2023-12-15 | DRG: 872 | Disposition: A | Discharge status: Home / Self Care | Attending: Internal Medicine | Admitting: Internal Medicine

## 2023-12-09 ENCOUNTER — Other Ambulatory Visit: Payer: Self-pay

## 2023-12-09 ENCOUNTER — Encounter (HOSPITAL_BASED_OUTPATIENT_CLINIC_OR_DEPARTMENT_OTHER): Payer: Self-pay | Admitting: Emergency Medicine

## 2023-12-09 DIAGNOSIS — I2510 Atherosclerotic heart disease of native coronary artery without angina pectoris: Secondary | ICD-10-CM | POA: Diagnosis present

## 2023-12-09 DIAGNOSIS — B9620 Unspecified Escherichia coli [E. coli] as the cause of diseases classified elsewhere: Secondary | ICD-10-CM | POA: Diagnosis present

## 2023-12-09 DIAGNOSIS — Z7902 Long term (current) use of antithrombotics/antiplatelets: Secondary | ICD-10-CM

## 2023-12-09 DIAGNOSIS — D631 Anemia in chronic kidney disease: Secondary | ICD-10-CM | POA: Diagnosis present

## 2023-12-09 DIAGNOSIS — R5381 Other malaise: Secondary | ICD-10-CM | POA: Diagnosis present

## 2023-12-09 DIAGNOSIS — E669 Obesity, unspecified: Secondary | ICD-10-CM | POA: Diagnosis present

## 2023-12-09 DIAGNOSIS — E1042 Type 1 diabetes mellitus with diabetic polyneuropathy: Secondary | ICD-10-CM | POA: Diagnosis present

## 2023-12-09 DIAGNOSIS — Z888 Allergy status to other drugs, medicaments and biological substances status: Secondary | ICD-10-CM

## 2023-12-09 DIAGNOSIS — Z87891 Personal history of nicotine dependence: Secondary | ICD-10-CM

## 2023-12-09 DIAGNOSIS — E785 Hyperlipidemia, unspecified: Secondary | ICD-10-CM | POA: Diagnosis present

## 2023-12-09 DIAGNOSIS — Z8673 Personal history of transient ischemic attack (TIA), and cerebral infarction without residual deficits: Secondary | ICD-10-CM

## 2023-12-09 DIAGNOSIS — L97419 Non-pressure chronic ulcer of right heel and midfoot with unspecified severity: Secondary | ICD-10-CM | POA: Diagnosis present

## 2023-12-09 DIAGNOSIS — I5032 Chronic diastolic (congestive) heart failure: Secondary | ICD-10-CM | POA: Diagnosis present

## 2023-12-09 DIAGNOSIS — Z1619 Resistance to other specified beta lactam antibiotics: Secondary | ICD-10-CM | POA: Diagnosis present

## 2023-12-09 DIAGNOSIS — N1831 Chronic kidney disease, stage 3a: Secondary | ICD-10-CM | POA: Diagnosis present

## 2023-12-09 DIAGNOSIS — K219 Gastro-esophageal reflux disease without esophagitis: Secondary | ICD-10-CM | POA: Diagnosis present

## 2023-12-09 DIAGNOSIS — E10621 Type 1 diabetes mellitus with foot ulcer: Secondary | ICD-10-CM | POA: Diagnosis present

## 2023-12-09 DIAGNOSIS — N400 Enlarged prostate without lower urinary tract symptoms: Secondary | ICD-10-CM | POA: Diagnosis present

## 2023-12-09 DIAGNOSIS — Z79899 Other long term (current) drug therapy: Secondary | ICD-10-CM

## 2023-12-09 DIAGNOSIS — I130 Hypertensive heart and chronic kidney disease with heart failure and stage 1 through stage 4 chronic kidney disease, or unspecified chronic kidney disease: Secondary | ICD-10-CM | POA: Diagnosis present

## 2023-12-09 DIAGNOSIS — L03115 Cellulitis of right lower limb: Secondary | ICD-10-CM | POA: Diagnosis present

## 2023-12-09 DIAGNOSIS — Z1152 Encounter for screening for COVID-19: Secondary | ICD-10-CM

## 2023-12-09 DIAGNOSIS — A419 Sepsis, unspecified organism: Principal | ICD-10-CM | POA: Diagnosis present

## 2023-12-09 DIAGNOSIS — E10319 Type 1 diabetes mellitus with unspecified diabetic retinopathy without macular edema: Secondary | ICD-10-CM | POA: Diagnosis present

## 2023-12-09 DIAGNOSIS — E1022 Type 1 diabetes mellitus with diabetic chronic kidney disease: Secondary | ICD-10-CM | POA: Diagnosis present

## 2023-12-09 DIAGNOSIS — L0390 Cellulitis, unspecified: Secondary | ICD-10-CM

## 2023-12-09 DIAGNOSIS — E890 Postprocedural hypothyroidism: Secondary | ICD-10-CM | POA: Diagnosis present

## 2023-12-09 DIAGNOSIS — N179 Acute kidney failure, unspecified: Secondary | ICD-10-CM | POA: Diagnosis present

## 2023-12-09 DIAGNOSIS — Z794 Long term (current) use of insulin: Secondary | ICD-10-CM

## 2023-12-09 DIAGNOSIS — R509 Fever, unspecified: Principal | ICD-10-CM

## 2023-12-09 DIAGNOSIS — Z91048 Other nonmedicinal substance allergy status: Secondary | ICD-10-CM

## 2023-12-09 DIAGNOSIS — Z89412 Acquired absence of left great toe: Secondary | ICD-10-CM

## 2023-12-09 DIAGNOSIS — G4733 Obstructive sleep apnea (adult) (pediatric): Secondary | ICD-10-CM | POA: Diagnosis present

## 2023-12-09 DIAGNOSIS — Z951 Presence of aortocoronary bypass graft: Secondary | ICD-10-CM

## 2023-12-09 DIAGNOSIS — I350 Nonrheumatic aortic (valve) stenosis: Secondary | ICD-10-CM | POA: Diagnosis present

## 2023-12-09 DIAGNOSIS — E1051 Type 1 diabetes mellitus with diabetic peripheral angiopathy without gangrene: Secondary | ICD-10-CM | POA: Diagnosis present

## 2023-12-09 DIAGNOSIS — Z1611 Resistance to penicillins: Secondary | ICD-10-CM | POA: Diagnosis present

## 2023-12-09 DIAGNOSIS — Z79890 Hormone replacement therapy (postmenopausal): Secondary | ICD-10-CM

## 2023-12-09 DIAGNOSIS — Z7982 Long term (current) use of aspirin: Secondary | ICD-10-CM

## 2023-12-09 DIAGNOSIS — Z9641 Presence of insulin pump (external) (internal): Secondary | ICD-10-CM | POA: Diagnosis present

## 2023-12-09 DIAGNOSIS — R600 Localized edema: Secondary | ICD-10-CM

## 2023-12-09 DIAGNOSIS — Z713 Dietary counseling and surveillance: Secondary | ICD-10-CM

## 2023-12-09 DIAGNOSIS — M14671 Charcot's joint, right ankle and foot: Secondary | ICD-10-CM | POA: Diagnosis present

## 2023-12-09 DIAGNOSIS — Z6830 Body mass index (BMI) 30.0-30.9, adult: Secondary | ICD-10-CM

## 2023-12-09 DIAGNOSIS — Z953 Presence of xenogenic heart valve: Secondary | ICD-10-CM

## 2023-12-09 LAB — CBC WITH DIFFERENTIAL/PLATELET
Abs Immature Granulocytes: 0.05 K/uL (ref 0.00–0.07)
Basophils Absolute: 0.1 K/uL (ref 0.0–0.1)
Basophils Relative: 0 %
Eosinophils Absolute: 0.2 K/uL (ref 0.0–0.5)
Eosinophils Relative: 1 %
HCT: 36.6 % — ABNORMAL LOW (ref 39.0–52.0)
Hemoglobin: 12.5 g/dL — ABNORMAL LOW (ref 13.0–17.0)
Immature Granulocytes: 0 %
Lymphocytes Relative: 4 %
Lymphs Abs: 0.6 K/uL — ABNORMAL LOW (ref 0.7–4.0)
MCH: 30.9 pg (ref 26.0–34.0)
MCHC: 34.2 g/dL (ref 30.0–36.0)
MCV: 90.4 fL (ref 80.0–100.0)
Monocytes Absolute: 0.6 K/uL (ref 0.1–1.0)
Monocytes Relative: 4 %
Neutro Abs: 13.7 K/uL — ABNORMAL HIGH (ref 1.7–7.7)
Neutrophils Relative %: 91 %
Platelets: 204 K/uL (ref 150–400)
RBC: 4.05 MIL/uL — ABNORMAL LOW (ref 4.22–5.81)
RDW: 13 % (ref 11.5–15.5)
WBC: 15.2 K/uL — ABNORMAL HIGH (ref 4.0–10.5)
nRBC: 0 % (ref 0.0–0.2)

## 2023-12-09 LAB — COMPREHENSIVE METABOLIC PANEL WITH GFR
ALT: 17 U/L (ref 0–44)
AST: 28 U/L (ref 15–41)
Albumin: 4.1 g/dL (ref 3.5–5.0)
Alkaline Phosphatase: 116 U/L (ref 38–126)
Anion gap: 12 (ref 5–15)
BUN: 34 mg/dL — ABNORMAL HIGH (ref 8–23)
CO2: 25 mmol/L (ref 22–32)
Calcium: 9 mg/dL (ref 8.9–10.3)
Chloride: 102 mmol/L (ref 98–111)
Creatinine, Ser: 1.58 mg/dL — ABNORMAL HIGH (ref 0.61–1.24)
GFR, Estimated: 45 mL/min — ABNORMAL LOW (ref >60)
Glucose, Bld: 200 mg/dL — ABNORMAL HIGH (ref 70–99)
Potassium: 4.4 mmol/L (ref 3.5–5.1)
Sodium: 138 mmol/L (ref 135–145)
Total Bilirubin: 0.5 mg/dL (ref 0.0–1.2)
Total Protein: 7.4 g/dL (ref 6.5–8.1)

## 2023-12-09 LAB — RESP PANEL BY RT-PCR (RSV, FLU A&B, COVID)  RVPGX2
Influenza A by PCR: NEGATIVE
Influenza B by PCR: NEGATIVE
Resp Syncytial Virus by PCR: NEGATIVE
SARS Coronavirus 2 by RT PCR: NEGATIVE

## 2023-12-09 LAB — PROTIME-INR
INR: 1 (ref 0.8–1.2)
Prothrombin Time: 13.4 s (ref 11.4–15.2)

## 2023-12-09 LAB — LACTIC ACID, PLASMA: Lactic Acid, Venous: 1.7 mmol/L (ref 0.5–1.9)

## 2023-12-09 MED ORDER — VANCOMYCIN HCL IN DEXTROSE 1-5 GM/200ML-% IV SOLN: 1000.0000 mg | Freq: Once | INTRAVENOUS | Status: DC

## 2023-12-09 MED ORDER — VANCOMYCIN HCL IN DEXTROSE 1-5 GM/200ML-% IV SOLN
1000.0000 mg | Freq: Once | INTRAVENOUS | Status: AC
  Administered 2023-12-10: 1000 mg via INTRAVENOUS
  Filled 2023-12-09: qty 200

## 2023-12-09 MED ORDER — SODIUM CHLORIDE 0.9 % IV SOLN
2.0000 g | Freq: Once | INTRAVENOUS | Status: AC
  Administered 2023-12-09: 2 g via INTRAVENOUS
  Filled 2023-12-09: qty 12.5

## 2023-12-09 MED ORDER — LACTATED RINGERS IV SOLN: INTRAVENOUS | Status: DC

## 2023-12-09 MED ORDER — METRONIDAZOLE 500 MG/100ML IV SOLN
500.0000 mg | Freq: Once | INTRAVENOUS | Status: AC
  Administered 2023-12-09: 500 mg via INTRAVENOUS
  Filled 2023-12-09: qty 100

## 2023-12-09 MED ORDER — VANCOMYCIN HCL IN DEXTROSE 1-5 GM/200ML-% IV SOLN
1000.0000 mg | Freq: Once | INTRAVENOUS | Status: AC
  Administered 2023-12-09: 1000 mg via INTRAVENOUS
  Filled 2023-12-09: qty 200

## 2023-12-09 MED ORDER — SODIUM CHLORIDE 0.9 % IV BOLUS (SEPSIS)
1000.0000 mL | Freq: Once | INTRAVENOUS | Status: AC
  Administered 2023-12-09: 1000 mL via INTRAVENOUS

## 2023-12-09 NOTE — ED Notes (Signed)
 Patient is aware of the need for a urine sample, but unable to provide one at this time.

## 2023-12-09 NOTE — Progress Notes (Signed)
 Plan of Care Note for accepted transfer   Patient: [name] MRN: [id number]   DOA: 12/09/2023  Facility requesting transfer: [facility]   Requesting Provider: Dr. Ruthe   Reason for transfer: Cellulitis   Facility course: 73 yr old man with IDDM, HTN, CKD 3A, CAD, and CVA who presents with RLE redness and heat.   Temp is 100.1 F and HR in 120 range with stable BP. WBC is 15.2 with normal lactate.   Blood cultures were collected and he was given 1 L NS, vancomycin , cefepime , and Flagyl .   Plan of care: The patient is accepted for admission to Progressive unit, at [facility].   Author: Evalene GORMAN Sprinkles, MD 12/09/2023  Check [email or web address] for on-call coverage.  Nursing staff, Please call [facility] Admits & Consults System-Wide number on Amion as soon as patient's arrival, so appropriate admitting provider can evaluate the pt.

## 2023-12-09 NOTE — Progress Notes (Signed)
 Pt being followed by [facility] for Sepsis protocol.

## 2023-12-09 NOTE — ED Notes (Addendum)
 ED provider at bedside.

## 2023-12-09 NOTE — ED Provider Notes (Addendum)
 [place] EMERGENCY DEPARTMENT AT [facility] Provider Note   CSN: [id number] Arrival date & time: 12/09/23  2151     Patient presents with: Fever and Rash   [name] is a 73 y.o. male.   Patient here with fever body aches chills ongoing issue with right foot wound but maybe some new skin irritation in his right groin area.  He has a history of insulin -dependent diabetes.  History of CAD diabetic neuropathy Charcot right foot.  He denies being on any immunosuppressants.  Fever started earlier today.  He noticed a little bit of a rash in his right groin this morning and put some Silvadene cream on it.  Started to have some fevers and chills this afternoon/prior to coming here.  He took 1000 mg of Tylenol  prior to coming here.  His temperature at home was 102.  Denies any urinary symptoms abdominal pain nausea vomiting diarrhea.  Denies any cough or sputum production.  Just had podiatrist shaves down part of ulcer in the right posterior foot.        Prior to Admission medications   Medication Sig Start Date End Date Taking? Authorizing Provider  acetaminophen  (TYLENOL ) 500 MG tablet Take 1,000 mg by mouth 2 (two) times daily as needed for headache, fever or moderate pain (pain score 4-6).    [provider]  aspirin  EC 81 MG tablet Take 81 mg by mouth every evening.    [provider]  cetirizine (ZYRTEC) 10 MG tablet Take 10 mg by mouth every evening.    [provider]  clopidogrel  (PLAVIX ) 75 MG tablet Take 1 tablet (75 mg total) by mouth daily at 12 noon. 07/07/22   Barbarann Nest, MD  clotrimazole -betamethasone  (LOTRISONE ) cream Apply 1 Application topically 2 (two) times daily as needed (skin irritation). 03/08/23   [provider]  collagenase  (SANTYL ) 250 UNIT/GM ointment Apply topically daily. Apply Santyl  to right heel and right shin wounds every day, then cover with moist 2X2 gauze and foam dressing. Change foam dressing every 3  days or as needed for soiling 10/27/23   Sebastian Toribio GAILS, MD  Evolocumab  (REPATHA  SURECLICK) 140 MG/ML SOAJ INJECT 140 MG INTO THE SKIN EVERY 14 (FOURTEEN) DAYS. 08/22/23   Hilty, Vinie BROCKS, MD  hydrochlorothiazide (HYDRODIURIL) 12.5 MG tablet Take 12.5 mg by mouth daily.    [provider]  insulin  lispro (HUMALOG ) 100 UNIT/ML injection Use 60 units via insulin  pump. Patient taking differently: Inject 100 Units into the skin See admin instructions. Max daily dose 100 units via insulin  pump. Continuous. 08/01/16   Trixie File, MD  levothyroxine  (SYNTHROID ) 150 MCG tablet Take 150 mcg by mouth daily before breakfast. 08/19/21   [provider]  loperamide  (IMODIUM  A-D) 2 MG tablet Take 2 mg by mouth daily as needed for diarrhea or loose stools.    [provider]  losartan  (COZAAR ) 25 MG tablet Take 25 mg by mouth daily.    [provider]  Multiple Vitamins-Minerals (ONE-A-DAY MENS 50+) TABS Take 1 tablet by mouth every evening.    [provider]  omeprazole  (PRILOSEC  OTC) 20 MG tablet Take 20 mg by mouth every evening.    [provider]  ondansetron  (ZOFRAN ) 4 MG tablet Take 1 tablet (4 mg total) by mouth every 6 (six) hours as needed for nausea. 10/26/23   Sebastian Toribio GAILS, MD  tamsulosin  (FLOMAX ) 0.4 MG CAPS capsule Take 0.4 mg by mouth every evening. 07/07/21   [provider]  Allergies: Crestor  [rosuvastatin  calcium ], Statins, Lipitor [atorvastatin ], and Tape    Review of Systems  Updated Vital Signs BP (!) 151/74 (BP Location: Right Arm)   Pulse (!) 123   Temp 100.1 F (37.8 C) (Oral)   Resp (!) 22   Ht 6' 2 (1.88 m)   Wt 108 kg   SpO2 94%   BMI 30.56 kg/m   Physical Exam Vitals and nursing note reviewed. Exam conducted with a chaperone present.  Constitutional:      General: He is not in acute distress.    Appearance: He is well-developed. He is not ill-appearing.  HENT:     Head: Normocephalic and  atraumatic.     Nose: Nose normal.     Mouth/Throat:     Mouth: Mucous membranes are moist.  Eyes:     Extraocular Movements: Extraocular movements intact.     Conjunctiva/sclera: Conjunctivae normal.     Pupils: Pupils are equal, round, and reactive to light.  Cardiovascular:     Rate and Rhythm: Normal rate and regular rhythm.     Pulses: Normal pulses.     Heart sounds: Normal heart sounds. No murmur heard. Pulmonary:     Effort: Pulmonary effort is normal. No respiratory distress.     Breath sounds: Normal breath sounds.  Abdominal:     General: Abdomen is flat.     Palpations: Abdomen is soft.     Tenderness: There is no abdominal tenderness.  Musculoskeletal:        General: No swelling.     Cervical back: Normal range of motion and neck supple.  Skin:    General: Skin is warm and dry.     Capillary Refill: Capillary refill takes less than 2 seconds.     Findings: Erythema present.     Comments: Got a few areas of petechial type rash in the right side of his mons pubis, there is no crepitus and no evidence of Fournier's or other major infectious process in the groin photos in the media file,   he has got an ulcer to the right heel that has fairly clean wound margins is no major redness but there is warmth and redness to his right anterior shin  Neurological:     General: No focal deficit present.     Mental Status: He is alert and oriented to person, place, and time.     Cranial Nerves: No cranial nerve deficit.     Sensory: No sensory deficit.     Motor: No weakness.     Coordination: Coordination normal.  Psychiatric:        Mood and Affect: Mood normal.     (all labs ordered are listed, but only abnormal results are displayed) Labs Reviewed  COMPREHENSIVE METABOLIC PANEL WITH GFR - Abnormal; Notable for the following components:      Result Value   Glucose, Bld 200 (*)    BUN 34 (*)    Creatinine, Ser 1.58 (*)    GFR, Estimated 45 (*)    All other components  within normal limits  CBC WITH DIFFERENTIAL/PLATELET - Abnormal; Notable for the following components:   WBC 15.2 (*)    RBC 4.05 (*)    Hemoglobin 12.5 (*)    HCT 36.6 (*)    Neutro Abs 13.7 (*)    Lymphs Abs 0.6 (*)    All other components within normal limits  RESP PANEL BY RT-PCR (RSV, FLU A&B, COVID)  [id number]  CULTURE, BLOOD (ROUTINE  X 2)  CULTURE, BLOOD (ROUTINE X 2)  LACTIC ACID, PLASMA  PROTIME-INR  LACTIC ACID, PLASMA  URINALYSIS, W/ REFLEX TO CULTURE (INFECTION SUSPECTED)    EKG: EKG Interpretation Date/Time:  Saturday December 09 2023 22:58:13 EST Ventricular Rate:  111 PR Interval:  193 QRS Duration:  116 QT Interval:  329 QTC Calculation: 447 R Axis:   -60  Text Interpretation: Sinus tachycardia Confirmed by Ruthe Cornet [phone]) on 12/09/2023 11:00:00 PM  Radiology: [facility] Tibia/Fibula Right Result Date: 12/09/2023 EXAM: XR TIBIA AND FIBULA, 2 VIEW(S) 12/09/2023 10:50:00 PM COMPARISON: None available. CLINICAL HISTORY: swelling, infection FINDINGS: BONES AND JOINTS: Tibial intramedullary nail with proximal and distal locking screw fixation intact. Healed tibial mid-diaphyseal fracture. Chondrocalcinosis of the knee. No malalignment. SOFT TISSUES: Diffuse subcutaneous edema. Vascular calcifications. IMPRESSION: 1. Healed tibial mid-diaphyseal fracture with intact intramedullary nail and locking screw fixation. 2. Diffuse subcutaneous edema. 3. Chondrocalcinosis of the knee. Electronically signed by: Dorethia Molt MD 12/09/2023 10:58 PM EST RP Workstation: [id number]   DG Foot Complete Right Result Date: 12/09/2023 EXAM: 3 OR MORE VIEW(S) XRAY OF THE _LATERALITY_ FOOT 12/09/2023 10:50:00 PM COMPARISON: 10/23/2023 CLINICAL HISTORY: Questionable sepsis - evaluate for abnormality FINDINGS: BONES AND JOINTS: Prior ankle arthrodesis with intact hardware. Remote fracture deformity of the base of the second proximal phalanx with secondary degenerative arthritis of the second MTP  joint. Talonavicular dislocation and midfoot osteoarthritis. SOFT TISSUES: Soft tissue ulcer posterior to the calcaneus. Diffuse soft tissue swelling. No subjacent osseous erosion to suggest osteomyelitis. Vascular calcifications noted. IMPRESSION: 1. Soft tissue ulcer posterior to the calcaneus with diffuse soft tissue swelling. No subjacent osseous erosion to suggest osteomyelitis. 2. Talonavicular dislocation and midfoot osteoarthritis. 3. 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 4. Prior ankle arthrodesis with intact visualized  hardware. Electronically signed by: Dorethia Molt MD 12/09/2023 10:57 PM EST RP Workstation: [id number]   DG Chest Port 1 View Result Date: 12/09/2023 EXAM: 1 VIEW(S) XRAY OF THE CHEST 12/09/2023 10:50:12 PM COMPARISON: 10/23/2023 CLINICAL HISTORY: Questionable sepsis - evaluate for abnormality FINDINGS: LINES, TUBES AND DEVICES: Surgical clips in lower neck. LUNGS AND PLEURA: Bibasilar atelectasis. No pleural effusion. No pneumothorax. HEART AND MEDIASTINUM: Cardiomegaly. Status post coronary artery bypass grafting. BONES AND SOFT TISSUES: No acute osseous abnormality. IMPRESSION: 1. No acute cardiopulmonary abnormality. 2. Cardiomegaly. 3. Status post coronary artery bypass grafting. Electronically signed by: Dorethia Molt MD 12/09/2023 10:55 PM EST RP Workstation: [id number]     .Critical Care  Performed by: Ruthe Cornet, DO Authorized by: Ruthe Cornet, DO   Critical care provider statement:    Critical care time (minutes):  35   Critical care was necessary to treat or prevent imminent or life-threatening deterioration of the following conditions:  Sepsis   Critical care was time spent personally by me on the following activities:  Development of treatment plan with patient or surrogate, blood draw for specimens, discussions with primary provider, evaluation of patient's  response to treatment, examination of patient, obtaining history from patient or surrogate, ordering and performing treatments and interventions, pulse oximetry and re-evaluation of patient's condition   I assumed direction of critical care for this patient from another provider in my specialty: no      Medications Ordered in the ED  sodium chloride  0.9 % bolus 1,000 mL (1,000 mLs Intravenous New Bag/Given 12/09/23 2232)  metroNIDAZOLE  (FLAGYL ) IVPB 500 mg (500 mg Intravenous New Bag/Given 12/09/23 2249)  vancomycin  (VANCOCIN ) IVPB  1000 mg/200 mL premix (1,000 mg Intravenous New Bag/Given 12/09/23 2247)    Followed by  vancomycin  (VANCOCIN ) IVPB 1000 mg/200 mL premix (has no administration in time range)  ceFEPIme  (MAXIPIME ) 2 g in sodium chloride  0.9 % 100 mL IVPB (0 g Intravenous Stopped 12/09/23 2308)                                    Medical Decision Making Amount and/or Complexity of Data Reviewed Labs: ordered. Radiology: ordered.  Risk Prescription drug management.   [name] arrives with fever and chills.  He has a fever 100.1 upon arrival here.  Was 102 at home took Tylenol  prior to coming here.  He is tachycardic in the 120s.  Sepsis workup initiated but patient overall looks well.  He has a history of diabetes.  He is on insulin .  He is not immunocompromise.  He is dealing with Charcot to foot on the right.  Just had podiatry shaved down part of his ulcer to his right heel.  His right lower leg has some warmth and redness little bit of swelling and but the ulcer on his right heel overall looks like there is clean wound margins.  He noticed some skin irritation to his right groin area.  In the media file this is placed there is some spots that look more dark and bruising and not really a classic cellulitis type picture.  There is no crepitus or any suggestion to think of Fournier's gangrene.  Overall somewhat unremarkable and GU area is overall unremarkable.  I took a building services engineer  and put in the media file of his groin as well as his right foot.  Will initiate broad-spectrum IV antibiotics given fever and tachycardia.  Will cover for may be diabetic foot infection.  Will get chest x-ray urinalysis x-ray of the right foot will give fluid bolus and reevaluate. Overall I do suspect some cellulitis process given the warmth and mild redness to the right shin area.  Patient does have leukocytosis of 15.  Given tachycardia and fever he does  meet sepsis protocol with source likely cellulitis.   Remaining lab work shows normal lactic acid.  Blood sugar is 200.  Creatinine is mildly elevated 1.58.  X-rays do not show any evidence of osteomyelitis.  Chest x-ray negative for pneumonia.  There is subcutaneous soft tissue swelling around calcaneus site in the shin which I suspect is infectious.  He still mildly tachycardic.  I do think he benefit from further observation for IV antibiotics and further infectious workup.     This chart was dictated using voice recognition software.  Despite best efforts to proofread,  errors can occur which can change the documentation meaning.      Final diagnoses:  Fever, unspecified fever cause  Cellulitis, unspecified cellulitis site  Sepsis, due to unspecified organism, unspecified whether acute organ dysfunction [facility])    ED Discharge Orders     None          Ruthe Cornet, DO 12/09/23 2243    Ruthe Cornet, DO 12/09/23 2245    Ruthe, Shavelle Runkel, DO 12/09/23 2246    Ruthe Cornet, DO 12/09/23 2310    Ruthe Cornet, DO 12/09/23 2313

## 2023-12-09 NOTE — ED Triage Notes (Signed)
  Patient comes in with rash and fever that started earlier today.  Patient states he noticed a rash in his groin this morning and put some silvadene cream on it.  Started having chills/fever around 2100.  Took 1000 mg tylenol  around 2100.  Tmax 102.  Denies any respiratory issues.  Denies any urinary symptoms.  Has charcot foot on R foot.  Denies any pain at this time.

## 2023-12-10 ENCOUNTER — Inpatient Hospital Stay (HOSPITAL_COMMUNITY)

## 2023-12-10 DIAGNOSIS — E1051 Type 1 diabetes mellitus with diabetic peripheral angiopathy without gangrene: Secondary | ICD-10-CM | POA: Diagnosis present

## 2023-12-10 DIAGNOSIS — E10621 Type 1 diabetes mellitus with foot ulcer: Secondary | ICD-10-CM | POA: Diagnosis present

## 2023-12-10 DIAGNOSIS — A419 Sepsis, unspecified organism: Secondary | ICD-10-CM | POA: Diagnosis present

## 2023-12-10 DIAGNOSIS — N179 Acute kidney failure, unspecified: Secondary | ICD-10-CM | POA: Diagnosis present

## 2023-12-10 DIAGNOSIS — Z1152 Encounter for screening for COVID-19: Secondary | ICD-10-CM | POA: Diagnosis not present

## 2023-12-10 DIAGNOSIS — I2510 Atherosclerotic heart disease of native coronary artery without angina pectoris: Secondary | ICD-10-CM | POA: Diagnosis present

## 2023-12-10 DIAGNOSIS — L97419 Non-pressure chronic ulcer of right heel and midfoot with unspecified severity: Secondary | ICD-10-CM | POA: Diagnosis present

## 2023-12-10 DIAGNOSIS — I5032 Chronic diastolic (congestive) heart failure: Secondary | ICD-10-CM | POA: Diagnosis present

## 2023-12-10 DIAGNOSIS — I129 Hypertensive chronic kidney disease with stage 1 through stage 4 chronic kidney disease, or unspecified chronic kidney disease: Secondary | ICD-10-CM | POA: Diagnosis not present

## 2023-12-10 DIAGNOSIS — M7989 Other specified soft tissue disorders: Secondary | ICD-10-CM | POA: Diagnosis not present

## 2023-12-10 DIAGNOSIS — E890 Postprocedural hypothyroidism: Secondary | ICD-10-CM | POA: Diagnosis present

## 2023-12-10 DIAGNOSIS — I130 Hypertensive heart and chronic kidney disease with heart failure and stage 1 through stage 4 chronic kidney disease, or unspecified chronic kidney disease: Secondary | ICD-10-CM | POA: Diagnosis present

## 2023-12-10 DIAGNOSIS — R600 Localized edema: Secondary | ICD-10-CM | POA: Diagnosis not present

## 2023-12-10 DIAGNOSIS — I350 Nonrheumatic aortic (valve) stenosis: Secondary | ICD-10-CM | POA: Diagnosis present

## 2023-12-10 DIAGNOSIS — L03115 Cellulitis of right lower limb: Secondary | ICD-10-CM | POA: Diagnosis present

## 2023-12-10 DIAGNOSIS — E1042 Type 1 diabetes mellitus with diabetic polyneuropathy: Secondary | ICD-10-CM | POA: Diagnosis present

## 2023-12-10 DIAGNOSIS — N1831 Chronic kidney disease, stage 3a: Secondary | ICD-10-CM | POA: Diagnosis present

## 2023-12-10 DIAGNOSIS — B9620 Unspecified Escherichia coli [E. coli] as the cause of diseases classified elsewhere: Secondary | ICD-10-CM | POA: Diagnosis present

## 2023-12-10 DIAGNOSIS — E785 Hyperlipidemia, unspecified: Secondary | ICD-10-CM | POA: Diagnosis present

## 2023-12-10 DIAGNOSIS — D631 Anemia in chronic kidney disease: Secondary | ICD-10-CM | POA: Diagnosis present

## 2023-12-10 DIAGNOSIS — Z6830 Body mass index (BMI) 30.0-30.9, adult: Secondary | ICD-10-CM | POA: Diagnosis not present

## 2023-12-10 DIAGNOSIS — E10319 Type 1 diabetes mellitus with unspecified diabetic retinopathy without macular edema: Secondary | ICD-10-CM | POA: Diagnosis present

## 2023-12-10 DIAGNOSIS — E669 Obesity, unspecified: Secondary | ICD-10-CM | POA: Diagnosis present

## 2023-12-10 DIAGNOSIS — G4733 Obstructive sleep apnea (adult) (pediatric): Secondary | ICD-10-CM | POA: Diagnosis present

## 2023-12-10 DIAGNOSIS — E1022 Type 1 diabetes mellitus with diabetic chronic kidney disease: Secondary | ICD-10-CM | POA: Diagnosis present

## 2023-12-10 DIAGNOSIS — R5381 Other malaise: Secondary | ICD-10-CM | POA: Diagnosis present

## 2023-12-10 DIAGNOSIS — M14671 Charcot's joint, right ankle and foot: Secondary | ICD-10-CM | POA: Diagnosis present

## 2023-12-10 DIAGNOSIS — K219 Gastro-esophageal reflux disease without esophagitis: Secondary | ICD-10-CM | POA: Diagnosis present

## 2023-12-10 LAB — URINALYSIS, W/ REFLEX TO CULTURE (INFECTION SUSPECTED)
Bacteria, UA: NONE SEEN
Bilirubin Urine: NEGATIVE
Glucose, UA: 100 mg/dL — AB
Hgb urine dipstick: NEGATIVE
Ketones, ur: NEGATIVE mg/dL
Leukocytes,Ua: NEGATIVE
Nitrite: NEGATIVE
Protein, ur: NEGATIVE mg/dL
Specific Gravity, Urine: 1.019 (ref 1.005–1.030)
pH: 5 (ref 5.0–8.0)

## 2023-12-10 LAB — BLOOD CULTURE ID PANEL (REFLEXED) - BCID2

## 2023-12-10 LAB — BASIC METABOLIC PANEL WITH GFR
Anion gap: 9 (ref 5–15)
BUN: 34 mg/dL — ABNORMAL HIGH (ref 8–23)
CO2: 23 mmol/L (ref 22–32)
Calcium: 7.9 mg/dL — ABNORMAL LOW (ref 8.9–10.3)
Chloride: 104 mmol/L (ref 98–111)
Creatinine, Ser: 1.32 mg/dL — ABNORMAL HIGH (ref 0.61–1.24)
GFR, Estimated: 56 mL/min — ABNORMAL LOW (ref >60)
Glucose, Bld: 178 mg/dL — ABNORMAL HIGH (ref 70–99)
Potassium: 4.5 mmol/L (ref 3.5–5.1)
Sodium: 136 mmol/L (ref 135–145)

## 2023-12-10 LAB — CBC
HCT: 33.3 % — ABNORMAL LOW (ref 39.0–52.0)
Hemoglobin: 11.2 g/dL — ABNORMAL LOW (ref 13.0–17.0)
MCH: 31 pg (ref 26.0–34.0)
MCHC: 33.6 g/dL (ref 30.0–36.0)
MCV: 92.2 fL (ref 80.0–100.0)
Platelets: 177 K/uL (ref 150–400)
RBC: 3.61 MIL/uL — ABNORMAL LOW (ref 4.22–5.81)
RDW: 12.9 % (ref 11.5–15.5)
WBC: 18.6 K/uL — ABNORMAL HIGH (ref 4.0–10.5)
nRBC: 0 % (ref 0.0–0.2)

## 2023-12-10 LAB — GLUCOSE, CAPILLARY
Glucose-Capillary: 197 mg/dL — ABNORMAL HIGH (ref 70–99)
Glucose-Capillary: 170 mg/dL — ABNORMAL HIGH (ref 70–99)
Glucose-Capillary: 199 mg/dL — ABNORMAL HIGH (ref 70–99)
Glucose-Capillary: 179 mg/dL — ABNORMAL HIGH (ref 70–99)
Glucose-Capillary: 266 mg/dL — ABNORMAL HIGH (ref 70–99)

## 2023-12-10 LAB — LACTIC ACID, PLASMA: Lactic Acid, Venous: 1.6 mmol/L (ref 0.5–1.9)

## 2023-12-10 LAB — PRO BRAIN NATRIURETIC PEPTIDE: Pro Brain Natriuretic Peptide: 656 pg/mL — ABNORMAL HIGH (ref <300.0)

## 2023-12-10 MED ORDER — PHENOL 1.4 % MT LIQD
1.0000 | OROMUCOSAL | Status: DC | PRN
  Administered 2023-12-10: 1 via OROMUCOSAL
  Filled 2023-12-10 (×2): qty 177

## 2023-12-10 MED ORDER — SODIUM CHLORIDE 0.9 % IV SOLN
2.0000 g | Freq: Three times a day (TID) | INTRAVENOUS | Status: DC
  Administered 2023-12-10 (×2): 2 g via INTRAVENOUS
  Filled 2023-12-10: qty 12.5

## 2023-12-10 MED ORDER — ASPIRIN 81 MG PO TBEC
81.0000 mg | DELAYED_RELEASE_TABLET | Freq: Every evening | ORAL | Status: DC
  Administered 2023-12-10 – 2023-12-14 (×5): 81 mg via ORAL
  Filled 2023-12-10 (×6): qty 1

## 2023-12-10 MED ORDER — METRONIDAZOLE 500 MG/100ML IV SOLN
500.0000 mg | Freq: Two times a day (BID) | INTRAVENOUS | Status: DC
  Administered 2023-12-10: 500 mg via INTRAVENOUS
  Filled 2023-12-10: qty 100

## 2023-12-10 MED ORDER — INSULIN PUMP
Freq: Three times a day (TID) | SUBCUTANEOUS | Status: DC
  Administered 2023-12-11: 4.86 via SUBCUTANEOUS
  Administered 2023-12-11: 5 via SUBCUTANEOUS
  Administered 2023-12-11: 0.86 via SUBCUTANEOUS
  Filled 2023-12-10: qty 1

## 2023-12-10 MED ORDER — VANCOMYCIN HCL 1500 MG/300ML IV SOLN
1500.0000 mg | INTRAVENOUS | Status: DC
  Administered 2023-12-11: 1500 mg via INTRAVENOUS
  Filled 2023-12-10: qty 300

## 2023-12-10 MED ORDER — SODIUM CHLORIDE 0.9 % IV SOLN
2.0000 g | Freq: Two times a day (BID) | INTRAVENOUS | Status: DC
  Filled 2023-12-10: qty 12.5

## 2023-12-10 MED ORDER — TAMSULOSIN HCL 0.4 MG PO CAPS
0.4000 mg | ORAL_CAPSULE | Freq: Every evening | ORAL | Status: DC
  Administered 2023-12-10 – 2023-12-14 (×5): 0.4 mg via ORAL
  Filled 2023-12-10 (×6): qty 1

## 2023-12-10 MED ORDER — OMEPRAZOLE MAGNESIUM 20 MG PO TBEC: 20.0000 mg | DELAYED_RELEASE_TABLET | Freq: Every evening | ORAL | Status: DC

## 2023-12-10 MED ORDER — ENOXAPARIN SODIUM 40 MG/0.4ML IJ SOSY
40.0000 mg | PREFILLED_SYRINGE | INTRAMUSCULAR | Status: DC
  Administered 2023-12-10 – 2023-12-12 (×3): 40 mg via SUBCUTANEOUS
  Filled 2023-12-10 (×3): qty 0.4

## 2023-12-10 MED ORDER — LEVOTHYROXINE SODIUM 75 MCG PO TABS
150.0000 ug | ORAL_TABLET | Freq: Every day | ORAL | Status: DC
  Administered 2023-12-10 – 2023-12-12 (×3): 150 ug via ORAL
  Filled 2023-12-10 (×3): qty 2

## 2023-12-10 MED ORDER — CLOPIDOGREL BISULFATE 75 MG PO TABS
75.0000 mg | ORAL_TABLET | Freq: Every evening | ORAL | Status: DC
  Administered 2023-12-10 – 2023-12-14 (×5): 75 mg via ORAL
  Filled 2023-12-10 (×6): qty 1

## 2023-12-10 MED ORDER — ACETAMINOPHEN 650 MG RE SUPP: 650.0000 mg | Freq: Four times a day (QID) | RECTAL | Status: DC | PRN

## 2023-12-10 MED ORDER — PANTOPRAZOLE SODIUM 40 MG PO TBEC
40.0000 mg | DELAYED_RELEASE_TABLET | Freq: Every day | ORAL | Status: DC
  Administered 2023-12-10 – 2023-12-14 (×5): 40 mg via ORAL
  Filled 2023-12-10 (×6): qty 1

## 2023-12-10 MED ORDER — SODIUM CHLORIDE 0.9 % IV SOLN: INTRAVENOUS | Status: DC

## 2023-12-10 MED ORDER — ACETAMINOPHEN 325 MG PO TABS
650.0000 mg | ORAL_TABLET | Freq: Four times a day (QID) | ORAL | Status: DC | PRN
  Administered 2023-12-10: 650 mg via ORAL
  Filled 2023-12-10: qty 2

## 2023-12-10 NOTE — Progress Notes (Signed)
   12/10/23 2217  BiPAP/CPAP/SIPAP  BiPAP/CPAP/SIPAP Pt Type Adult  BiPAP/CPAP/SIPAP Resmed (self administer)  Dentures removed? Not applicable  FiO2 (%) 21 %  Patient Home Machine Yes  Safety Check Completed by RT for Home Unit Yes, no issues noted  Patient Home Mask Yes  Patient Home Tubing Yes  Auto Titrate Yes  Minimum cmH2O 5 cmH2O  Maximum cmH2O 20 cmH2O  CPAP/SIPAP surface wiped down Yes  Device Plugged into RED Power Outlet Yes  BiPAP/CPAP /SiPAP Vitals  Resp 20  Bilateral Breath Sounds Diminished  MEWS Score/Color  MEWS Score 0  MEWS Score Color Green

## 2023-12-10 NOTE — Plan of Care (Signed)
Discuss and review plan of care with patient/family  

## 2023-12-10 NOTE — Progress Notes (Signed)
 PHARMACY - PHYSICIAN COMMUNICATION CRITICAL VALUE ALERT - BLOOD CULTURE IDENTIFICATION (BCID)  [name] is an 73 y.o. male who presented to [facility] on 12/09/2023 with a chief complaint of rash in groin and fever  Assessment:   Admit with RLE cellulitis, imaging negative for osteo & new rash in groin area.  Blood cx + GNR, BCID + Ecoli, no resistance.   Name of physician (or Provider) Contacted: JINNY Kipper, NP  Current antibiotics: Cefepime  + Vanc + Flagyl   Changes to prescribed antibiotics recommended:  De-escalate to Rocephin  2gm IV q24h Recommendations accepted by provider  Results for orders placed or performed during the hospital encounter of 12/09/23  Blood Culture ID Panel (Reflexed) (Collected: 12/09/2023 10:25 PM)  Result Value Ref Range   Enterococcus faecalis NOT DETECTED NOT DETECTED   Enterococcus Faecium NOT DETECTED NOT DETECTED   Listeria monocytogenes NOT DETECTED NOT DETECTED   Staphylococcus species NOT DETECTED NOT DETECTED   Staphylococcus aureus (BCID) NOT DETECTED NOT DETECTED   Staphylococcus epidermidis NOT DETECTED NOT DETECTED   Staphylococcus lugdunensis NOT DETECTED NOT DETECTED   Streptococcus species NOT DETECTED NOT DETECTED   Streptococcus agalactiae NOT DETECTED NOT DETECTED   Streptococcus pneumoniae NOT DETECTED NOT DETECTED   Streptococcus pyogenes NOT DETECTED NOT DETECTED   A.calcoaceticus-baumannii NOT DETECTED NOT DETECTED   Bacteroides fragilis NOT DETECTED NOT DETECTED   Enterobacterales DETECTED (A) NOT DETECTED   Enterobacter cloacae complex NOT DETECTED NOT DETECTED   Escherichia coli DETECTED (A) NOT DETECTED   Klebsiella aerogenes NOT DETECTED NOT DETECTED   Klebsiella oxytoca NOT DETECTED NOT DETECTED   Klebsiella pneumoniae NOT DETECTED NOT DETECTED   Proteus species NOT DETECTED NOT DETECTED   Salmonella species NOT DETECTED NOT DETECTED   Serratia marcescens NOT DETECTED NOT DETECTED   Haemophilus influenzae NOT  DETECTED NOT DETECTED   Neisseria meningitidis NOT DETECTED NOT DETECTED   Pseudomonas aeruginosa NOT DETECTED NOT DETECTED   Stenotrophomonas maltophilia NOT DETECTED NOT DETECTED   Candida albicans NOT DETECTED NOT DETECTED   Candida auris NOT DETECTED NOT DETECTED   Candida glabrata NOT DETECTED NOT DETECTED   Candida krusei NOT DETECTED NOT DETECTED   Candida parapsilosis NOT DETECTED NOT DETECTED   Candida tropicalis NOT DETECTED NOT DETECTED   Cryptococcus neoformans/gattii NOT DETECTED NOT DETECTED   CTX-M ESBL NOT DETECTED NOT DETECTED   Carbapenem resistance IMP NOT DETECTED NOT DETECTED   Carbapenem resistance KPC NOT DETECTED NOT DETECTED   Carbapenem resistance NDM NOT DETECTED NOT DETECTED   Carbapenem resist OXA 48 LIKE NOT DETECTED NOT DETECTED   Carbapenem resistance VIM NOT DETECTED NOT DETECTED    Rosaline Millet PharmD 12/10/2023  11:24 PM

## 2023-12-10 NOTE — Progress Notes (Signed)
 BG taken at 1618: 179 mg/dL Patient administered 3 U through Insulin  pump and had 45 of Carbs.

## 2023-12-10 NOTE — Progress Notes (Signed)
 VASCULAR LAB    Right lower extremity venous duplex has been performed.  See CV proc for preliminary results.   Jream Broyles, RVT 12/10/2023, 10:05 AM

## 2023-12-10 NOTE — Progress Notes (Incomplete)
 PHARMACY - PHYSICIAN COMMUNICATION CRITICAL VALUE ALERT - BLOOD CULTURE IDENTIFICATION (BCID)  [name] is an 73 y.o. male who presented to [facility] on 12/09/2023 with a chief complaint of rash in groin and fever  Assessment:   Admit with RLE cellulitis, imaging negative for osteo & new rash in groin area.  Blood cx + GNR, BCID + Ecoli, no resistance.   Name of physician (or Provider) Contacted: JINNY Kipper, NP  Current antibiotics: Cefepime  + Vanc + Flagyl   Changes to prescribed antibiotics recommended:  {Plan:21268}  Results for orders placed or performed during the hospital encounter of 12/09/23  Blood Culture ID Panel (Reflexed) (Collected: 12/09/2023 10:25 PM)  Result Value Ref Range   Enterococcus faecalis NOT DETECTED NOT DETECTED   Enterococcus Faecium NOT DETECTED NOT DETECTED   Listeria monocytogenes NOT DETECTED NOT DETECTED   Staphylococcus species NOT DETECTED NOT DETECTED   Staphylococcus aureus (BCID) NOT DETECTED NOT DETECTED   Staphylococcus epidermidis NOT DETECTED NOT DETECTED   Staphylococcus lugdunensis NOT DETECTED NOT DETECTED   Streptococcus species NOT DETECTED NOT DETECTED   Streptococcus agalactiae NOT DETECTED NOT DETECTED   Streptococcus pneumoniae NOT DETECTED NOT DETECTED   Streptococcus pyogenes NOT DETECTED NOT DETECTED   A.calcoaceticus-baumannii NOT DETECTED NOT DETECTED   Bacteroides fragilis NOT DETECTED NOT DETECTED   Enterobacterales DETECTED (A) NOT DETECTED   Enterobacter cloacae complex NOT DETECTED NOT DETECTED   Escherichia coli DETECTED (A) NOT DETECTED   Klebsiella aerogenes NOT DETECTED NOT DETECTED   Klebsiella oxytoca NOT DETECTED NOT DETECTED   Klebsiella pneumoniae NOT DETECTED NOT DETECTED   Proteus species NOT DETECTED NOT DETECTED   Salmonella species NOT DETECTED NOT DETECTED   Serratia marcescens NOT DETECTED NOT DETECTED   Haemophilus influenzae NOT DETECTED NOT DETECTED   Neisseria meningitidis NOT DETECTED NOT  DETECTED   Pseudomonas aeruginosa NOT DETECTED NOT DETECTED   Stenotrophomonas maltophilia NOT DETECTED NOT DETECTED   Candida albicans NOT DETECTED NOT DETECTED   Candida auris NOT DETECTED NOT DETECTED   Candida glabrata NOT DETECTED NOT DETECTED   Candida krusei NOT DETECTED NOT DETECTED   Candida parapsilosis NOT DETECTED NOT DETECTED   Candida tropicalis NOT DETECTED NOT DETECTED   Cryptococcus neoformans/gattii NOT DETECTED NOT DETECTED   CTX-M ESBL NOT DETECTED NOT DETECTED   Carbapenem resistance IMP NOT DETECTED NOT DETECTED   Carbapenem resistance KPC NOT DETECTED NOT DETECTED   Carbapenem resistance NDM NOT DETECTED NOT DETECTED   Carbapenem resist OXA 48 LIKE NOT DETECTED NOT DETECTED   Carbapenem resistance VIM NOT DETECTED NOT DETECTED    Rosaline Millet PharmD 12/10/2023  11:24 PM

## 2023-12-10 NOTE — ED Notes (Signed)
 [facility] at bedside

## 2023-12-10 NOTE — H&P (Signed)
 History and Physical    [name] FMW:[id number] DOB: January 29, 1950 DOA: 12/09/2023  PCP: Darilyn Rosalva Bruckner, PA-C  Patient coming from: [facility] ED  Chief Complaint: Fever and chills  HPI: [name] is a 73 y.o. male with medical history significant of type 1 diabetes on insulin  pump, diabetic neuropathy and retinopathy, Charcot right foot, HFpEF, hypertension, hyperlipidemia, CKD stage IIIa, CAD status post CABG, aortic stenosis status post bioprosthetic aortic valve replacement in 2017, right carotid stenosis status post stenting, CVA, GERD, hypothyroidism, OSA on CPAP, BPH presenting with complaints of right lower extremity erythema and warmth.  Last admitted to [facility] health system 10/20-10/23/2025 for sepsis secondary to right lower extremity cellulitis.  Patient presents today with complaints of fever and chills since yesterday evening.  He reports chronic right lower extremity edema due to having history of previous foot surgery and denies any pain in his foot.  Denies chest pain, shortness of breath, or history of blood clots.  He is complaining of a rash in his groin region which does not itch.  Denies recent sexual activity.  Denies nausea, vomiting, abdominal pain, or diarrhea.  ED Course: Temperature 100.1 F, heart rate in the 120s.  Not hypotensive or hypoxic.  Labs notable for WBC count 15.2, hemoglobin 12.5 (stable), MCV 90.4, glucose 200, bicarb 25, BUN 34, creatinine 1.58 (baseline 1.1), lactic acid normal x 2, COVID/influenza/RSV PCR negative, UA not suggestive of infection, blood cultures in process.  X-rays of right foot/tibia/fibula negative for osteomyelitis.  Chest x-ray showing no acute cardiopulmonary abnormality.  Patient was given 1 L normal saline, vancomycin , cefepime , and metronidazole .  Review of Systems:  Review of Systems  All other systems reviewed and are negative.   Past Medical History:  Diagnosis Date   Aortic stenosis, moderate Sept/Oct /2013    2017-> s/p bioprosthetic AV [facility] Ease bovine pericardial valve model 3300 TFX, ser # [id number]).   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 Inferolateral perfusion defect + EKG abnormality during stress testing prompted Cath by [facility]: 3V CAD, EF normal.  Med mgmt recommended; b. 06/2015 Cath: 3VD and mod AS; c. 07/2015 CABG x 3 (LIMA->LAD, VG->OM2, VG->PDA) w/ AVR.   Carotid stenosis, right 09/2011   Dr. Court did R carotid stenting 01/2014.  Carotid dopplers 06/2015 essentially normal.  No change 07/2016--repeat 1 yr.   Complication of anesthesia    difficulty with intubation,    Decreased pedal pulses    LE doppler 11/08/11- no evidence of arterial insufficiency   Diabetes mellitus Dx'd age 56   Novolog  via insulin  pump; sub-optimal control x years   Diabetic neuropathy (HCC)    fine touch and position sense affected   Diabetic retinopathy    Proliferative: Hx of retinal detachment on right-light perception only in right eye   Diastolic dysfunction 2007; 2016   TEE with diastolic dysfunction, EF 74%.   Difficult intubation    ~ 2002 difficult fiberoptic intubation with takeback bleeding s/p thyroidectomy;  for thyroidectomy was intubated DL X 1 with cricoid pressure but difficult mask (full beard)   Erectile dysfunction    [place]   GERD (gastroesophageal reflux disease)    Heart murmur    History of tobacco abuse    Quit about 1990 (has 35 pack-yr hx)   Hyperlipemia, mixed    a. intolerant of statins/zeta; per Dr. Court 12/2016, pt started on PCSK9--incomplete responder?---01/2017.   Hypothyroidism, postsurgical    Thyroidectomy 2002; multinodular goiter.  Dr.  Gherghe managing this as of 10/2015.   Impingement syndrome of right shoulder    08/26/15 Pt got subacromial steroid injection by orthopedist (Dr. Norleen Centers).  [facility] f/u 01/2016--MRI showed RC tear + AC joint arthritis, arthroscopic surgery done 03/2016 ([facility]).   OSA (obstructive sleep apnea)     06/2011 sleep study: moderate OSA, CPAP at 12 cm H2O.   Osteoarthritis    Bilat thumb carpometacarpal joints.  Ortho injected each thumb x 2 in 2017.  Also injected 03/14/17.   Recurrent pneumonia    TIA (transient ischemic attack) 05/2015   Left brain (right sided numbness + slurred speech)  Admitted for obs/workup 05/2015.   Venous reflux 10/14/2011   venous doppler-R GSV continuous reflux throughout; too small for VNUS closure    Past Surgical History:  Procedure Laterality Date   ABDOMINAL AORTOGRAM W/LOWER EXTREMITY N/A 10/27/2020   Procedure: ABDOMINAL AORTOGRAM W/LOWER EXTREMITY;  Surgeon: Lanis Fonda BRAVO, MD;  Location: [facility] INVASIVE CV LAB;  Service: Vascular;  Laterality: N/A;   ABI  01/2016   Normal.  Dr. Court to repeat in 1 yr.   AORTIC VALVE REPLACEMENT N/A 07/09/2015   Bovine pericardial valve.  Procedure: AORTIC VALVE REPLACEMENT (AVR);  Surgeon: Elspeth JAYSON Millers, MD;  Location: [facility] OR;  Service: Open Heart Surgery;  Laterality: N/A;   CARDIAC CATHETERIZATION  10/2011   EF normal.  Diffuse 3 vessel CAD, no stents placed.  Medical mgmt per [facility].   CARDIAC CATHETERIZATION N/A 06/22/2015   Procedure: Right/Left Heart Cath and Coronary Angiography;  Surgeon: Dorn JINNY Court, MD;  Location: [facility] INVASIVE CV LAB;  Service: Cardiovascular;  Laterality: N/A;   carotid dopplers  06/30/15   Normal: repeat when clinically indicated per Dr. Court   CAROTID STENT INSERTION Right 01/16/2014   Procedure: CAROTID STENT INSERTION;  Surgeon: Dorn JINNY Court, MD;  Location: [facility] CATH LAB;  Service: Cardiovascular;  Laterality: Right;   CATARACT EXTRACTION     left   CORONARY ARTERY BYPASS GRAFT N/A 07/09/2015   Procedure: CORONARY ARTERY BYPASS GRAFTING (CABG)TIMES THREE USING LEFT INTERNAL MAMMARY ARTERY AND LEFT SAPHENOUS LEG VEIN HARVESTED ENDOSCOPICALLY;  Surgeon: Elspeth JAYSON Millers, MD;  Location: [facility] OR;  Service: Open Heart Surgery;  Laterality: N/A;   EYE SURGERY     Multiple laser  surgeries for diabetic retinopathy, also cataract surgery OU.   HARDWARE REMOVAL Right 05/28/2020   Procedure: HARDWARE REMOVAL AND EXCHANGE RIGHT ANKLE;  Surgeon: Elsa Lonni SAUNDERS, MD;  Location: [facility] OR;  Service: Orthopedics;  Laterality: Right;   INTRAOPERATIVE TRANSESOPHAGEAL ECHOCARDIOGRAM N/A 07/09/2015   Procedure: INTRAOPERATIVE TRANSESOPHAGEAL ECHOCARDIOGRAM;  Surgeon: Elspeth JAYSON Millers, MD;  Location: [facility] OR;  Service: Open Heart Surgery;  Laterality: N/A;   lasix  eye     LEFT AND RIGHT HEART CATHETERIZATION WITH CORONARY ANGIOGRAM N/A 10/28/2011   Procedure: LEFT AND R0IHT HEART CATHETERIZATION WITH CORONARY ANGIOGRAM;  Surgeon: Debby DELENA Sor, MD;  Location: [facility] CATH LAB;  Service: Cardiovascular;  Laterality: N/A;   ORIF ANKLE FRACTURE Right 07/23/2019   Procedure: OPEN REDUCTION RIGHT ANKLE DISLOCATION WITH REPAIR, OPEN TREATMENT OF TIBIAL PLAFOND FRACTURE WITH LATERAL MALLEOLUS, OPEN TREATMENT OF SYNDESMOSIS;  Surgeon: Elsa Lonni SAUNDERS, MD;  Location: [facility] OR;  Service: Orthopedics;  Laterality: Right;  LENGTH OF SURGERY: 2.5 HOURS   RETINAL DETACHMENT SURGERY     Right eye   ROTATOR CUFF REPAIR W/ DISTAL CLAVICLE EXCISION Right 03/2016   Arthroscopic ([facility])   THYROID  SURGERY  2002   TIBIA  IM NAIL INSERTION Right 05/28/2020   Procedure: INTRAMEDULLARY NAIL RIGHT TIBIA;  Surgeon: Elsa Lonni SAUNDERS, MD;  Location: [facility] OR;  Service: Orthopedics;  Laterality: Right;   TRANSTHORACIC ECHOCARDIOGRAM  10/2011;12/2012;01/2015; 08/2015; 07/28/16   Mod AS, mild LVH, EF 60-65%, no RWMA.  01/2015 EF 60-65%, grade I DD, progression of mod/sev AS.  08/2015 normal lv fxn with normal fxning bioprosthetic Ao valve.  07/2016--no change compared to 2017 echo.       reports that he quit smoking about 42 years ago. His smoking use included cigarettes. He has never used smokeless tobacco. He reports that he does not currently use alcohol. He reports that he does not use drugs.  Allergies   Allergen Reactions   Crestor  [Rosuvastatin  Calcium ] Other (See Comments)    Myalgias Lethargy   Statins Other (See Comments)    Problem with higher dosages (Lipitor caused memory issues) Myalgias Lethargy   Lipitor [Atorvastatin ] Other (See Comments)    Myalgais  Lethargy  Memory issues, brain fog   Tape Rash and Other (See Comments)    Adhesive tape.  (Paper tape OK)    Family History  Problem Relation Age of Onset   Cancer Mother        lung cancer   Alcohol abuse Father    Cancer Father        laryngeal cancer   Cancer Brother        oldest brother had lung cancer and melanoma    Prior to Admission medications   Medication Sig Start Date End Date Taking? Authorizing Provider  acetaminophen  (TYLENOL ) 500 MG tablet Take 1,000 mg by mouth 2 (two) times daily as needed for headache, fever or moderate pain (pain score 4-6).    [provider]  aspirin  EC 81 MG tablet Take 81 mg by mouth every evening.    [provider]  cetirizine (ZYRTEC) 10 MG tablet Take 10 mg by mouth every evening.    [provider]  clopidogrel  (PLAVIX ) 75 MG tablet Take 1 tablet (75 mg total) by mouth daily at 12 noon. 07/07/22   Barbarann Nest, MD  clotrimazole -betamethasone  (LOTRISONE ) cream Apply 1 Application topically 2 (two) times daily as needed (skin irritation). 03/08/23   [provider]  collagenase  (SANTYL ) 250 UNIT/GM ointment Apply topically daily. Apply Santyl  to right heel and right shin wounds every day, then cover with moist 2X2 gauze and foam dressing. Change foam dressing every 3 days or as needed for soiling 10/27/23   Sebastian Toribio GAILS, MD  Evolocumab  (REPATHA  SURECLICK) 140 MG/ML SOAJ INJECT 140 MG INTO THE SKIN EVERY 14 (FOURTEEN) DAYS. 08/22/23   Hilty, Vinie BROCKS, MD  hydrochlorothiazide (HYDRODIURIL) 12.5 MG tablet Take 12.5 mg by mouth daily.    [provider]  insulin  lispro (HUMALOG ) 100 UNIT/ML injection Use 60 units via insulin   pump. Patient taking differently: Inject 100 Units into the skin See admin instructions. Max daily dose 100 units via insulin  pump. Continuous. 08/01/16   Trixie File, MD  levothyroxine  (SYNTHROID ) 150 MCG tablet Take 150 mcg by mouth daily before breakfast. 08/19/21   [provider]  loperamide  (IMODIUM  A-D) 2 MG tablet Take 2 mg by mouth daily as needed for diarrhea or loose stools.    [provider]  losartan  (COZAAR ) 25 MG tablet Take 25 mg by mouth daily.    [provider]  Multiple Vitamins-Minerals (ONE-A-DAY MENS 50+) TABS Take 1 tablet by mouth every evening.    [provider]  omeprazole  (PRILOSEC  OTC) 20 MG tablet Take 20 mg by mouth every evening.    [provider]  ondansetron  (ZOFRAN ) 4 MG tablet Take 1 tablet (4 mg total) by mouth every 6 (six) hours as needed for nausea. 10/26/23   Sebastian Toribio GAILS, MD  tamsulosin  (FLOMAX ) 0.4 MG CAPS capsule Take 0.4 mg by mouth every evening. 07/07/21   [provider]    Physical Exam: Vitals:   12/09/23 2206 12/09/23 2337 12/10/23 0000 12/10/23 0117  BP:  (!) 149/66 (!) 120/58 135/82  Pulse:  (!) 108  (!) 121  Resp:  (!) 21 17 17   Temp:  99.6 F (37.6 C)  99.3 F (37.4 C)  TempSrc:  Oral  Oral  SpO2:  94%  98%  Weight: 108 kg     Height: 6' 2 (1.88 m)       Physical Exam Vitals reviewed. Exam conducted with a chaperone present Community Education Officer JINNY Pinal).  Constitutional:      General: He is not in acute distress. HENT:     Head: Normocephalic and atraumatic.  Eyes:     Extraocular Movements: Extraocular movements intact.  Cardiovascular:     Rate and Rhythm: Regular rhythm. Tachycardia present.     Pulses: Normal pulses.  Pulmonary:     Effort: Pulmonary effort is normal. No respiratory distress.     Breath sounds: Normal breath sounds.  Abdominal:     General: Bowel sounds are normal.     Palpations: Abdomen is soft.     Tenderness: There is no abdominal  tenderness. There is no guarding.  Genitourinary:    Comments: Petechial rash noted on the mons pubis on the right side.  No crepitus or necrosis.  No signs of Fournier's gangrene. Musculoskeletal:     Cervical back: Normal range of motion.     Right lower leg: Edema present.     Left lower leg: Edema present.     Comments: R >>L lower extremity edema Right lower leg erythematous and warm to touch.  Dorsalis pedis pulse palpable and foot warm to touch.  Ulcer noted on the right heel with clean base and no purulent drainage.  Skin:    General: Skin is warm and dry.  Neurological:     General: No focal deficit present.     Mental Status: He is alert and oriented to person, place, and time.    Labs on Admission: I have personally reviewed following labs and imaging studies  CBC: Recent Labs  Lab 12/09/23 2227  WBC 15.2*  NEUTROABS 13.7*  HGB 12.5*  HCT 36.6*  MCV 90.4  PLT 204   Basic Metabolic Panel: Recent Labs  Lab 12/09/23 2227  NA 138  K 4.4  CL 102  CO2 25  GLUCOSE 200*  BUN 34*  CREATININE 1.58*  CALCIUM  9.0   GFR: Estimated Creatinine Clearance: 52.9 mL/min (A) (by C-G formula based on SCr of 1.58 mg/dL (H)). Liver Function Tests: Recent Labs  Lab 12/09/23 2227  AST 28  ALT 17  ALKPHOS 116  BILITOT 0.5  PROT 7.4  ALBUMIN  4.1   No results for input(s): LIPASE, AMYLASE in the last 168 hours. No results for input(s): AMMONIA in the last 168 hours. Coagulation Profile: Recent Labs  Lab 12/09/23 2227  INR 1.0   Cardiac Enzymes: No results for input(s): CKTOTAL, CKMB, CKMBINDEX, TROPONINI in the last 168 hours. BNP (last 3 results) No results for input(s): PROBNP in the last 8760 hours. HbA1C:  No results for input(s): HGBA1C in the last 72 hours. CBG: No results for input(s): GLUCAP in the last 168 hours. Lipid Profile: No results for input(s): CHOL, HDL, LDLCALC, TRIG, CHOLHDL, LDLDIRECT in the last 72  hours. Thyroid  Function Tests: No results for input(s): TSH, T4TOTAL, FREET4, T3FREE, THYROIDAB in the last 72 hours. Anemia Panel: No results for input(s): VITAMINB12, FOLATE, FERRITIN, TIBC, IRON, RETICCTPCT in the last 72 hours. Urine analysis:    Component Value Date/Time   COLORURINE YELLOW 12/09/2023 2227   APPEARANCEUR CLEAR 12/09/2023 2227   LABSPEC 1.019 12/09/2023 2227   PHURINE 5.0 12/09/2023 2227   GLUCOSEU 100 (A) 12/09/2023 2227   HGBUR NEGATIVE 12/09/2023 2227   BILIRUBINUR NEGATIVE 12/09/2023 2227   BILIRUBINUR n 01/12/2011 1323   KETONESUR NEGATIVE 12/09/2023 2227   PROTEINUR NEGATIVE 12/09/2023 2227   UROBILINOGEN 1.0 01/12/2014 1649   NITRITE NEGATIVE 12/09/2023 2227   LEUKOCYTESUR NEGATIVE 12/09/2023 2227    Radiological Exams on Admission: DG Tibia/Fibula Right Result Date: 12/09/2023 EXAM: XR TIBIA AND FIBULA, 2 VIEW(S) 12/09/2023 10:50:00 PM COMPARISON: None available. CLINICAL HISTORY: swelling, infection FINDINGS: BONES AND JOINTS: Tibial intramedullary nail with proximal and distal locking screw fixation intact. Healed tibial mid-diaphyseal fracture. Chondrocalcinosis of the knee. No malalignment. SOFT TISSUES: Diffuse subcutaneous edema. Vascular calcifications. IMPRESSION: 1. Healed tibial mid-diaphyseal fracture with intact intramedullary nail and locking screw fixation. 2. Diffuse subcutaneous edema. 3. Chondrocalcinosis of the knee. Electronically signed by: Dorethia Molt MD 12/09/2023 10:58 PM EST RP Workstation: [id number]   DG Foot Complete Right Result Date: 12/09/2023 EXAM: 3 OR MORE VIEW(S) XRAY OF THE _LATERALITY_ FOOT 12/09/2023 10:50:00 PM COMPARISON: 10/23/2023 CLINICAL HISTORY: Questionable sepsis - evaluate for abnormality FINDINGS: BONES AND JOINTS: Prior ankle arthrodesis with intact hardware. 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  Talonavicular dislocation and midfoot osteoarthritis. SOFT TISSUES: Soft tissue ulcer posterior to the calcaneus. Diffuse soft tissue swelling. No subjacent osseous erosion to suggest osteomyelitis. Vascular calcifications noted. IMPRESSION: 1. Soft tissue ulcer posterior to the calcaneus with diffuse soft tissue swelling. No subjacent osseous erosion to suggest osteomyelitis. 2. Talonavicular dislocation and midfoot osteoarthritis. 3. 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 4. Prior ankle arthrodesis with intact visualized  hardware. Electronically signed by: Dorethia Molt MD 12/09/2023 10:57 PM EST RP Workstation: [id number]   DG Chest Port 1 View Result Date: 12/09/2023 EXAM: 1 VIEW(S) XRAY OF THE CHEST 12/09/2023 10:50:12 PM COMPARISON: 10/23/2023 CLINICAL HISTORY: Questionable sepsis - evaluate for abnormality FINDINGS: LINES, TUBES AND DEVICES: Surgical clips in lower neck. LUNGS AND PLEURA: Bibasilar atelectasis. No pleural effusion. No pneumothorax. HEART AND MEDIASTINUM: Cardiomegaly. Status post coronary artery bypass grafting. BONES AND SOFT TISSUES: No acute osseous abnormality. IMPRESSION: 1. No acute cardiopulmonary abnormality. 2. Cardiomegaly. 3. Status post coronary artery bypass grafting. Electronically signed by: Dorethia Molt MD 12/09/2023 10:55 PM EST RP Workstation: [id number]    EKG: Independently reviewed.  Sinus tachycardia, no significant change since previous tracing.  Assessment and Plan  Sepsis secondary to right lower extremity cellulitis Presenting with fever, chills, tachycardia, leukocytosis, and signs of cellulitis of right lower leg.  X-rays of right foot/tibia/fibula negative for osteomyelitis.  He has a petechial rash on his mons pubis but no crepitus or necrosis.  No signs of Fournier's gangrene.  No other obvious infectious source.  Patient was given 1 L IV fluids in  the ED.   No lactic acidosis or hypotension.  Continue vancomycin , cefepime , metronidazole , and maintenance IV fluid.  Trend WBC count.  Follow-up blood cultures.  AKI on CKD stage IIIa Likely prerenal in etiology in the setting of sepsis.  BUN 34, creatinine 1.58 (baseline 1.1).  Continue gentle IV fluid hydration and monitor renal function.  Avoid nephrotoxic agents/hold home hydrochlorothiazide and losartan .  Chronic HFpEF Last echo done 10/11/2023 showing EF 60 to 65%, grade 2 diastolic dysfunction, mildly elevated pulmonary artery systolic pressure, mild left atrium dilation, mild mitral regurgitation, and mild stenosis of bioprosthetic aortic valve.  He has pitting edema of bilateral lower extremities on exam, R >>L.  No evidence of pulmonary edema on chest x-ray.  Check BNP.  Right lower extremity edema No dyspnea, chest pain, or hypoxemia to suggest PE.  Doppler ultrasound ordered to rule out DVT.  Type 1 diabetes on insulin  pump Glucose 200.  Last hemoglobin A1c 7.0 on 10/23/2023.  Patient wishes to continue using his insulin  pump while he is hospitalized.  Continue CBG checks before meals, at bedtime, and at 3 AM.  Hypertension Currently normotensive.  Holding hydrochlorothiazide and losartan  in the setting of sepsis and AKI as above.  Hyperlipidemia Receives Repatha  injection every 14 days, last dose unknown.  CAD status post CABG Not endorsing any anginal symptoms.  Continue aspirin  and Plavix .  History of CVA Continue aspirin  and Plavix .  GERD Continue omeprazole .  Hypothyroidism Continue Synthroid .  OSA Continue nightly CPAP.  BPH Continue Flomax .  Chronic normocytic anemia Hemoglobin stable, monitor CBC.  DVT prophylaxis: Lovenox  Code Status: Full Code (discussed with the patient) Level of care: Progressive Care Unit Admission status: It is my clinical opinion that admission to INPATIENT is reasonable and necessary because of the expectation that this patient will  require hospital care that crosses at least 2 midnights to treat this condition based on the medical complexity of the problems presented.  Given the aforementioned information, the predictability of an adverse outcome is felt to be significant.  Editha Ram MD [facility]  If 7PM-7AM, please contact night-coverage [email or web address]  12/10/2023, 1:23 AM

## 2023-12-10 NOTE — Progress Notes (Signed)
 Brief same day note:  Patient is a 73 year old male with history of type 1 diabetes on insulin  pump, diabetic neuropathy/retinopathy, charcoaled right foot, HFpEF, hypertension, hyperlipidemia, CKD stage III, CAD status post CABG, aortic stenosis status post bioprosthetic aortic valve replacement in 2017, right carotid stenosis status post stenting, CVA, GERD, hypothyroidism, OSA on CPAP, BPH who presented with right lower extremity warmth and erythema.  He was recently admitted for sepsis secondary to right lower extremity cellulitis.  Also had chills and fever at home.  Mentation, he was febrile, tachycardic but not hypotensive.  Lab work showed WC count of 15.2, creatinine of 1.5, lactic acid was normal.  Patient was admitted for the management of right lower extremity cellulitis.  Started on proximal antibiotics.  Cultures sent.  Patient seen and examined at bedside today.  Hemodynamically stable.  Afebrile this morning.  On room air.  Has erythema of the right lower extremity.  Has chronic edema of the right lower extremity.  Fine crackles auscultated on lung bases.  Fluids discontinued.  Assessment and plan:  Sepsis secondary to right lower EXTR cellulitis: Recently admitted for the same.  Presented with fever, chills, erythema, warmth of right lower extremity.  Has chronic right lower extremity edema.  Venous Doppler pending. x-ray of the right leg negative for osteomyelitis.  Continue empiric antibiotics.  Blood pressure currently stable.  AKI in CKD stage III: Baseline creatinine around 1.1.  Started on gentle IV fluid with improvement in the kidney function.  Will discontinue fluid.  Chronic HFpEF: Last echo on 10/11/2023 showed EF of 60 to 65%, grade 2 diastolic dysfunction.  Mild elevated BNP.  Appears mildly volume overloaded today.  Will discontinue fluid.  Type 1 diabetes: Recent A1c of 7.  Continue current insulin  regimen.  On insulin  pump at home.  Hypertension: Currently  normotensive.  Takes losartan , hydrochlorothiazide at home.  Currently on hold  Hyperlipidemia: On Repatha  injection  History of coronary artery disease/carotid stenosis/CVA: Status post CABG.  Had a stent for carotid artery stenosis.  Continue aspirin , Plavix   GERD: Continue PPI  Hypothyroidism: Continue Synthyroid  OSA: On CPAP at night  History of BPH: Continue Flomax .  Normocytic  anemia: Currently hemoglobin stable  Debility/deconditioning: Patient ambulates with the help of cane.  Has poor ambulatory function.  Will consult PT

## 2023-12-10 NOTE — Progress Notes (Signed)
 Pharmacy Antibiotic Note  [name] is a 73 y.o. male admitted on 12/09/2023 with RLL cellulitis.  Pharmacy has been consulted for Vancomycin  & Cefepime  dosing.  Plan: Cefepime  2gm IV q12h Vancomycin  1500mg  IV q24h for estimated AUC 445 (goal 400-550) Monitor renal function and cx data    Height: 6' 2 (188 cm) Weight: 108 kg (238 lb) IBW/kg (Calculated) : 82.2  Temp (24hrs), Avg:99.7 F (37.6 C), Min:99.3 F (37.4 C), Max:100.1 F (37.8 C)  Recent Labs  Lab 12/09/23 2227 12/09/23 2344  WBC 15.2*  --   CREATININE 1.58*  --   LATICACIDVEN 1.7 1.6    Estimated Creatinine Clearance: 52.9 mL/min (A) (by C-G formula based on SCr of 1.58 mg/dL (H)).    Allergies  Allergen Reactions   Crestor  [Rosuvastatin  Calcium ] Other (See Comments)    Myalgias Lethargy   Statins Other (See Comments)    Problem with higher dosages (Lipitor caused memory issues) Myalgias Lethargy   Lipitor [Atorvastatin ] Other (See Comments)    Myalgais  Lethargy  Memory issues, brain fog   Tape Rash and Other (See Comments)    Adhesive tape.  (Paper tape OK)    Antimicrobials this admission: 12/6 Cefepime  >>  12/6 Vancomycin  >>  12/6 Metronidazole  >>  Dose adjustments this admission:  Microbiology results: 12/6 BCx:  12/6 Resp PCR: negative  Thank you for allowing pharmacy to be a part of this patient's care.  Rosaline Millet PharmD 12/10/2023 2:55 AM

## 2023-12-11 LAB — CBC
HCT: 32.2 % — ABNORMAL LOW (ref 39.0–52.0)
Hemoglobin: 10.5 g/dL — ABNORMAL LOW (ref 13.0–17.0)
MCH: 30.6 pg (ref 26.0–34.0)
MCHC: 32.6 g/dL (ref 30.0–36.0)
MCV: 93.9 fL (ref 80.0–100.0)
Platelets: 153 K/uL (ref 150–400)
RBC: 3.43 MIL/uL — ABNORMAL LOW (ref 4.22–5.81)
RDW: 13.3 % (ref 11.5–15.5)
WBC: 10.6 K/uL — ABNORMAL HIGH (ref 4.0–10.5)
nRBC: 0 % (ref 0.0–0.2)

## 2023-12-11 LAB — GLUCOSE, CAPILLARY
Glucose-Capillary: 116 mg/dL — ABNORMAL HIGH (ref 70–99)
Glucose-Capillary: 155 mg/dL — ABNORMAL HIGH (ref 70–99)
Glucose-Capillary: 169 mg/dL — ABNORMAL HIGH (ref 70–99)
Glucose-Capillary: 264 mg/dL — ABNORMAL HIGH (ref 70–99)
Glucose-Capillary: 196 mg/dL — ABNORMAL HIGH (ref 70–99)

## 2023-12-11 LAB — BASIC METABOLIC PANEL WITH GFR
Anion gap: 10 (ref 5–15)
BUN: 28 mg/dL — ABNORMAL HIGH (ref 8–23)
CO2: 22 mmol/L (ref 22–32)
Calcium: 8.3 mg/dL — ABNORMAL LOW (ref 8.9–10.3)
Chloride: 105 mmol/L (ref 98–111)
Creatinine, Ser: 1.22 mg/dL (ref 0.61–1.24)
GFR, Estimated: >60 mL/min (ref >60)
Glucose, Bld: 127 mg/dL — ABNORMAL HIGH (ref 70–99)
Potassium: 4.3 mmol/L (ref 3.5–5.1)
Sodium: 137 mmol/L (ref 135–145)

## 2023-12-11 MED ORDER — SODIUM CHLORIDE 0.9 % IV SOLN
2.0000 g | INTRAVENOUS | Status: DC
  Administered 2023-12-11 – 2023-12-12 (×2): 2 g via INTRAVENOUS
  Filled 2023-12-11 (×2): qty 20

## 2023-12-11 NOTE — TOC Initial Note (Signed)
 Transition of Care [facility]) - Initial/Assessment Note    Patient Details  Name: [name] MRN: [id number] Date of Birth: 04-04-1950  [facility] of [facility] CM/SW Contact:    Bascom Service, RN Phone Number: 12/11/2023, 3:34 PM  Clinical Narrative: d/c plan home No CM needs.                  Expected Discharge Plan: Home/Self Care Barriers to Discharge: Continued Medical Work up   Patient Goals and CMS Choice Patient states their goals for this hospitalization and ongoing recovery are:: Home CMS Medicare.gov Compare Post Acute Care list provided to:: Patient Choice offered to / list presented to : Patient [place] ownership interest in [facility].provided to:: Patient    Expected Discharge Plan and Services   Discharge Planning Services: CM Consult   Living arrangements for the past 2 months: Single Family Home                                      Prior Living Arrangements/Services Living arrangements for the past 2 months: Single Family Home Lives with:: Spouse                   Activities of Daily Living   ADL Screening (condition at time of admission) Independently performs ADLs?: Yes (appropriate for developmental age) Is the patient deaf or have difficulty hearing?: No Does the patient have difficulty seeing, even when wearing glasses/contacts?: No Does the patient have difficulty concentrating, remembering, or making decisions?: No  Permission Sought/Granted                  Emotional Assessment              Admission diagnosis:  Cellulitis of right leg [L03.115] Fever, unspecified fever cause [R50.9] Cellulitis, unspecified cellulitis site [L03.90] Sepsis, due to unspecified organism, unspecified whether acute organ dysfunction present [employer]) [A41.9] Patient Active Problem List   Diagnosis Date Noted   Chronic heart failure with preserved ejection fraction (HFpEF) (HCC) 12/10/2023   Edema of right lower extremity  12/10/2023   Cellulitis of right leg 12/09/2023   Sepsis (HCC) 10/25/2023   Cellulitis 10/23/2023   Diabetic infection of right foot (HCC) 07/07/2022   Acute mycotic otitis externa 08/05/2020   Normocytic anemia 11/21/2018   Rhegmatogenous retinal detachment of right eye 10/12/2018   Stage 3a chronic kidney disease (CKD) (HCC) 06/19/2018   Laceration of elbow 04/30/2018   Injury, superficial, elbow, forearm, or wrist with infection 04/16/2018   Superficial injury of elbow, forearm and wrist 04/16/2018   Colonoscopy refused 06/13/2017   Postoperative hypothyroidism 04/12/2017   Statin intolerance 01/18/2017   S/P CABG x 3 07/14/2015   S/P AVR 07/09/2015   Proliferative diabetic retinopathy of left eye without macular edema associated with type 1 diabetes mellitus (HCC) 05/15/2015   Numbness and tingling of right arm and leg 05/06/2015   Polypharmacy 04/09/2014   Carotid occlusion, right 01/16/2014   Carotid stenosis    Stroke [facility])    TIA (transient ischemic attack) 01/12/2014   Pre-syncope 05/13/2013   Essential hypertension 03/18/2013   Trochanteric bursitis of left hip 01/18/2013   Preventative health care 01/18/2013   OSA (obstructive sleep apnea)    ED (erectile dysfunction) of organic origin 07/11/2012   Hypertrophy of prostate without urinary obstruction and other lower urinary tract symptoms (LUTS) 07/11/2012   Psychosexual dysfunction with  inhibited sexual excitement 07/11/2012   Mild cognitive impairment 05/09/2012   Poorly controlled type 1 diabetes mellitus with circulatory disorder (HCC) 05/09/2012   CAD (coronary artery disease) 01/11/2012   Hx of vitrectomy 12/20/2011   Abnormal nuclear cardiac imaging test, 10/2011 10/28/2011   PAD (peripheral artery disease) 09/28/2011   Disequilibrium syndrome 09/28/2011   Aortic stenosis, moderate 09/28/2011   Carotid stenosis, right 09/28/2011   Colon cancer screening 04/21/2011   Fatigue 04/21/2011   Hyperlipidemia  03/21/2011   Diabetic peripheral neuropathy (HCC) 01/19/2011   Restless legs syndrome 01/19/2011   Hypothyroidism 01/13/2011   Presence of intraocular lens 12/13/2010   PCP:  Darilyn Rosalva Bruckner, PA-C Pharmacy:   [id number] [place], [place] - [address] [place] [address] [place] [address] [place] [address] Phone: [phone] Fax: [phone]  [place] [facility] [address] [place] [address] Phone: [phone] Fax: [phone]     Social Drivers of Health (SDOH) Social History: SDOH Screenings   Food Insecurity: No Food Insecurity (12/10/2023)  Housing: Low Risk  (12/10/2023)  Transportation Needs: No Transportation Needs (12/10/2023)  Utilities: Not At Risk (12/10/2023)  Social Connections: Moderately Isolated (12/10/2023)  Tobacco Use: Medium Risk (12/09/2023)   SDOH Interventions:     Readmission Risk Interventions     No data to display

## 2023-12-11 NOTE — Progress Notes (Signed)
 Mobility Specialist - Progress Note   12/11/23 1205  Mobility  Activity Ambulated with assistance  Level of Assistance Standby assist, set-up cues, supervision of patient - no hands on  Assistive Device Cane  Distance Ambulated (ft) 200 ft  Range of Motion/Exercises Active  Activity Response Tolerated well  Mobility visit 1 Mobility  Mobility Specialist Start Time (ACUTE ONLY) 1155  Mobility Specialist Stop Time (ACUTE ONLY) 1205  Mobility Specialist Time Calculation (min) (ACUTE ONLY) 10 min   Pt was found in bed and agreeable to mobilize. Had x1 LOB but able to self correct. At EOS returned to bed with all needs met. Call bell in reach. Wife in room.   Erminio Leos,  Mobility Specialist Can be reached via Secure Chat

## 2023-12-11 NOTE — Progress Notes (Signed)
   12/11/23 2333  BiPAP/CPAP/SIPAP  BiPAP/CPAP/SIPAP Pt Type Adult  BiPAP/CPAP/SIPAP Resmed  Dentures removed? Not applicable  FiO2 (%) 21 %  Patient Home Machine Yes  Safety Check Completed by RT for Home Unit Yes, no issues noted  Patient Home Mask Yes  Patient Home Tubing Yes  Auto Titrate Yes  Minimum cmH2O 5 cmH2O  Maximum cmH2O 20 cmH2O  Device Plugged into RED Power Outlet Yes  BiPAP/CPAP /SiPAP Vitals  Resp 18  MEWS Score/Color  MEWS Score 0  MEWS Score Color Green

## 2023-12-11 NOTE — Plan of Care (Incomplete)
  Problem: Clinical Measurements: Goal: Ability to maintain clinical measurements within normal limits will improve Outcome: Progressing Goal: Will remain free from infection Outcome: Progressing Goal: Diagnostic test results will improve Outcome: Progressing   Problem: Skin Integrity: Goal: Risk for impaired skin integrity will decrease Outcome: Progressing   Problem: Clinical Measurements: Goal: Ability to avoid or minimize complications of infection will improve Outcome: Progressing   Problem: Education: Goal: Knowledge of General Education information will improve Description: Including pain rating scale, medication(s)/side effects and non-pharmacologic comfort measures Outcome: Adequate for Discharge   Problem: Health Behavior/Discharge Planning: Goal: Ability to manage health-related needs will improve Outcome: Adequate for Discharge   Problem: Clinical Measurements: Goal: Respiratory complications will improve Outcome: Adequate for Discharge Goal: Cardiovascular complication will be avoided Outcome: Adequate for Discharge   Problem: Activity: Goal: Risk for activity intolerance will decrease Outcome: Adequate for Discharge   Problem: Nutrition: Goal: Adequate nutrition will be maintained Outcome: Adequate for Discharge   Problem: Coping: Goal: Level of anxiety will decrease Outcome: Adequate for Discharge   Problem: Elimination: Goal: Will not experience complications related to bowel motility Outcome: Adequate for Discharge Goal: Will not experience complications related to urinary retention Outcome: Adequate for Discharge   Problem: Pain Managment: Goal: General experience of comfort will improve and/or be controlled Outcome: Adequate for Discharge   Problem: Safety: Goal: Ability to remain free from injury will improve Outcome: Adequate for Discharge   Problem: Skin Integrity: Goal: Skin integrity will improve Outcome: Adequate for Discharge

## 2023-12-11 NOTE — Progress Notes (Addendum)
 PROGRESS NOTE  [name]  FMW:[id number] DOB: [birth year] DOA: 12/09/2023 PCP: Darilyn Rosalva Bruckner, PA-C   Brief Narrative: Patient is a 73 year old male with history of type 1 diabetes on insulin  pump, diabetic neuropathy/retinopathy, charcoaled right foot, HFpEF, hypertension, hyperlipidemia, CKD stage III, CAD status post CABG, aortic stenosis status post bioprosthetic aortic valve replacement in 2017, right carotid stenosis status post stenting, CVA, GERD, hypothyroidism, OSA on CPAP, BPH who presented with right lower extremity warmth and erythema.  He was recently admitted for sepsis secondary to right lower extremity cellulitis.  Also had chills and fever at home.  Mentation, he was febrile, tachycardic but not hypotensive.  Lab work showed WC count of 15.2, creatinine of 1.5, lactic acid was normal.  Patient was admitted for the management of right lower extremity cellulitis.  Started on broad-spectrum antibiotics.  Blood cultures showed E. coli.  Clinically improving.  Possible discharge home tomorrow with oral antibiotics  Assessment & Plan:  Principal Problem:   Cellulitis of right leg Active Problems:   Stage 3a chronic kidney disease (CKD) (HCC)   Sepsis (HCC)   Chronic heart failure with preserved ejection fraction (HFpEF) (HCC)   Edema of right lower extremity   Sepsis secondary to right lower EXTR cellulitis/gram negative bacteremia: Recently admitted for the same.  Presented with fever, chills, erythema, warmth of right lower extremity.  Has chronic right lower extremity edema.  Venous Doppler did not show any DVT. x-ray of the right leg negative for osteomyelitis.    Right lower leg extremity looks much better.  Leukocytosis much better today. Blood cultures showed E. coli.  Source of infection is most likely cellulitis.  Continue ceftriaxone  for now.  Had mild grade fever yesterday afternoon but not today.  AKI in CKD stage III: Baseline creatinine around 1.1.   Started on gentle IV fluid with improvement in the kidney function.  Currently kidney function at baseline  Chronic HFpEF: Last echo on 10/11/2023 showed EF of 60 to 65%, grade 2 diastolic dysfunction.  Mild elevated BNP.  Overall euvolemic today  Type 1 diabetes: Recent A1c of 7.  Continue current insulin  regimen.  On insulin  pump at home.  Hypertension: Currently normotensive.  Takes losartan , hydrochlorothiazide at home.  Currently on hold  Hyperlipidemia: On Repatha  injection  History of coronary artery disease/carotid stenosis/CVA: Status post CABG.  Had a stent for carotid artery stenosis.  Continue aspirin , Plavix   GERD: Continue PPI  Hypothyroidism: Continue Synthyroid  OSA: On CPAP at night  History of BPH: Continue Flomax .  Normocytic  anemia: Currently hemoglobin stable  Debility/deconditioning: Patient ambulates with the help of cane.  Has poor ambulatory function.  Will consult PT  Obesity: BMI of 30.5        DVT prophylaxis:enoxaparin  (LOVENOX ) injection 40 mg Start: 12/10/23 1000     Code Status: Full Code  Family Communication: None at the bedside  Patient status: Inpatient  Patient is from : Home  Anticipated discharge to: Home  Estimated DC date: Tomorrow   Consultants: None  Procedures: None  Antimicrobials:  Anti-infectives (From admission, onward)    Start     Dose/Rate Route Frequency Ordered Stop   12/11/23 0200  cefTRIAXone  (ROCEPHIN ) 2 g in sodium chloride  0.9 % 100 mL IVPB        2 g 200 mL/hr over 30 Minutes Intravenous Every 24 hours 12/11/23 0013     12/11/23 0000  vancomycin  (VANCOREADY) IVPB 1500 mg/300 mL  Status:  Discontinued  1,500 mg 150 mL/hr over 120 Minutes Intravenous Every 24 hours 12/10/23 0302 12/11/23 0013   12/10/23 1000  metroNIDAZOLE  (FLAGYL ) IVPB 500 mg  Status:  Discontinued        500 mg 100 mL/hr over 60 Minutes Intravenous Every 12 hours 12/10/23 0246 12/10/23 1053   12/10/23 1000  ceFEPIme   (MAXIPIME ) 2 g in sodium chloride  0.9 % 100 mL IVPB  Status:  Discontinued        2 g 200 mL/hr over 30 Minutes Intravenous Every 12 hours 12/10/23 0302 12/10/23 0921   12/10/23 1000  ceFEPIme  (MAXIPIME ) 2 g in sodium chloride  0.9 % 100 mL IVPB  Status:  Discontinued        2 g 200 mL/hr over 30 Minutes Intravenous Every 8 hours 12/10/23 0921 12/11/23 0013   12/10/23 0000  vancomycin  (VANCOCIN ) IVPB 1000 mg/200 mL premix       Placed in Followed by Linked Group   1,000 mg 200 mL/hr over 60 Minutes Intravenous  Once 12/09/23 2217 12/10/23 1026   12/09/23 2300  vancomycin  (VANCOCIN ) IVPB 1000 mg/200 mL premix       Placed in Followed by Linked Group   1,000 mg 200 mL/hr over 60 Minutes Intravenous  Once 12/09/23 2217 12/10/23 0008   12/09/23 2230  ceFEPIme  (MAXIPIME ) 2 g in sodium chloride  0.9 % 100 mL IVPB        2 g 200 mL/hr over 30 Minutes Intravenous  Once 12/09/23 2215 12/09/23 2308   12/09/23 2230  metroNIDAZOLE  (FLAGYL ) IVPB 500 mg        500 mg 100 mL/hr over 60 Minutes Intravenous  Once 12/09/23 2215 12/10/23 0008   12/09/23 2230  vancomycin  (VANCOCIN ) IVPB 1000 mg/200 mL premix  Status:  Discontinued        1,000 mg 200 mL/hr over 60 Minutes Intravenous  Once 12/09/23 2215 12/09/23 2216       Subjective: Patient seen and examined at bedside today.  Hemodynamically stable.  Comfortable this morning.  Right lower extremity looks much better.  He has chronic right lower extremity edema.  Erythema is better, some shrinkage of the skin noted.  Pain much improved  Objective: Vitals:   12/10/23 1500 12/10/23 1956 12/10/23 2217 12/11/23 0520  BP:  (!) 120/59  (!) 112/54  Pulse:  91  91  Resp:  18 20 18   Temp: 98.1 F (36.7 C) 99.5 F (37.5 C)  98.5 F (36.9 C)  TempSrc: Oral Oral  Oral  SpO2:  96%  95%  Weight:      Height:        Intake/Output Summary (Last 24 hours) at 12/11/2023 1128 Last data filed at 12/11/2023 1000 Gross per 24 hour  Intake 830 ml  Output  1350 ml  Net -520 ml   Filed Weights   12/09/23 2206  Weight: 108 kg    Examination:  General exam: Overall comfortable, not in distress, obese HEENT: PERRL Respiratory system:  no wheezes or crackles  Cardiovascular system: S1 & S2 heard, RRR.  Gastrointestinal system: Abdomen is nondistended, soft and nontender. Central nervous system: Alert and oriented Extremities: Chronic right lower extremity edema, no clubbing ,no cyanosis, mild erythema of the right leg, amputation of the right great toe Skin: no ulcers,no icterus     Data Reviewed: I have personally reviewed following labs and imaging studies  CBC: Recent Labs  Lab 12/09/23 2227 12/10/23 0523 12/11/23 0513  WBC 15.2* 18.6* 10.6*  NEUTROABS 13.7*  --   --  HGB 12.5* 11.2* 10.5*  HCT 36.6* 33.3* 32.2*  MCV 90.4 92.2 93.9  PLT 204 177 153   Basic Metabolic Panel: Recent Labs  Lab 12/09/23 2227 12/10/23 0523 12/11/23 0513  NA 138 136 137  K 4.4 4.5 4.3  CL 102 104 105  CO2 25 23 22   GLUCOSE 200* 178* 127*  BUN 34* 34* 28*  CREATININE 1.58* 1.32* 1.22  CALCIUM  9.0 7.9* 8.3*     Recent Results (from the past 240 hours)  Blood Culture (routine x 2)     Status: Abnormal (Preliminary result)   Collection Time: 12/09/23 10:25 PM   Specimen: BLOOD  Result Value Ref Range Status   Specimen Description   Final    BLOOD RIGHT ANTECUBITAL Performed at [facility], [address] [place], [place] [address]    Special Requests   Final    BOTTLES DRAWN AEROBIC AND ANAEROBIC Blood Culture adequate volume Performed at [facility], [address] [place], [place] [address]    Culture  Setup Time   Final    GRAM NEGATIVE RODS IN BOTH AEROBIC AND ANAEROBIC BOTTLES RBV MITCHELL PHARM D 12/10/2023 @ 2310 BY DD    Culture (A)  Final    ESCHERICHIA COLI SUSCEPTIBILITIES TO FOLLOW Performed at [facility], [address] [place], [place] [address]    Report  Status PENDING  Incomplete  Blood Culture ID Panel (Reflexed)     Status: Abnormal   Collection Time: 12/09/23 10:25 PM  Result Value Ref Range Status   Enterococcus faecalis NOT DETECTED NOT DETECTED Final   Enterococcus Faecium NOT DETECTED NOT DETECTED Final   Listeria monocytogenes NOT DETECTED NOT DETECTED Final   Staphylococcus species NOT DETECTED NOT DETECTED Final   Staphylococcus aureus (BCID) NOT DETECTED NOT DETECTED Final   Staphylococcus epidermidis NOT DETECTED NOT DETECTED Final   Staphylococcus lugdunensis NOT DETECTED NOT DETECTED Final   Streptococcus species NOT DETECTED NOT DETECTED Final   Streptococcus agalactiae NOT DETECTED NOT DETECTED Final   Streptococcus pneumoniae NOT DETECTED NOT DETECTED Final   Streptococcus pyogenes NOT DETECTED NOT DETECTED Final   A.calcoaceticus-baumannii NOT DETECTED NOT DETECTED Final   Bacteroides fragilis NOT DETECTED NOT DETECTED Final   Enterobacterales DETECTED (A) NOT DETECTED Corrected    Comment: Enterobacterales represent a large order of gram negative bacteria, not a single organism. CRITICAL RESULT CALLED TO, READ BACK BY AND VERIFIED WITH: RBV MITCHELL PHARM D 12/10/2023 @ 2310 BY DD CORRECTED ON 12/07 AT 2331: PREVIOUSLY REPORTED AS DETECTED Enterobacterales represent a large order of gram negative bacteria, not a single organism. CRITICAL RESULT CALLED TO, READ BACK BY AND VERIFIED WITH: RBV ATOOSA RN 12/10/2023 @ 2308 BY DD    Enterobacter cloacae complex NOT DETECTED NOT DETECTED Final   Escherichia coli DETECTED (A) NOT DETECTED Corrected    Comment: CRITICAL RESULT CALLED TO, READ BACK BY AND VERIFIED WITH: RBV MITCHELL PHARM D 12/10/2023 @ 2310 BY DD CORRECTED ON 12/07 AT 2331: PREVIOUSLY REPORTED AS DETECTED CRITICAL RESULT CALLED TO, READ BACK BY AND VERIFIED WITH: RBV ATOOSA RN 12/10/2023 @ 2308 BY DD    Klebsiella aerogenes NOT DETECTED NOT DETECTED Final   Klebsiella oxytoca NOT DETECTED NOT DETECTED Final    Klebsiella pneumoniae NOT DETECTED NOT DETECTED Final   Proteus species NOT DETECTED NOT DETECTED Final   Salmonella species NOT DETECTED NOT DETECTED Final   Serratia marcescens NOT DETECTED NOT DETECTED Final   Haemophilus influenzae NOT DETECTED NOT  DETECTED Final   Neisseria meningitidis NOT DETECTED NOT DETECTED Final   Pseudomonas aeruginosa NOT DETECTED NOT DETECTED Final   Stenotrophomonas maltophilia NOT DETECTED NOT DETECTED Final   Candida albicans NOT DETECTED NOT DETECTED Final   Candida auris NOT DETECTED NOT DETECTED Final   Candida glabrata NOT DETECTED NOT DETECTED Final   Candida krusei NOT DETECTED NOT DETECTED Final   Candida parapsilosis NOT DETECTED NOT DETECTED Final   Candida tropicalis NOT DETECTED NOT DETECTED Final   Cryptococcus neoformans/gattii NOT DETECTED NOT DETECTED Final   CTX-M ESBL NOT DETECTED NOT DETECTED Final   Carbapenem resistance IMP NOT DETECTED NOT DETECTED Final   Carbapenem resistance KPC NOT DETECTED NOT DETECTED Final   Carbapenem resistance NDM NOT DETECTED NOT DETECTED Final   Carbapenem resist OXA 48 LIKE NOT DETECTED NOT DETECTED Final   Carbapenem resistance VIM NOT DETECTED NOT DETECTED Final    Comment: Performed at [facility], [address], [place], [place] [address]  Resp panel by RT-PCR (RSV, Flu A&B, Covid) Anterior Nasal Swab     Status: None   Collection Time: 12/09/23 10:27 PM   Specimen: Anterior Nasal Swab  Result Value Ref Range Status   SARS Coronavirus 2 by RT PCR NEGATIVE NEGATIVE Final    Comment: (NOTE) SARS-CoV-2 target nucleic acids are NOT DETECTED.  The SARS-CoV-2 RNA is generally detectable in upper respiratory specimens during the acute phase of infection. The lowest concentration of SARS-CoV-2 viral copies this assay can detect is 138 copies/mL. A negative result does not preclude SARS-Cov-2 infection and should not be used as the sole basis for treatment or other patient management decisions.  A negative result may occur with  improper specimen collection/handling, submission of specimen other than nasopharyngeal swab, presence of viral mutation(s) within the areas targeted by this assay, and inadequate number of viral copies(<138 copies/mL). A negative result must be combined with clinical observations, patient history, and epidemiological information. The expected result is Negative.  Fact Sheet for Patients:  bloggercourse.com  Fact Sheet for Healthcare Providers:  seriousbroker.it  This test is no t yet approved or cleared by the [place]  FDA and  has been authorized for detection and/or diagnosis of SARS-CoV-2 by FDA under an Emergency Use Authorization (EUA). This EUA will remain  in effect (meaning this test can be used) for the duration of the COVID-19 declaration under Section 564(b)(1) of the Act, 21 U.S.C.section 360bbb-3(b)(1), unless the authorization is terminated  or revoked sooner.       Influenza A by PCR NEGATIVE NEGATIVE Final   Influenza B by PCR NEGATIVE NEGATIVE Final    Comment: (NOTE) The Xpert Xpress SARS-CoV-2/FLU/RSV plus assay is intended as an aid in the diagnosis of influenza from Nasopharyngeal swab specimens and should not be used as a sole basis for treatment. Nasal washings and aspirates are unacceptable for Xpert Xpress SARS-CoV-2/FLU/RSV testing.  Fact Sheet for Patients: bloggercourse.com  Fact Sheet for Healthcare Providers: seriousbroker.it  This test is not yet approved or cleared by the [place]  FDA and has been authorized for detection and/or diagnosis of SARS-CoV-2 by FDA under an Emergency Use Authorization (EUA). This EUA will remain in effect (meaning this test can be used) for the duration of the COVID-19 declaration under Section 564(b)(1) of the Act, 21 U.S.C. section 360bbb-3(b)(1), unless the authorization  is terminated or revoked.     Resp Syncytial Virus by PCR NEGATIVE NEGATIVE Final    Comment: (NOTE) Fact Sheet for Patients: bloggercourse.com  Fact Sheet for Healthcare Providers: seriousbroker.it  This test is not yet approved or cleared by the [place]  FDA and has been authorized for detection and/or diagnosis of SARS-CoV-2 by FDA under an Emergency Use Authorization (EUA). This EUA will remain in effect (meaning this test can be used) for the duration of the COVID-19 declaration under Section 564(b)(1) of the Act, 21 U.S.C. section 360bbb-3(b)(1), unless the authorization is terminated or revoked.  Performed at [facility], [address] [place], [place] [address]   Blood Culture (routine x 2)     Status: None (Preliminary result)   Collection Time: 12/09/23 10:30 PM   Specimen: BLOOD LEFT FOREARM  Result Value Ref Range Status   Specimen Description   Final    BLOOD LEFT FOREARM Performed at [facility], [address] [place], [place] [address]    Special Requests   Final    BOTTLES DRAWN AEROBIC AND ANAEROBIC Blood Culture adequate volume Performed at [facility], [address] [place], [place] [address]    Culture   Final    NO GROWTH < 24 HOURS Performed at [facility], [address] [place], [place] [address]    Report Status PENDING  Incomplete     Radiology Studies: VAS US  LOWER EXTREMITY VENOUS (DVT) Result Date: 12/10/2023  Lower Venous DVT Study Patient Name:  [name]  Date of Exam:   12/10/2023 Medical Rec #: [id number]         Accession #:    [id number] Date of Birth: 1950/04/14         Patient Gender: M Patient Age:   69 years Exam Location:  [facility] Procedure:      VAS US  LOWER EXTREMITY VENOUS (DVT) Referring Phys: EDITHA RATHORE  --------------------------------------------------------------------------------  Indications: Edema, Pain, and Recurrent cellulitis and sepsis.  Risk Factors: Patient with known history of PAD. Not amenable to to open or endovascular treatment therapies. Limitations: Significant calf edema, poor ultrasound/tissue interface and body habitus. Comparison Study: Prior negative right LEV done 10/23/23 and 10/18/21 Performing Technologist: Alberta Lis RVS  Examination Guidelines: A complete evaluation includes B-mode imaging, spectral Doppler, color Doppler, and power Doppler as needed of all accessible portions of each vessel. Bilateral testing is considered an integral part of a complete examination. Limited examinations for reoccurring indications may be performed as noted. The reflux portion of the exam is performed with the patient in reverse Trendelenburg.  +---------+---------------+---------+-----------+----------+-------------------+ RIGHT    CompressibilityPhasicitySpontaneityPropertiesThrombus Aging      +---------+---------------+---------+-----------+----------+-------------------+ CFV      Full           Yes      Yes                                      +---------+---------------+---------+-----------+----------+-------------------+ SFJ      Full                                                             +---------+---------------+---------+-----------+----------+-------------------+ FV Prox  Full           Yes      Yes                                      +---------+---------------+---------+-----------+----------+-------------------+  FV Mid   Full                                                             +---------+---------------+---------+-----------+----------+-------------------+ FV DistalFull           Yes      Yes                                      +---------+---------------+---------+-----------+----------+-------------------+ PFV      Full                                                              +---------+---------------+---------+-----------+----------+-------------------+ POP      Full           Yes      Yes                                      +---------+---------------+---------+-----------+----------+-------------------+ PTV                                                   Not well visualized +---------+---------------+---------+-----------+----------+-------------------+ PERO                                                  Not well visualized +---------+---------------+---------+-----------+----------+-------------------+ Gastroc  Full                                                             +---------+---------------+---------+-----------+----------+-------------------+   +----+---------------+---------+-----------+----------+--------------+ LEFTCompressibilityPhasicitySpontaneityPropertiesThrombus Aging +----+---------------+---------+-----------+----------+--------------+ CFV Full           Yes      Yes                                 +----+---------------+---------+-----------+----------+--------------+ SFJ Full                                                        +----+---------------+---------+-----------+----------+--------------+     Summary: RIGHT: - There is no evidence of deep vein thrombosis in the lower extremity. However, portions of this examination were limited- see technologist comments above.  - No cystic structure found in the popliteal fossa. - Ultrasound characteristics of enlarged lymph nodes are noted in the groin. Subcutaneous edema noted throughout calf  LEFT: - No evidence of common femoral vein obstruction.   *See table(s) above for measurements and observations. Electronically signed by Debby Robertson on 12/10/2023 at 8:39:25 PM.    Final    DG Tibia/Fibula Right Result Date: 12/09/2023 EXAM: XR TIBIA AND FIBULA, 2 VIEW(S) 12/09/2023 10:50:00 PM COMPARISON:  None available. CLINICAL HISTORY: swelling, infection FINDINGS: BONES AND JOINTS: Tibial intramedullary nail with proximal and distal locking screw fixation intact. Healed tibial mid-diaphyseal fracture. Chondrocalcinosis of the knee. No malalignment. SOFT TISSUES: Diffuse subcutaneous edema. Vascular calcifications. IMPRESSION: 1. Healed tibial mid-diaphyseal fracture with intact intramedullary nail and locking screw fixation. 2. Diffuse subcutaneous edema. 3. Chondrocalcinosis of the knee. Electronically signed by: Dorethia Molt MD 12/09/2023 10:58 PM EST RP Workstation: [id number]   DG Foot Complete Right Result Date: 12/09/2023 EXAM: 3 OR MORE VIEW(S) XRAY OF THE _LATERALITY_ FOOT 12/09/2023 10:50:00 PM COMPARISON: 10/23/2023 CLINICAL HISTORY: Questionable sepsis - evaluate for abnormality FINDINGS: BONES AND JOINTS: Prior ankle arthrodesis with intact hardware. 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 Talonavicular dislocation and midfoot osteoarthritis. SOFT TISSUES: Soft tissue ulcer posterior to the calcaneus. Diffuse soft tissue swelling. No subjacent osseous erosion to suggest osteomyelitis. Vascular calcifications noted. IMPRESSION: 1. Soft tissue ulcer posterior to the calcaneus with diffuse soft tissue swelling. No subjacent osseous erosion to suggest osteomyelitis. 2. Talonavicular dislocation and midfoot osteoarthritis. 3. 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 4. Prior ankle arthrodesis with intact visualized  hardware. Electronically signed by: Dorethia Molt MD 12/09/2023 10:57 PM EST RP Workstation: [id number]   DG Chest Port 1 View Result Date: 12/09/2023 EXAM: 1 VIEW(S) XRAY OF THE CHEST 12/09/2023 10:50:12 PM COMPARISON: 10/23/2023 CLINICAL HISTORY: Questionable sepsis - evaluate for abnormality FINDINGS:  LINES, TUBES AND DEVICES: Surgical clips in lower neck. LUNGS AND PLEURA: Bibasilar atelectasis. No pleural effusion. No pneumothorax. HEART AND MEDIASTINUM: Cardiomegaly. Status post coronary artery bypass grafting. BONES AND SOFT TISSUES: No acute osseous abnormality. IMPRESSION: 1. No acute cardiopulmonary abnormality. 2. Cardiomegaly. 3. Status post coronary artery bypass grafting. Electronically signed by: Dorethia Molt MD 12/09/2023 10:55 PM EST RP Workstation: [id number]    Scheduled Meds:  aspirin  EC  81 mg Oral QPM   clopidogrel   75 mg Oral QPM   enoxaparin  (LOVENOX ) injection  40 mg Subcutaneous Q24H   insulin  pump   Subcutaneous TID WC, HS, 0200   levothyroxine   150 mcg Oral Q0600   pantoprazole   40 mg Oral QAC supper   tamsulosin   0.4 mg Oral QPM   Continuous Infusions:  cefTRIAXone  (ROCEPHIN )  IV 2 g (12/11/23 0250)     LOS: 1 day   Ivonne Mustache, MD [facility] P12/08/2023, 11:28 AM

## 2023-12-12 LAB — CULTURE, BLOOD (ROUTINE X 2): Special Requests: ADEQUATE

## 2023-12-12 LAB — GLUCOSE, CAPILLARY
Glucose-Capillary: 148 mg/dL — ABNORMAL HIGH (ref 70–99)
Glucose-Capillary: 152 mg/dL — ABNORMAL HIGH (ref 70–99)
Glucose-Capillary: 151 mg/dL — ABNORMAL HIGH (ref 70–99)

## 2023-12-12 MED ORDER — SODIUM CHLORIDE 0.9 % IV SOLN
1.0000 g | Freq: Three times a day (TID) | INTRAVENOUS | Status: DC
  Filled 2023-12-12: qty 20

## 2023-12-12 MED ORDER — LORATADINE 10 MG PO TABS
10.0000 mg | ORAL_TABLET | Freq: Every day | ORAL | Status: DC
  Administered 2023-12-12 – 2023-12-15 (×4): 10 mg via ORAL
  Filled 2023-12-12 (×4): qty 1

## 2023-12-12 MED ORDER — LOPERAMIDE HCL 2 MG PO CAPS
2.0000 mg | ORAL_CAPSULE | ORAL | Status: AC | PRN
  Administered 2023-12-12: 2 mg via ORAL

## 2023-12-12 MED ORDER — SODIUM CHLORIDE 0.9 % IV SOLN
1.0000 g | Freq: Once | INTRAVENOUS | Status: AC
  Administered 2023-12-12: 1 g via INTRAVENOUS
  Filled 2023-12-12 (×3): qty 1000

## 2023-12-12 MED ORDER — LEVOFLOXACIN 750 MG PO TABS
750.0000 mg | ORAL_TABLET | Freq: Every day | ORAL | Status: DC
  Administered 2023-12-13 – 2023-12-15 (×3): 750 mg via ORAL
  Filled 2023-12-12: qty 2
  Filled 2023-12-12 (×3): qty 1

## 2023-12-12 MED ORDER — LEVOTHYROXINE SODIUM 25 MCG PO TABS
137.5000 ug | ORAL_TABLET | Freq: Every day | ORAL | Status: AC
  Administered 2023-12-13 – 2023-12-15 (×3): 137.5 ug via ORAL
  Filled 2023-12-12 (×4): qty 0.5

## 2023-12-12 MED ORDER — SODIUM CHLORIDE 0.9 % IV SOLN
1.0000 g | Freq: Three times a day (TID) | INTRAVENOUS | Status: DC
  Administered 2023-12-12: 1 g via INTRAVENOUS
  Filled 2023-12-12 (×2): qty 20

## 2023-12-12 MED ORDER — LOPERAMIDE HCL 2 MG PO CAPS
2.0000 mg | ORAL_CAPSULE | Freq: Once | ORAL | Status: DC
  Filled 2023-12-12: qty 1

## 2023-12-12 MED ORDER — SODIUM CHLORIDE 0.9 % IV SOLN
1.0000 g | Freq: Three times a day (TID) | INTRAVENOUS | Status: DC
  Filled 2023-12-12 (×2): qty 20

## 2023-12-12 NOTE — Progress Notes (Signed)
 PROGRESS NOTE  [name]  FMW:[id number] DOB: [birth year] DOA: 12/09/2023 PCP: Darilyn Rosalva Bruckner, PA-C   Brief Narrative: Patient is a 73 year old male with history of type 1 diabetes on insulin  pump, diabetic neuropathy/retinopathy, charcoaled right foot, HFpEF, hypertension, hyperlipidemia, CKD stage III, CAD status post CABG, aortic stenosis status post bioprosthetic aortic valve replacement in 2017, right carotid stenosis status post stenting, CVA, GERD, hypothyroidism, OSA on CPAP, BPH who presented with right lower extremity warmth and erythema.  He was recently admitted for sepsis secondary to right lower extremity cellulitis.  Also had chills and fever at home.  Mentation, he was febrile, tachycardic but not hypotensive.  Lab work showed WC count of 15.2, creatinine of 1.5, lactic acid was normal.  Patient was admitted for the management of right lower extremity cellulitis.  Started on broad-spectrum antibiotics.  Blood cultures showed E. coli resistant to ceftriaxone .  Currently on meropenem .      Assessment & Plan:  Principal Problem:   Cellulitis of right leg Active Problems:   Stage 3a chronic kidney disease (CKD) (HCC)   Sepsis (HCC)   Chronic heart failure with preserved ejection fraction (HFpEF) (HCC)   Edema of right lower extremity   Sepsis secondary to right lower EXTR cellulitis/gram negative bacteremia: Recently admitted for the same.  Presented with fever, chills, erythema, warmth of right lower extremity.  Has chronic right lower extremity edema.  Venous Doppler did not show any DVT. x-ray of the right leg negative for osteomyelitis.    Right lower leg extremity looks much better.  Leukocytosis much better .  Remains afebrile Blood cultures showed E. coli resistant to ceftriaxone .  Source of infection is most likely cellulitis.  Antibiotic changed to meropenem .  Plan to start on levofloxacin  from tomorrow  AKI in CKD stage III: Baseline creatinine around  1.1.  Started on gentle IV fluid with improvement in the kidney function.  Currently kidney function at baseline  Chronic HFpEF: Last echo on 10/11/2023 showed EF of 60 to 65%, grade 2 diastolic dysfunction.  Mild elevated BNP.  Overall euvolemic today  Type 1 diabetes: Recent A1c of 7.  Continue current insulin  regimen.  On insulin  pump at home.  Diabetic coordinator following  Hypertension: Currently normotensive.  Takes losartan , hydrochlorothiazide at home.  Currently on hold  Hyperlipidemia: On Repatha  injection  History of coronary artery disease/carotid stenosis/CVA: Status post CABG.  Had a stent for carotid artery stenosis.  Continue aspirin , Plavix   GERD: Continue PPI  Hypothyroidism: Continue Synthyroid  OSA: On CPAP at night  History of BPH: Continue Flomax .  Normocytic  anemia: Currently hemoglobin stable  Debility/deconditioning: Patient ambulates with the help of cane.  Has poor ambulatory function.  PT consulted  Obesity: BMI of 30.5        DVT prophylaxis:enoxaparin  (LOVENOX ) injection 40 mg Start: 12/10/23 1000     Code Status: Full Code  Family Communication: None at the bedside  Patient status: Inpatient  Patient is from : Home  Anticipated discharge to: Home  Estimated DC date: Tomorrow or today   Consultants: None  Procedures: None  Antimicrobials:  Anti-infectives (From admission, onward)    Start     Dose/Rate Route Frequency Ordered Stop   12/13/23 1200  levofloxacin  (LEVAQUIN ) tablet 750 mg        750 mg Oral Daily 12/12/23 0925 12/19/23 0959   12/12/23 1030  meropenem  (MERREM ) 1 g in sodium chloride  0.9 % 100 mL IVPB  Status:  Discontinued  1 g 200 mL/hr over 30 Minutes Intravenous Every 8 hours 12/12/23 0923 12/12/23 0925   12/12/23 1030  meropenem  (MERREM ) 1 g in sodium chloride  0.9 % 100 mL IVPB        1 g 200 mL/hr over 30 Minutes Intravenous Every 8 hours 12/12/23 0925 12/13/23 1829   12/11/23 0200  cefTRIAXone   (ROCEPHIN ) 2 g in sodium chloride  0.9 % 100 mL IVPB  Status:  Discontinued        2 g 200 mL/hr over 30 Minutes Intravenous Every 24 hours 12/11/23 0013 12/12/23 0923   12/11/23 0000  vancomycin  (VANCOREADY) IVPB 1500 mg/300 mL  Status:  Discontinued        1,500 mg 150 mL/hr over 120 Minutes Intravenous Every 24 hours 12/10/23 0302 12/11/23 0013   12/10/23 1000  metroNIDAZOLE  (FLAGYL ) IVPB 500 mg  Status:  Discontinued        500 mg 100 mL/hr over 60 Minutes Intravenous Every 12 hours 12/10/23 0246 12/10/23 1053   12/10/23 1000  ceFEPIme  (MAXIPIME ) 2 g in sodium chloride  0.9 % 100 mL IVPB  Status:  Discontinued        2 g 200 mL/hr over 30 Minutes Intravenous Every 12 hours 12/10/23 0302 12/10/23 0921   12/10/23 1000  ceFEPIme  (MAXIPIME ) 2 g in sodium chloride  0.9 % 100 mL IVPB  Status:  Discontinued        2 g 200 mL/hr over 30 Minutes Intravenous Every 8 hours 12/10/23 0921 12/11/23 0013   12/10/23 0000  vancomycin  (VANCOCIN ) IVPB 1000 mg/200 mL premix       Placed in Followed by Linked Group   1,000 mg 200 mL/hr over 60 Minutes Intravenous  Once 12/09/23 2217 12/10/23 1026   12/09/23 2300  vancomycin  (VANCOCIN ) IVPB 1000 mg/200 mL premix       Placed in Followed by Linked Group   1,000 mg 200 mL/hr over 60 Minutes Intravenous  Once 12/09/23 2217 12/10/23 0008   12/09/23 2230  ceFEPIme  (MAXIPIME ) 2 g in sodium chloride  0.9 % 100 mL IVPB        2 g 200 mL/hr over 30 Minutes Intravenous  Once 12/09/23 2215 12/09/23 2308   12/09/23 2230  metroNIDAZOLE  (FLAGYL ) IVPB 500 mg        500 mg 100 mL/hr over 60 Minutes Intravenous  Once 12/09/23 2215 12/10/23 0008   12/09/23 2230  vancomycin  (VANCOCIN ) IVPB 1000 mg/200 mL premix  Status:  Discontinued        1,000 mg 200 mL/hr over 60 Minutes Intravenous  Once 12/09/23 2215 12/09/23 2216       Subjective: Patient seen and examined at the bedside today.  Very comfortable this morning.  Right lower extremity cellulitic area looks  better.  Less erythema.  Afebrile this morning with a stable blood pressure.  In very good spirits  Objective: Vitals:   12/11/23 1421 12/11/23 1939 12/11/23 2333 12/12/23 0304  BP: 124/60 (!) 130/58  135/68  Pulse: 81 83  85  Resp: 18 18 18 18   Temp: 97.9 F (36.6 C) 97.8 F (36.6 C)  98.2 F (36.8 C)  TempSrc: Oral Oral  Axillary  SpO2: 97% 99%  97%  Weight:      Height:        Intake/Output Summary (Last 24 hours) at 12/12/2023 1021 Last data filed at 12/12/2023 0956 Gross per 24 hour  Intake 970 ml  Output 450 ml  Net 520 ml   Filed Weights   12/09/23  2206  Weight: 108 kg    Examination:   General exam: Overall comfortable, not in distress,obese HEENT: PERRL Respiratory system:  no wheezes or crackles  Cardiovascular system: S1 & S2 heard, RRR.  Gastrointestinal system: Abdomen is nondistended, soft and nontender. Central nervous system: Alert and oriented Extremities: Chronic right lower extremity edema, mild erythema of the right leg, amputation of the right great toe Skin: No rashes, no ulcers,no icterus     Data Reviewed: I have personally reviewed following labs and imaging studies  CBC: Recent Labs  Lab 12/09/23 2227 12/10/23 0523 12/11/23 0513  WBC 15.2* 18.6* 10.6*  NEUTROABS 13.7*  --   --   HGB 12.5* 11.2* 10.5*  HCT 36.6* 33.3* 32.2*  MCV 90.4 92.2 93.9  PLT 204 177 153   Basic Metabolic Panel: Recent Labs  Lab 12/09/23 2227 12/10/23 0523 12/11/23 0513  NA 138 136 137  K 4.4 4.5 4.3  CL 102 104 105  CO2 25 23 22   GLUCOSE 200* 178* 127*  BUN 34* 34* 28*  CREATININE 1.58* 1.32* 1.22  CALCIUM  9.0 7.9* 8.3*     Recent Results (from the past 240 hours)  Blood Culture (routine x 2)     Status: Abnormal   Collection Time: 12/09/23 10:25 PM   Specimen: BLOOD  Result Value Ref Range Status   Specimen Description   Final    BLOOD RIGHT ANTECUBITAL Performed at [facility], [address] [place], [place]  [address]    Special Requests   Final    BOTTLES DRAWN AEROBIC AND ANAEROBIC Blood Culture adequate volume Performed at [facility], [address] [place], [place] [address]    Culture  Setup Time   Final    GRAM NEGATIVE RODS IN BOTH AEROBIC AND ANAEROBIC BOTTLES RBV MITCHELL PHARM D 12/10/2023 @ 2310 BY DD Performed at [facility], [address] [place], [place] [address]    Culture ESCHERICHIA COLI (A)  Final   Report Status 12/12/2023 FINAL  Final   Organism ID, Bacteria ESCHERICHIA COLI  Final      Susceptibility   Escherichia coli - MIC*    AMPICILLIN  >=32 RESISTANT Resistant     CEFAZOLIN  (NON-URINE) >=32 RESISTANT Resistant     CEFEPIME  <=0.12 SENSITIVE Sensitive     ERTAPENEM  <=0.12 SENSITIVE Sensitive     CEFTRIAXONE  4 RESISTANT Resistant     CIPROFLOXACIN <=0.06 SENSITIVE Sensitive     GENTAMICIN <=1 SENSITIVE Sensitive     MEROPENEM  <=0.25 SENSITIVE Sensitive     TRIMETH/SULFA <=20 SENSITIVE Sensitive     AMPICILLIN /SULBACTAM 16 INTERMEDIATE Intermediate     PIP/TAZO Value in next row Sensitive      <=4 SENSITIVEThis is a modified FDA-approved test that has been validated and its performance characteristics determined by the reporting laboratory.  This laboratory is certified under the Clinical Laboratory Improvement Amendments CLIA as qualified to perform high complexity clinical laboratory testing.    * ESCHERICHIA COLI  Blood Culture ID Panel (Reflexed)     Status: Abnormal   Collection Time: 12/09/23 10:25 PM  Result Value Ref Range Status   Enterococcus faecalis NOT DETECTED NOT DETECTED Final   Enterococcus Faecium NOT DETECTED NOT DETECTED Final   Listeria monocytogenes NOT DETECTED NOT DETECTED Final   Staphylococcus species NOT DETECTED NOT DETECTED Final   Staphylococcus aureus (BCID) NOT DETECTED NOT DETECTED Final   Staphylococcus epidermidis NOT DETECTED NOT DETECTED Final   Staphylococcus lugdunensis NOT DETECTED NOT DETECTED  Final   Streptococcus species NOT DETECTED NOT DETECTED Final   Streptococcus agalactiae NOT DETECTED NOT DETECTED Final   Streptococcus pneumoniae NOT DETECTED NOT DETECTED Final   Streptococcus pyogenes NOT DETECTED NOT DETECTED Final   A.calcoaceticus-baumannii NOT DETECTED NOT DETECTED Final   Bacteroides fragilis NOT DETECTED NOT DETECTED Final   Enterobacterales DETECTED (A) NOT DETECTED Corrected    Comment: Enterobacterales represent a large order of gram negative bacteria, not a single organism. CRITICAL RESULT CALLED TO, READ BACK BY AND VERIFIED WITH: RBV MITCHELL PHARM D 12/10/2023 @ 2310 BY DD CORRECTED ON 12/07 AT 2331: PREVIOUSLY REPORTED AS DETECTED Enterobacterales represent a large order of gram negative bacteria, not a single organism. CRITICAL RESULT CALLED TO, READ BACK BY AND VERIFIED WITH: RBV ATOOSA RN 12/10/2023 @ 2308 BY DD    Enterobacter cloacae complex NOT DETECTED NOT DETECTED Final   Escherichia coli DETECTED (A) NOT DETECTED Corrected    Comment: CRITICAL RESULT CALLED TO, READ BACK BY AND VERIFIED WITH: RBV MITCHELL PHARM D 12/10/2023 @ 2310 BY DD CORRECTED ON 12/07 AT 2331: PREVIOUSLY REPORTED AS DETECTED CRITICAL RESULT CALLED TO, READ BACK BY AND VERIFIED WITH: RBV ATOOSA RN 12/10/2023 @ 2308 BY DD    Klebsiella aerogenes NOT DETECTED NOT DETECTED Final   Klebsiella oxytoca NOT DETECTED NOT DETECTED Final   Klebsiella pneumoniae NOT DETECTED NOT DETECTED Final   Proteus species NOT DETECTED NOT DETECTED Final   Salmonella species NOT DETECTED NOT DETECTED Final   Serratia marcescens NOT DETECTED NOT DETECTED Final   Haemophilus influenzae NOT DETECTED NOT DETECTED Final   Neisseria meningitidis NOT DETECTED NOT DETECTED Final   Pseudomonas aeruginosa NOT DETECTED NOT DETECTED Final   Stenotrophomonas maltophilia NOT DETECTED NOT DETECTED Final   Candida albicans NOT DETECTED NOT DETECTED Final   Candida auris NOT DETECTED NOT DETECTED Final   Candida  glabrata NOT DETECTED NOT DETECTED Final   Candida krusei NOT DETECTED NOT DETECTED Final   Candida parapsilosis NOT DETECTED NOT DETECTED Final   Candida tropicalis NOT DETECTED NOT DETECTED Final   Cryptococcus neoformans/gattii NOT DETECTED NOT DETECTED Final   CTX-M ESBL NOT DETECTED NOT DETECTED Final   Carbapenem resistance IMP NOT DETECTED NOT DETECTED Final   Carbapenem resistance KPC NOT DETECTED NOT DETECTED Final   Carbapenem resistance NDM NOT DETECTED NOT DETECTED Final   Carbapenem resist OXA 48 LIKE NOT DETECTED NOT DETECTED Final   Carbapenem resistance VIM NOT DETECTED NOT DETECTED Final    Comment: Performed at [facility], [address], [place], [place] [address]  Resp panel by RT-PCR (RSV, Flu A&B, Covid) Anterior Nasal Swab     Status: None   Collection Time: 12/09/23 10:27 PM   Specimen: Anterior Nasal Swab  Result Value Ref Range Status   SARS Coronavirus 2 by RT PCR NEGATIVE NEGATIVE Final    Comment: (NOTE) SARS-CoV-2 target nucleic acids are NOT DETECTED.  The SARS-CoV-2 RNA is generally detectable in upper respiratory specimens during the acute phase of infection. The lowest concentration of SARS-CoV-2 viral copies this assay can detect is 138 copies/mL. A negative result does not preclude SARS-Cov-2 infection and should not be used as the sole basis for treatment or other patient management decisions. A negative result may occur with  improper specimen collection/handling, submission of specimen other than nasopharyngeal swab, presence of viral mutation(s) within the areas targeted by this assay, and inadequate number of viral copies(<138 copies/mL). A negative result must be combined with clinical observations,  patient history, and epidemiological information. The expected result is Negative.  Fact Sheet for Patients:  bloggercourse.com  Fact Sheet for Healthcare Providers:   seriousbroker.it  This test is no t yet approved or cleared by the [place]  FDA and  has been authorized for detection and/or diagnosis of SARS-CoV-2 by FDA under an Emergency Use Authorization (EUA). This EUA will remain  in effect (meaning this test can be used) for the duration of the COVID-19 declaration under Section 564(b)(1) of the Act, 21 U.S.C.section 360bbb-3(b)(1), unless the authorization is terminated  or revoked sooner.       Influenza A by PCR NEGATIVE NEGATIVE Final   Influenza B by PCR NEGATIVE NEGATIVE Final    Comment: (NOTE) The Xpert Xpress SARS-CoV-2/FLU/RSV plus assay is intended as an aid in the diagnosis of influenza from Nasopharyngeal swab specimens and should not be used as a sole basis for treatment. Nasal washings and aspirates are unacceptable for Xpert Xpress SARS-CoV-2/FLU/RSV testing.  Fact Sheet for Patients: bloggercourse.com  Fact Sheet for Healthcare Providers: seriousbroker.it  This test is not yet approved or cleared by the [place]  FDA and has been authorized for detection and/or diagnosis of SARS-CoV-2 by FDA under an Emergency Use Authorization (EUA). This EUA will remain in effect (meaning this test can be used) for the duration of the COVID-19 declaration under Section 564(b)(1) of the Act, 21 U.S.C. section 360bbb-3(b)(1), unless the authorization is terminated or revoked.     Resp Syncytial Virus by PCR NEGATIVE NEGATIVE Final    Comment: (NOTE) Fact Sheet for Patients: bloggercourse.com  Fact Sheet for Healthcare Providers: seriousbroker.it  This test is not yet approved or cleared by the [place]  FDA and has been authorized for detection and/or diagnosis of SARS-CoV-2 by FDA under an Emergency Use Authorization (EUA). This EUA will remain in effect (meaning this test can be used) for  the duration of the COVID-19 declaration under Section 564(b)(1) of the Act, 21 U.S.C. section 360bbb-3(b)(1), unless the authorization is terminated or revoked.  Performed at [facility], [address] [place], [place] [address]   Blood Culture (routine x 2)     Status: None (Preliminary result)   Collection Time: 12/09/23 10:30 PM   Specimen: BLOOD LEFT FOREARM  Result Value Ref Range Status   Specimen Description   Final    BLOOD LEFT FOREARM Performed at [facility], [address] [place], [place] [address]    Special Requests   Final    BOTTLES DRAWN AEROBIC AND ANAEROBIC Blood Culture adequate volume Performed at [facility], [address] [place], [place] [address]    Culture   Final    NO GROWTH 2 DAYS Performed at [facility], [address], [place], [place] [address]    Report Status PENDING  Incomplete     Radiology Studies: No results found.   Scheduled Meds:  aspirin  EC  81 mg Oral QPM   clopidogrel   75 mg Oral QPM   enoxaparin  (LOVENOX ) injection  40 mg Subcutaneous Q24H   insulin  pump   Subcutaneous TID WC, HS, 0200   [START ON 12/13/2023] levofloxacin   750 mg Oral Daily   levothyroxine   150 mcg Oral Q0600   pantoprazole   40 mg Oral QAC supper   tamsulosin   0.4 mg Oral QPM   Continuous Infusions:  meropenem  (MERREM ) IV 1 g (12/12/23 0952)     LOS: 2 days   Ivonne Mustache, MD [facility] P12/09/2023, 10:21 AM

## 2023-12-12 NOTE — Progress Notes (Signed)
 H@H  medic out for admission of this patient into H@H  program. Arriving on scene EMT Garrel, EMT Nat were on scene setting up [facility] equipment and finishing at the time of arrival. On assessment, lung sounds were clear and equal in all fields, abdomen soft non-tender x4 with active bowel sounds, heart sounds S1, S2, (R) leg +4 edema from knee to foot. Patient is missing (R) greater toe. Patient also has complaints of numbness in his feet from neuropathy. RN Darice was on for skin assessment. Evening medications were administered at this time with RN oversight. Pictures were taken of skin tear on (L) lateral upper arm and sent to the chart. Education was provided on how to take care of wound. All equipment was accounted for at the time of admission. No other tasks were needed at this time. Patient was left in care of his wife and daughter.

## 2023-12-12 NOTE — Progress Notes (Signed)
 RN transfer note. Patient transported home spouse at bedside. Skin assessed with medic see flowsheet. Upper left arm abrasion and left foot heel wound. No exudate.  Patient educated virtual meeting not recorded, when to contact us  or call [phone] red [phone]. Patient agreed to have area rug rolled up and stored as has Hx of falls.  Provider ordered interdry to keep groin wounds dry. All patient questions answered.

## 2023-12-12 NOTE — Plan of Care (Signed)
 Brief Narrative: Patient is a 73 year old male with history of type 1 diabetes on insulin  pump, diabetic neuropathy/retinopathy, charcoaled right foot, HFpEF, hypertension, hyperlipidemia, CKD stage III, CAD status post CABG, aortic stenosis status post bioprosthetic aortic valve replacement in 2017, right carotid stenosis status post stenting, CVA, GERD, hypothyroidism, OSA on CPAP, BPH who presented with right lower extremity warmth and erythema.  He was recently admitted for sepsis secondary to right lower extremity cellulitis.  Also had chills and fever at home.  Mentation, he was febrile, tachycardic but not hypotensive.  Lab work showed WC count of 15.2, creatinine of 1.5, lactic acid was normal.  Patient was admitted for the management of right lower extremity cellulitis.  Started on broad-spectrum antibiotics.  Blood cultures showed E. coli resistant to ceftriaxone .  Currently on meropenem .     Case was discussed with patient and wife over the phone at length. They are in agreement with the hospital at home program.    Assessment & Plan:   Principal Problem:   Cellulitis of right leg Active Problems:   Stage 3a chronic kidney disease (CKD) (HCC)   Sepsis (HCC)   Chronic heart failure with preserved ejection fraction (HFpEF) (HCC)   Edema of right lower extremity     Sepsis secondary to right lower EXTR cellulitis/gram negative bacteremia: Recently admitted for the same.  Presented with fever, chills, erythema, warmth of right lower extremity.  Has chronic right lower extremity edema.  Venous Doppler did not show any DVT. x-ray of the right leg negative for osteomyelitis.    Right lower leg extremity looks much better.  Leukocytosis much better .  Remains afebrile Blood cultures showed E. coli resistant to ceftriaxone .  Source of infection is most likely cellulitis.  Antibiotic changed to meropenem  today.  Case was discussed with ID pharmacist Damien Hua.  Plan to start on levofloxacin   from tomorrow with plan for 6 day course to complete treatment     AKI in CKD stage III: Baseline creatinine around 1.1.   Looks to be at baseline    Chronic HFpEF: Last echo on 10/11/2023 showed EF of 60 to 65%, grade 2 diastolic dysfunction. Overall euvolemic today   Type 1 diabetes: Recent A1c of 7.   Continue with home insulin  pump use    Hypertension: Currently normotensive.     Hyperlipidemia: On Repatha  injection   History of coronary artery disease/carotid stenosis/CVA: Status post CABG.  Had a stent for carotid artery stenosis.  Continue aspirin , Plavix  No active chest pain at present    GERD: Continue PPI   Hypothyroidism: Continue Synthyroid   OSA: On CPAP at night   History of BPH: Continue Flomax .   Normocytic  anemia: Currently hemoglobin stable   Debility/deconditioning: Patient ambulates with the help of cane.  Has poor ambulatory function.  PT consulted   Obesity: BMI of 30.5  After lengthy discussion with both patient and his wife via phone dialogue, both patient and wife are highly interested in hospital at home program.  Patient feels he has adequate support system at home including his wife.  Wife does not report any expressed concerns for the program at present.Consent obtained.  Hospital at Home Admission Criteria Checklist:  Formal consent explained in detail and signed at the bedside: yes Patient meets inpatient admission criteria (see below for further details) yes Is pt Medicare FFS/[employer] Medicare-Medicaid, [employer], [employer] Medicare, [employer]age, [employer] ( required for initial launch with plan to expand)? yes Lives within 25 mil/ 30  min from [facility] within [place](pt may stay with family member during admission who lives within 25 miles or 30 min from [facility]in [facility])? yes Hemodynamically stable with relatively low risk of clinical deterioration-not requiring ICU? yes Age >55? yes Does not require frequent touch-points  or complex interventions/medications (ie Titrated Infusions (IV insulin , heparin  drips, vasoactive drips, use of infused or injectable controlled substances, patients on insulin )? no Any Behavioral Health comorbidities likely to increase risk for in-home care (ie Acute delirium or experiencing a marked altered mental status and cause is not a treatable condition in the home)? no Has the patient been on BIPAP during course of ED evaluation or hospitalization? no IF YES, Has the patient been off of BIPAP for >24 hours(If NO-THEN PATIENT DOES MEET INCLUSION CRITERIA)? no On Room Air or Needs oxygen at home (<6L)? is not on home oxygen therapy. Active safety concerns (ie Unable to use bedside commode independently and lacks caregiver support for safety- needs SNF placement, unable to obtain IV access)? no Has skin check been performed? yes  Has Physical Therapy screened the patient? yes  Common admission diagnoses including: CAP, COPD Exacerbation, Acute on chronic heart failure, Cellulitis, UTI , dehydration, acute resp failure with hypoxia (requiring <5L)   Social Screening:  - Has the family been directly contacted about Hospital at Home program with consent obtained (if yes- please document who was spoken to with name and phone number)? yes  -Was the family approached about the use of TOC pharmacy for medications at discharge? yes Denies significant ETOH intake? not applicable Does not smoke and understands may not smoke in the presence of oxygen? not applicable Patient states able to use iPad/phone for communication/has family who is able to use? yes Patient has agreed to be compliant with medication and treatment regimen of the program? yes Any active drug use in patient or primary caregiver including daily dosing of methadone? no Stable home environment ( access to appropriate heating in cold conditions and/or appropriate air conditioning in hot conditions and/or no running water/electricity)?  yes  No aggressive pets at home? no Firearm present? yes  With ability or willingness to store them unloaded in a locked case for duration of hospitalization? yes Ambulatory? yes  mild difficulty Bed bugs present on home evaluation? no Family support system in place? yes Patient feels safe at home and does not endorse any violence? yes Any actively decompensated behavioral health issues including agitation/aggressive behavior? no  Patient requests food to be provided by hospital home program? yes PT/OT eval completed and not requiring SNF, ALF, inpatient rehab? yes  To be admitted to the Hospital at [facility] program, a patient generally must meet the following: 1. Requirement for Inpatient Level of Care: The patient's condition must necessitate an inpatient level of care. This is typically indicated by one or more of the following, depending on their specific diagnosis:  Persistent tachycardia despite appropriate treatment (e.g., for Heart Failure, UTI). Persistent tachypnea (rapid breathing) or dyspnea (shortness of breath) that hasn't improved sufficiently with observation care (e.g., for Heart Failure, Pneumonia, Viral Illness, COVID). Hypoxemia (low oxygen levels), such as a new need for oxygen, an increased need from baseline, or specific oxygen saturation levels (e.g., SpO2 <90-94% depending on the condition) that persist despite observation (e.g., for Heart Failure, COPD, Pneumonia, Viral Illness, COVID). Need for Intravenous (IV) hydration due to an inability to maintain oral hydration, which persists despite observation care (e.g., for Cellulitis, UTI, Viral Illness, COVID). Specific to Heart Failure:  Persistent pulmonary edema, indicated by a new oxygen need, lack of improvement with IV diuretics, and ongoing tachypnea/dyspnea. Specific to COPD: A decrease in known baseline resting oxygen saturation (SpO2) by 4% or more, or an increase in pre-existing supplemental oxygen requirements, which  persists despite observation and requires continued close monitoring. Specific to Pneumonia: A Pneumonia Severity Index (PSI) class IV (moderate risk). Specific to Cellulitis: Failure of outpatient antibiotic therapy (indicated by progression or no improvement after a minimum of 48 hours on an adequate regimen) or a clinical presentation (like acuity or rapidity of progression) that requires the intensity of monitoring found in an inpatient setting. Specific to UTI: Persistence or worsening of clinical findings like fever, pain, or dehydration despite observation care; presence of significant uropathy; suspected infection of an indwelling prosthetic device, stent, implant, or graft; or pregnancy with suspected pyelonephritis.  2. Appropriateness for Hospital at Home Setting: The patient's overall clinical picture, including the severity of their illness, their care needs, and their medical history and comorbidities, must be suitable for management in the Hospital at Home environment. This essentially means that none of the exclusion criteria (listed below) are met.  Unified Exclusion Criteria for Hospital at Home Admission: A patient would not be eligible for Hospital at Home if any of the following are present: Hemodynamic Instability: Hypotension (low blood pressure) is present. Respiratory Instability or Needs Beyond Program Capability: There is a new need for invasive or noninvasive ventilatory assistance (like BiPAP or a ventilator). Oxygenation is not sufficient, generally indicated if an FiO2 (fraction of inspired oxygen) of 45% (which is about 6 Liters/minute via nasal cannula) or more is required to keep oxygen saturation (SpO2) at 90% or greater. Monitoring or Procedural Needs Beyond Program Capability: There is a need for invasive monitoring, such as a pulmonary artery catheter or an arterial line. There is a need for immediate-response telemetry monitoring (for dangerous arrhythmia  detection and subsequent immediate intervention). The required medication regimen is beyond the capabilities of Hospital at Home (e.g., dosing intervals are too frequent for home administration). There is a need for a procedure that cannot be performed by the Hospital at [facility] team (e.g., significant wound debridement or abscess drainage for cellulitis, or percutaneous nephrostomy for a complicated UTI). Significant Organ Dysfunction or Markers of Severe Illness: Mental status is not at baseline, or there is altered mental status suggestive of inadequate perfusion. Renal (kidney) function is unstable or showing an ongoing decline. There is evidence of inadequate perfusion, such as metabolic acidosis or myocardial ischemia. Uncompensated acidosis is present. Condition-Specific Severity or Complications Making Home Care Unsuitable: For Heart Failure: Known severe cardiac valvular disease (e.g., aortic stenosis, mitral regurgitation); or severe peripheral edema that impairs the ability to urinate or ambulate. For COPD: Known concurrent comorbidity or finding that indicates a higher-risk COPD exacerbation (e.g., pulmonary fibrosis, cavitation, pleural effusion, pneumothorax, rib fracture). For Pneumonia: Pneumonia Severity Index (PSI) class V (indicating high risk for inpatient mortality); known concurrent comorbidity or finding that indicates higher-risk pneumonia (e.g., pulmonary fibrosis, cavitation, large or loculated pleural effusion); or a concomitant serious infectious process like endocarditis or empyema. For Cellulitis: Orbital, periorbital, or necrotizing infection is suspected; or a concomitant serious infectious process like endocarditis, septic emboli, or septic joint space infection. For UTI: Urinary tract obstruction (e.g., kidney stone, bladder outlet obstruction); or a concomitant serious infectious process like endocarditis or septic emboli. For Viral Illness & COVID-19: A concomitant  serious infectious process like endocarditis or empyema.  General Comorbidities or Status:  The patient is significantly immunosuppressed (this applies to Pneumonia, Cellulitis, UTI, Viral Illness, and COVID-19). The patient meets inpatient admission criteria for a second diagnosis, or has care needs beyond the capabilities of Hospital at Home due to an active clinically significant comorbidity. (This is a general exclusion across all listed conditions)

## 2023-12-12 NOTE — Progress Notes (Signed)
 This clinical research associate and EMT, Garrel arrived at [facility] [address] to  pick up patient. When walked into patients room, found patient laying in bed, fully dressed, bags packed, ready to go. This writer explained to the patient what was going to happen, patient was compliant. Patient stood up and sat in the wheelchair, patient was pushed to the wheelchair [place] with belongings. Patient had two bags, cell phone and a cane. Patient secured into wheelchair van via 2 front floor hooks, 2 wheelchair locks, 2 rear floor hooks and lap/shoulder seatbelt. Patient was transported home. This writer unloaded patient and placed into his recliner. This airline pilot up equipment, answered his and wife's questions, took vital signs, uploaded into the vital tab, took 3 photos of patients wounds and uploaded them into EPIC. Patient noted to have redness to right leg shin and open wound to his right heel. Paramedic Tyrell and Vikki came into finish up admission with the patient. Patient and wife thanked us  for what we had done for the patient. This clinical research associate and EMT left the patients residence. ------------------------------------------------------------------------ Nat SQUIBB. Franchot, EMT

## 2023-12-12 NOTE — Inpatient Diabetes Management (Signed)
 Inpatient Diabetes Program Recommendations  AACE/ADA: New Consensus Statement on Inpatient Glycemic Control (2015)  Target Ranges:  Prepandial:   less than 140 mg/dL      Peak postprandial:   less than 180 mg/dL (1-2 hours)      Critically ill patients:  140 - 180 mg/dL   Lab Results  Component Value Date   GLUCAP 152 (H) 12/12/2023   HGBA1C 7.0 (H) 10/23/2023    Review of Glycemic Control  Diabetes history: T1DM Outpatient Diabetes medications: T-slim Insulin  pump with Humalog  and Dexcom G7 Current orders for Inpatient glycemic control: Insulin  pump  HgbA1C - 7.0% Pump settings:  Insulin  pump settings: Basal: MN 0.4 0500 - 1.1 2200 - 0.4 CF - 40 CHO ratio 1:6  Inpatient Diabetes Program Recommendations:    Continue with insulin  pump managed by pt. Contract signed.   Continue to follow.  Thank you. Shona Brandy, RD, LDN, CDCES Inpatient Diabetes Coordinator [phone]
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
## 2023-12-12 NOTE — Progress Notes (Signed)
 This clinical research associate, Garrel, EMT and Dr. Eldonna went to assess patient. Found patient in his room, 1403 at [facility], sitting on his bed, talking on his cell phone with his wife. This clinical research associate spoke to the patient, explaining who I was and about our [facility] program. The patients wife, Nena stayed on the phone to listen. This clinical research associate verified patients address, phone number, birth date, wife's name and phone number, allergies, medications. Patient is A&O x4. Patient states he lives with wife in a two story house, with a ground level entrance, also has an elevator to access the second floor if needed. Patient stated he has 1 German Sheppard dog, he is able to put the dog in a crate or put him outside in the back yard when the team does visits. The patient asks for us  to provide a couple meals. Patient states he does have firearms that are secured in a safe. This writer has secured his shadow chart and the Insulin  Pump Therapy signed consent. The patients medications were verified with Dr. Eldonna and the team. Patient and wife concerned his Insulin  Pump only has 30% left, they asked if he could be transported before it runs out? This writer expressed we would do our best, still waiting for the patient to be assessed by PT at this time. Patient and wife had no further questions for the team. Upon leaving the patients room, this writer approached the nurse to enquire if she had any questions or concerns? The supervisor asked the process, it was explained to her and was happy to see the patient able to go home and recover. This clinical research associate and team left the facility. ---------------------------------------------------------------------------------------------------- Nat SQUIBB. Franchot, EMT

## 2023-12-12 NOTE — Care Management Important Message (Signed)
 Important Message  Patient Details IM Letter given. Name: FERNANDEZ KENLEY MRN: [id number] Date of Birth: 24-Jun-1950   Important Message Given:  Yes - Medicare IM     Melba Ates 12/12/2023, 1:24 PM

## 2023-12-13 DIAGNOSIS — N1831 Chronic kidney disease, stage 3a: Secondary | ICD-10-CM

## 2023-12-13 DIAGNOSIS — R600 Localized edema: Secondary | ICD-10-CM

## 2023-12-13 DIAGNOSIS — Z6830 Body mass index (BMI) 30.0-30.9, adult: Secondary | ICD-10-CM

## 2023-12-13 DIAGNOSIS — A419 Sepsis, unspecified organism: Principal | ICD-10-CM

## 2023-12-13 DIAGNOSIS — N179 Acute kidney failure, unspecified: Secondary | ICD-10-CM

## 2023-12-13 DIAGNOSIS — E669 Obesity, unspecified: Secondary | ICD-10-CM

## 2023-12-13 DIAGNOSIS — I129 Hypertensive chronic kidney disease with stage 1 through stage 4 chronic kidney disease, or unspecified chronic kidney disease: Secondary | ICD-10-CM

## 2023-12-13 NOTE — Plan of Care (Signed)
  Problem: Clinical Measurements: Goal: Will remain free from infection Outcome: Progressing   Problem: Clinical Measurements: Goal: Ability to maintain clinical measurements within normal limits will improve Outcome: Progressing   Problem: Nutrition: Goal: Adequate nutrition will be maintained Outcome: Progressing   Problem: Elimination: Goal: Will not experience complications related to bowel motility Outcome: Progressing Goal: Will not experience complications related to urinary retention Outcome: Progressing   Problem: Pain Managment: Goal: General experience of comfort will improve and/or be controlled Outcome: Progressing   Problem: Safety: Goal: Ability to remain free from injury will improve Outcome: Progressing   Problem: Skin Integrity: Goal: Risk for impaired skin integrity will decrease Outcome: Progressing

## 2023-12-13 NOTE — Progress Notes (Signed)
 Witnessed the patient taking synthroid  via video tablet. He states he had a good night. No complaints of pain or discomfort. Plan of care reviewed with patient.  Plan for paramedic visit between 1030-1130 today.

## 2023-12-13 NOTE — Progress Notes (Signed)
 Hospital at Home Daily Progress Note   Patient: [name]  MRN: [id number]  DOB: 07-01-1950  DOA: 12/09/2023  DOS: the patient was seen and examined on @TODAY @    Patient identified themself as [name]  DOB 09-17-1950  Medic Lauraine Faes present in the home during encounter and performed the assessment and physical exam. Patient was seen today via video conference; my physical location [place], [place]    Brief hospital course:  73 year old male with history of type 1 diabetes on insulin  pump, diabetic neuropathy/retinopathy, Charcot right foot, HFpEF, hypertension, hyperlipidemia, CKD stage IIIa, CAD status post CABG, aortic stenosis status post bioprosthetic aortic valve replacement in 2017, right carotid stenosis status post stenting, CVA, GERD, hypothyroidism, OSA on CPAP, BPH.  He presented to [facility] ED on evening of 12/09/23 for evaluation of right lower extremity warmth and erythema, fever and chills. Recently admitted to [facility] health system 10/20-10/23/2025 for sepsis secondary to right lower extremity cellulitis. He reported having associated chills and fever at home.   In the ED, pt met sepsis criteria with fever, tachycardia, leukocytosis. He was not hypotensive and lactic acid was normal.   Patient was admitted to the hospital for further evaluation and management of right lower extremity cellulitis as outlined in detail below. He was started initially on broad-spectrum IV antibiotics.    CC: right leg redness, swelling, fever & chills  HPI:  73 year old male with history of type 1 diabetes on insulin  pump, diabetic neuropathy/retinopathy, Charcot right foot, HFpEF, hypertension, hyperlipidemia, CKD stage IIIa, CAD status post CABG, aortic stenosis status post bioprosthetic aortic valve replacement in 2017, right carotid stenosis status post stenting, CVA, GERD, hypothyroidism, OSA on CPAP, BPH.  He presented to [facility] ED on evening of 12/09/23 for  evaluation of right lower extremity warmth and erythema, fever and chills. Recently admitted to [facility] health system 10/20-10/23/2025 for sepsis secondary to right lower extremity cellulitis. He reported having associated chills and fever at home.   In the ED, pt met sepsis criteria with fever, tachycardia, leukocytosis. He was not hypotensive and lactic acid was normal.   Patient was admitted to the hospital for further evaluation and management of right lower extremity cellulitis as outlined in detail below. He was started initially on broad-spectrum IV antibiotics.   Assessment and Plan:  Sepsis secondary to right lower EXTR cellulitis/gram negative bacteremia: Recently admitted for the same.  Presented with fever, chills, erythema, warmth of right lower extremity.  Has chronic right lower extremity edema.  Venous Doppler did not show any DVT.  X-ray right leg negative for osteomyelitis.     Blood cultures showed E. coli resistant to ceftriaxone .  Source of infection is most likely cellulitis.   12/9 -- Right lower leg extremity looks much better.  Leukocytosis much better .  Remains afebrile.  Antibiotic changed to meropenem  today.  Case was discussed with ID pharmacist Damien Quiet with plan to transition to PO Levaquin  x 6 day course to complete treatment.   12/10  PLAN: -- Continue PO Levaquin  --Monitor RLE exam for improvement --Elevate leg --Monitor fever curve, CBC    AKI in CKD stage III: Baseline creatinine around 1.1.  Initial Cr on admission 1.58 >> 1.32 >> 1.22. AKI improved. --Monitor BMP --Losartan  and hydrochlorothiazide are on hold   Chronic HFpEF: Last echo on 10/11/2023 showed EF of 60 to 65%, grade 2 diastolic dysfunction. Overall euvolemic on exam with exception of RLE edema with cellulitis.  No LLE edema on exam,  no dyspnea reported. --Monitor volume status   Type 1 diabetes: Recent A1c of 7.   --Continue with home insulin  pump use    Hypertension: BP's remain  stable.   --Holding home losartan , hydrochlorothiazide  --Monitor BP and resume when indicated   Hyperlipidemia: On Repatha  injection   History of coronary artery disease/carotid stenosis/CVA: Status post CABG.  Had a stent for carotid artery stenosis.  Stable with no active or recent chest pain reported. --Continue aspirin , Plavix    GERD: Continue PPI   Hypothyroidism: Continue Synthyroid   OSA: On CPAP at night   History of BPH: Continue Flomax .   Normocytic  anemia: Currently hemoglobin stable.  Last Hbg 10.5 --Monitor CBC   Debility/deconditioning: Patient ambulates with the help of cane.  Has poor ambulatory function.  Evaluated by PT in the hospital - recommended resuming outpatient PT after d/c. --Encourage mobility    Obesity: BMI of 30.5 Complicates overall care and prognosis.  Recommend lifestyle modifications including physical activity and diet for weight loss and overall long-term health.       Patient Active Problem List   Diagnosis Date Noted   Cellulitis 10/23/2023    Priority: 1.   Chronic heart failure with preserved ejection fraction (HFpEF) (HCC) 12/10/2023   Edema of right lower extremity 12/10/2023   Cellulitis of right leg 12/09/2023   Sepsis (HCC) 10/25/2023   Diabetic infection of right foot (HCC) 07/07/2022   Acute mycotic otitis externa 08/05/2020   Normocytic anemia 11/21/2018   Rhegmatogenous retinal detachment of right eye 10/12/2018   Stage 3a chronic kidney disease (CKD) (HCC) 06/19/2018   Laceration of elbow 04/30/2018   Injury, superficial, elbow, forearm, or wrist with infection 04/16/2018   Superficial injury of elbow, forearm and wrist 04/16/2018   Colonoscopy refused 06/13/2017   Postoperative hypothyroidism 04/12/2017   Statin intolerance 01/18/2017   S/P CABG x 3 07/14/2015   S/P AVR 07/09/2015   Proliferative diabetic retinopathy of left eye without macular edema associated with type 1 diabetes mellitus (HCC) 05/15/2015    Numbness and tingling of right arm and leg 05/06/2015   Polypharmacy 04/09/2014   Carotid occlusion, right 01/16/2014   Carotid stenosis    Stroke [facility])    TIA (transient ischemic attack) 01/12/2014   Pre-syncope 05/13/2013   Essential hypertension 03/18/2013   Trochanteric bursitis of left hip 01/18/2013   Preventative health care 01/18/2013   OSA (obstructive sleep apnea)    ED (erectile dysfunction) of organic origin 07/11/2012   Hypertrophy of prostate without urinary obstruction and other lower urinary tract symptoms (LUTS) 07/11/2012   Psychosexual dysfunction with inhibited sexual excitement 07/11/2012   Mild cognitive impairment 05/09/2012   Poorly controlled type 1 diabetes mellitus with circulatory disorder (HCC) 05/09/2012   CAD (coronary artery disease) 01/11/2012   Hx of vitrectomy 12/20/2011   Abnormal nuclear cardiac imaging test, 10/2011 10/28/2011   PAD (peripheral artery disease) 09/28/2011   Disequilibrium syndrome 09/28/2011   Aortic stenosis, moderate 09/28/2011   Carotid stenosis, right 09/28/2011   Colon cancer screening 04/21/2011   Fatigue 04/21/2011   Hyperlipidemia 03/21/2011   Diabetic peripheral neuropathy (HCC) 01/19/2011   Restless legs syndrome 01/19/2011   Hypothyroidism 01/13/2011   Presence of intraocular lens 12/13/2010      Significant Events: Admitted 12/09/2023 Transferred to [facility] 12/12/23. 12/9 -- antibiotic changed from ceftriaxone  >> meropenem  based on culture sensitivity results 12/10 -- transitioned to PO Levaquin     Subjective / Interval 24 hour History:   Patient  seen virtually during AM Medic visit to the home today.  Wife was present in the room as well.  Pt reports feeling overall better.  His leg still looks red to him, but does not feel as hot.  He reports some chills overnight, despite under usual blankets in bed, had to get up and put on a sweatshirt for warmth.  Woke up hot later and removed it.  He asks about  recurrent cellultits, given also admitted for same in October, and how to prevent it.  He discusses being seen by Vascular surgery in the past for circulation evaluation, and having history of multiple surgeries on the right leg that have impacted his circulation down that leg.  He does report improved appetite since initially being admitted.  No other acute complaints or issues reported.     Admission Labs: WBC 15.2 Lactic acid 1.7 >> 1.6 normal Cr 1.58 Glucose 200 Negative UA for infection BLOOD CULTURE + E. Coli -- resistant to ampicillin , ceftriaxone , cefazolin  and intermediate to Unasyn .  Sensitive to cefepime , carbapenems and fluoroquinolones.  Admission Imaging Studies: Right foot x-ray - soft tissue ulcer at calcaneus with diffuse soft tissue swelling, no signs of osteomyelitis.  (Other chronic finds in report) Right tib/fib x-ray -- healed tibial mid-diaphyseal facture in intact intramedullary nail & screw fixation Chest x-ray -- no acute findings. Cardiomegaly and s/p CABG.   Significant Labs: WBC trend: 15.2 >> 18.6 >> 10.6 Cr trend: 1.58 >> 1.32 >> 1.22 Hbg trend: 12.5 >> 11.2 >> 10.5  Significant Imaging Studies: Venous doppler U/S right lower extremity -- negative for DVT   Antibiotic Therapy: Anti-infectives (From admission, onward)    Start     Dose/Rate Route Frequency Ordered Stop   12/13/23 1200  levofloxacin  (LEVAQUIN ) tablet 750 mg        750 mg Oral Daily 12/12/23 0925 12/19/23 0959   12/12/23 1830  meropenem  (MERREM ) 1 g in sodium chloride  0.9 % 100 mL IVPB  Status:  Discontinued        1 g 200 mL/hr over 30 Minutes Intravenous Every 8 hours 12/12/23 1428 12/12/23 1512   12/12/23 1800  ertapenem  (INVANZ ) 1 g in sodium chloride  0.9 % 100 mL IVPB        1 g 200 mL/hr over 30 Minutes Intravenous  Once 12/12/23 1512 12/12/23 2329   12/12/23 1030  meropenem  (MERREM ) 1 g in sodium chloride  0.9 % 100 mL IVPB  Status:  Discontinued        1 g 200 mL/hr over 30  Minutes Intravenous Every 8 hours 12/12/23 0923 12/12/23 0925   12/12/23 1030  meropenem  (MERREM ) 1 g in sodium chloride  0.9 % 100 mL IVPB  Status:  Discontinued        1 g 200 mL/hr over 30 Minutes Intravenous Every 8 hours 12/12/23 0925 12/12/23 1428   12/11/23 0200  cefTRIAXone  (ROCEPHIN ) 2 g in sodium chloride  0.9 % 100 mL IVPB  Status:  Discontinued        2 g 200 mL/hr over 30 Minutes Intravenous Every 24 hours 12/11/23 0013 12/12/23 0923   12/11/23 0000  vancomycin  (VANCOREADY) IVPB 1500 mg/300 mL  Status:  Discontinued        1,500 mg 150 mL/hr over 120 Minutes Intravenous Every 24 hours 12/10/23 0302 12/11/23 0013   12/10/23 1000  metroNIDAZOLE  (FLAGYL ) IVPB 500 mg  Status:  Discontinued        500 mg 100 mL/hr over 60 Minutes Intravenous Every 12  hours 12/10/23 0246 12/10/23 1053   12/10/23 1000  ceFEPIme  (MAXIPIME ) 2 g in sodium chloride  0.9 % 100 mL IVPB  Status:  Discontinued        2 g 200 mL/hr over 30 Minutes Intravenous Every 12 hours 12/10/23 0302 12/10/23 0921   12/10/23 1000  ceFEPIme  (MAXIPIME ) 2 g in sodium chloride  0.9 % 100 mL IVPB  Status:  Discontinued        2 g 200 mL/hr over 30 Minutes Intravenous Every 8 hours 12/10/23 0921 12/11/23 0013   12/10/23 0000  vancomycin  (VANCOCIN ) IVPB 1000 mg/200 mL premix       Placed in Followed by Linked Group   1,000 mg 200 mL/hr over 60 Minutes Intravenous  Once 12/09/23 2217 12/10/23 1026   12/09/23 2300  vancomycin  (VANCOCIN ) IVPB 1000 mg/200 mL premix       Placed in Followed by Linked Group   1,000 mg 200 mL/hr over 60 Minutes Intravenous  Once 12/09/23 2217 12/10/23 0008   12/09/23 2230  ceFEPIme  (MAXIPIME ) 2 g in sodium chloride  0.9 % 100 mL IVPB        2 g 200 mL/hr over 30 Minutes Intravenous  Once 12/09/23 2215 12/09/23 2308   12/09/23 2230  metroNIDAZOLE  (FLAGYL ) IVPB 500 mg        500 mg 100 mL/hr over 60 Minutes Intravenous  Once 12/09/23 2215 12/10/23 0008   12/09/23 2230  vancomycin  (VANCOCIN ) IVPB  1000 mg/200 mL premix  Status:  Discontinued        1,000 mg 200 mL/hr over 60 Minutes Intravenous  Once 12/09/23 2215 12/09/23 2216       Procedures: None  Consultants: None          Physical Exam:    12/13/2023   11:00 AM 12/12/2023    4:06 PM 12/12/2023    1:55 PM  Vitals with BMI  Weight 232 lbs 3 oz    BMI 29.8    Systolic 136 156 879  Diastolic 72 71 61  Pulse 81 82 75      General exam: awake, alert, no acute distress HEENT: hearing grossly normal, voice clear Respiratory system: CTAB, no wheezes, rales or rhonchi, normal respiratory effort. Cardiovascular system: normal S1/S2, RRR, No LLE edema, no JVD, cap refill < 3 sec, 4+ RLE edema, 2+ bilateral radial pulses Gastrointestinal system: soft, nondistended Central nervous system: A&O x 4. no gross focal neurologic deficits, normal speech Extremities: RLE images below -- erythema persistent but no differential warmth on palpation today, left great toe amputation Skin: RLE image below Psychiatry: normal mood, congruent affect, judgement and insight appear normal          Data Reviewed:  Labs & diagnostic studies as reviewed in detail above.   Family Communication: Wife present in the room during virtual encounter this morning.  Disposition: Status is: Inpatient Remains inpatient appropriate because: warrants close monitoring and further clinical improvement in cellulitis due to persistent chills and RLE erythema.   Planned Discharge Destination: Home    Time spent: 45 minutes  Author: Burnard DELENA Cunning, DO [facility]  08/24/2023 7:00 AM  For on call review [email or web address].

## 2023-12-13 NOTE — Assessment & Plan Note (Signed)
 Recurrent, pt was admitted Oct 20-23 this year for same.  Does have known PAD and a right heel ulcer.  No signs of underlying osteomyelitis on x-rays.

## 2023-12-13 NOTE — Assessment & Plan Note (Signed)
 History of critical limb ischemia of the RLE, status post angiogram with Vascular Surgery in Feb 2024.  Appears no re-vascularization was performed on mid-calf occlusion of the AT and PT due to robust collateral flow down to the ankle.

## 2023-12-13 NOTE — Assessment & Plan Note (Signed)
 Pt met sepsis on admission with fever, tachycardia and leukocytosis in the setting of RLE cellulitis and subsequently found infection complicated by E coli bacteremia. Lactic acid was normal x 2.  Sepsis physiology has improved. --Monitor fever curve, CBC --Continue antibiotics as outlined

## 2023-12-13 NOTE — Progress Notes (Signed)
 Virtual visit with [name]. He is in good spirits. He has no concerns and is awaiting the team to come to the home for his IV antibiotic administration.

## 2023-12-13 NOTE — Progress Notes (Addendum)
 1958----Video Call - RN introduction, Patient identifiers completed.  Patient sitting up in the chair.  Patient A&O x4. The patient currently denies pain or worsening symptoms related to Cellulitis. Patient is on room air and confirmed use of a CPAP at night and will wear at bedtime tonight.  No scheduled medications due, PRN medication was reviewed, patient confirm no PRN medication needed at the time of the call.  Assessment completed.  All questions answered. Patient informed Virtual RN will continue to monitor current health data overnight; patient encouraged to call as needed. [facility] contact information reviewed.

## 2023-12-13 NOTE — Progress Notes (Signed)
 Pt had routine [facility] visit. Pt appears well and states he slept much better than he did in the hospital. Pt denies any pain or complaints.  Vital signs and assessment obtained as noted. Photos of Right leg and wound on Right heel uploaded to media tab.  Wound cleansed with saline and heel Mepilex applied.   IV care completed including new Curos cap.   Medications administered as noted.  Pt had virtual visit with RN and Dr. Fausto.   I told pt I will be back this afternoon for 2nd visit.

## 2023-12-13 NOTE — Hospital Course (Addendum)
 73 year old male with history of type 1 diabetes on insulin  pump, diabetic neuropathy/retinopathy, Charcot right foot, HFpEF, hypertension, hyperlipidemia, CKD stage IIIa, CAD status post CABG, aortic stenosis status post bioprosthetic aortic valve replacement in 2017, right carotid stenosis status post stenting, CVA, GERD, hypothyroidism, OSA on CPAP, BPH.  He presented to [facility] ED on evening of 12/09/23 for evaluation of right lower extremity warmth and erythema, fever and chills. Recently admitted to [facility] system 10/20-10/23/2025 for sepsis secondary to right lower extremity cellulitis. He reported having associated chills and fever at home.   In the ED, pt met sepsis criteria with fever, tachycardia, leukocytosis. He was not hypotensive and lactic acid was normal.   Patient was admitted to the hospital for further evaluation and management of right lower extremity cellulitis as outlined in detail below. He was started initially on broad-spectrum IV antibiotics.

## 2023-12-13 NOTE — Plan of Care (Signed)

## 2023-12-14 LAB — BASIC METABOLIC PANEL WITH GFR
Anion gap: 5 (ref 5–15)
BUN: 15 mg/dL (ref 8–23)
CO2: 28 mmol/L (ref 22–32)
Calcium: 8.4 mg/dL — ABNORMAL LOW (ref 8.9–10.3)
Chloride: 102 mmol/L (ref 98–111)
Creatinine, Ser: 1.07 mg/dL (ref 0.61–1.24)
GFR, Estimated: >60 mL/min (ref >60)
Glucose, Bld: 197 mg/dL — ABNORMAL HIGH (ref 70–99)
Potassium: 5 mmol/L (ref 3.5–5.1)
Sodium: 135 mmol/L (ref 135–145)

## 2023-12-14 LAB — CBC
HCT: 35.8 % — ABNORMAL LOW (ref 39.0–52.0)
Hemoglobin: 12.2 g/dL — ABNORMAL LOW (ref 13.0–17.0)
MCH: 31.3 pg (ref 26.0–34.0)
MCHC: 34.1 g/dL (ref 30.0–36.0)
MCV: 91.8 fL (ref 80.0–100.0)
Platelets: 210 K/uL (ref 150–400)
RBC: 3.9 MIL/uL — ABNORMAL LOW (ref 4.22–5.81)
RDW: 12.5 % (ref 11.5–15.5)
WBC: 6.7 K/uL (ref 4.0–10.5)
nRBC: 0 % (ref 0.0–0.2)

## 2023-12-14 MED ORDER — HYDROCHLOROTHIAZIDE 12.5 MG PO TABS
12.5000 mg | ORAL_TABLET | Freq: Every evening | ORAL | Status: DC
  Filled 2023-12-14: qty 1

## 2023-12-14 MED ORDER — HYDROCHLOROTHIAZIDE 25 MG PO TABS
25.0000 mg | ORAL_TABLET | Freq: Every evening | ORAL | Status: DC
  Administered 2023-12-14: 25 mg via ORAL
  Filled 2023-12-14 (×2): qty 1

## 2023-12-14 MED ORDER — LOSARTAN POTASSIUM 25 MG PO TABS
25.0000 mg | ORAL_TABLET | Freq: Every day | ORAL | Status: DC
  Administered 2023-12-14: 25 mg via ORAL
  Filled 2023-12-14 (×2): qty 1

## 2023-12-14 NOTE — Plan of Care (Signed)
°  Problem: Education: Goal: Knowledge of General Education information will improve Description: Including pain rating scale, medication(s)/side effects and non-pharmacologic comfort measures Outcome: Progressing   Problem: Health Behavior/Discharge Planning: Goal: Ability to manage health-related needs will improve Outcome: Progressing   Problem: Clinical Measurements: Goal: Ability to maintain clinical measurements within normal limits will improve Outcome: Progressing Goal: Diagnostic test results will improve Outcome: Progressing Goal: Cardiovascular complication will be avoided Outcome: Progressing   Problem: Activity: Goal: Risk for activity intolerance will decrease Outcome: Progressing   Problem: Nutrition: Goal: Adequate nutrition will be maintained Outcome: Progressing   Problem: Skin Integrity: Goal: Risk for impaired skin integrity will decrease Outcome: Progressing   Problem: Clinical Measurements: Goal: Ability to avoid or minimize complications of infection will improve Outcome: Progressing   Problem: Skin Integrity: Goal: Skin integrity will improve Outcome: Progressing

## 2023-12-14 NOTE — Progress Notes (Signed)
 Completed virtual rounds with MD,paramedic at patient bedside. POC reviewed and discussed ,patient voices understanding and agreement. Pt reminded to call RN for any needs, RN and MD available at all times. Pt voices understanding. Pt aware of next planned visit and next call from RN.

## 2023-12-14 NOTE — Progress Notes (Addendum)
 "  Hospital at Home Daily Progress Note   Patient: [name]  MRN: [id number]  DOB: 1950/10/15  DOA: 12/09/2023  DOS: the patient was seen and examined on @TODAY @    Patient identified themself as [name]  DOB Dec 17, 1950  Medic Barnie Bud present in the home during encounter and performed the assessment and physical exam. Patient was seen today via video conference; my physical location [place], [place]    Brief hospital course:  73 year old male with history of type 1 diabetes on insulin  pump, diabetic neuropathy/retinopathy, Charcot right foot, HFpEF, hypertension, hyperlipidemia, CKD stage IIIa, CAD status post CABG, aortic stenosis status post bioprosthetic aortic valve replacement in 2017, right carotid stenosis status post stenting, CVA, GERD, hypothyroidism, OSA on CPAP, BPH.  He presented to [facility] ED on evening of 12/09/23 for evaluation of right lower extremity warmth and erythema, fever and chills. Recently admitted to [facility] health system 10/20-10/23/2025 for sepsis secondary to right lower extremity cellulitis. He reported having associated chills and fever at home.   In the ED, pt met sepsis criteria with fever, tachycardia, leukocytosis. He was not hypotensive and lactic acid was normal.   Patient was admitted to the hospital for further evaluation and management of right lower extremity cellulitis as outlined in detail below. He was started initially on broad-spectrum IV antibiotics.    CC: right leg redness, swelling, fever & chills  HPI:  73 year old male with history of type 1 diabetes on insulin  pump, diabetic neuropathy/retinopathy, Charcot right foot, HFpEF, hypertension, hyperlipidemia, CKD stage IIIa, CAD status post CABG, aortic stenosis status post bioprosthetic aortic valve replacement in 2017, right carotid stenosis status post stenting, CVA, GERD, hypothyroidism, OSA on CPAP, BPH.  He presented to [facility] ED on evening of 12/09/23 for  evaluation of right lower extremity warmth and erythema, fever and chills. Recently admitted to [facility] health system 10/20-10/23/2025 for sepsis secondary to right lower extremity cellulitis. He reported having associated chills and fever at home.   In the ED, pt met sepsis criteria with fever, tachycardia, leukocytosis. He was not hypotensive and lactic acid was normal.   Patient was admitted to the hospital for further evaluation and management of right lower extremity cellulitis as outlined in detail below. He was started initially on broad-spectrum IV antibiotics.   Assessment and Plan:  Sepsis secondary to right lower EXTR cellulitis/gram negative bacteremia: Recently admitted for the same.  Presented with fever, chills, erythema, warmth of right lower extremity.  Has chronic right lower extremity edema.  Venous Doppler did not show any DVT.  X-ray right leg negative for osteomyelitis.     Blood cultures showed E. coli resistant to ceftriaxone .  Source of infection is most likely cellulitis.   12/9 -- Right lower leg extremity looks much better.  Leukocytosis much better .  Remains afebrile.  Antibiotic changed to meropenem  today.  Case was discussed with ID pharmacist Damien Quiet with plan to transition to PO Levaquin  x 6 day course to complete treatment.   12/11 PLAN: -- Continue PO Levaquin  --Monitor RLE exam for improvement --Elevate leg --Monitor fever curve, CBC --Wound care to right heel ulcer   AKI in CKD stage III: Baseline creatinine around 1.1.  Initial Cr on admission 1.58 >> 1.32 >> 1.22. AKI Resolved. --Monitor BMP --Losartan  and hydrochlorothiazide  were held, resuming today given BP elevated and AKI now resolved   Chronic HFpEF: Last echo on 10/11/2023 showed EF of 60 to 65%, grade 2 diastolic dysfunction. Overall euvolemic on exam  with exception of RLE edema with cellulitis.  No LLE edema on exam, no dyspnea reported. --Monitor volume status   Type 1 diabetes: Recent  A1c of 7.   --Continue with home insulin  pump use    Hypertension: BP's have been stable, mildly elevated this AM 141/80.SABRA   --Resume home losartan , hydrochlorothiazide   --Monitor BP closely   Hyperlipidemia: On Repatha  injection   History of coronary artery disease/carotid stenosis/CVA: Status post CABG.  Had a stent for carotid artery stenosis.  Stable with no active or recent chest pain reported. --Continue aspirin , Plavix    GERD: Continue PPI   Hypothyroidism: Continue Synthyroid   OSA: On CPAP at night   History of BPH: Continue Flomax .   Normocytic  anemia: Currently hemoglobin stable.  Last Hbg 10.5 --Monitor CBC   Debility/deconditioning: Patient ambulates with the help of cane.  Has poor ambulatory function.  Evaluated by PT in the hospital - recommended resuming outpatient PT after d/c. --Encourage mobility    Obesity: BMI of 30.5 Complicates overall care and prognosis.  Recommend lifestyle modifications including physical activity and diet for weight loss and overall long-term health.       Patient Active Problem List   Diagnosis Date Noted   Sepsis (HCC) 10/25/2023    Priority: High   Cellulitis 10/23/2023    Priority: 1.   Chronic heart failure with preserved ejection fraction (HFpEF) (HCC) 12/10/2023   Edema of right lower extremity 12/10/2023   Cellulitis of right leg 12/09/2023   Diabetic infection of right foot (HCC) 07/07/2022   Acute mycotic otitis externa 08/05/2020   Normocytic anemia 11/21/2018   Rhegmatogenous retinal detachment of right eye 10/12/2018   Stage 3a chronic kidney disease (CKD) (HCC) 06/19/2018   Laceration of elbow 04/30/2018   Injury, superficial, elbow, forearm, or wrist with infection 04/16/2018   Superficial injury of elbow, forearm and wrist 04/16/2018   Colonoscopy refused 06/13/2017   Postoperative hypothyroidism 04/12/2017   Statin intolerance 01/18/2017   S/P CABG x 3 07/14/2015   S/P AVR 07/09/2015   Proliferative  diabetic retinopathy of left eye without macular edema associated with type 1 diabetes mellitus (HCC) 05/15/2015   Numbness and tingling of right arm and leg 05/06/2015   Polypharmacy 04/09/2014   Carotid occlusion, right 01/16/2014   Carotid stenosis    Stroke [facility])    TIA (transient ischemic attack) 01/12/2014   Pre-syncope 05/13/2013   Essential hypertension 03/18/2013   Trochanteric bursitis of left hip 01/18/2013   Preventative health care 01/18/2013   OSA (obstructive sleep apnea)    ED (erectile dysfunction) of organic origin 07/11/2012   Hypertrophy of prostate without urinary obstruction and other lower urinary tract symptoms (LUTS) 07/11/2012   Psychosexual dysfunction with inhibited sexual excitement 07/11/2012   Mild cognitive impairment 05/09/2012   Poorly controlled type 1 diabetes mellitus with circulatory disorder (HCC) 05/09/2012   CAD (coronary artery disease) 01/11/2012   Hx of vitrectomy 12/20/2011   Abnormal nuclear cardiac imaging test, 10/2011 10/28/2011   PAD (peripheral artery disease) 09/28/2011   Disequilibrium syndrome 09/28/2011   Aortic stenosis, moderate 09/28/2011   Carotid stenosis, right 09/28/2011   Colon cancer screening 04/21/2011   Fatigue 04/21/2011   Hyperlipidemia 03/21/2011   Diabetic peripheral neuropathy (HCC) 01/19/2011   Restless legs syndrome 01/19/2011   Hypothyroidism 01/13/2011   Presence of intraocular lens 12/13/2010      Significant Events: Admitted 12/09/2023 Transferred to [facility] 12/12/23. 12/9 -- antibiotic changed from ceftriaxone  (resistant) >> meropenem  based  on culture sensitivities 12/10 -- transitioned to PO Levaquin   12/11 - day 6 total abx / day 3 of continuous culture-appropriate (12/8 got only rocephin , 2 prior days cefepime  covered appropriately)    Subjective / Interval 24 hour History:   Patient seen virtually during AM Medic visit to the home today.  Wife was present in the room as well.  He  reports feeling okay overall, right leg looks about the same in redness but not as thickened as it felt before.  The leg doesn't feel as warm.  No chills or fever overnight.  Overall feels about 80% better, improved appetite.      Admission Labs: WBC 15.2 Lactic acid 1.7 >> 1.6 normal Cr 1.58 Glucose 200 Negative UA for infection BLOOD CULTURE + E. Coli -- resistant to ampicillin , ceftriaxone , cefazolin  and intermediate to Unasyn .  Sensitive to cefepime , carbapenems and fluoroquinolones.  Admission Imaging Studies: Right foot x-ray - soft tissue ulcer at calcaneus with diffuse soft tissue swelling, no signs of osteomyelitis.  (Other chronic finds in report) Right tib/fib x-ray -- healed tibial mid-diaphyseal facture in intact intramedullary nail & screw fixation Chest x-ray -- no acute findings. Cardiomegaly and s/p CABG.   Significant Labs: WBC trend: 15.2 >> 18.6 >> 10.6 >> 6.7 normalized 12/11 Cr trend: 1.58 >> 1.32 >> 1.22 >> 1.07 Hbg trend: 12.5 >> 11.2 >> 10.5 >> 12.2  Significant Imaging Studies: Venous doppler U/S right lower extremity -- negative for DVT   Antibiotic Therapy: Anti-infectives (From admission, onward)    Start     Dose/Rate Route Frequency Ordered Stop   12/13/23 1200  levofloxacin  (LEVAQUIN ) tablet 750 mg        750 mg Oral Daily 12/12/23 0925 12/19/23 0959   12/12/23 1830  meropenem  (MERREM ) 1 g in sodium chloride  0.9 % 100 mL IVPB  Status:  Discontinued        1 g 200 mL/hr over 30 Minutes Intravenous Every 8 hours 12/12/23 1428 12/12/23 1512   12/12/23 1800  ertapenem  (INVANZ ) 1 g in sodium chloride  0.9 % 100 mL IVPB        1 g 200 mL/hr over 30 Minutes Intravenous  Once 12/12/23 1512 12/12/23 2329   12/12/23 1030  meropenem  (MERREM ) 1 g in sodium chloride  0.9 % 100 mL IVPB  Status:  Discontinued        1 g 200 mL/hr over 30 Minutes Intravenous Every 8 hours 12/12/23 0923 12/12/23 0925   12/12/23 1030  meropenem  (MERREM ) 1 g in sodium chloride  0.9  % 100 mL IVPB  Status:  Discontinued        1 g 200 mL/hr over 30 Minutes Intravenous Every 8 hours 12/12/23 0925 12/12/23 1428   12/11/23 0200  cefTRIAXone  (ROCEPHIN ) 2 g in sodium chloride  0.9 % 100 mL IVPB  Status:  Discontinued        2 g 200 mL/hr over 30 Minutes Intravenous Every 24 hours 12/11/23 0013 12/12/23 0923   12/11/23 0000  vancomycin  (VANCOREADY) IVPB 1500 mg/300 mL  Status:  Discontinued        1,500 mg 150 mL/hr over 120 Minutes Intravenous Every 24 hours 12/10/23 0302 12/11/23 0013   12/10/23 1000  metroNIDAZOLE  (FLAGYL ) IVPB 500 mg  Status:  Discontinued        500 mg 100 mL/hr over 60 Minutes Intravenous Every 12 hours 12/10/23 0246 12/10/23 1053   12/10/23 1000  ceFEPIme  (MAXIPIME ) 2 g in sodium chloride  0.9 % 100 mL IVPB  Status:  Discontinued        2 g 200 mL/hr over 30 Minutes Intravenous Every 12 hours 12/10/23 0302 12/10/23 0921   12/10/23 1000  ceFEPIme  (MAXIPIME ) 2 g in sodium chloride  0.9 % 100 mL IVPB  Status:  Discontinued        2 g 200 mL/hr over 30 Minutes Intravenous Every 8 hours 12/10/23 0921 12/11/23 0013   12/10/23 0000  vancomycin  (VANCOCIN ) IVPB 1000 mg/200 mL premix       Placed in Followed by Linked Group   1,000 mg 200 mL/hr over 60 Minutes Intravenous  Once 12/09/23 2217 12/10/23 1026   12/09/23 2300  vancomycin  (VANCOCIN ) IVPB 1000 mg/200 mL premix       Placed in Followed by Linked Group   1,000 mg 200 mL/hr over 60 Minutes Intravenous  Once 12/09/23 2217 12/10/23 0008   12/09/23 2230  ceFEPIme  (MAXIPIME ) 2 g in sodium chloride  0.9 % 100 mL IVPB        2 g 200 mL/hr over 30 Minutes Intravenous  Once 12/09/23 2215 12/09/23 2308   12/09/23 2230  metroNIDAZOLE  (FLAGYL ) IVPB 500 mg        500 mg 100 mL/hr over 60 Minutes Intravenous  Once 12/09/23 2215 12/10/23 0008   12/09/23 2230  vancomycin  (VANCOCIN ) IVPB 1000 mg/200 mL premix  Status:  Discontinued        1,000 mg 200 mL/hr over 60 Minutes Intravenous  Once 12/09/23 2215 12/09/23  2216       Procedures: None  Consultants: None   Physical Exam:    12/14/2023    6:26 PM 12/14/2023    6:21 PM 12/14/2023   10:00 AM  Vitals with BMI  Weight   236 lbs 11 oz  BMI   30.38  Systolic 166 177 858  Diastolic 75 83 80  Pulse  82 79    General exam: awake, alert, no acute distress HEENT: hearing grossly normal, voice clear Respiratory system: CTAB, no wheezes, rales or rhonchi, normal respiratory effort. Cardiovascular system: normal S1/S2, RRR, no JVD, cap refill < 3 sec, 4+ RLE edema, no LLE edema Gastrointestinal system: soft abdomen Central nervous system: A&O x 4. no gross focal neurologic deficits, normal speech Extremities: RLE images below -- erythema persistent appears stable and unchanged from yesterday, left great toe amputation Skin: RLE image below Psychiatry: normal mood, congruent affect, judgement and insight appear normal                 Data Reviewed:  Labs & diagnostic studies as reviewed in detail above.   Family Communication: Wife present in the room during virtual encounter this morning.  Disposition: Status is: Inpatient Remains inpatient appropriate because: warrants close monitoring and further clinical improvement in cellulitis due to persistent chills and RLE erythema.   Planned Discharge Destination: Home    Time spent: 45 minutes  Author: Burnard DELENA Cunning, DO [facility]  08/24/2023 7:00 AM  For on call review [email or web address].   "

## 2023-12-14 NOTE — Progress Notes (Signed)
 Virtual visit with patient to supervise administration of synthroid . Pt alert and oriented, sitting in a chair. Pt in very good spirits,no complaints of pain. Reviewed POC and agreeable.

## 2023-12-14 NOTE — Progress Notes (Signed)
 This EMT went to pt's residence to obtain labs. Upon arrival pt greeted this EMT at the door. Pt's vitals were then accessed and documented in the chart. Labs were then drawn and transported to [facility]. Pt made aware that EMTP Hopkins was on her way for his morning H@H  visit.

## 2023-12-14 NOTE — Progress Notes (Signed)
 Video call completed with patient. Introduced myself as the CHARITY FUNDRAISER for the evening. Made patient aware that I am available all night via phone or tablet if any needs arise. Had patient take a BP with the current health equipment. Blood pressure was 170/79. Patient denies any chest pain, SOB, or headache. He does endorse slight dizziness while walking to the bathroom in home. No additional meds scheduled for the night. All questions answered prior to ending the call.

## 2023-12-14 NOTE — Progress Notes (Signed)
 Arrived to patient   he is alert and oriented   he states he feels good this morning   he is given his morning medication   his vitals are stable   pictures were taken and uploaded to [facility]   he stated that they have been changing his bandage daily    checked with virtual RN and she said to just check it and see if the bandage needs to be changed   there is no drainage noted   doctor states may get him restarted on his BP medications depending on what lab work looks like

## 2023-12-14 NOTE — TOC Progression Note (Signed)
 Transition of Care [facility]) - Progression Note    Patient Details  Name: [name] MRN: [id number] Date of Birth: 11/12/1950  Transition of [facility] CM/SW Contact  Corean JAYSON Canary, RN Phone Number: 12/14/2023, 4:42 PM  Clinical Narrative:     Patient discussed in IDR.  Patient has orthotics and wears shoes and socks most all the time.SABRA He ambulates with a cane He attended OP PT from [facility] prior to admission. PT has recommended resumption of these services upon discharge IPCM will continue to follow  Expected Discharge Plan: Home/Self Care Barriers to Discharge: Continued Medical Work up               Expected Discharge Plan and Services   Discharge Planning Services: CM Consult   Living arrangements for the past 2 months: Single Family Home                                       Social Drivers of Health (SDOH) Interventions SDOH Screenings   Food Insecurity: No Food Insecurity (12/10/2023)  Housing: Low Risk (12/10/2023)  Transportation Needs: No Transportation Needs (12/10/2023)  Utilities: Not At Risk (12/10/2023)  Social Connections: Moderately Isolated (12/10/2023)  Tobacco Use: Medium Risk (12/09/2023)    Readmission Risk Interventions    12/11/2023    3:35 PM  Readmission Risk Prevention Plan  Transportation Screening Complete  PCP or Specialist Appt within 5-7 Days Complete  Home Care Screening Complete  Medication Review (RN CM) Complete

## 2023-12-14 NOTE — Progress Notes (Signed)
 H@H  medics Reyes Aldaco and Barnie out to see patient for second visit. On arrival, we were met at the door by the patients wife who welcomed us  in. On assessment lung sounds clear and equal bilaterally. Pts right lower leg presents with +4 pitting edema. Redness is still present on the right  lower leg but looks better than it did on admission. Pt denies pain and has no other complaints. Medications were administered and verified against the [facility] with oversight from virtual RN. IV was flushed and maintained. No other tasks were needed at this time. Pt was left in care of himself and family. H@H  visit completed.

## 2023-12-15 DIAGNOSIS — M14671 Charcot's joint, right ankle and foot: Secondary | ICD-10-CM | POA: Diagnosis present

## 2023-12-15 LAB — CULTURE, BLOOD (ROUTINE X 2)
Culture: NO GROWTH
Special Requests: ADEQUATE

## 2023-12-15 MED ORDER — LEVOFLOXACIN 750 MG PO TABS: 750.0000 mg | ORAL_TABLET | Freq: Every day | ORAL | 0 refills | Status: AC

## 2023-12-15 MED ORDER — HYDROCHLOROTHIAZIDE 25 MG PO TABS: 25.0000 mg | ORAL_TABLET | Freq: Every evening | ORAL | 1 refills | Status: AC

## 2023-12-15 NOTE — Discharge Summary (Signed)
 "  [facility] Physician Discharge Summary   Patient: [name] MRN: [id number] DOB: 09-Oct-1950  Admit date:     12/09/2023  Discharge date: 12/15/2023  Discharge Physician: Burnard DELENA Cunning   PCP: Podraza, Cole Christopher, PA-C    Patient identified themself as [name]  DOB May 04, 1950  Medic  present in the home during encounter and performed the physical exam and assessment. Patient was seen today via video chat; my physical location [facility]   Recommendations at discharge:    Follow up with Primary Care in 1-2 weeks Repeat CBC, BMP in 1-2 weeks.  If hyperkalemia is recurrent, recommend transition off losartan  to alternate therapy.   Follow up on BP control and adjust regimen as needed. Follow up with Infectious Disease on 01/02/2024 Follow up with Podiatry as scheduled. Call if concerns with right heel wound in the meantime.  Discharge Diagnoses: Principal Problem:   Cellulitis of right leg Active Problems:   Sepsis (HCC)   Hypothyroidism   Diabetic peripheral neuropathy (HCC)   Hyperlipidemia   PAD (peripheral artery disease)   CAD (coronary artery disease)   Essential hypertension   Stage 3a chronic kidney disease (CKD) (HCC)   Normocytic anemia   Chronic heart failure with preserved ejection fraction (HFpEF) (HCC)   Edema of right lower extremity   Charcot joint of right foot  Resolved Problems:   * No resolved hospital problems. *  Hospital Course: 73 year old male with history of type 1 diabetes on insulin  pump, diabetic neuropathy/retinopathy, Charcot right foot, HFpEF, hypertension, hyperlipidemia, CKD stage IIIa, CAD status post CABG, aortic stenosis status post bioprosthetic aortic valve replacement in 2017, right carotid stenosis status post stenting, CVA, GERD, hypothyroidism, OSA on CPAP, BPH.  He presented to [facility] ED on evening of 12/09/23 for evaluation of right lower extremity warmth and erythema, fever and chills. Recently  admitted to [facility] 10/20-10/23/2025 for sepsis secondary to right lower extremity cellulitis. He reported having associated chills and fever at home.   In the ED, pt met sepsis criteria with fever, tachycardia, leukocytosis. He was not hypotensive and lactic acid was normal.   Patient was admitted to the hospital for further evaluation and management of right lower extremity cellulitis as outlined in detail below. He was started initially on broad-spectrum IV antibiotics.    Assessment and Plan:  Sepsis secondary to right lower EXTR cellulitis/gram negative bacteremia: Recently admitted for the same.  Presented with fever, chills, erythema, warmth of right lower extremity.  Has chronic right lower extremity edema.  Venous Doppler did not show any DVT.  X-ray right leg negative for osteomyelitis.     Blood cultures showed E. coli resistant to ceftriaxone .  Source of infection is most likely cellulitis.   12/9 -- Right lower leg extremity looks much better.  Leukocytosis much better .  Remains afebrile.  Antibiotic changed to meropenem  today.  Case was discussed with ID pharmacist Damien Quiet with plan to transition to PO Levaquin  x 6 day course to complete treatment.   12/12 PLAN : -- Continue PO Levaquin  x 9 more days --ID clinic follow up scheduled as above --Follow up with Podiatry --Elevate leg --Wound care to right heel ulcer   AKI in CKD stage III: Baseline creatinine around 1.1.  Initial Cr on admission 1.58 >> 1.32 >> 1.22. AKI Resolved. --Monitor BMP at follow up --Losartan  and hydrochlorothiazide  were held, now resumed    Chronic HFpEF: Last echo on 10/11/2023 showed EF of  60 to 65%, grade 2 diastolic dysfunction. Overall euvolemic on exam with exception of RLE edema with cellulitis.  No LLE edema on exam, no dyspnea reported. --Monitor volume status   Type 1 diabetes: Recent A1c of 7.   --Continue with home insulin  pump use    Hypertension: BP's have been  stable, mildly elevated this AM 141/80.SABRA   --Resume home losartan , hydrochlorothiazide   --Monitor BP closely   Hyperlipidemia: On Repatha  injection   History of coronary artery disease/carotid stenosis/CVA: Status post CABG.  Had a stent for carotid artery stenosis.  Stable with no active or recent chest pain reported. --Continue aspirin , Plavix    GERD: Continue PPI   Hypothyroidism: Continue Synthyroid   OSA: On CPAP at night   History of BPH: Continue Flomax .   Normocytic  anemia: Currently hemoglobin stable.  Last Hbg 10.5 --Monitor CBC at follow up   Debility/deconditioning: Patient ambulates with the help of cane.  Has poor ambulatory function.  Evaluated by PT in the hospital - recommended resuming outpatient PT after d/c. --Encourage mobility    Obesity: BMI of 30.5 Complicates overall care and prognosis.  Recommend lifestyle modifications including physical activity and diet for weight loss and overall long-term health.           Consultants: None Procedures performed: none  Disposition: Home Diet recommendation:  Heart healthy & carb modified  DISCHARGE MEDICATION: Allergies as of 12/15/2023       Reactions   Crestor  [rosuvastatin  Calcium ] Other (See Comments)   Myalgias Lethargy   Statins Other (See Comments)   Problem with higher dosages (Lipitor caused memory issues) Myalgias Lethargy   Lipitor [atorvastatin ] Other (See Comments)   Myalgais  Lethargy  Memory issues, brain fog   Tape Rash, Other (See Comments)   Adhesive tape.  (Paper tape OK)        Medication List     STOP taking these medications    collagenase  250 UNIT/GM ointment Commonly known as: SANTYL        TAKE these medications    acetaminophen  500 MG tablet Commonly known as: TYLENOL  Take 1,000 mg by mouth 2 (two) times daily as needed for headache, fever or moderate pain (pain score 4-6).   aspirin  EC 81 MG tablet Take 81 mg by mouth every evening.   cetirizine 10  MG tablet Commonly known as: ZYRTEC Take 10 mg by mouth every evening.   clopidogrel  75 MG tablet Commonly known as: PLAVIX  Take 1 tablet (75 mg total) by mouth daily at 12 noon. What changed: when to take this   clotrimazole -betamethasone  cream Commonly known as: LOTRISONE  Apply 1 Application topically 2 (two) times daily as needed (skin irritation).   Feverfew 100 MG Caps Take 1 capsule by mouth daily.   hydrochlorothiazide  25 MG tablet Commonly known as: HYDRODIURIL  Take 1 tablet (25 mg total) by mouth every evening. What changed:  medication strength how much to take   insulin  lispro 100 UNIT/ML injection Commonly known as: HumaLOG  Use 60 units via insulin  pump. What changed:  how much to take how to take this when to take this additional instructions   levofloxacin  750 MG tablet Commonly known as: LEVAQUIN  Take 1 tablet (750 mg total) by mouth daily for 9 days.   levothyroxine  137 MCG tablet Commonly known as: SYNTHROID  Take 137 mcg by mouth daily before breakfast.   loperamide  2 MG tablet Commonly known as: IMODIUM  A-D Take 2 mg by mouth daily.   losartan  25 MG tablet Commonly known as:  COZAAR  Take 25 mg by mouth daily.   omeprazole  20 MG capsule Commonly known as: PRILOSEC  Take 20 mg by mouth daily.   ondansetron  4 MG tablet Commonly known as: ZOFRAN  Take 1 tablet (4 mg total) by mouth every 6 (six) hours as needed for nausea.   One-A-Day Mens 50+ Tabs Take 1 tablet by mouth every evening.   Repatha  SureClick 140 MG/ML Soaj Generic drug: Evolocumab  INJECT 140 MG INTO THE SKIN EVERY 14 (FOURTEEN) DAYS.   tamsulosin  0.4 MG Caps capsule Commonly known as: FLOMAX  Take 0.4 mg by mouth every evening.               Discharge Care Instructions  (From admission, onward)           Start     Ordered   12/15/23 0000  Discharge wound care:       Comments: Continue any wound care for your right heel as instructed by your Podiatrist and  monitor closely for worsening - drainage, pain, increasing size.   12/15/23 0907            Discharge Exam: Filed Weights   12/09/23 2206 12/13/23 1100 12/14/23 1000  Weight: 108 kg 105.3 kg 107.4 kg   General exam: awake, alert, no acute distress HEENT: voice clear hearing grossly normal  Respiratory system: no coversational dyspnea, normal respiratory effort. Central nervous system: A&O x3. no gross focal neurologic deficits, normal speech Extremities: photos below Skin: photos below Psychiatry: normal mood, congruent affect, judgement and insight appear normal         Condition at discharge: stable  The results of significant diagnostics from this hospitalization (including imaging, microbiology, ancillary and laboratory) are listed below for reference.   Imaging Studies: VAS US  LOWER EXTREMITY VENOUS (DVT) Result Date: 12/10/2023  Lower Venous DVT Study Patient Name:  HARLEE PURSIFULL  Date of Exam:   12/10/2023 Medical Rec #: [id number]         Accession #:    [id number] Date of Birth: Nov 02, 1950         Patient Gender: M Patient Age:   73 years Exam Location:  [facility] Procedure:      VAS US  LOWER EXTREMITY VENOUS (DVT) Referring Phys: EDITHA RATHORE --------------------------------------------------------------------------------  Indications: Edema, Pain, and Recurrent cellulitis and sepsis.  Risk Factors: Patient with known history of PAD. Not amenable to to open or endovascular treatment therapies. Limitations: Significant calf edema, poor ultrasound/tissue interface and body habitus. Comparison Study: Prior negative right LEV done 10/23/23 and 10/18/21 Performing Technologist: Alberta Lis RVS  Examination Guidelines: A complete evaluation includes B-mode imaging, spectral Doppler, color Doppler, and power Doppler as needed of all accessible portions of each vessel. Bilateral testing is considered an integral part of a complete examination. Limited  examinations for reoccurring indications may be performed as noted. The reflux portion of the exam is performed with the patient in reverse Trendelenburg.  +---------+---------------+---------+-----------+----------+-------------------+ RIGHT    CompressibilityPhasicitySpontaneityPropertiesThrombus Aging      +---------+---------------+---------+-----------+----------+-------------------+ CFV      Full           Yes      Yes                                      +---------+---------------+---------+-----------+----------+-------------------+ SFJ      Full                                                             +---------+---------------+---------+-----------+----------+-------------------+  FV Prox  Full           Yes      Yes                                      +---------+---------------+---------+-----------+----------+-------------------+ FV Mid   Full                                                             +---------+---------------+---------+-----------+----------+-------------------+ FV DistalFull           Yes      Yes                                      +---------+---------------+---------+-----------+----------+-------------------+ PFV      Full                                                             +---------+---------------+---------+-----------+----------+-------------------+ POP      Full           Yes      Yes                                      +---------+---------------+---------+-----------+----------+-------------------+ PTV                                                   Not well visualized +---------+---------------+---------+-----------+----------+-------------------+ PERO                                                  Not well visualized +---------+---------------+---------+-----------+----------+-------------------+ Gastroc  Full                                                              +---------+---------------+---------+-----------+----------+-------------------+   +----+---------------+---------+-----------+----------+--------------+ LEFTCompressibilityPhasicitySpontaneityPropertiesThrombus Aging +----+---------------+---------+-----------+----------+--------------+ CFV Full           Yes      Yes                                 +----+---------------+---------+-----------+----------+--------------+ SFJ Full                                                        +----+---------------+---------+-----------+----------+--------------+  Summary: RIGHT: - There is no evidence of deep vein thrombosis in the lower extremity. However, portions of this examination were limited- see technologist comments above.  - No cystic structure found in the popliteal fossa. - Ultrasound characteristics of enlarged lymph nodes are noted in the groin. Subcutaneous edema noted throughout calf  LEFT: - No evidence of common femoral vein obstruction.   *See table(s) above for measurements and observations. Electronically signed by Debby Robertson on 12/10/2023 at 8:39:25 PM.    Final    DG Tibia/Fibula Right Result Date: 12/09/2023 EXAM: XR TIBIA AND FIBULA, 2 VIEW(S) 12/09/2023 10:50:00 PM COMPARISON: None available. CLINICAL HISTORY: swelling, infection FINDINGS: BONES AND JOINTS: Tibial intramedullary nail with proximal and distal locking screw fixation intact. Healed tibial mid-diaphyseal fracture. Chondrocalcinosis of the knee. No malalignment. SOFT TISSUES: Diffuse subcutaneous edema. Vascular calcifications. IMPRESSION: 1. Healed tibial mid-diaphyseal fracture with intact intramedullary nail and locking screw fixation. 2. Diffuse subcutaneous edema. 3. Chondrocalcinosis of the knee. Electronically signed by: Dorethia Molt MD 12/09/2023 10:58 PM EST RP Workstation: [id number]   DG Foot Complete Right Result Date: 12/09/2023 EXAM: 3 OR MORE VIEW(S) XRAY OF THE _LATERALITY_ FOOT 12/09/2023  10:50:00 PM COMPARISON: 10/23/2023 CLINICAL HISTORY: Questionable sepsis - evaluate for abnormality FINDINGS: BONES AND JOINTS: Prior ankle arthrodesis with intact hardware. 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 Talonavicular dislocation and midfoot osteoarthritis. SOFT TISSUES: Soft tissue ulcer posterior to the calcaneus. Diffuse soft tissue swelling. No subjacent osseous erosion to suggest osteomyelitis. Vascular calcifications noted. IMPRESSION: 1. Soft tissue ulcer posterior to the calcaneus with diffuse soft tissue swelling. No subjacent osseous erosion to suggest osteomyelitis. 2. Talonavicular dislocation and midfoot osteoarthritis. 3. 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 4. Prior ankle arthrodesis with intact visualized  hardware. Electronically signed by: Dorethia Molt MD 12/09/2023 10:57 PM EST RP Workstation: [id number]   DG Chest Port 1 View Result Date: 12/09/2023 EXAM: 1 VIEW(S) XRAY OF THE CHEST 12/09/2023 10:50:12 PM COMPARISON: 10/23/2023 CLINICAL HISTORY: Questionable sepsis - evaluate for abnormality FINDINGS: LINES, TUBES AND DEVICES: Surgical clips in lower neck. LUNGS AND PLEURA: Bibasilar atelectasis. No pleural effusion. No pneumothorax. HEART AND MEDIASTINUM: Cardiomegaly. Status post coronary artery bypass grafting. BONES AND SOFT TISSUES: No acute osseous abnormality. IMPRESSION: 1. No acute cardiopulmonary abnormality. 2. Cardiomegaly. 3. Status post coronary artery bypass grafting. Electronically signed by: Dorethia Molt MD 12/09/2023 10:55 PM EST RP Workstation: [id number]    Microbiology: Results for orders placed or performed during the hospital encounter of 12/09/23  Blood Culture (routine x 2)     Status: Abnormal   Collection Time: 12/09/23 10:25 PM   Specimen: BLOOD  Result Value Ref  Range Status   Specimen Description   Final    BLOOD RIGHT ANTECUBITAL Performed at [facility], [address] [place], [place] [address]    Special Requests   Final    BOTTLES DRAWN AEROBIC AND ANAEROBIC Blood Culture adequate volume Performed at [facility], [address] [place], [place] [address]    Culture  Setup Time   Final    GRAM NEGATIVE RODS IN BOTH AEROBIC AND ANAEROBIC BOTTLES RBV MITCHELL PHARM D 12/10/2023 @ 2310 BY DD Performed at [facility], [address] [place], [place] [address]    Culture ESCHERICHIA COLI (A)  Final   Report Status 12/12/2023 FINAL  Final   Organism ID, Bacteria ESCHERICHIA COLI  Final      Susceptibility   Escherichia coli - MIC*    AMPICILLIN  >=32 RESISTANT Resistant     CEFAZOLIN  (NON-URINE) >=32 RESISTANT Resistant     CEFEPIME  <=0.12 SENSITIVE Sensitive     ERTAPENEM  <=0.12 SENSITIVE Sensitive     CEFTRIAXONE  4 RESISTANT Resistant     CIPROFLOXACIN <=0.06 SENSITIVE Sensitive     GENTAMICIN <=1 SENSITIVE Sensitive     MEROPENEM  <=0.25 SENSITIVE Sensitive     TRIMETH/SULFA <=20 SENSITIVE Sensitive     AMPICILLIN /SULBACTAM 16 INTERMEDIATE Intermediate     PIP/TAZO Value in next row Sensitive      <=4 SENSITIVEThis is a modified FDA-approved test that has been validated and its performance characteristics determined by the reporting laboratory.  This laboratory is certified under the Clinical Laboratory Improvement Amendments CLIA as qualified to perform high complexity clinical laboratory testing.    * ESCHERICHIA COLI  Blood Culture ID Panel (Reflexed)     Status: Abnormal   Collection Time: 12/09/23 10:25 PM  Result Value Ref Range Status   Enterococcus faecalis NOT DETECTED NOT DETECTED Final   Enterococcus Faecium NOT DETECTED NOT DETECTED Final   Listeria monocytogenes NOT DETECTED NOT DETECTED Final   Staphylococcus species NOT DETECTED NOT DETECTED Final   Staphylococcus  aureus (BCID) NOT DETECTED NOT DETECTED Final   Staphylococcus epidermidis NOT DETECTED NOT DETECTED Final   Staphylococcus lugdunensis NOT DETECTED NOT DETECTED Final   Streptococcus species NOT DETECTED NOT DETECTED Final   Streptococcus agalactiae NOT DETECTED NOT DETECTED Final   Streptococcus pneumoniae NOT DETECTED NOT DETECTED Final   Streptococcus pyogenes NOT DETECTED NOT DETECTED Final   A.calcoaceticus-baumannii NOT DETECTED NOT DETECTED Final   Bacteroides fragilis NOT DETECTED NOT DETECTED Final   Enterobacterales DETECTED (A) NOT DETECTED Corrected    Comment: Enterobacterales represent a large order of gram negative bacteria, not a single organism. CRITICAL RESULT CALLED TO, READ BACK BY AND VERIFIED WITH: RBV MITCHELL PHARM D 12/10/2023 @ 2310 BY DD CORRECTED ON 12/07 AT 2331: PREVIOUSLY REPORTED AS DETECTED Enterobacterales represent a large order of gram negative bacteria, not a single organism. CRITICAL RESULT CALLED TO, READ BACK BY AND VERIFIED WITH: RBV ATOOSA RN 12/10/2023 @ 2308 BY DD    Enterobacter cloacae complex NOT DETECTED NOT DETECTED Final   Escherichia coli DETECTED (A) NOT DETECTED Corrected    Comment: CRITICAL RESULT CALLED TO, READ BACK BY AND VERIFIED WITH: RBV MITCHELL PHARM D 12/10/2023 @ 2310 BY DD CORRECTED ON 12/07 AT 2331: PREVIOUSLY REPORTED AS DETECTED CRITICAL RESULT CALLED TO, READ BACK BY AND VERIFIED WITH: RBV ATOOSA RN 12/10/2023 @ 2308 BY DD    Klebsiella aerogenes NOT DETECTED NOT DETECTED Final   Klebsiella oxytoca NOT DETECTED NOT DETECTED Final   Klebsiella pneumoniae NOT DETECTED NOT DETECTED Final   Proteus species NOT DETECTED NOT DETECTED Final   Salmonella species NOT DETECTED NOT DETECTED Final   Serratia marcescens NOT DETECTED NOT DETECTED Final   Haemophilus influenzae NOT DETECTED NOT DETECTED Final   Neisseria meningitidis NOT DETECTED NOT DETECTED Final   Pseudomonas aeruginosa NOT DETECTED NOT DETECTED Final    Stenotrophomonas maltophilia NOT DETECTED NOT DETECTED Final   Candida albicans NOT DETECTED NOT DETECTED Final   Candida auris NOT DETECTED NOT DETECTED Final   Candida glabrata NOT DETECTED NOT DETECTED Final   Candida krusei NOT DETECTED NOT DETECTED Final   Candida parapsilosis NOT DETECTED NOT DETECTED Final  Candida tropicalis NOT DETECTED NOT DETECTED Final   Cryptococcus neoformans/gattii NOT DETECTED NOT DETECTED Final   CTX-M ESBL NOT DETECTED NOT DETECTED Final   Carbapenem resistance IMP NOT DETECTED NOT DETECTED Final   Carbapenem resistance KPC NOT DETECTED NOT DETECTED Final   Carbapenem resistance NDM NOT DETECTED NOT DETECTED Final   Carbapenem resist OXA 48 LIKE NOT DETECTED NOT DETECTED Final   Carbapenem resistance VIM NOT DETECTED NOT DETECTED Final    Comment: Performed at [facility], [address], [place], [place] [address]  Resp panel by RT-PCR (RSV, Flu A&B, Covid) Anterior Nasal Swab     Status: None   Collection Time: 12/09/23 10:27 PM   Specimen: Anterior Nasal Swab  Result Value Ref Range Status   SARS Coronavirus 2 by RT PCR NEGATIVE NEGATIVE Final    Comment: (NOTE) SARS-CoV-2 target nucleic acids are NOT DETECTED.  The SARS-CoV-2 RNA is generally detectable in upper respiratory specimens during the acute phase of infection. The lowest concentration of SARS-CoV-2 viral copies this assay can detect is 138 copies/mL. A negative result does not preclude SARS-Cov-2 infection and should not be used as the sole basis for treatment or other patient management decisions. A negative result may occur with  improper specimen collection/handling, submission of specimen other than nasopharyngeal swab, presence of viral mutation(s) within the areas targeted by this assay, and inadequate number of viral copies(<138 copies/mL). A negative result must be combined with clinical observations, patient history, and epidemiological information. The expected  result is Negative.  Fact Sheet for Patients:  bloggercourse.com  Fact Sheet for Healthcare Providers:  seriousbroker.it  This test is no t yet approved or cleared by the [place]  FDA and  has been authorized for detection and/or diagnosis of SARS-CoV-2 by FDA under an Emergency Use Authorization (EUA). This EUA will remain  in effect (meaning this test can be used) for the duration of the COVID-19 declaration under Section 564(b)(1) of the Act, 21 U.S.C.section 360bbb-3(b)(1), unless the authorization is terminated  or revoked sooner.       Influenza A by PCR NEGATIVE NEGATIVE Final   Influenza B by PCR NEGATIVE NEGATIVE Final    Comment: (NOTE) The Xpert Xpress SARS-CoV-2/FLU/RSV plus assay is intended as an aid in the diagnosis of influenza from Nasopharyngeal swab specimens and should not be used as a sole basis for treatment. Nasal washings and aspirates are unacceptable for Xpert Xpress SARS-CoV-2/FLU/RSV testing.  Fact Sheet for Patients: bloggercourse.com  Fact Sheet for Healthcare Providers: seriousbroker.it  This test is not yet approved or cleared by the [place]  FDA and has been authorized for detection and/or diagnosis of SARS-CoV-2 by FDA under an Emergency Use Authorization (EUA). This EUA will remain in effect (meaning this test can be used) for the duration of the COVID-19 declaration under Section 564(b)(1) of the Act, 21 U.S.C. section 360bbb-3(b)(1), unless the authorization is terminated or revoked.     Resp Syncytial Virus by PCR NEGATIVE NEGATIVE Final    Comment: (NOTE) Fact Sheet for Patients: bloggercourse.com  Fact Sheet for Healthcare Providers: seriousbroker.it  This test is not yet approved or cleared by the [place]  FDA and has been authorized for detection and/or diagnosis of  SARS-CoV-2 by FDA under an Emergency Use Authorization (EUA). This EUA will remain in effect (meaning this test can be used) for the duration of the COVID-19 declaration under Section 564(b)(1) of the Act, 21 U.S.C. section 360bbb-3(b)(1), unless the authorization is terminated or revoked.  Performed at [facility], [address] [place], [place] [address]   Blood Culture (routine x 2)     Status: None   Collection Time: 12/09/23 10:30 PM   Specimen: BLOOD LEFT FOREARM  Result Value Ref Range Status   Specimen Description   Final    BLOOD LEFT FOREARM Performed at [facility], [address], [place], [place] [address]    Special Requests   Final    BOTTLES DRAWN AEROBIC AND ANAEROBIC Blood Culture adequate volume Performed at [facility], [address] [place], [place] [address]    Culture   Final    NO GROWTH 5 DAYS Performed at [facility], [address], [place], [place] [address]    Report Status 12/15/2023 FINAL  Final    Labs: CBC: Recent Labs  Lab 12/09/23 2227 12/10/23 0523 12/11/23 0513 12/14/23 1011  WBC 15.2* 18.6* 10.6* 6.7  NEUTROABS 13.7*  --   --   --   HGB 12.5* 11.2* 10.5* 12.2*  HCT 36.6* 33.3* 32.2* 35.8*  MCV 90.4 92.2 93.9 91.8  PLT 204 177 153 210   Basic Metabolic Panel: Recent Labs  Lab 12/09/23 2227 12/10/23 0523 12/11/23 0513 12/14/23 1011  NA 138 136 137 135  K 4.4 4.5 4.3 5.0  CL 102 104 105 102  CO2 25 23 22 28   GLUCOSE 200* 178* 127* 197*  BUN 34* 34* 28* 15  CREATININE 1.58* 1.32* 1.22 1.07  CALCIUM  9.0 7.9* 8.3* 8.4*   Liver Function Tests: Recent Labs  Lab 12/09/23 2227  AST 28  ALT 17  ALKPHOS 116  BILITOT 0.5  PROT 7.4  ALBUMIN  4.1   CBG: Recent Labs  Lab 12/11/23 1657 12/11/23 2104 12/12/23 0303 12/12/23 0731 12/12/23 1131  GLUCAP 264* 196* 148* 152* 151*    Discharge time spent: greater than 30 minutes.  Signed: Burnard DELENA Cunning, DO [facility] 12/15/2023 "

## 2023-12-15 NOTE — Progress Notes (Signed)
 PT Cancellation Note  Patient Details Name: [name] MRN: [id number] DOB: 11-09-1950   Cancelled Treatment:    Reason Eval/Treat Not Completed: Other (comment)  Spoke with Tax Inspector. Had planned for PT follow-up this afternoon but pt d/c from program at this time. Planning to continue with OPPT. Therapy remains available for any needs, just let us  know if pt has any further concerns while admitted to program and we will promptly follow-up.  Leontine Roads, PT, DPT [facility] Physical Therapist Office: [phone] Website: [email or web address]   Leontine GORMAN Roads 12/15/2023, 11:32 AM

## 2023-12-15 NOTE — Progress Notes (Signed)
 Communicated with patient via video tablet. Patient appears comfortable in the chair. PIV removed. AVS paperwork given to the patient. Discharge instructions discussed. Patients question answered to satisfaction. Patient agreeable with discharge plan.

## 2023-12-15 NOTE — Plan of Care (Signed)

## 2024-01-02 ENCOUNTER — Ambulatory Visit: Payer: Self-pay | Admitting: Infectious Diseases

## 2024-02-03 ENCOUNTER — Other Ambulatory Visit: Payer: Self-pay | Admitting: Internal Medicine

## 2024-02-03 DIAGNOSIS — Z951 Presence of aortocoronary bypass graft: Secondary | ICD-10-CM

## 2024-02-03 DIAGNOSIS — I739 Peripheral vascular disease, unspecified: Secondary | ICD-10-CM

## 2024-02-03 DIAGNOSIS — E785 Hyperlipidemia, unspecified: Secondary | ICD-10-CM

## 2024-02-03 DIAGNOSIS — M791 Myalgia, unspecified site: Secondary | ICD-10-CM
